# Patient Record
Sex: Female | Born: 1965 | Race: White | Hispanic: No | Marital: Single | Smoking: Current every day smoker
Health system: Southern US, Community
[De-identification: ages and names within clinical notes are randomized; demographics above are authoritative.]

## PROBLEM LIST (undated history)

## (undated) DIAGNOSIS — Z7901 Long term (current) use of anticoagulants: Secondary | ICD-10-CM

## (undated) DIAGNOSIS — J189 Pneumonia, unspecified organism: Secondary | ICD-10-CM

## (undated) DIAGNOSIS — I4892 Unspecified atrial flutter: Secondary | ICD-10-CM

## (undated) DIAGNOSIS — K219 Gastro-esophageal reflux disease without esophagitis: Secondary | ICD-10-CM

## (undated) DIAGNOSIS — J449 Chronic obstructive pulmonary disease, unspecified: Secondary | ICD-10-CM

## (undated) DIAGNOSIS — I495 Sick sinus syndrome: Secondary | ICD-10-CM

## (undated) DIAGNOSIS — I2510 Atherosclerotic heart disease of native coronary artery without angina pectoris: Secondary | ICD-10-CM

## (undated) DIAGNOSIS — Z9114 Patient's other noncompliance with medication regimen: Secondary | ICD-10-CM

## (undated) DIAGNOSIS — Z91148 Patient's other noncompliance with medication regimen for other reason: Secondary | ICD-10-CM

## (undated) DIAGNOSIS — E039 Hypothyroidism, unspecified: Secondary | ICD-10-CM

## (undated) DIAGNOSIS — K7460 Unspecified cirrhosis of liver: Secondary | ICD-10-CM

## (undated) DIAGNOSIS — R51 Headache: Secondary | ICD-10-CM

## (undated) DIAGNOSIS — E119 Type 2 diabetes mellitus without complications: Secondary | ICD-10-CM

## (undated) DIAGNOSIS — G8929 Other chronic pain: Secondary | ICD-10-CM

## (undated) DIAGNOSIS — I4891 Unspecified atrial fibrillation: Secondary | ICD-10-CM

## (undated) DIAGNOSIS — F419 Anxiety disorder, unspecified: Secondary | ICD-10-CM

## (undated) DIAGNOSIS — I2699 Other pulmonary embolism without acute cor pulmonale: Secondary | ICD-10-CM

## (undated) DIAGNOSIS — I429 Cardiomyopathy, unspecified: Secondary | ICD-10-CM

## (undated) DIAGNOSIS — B1920 Unspecified viral hepatitis C without hepatic coma: Secondary | ICD-10-CM

## (undated) HISTORY — PX: CHOLECYSTECTOMY: SHX55

## (undated) HISTORY — PX: CARDIAC ELECTROPHYSIOLOGY STUDY AND ABLATION: SHX1294

## (undated) HISTORY — PX: INSERT / REPLACE / REMOVE PACEMAKER: SUR710

---

## 2002-08-14 ENCOUNTER — Emergency Department (HOSPITAL_COMMUNITY): Admission: EM | Admit: 2002-08-14 | Discharge: 2002-08-14 | Payer: Self-pay | Admitting: Emergency Medicine

## 2008-12-07 HISTORY — PX: ESOPHAGOGASTRODUODENOSCOPY: SHX1529

## 2008-12-24 ENCOUNTER — Inpatient Hospital Stay (HOSPITAL_COMMUNITY): Admission: AD | Admit: 2008-12-24 | Discharge: 2008-12-28 | Payer: Self-pay | Admitting: Cardiology

## 2008-12-24 ENCOUNTER — Encounter: Payer: Self-pay | Admitting: Emergency Medicine

## 2008-12-24 ENCOUNTER — Ambulatory Visit: Payer: Self-pay | Admitting: Cardiology

## 2008-12-25 ENCOUNTER — Encounter: Payer: Self-pay | Admitting: Cardiology

## 2009-01-02 ENCOUNTER — Inpatient Hospital Stay (HOSPITAL_COMMUNITY): Admission: EM | Admit: 2009-01-02 | Discharge: 2009-01-09 | Payer: Self-pay | Admitting: Emergency Medicine

## 2009-01-02 ENCOUNTER — Ambulatory Visit: Payer: Self-pay | Admitting: Cardiology

## 2009-01-05 ENCOUNTER — Ambulatory Visit: Payer: Self-pay | Admitting: Gastroenterology

## 2009-01-06 ENCOUNTER — Encounter: Payer: Self-pay | Admitting: Gastroenterology

## 2009-01-14 ENCOUNTER — Emergency Department (HOSPITAL_COMMUNITY): Admission: EM | Admit: 2009-01-14 | Discharge: 2009-01-14 | Payer: Self-pay | Admitting: Emergency Medicine

## 2009-01-15 ENCOUNTER — Ambulatory Visit: Payer: Self-pay | Admitting: Cardiology

## 2009-01-15 ENCOUNTER — Encounter (INDEPENDENT_AMBULATORY_CARE_PROVIDER_SITE_OTHER): Payer: Self-pay | Admitting: Family Medicine

## 2009-01-15 ENCOUNTER — Inpatient Hospital Stay (HOSPITAL_COMMUNITY): Admission: EM | Admit: 2009-01-15 | Discharge: 2009-01-22 | Payer: Self-pay | Admitting: Emergency Medicine

## 2009-01-26 ENCOUNTER — Inpatient Hospital Stay (HOSPITAL_COMMUNITY): Admission: EM | Admit: 2009-01-26 | Discharge: 2009-01-29 | Payer: Self-pay | Admitting: Emergency Medicine

## 2009-07-22 ENCOUNTER — Encounter: Payer: Self-pay | Admitting: *Deleted

## 2009-09-26 ENCOUNTER — Encounter: Payer: Self-pay | Admitting: Cardiology

## 2011-03-23 LAB — POCT CARDIAC MARKERS
CKMB, poc: 2.4 ng/mL (ref 1.0–8.0)
CKMB, poc: 4 ng/mL (ref 1.0–8.0)
Myoglobin, poc: 54.8 ng/mL (ref 12–200)
Troponin i, poc: 0.11 ng/mL — ABNORMAL HIGH (ref 0.00–0.09)
Troponin i, poc: 0.11 ng/mL — ABNORMAL HIGH (ref 0.00–0.09)
Troponin i, poc: 0.11 ng/mL — ABNORMAL HIGH (ref 0.00–0.09)

## 2011-03-23 LAB — BASIC METABOLIC PANEL
BUN: 12 mg/dL (ref 6–23)
BUN: 3 mg/dL — ABNORMAL LOW (ref 6–23)
BUN: 5 mg/dL — ABNORMAL LOW (ref 6–23)
BUN: 7 mg/dL (ref 6–23)
CO2: 23 mEq/L (ref 19–32)
CO2: 25 mEq/L (ref 19–32)
CO2: 27 mEq/L (ref 19–32)
Calcium: 8.4 mg/dL (ref 8.4–10.5)
Calcium: 8.5 mg/dL (ref 8.4–10.5)
Calcium: 8.6 mg/dL (ref 8.4–10.5)
Chloride: 102 mEq/L (ref 96–112)
Chloride: 103 mEq/L (ref 96–112)
Chloride: 105 mEq/L (ref 96–112)
Chloride: 106 mEq/L (ref 96–112)
Chloride: 98 mEq/L (ref 96–112)
Creatinine, Ser: 0.83 mg/dL (ref 0.4–1.2)
Creatinine, Ser: 0.93 mg/dL (ref 0.4–1.2)
Creatinine, Ser: 1.02 mg/dL (ref 0.4–1.2)
GFR calc Af Amer: >60 mL/min (ref >60)
GFR calc Af Amer: >60 mL/min (ref >60)
GFR calc Af Amer: >60 mL/min (ref >60)
GFR calc Af Amer: >60 mL/min (ref >60)
GFR calc Af Amer: >60 mL/min (ref >60)
GFR calc non Af Amer: 59 mL/min — ABNORMAL LOW (ref >60)
GFR calc non Af Amer: >60 mL/min (ref >60)
GFR calc non Af Amer: >60 mL/min (ref >60)
GFR calc non Af Amer: >60 mL/min (ref >60)
GFR calc non Af Amer: >60 mL/min (ref >60)
Glucose, Bld: 92 mg/dL (ref 70–99)
Glucose, Bld: 93 mg/dL (ref 70–99)
Glucose, Bld: 94 mg/dL (ref 70–99)
Potassium: 3.4 mEq/L — ABNORMAL LOW (ref 3.5–5.1)
Potassium: 3.9 mEq/L (ref 3.5–5.1)
Potassium: 3.9 mEq/L (ref 3.5–5.1)
Potassium: 4 mEq/L (ref 3.5–5.1)
Potassium: 4 mEq/L (ref 3.5–5.1)
Sodium: 137 mEq/L (ref 135–145)
Sodium: 138 mEq/L (ref 135–145)
Sodium: 139 mEq/L (ref 135–145)

## 2011-03-23 LAB — CARDIAC PANEL(CRET KIN+CKTOT+MB+TROPI)
CK, MB: 5 ng/mL — ABNORMAL HIGH (ref 0.3–4.0)
CK, MB: 5.3 ng/mL — ABNORMAL HIGH (ref 0.3–4.0)
Relative Index: INVALID (ref 0.0–2.5)
Relative Index: INVALID (ref 0.0–2.5)
Total CK: 48 U/L (ref 7–177)
Total CK: 62 U/L (ref 7–177)
Total CK: 79 U/L (ref 7–177)
Troponin I: 0.13 ng/mL — ABNORMAL HIGH (ref 0.00–0.06)
Troponin I: 0.14 ng/mL — ABNORMAL HIGH (ref 0.00–0.06)

## 2011-03-23 LAB — LIPASE, BLOOD
Lipase: 22 U/L (ref 11–59)
Lipase: 38 U/L (ref 11–59)

## 2011-03-23 LAB — URINALYSIS, ROUTINE W REFLEX MICROSCOPIC
Hgb urine dipstick: NEGATIVE
Nitrite: POSITIVE — AB
Protein, ur: 30 mg/dL — AB
Specific Gravity, Urine: 1.031 — ABNORMAL HIGH (ref 1.005–1.030)
Urobilinogen, UA: 2 mg/dL — ABNORMAL HIGH (ref 0.0–1.0)

## 2011-03-23 LAB — DIFFERENTIAL
Basophils Absolute: 0.1 10*3/uL (ref 0.0–0.1)
Basophils Relative: 1 % (ref 0–1)
Eosinophils Absolute: 0 10*3/uL (ref 0.0–0.7)
Eosinophils Absolute: 0.1 10*3/uL (ref 0.0–0.7)
Eosinophils Relative: 0 % (ref 0–5)
Lymphocytes Relative: 18 % (ref 12–46)
Lymphocytes Relative: 31 % (ref 12–46)
Lymphs Abs: 2.3 10*3/uL (ref 0.7–4.0)
Lymphs Abs: 3.1 10*3/uL (ref 0.7–4.0)
Monocytes Absolute: 0.7 10*3/uL (ref 0.1–1.0)
Monocytes Relative: 7 % (ref 3–12)
Monocytes Relative: 8 % (ref 3–12)
Neutro Abs: 6.1 10*3/uL (ref 1.7–7.7)
Neutrophils Relative %: 61 % (ref 43–77)
Neutrophils Relative %: 73 % (ref 43–77)

## 2011-03-23 LAB — HEPATIC FUNCTION PANEL
ALT: 14 U/L (ref 0–35)
ALT: 42 U/L — ABNORMAL HIGH (ref 0–35)
AST: 18 U/L (ref 0–37)
Albumin: 3.3 g/dL — ABNORMAL LOW (ref 3.5–5.2)
Alkaline Phosphatase: 30 U/L — ABNORMAL LOW (ref 39–117)
Bilirubin, Direct: 0.4 mg/dL — ABNORMAL HIGH (ref 0.0–0.3)
Indirect Bilirubin: 0.4 mg/dL (ref 0.3–0.9)
Indirect Bilirubin: 0.8 mg/dL (ref 0.3–0.9)
Total Protein: 6.2 g/dL (ref 6.0–8.3)

## 2011-03-23 LAB — CBC
HCT: 35.8 % — ABNORMAL LOW (ref 36.0–46.0)
HCT: 35.8 % — ABNORMAL LOW (ref 36.0–46.0)
HCT: 38.2 % (ref 36.0–46.0)
HCT: 42.4 % (ref 36.0–46.0)
Hemoglobin: 11.8 g/dL — ABNORMAL LOW (ref 12.0–15.0)
Hemoglobin: 11.8 g/dL — ABNORMAL LOW (ref 12.0–15.0)
Hemoglobin: 11.9 g/dL — ABNORMAL LOW (ref 12.0–15.0)
Hemoglobin: 11.9 g/dL — ABNORMAL LOW (ref 12.0–15.0)
Hemoglobin: 12.5 g/dL (ref 12.0–15.0)
Hemoglobin: 13.9 g/dL (ref 12.0–15.0)
MCHC: 32.7 g/dL (ref 30.0–36.0)
MCHC: 32.7 g/dL (ref 30.0–36.0)
MCHC: 32.9 g/dL (ref 30.0–36.0)
MCHC: 32.9 g/dL (ref 30.0–36.0)
MCHC: 33.2 g/dL (ref 30.0–36.0)
MCHC: 33.3 g/dL (ref 30.0–36.0)
MCV: 100.2 fL — ABNORMAL HIGH (ref 78.0–100.0)
MCV: 100.8 fL — ABNORMAL HIGH (ref 78.0–100.0)
MCV: 101.4 fL — ABNORMAL HIGH (ref 78.0–100.0)
MCV: 101.4 fL — ABNORMAL HIGH (ref 78.0–100.0)
MCV: 101.6 fL — ABNORMAL HIGH (ref 78.0–100.0)
MCV: 101.6 fL — ABNORMAL HIGH (ref 78.0–100.0)
MCV: 101.9 fL — ABNORMAL HIGH (ref 78.0–100.0)
MCV: 102.4 fL — ABNORMAL HIGH (ref 78.0–100.0)
Platelets: 185 10*3/uL (ref 150–400)
Platelets: 190 10*3/uL (ref 150–400)
Platelets: 201 10*3/uL (ref 150–400)
Platelets: 230 10*3/uL (ref 150–400)
Platelets: 267 10*3/uL (ref 150–400)
Platelets: 280 10*3/uL (ref 150–400)
RBC: 3.52 MIL/uL — ABNORMAL LOW (ref 3.87–5.11)
RBC: 3.53 MIL/uL — ABNORMAL LOW (ref 3.87–5.11)
RBC: 3.53 MIL/uL — ABNORMAL LOW (ref 3.87–5.11)
RBC: 3.54 MIL/uL — ABNORMAL LOW (ref 3.87–5.11)
RBC: 3.76 MIL/uL — ABNORMAL LOW (ref 3.87–5.11)
RBC: 3.76 MIL/uL — ABNORMAL LOW (ref 3.87–5.11)
RBC: 4.2 MIL/uL (ref 3.87–5.11)
RDW: 15.3 % (ref 11.5–15.5)
RDW: 16 % — ABNORMAL HIGH (ref 11.5–15.5)
RDW: 16 % — ABNORMAL HIGH (ref 11.5–15.5)
RDW: 16.1 % — ABNORMAL HIGH (ref 11.5–15.5)
WBC: 10 10*3/uL (ref 4.0–10.5)
WBC: 12.9 10*3/uL — ABNORMAL HIGH (ref 4.0–10.5)
WBC: 6.4 10*3/uL (ref 4.0–10.5)
WBC: 6.9 10*3/uL (ref 4.0–10.5)
WBC: 8.2 10*3/uL (ref 4.0–10.5)
WBC: 8.4 10*3/uL (ref 4.0–10.5)
WBC: 8.4 10*3/uL (ref 4.0–10.5)
WBC: 8.8 10*3/uL (ref 4.0–10.5)

## 2011-03-23 LAB — BRAIN NATRIURETIC PEPTIDE
Pro B Natriuretic peptide (BNP): 242 pg/mL — ABNORMAL HIGH (ref 0.0–100.0)
Pro B Natriuretic peptide (BNP): 965 pg/mL — ABNORMAL HIGH (ref 0.0–100.0)

## 2011-03-23 LAB — PROTIME-INR
INR: 1.1 (ref 0.00–1.49)
INR: 1.1 (ref 0.00–1.49)
INR: 1.1 (ref 0.00–1.49)
INR: 1.2 (ref 0.00–1.49)
INR: 1.4 (ref 0.00–1.49)
INR: 1.6 — ABNORMAL HIGH (ref 0.00–1.49)
INR: 2.2 — ABNORMAL HIGH (ref 0.00–1.49)
Prothrombin Time: 14 seconds (ref 11.6–15.2)
Prothrombin Time: 15 s (ref 11.6–15.2)
Prothrombin Time: 16 seconds — ABNORMAL HIGH (ref 11.6–15.2)
Prothrombin Time: 17.5 seconds — ABNORMAL HIGH (ref 11.6–15.2)
Prothrombin Time: 25.4 seconds — ABNORMAL HIGH (ref 11.6–15.2)
Prothrombin Time: 25.9 seconds — ABNORMAL HIGH (ref 11.6–15.2)

## 2011-03-23 LAB — PLATELET COUNT: Platelets: 203 10*3/uL (ref 150–400)

## 2011-03-23 LAB — HEPARIN LEVEL (UNFRACTIONATED)
Heparin Unfractionated: 0.23 IU/mL — ABNORMAL LOW (ref 0.30–0.70)
Heparin Unfractionated: 0.29 IU/mL — ABNORMAL LOW (ref 0.30–0.70)
Heparin Unfractionated: 0.37 IU/mL (ref 0.30–0.70)
Heparin Unfractionated: <0.1 IU/mL — ABNORMAL LOW (ref 0.30–0.70)
Heparin Unfractionated: <0.1 IU/mL — ABNORMAL LOW (ref 0.30–0.70)

## 2011-03-23 LAB — URINE MICROSCOPIC-ADD ON

## 2011-03-23 LAB — CK TOTAL AND CKMB (NOT AT ARMC)
CK, MB: 9.5 ng/mL — ABNORMAL HIGH (ref 0.3–4.0)
Relative Index: INVALID (ref 0.0–2.5)
Total CK: 69 U/L (ref 7–177)

## 2011-03-23 LAB — COMPREHENSIVE METABOLIC PANEL
ALT: 19 U/L (ref 0–35)
ALT: 87 U/L — ABNORMAL HIGH (ref 0–35)
AST: 142 U/L — ABNORMAL HIGH (ref 0–37)
AST: 91 U/L — ABNORMAL HIGH (ref 0–37)
Albumin: 3.4 g/dL — ABNORMAL LOW (ref 3.5–5.2)
Albumin: 3.6 g/dL (ref 3.5–5.2)
Alkaline Phosphatase: 36 U/L — ABNORMAL LOW (ref 39–117)
BUN: 3 mg/dL — ABNORMAL LOW (ref 6–23)
CO2: 29 mEq/L (ref 19–32)
Calcium: 8.1 mg/dL — ABNORMAL LOW (ref 8.4–10.5)
Calcium: 8.3 mg/dL — ABNORMAL LOW (ref 8.4–10.5)
Calcium: 8.5 mg/dL (ref 8.4–10.5)
Creatinine, Ser: 0.87 mg/dL (ref 0.4–1.2)
Creatinine, Ser: 1.07 mg/dL (ref 0.4–1.2)
GFR calc Af Amer: >60 mL/min (ref >60)
GFR calc Af Amer: >60 mL/min (ref >60)
GFR calc non Af Amer: >60 mL/min (ref >60)
Glucose, Bld: 79 mg/dL (ref 70–99)
Glucose, Bld: 97 mg/dL (ref 70–99)
Potassium: 4.3 mEq/L (ref 3.5–5.1)
Sodium: 134 mEq/L — ABNORMAL LOW (ref 135–145)
Sodium: 137 mEq/L (ref 135–145)
Sodium: 137 mEq/L (ref 135–145)
Total Protein: 6.4 g/dL (ref 6.0–8.3)
Total Protein: 7.1 g/dL (ref 6.0–8.3)

## 2011-03-23 LAB — HEPATITIS PANEL, ACUTE
HCV Ab: REACTIVE — AB
Hep B C IgM: NEGATIVE
Hepatitis B Surface Ag: NEGATIVE

## 2011-03-23 LAB — LIPID PANEL
Cholesterol: 114 mg/dL (ref 0–200)
HDL: 26 mg/dL — ABNORMAL LOW (ref >39)

## 2011-03-23 LAB — TSH: TSH: 5.373 u[IU]/mL — ABNORMAL HIGH (ref 0.350–4.500)

## 2011-03-23 LAB — APTT
aPTT: 26 seconds (ref 24–37)
aPTT: 29 seconds (ref 24–37)

## 2011-03-23 LAB — DIGOXIN LEVEL
Digoxin Level: <0.2 ng/mL — ABNORMAL LOW (ref 0.8–2.0)
Digoxin Level: <0.2 ng/mL — ABNORMAL LOW (ref 0.8–2.0)

## 2011-03-23 LAB — HCV RNA QUANT
HCV Quantitative Log: 6.55 {Log} — ABNORMAL HIGH (ref <1.63)
HCV Quantitative: 3570000 IU/mL — ABNORMAL HIGH (ref <43)

## 2011-03-23 LAB — D-DIMER, QUANTITATIVE: D-Dimer, Quant: 1.98 ug/mL-FEU — ABNORMAL HIGH (ref 0.00–0.48)

## 2011-03-24 LAB — AMMONIA: Ammonia: 10 umol/L — ABNORMAL LOW (ref 11–35)

## 2011-03-24 LAB — DIFFERENTIAL
Basophils Absolute: 0 10*3/uL (ref 0.0–0.1)
Basophils Absolute: 0 10*3/uL (ref 0.0–0.1)
Basophils Absolute: 0 10*3/uL (ref 0.0–0.1)
Basophils Absolute: 0.1 10*3/uL (ref 0.0–0.1)
Basophils Absolute: 0.1 10*3/uL (ref 0.0–0.1)
Basophils Absolute: 0 10*3/uL (ref 0.0–0.1)
Basophils Absolute: 0.1 10*3/uL (ref 0.0–0.1)
Basophils Relative: 0 % (ref 0–1)
Basophils Relative: 1 % (ref 0–1)
Basophils Relative: 0 % (ref 0–1)
Basophils Relative: 0 % (ref 0–1)
Eosinophils Absolute: 0 10*3/uL (ref 0.0–0.7)
Eosinophils Absolute: 0.2 10*3/uL (ref 0.0–0.7)
Eosinophils Absolute: 0 10*3/uL (ref 0.0–0.7)
Eosinophils Relative: 0 % (ref 0–5)
Eosinophils Relative: 1 % (ref 0–5)
Eosinophils Relative: 1 % (ref 0–5)
Eosinophils Relative: 1 % (ref 0–5)
Eosinophils Relative: 1 % (ref 0–5)
Eosinophils Relative: 2 % (ref 0–5)
Eosinophils Relative: 2 % (ref 0–5)
Eosinophils Relative: 0 % (ref 0–5)
Eosinophils Relative: 1 % (ref 0–5)
Lymphocytes Relative: 21 % (ref 12–46)
Lymphocytes Relative: 23 % (ref 12–46)
Lymphocytes Relative: 30 % (ref 12–46)
Lymphocytes Relative: 30 % (ref 12–46)
Lymphocytes Relative: 31 % (ref 12–46)
Lymphocytes Relative: 32 % (ref 12–46)
Lymphocytes Relative: 18 % (ref 12–46)
Lymphs Abs: 3 10*3/uL (ref 0.7–4.0)
Lymphs Abs: 3.7 10*3/uL (ref 0.7–4.0)
Monocytes Absolute: 1 10*3/uL (ref 0.1–1.0)
Monocytes Absolute: 1.5 10*3/uL — ABNORMAL HIGH (ref 0.1–1.0)
Monocytes Absolute: 0.9 10*3/uL (ref 0.1–1.0)
Monocytes Absolute: 0.9 10*3/uL (ref 0.1–1.0)
Monocytes Relative: 10 % (ref 3–12)
Monocytes Relative: 11 % (ref 3–12)
Monocytes Relative: 9 % (ref 3–12)
Monocytes Relative: 7 % (ref 3–12)
Monocytes Relative: 8 % (ref 3–12)
Neutro Abs: 10.8 10*3/uL — ABNORMAL HIGH (ref 1.7–7.7)
Neutro Abs: 6.2 10*3/uL (ref 1.7–7.7)
Neutro Abs: 6.7 10*3/uL (ref 1.7–7.7)
Neutro Abs: 9.1 10*3/uL — ABNORMAL HIGH (ref 1.7–7.7)
Neutro Abs: 8.3 10*3/uL — ABNORMAL HIGH (ref 1.7–7.7)
Neutrophils Relative %: 60 % (ref 43–77)
Neutrophils Relative %: 69 % (ref 43–77)
Neutrophils Relative %: 75 % (ref 43–77)

## 2011-03-24 LAB — CK TOTAL AND CKMB (NOT AT ARMC)
CK, MB: 6 ng/mL — ABNORMAL HIGH (ref 0.3–4.0)
CK, MB: 6.2 ng/mL — ABNORMAL HIGH (ref 0.3–4.0)
Relative Index: INVALID (ref 0.0–2.5)
Total CK: 55 U/L (ref 7–177)
Total CK: 62 U/L (ref 7–177)

## 2011-03-24 LAB — BASIC METABOLIC PANEL
BUN: 11 mg/dL (ref 6–23)
BUN: 17 mg/dL (ref 6–23)
BUN: 19 mg/dL (ref 6–23)
BUN: 9 mg/dL (ref 6–23)
BUN: 17 mg/dL (ref 6–23)
BUN: 7 mg/dL (ref 6–23)
CO2: 24 mEq/L (ref 19–32)
CO2: 25 mEq/L (ref 19–32)
CO2: 35 mEq/L — ABNORMAL HIGH (ref 19–32)
CO2: 26 mEq/L (ref 19–32)
CO2: 33 mEq/L — ABNORMAL HIGH (ref 19–32)
Calcium: 8.5 mg/dL (ref 8.4–10.5)
Calcium: 8.9 mg/dL (ref 8.4–10.5)
Calcium: 9 mg/dL (ref 8.4–10.5)
Calcium: 9.2 mg/dL (ref 8.4–10.5)
Calcium: 8.6 mg/dL (ref 8.4–10.5)
Chloride: 101 mEq/L (ref 96–112)
Chloride: 102 mEq/L (ref 96–112)
Chloride: 98 mEq/L (ref 96–112)
Chloride: 106 mEq/L (ref 96–112)
Chloride: 93 mEq/L — ABNORMAL LOW (ref 96–112)
Creatinine, Ser: 0.95 mg/dL (ref 0.4–1.2)
Creatinine, Ser: 1.07 mg/dL (ref 0.4–1.2)
Creatinine, Ser: 0.86 mg/dL (ref 0.4–1.2)
Creatinine, Ser: 1.32 mg/dL — ABNORMAL HIGH (ref 0.4–1.2)
GFR calc Af Amer: >60 mL/min (ref >60)
GFR calc Af Amer: >60 mL/min (ref >60)
GFR calc Af Amer: >60 mL/min (ref >60)
GFR calc Af Amer: >60 mL/min (ref >60)
GFR calc non Af Amer: 51 mL/min — ABNORMAL LOW (ref >60)
GFR calc non Af Amer: 55 mL/min — ABNORMAL LOW (ref >60)
GFR calc non Af Amer: 56 mL/min — ABNORMAL LOW (ref >60)
GFR calc non Af Amer: >60 mL/min (ref >60)
GFR calc non Af Amer: >60 mL/min (ref >60)
GFR calc non Af Amer: >60 mL/min (ref >60)
GFR calc non Af Amer: >60 mL/min (ref >60)
Glucose, Bld: 110 mg/dL — ABNORMAL HIGH (ref 70–99)
Glucose, Bld: 122 mg/dL — ABNORMAL HIGH (ref 70–99)
Glucose, Bld: 132 mg/dL — ABNORMAL HIGH (ref 70–99)
Glucose, Bld: 165 mg/dL — ABNORMAL HIGH (ref 70–99)
Glucose, Bld: 202 mg/dL — ABNORMAL HIGH (ref 70–99)
Glucose, Bld: 217 mg/dL — ABNORMAL HIGH (ref 70–99)
Glucose, Bld: 103 mg/dL — ABNORMAL HIGH (ref 70–99)
Glucose, Bld: 159 mg/dL — ABNORMAL HIGH (ref 70–99)
Potassium: 3.6 mEq/L (ref 3.5–5.1)
Potassium: 3.9 mEq/L (ref 3.5–5.1)
Potassium: 4.2 mEq/L (ref 3.5–5.1)
Potassium: 4.7 mEq/L (ref 3.5–5.1)
Potassium: 3.7 mEq/L (ref 3.5–5.1)
Potassium: 3.8 mEq/L (ref 3.5–5.1)
Potassium: 3.8 mEq/L (ref 3.5–5.1)
Sodium: 136 mEq/L (ref 135–145)
Sodium: 136 mEq/L (ref 135–145)
Sodium: 138 mEq/L (ref 135–145)
Sodium: 140 mEq/L (ref 135–145)
Sodium: 140 mEq/L (ref 135–145)

## 2011-03-24 LAB — CBC
HCT: 35.7 % — ABNORMAL LOW (ref 36.0–46.0)
HCT: 35.8 % — ABNORMAL LOW (ref 36.0–46.0)
HCT: 36.9 % (ref 36.0–46.0)
HCT: 37.2 % (ref 36.0–46.0)
HCT: 38.1 % (ref 36.0–46.0)
HCT: 33.6 % — ABNORMAL LOW (ref 36.0–46.0)
Hemoglobin: 12.6 g/dL (ref 12.0–15.0)
Hemoglobin: 13 g/dL (ref 12.0–15.0)
Hemoglobin: 11.3 g/dL — ABNORMAL LOW (ref 12.0–15.0)
Hemoglobin: 13 g/dL (ref 12.0–15.0)
MCHC: 33.4 g/dL (ref 30.0–36.0)
MCHC: 33.7 g/dL (ref 30.0–36.0)
MCHC: 33.8 g/dL (ref 30.0–36.0)
MCHC: 34 g/dL (ref 30.0–36.0)
MCHC: 34 g/dL (ref 30.0–36.0)
MCHC: 34.1 g/dL (ref 30.0–36.0)
MCHC: 33.4 g/dL (ref 30.0–36.0)
MCHC: 33.6 g/dL (ref 30.0–36.0)
MCV: 97.6 fL (ref 78.0–100.0)
MCV: 98.6 fL (ref 78.0–100.0)
MCV: 99.3 fL (ref 78.0–100.0)
MCV: 98.2 fL (ref 78.0–100.0)
Platelets: 215 10*3/uL (ref 150–400)
Platelets: 217 10*3/uL (ref 150–400)
Platelets: 229 10*3/uL (ref 150–400)
Platelets: 273 10*3/uL (ref 150–400)
Platelets: 184 10*3/uL (ref 150–400)
RBC: 3.72 MIL/uL — ABNORMAL LOW (ref 3.87–5.11)
RBC: 3.74 MIL/uL — ABNORMAL LOW (ref 3.87–5.11)
RBC: 3.79 MIL/uL — ABNORMAL LOW (ref 3.87–5.11)
RBC: 3.93 MIL/uL (ref 3.87–5.11)
RBC: 3.38 MIL/uL — ABNORMAL LOW (ref 3.87–5.11)
RBC: 3.98 MIL/uL (ref 3.87–5.11)
RDW: 15 % (ref 11.5–15.5)
RDW: 15.2 % (ref 11.5–15.5)
RDW: 15.4 % (ref 11.5–15.5)
RDW: 15.6 % — ABNORMAL HIGH (ref 11.5–15.5)
RDW: 15.6 % — ABNORMAL HIGH (ref 11.5–15.5)
RDW: 16 % — ABNORMAL HIGH (ref 11.5–15.5)
RDW: 16.2 % — ABNORMAL HIGH (ref 11.5–15.5)
WBC: 10.4 10*3/uL (ref 4.0–10.5)
WBC: 13.3 10*3/uL — ABNORMAL HIGH (ref 4.0–10.5)
WBC: 20.4 10*3/uL — ABNORMAL HIGH (ref 4.0–10.5)

## 2011-03-24 LAB — HEPATIC FUNCTION PANEL
AST: 41 U/L — ABNORMAL HIGH (ref 0–37)
Albumin: 3.4 g/dL — ABNORMAL LOW (ref 3.5–5.2)
Alkaline Phosphatase: 39 U/L (ref 39–117)
Bilirubin, Direct: 0.3 mg/dL (ref 0.0–0.3)
Total Bilirubin: 0.8 mg/dL (ref 0.3–1.2)

## 2011-03-24 LAB — URINALYSIS, ROUTINE W REFLEX MICROSCOPIC
Bilirubin Urine: NEGATIVE
Bilirubin Urine: NEGATIVE
Glucose, UA: NEGATIVE mg/dL
Hgb urine dipstick: NEGATIVE
Hgb urine dipstick: NEGATIVE
Leukocytes, UA: NEGATIVE
Nitrite: NEGATIVE
Protein, ur: 100 mg/dL — AB
Specific Gravity, Urine: 1.008 (ref 1.005–1.030)
Specific Gravity, Urine: 1.029 (ref 1.005–1.030)
Specific Gravity, Urine: 1.015 (ref 1.005–1.030)
pH: 5.5 (ref 5.0–8.0)
pH: 6.5 (ref 5.0–8.0)
pH: 6 (ref 5.0–8.0)

## 2011-03-24 LAB — PROTIME-INR
INR: 1.2 (ref 0.00–1.49)
INR: 1.9 — ABNORMAL HIGH (ref 0.00–1.49)
INR: 3.2 — ABNORMAL HIGH (ref 0.00–1.49)
INR: 1.1 (ref 0.00–1.49)
Prothrombin Time: 15 seconds (ref 11.6–15.2)
Prothrombin Time: 15.1 seconds (ref 11.6–15.2)
Prothrombin Time: 19.1 seconds — ABNORMAL HIGH (ref 11.6–15.2)
Prothrombin Time: 22.2 seconds — ABNORMAL HIGH (ref 11.6–15.2)
Prothrombin Time: 23 seconds — ABNORMAL HIGH (ref 11.6–15.2)
Prothrombin Time: 27.3 seconds — ABNORMAL HIGH (ref 11.6–15.2)
Prothrombin Time: 35 seconds — ABNORMAL HIGH (ref 11.6–15.2)
Prothrombin Time: 14.1 seconds (ref 11.6–15.2)
Prothrombin Time: 14.9 seconds (ref 11.6–15.2)

## 2011-03-24 LAB — COMPREHENSIVE METABOLIC PANEL
ALT: 27 U/L (ref 0–35)
ALT: 21 U/L (ref 0–35)
ALT: 25 U/L (ref 0–35)
AST: 28 U/L (ref 0–37)
AST: 31 U/L (ref 0–37)
AST: 44 U/L — ABNORMAL HIGH (ref 0–37)
Albumin: 3.5 g/dL (ref 3.5–5.2)
Albumin: 3.2 g/dL — ABNORMAL LOW (ref 3.5–5.2)
Alkaline Phosphatase: 37 U/L — ABNORMAL LOW (ref 39–117)
BUN: 14 mg/dL (ref 6–23)
BUN: 11 mg/dL (ref 6–23)
CO2: 34 mEq/L — ABNORMAL HIGH (ref 19–32)
CO2: 23 mEq/L (ref 19–32)
Calcium: 8.7 mg/dL (ref 8.4–10.5)
Calcium: 9 mg/dL (ref 8.4–10.5)
Chloride: 91 mEq/L — ABNORMAL LOW (ref 96–112)
Chloride: 94 mEq/L — ABNORMAL LOW (ref 96–112)
Creatinine, Ser: 0.96 mg/dL (ref 0.4–1.2)
Creatinine, Ser: 1.04 mg/dL (ref 0.4–1.2)
Creatinine, Ser: 0.94 mg/dL (ref 0.4–1.2)
GFR calc Af Amer: >60 mL/min (ref >60)
GFR calc Af Amer: >60 mL/min (ref >60)
GFR calc Af Amer: >60 mL/min (ref >60)
GFR calc non Af Amer: 58 mL/min — ABNORMAL LOW (ref >60)
GFR calc non Af Amer: >60 mL/min (ref >60)
GFR calc non Af Amer: >60 mL/min (ref >60)
Glucose, Bld: 122 mg/dL — ABNORMAL HIGH (ref 70–99)
Glucose, Bld: 137 mg/dL — ABNORMAL HIGH (ref 70–99)
Potassium: 3.4 mEq/L — ABNORMAL LOW (ref 3.5–5.1)
Sodium: 138 mEq/L (ref 135–145)
Sodium: 136 mEq/L (ref 135–145)
Total Bilirubin: 1.2 mg/dL (ref 0.3–1.2)
Total Bilirubin: 2.5 mg/dL — ABNORMAL HIGH (ref 0.3–1.2)
Total Protein: 6.7 g/dL (ref 6.0–8.3)
Total Protein: 6.4 g/dL (ref 6.0–8.3)

## 2011-03-24 LAB — DIGOXIN LEVEL: Digoxin Level: <0.2 ng/mL — ABNORMAL LOW (ref 0.8–2.0)

## 2011-03-24 LAB — HEPARIN LEVEL (UNFRACTIONATED)
Heparin Unfractionated: 0.1 IU/mL — ABNORMAL LOW (ref 0.30–0.70)
Heparin Unfractionated: 0.41 IU/mL (ref 0.30–0.70)

## 2011-03-24 LAB — POCT CARDIAC MARKERS
CKMB, poc: 3.1 ng/mL (ref 1.0–8.0)
Myoglobin, poc: 41.7 ng/mL (ref 12–200)
Myoglobin, poc: 41.8 ng/mL (ref 12–200)
Myoglobin, poc: 78.2 ng/mL (ref 12–200)
Troponin i, poc: 0.08 ng/mL (ref 0.00–0.09)
Troponin i, poc: <0.05 ng/mL (ref 0.00–0.09)

## 2011-03-24 LAB — BLOOD GAS, ARTERIAL
Patient temperature: 37
TCO2: 21.3 mmol/L (ref 0–100)
pH, Arterial: 7.314 — ABNORMAL LOW (ref 7.350–7.400)

## 2011-03-24 LAB — BRAIN NATRIURETIC PEPTIDE
Pro B Natriuretic peptide (BNP): 314 pg/mL — ABNORMAL HIGH (ref 0.0–100.0)
Pro B Natriuretic peptide (BNP): 324 pg/mL — ABNORMAL HIGH (ref 0.0–100.0)
Pro B Natriuretic peptide (BNP): 139 pg/mL — ABNORMAL HIGH (ref 0.0–100.0)
Pro B Natriuretic peptide (BNP): 271 pg/mL — ABNORMAL HIGH (ref 0.0–100.0)

## 2011-03-24 LAB — URINE MICROSCOPIC-ADD ON

## 2011-03-24 LAB — URINE CULTURE

## 2011-03-24 LAB — CULTURE, BLOOD (ROUTINE X 2)
Culture: NO GROWTH
Report Status: 2272010

## 2011-03-24 LAB — CARDIAC PANEL(CRET KIN+CKTOT+MB+TROPI)
CK, MB: 6.1 ng/mL — ABNORMAL HIGH (ref 0.3–4.0)
Relative Index: INVALID (ref 0.0–2.5)
Relative Index: INVALID (ref 0.0–2.5)
Total CK: 39 U/L (ref 7–177)
Troponin I: 0.14 ng/mL — ABNORMAL HIGH (ref 0.00–0.06)

## 2011-03-24 LAB — PHOSPHORUS: Phosphorus: 3 mg/dL (ref 2.3–4.6)

## 2011-03-24 LAB — MAGNESIUM: Magnesium: 1.8 mg/dL (ref 1.5–2.5)

## 2011-03-24 LAB — TSH: TSH: 2.205 u[IU]/mL (ref 0.350–4.500)

## 2011-04-21 NOTE — H&P (Signed)
[name], [name] NO.:  [id number]   MEDICAL RECORD NO.:  [id number]          PATIENT TYPE:  INP   LOCATION:  2607                         FACILITY:  [facility]   PHYSICIAN:  Marca Ancona, MD      DATE OF BIRTH:  December 01, 1966   DATE OF ADMISSION:  01/02/2009  DATE OF DISCHARGE:                              HISTORY & PHYSICAL   PRIMARY CARDIOLOGIST:  Gerrit Friends. Dietrich Pates, MD, [facility]   CHIEF COMPLAINT:  Chest pain.   HISTORY OF PRESENT ILLNESS:  This is a 45 year old female with a history  of atrial fibrillation, nonobstructive CAD, COPD presenting with atrial  fibrillation with RVR.  The patient was discharged on December 28, 2008,  after admission for AFib with RVR.  The patient's cardiac enzymes were  elevated, but cath showed nonobstructive coronary artery disease  (cardiac enzymes likely elevated secondary to demand ischemia).  The  patient is rate controlled on beta-blocker and anticoagulated on  Coumadin, which has recently been started at last hospitalization.  The  patient developed nausea and vomiting recently, not being able to keep  any food down over the last couple of days.  She has also developed  epigastric periumbilical abdominal pain and watery diarrhea.  Significantly decreased p.o. intake also.  Today, she arrived in the ER  after approximately 5 days of symptoms and found to be in AFib with RVR  to 140s-150s.   PAST MEDICAL HISTORY:  1. Atrial fibrillation.  2. Depression.  3. Tobacco abuse.  4. GERD.  5. COPD (on home O2).  6. Obesity.  7. Anxiety.  8. Cirrhosis of the liver.  9. EtOH abuse (past).  10.Hepatitis C.  11.Hypertension.  12.Peripheral vascular disease.  13.Chronic low back pain.  14.Morbid obesity.  15.Past echo January 2010 that showed EF approximately equal to 65%,      moderate LVH, moderate-to-severe left atrial enlargement.  16.Left heart cath showed nonobstructive coronary artery disease in      January 2010.   SOCIAL HISTORY:  The patient lives with some friends.  She is disabled.  She smokes 3-4 cigarettes a day, was a very heavy drinker until October  2009, has not drank since, no illicit drug use, no herbal medications,  no modified diet, no regular exercise.   FAMILY HISTORY:  Mother had a history of coronary artery disease younger  than 41 years of age.  Father had myocardial infarction at age 8 and  the patient's brother had history of coronary artery disease.   REVIEW OF SYSTEMS:  As described in HPI.   ALLERGIES:  VESICARE.   MEDICATIONS:  1. Coumadin 2.5 mg p.o. daily.  2. Paroxetine 20 mg p.o. daily.  3. Lanoxin 0.25 mg daily.  4. Aspirin 325 mg p.o. daily.  5. Nexium 40 mg p.o. daily.  6. Metoprolol 50 mg p.o. b.i.d.  7. Oxygen 2 L as needed for shortness of breath.  8. ProAir HFA 2 puffs q.6 h. p.r.n.  9. Vitamin B12 100 mcg p.o. daily.  10.Alprazolam 0.5 mg p.o. b.i.d. as needed.  11.Vicodin 5/500 mg 1-2 tablets  q.8 h. as needed for back pain.  12.Xopenex 0.45 mg 2 puffs q.6 h. p.r.n.  13.In the emergency department, she receiving 4 mg of IV Zofran and 10      mg IV push of diltiazem as well as 5 mg per hour diltiazem IV drip,      it was titrated up to 10 mg per hour.   PHYSICAL EXAMINATION:  VITAL SIGNS:  Temp 96.9 degrees Fahrenheit, BP  102/73, pulse 142, respiratory rate 30 unlabored, O2 saturation 100% on  2 L by nasal cannula.  GENERAL:  On exam, she showed mild distress.  Her head was  normocephalic, atraumatic.  Pupils were equal, round, and reactive to  light.  Extraocular muscles were intact.  Nares were patent without  significant discharge.  Oropharynx showed no significant erythema or  exudate.  NECK:  Supple without thyromegaly.  JVP approximately 8 cm.  No obvious  lymphadenopathy.  HEART:  Rate was irregular; however, audible S1 and S2, 1/6 systolic  murmur best heard at the left lower sternal border.  LUNGS:  Diffuse wheezing bilaterally.  Skin:   No rashes, lesions, or petechiae.  ABDOMEN:  Soft, tender epigastric to periumbilical region.  No  peritoneal signs.  No significant rebound or guarding.  No  hepatosplenomegaly.  EXTREMITIES:  No clubbing, cyanosis, or edema.  MUSCULOSKELETAL:  No significant joint deformity, effusions, no spinal  or CVA tenderness.  NEURO:  Cranial nerves II through XII are grossly intact.  Strength was  5/5 in all extremities and axial groups with normal sensation throughout  and normal cerebellar function.   IMAGING:  Chest x-ray showed persistent cardiomegaly with vascular  congestion, right base collapse/consolidation with associated small-to-  moderate right pleural effusion.   EKG showed atrial fibrillation with a rate of 68, left axis deviation,  no cigarette ST-T wave changes, no Q waves, no prior EKG to compare to,  QRS 112, QTc 448.   LABORATORY DATA:  Sodium 137, potassium 4.4, chloride 103, CO2 of 23,  BUN 12, creatinine 0.94, glucose 94, PTT 29, PT 17.5, INR 1.4.  First  set of cardiac markers mildly elevated with a troponin of 0.11, and CK-  MB of 4.0.   ASSESSMENT AND PLAN:  This is a 45 year old white female with a history  of atrial fibrillation, chronic obstructive pulmonary disease, and  nonobstructive coronary artery disease presenting with atrial  fibrillation and rapid ventricular response in the setting of abdominal  pain/tenderness, diarrhea, nausea, or vomiting.  1. Gastrointestinal symptoms, probably driving her atrial      fibrillation? pancreatitis (but denied ethyl alcohol and the      patient has undergone a cholecystectomy) or peptic ulcer disease.      Given diarrhea may be gastroenteritis.  Decreased p.o. intake and      decreased blood pressure - will give gentle IV fluids 75 mL per      hour x10 hours.  Will check abdominal and pelvis CT with p.o. and      IV contrast, check LFTs.  Continue Zofran as part of Cardiology      p.r.n. orders to combat nausea and  vomiting.  Will also do stool      cultures in care of infectious source of diarrhea.  2. Atrial fibrillation with rapid ventricular response.  Continue      diltiazem drip to control rate and will attempt to give p.o.      Coumadin per pharmacy.  3. Chronic obstructive pulmonary disease -  diffuse wheezes on lung      exam - Atrovent and Xopenex nebs now and q.6 h. thereafter.      Jarrett Ables, [facility]      Marca Ancona, MD  Electronically Signed    MS/MEDQ  D:  01/02/2009  T:  01/03/2009  Job:  [id number]

## 2011-04-21 NOTE — H&P (Signed)
[name], Kim [name] NO.:  [id number]   MEDICAL RECORD NO.:  [id number]          PATIENT TYPE:  INP   LOCATION:  2918                         FACILITY:  [facility]   PHYSICIAN:  Rollene Rotunda, MD, FACCDATE OF BIRTH:  1966-10-01   DATE OF ADMISSION:  12/24/2008  DATE OF DISCHARGE:                              HISTORY & PHYSICAL   PRIMARY CARDIOLOGIST:  New to [facility], being seen by Dr.  Antoine Poche.   PRIMARY CARE Maybel Dambrosio:  Dr. Gracy Bruins in [place].   PATIENT PROFILE:  A 45 year old Caucasian female with a history of  atrial fibrillation who presents with AFib with RVR and dyspnea.   PROBLEM LIST:  1. Atrial fibrillation with rapid ventricular response.      a.     Per patient diagnosed in October 2009.  Workup up to this       point, unclear.  2. Depression.  3. Ongoing tobacco abuse with a 20-pack a year history.  4. Gastroesophageal reflux disease.  5. Chronic obstructive pulmonary disease.  6. Anxiety.  7. Obesity.  8. Cholelithiasis, status post cholecystectomy, December 2009.  9. Ethyl alcohol abuse, quitting/cutting back in October 2009.  10.Questionable history of hepatitis C per the patient.  11.Low back pain.   HISTORY OF PRESENT ILLNESS:  A 45 year old Caucasian female with history  of AFib dating back at least to October 2009.  She says at that time she  presented to [facility] with fluttering with associated shortness  of breath, chest pain, diaphoresis, nausea, and coughing.  She has been  admitted to [facility] on multiple occasions since October 2009, saying  greater than 10 times.  She is uncertain of therapies or diagnostic  tests, but says that each time she was in the hospital for about a week.  She was last discharged just over a week ago.  She stated that at that  time she was again admitted for AFib and dyspnea and at the time of  discharge, she continued to have tachy palpitations, dyspnea, lower  extremity edema,  and reproducible chest pain.  These symptoms have  persisted over the past week and then last evening she was walking in  the house where she stays and lost her balance and fell without loss of  consciousness.  When she got up, she felt very short of breath.  She  tried to lie down and was very restless with worsening tachy  palpitations and dyspnea.  She then presented to the [facility]  where she was found to be in AFib with RVR with rates in the 120-130.  She was treated with IV diltiazem, subsequently followed by a drip.  She  was also given some IV Lasix.  Chest x-ray there showed right lower lobe  airspace disease, question of pneumonia.  She also had right pleural  effusion.  She was given Lasix and albuterol nebulizer in the ED.  She  did complain of chest pain which is all reproducible.  Her cardiac  markers have been elevated with troponin of 0.11 x2.  She was  transferred to  [facility] for further evaluation.  She currently appears  comfortable, but does continue to complain of chest pain, especially  with palpation over the epigastrium.   ALLERGIES:  VESICARE made her sick.   HOME MEDICATIONS:  1. Vitamin B12 1000 mcg daily.  2. Alprazolam 0.5 mg b.i.d. p.r.n.  3. Paroxetine hydrochloride 20 mg daily.  4. Lanoxin 250 mcg daily.  5. Nexium 40 mg daily.  6. O2 two liters per minute p.r.n.  7. ProAir HFA 2 puffs q.6 h. p.r.n.  8. Aspirin 325 mg daily.  9. Vicodin 5/500 one to two tabs q.6 p.r.n.  10.Lopressor 25 mg b.i.d.   FAMILY HISTORY:  Mother is alive at age 51 with history of coronary  artery disease and diabetes.  Father died at 44 of MI.  She has a  brother who has diabetes and coronary artery disease.  She has a sister  who she does not talk to.   SOCIAL HISTORY:  She lives in [place] with some friends.  She is  disabled.  She has about 20-pack a year history tobacco abuse and now  smoking 3-4 cigarettes a day.  She used to drink a case of beer and  half  a gallon of liquor several times per week and says she decreased it in  October 2009 and cannot remember the last time she had a drink.  She  previously smoked marijuana as a teenager, but says nothing since then.  She is not exercising and not adhere to any type of diet.   REVIEW OF SYSTEMS:  Positive for chest pain, dyspnea, lower extremity  edema, urinary frequency, depression, anxiety, low back pain, GERD,  abdominal/epigastric pain, and nausea.  Otherwise, all systems reviewed  and negative.   PHYSICAL EXAMINATION:  VITAL SIGNS:  Temperature 97.8, heart rate 85,  respirations 22, blood pressure 102/61, and pulse ox 98% on 2 liters.  GENERAL:  A pleasant white female in no acute distress, awake, alert,  and oriented x3.  Affect normal.  NEURO:  Grossly intact and nonfocal.  HEENT:  Normal with the exception of poor dentition.  MUSCULOSKELETAL:  Grossly, no deformity or effusions.  NECK:  No bruits, JVD, or lymphadenopathy.  LUNGS:  Respirations are regular and unlabored with a few crackles at  the bases.  CARDIAC:  Irregularly regular.  S1 and S2.  No S3, S4, or murmurs.  ABDOMEN:  Round and soft with significant epigastric tenderness on  palpation.  Bowel sounds are present x4.  EXTREMITIES:  Warm and dry without clubbing or cyanosis.  There is trace  lower extremity edema.  SKIN:  Notable for multiple small healing abrasions on bilateral lower  extremities, which she says are related to shaving her legs.   Chest x-ray showed right lower lobe airspace disease, questionable  pneumonia, right pleural fusion.  EKG shows AFib at 123, left axis, T-  wave inversion in 1 and AVL.  Hemoglobin 12.4, hematocrit 38.5, WBC  12.9, and platelets 230.  Sodium 140, potassium 3.4, chloride 105, CO2  of 29, BUN 2, creatinine 0.83, glucose 109, total bilirubin 0.8,  alkaline phosphatase 30, AST 18, ALT 14, total protein 6.2, and albumin  3.3.  BNP 242, CK-MB 2.5, and troponin-I 0.11.   Calcium 8.3.  Digoxin  less than 0.2.   ASSESSMENT/PLAN:  1. Atrial fibrillation with rapid ventricular response, duration      unclear.  The patient says she has been admitted greater than 10      times  to [facility] since October with increased heart rate      as well as dyspnea and chest pain.  She has no awareness as to what      diagnostic tests might have been performed, but says that someone      told her she could not be shocked because of something behind my      heart.  We have faxed over a requisition for records from [facility].  Notably, she is not on Coumadin.  We will add heparin      and IV diltiazem.  Continue digoxin.  I am not sure how her blood      pressure will tolerate beta-blocker on the top of diltiazem at this      point.  Check magnesium and supplement potassium.  Check TSH.  If      the echo was not previously done, we will obtain an echo here.  We      will check a D-dimer.  Overall, she is a poor historian and does      report some medication noncompliance.  She may be a difficult      Coumadin candidate.  2. Elevated troponin in the setting of atrial fibrillation with rapid      ventricular response.  She does have chest pain, however, this is      clearly reproducible.  Her troponin trend is flat.  We will      continue to cycle cardiac markers.  We are adding heparin and      aspirin.  3. Tobacco abuse/ethyl alcohol abuse.  Cessation advised.  She says      she reduced intake/consumption in October 2009.  4. Gastroesophageal reflux disease.  Continue PPI.  5. Depression and anxiety.  Continue home meds.      Nicolasa Ducking, ANP      Rollene Rotunda, MD, [facility]  Electronically Signed    CB/MEDQ  D:  12/24/2008  T:  12/25/2008  Job:  [id number]

## 2011-04-21 NOTE — H&P (Signed)
Kim [name], COVINO NO.:  [id number]   MEDICAL RECORD NO.:  [id number]          PATIENT TYPE:  INP   LOCATION:  IC09                          FACILITY:  [facility]   PHYSICIAN:  Skeet Latch, DO    DATE OF BIRTH:  1966-06-18   DATE OF ADMISSION:  01/26/2009  DATE OF DISCHARGE:  [facility]                              HISTORY & PHYSICAL   PRIMARY CARE PHYSICIAN:  None.   HISTORY OF PRESENT ILLNESS:  This is a 45 year old Caucasian female with  recent discharge from [facility] for atrial fibrillation with  rapid ventricular response, COPD exacerbation.  At that time, patient  presented with chest pain for approximately 4 days with radiation to her  back.  In the emergency room, she was felt to be tachypneic, have atrial  fibrillation with RVR.  Patient was admitted and stayed for  approximately 7 days.  Patient was discharged on a beta-blocker, calcium  channel blocker, aspirin, digoxin.  Patient now returns stating that she  again having palpitations and chest discomfort.  Patient states that she  was seen in a facility in [place] for this atrial fibrillation with  palpitations yesterday and was sent home.  Patient presented today with  similar complaints and was found to be in atrial fibrillation with rapid  ventricular response.  Patient was started on IV Cardizem in the  emergency room.   PAST MEDICAL HISTORY:  Positive for:  1. Atrial fibrillation.  2. Hypertension.  3. CHF.  4. Cirrhosis.  5. Hepatitis B.  6. Hernia.  7. Tobacco abuse.  8. Depression.  9. GERD.  10.Peripheral vascular disease.  11.Chronic low back pain.  12.Morbid obesity.   SURGICAL HISTORY:  Cholecystectomy.   SOCIAL HISTORY:  Nondrinker.  She smoked in the last 12 months, 1 pack a  day.  No history of drug abuse.   ALLERGIES:  1. VESICARE.  2. COUMADIN.  SHE STATES IT CAUSES NOSEBLEEDS.   HOME MEDICATIONS:  1. Metoprolol 50 mg twice a day.  2. She is on oxygen 2 L as  needed.  3. Nexium 40 mg daily.  4. Alprazolam 0.5 mg as needed.  5. ProAir 2 puffs as needed.  6. Atrovent inhaler as needed.  7. Proventil unknown dose.  8. Advair unknown dose.  9. Spirolactone 25 mg daily.  10.Diltiazem HCL 120 mg once a day.   REVIEW OF SYSTEMS:  Positive for palpitations, some chest pain.   PHYSICAL EXAM:  VITAL SIGNS:  Temperature is 97.6.  Respiratory rate 22.  Pulse 123.  She is saturating 97%.  GENERAL:  She is well nourished, well hydrated, well developed, no acute  distress.  HEENT:  Head is atraumatic, normocephalic.  EYES:  PERRLA.  EOMI.  NECK:  Soft, supple, nontender, nondistended.  No scleral icterus.  HEART:  Irregular, irregular rhythm.  She is tachycardiac.  LUNGS:  Clear to auscultation bilaterally.  No rales, rhonchi, or  wheezes.  ABDOMEN:  Obese, soft, nontender, nondistended.  Positive bowel sounds.  No rigidity or guarding.  Extremities:  No clubbing or cyanosis is noted.  NEUROLOGIC:  Cranial nerves II-XII are grossly intact.  Patient alert  and oriented x3.   Her EKG showed a supraventricular tachycardia with left axis deviation,  ST-T wave abnormalities, rate was 186.   LABS:  Myoglobin 78.2, troponin less than 0.05, CK-MB is 2.1.  BNP is  328.  PTT is 26, PT 14.1, INR is 1.1.  Sodium 130, potassium 4.1,  chloride 94, CO2 is 23, glucose 137, BUN 60, creatinine 0.94.  White  count is 20,400, hemoglobin 13.0, hematocrit 39.0, platelet count  229,000.  Chest x-ray showed cardiomegaly with vascular congestion,  resolved right effusion and right base atelectasis/airspace disease.   ASSESSMENT:  1. Atrial fibrillation with rapid ventricular response.  2. Palpitations.  3. Chronic obstructive pulmonary disease.  4. History of hypertension.  5. History of cirrhosis.  6. History of hepatitis.   PLAN:  Patient admitted to the service of [facility] to the ICU at this  time.  Patient has been started on a Cardizem drip in the  emergency  room.  We will continue her on a Cardizem drip for rate control.  Patient will be continued on her beta-blocker at this time also.  Patient will be placed on heparin at this time secondary to her  nosebleeds with  Coumadin.  We will get a cardiology consult; however, cardiology does  not come here on weekend.  We will get a cardiology consult on Monday.  Patient will be placed on her other home medications.  She will be  placed on DVT and GI prophylaxis.  Patient will be followed closely.      Skeet Latch, DO  Electronically Signed     SM/MEDQ  D:  01/26/2009  T:  01/26/2009  Job:  [id number]

## 2011-04-21 NOTE — Discharge Summary (Signed)
Kim [name], Kim [name]                 ACCOUNT NO.:  [id number]   MEDICAL RECORD NO.:  [id number]          PATIENT TYPE:  INP   LOCATION:  3708                         FACILITY:  [facility]   PHYSICIAN:  Kim Fells. Excell Seltzer, Kim [name]  DATE OF BIRTH:  1966/03/03   DATE OF ADMISSION:  01/02/2009  DATE OF DISCHARGE:  01/09/2009                               DISCHARGE SUMMARY   PRIMARY CARDIOLOGIST:  Kim Friends. Dietrich Pates, Kim [name], [facility]   PRIMARY CARE PHYSICIAN:  Currently, the patient does not have a primary  care physician.   DISCHARGE DIAGNOSIS:  Atrial fibrillation with rapid ventricular  response.  A.  Per the patient diagnosed in October 2009.  At the time of  discharge, rate controlled and anticoagulated on Coumadin.  INR was  therapeutic.   SECONDARY DIAGNOSES:  1. Abdominal pain ? secondary to possible cirrhosis.  2. Hepatitis C.  3. Chronic obstructive pulmonary disease, on home oxygen.  4. Tobacco abuse disorder.  5. Depression.  6. Anxiety.  7. Obesity.  8. Gastroesophageal reflux disease.  9. Remote ethanol abuse (sober for 2 years).  10.Hypertension.  11.Coronary artery disease (nonobstructive).  12.Chronic low back pain.  13.Peripheral vascular disease.  14.Moderate left ventricular hypertrophy with moderate-to-severe left      atrial enlargement (per echocardiogram, January 2010, ejection      fraction approximately 65%).  15.Status post cholecystectomy.   ALLERGIES:  VESICARE.   PROCEDURES PERFORMED DURING THIS HOSPITALIZATION:  1. EKG from January 02, 2009 showed atrial fibrillation with a rate of      68, left axis deviation, no significant ST-T wave changes, no Q-      waves, QRS 112, QTc 448.  2. The patient had a chest x-ray performed on January 02, 2009 that      showed persistent cardiomegaly with vascular congestion, right base      collapse/consolidation with associated small-to-moderate right      pleural effusion.  3. EKG performed on January 04, 2009 showed atrial  fibrillation with a      rate of 79 with left axis deviation.  No significant changes from      previous EKG performed on January 02, 2009.  4. The patient had a chest x-ray on January 07, 2009 that showed no      change in right effusion.  5. The patient had a CT of the abdomen with contrast on January 02, 2009 that showed:      a.     Moderate large right pleural effusion with compressive right       basilar atelectasis.      b.     Cardiomegaly.      c.     Cirrhotic appearing liver.  Small periportal lymph nodes are       likely reactive.      d.     Small amount of perihepatic ascites is likely related to       cirrhosis.  6. The patient had a CT of the pelvis with contrast on January 02, 2009 that showed pelvic ascites, likely related to cirrhosis.      Pelvis is otherwise negative.  7. The patient had an ultrasound of the abdomen on January 06, 2009      that showed:      a.     Prior cholecystectomy.  No evidence of biliary ductal       dilatation or other acute findings within the abdomen.      b.     Right pleural effusion, incidentally noted.  8. EGD with biopsy on January 06, 2009 that showed:      a.     Erosions, multiple.      b.     Otherwise normal examination, findings do not explain       abdominal pain.   HISTORY OF PRESENT ILLNESS:  This is a 45 year old female with history  of atrial fibrillation, nonobstructive CAD, COPD on home O2, presenting  with atrial fibrillation with RVR.  The patient was discharged on  December 28, 2008 after admission for AFib with RVR.  The patient's  cardiac enzymes were elevated but cardiac catheterization showed  nonobstructive CAD (cardiac enzymes likely elevated secondary to demand  ischemia).  The patient is rate controlled on beta blocker and  anticoagulated on Coumadin, which was recently started at the last  hospitalization.  The patient developed nausea and vomiting recently,  and was unable to keep any food down  over the last couple of days.  She  has also developed epigastric/periumbilical abdominal pain and watery  diarrhea.  She has also had significantly decreased p.o. intake.  Today,  the patient arrived in the ER after approximately 5 days of symptoms and  was found to be in AFib with RVR up to 150s.   HOSPITAL COURSE:  The patient was admitted and underwent the procedures  listed above.  At the time of her evaluation by Cardiology in the  emergency department, she was already beginning to respond to IV  diltiazem drip and was soon rate-controlled.  She was switched to p.o.  long-acting diltiazem and continued on through the course of her  hospital stay.  She will continue taking oral diltiazem, outpatient.  GI  was consulted and evaluated the patient on January 05, 2009 and  subsequently underwent EGD described above.  However, upon discharge,  the patient's abdominal pain could not be completely explained by  imaging and by procedures performed.  The patient will follow up  outpatient in [place] with Gastroenterology.  At the time of  discharge, the patient's atrial fibrillation had been and was currently  rate controlled, her INR was therapeutic.  The patient will follow up  with her primary cardiologist as well as with Coumadin Clinic this  Friday.  The patient has also been given instructions as to how to  obtain a primary care physician in [place] for a long-term management  of her many comorbidities.  The patient's vital signs had remained  stable in the last several days of her hospitalization and most recent  vital signs at the time of discharge were temperature 98.7 degrees  Fahrenheit, BP 116/68, pulse 75, respirations 18, and O2 saturation 92%  on room air.  The patient's abdominal pain improved, however, she  continued to have some mild symptoms at the time of discharge.  The  patient received all of her followup information as well as her  medication list in both oral  and written form and at the time of her  discharge  no questions or concerns had not been addressed.   DISCHARGE LABORATORY DATA:  PT 27.3 and INR 2.4.  Sodium 140, potassium  4.3, chloride 100, CO2 34, BUN 11, creatinine 1.16, glucose 122, and  calcium 8.9.  On January 05, 2009, WBC 8.4, HGB 12.5, HCT 38.2, and PLT  count 255.  On January 02, 2009, BNP was 965.  On that same date, lipase  was 22.  On January 04, 2009, BNP was 661.  On January 07, 2009, BNP was  829.  On January 05, 2009, HCV RNA quant was 3570000 and HCV RNA QNT was  6.55, both are elevated.  Hepatitis B surface antigen and core antibody  were both negative.  Hepatitis A antibody was negative.   FOLLOWUP PLANS AND APPOINTMENTS:  1. The patient has appointment with Tereso Newcomer, the physician      assistant working with Dr. Dietrich [name] in [place] on January 11, 2009 at 1:00 p.m.      a.     Coumadin Clinic at [facility].  Lab draw to precede office       visit.  2. The patient has been instructed to go any day to [address] to have an initial visit with either of his primary care      physicians there.  Phone number and hours of the clinic were given      to the patient in oral and written form.  3. The patient has an appointment with Tana Coast, the physician      assistant working with a gastroenterologist in [place] on      January 24, 2009 at 11:00 a.m.   DISCHARGE MEDICATIONS:  1. Aspirin 81 mg (enteric-coated) p.o. daily.  2. Coumadin 5 mg by mouth on Monday, Wednesday, Friday, and Saturday      and 2.5 mg by mouth on Tuesday, Thursday, and Sunday.  3. Digoxin 0.25 mg p.o. daily.  4. Paroxetine 20 mg p.o. daily.  5. ProAir HFA 2 puffs q.6 h. p.r.n.  6. Atrovent HFA 2 puffs q.6 h.  7. Metoprolol 50 mg p.o. b.i.d.  8. Vicodin 5/325 mg 1-2 tablets q.8 h. p.r.n.  9. Spironolactone 25 mg p.o. daily.  10.Nexium 40 mg p.o. daily.  11.Diltiazem CD 120 mg p.o. daily.  12.Oxygen as  previously instructed.  13.Alprazolam 0.5 mg up to twice a day p.r.n.  14.Vitamin B12 100 mcg p.o. daily.   DURATION OF DISCHARGE ENCOUNTER:  Including physician time was 1 hour  and 15 minutes.      Jarrett Ables, [facility]      Kim Fells. Excell Seltzer, Kim [name]  Electronically Signed    MS/MEDQ  D:  01/09/2009  T:  01/10/2009  Job:  [id number]   cc:   Kim Friends. Dietrich Pates, Kim [name], [facility]  [address], PA-C  Tana Coast, P.A.

## 2011-04-21 NOTE — Discharge Summary (Signed)
Kim [name], Kim [name]                 ACCOUNT NO.:  [id number]   MEDICAL RECORD NO.:  [id number]          PATIENT TYPE:  INP   LOCATION:  A336                          FACILITY:  [facility]   PHYSICIAN:  Lonia Blood, M.D.      DATE OF BIRTH:  03-06-1966   DATE OF ADMISSION:  01/15/2009  DATE OF DISCHARGE:  02/16/2010LH                               DISCHARGE SUMMARY   PRIMARY CARE PHYSICIAN:  The patient currently has no local primary care  physician.  Will refer her to Dr. Margo Aye to follow up.   DISCHARGE DIAGNOSES:  1. Atrial fibrillation with rapid ventricular response.  2. Chronic obstructive pulmonary disease exacerbation.  3. Cirrhosis of the liver.  4. Transient nausea and vomiting.  5. Gastroesophageal reflux disease.  6. Hypertension.  7. Hepatitis C.  8. Liver cirrhosis.  9. Morbid obesity.   DISCHARGE MEDICATIONS:  1. Nexium 40 mg daily.  2. Metoprolol 50 mg daily.  3. Diltiazem CD 250 mg daily.  4. Spironolactone 25 mg daily.  5. Alprazolam 0.5 mg b.i.d. p.r.n.  6. Atrovent HFA 2 puffs q.6 hours.  7. Proventil HFA 2 puffs 4 times a day.  8. Advair Diskus 250/50 one puff twice a day.  9. Vicodin 5/325 one to two tablets q.8 hours.  10.Aspirin 81 mg daily.  11.Digoxin 0.25 mg daily.  12.Ambien 5 mg p.o. nightly.   DISPOSITION:  The patient will be referred to have a new primary care  physician in town.  She will also follow up with [facility] for  further management.   CONSULTATIONS:  Dr. Dietrich Pates of [facility].   PROCEDURES PERFORMED:  1. Chest x-ray performed on January 07, 2009 that shows no change in      right effusion.  2. Chest x-ray performed on January 15, 2009 that shows CHF and right      base atelectasis, small right pleural effusion.  3. Another chest x-ray on January 17, 2009 showed mild CHF with      increased right pleural effusion and right base atelectasis.  4. Abdominal x-ray performed on February 13 showed no acute findings,  but showed some hepatomegaly.  5. Chest x-ray on February 14 showed decreasing right pleural      effusion, small left pleural effusion, and cardiomegaly, right      greater than left airspace disease, likely represents atelectasis      or early infection.  6. Echocardiogram on January 15, 2009 shows no interval change from      prior echo on January 19.  It shows left ventricular function was      hyperdynamic, but with left ventricular wall thickness moderately      increased and systolic left ventricular mid cavity obliteration.      Moderate mitral annular calcification.  There was mild right      ventricular hypertrophy.   BRIEF HISTORY AND PHYSICAL:  Please refer to the detailed history and  physical by Dr. Dorris Singh.  In short, however, the patient is a 45-  year-old female who came to the emergency room with  chest pain and COPD.  She was having symptoms for about 4 days.  She was having pain that was  substernal radiating to her back.  In the ED, the patient was found to  be tachypneic, but also having atrial fibrillation with rapid  ventricular response.  She was known to have atrial fibrillation prior  to coming in.  Apparently, was getting an exacerbation and she was  subsequently admitted for further management.   HOSPITAL COURSE:  1. Atrial fibrillation with rapid ventricular response.  The patient      was started on IV Cardizem drip, placed in the ICU, and on      telemetry monitoring.  She responded to treatment effectively and      was gradually transitioned to oral.  The patient has had apparent      nosebleed on Coumadin and so by cardiology request, she was taken      off of the Coumadin.  2. Chronic obstructive pulmonary disease exacerbation.  The patient      was treated with preadmission nebulizers and antibiotics, dose of      steroids initially.  She has responded now and she is no longer      wheezing, and she is no longer having any chronic  obstructive      pulmonary disease exacerbation symptoms at this point.  3. Anxiety disorder.  Again, the patient was placed on alprazolam,      which we continued in the hospital.  4. Nausea and vomiting, and gastroesophageal reflux disease.  The      patient had an abdominal series because of abdominal pain with no      significant findings except for her hepatomegaly.  At this point,      she was continued on PPI up to the time of this discharge.  5. Hepatitis C and cirrhosis.  The patient will have continued      followup as an outpatient for her hepatitis C and cirrhosis.   Otherwise, she is currently stable and ready for discharge.  She will  follow up with [facility] Cardiology as indicated.      Lonia Blood, M.D.  Electronically Signed     LG/MEDQ  D:  01/22/2009  T:  01/22/2009  Job:  [id number]

## 2011-04-21 NOTE — Consult Note (Signed)
NAMELUKE, RIGSBEE NO.:  [id number]   MEDICAL RECORD NO.:  [id number]          PATIENT TYPE:  INP   LOCATION:  IC09                          FACILITY:  [facility]   PHYSICIAN:  Gerrit Friends. Dietrich Pates, MD, FACCDATE OF BIRTH:  1966/09/07   DATE OF CONSULTATION:  01/28/2009  DATE OF DISCHARGE:                                 CONSULTATION   CARDIOLOGIST:  Dr. Rockledge Bing   PRIMARY CARE PHYSICIAN:  None.   REASON FOR CONSULTATION:  Atrial fibrillation and chest pain.   HISTORY OF PRESENT ILLNESS:  Kim [name] is a 45 year old female patient  who was just evaluated at [facility] by our service on January 16, 2009.  Please see that note for complete details.  Briefly, the  patient has been seen on multiple occasions at different hospitals for  atrial fibrillation with rapid ventricular response.  She was seen at  [facility] in mid-January 2010 and did undergo cardiac catheterization.  She had nonobstructive coronary disease with a 40% mid to distal LAD  lesion, 30% to 40% mid-ramus intermediate lesion, and a 30% to 40%  proximal posterolateral lesion.  She has had good LV function with an EF  of 65%.  Her last echocardiogram on January 15, 2009, demonstrated  hyperdynamic LV function with moderate LVH.  SAM could not be excluded.  She had questionable end-systolic LV mid-cavity obliteration, mild  mitral regurgitation, moderate mitral annular calcification, moderate  left atrial enlargement.  Her EF has been 65% in the past.  She also has  a history of hepatitis C and hepatic cirrhosis with evidence of ascites  by CT scanning in late January 2010 at [facility].  She was  initially placed on Coumadin.  However, when we saw her a couple of  weeks ago, we though she was a poor candidate for Coumadin and this was  discontinued.  She was rate-controlled and eventually discharged to  home.  She presented back to [facility] on January 26, 2009,  which was 4 days post-discharge with recurrent symptoms of tachy-  palpitations.  She tells me that she had missed 2-3 days of her  medication secondary to constipation.  She also apparently went to  [facility] in [place] and was told she was in atrial  fibrillation.  She continues to note chest pain.  She says it radiates  through to her back.  This has been a consistent symptom of her since we  began seeing her back in January 2010.  She notes continued shortness of  breath.  This is usually with activity.  She also notes orthopnea as  well as PND.  She notes a nonproductive cough.  She denies syncope.  Since being admitted to [facility], she has been placed back on  digoxin, Diltiazem, and metoprolol.  Her heart rate is now better  controlled.   PAST MEDICAL HISTORY:  1. As outlined above.  2. In addition, she has history of diastolic congestive heart failure      in the setting of atrial fibrillation.  During her  past admission,      she had evidence of right pleural effusion on chest x-ray.  3. COPD.  4. Hepatitis C.  5. Peptic ulcer disease.  6. Hypertension.  7. B12 deficiency.  8. Peripheral arterial disease.  9. Status post laparoscopic cholecystectomy.  10.Questionable sleep apnea.   DISCHARGE MEDICATIONS ON January 22, 2009:  1. Nexium 40 mg daily.  2. Metoprolol 50 mg b.i.d.  3. Diltiazem CD 360 mg daily.  4. Spironolactone 25 mg daily.  5. Alprazolam 0.5 mg b.i.d. p.r.n.  6. Atrovent p.r.n.  7. Proventil p.r.n.  8. Advair 250/50 b.i.d.  9. Vicodin p.r.n.  10.Aspirin 81 mg daily.  11.Digoxin 0.25 mg daily.  12.Ambien 5 mg p.o. q.h.s.   ALLERGIES:  VESIcare.   SOCIAL HISTORY/FAMILY HISTORY:  Were reviewed on January 16, 2009.  Please see that note for details.   REVIEW OF SYSTEMS:  Please see HPI.  Denies fevers, chills, dysuria,  hematuria, bright red blood per rectum or melena.  The rest of the  review of systems was negative.    PHYSICAL EXAMINATION:  She is a well developed, well nourished female in  no respiratory distress.  Blood pressure 84/39, pulse 80, respirations 31, temperature 97.7.  Oxygen saturation is 96% on 2 liters.  HEENT:  Normal.  NECK:  Without JVD.  LYMPHS:  No lymphadenopathy.  ENDOCRINE:  Without thyromegaly.  CARDIAC:  Normal S1 and S2, irregularly irregular rhythm with a 1 to 2/6  systolic ejection murmur heard best along the left sternal border.  LUNGS:  Bibasilar crackles, right greater than left, improved partially  with cough.  SKIN:  Without rash.  ABDOMEN:  Soft with normoactive bowel sounds, no organomegaly, diffuse  tenderness with palpation noted.  EXTREMITIES:  Trace edema bilaterally.  MUSCULOSKELETAL:  Without joint deformity.  NEUROLOGICAL:  Alert and oriented x3.  Cranial nerves II-XII grossly  intact.  She is somewhat lethargic.   CHEST X-RAY:  Cardiomegaly without congestion, resolved right effusion  and right basilar atelectasis/airspace disease.   EKG:  Atrial fibrillation with a heart rate of 97, left axis deviation,  T-wave inversions in 1 and aVL, no significant change when compared to  January 04, 2009.   LABS:  White count 13,100, hemoglobin 11.0, platelet count 184,000.  Potassium 3.8, creatinine 0.86, glucose 100, albumin 3.2.  Point of care  markers negative x2.  TSH 2.205.  BNP 271.  INR 1.1.   ASSESSMENT:  1. Persistent atrial fibrillation with rapid ventricular response.      Her heart rate is now better controlled back on her medications.      She has been noncompliant with her rate-controlling medications      recently.  She is not a Coumadin candidate as described previously.  2. Chest pain with a history of nonobstructive coronary artery disease      by cardiac catheterization January 2010.  3. 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  4. Hypertension.  5. Chronic obstructive pulmonary disease.  6. Hepatitis C with hepatic cirrhosis.   RECOMMENDATIONS:  The patient was also interviewed and examined by Dr.  Dietrich Pates.  The patient presents with recurrent rapid ventricular rate in  the setting of medical noncompliance.  This presents a difficult  situation and has resulted in multiple hospital admissions.  Recommend  considering placement if feasible.  Otherwise,  attempts should be made  at treating her in the Emergency Room without requiring admission to the  hospital.  IV digoxin, metoprolol, and Diltiazem can be used to slow the  heart rate acutely.  Home Health has been requested.  Upon further  review with Dr. Dietrich Pates, it was felt that she was okay for discharge  from a cardiovascular standpoint.  She already has office followup  arranged.  No further workup is currently required for her chest pain.  Consideration could be given towards referring her to the Pain Clinic.  She seems to be well compensated at this time with her congestive heart  failure.      Tereso Newcomer, PA-C      Gerrit Friends. Dietrich Pates, MD, [facility]  Electronically Signed    SW/MEDQ  D:  01/28/2009  T:  01/28/2009  Job:  [id number]

## 2011-04-21 NOTE — H&P (Signed)
NAMEMARLEE, [name]                 ACCOUNT NO.:  [id number]   MEDICAL RECORD NO.:  [id number]          PATIENT TYPE:  INP   LOCATION:  IC05                          FACILITY:  [facility]   PHYSICIAN:  Dorris Singh, DO    DATE OF BIRTH:  [birth year]   DATE OF ADMISSION:  01/15/2009  DATE OF DISCHARGE:  LH                              HISTORY & PHYSICAL   The patient is a 45 year old Caucasian female who presented to the  emergency room with a chief complaint of chest pain and COPD.  The  patient states that this started about 4 days ago, was located in the  substernal region which radiated to her back, was described as being  moderate.  She had difficulty breathing with exertion and nothing  relieved the condition.  It was associated with cough, dyspnea and  nausea but has not been associated with fever.  Nothing made it better;  nothing made it worse.  She does have a significant history of coronary  artery disease.  While the patient was here, she was determined to be in  atrial fibrillation with rapid ventricular response, and she also had  chest pain.  She was started on a Cardizem drip down in the emergency  room.   PAST MEDICAL HISTORY:  Significant for:  1. CHF.  2. Hypertension.  3. Atrial fibrillation.  4. Cirrhosis of the liver.  5. Hepatitis B.  6. Hernia.   SURGICAL HISTORY:  Cholecystectomy.   SOCIAL HISTORY:  Nondrinker, current smoker in the last 12 months with 1  pack per day, and she denies any drug use.   Her allergies are to __________ .   REVIEW OF SYSTEMS:  Positive for chest pain, cough, dyspnea, nausea.  All other systems are negative.   MEDICATIONS:  1. Metoprolol tartrate 50 mg twice a day.  2. ProAir 2 puffs as needed.  3. Oxygen 2 liters as needed.  4. Nexium 40 mg a day.  5. Alprazolam 0.5 mg as needed.  6. Atrovent inhaler as needed; no dose given.  7. Proventil; no dose given.  8. Advair Discus; no dose given.  9. Spironolactone 25 mg once a  day.  10.Diltiazem 120 mg once a day.  11.Coumadin 2.5 mg once a day.   CURRENT VITALS:  Blood pressure 96/71, pulse rate 136, rhythm is  regular.   PHYSICAL EXAMINATION:  The patient is a 45 year old Caucasian female who  is obese.  She is in no acute distress.  HEAD:  Normocephalic, atraumatic.  EYES:  Are PERRL.  EOMI.  HEENT:  Are  within normal limits.  NECK:  Is supple.  No lymphadenopathy.  HEART:  Regular rate and rhythm.  LUNGS:  Clear to auscultation bilaterally.  ABDOMEN:  Soft, nontender, nondistended.  EXTREMITIES:  Positive pulses.  No edema, ecchymosis or cyanosis.  NEUROLOGICAL:  Cranial II-XII are grossly intact.   Her EKG showed atrial fibrillation, left axis deviation with rapid  ventricular response.  Chest x-ray shows CHF, right base atelectasis,  and small right pleural effusion.  White count 13.3, hemoglobin 29.9,  hematocrit  39.6, platelet count 273.  Her BMP was normal except for a  glucose of 202.  Her cardiac markers, her troponin is elevated 0.13.  INR 3.2.  A pH of 7.31, pCO2 47.5, pO2 94.6, bicarb 22.5.   ASSESSMENT AND PLAN:  1. Atrial fibrillation with rapid ventricular response.  2. Chronic obstructive pulmonary disease.  3. Shortness of breath.  4. Hypertension.  5. Cirrhosis of the liver.  6. Hepatitis C.   PLAN:  Will be to admit the patient to the service of [facility].  Will  start her on a Cardizem drip.  She was moved to the ICU.  Have consulted  cardiology to see her.  Will try to get her rate under control.  Will  put her on all of her home medications.  Will place her on heart healthy  diet.  Will get labs in the morning as well as an EKG.  Will place her  on DVT and GI prophylaxis.  Will continue to monitor and change therapy  as necessary.      Dorris Singh, DO  Electronically Signed     CB/MEDQ  D:  01/15/2009  T:  01/15/2009  Job:  [id number]

## 2011-04-21 NOTE — Discharge Summary (Signed)
NAMESHERIANN, NEWMANN NO.:  [id number]   MEDICAL RECORD NO.:  [id number]          PATIENT TYPE:  INP   LOCATION:  A320                          FACILITY:  [facility]   PHYSICIAN:  Skeet Latch, DO    DATE OF BIRTH:  Sep 11, 1966   DATE OF ADMISSION:  01/26/2009  DATE OF DISCHARGE:  02/23/2010LH                               DISCHARGE SUMMARY   PRIMARY CARE PHYSICIAN:  The patient has been referred to Dr. Margo Aye in  the past.   DISCHARGE DIAGNOSES:  1. Atrial fibrillation with rapid ventricular response.  2. History of xerosis.  3. History of chronic obstructive pulmonary disease.  4. History of gastroesophageal reflux disease.  5. Hypertension.  6. History of hepatitis C.  7. History of morbid obesity.  8. History of noncompliance.   BRIEF HOSPITAL COURSE:  This is a 45 year old Caucasian female known to  our service who was recently discharged from [facility] back on  January 22, 2009 with atrial fibrillation and RVR and COPD  exacerbation.  At that time, the patient had chest pain for 4 days, no  radiation.  The patient returns to emergency room complaining of  tachypnea, was found to be in atrial fibrillation with RVR.  The patient  states that she was seen in [place] for her atrial fibrillation with  palpitation, was sent home and continued to have palpitations, chest  pain and came to [facility] ER.  The patient was started on IV Cardizem  drip for her rapid ventricular response.  The patient was admitted.  Cardiology was consulted.   INITIAL LABORATORY DATA:  Her cardiac enzymes were unremarkable.  BNP  was 328.  Sodium 130, potassium 4.1, BUN 6, creatinine 0.94.  She had a  white count of 20,400.  Hemoglobin 30.0 and hematocrit 39.0.   A chest x-ray show cardiomegaly and vascular congestion and a resolved  right effusion and right base atelectasis, basilar airspace disease .  The patient was admitted to the ICU, on Cardizem drip, this was  transition to p.o. Cardizem.  The patient's beta-blocker was increased  also.  The patient's heart rate slowed down on the p.o. Cardizem.  She  was seen by Cardiology.  They say that her heart rate is better  controlled once she is back on her medication.  The patient did not  compliant with the rate controlling medications.  Recently, the patient  was on Coumadin secondary to patient states that it causes her to have  had nosebleeds.  A home help was requested and it was suggested that the  patient follow up with her cardiologist as an outpatient and they will  need a Pain Clinic for her clinic for her chronic conditions.  The  patient's pain improved, her breathing improved and now, the patient is  stable and could be discharged to home.   MEDICATIONS AT DISCHARGE:  1. Proventil 2 puffs as needed.  2. Atrovent 2 puffs as needed.  3. Nexium 40 mg daily.  4. Spironolactone 25 mg daily.  5. Diltiazem 120 mg daily.  6. Lactulose 1  tablespoon twice a day as needed.  7. Advair 1 inhalation twice a day.  8. Digoxin 0.25 mg daily.  9. Metoprolol 50 mg three times a day.  10.Xanax 1 mg twice a day p.r.n.  11.Oxycodone 5 mg one 4-6 hours p.r.n.  12.Bactroban 3 times a day for 10 days.   VITAL SIGNS:  Temperature is 97.4, pulse 73, respirations 20, blood  pressure 140/87, and she is sating 90% on room air.   CONDITION AT DISCHARGE:  Stable.   DISPOSITION:  The patient will be discharged home.   DISCHARGE INSTRUCTION:  The patient is told to follow up with her  primary care physician within a week.  The patient is also to follow up  with her cardiologist within 7-10 days.  The patient to maintain a low-  fat, heart-healthy diet, increase her activity slowly.  The patient is  told to return to emergency room if she have any chest pain,  palpitation, or similar symptoms and/or call 911.  The patient is told  about her noncompliance with the medications in the list as well.  The  patient  was requesting prescriptions for multiple medicines including  aspirin, antibiotic ointment, and her anxiety and pain medication.      Skeet Latch, DO  Electronically Signed     SM/MEDQ  D:  01/29/2009  T:  01/30/2009  Job:  [id number]

## 2011-04-21 NOTE — Cardiovascular Report (Signed)
NAMETASHI, ANDUJO NO.:  [id number]   MEDICAL RECORD NO.:  [id number]           PATIENT TYPE:   LOCATION:                                 FACILITY:   PHYSICIAN:  Arturo Morton. Riley Kill, MD, FACCDATE OF BIRTH:  [birth year]   DATE OF PROCEDURE:  12/27/2008  DATE OF DISCHARGE:                            CARDIAC CATHETERIZATION   Ms. Kim [name] is a 45 year old, who has been admitted with atrial fib with  rapid ventricular response.  She has dyspnea.  She has an ongoing  history of tobacco use, chronic obstructive pulmonary disease, and  reported history of hepatitis C.  Her rate has been controlled.  Radionuclide study has revealed an ejection fraction of 63% with a fixed  defect at the apex.  There was a question of stress-induced ischemia at  the lateral edge of the defect; therefore, cardiac catheterization was  felt to be indicated.  Risks, benefits, and alternatives were discussed  with the patient, and she consented to proceed.   PROCEDURES:  1. Left heart catheterization.  2. Selective coronary arteriography.  3. Selective left ventriculography.   DESCRIPTION OF THE PROCEDURE:  The patient was brought to the cath lab,  prepped and draped in the usual fashion.  We used a Smart needle to gain  access to the right femoral artery, a 5-French sheath was placed.  Standard Judkins catheter was used for the left coronary and a 3DRC for  the right coronary.  Central aortic and left ventricular pressures were  measured with pigtail.  There were no complications following  ventriculography.  All catheters were subsequently removed, and she was  taken to the holding area for sheath removal.   HEMODYNAMIC DATA:  1. The initial central aortic pressure is 113/78.  2. Left ventricular pressure 105/17.  3. There is no gradient on pullback across the aortic valve.   ANGIOGRAPHIC DATA:  1. On plain fluoroscopy, there is evidence of fairly marked mitral      annular  calcification.  In addition, there is mid LAD calcification      with about 40% plaquing.  2. The left main is free of critical disease.  3. The LAD courses to the apex.  There is segmental plaque in the mid      to distal segment.  This is calcified and probably is about 40% but      does not appear to be flow limiting.  The LAD wraps the apical tip.      There is a fairly small diagonal branch that bifurcates.  4. There is a ramus intermedius that has got 30-40% mild plaquing in      its midportion but is a small-caliber vessel.  5. The circumflex is a large-caliber vessel providing a marginal      branch.  In good views, there does not appear to be significant      obstruction.  6. The right coronary artery is a dominant vessel providing a      posterior descending and a posterolateral branch.  There may be      about  30-40% narrowing in the proximal portion of the      posterolateral vessel.  7. The ventriculogram demonstrates what appears to be an inferobasal      wall motion abnormality.  Whether this represents the prior area of      infarction is uncertain.  There is mitral annular calcification, at      least mild and perhaps even moderate mitral regurgitation.   CONCLUSIONS:  1. Inferior basal wall motion abnormality with mild reduction in      overall left ventricular function, possible inferobasal severe      hypokinetic segment.  2. Mitral annular calcification with mild possibly moderate mitral      regurgitation.  3. A 40% calcified left anterior descending stenosis.  4. No critical obstruction.   DISPOSITION:  The patient has presented with atrial fibrillation with  controlled ventricular response.  Enzymes are positive but probably  represent demand ischemia.  The current study does not demonstrate a  definite high-grade obstruction.  However, there is a wall motion  abnormality, the possibility of a prior ruptured plaque with  recanalization and/or possibility of  a cardiogenic embolus into the  coronaries could not be entirely excluded.  We will recommend continued  medical therapy.      Arturo Morton. Riley Kill, MD, [facility]  Electronically Signed     TDS/MEDQ  D:  12/27/2008  T:  12/27/2008  Job:  [id number]   cc:   Rollene Rotunda, MD, [facility]  CV Laboratory  Gracy Bruins  Dr. Marcy Panning.

## 2011-04-21 NOTE — Consult Note (Signed)
[name], Kim [name]                 ACCOUNT NO.:  [id number]   MEDICAL RECORD NO.:  [id number]          PATIENT TYPE:  INP   LOCATION:  IC05                          FACILITY:  [facility]   PHYSICIAN:  Gerrit Friends. Dietrich Pates, MD, FACCDATE OF BIRTH:  01-19-1966   DATE OF CONSULTATION:  01/16/2009  DATE OF DISCHARGE:                                 CONSULTATION   REASON FOR CONSULTATION:  Atrial fibrillation, chest pain.   PRIMARY CARE PHYSICIAN:  None.   CARDIOLOGIST:  She has been seen by our [facility] Cardiology Team in  [place] recently, and will be established soon with Dr. Dietrich Pates in  the [facility].   HISTORY OF PRESENT ILLNESS:  Kim [name] is a 45 year old female patient  with several recent admissions to the hospital secondary to chest pain  and atrial fibrillation.  She reportedly was evaluated at [facility] in October 2009 with atrial fibrillation.  She presented to  [facility] December 24, 2008 with chest pain and atrial  fibrillation with rapid ventricular rate.  Her enzymes are elevated, and  cardiac catheterization was eventually performed after an abnormal  stress test.  The cardiac catheterization demonstrated nonobstructive  coronary artery disease.  She was treated medically, and rate control  strategy was used for her atrial fibrillation.  She was felt to have  high thromboembolic risk factors, and she was placed on Coumadin  therapy.  She was to be seen in our office as an outpatient.  However,  she presented back to [facility] January 27 with abdominal  pain.  She was noted to be in atrial fibrillation with rapid ventricular  rate yet again.  She did have a GI workup while there with CT scans  demonstrating cirrhotic liver ascites.  She also had an EGD that  demonstrated some gastric erosions.  She had a moderate right pleural  effusion as well by CT scanning.  The plans were for her to follow up  with gastroenterology in [place] next  week.  She now has presented  back to [facility] with complaints of chest pain and atrial  fibrillation with rapid ventricular rate.  She notes dyspnea with  exertion with NYHA class III symptoms.  She notes orthopnea as well as  PND.  Her chest pain is substernal, and occurs at rest and with  exertion.  She does note some radiation to her left arm.  There has  really been no significant change in her chest symptoms.   PAST MEDICAL HISTORY:  Is as outlined above.  In addition:  1. Nonobstructive coronary disease by catheterization January 2010:      LAD 40% mid - distal, ramus intermediate 30-40% mid, posterolateral      30-40% proximal.  She was thought to have had a non-ST-elevation      myocardial infarction possibly secondary to demand ischemia in the      setting of atrial fibrillation with rapid ventricular rate versus      transient plaque rupture.  Medical therapy was recommended.  2. Paroxysmal - persistent atrial fibrillation with Coumadin  initiated      in January 2010.  She apparently developed some epistaxis in the      last several days, and was told by our Coumadin Clinic to      discontinue Coumadin use.  3. Echocardiogram January 2010 demonstrating EF 65% with moderate LVH,      LV obstruction at rest with a gradient of 34 mmHg, no obvious SAM,      moderate to marked left atrial enlargement.  4. History of diastolic congestive heart failure in the setting of      atrial fibrillation and rapid ventricular rate in January 2010.  5. COPD.  6. History of hepatitis C.  7. Peptic ulcer disease.  8. Hypertension.  9. B12 deficiency.  10.Peripheral arterial disease.  11.Status post laparoscopic cholecystectomy.  12.Questionable sleep apnea.   MEDICATIONS AT HOME:  1. Nexium 40 mg daily.  2. Metoprolol 50 mg daily.  3. Diltiazem ER 120 mg daily.  4. Spironolactone 25 mg daily.  5. Alprazolam 0.5 mg b.i.d. p.r.n.  6. Atrovent p.r.n.  7. Proventil p.r.n.  8.  Advair 250/50 p.r.n.  9. Vicodin p.r.n.  10.Aspirin 81 mg daily.  11.Digoxin 0.25 mg daily.  12.Oxygen.   ALLERGIES:  VESICARE.   SOCIAL HISTORY:  The patient lives in [place] with a friend.  She  continues to smoke cigarettes.  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  She has previously used marijuana.   FAMILY HISTORY:  Significant for CAD.  Her father had a myocardial  infarction at age 28.   REVIEW OF SYSTEMS:  Please see HPI.  Denies fevers, has had chills.  Denies rash.  Denies hematuria, but has had dysuria.  Denies bright red  blood per rectum or melena.  She  has had nausea and vomiting.  She also  had dysphagia.  She notes a cough productive of yellow-tinged sputum.  She denies syncope.  Rest of the review of systems are negative.   PHYSICAL EXAMINATION:  She is a well-nourished, well-developed female.  Blood pressure 102/74, pulse 100, respirations 22, temperature 97.5,  oxygen saturation 97% on 2 liters.  HEENT:  Normal.  NECK:  With positive JVD and lymphadenopathy.  ENDOCRINE:  Without thyromegaly.  CARDIAC:  Normal S1, S2, irregular irregular rhythm.  No murmur.  LUNGS:  With bilateral crackles, expiratory wheezing, egophony at the  right base.  SKIN:  Without rash.  ABDOMEN:  Soft with normoactive bowel sounds, positive distention,  positive epigastric and right upper quadrant tenderness to palpation.  EXTREMITIES:  With trace 4+ edema bilaterally.  MUSCULOSKELETAL:  Without joint deformity.  NEUROLOGIC:  She is alert and oriented x3.  Cranial nerves II-XII are  grossly intact.   Chest x-ray demonstrates CHF at the left base with atelectasis, small  right pleural effusions.  EKG:  Atrial fibrillation with a heart rate of  124, left axis deviation, T-wave inversions in I, aVL, V5 and 6 , no  change since January 04, 2009.   LABORATORIES:  White count 22,800, hemoglobin 12.2, platelet count  215,000.  Potassium 4.2, creatinine  1.05, BUN 3.4.  AST 41, ALT 37.  BNP  314.  Troponin I 0.08, 0.17, 0.16.  INR 2.5.  Magnesium 1.8.   Echocardiogram done January 15, 2009 demonstrates hyperdynamic LV  function, moderate LVH.  SAM cannot be excluded.  Question end systolic  LV mid cavity obliteration,  mild mitral regurgitation, moderate mitral  annular calcification, moderate  left atrial enlargement.   ASSESSMENT:  1. Persistent atrial fibrillation with rapid ventricular rate.  2. Acute on chronic diastolic congestive heart failure in the setting      of #1 with right-sided effusion on chest x-ray.  3. History of hepatitis C with hepatic cirrhosis and evidence of      ascites by recent CT scanning.  4. Hypertension with evidence of comparable hypertrophic      cardiomyopathy.  5. Chronic obstructive pulmonary disease.  6. Nonobstructive coronary artery disease by recent cardiac      catheterization.   RECOMMENDATIONS:  The patient was also examined by Dr. Dietrich Pates.  She  presents with multiple chronic complex medical problems.  She has atrial  fibrillation with normal LV function and no significant CAD.  She also  has chronic pain with multiple requests for increased  narcotics.  Given  her overall picture, she is not felt to be an ideal candidate for  Coumadin, and this should be discontinued.  Gastroenterology will see  her for her history of hepatic cirrhosis for evaluation and follow-up.  Her rate-controlling medications will be adjusted.  She will be diuresed  for her heart failure and pleural effusion.  Her prognosis is poor  secondary to psychosocial issues interacting with her multiple disease  processes.   We will follow the patient with Dr. Phineas Real this admission .      Tereso Newcomer, PA-C      Gerrit Friends. Dietrich Pates, MD, [facility]  Electronically Signed    SW/MEDQ  D:  01/17/2009  T:  01/17/2009  Job:  [id number]

## 2011-04-21 NOTE — Discharge Summary (Signed)
Kim [name], Kim [name] NO.:  [id number]   MEDICAL RECORD NO.:  [id number]          PATIENT TYPE:  INP   LOCATION:  2009                         FACILITY:  [facility]   PHYSICIAN:  Kim Rotunda, MD, FACCDATE OF BIRTH:  May 29, 1966   DATE OF ADMISSION:  12/24/2008  DATE OF DISCHARGE:  12/28/2008                               DISCHARGE SUMMARY   PRIMARY CARDIOLOGIST:  The patient will be establishing care in the  [place] office.   PRIMARY CARE PHYSICIAN:  The patient does not have a primary care  physician at this time, the patient encouraged to establish care with a  physician in [place] previously followed by Dr. Hiram Comber in [facility].   DISCHARGE DIAGNOSES:  1. Chest pain status post cardiac catheterization this admission.  On      December 27, 2008, results inferior base wall motion abnormality.      Mitral annular calcification with mild/moderate mitral      regurgitation.  A 40% calcified left anterior descending.  No      critical obstruction noted.  2. Atrial fibrillation with rapid ventricular response, this admission      associated with dyspnea and chest discomfort, and the patient      previously diagnosed with atrial fibrillation in October 2009,      workup to this point, unclear.  The patient with a [place] score of 2-      3, apparently not felt to be a candidate for Coumadin therapy in      the past because of compliance issues and EtOH abuse.  However,      because of her high [place] score, it was felt that the patient should      be attempted on anticoagulation therapy and followed closely      outpatient if the patient proves to be noncompliant and therefore      would recommend discontinuing anticoagulation/Coumadin therapy.   PAST MEDICAL HISTORY:  1. Paroxysmal atrial fib.  2. Hypertension.  3. Previous evaluations for chest pain.  At this time, it does not      appear that the patient has had a cardiac catheterization prior to      this  hospitalization.  Documentation from [facility]      states because of the patient's noncompliance.  She would probably      be a high-risk candidate for stenting if she underwent cardiac      catheterization and noninvasive ischemic workup/stress test      contemplated sometime in the future.  This was documented on      December 15, 2008.  Previous echocardiogram done September 2009, EF      55-60%.  4. Peptic ulcer disease status post esophagogastroduodenoscopy on      December 12, 2008, 2:20 p.m., mild prepyloric antral erosions in the      patient who uses good yeast powders and previous steroid therapy      for her chronic obstructive pulmonary disease.  5. Hepatitis C.  6. Chronic benzodiazepine and opiate dependency for chronic lower back  pain and anxiety.  7. Hypertension.  8. B12 deficiency.  9. Ongoing active tobacco dependency with chronic obstructive      pulmonary disease and history of respiratory failure.  10.Laparoscopic cholecystectomy in December 2009.  11.History of EtOH abuse.  The patient states she just quit drinking      daily in October 2009, previously was drinking the case of beer and      half a gallon of liquor several times a week.  12.History of marijuana use.  13.Morbid obesity.  14.Questionable sleep apnea/obesity, hypoventilation syndrome, which      needs to be worked up outpatient.  15.Chronic back pain, unclear etiology.  16.Functionally a literate.  17.Medical noncompliance.  18.Questionable history of cirrhosis of the liver.  19.Questionable history of congestive heart failure per patient's      report, no documentation to support this at this time.  20.History of pneumonia/right pleural effusions.   PROCEDURES AT THIS ADMISSION:  1. Stress Myoview on December 26, 2008, the patient developed chest      pain with adenosine infusion.  A 2-D echocardiogram December 25, 2008, showed an ejection fraction of 65%.  There was  Doppler      evidence of LV obstruction at rest with a peak velocity of 2.9      milliseconds, and was a peak gradient of 33 mmHg.  Ejection      fraction estimated to be 65%, aortic valve thickness mildly      increased, mild mitral annular calcification.  2. Cardiac catheterization on December 27, 2008, results as stated      above.   CONSULTATIONS AT THIS ADMISSION:  Tobacco cessation education and  pharmacy assistance with anticoagulation therapy.   HOSPITAL COURSE:  [name] is a 45 year old Caucasian female with a  history of paroxysmal atrial fib who presented with atrial fib with  rapid ventricular response and dyspnea.  She states was diagnosed back  in October 2009, at [facility].  Apparently, was also admitted to  [facility] just again recently for same as above and the patient reports  she was discharged over just a week ago.  However, she continued to have  tachy palpitations, reproducible chest pain, and dyspnea, then she  ultimately went to [facility] where she was found to be in  atrial fib with rapid ventricular response with a rate in the 120s-130s.  The patient treated with IV diltiazem drip.  Chest x-ray showed right  lower lobe air space disease, questionable pneumonia with a right  pleural effusion.  The patient was given Lasix and albuterol nebs.  She  did continued to complain of chest pain.  Troponin was elevated at 0.11  x2, was transferred to [facility] for further evaluation.  It was  unclear whether or not the patient had had diagnostic testing done in  for size, but we were able to obtain some old records from [place].  No  evidence of a cardiac catheterization or stress Myoview are done yet and  the patient was not on Coumadin.  The patient was started on heparin and  IV and continued IV Cardizem.  The digoxin was continued that she would  prior been on, then lab work was checked.  Tobacco and EtOH cessation  education was provided.  The  patient with chronic back pain, on Xanax  and Vicodin; this medication was also continued.  It was felt the  patient was most likely in mild heart failure  in the setting of atrial  fib with rapid ventricular response.  She was diuresed with low-dose  Lasix and responded well.  Ongoing dyspnea in the setting of excessive  tobacco use and COPD.  Her dyspnea was felt to be multifactorial.  A 2-D  echocardiogram obtained, results as stated above.  The patient scheduled  for a stress Myoview on December 26, 2008, found to have EF of 63% with a  large fixed defect at the apex, stress-induced ischemia at lateral edge  of the defect.  It was decided to proceed with cardiac catheterization  for further evaluation.  The patient took the cath lab on December 27, 2008, results as stated above.  The patient tolerated the procedure  without complications.  Coumadin therapy was initiated in a female with  high risk for CVA.  Extensive education regarding her Coumadin and the  risks and benefits has been discussed with the patient prior to  discharge.  As to whether or not she will be compliant with the  medication and lab checks, outpatient will be yet to be seen.  I have  spoken with Tereso Newcomer and made him aware of the patient's history.  We have arranged for her to have a PT/INR drawn in the [place] office  on Monday 25, 2010, at 1:30.  In the meantime, I am giving her 7.5 mg of  Coumadin today (Friday), her INR this morning is 1.1 and then I am  sending her out on 2.5 mg daily, which she will start on Saturday, this  will be continued until Monday when she has her level checked.  She is  to follow up with Tereso Newcomer on January 11, 2009, at 1:00 p.m. for a  post cath check.  I have given her the post cardiac catheterization  discharge instructions also.  In regards to her medication, Ms. Cieslewicz has  not established care with her primary care physician in [place] yet.  She is requesting  assistance with her prescriptions for the time being.  I will give her a few Vicodin and Xanax by prescription to titer her up  until she can establish care.  I have reinforced that we will not be  managing her chronic pain issues through [facility].   MEDICATIONS AT TIME OF DISCHARGE:  Warfarin 2.5 mg daily.  She is to  start on December 29, 2008, follow up Monday with the PT/INR, paroxetine  20 daily, Lanoxin 0.25 daily, aspirin 325 mg daily x14 days, then she  can decrease the dose to 81 mg daily, Nexium 40 mg daily, metoprolol 50  mg b.i.d., oxygen 2 L as previously instructed, ProAir inhaler 2 puffs  every 6 hours as needed, vitamin B12 100 mcg daily, alprazolam 0.5 mg  b.i.d. as needed, Vicodin 5/500 mg one to two tablets p.o. every 8 hours  as needed, Xopenex 0.45 mg 2 puffs inhaled every 6 hours as needed.   LABORATORY WORK PRIOR TO DISCHARGE:  WBC 6.4, hemoglobin 11.8,  hematocrit 36, platelet count 185,000.  Sodium 138, potassium 4.0,  glucose 92, creatinine 1.02.  PT/INR 14 and 1.1.  Lipid panel drawn on  December 25, 2008, total cholesterol 114, triglycerides 98, HDL 26, LDL  68.  Weight at time of discharge 120.3 kg, ALT 19, TSH 5.  Blood  pressure 118/78, afebrile, heart rate 86, sat 95% on room air.   Duration of discharge encountered well over 45 minutes.      Dorian Pod, ACNP  Kim Rotunda, MD, [facility]  Electronically Signed    MB/MEDQ  D:  12/28/2008  T:  12/28/2008  Job:  [id number]

## 2012-02-05 HISTORY — PX: PACEMAKER INSERTION: SHX728

## 2012-05-30 ENCOUNTER — Other Ambulatory Visit: Payer: Self-pay

## 2012-05-30 ENCOUNTER — Emergency Department (HOSPITAL_COMMUNITY): Payer: Medicaid Other

## 2012-05-30 ENCOUNTER — Emergency Department (HOSPITAL_COMMUNITY)
Admission: EM | Admit: 2012-05-30 | Discharge: 2012-05-31 | Disposition: A | Discharge status: Home / Self Care | Payer: Medicaid Other | Attending: Emergency Medicine | Admitting: Emergency Medicine

## 2012-05-30 ENCOUNTER — Encounter (HOSPITAL_COMMUNITY): Payer: Self-pay | Admitting: Emergency Medicine

## 2012-05-30 DIAGNOSIS — D649 Anemia, unspecified: Secondary | ICD-10-CM

## 2012-05-30 DIAGNOSIS — R600 Localized edema: Secondary | ICD-10-CM

## 2012-05-30 DIAGNOSIS — M549 Dorsalgia, unspecified: Secondary | ICD-10-CM | POA: Insufficient documentation

## 2012-05-30 DIAGNOSIS — I4891 Unspecified atrial fibrillation: Secondary | ICD-10-CM | POA: Insufficient documentation

## 2012-05-30 DIAGNOSIS — R079 Chest pain, unspecified: Secondary | ICD-10-CM | POA: Insufficient documentation

## 2012-05-30 DIAGNOSIS — R0602 Shortness of breath: Secondary | ICD-10-CM | POA: Insufficient documentation

## 2012-05-30 DIAGNOSIS — T45515A Adverse effect of anticoagulants, initial encounter: Secondary | ICD-10-CM

## 2012-05-30 DIAGNOSIS — I509 Heart failure, unspecified: Secondary | ICD-10-CM

## 2012-05-30 DIAGNOSIS — J449 Chronic obstructive pulmonary disease, unspecified: Secondary | ICD-10-CM | POA: Insufficient documentation

## 2012-05-30 DIAGNOSIS — R609 Edema, unspecified: Secondary | ICD-10-CM | POA: Insufficient documentation

## 2012-05-30 DIAGNOSIS — E079 Disorder of thyroid, unspecified: Secondary | ICD-10-CM | POA: Insufficient documentation

## 2012-05-30 DIAGNOSIS — D6832 Hemorrhagic disorder due to extrinsic circulating anticoagulants: Secondary | ICD-10-CM

## 2012-05-30 DIAGNOSIS — J4489 Other specified chronic obstructive pulmonary disease: Secondary | ICD-10-CM | POA: Insufficient documentation

## 2012-05-30 DIAGNOSIS — G8929 Other chronic pain: Secondary | ICD-10-CM

## 2012-05-30 DIAGNOSIS — Z950 Presence of cardiac pacemaker: Secondary | ICD-10-CM | POA: Insufficient documentation

## 2012-05-30 DIAGNOSIS — B1920 Unspecified viral hepatitis C without hepatic coma: Secondary | ICD-10-CM | POA: Insufficient documentation

## 2012-05-30 DIAGNOSIS — I252 Old myocardial infarction: Secondary | ICD-10-CM | POA: Insufficient documentation

## 2012-05-30 HISTORY — DX: Chronic obstructive pulmonary disease, unspecified: J44.9

## 2012-05-30 LAB — PROTIME-INR
INR: 1.45 (ref 0.00–1.49)
Prothrombin Time: 17.9 seconds — ABNORMAL HIGH (ref 11.6–15.2)

## 2012-05-30 NOTE — ED Provider Notes (Signed)
History   This chart was scribed for [employer]. Colon Branch, MD by Sofie Rower. The patient was seen in room APA16A/APA16A and the patient's care was started at 11:43 PM     CSN: [id number]  Arrival date & time 05/30/12  2210   First MD Initiated Contact with Patient 05/30/12 2310      Chief Complaint  Patient presents with  . Chest Pain  . Shortness of Breath    (Consider location/radiation/quality/duration/timing/severity/associated sxs/prior treatment) HPI  Kim [name] is a 46 y.o. female  With a h/o atrial fibrillation now with a pacemaker on chronic coumadin, CHF, HTN, Cirrhosis, chronic pain, COPD on home O2, who presents to the Emergency Department complaining of continued bilateral lower extremity edema, poor urine output, leg pain, chest pain, shortness of breath.Patient was recently admitted to [facility] for CHF and hyperglycemia.She was told she had "kidney problems". Since discharge on Friday she has continued to have lower extremity edema despite 160 mg Lasix daily and has only had urine output twice today despite lasix. She denies fever, chills, nausea, vomiting or diarrhea. She has shortness of breath with exertion which is not different than her baseline. She is experiencing sharp stabbing chest pain that is worse with deep breathing and movement.  PCP is Dr. Elaina Hoops.  Cardiologist Dr. Mayme Genta.      Past Medical History  Diagnosis Date  . Pacemaker     vent paced  . COPD (chronic obstructive pulmonary disease)   . CHF (congestive heart failure)   . Thyroid disease   . Hepatitis     Hep C  . MI (myocardial infarction)     Past Surgical History  Procedure Date  . Cholecystectomy   . Pacemaker insertion     History reviewed. No pertinent family history.  History  Substance Use Topics  . Smoking status: Current Everyday Smoker -- 0.5 packs/day  . Smokeless tobacco: Not on file  . Alcohol Use: No    OB History    Grav Para Term Preterm Abortions  TAB SAB Ect Mult Living                  Review of Systems  Constitutional: Negative for fever.       10 Systems reviewed and are negative for acute change except as noted in the HPI.  HENT: Negative for congestion.   Eyes: Negative for discharge and redness.  Respiratory: Positive for shortness of breath. Negative for cough.   Cardiovascular: Positive for chest pain and leg swelling.  Gastrointestinal: Negative for vomiting and abdominal pain.  Genitourinary: Positive for decreased urine volume.  Musculoskeletal: Negative for back pain.  Skin: Negative for rash.  Neurological: Negative for syncope, numbness and headaches.  Psychiatric/Behavioral:       No behavior change.  All other systems reviewed and are negative.    10 Systems reviewed and all are negative for acute change except as noted in the HPI.    Allergies  [facility]are  Home Medications   Current Outpatient Rx  Name Route Sig Dispense Refill  . SYMBICORT IN Inhalation Inhale 2 puffs into the lungs 2 (two) times daily.    Marland Kitchen CALCIUM CARBONATE 600 MG PO TABS Oral Take 1,200 mg by mouth every morning.    Marland Kitchen CLOTRIMAZOLE-BETAMETHASONE 1-0.05 % EX CREA Topical Apply 1 application topically daily. For places in scalp    . DIPHENHYDRAMINE-APAP (SLEEP) 25-500 MG PO TABS Oral Take 2 tablets by mouth daily as needed. For pain    .  DOXYCYCLINE HYCLATE 100 MG PO CAPS Oral Take 100 mg by mouth 2 (two) times daily. #20 prescribed/filled on 05/15/2012    . ESOMEPRAZOLE MAGNESIUM 40 MG PO CPDR Oral Take 40 mg by mouth every morning.    Marland Kitchen FERROUS SULFATE 325 (65 FE) MG PO TABS Oral Take 650 mg by mouth every morning.    . FUROSEMIDE 80 MG PO TABS Oral Take 80 mg by mouth every morning.    Marland Kitchen HYDROXYZINE HCL 10 MG PO TABS Oral Take 10 mg by mouth 3 (three) times daily as needed. For itching and/or anxiety    . METFORMIN HCL 500 MG PO TABS Oral Take 500 mg by mouth 2 (two) times daily with a meal.    . METOCLOPRAMIDE HCL 10 MG PO TABS  Oral Take 10 mg by mouth every 8 (eight) hours as needed. For nausea and indigestion    . TIOTROPIUM BROMIDE MONOHYDRATE 18 MCG IN CAPS Inhalation Place 18 mcg into inhaler and inhale daily.      BP 142/84  Temp 98.1 F (36.7 C) (Oral)  Resp 18  Ht 5\' 6"  (1.676 m)  Wt 243 lb (110.224 kg)  BMI 39.22 kg/m2  SpO2 99%  Physical Exam  Nursing note and vitals reviewed. Constitutional: She is oriented to person, place, and time. She appears well-developed and well-nourished.  HENT:  Right Ear: External ear normal.  Left Ear: External ear normal.  Nose: Nose normal.  Cardiovascular: Normal rate, regular rhythm and normal heart sounds.  Exam reveals no gallop and no friction rub.   No murmur heard. Pulmonary/Chest: Effort normal. She has rales (both baes bilaterally,).  Musculoskeletal: She exhibits edema (3 + Right is greater than the left. ).  Neurological: She is alert and oriented to person, place, and time.  Skin:       Multiple excoriated lesions to arms, legs, and scalp from picking.   Psychiatric: She has a normal mood and affect. Her behavior is normal.       anxious    ED Course  Procedures (including critical care time)  DIAGNOSTIC STUDIES: Oxygen Saturation is98% on 2 l [place], normal  by my interpretation.    COORDINATION OF CARE:    11:48PM- EDP at bedside discusses treatment plan concerning   Results for orders placed during the hospital encounter of 05/30/12  CBC      Component Value Range   WBC 12.0 (*) 4.0 - 10.5 K/uL   RBC 3.47 (*) 3.87 - 5.11 MIL/uL   Hemoglobin 9.4 (*) 12.0 - 15.0 g/dL   HCT 16.1 (*) 09.6 - 04.5 %   MCV 88.5  78.0 - 100.0 fL   MCH 27.1  26.0 - 34.0 pg   MCHC 30.6  30.0 - 36.0 g/dL   RDW 40.9 (*) 81.1 - 91.4 %   Platelets 335  150 - 400 K/uL  DIFFERENTIAL      Component Value Range   Neutrophils Relative 61  43 - 77 %   Neutro Abs 7.3  1.7 - 7.7 K/uL   Lymphocytes Relative 32  12 - 46 %   Lymphs Abs 3.9  0.7 - 4.0 K/uL   Monocytes  Relative 5  3 - 12 %   Monocytes Absolute 0.7  0.1 - 1.0 K/uL   Eosinophils Relative 1  0 - 5 %   Eosinophils Absolute 0.2  0.0 - 0.7 K/uL   Basophils Relative 0  0 - 1 %   Basophils Absolute 0.0  0.0 -  0.1 K/uL  TROPONIN I      Component Value Range   Troponin I 1.21 (*) <0.30 ng/mL  BASIC METABOLIC PANEL      Component Value Range   Sodium 139  135 - 145 mEq/L   Potassium 2.5 (*) 3.5 - 5.1 mEq/L   Chloride 97  96 - 112 mEq/L   CO2 29  19 - 32 mEq/L   Glucose, Bld 135 (*) 70 - 99 mg/dL   BUN 11  6 - 23 mg/dL   Creatinine, Ser 6.96  0.50 - 1.10 mg/dL   Calcium 8.5  8.4 - 29.5 mg/dL   GFR calc non Af Amer 81 (*) >90 mL/min   GFR calc Af Amer >90  >90 mL/min  PROTIME-INR      Component Value Range   Prothrombin Time 17.9 (*) 11.6 - 15.2 seconds   INR 1.45  0.00 - 1.49  GLUCOSE, CAPILLARY      Component Value Range   Glucose-Capillary 115 (*) 70 - 99 mg/dL    Date: 28/41/3244  0102  Rate:84  Rhythm: electronic ventricular pacemaker  QRS Axis: left  Intervals: normal  ST/T Wave abnormalities: normal  Conduction Disutrbances:none  Narrative Interpretation:   Old EKG Reviewed: changes noted c/w 01/27/2009 now with pacemaker, formerly atrial fibrillation   Dg Chest 2 View  05/30/2012  *RADIOLOGY REPORT*  Clinical Data: Chest pain and shortness of breath  CHEST - 2 VIEW  Comparison: 01/26/2009  Findings: Interval placement of a cardiac pacemaker with lead tips over the RA and RV regions.  Shallow inspiration.  Cardiac enlargement with normal pulmonary vascularity similar to previous study.  No focal airspace consolidation in the lungs.  No blunting of costophrenic angles.  No pneumothorax.  IMPRESSION: Persistent cardiac enlargement.  No evidence of active pulmonary disease.  Interval placement of cardiac pacemaker.  Original Report Authenticated By: Marlon Pel, M.D.    MDM  Patient with multiple medical problems , recently hospitalized in [facility] for hyperglycemia and  congestive heart failure, here with continued peripheral edema, continued shortness of breath, and sharp chest pain. EKG shows an electronic ventricular pacemaker. Patient states she's had a pacemaker for about 2 months. She has a history of atrial fibrillation and congestive heart failure. Initial labs show a stable anemia, elevated troponin, a low INR, and an elevation in BNP. Patient was given a diuretic with fair response. She was given analgesics x3 for a variety of pain complaints including back pain, sharp chest pain, dysuria. Chest x-ray shows persistent cardiac enlargement but no active disease. Patient feels she is at her baseline after treatment and observation in the emergency room.Pt stable in ED with no significant deterioration in condition.The patient appears reasonably screened and/or stabilized for discharge and I doubt any other medical condition or other [facility] requiring further screening, evaluation, or treatment in the ED at this time prior to discharge.  I personally performed the services described in this documentation, which was scribed in my presence. The recorded information has been reviewed and considered.   MDM Reviewed: nursing note and vitals Interpretation: labs, ECG and x-ray Total time providing critical care: 40 minutes.           Nicoletta Dress. Colon Branch, MD 05/31/12 [phone]

## 2012-05-30 NOTE — ED Notes (Signed)
Pt presents with Chest pain and SOB that started this morning about 1000. Pt states that it is a sharp stabbing pain and states that deep breaths and movement make the pain worse. States her pain is 10/10. Pt takes coumadin and EMS gave no meds in the field. Pt was discharged from [place] on Friday from what she states was "problems with her sugar" PT is AAx4

## 2012-05-31 LAB — URINALYSIS, ROUTINE W REFLEX MICROSCOPIC
Bilirubin Urine: NEGATIVE
Glucose, UA: NEGATIVE mg/dL
Hgb urine dipstick: NEGATIVE
Protein, ur: 30 mg/dL — AB
Urobilinogen, UA: 0.2 mg/dL (ref 0.0–1.0)

## 2012-05-31 LAB — TROPONIN I
Troponin I: 1.21 ng/mL (ref <0.30)
Troponin I: 0.38 ng/mL (ref <0.30)

## 2012-05-31 LAB — DIFFERENTIAL
Basophils Absolute: 0 10*3/uL (ref 0.0–0.1)
Basophils Relative: 0 % (ref 0–1)
Eosinophils Absolute: 0.2 10*3/uL (ref 0.0–0.7)
Eosinophils Relative: 1 % (ref 0–5)
Lymphs Abs: 3.9 10*3/uL (ref 0.7–4.0)
Neutrophils Relative %: 61 % (ref 43–77)

## 2012-05-31 LAB — BASIC METABOLIC PANEL
CO2: 29 mEq/L (ref 19–32)
Calcium: 8.5 mg/dL (ref 8.4–10.5)
Creatinine, Ser: 0.84 mg/dL (ref 0.50–1.10)
GFR calc Af Amer: >90 mL/min (ref >90)
GFR calc non Af Amer: 81 mL/min — ABNORMAL LOW (ref >90)
Sodium: 139 mEq/L (ref 135–145)

## 2012-05-31 LAB — CBC
MCH: 27.1 pg (ref 26.0–34.0)
MCHC: 30.6 g/dL (ref 30.0–36.0)
MCV: 88.5 fL (ref 78.0–100.0)
Platelets: 335 10*3/uL (ref 150–400)
RBC: 3.47 MIL/uL — ABNORMAL LOW (ref 3.87–5.11)
RDW: 22.7 % — ABNORMAL HIGH (ref 11.5–15.5)

## 2012-05-31 LAB — URINE MICROSCOPIC-ADD ON

## 2012-05-31 LAB — PRO B NATRIURETIC PEPTIDE: Pro B Natriuretic peptide (BNP): 3874 pg/mL — ABNORMAL HIGH (ref 0–125)

## 2012-05-31 MED ORDER — POTASSIUM CHLORIDE CRYS ER 20 MEQ PO TBCR
60.0000 meq | EXTENDED_RELEASE_TABLET | Freq: Once | ORAL | Status: AC
  Administered 2012-05-31: 60 meq via ORAL
  Filled 2012-05-31: qty 3

## 2012-05-31 MED ORDER — ONDANSETRON HCL 4 MG/2ML IJ SOLN
4.0000 mg | Freq: Once | INTRAMUSCULAR | Status: AC
  Administered 2012-05-31: 4 mg via INTRAVENOUS
  Filled 2012-05-31: qty 2

## 2012-05-31 MED ORDER — FUROSEMIDE 10 MG/ML IJ SOLN
80.0000 mg | Freq: Once | INTRAMUSCULAR | Status: AC
  Administered 2012-05-31: 80 mg via INTRAVENOUS
  Filled 2012-05-31: qty 8

## 2012-05-31 MED ORDER — MORPHINE SULFATE 4 MG/ML IJ SOLN
2.0000 mg | Freq: Once | INTRAMUSCULAR | Status: AC
  Administered 2012-05-31: 2 mg via INTRAVENOUS
  Filled 2012-05-31: qty 1

## 2012-05-31 MED ORDER — MORPHINE SULFATE 2 MG/ML IJ SOLN
2.0000 mg | Freq: Once | INTRAMUSCULAR | Status: AC
  Administered 2012-05-31: 2 mg via INTRAVENOUS
  Filled 2012-05-31: qty 1

## 2012-05-31 NOTE — Discharge Instructions (Signed)
Your coumadin level (INR) is a bit low here tonight at 1.45. Continue your coumadin and have your doctor check the INR again on Friday. You had a little extra fluid tonight for her to use Lasix. Your kidney function tests were normal. Your x-ray did not show a lot of fluid. Continue your regular medicines. Continuing oxygen at home. Call and make an appointment with your primary care physician for the end of this week or beginning of next week. Be sure to review all of your medicines with him.

## 2012-05-31 NOTE — ED Notes (Signed)
Patient requested in and out catheter, stated was unable to obtain urine sample on her own.

## 2012-05-31 NOTE — ED Notes (Signed)
Patient to wait until 0700 am to be discharged per Dr. Colon Branch. Patient stated she could not get a ride until then.

## 2012-05-31 NOTE — ED Notes (Signed)
Alexius, lab tech, called and notified me of critical lab values : potassium - 2.5 and troponin - 1.21.

## 2012-07-15 ENCOUNTER — Encounter (HOSPITAL_COMMUNITY): Payer: Self-pay | Admitting: *Deleted

## 2012-07-15 ENCOUNTER — Emergency Department (HOSPITAL_COMMUNITY): Payer: Medicaid Other

## 2012-07-15 ENCOUNTER — Emergency Department (HOSPITAL_COMMUNITY)
Admission: EM | Admit: 2012-07-15 | Discharge: 2012-07-16 | Disposition: A | Discharge status: Home / Self Care | Payer: Medicaid Other | Attending: Emergency Medicine | Admitting: Emergency Medicine

## 2012-07-15 DIAGNOSIS — J449 Chronic obstructive pulmonary disease, unspecified: Secondary | ICD-10-CM | POA: Insufficient documentation

## 2012-07-15 DIAGNOSIS — J4489 Other specified chronic obstructive pulmonary disease: Secondary | ICD-10-CM | POA: Insufficient documentation

## 2012-07-15 DIAGNOSIS — Z7901 Long term (current) use of anticoagulants: Secondary | ICD-10-CM | POA: Insufficient documentation

## 2012-07-15 DIAGNOSIS — Z8619 Personal history of other infectious and parasitic diseases: Secondary | ICD-10-CM | POA: Insufficient documentation

## 2012-07-15 DIAGNOSIS — I252 Old myocardial infarction: Secondary | ICD-10-CM | POA: Insufficient documentation

## 2012-07-15 DIAGNOSIS — R112 Nausea with vomiting, unspecified: Secondary | ICD-10-CM | POA: Insufficient documentation

## 2012-07-15 DIAGNOSIS — R0602 Shortness of breath: Secondary | ICD-10-CM | POA: Insufficient documentation

## 2012-07-15 DIAGNOSIS — F17200 Nicotine dependence, unspecified, uncomplicated: Secondary | ICD-10-CM | POA: Insufficient documentation

## 2012-07-15 DIAGNOSIS — J189 Pneumonia, unspecified organism: Secondary | ICD-10-CM | POA: Insufficient documentation

## 2012-07-15 LAB — CBC WITH DIFFERENTIAL/PLATELET
Basophils Absolute: 0 10*3/uL (ref 0.0–0.1)
HCT: 38.2 % (ref 36.0–46.0)
Hemoglobin: 12.3 g/dL (ref 12.0–15.0)
Lymphocytes Relative: 13 % (ref 12–46)
Monocytes Absolute: 1.1 10*3/uL — ABNORMAL HIGH (ref 0.1–1.0)
Monocytes Relative: 10 % (ref 3–12)
Neutro Abs: 8.5 10*3/uL — ABNORMAL HIGH (ref 1.7–7.7)
Neutrophils Relative %: 76 % (ref 43–77)
RDW: 19.1 % — ABNORMAL HIGH (ref 11.5–15.5)
WBC: 11.2 10*3/uL — ABNORMAL HIGH (ref 4.0–10.5)

## 2012-07-15 LAB — COMPREHENSIVE METABOLIC PANEL
ALT: 25 U/L (ref 0–35)
AST: 32 U/L (ref 0–37)
Albumin: 3.7 g/dL (ref 3.5–5.2)
Alkaline Phosphatase: 56 U/L (ref 39–117)
CO2: 25 mEq/L (ref 19–32)
Chloride: 98 mEq/L (ref 96–112)
Creatinine, Ser: 0.68 mg/dL (ref 0.50–1.10)
GFR calc Af Amer: >90 mL/min (ref >90)
GFR calc non Af Amer: >90 mL/min (ref >90)
Potassium: 3.7 mEq/L (ref 3.5–5.1)
Total Bilirubin: 1.5 mg/dL — ABNORMAL HIGH (ref 0.3–1.2)

## 2012-07-15 LAB — POCT I-STAT TROPONIN I

## 2012-07-15 LAB — D-DIMER, QUANTITATIVE: D-Dimer, Quant: 0.29 ug/mL-FEU (ref 0.00–0.48)

## 2012-07-15 MED ORDER — FENTANYL CITRATE 0.05 MG/ML IJ SOLN
50.0000 ug | Freq: Once | INTRAMUSCULAR | Status: AC
  Administered 2012-07-16: 50 ug via INTRAVENOUS
  Filled 2012-07-15: qty 2

## 2012-07-15 MED ORDER — ONDANSETRON HCL 4 MG/2ML IJ SOLN
4.0000 mg | Freq: Once | INTRAMUSCULAR | Status: AC
  Administered 2012-07-15: 4 mg via INTRAVENOUS
  Filled 2012-07-15: qty 2

## 2012-07-15 MED ORDER — IPRATROPIUM BROMIDE 0.02 % IN SOLN
RESPIRATORY_TRACT | Status: AC
  Administered 2012-07-15: 20:00:00
  Filled 2012-07-15: qty 2.5

## 2012-07-15 MED ORDER — LEVOFLOXACIN 500 MG PO TABS: 500.0000 mg | ORAL_TABLET | Freq: Every day | ORAL | Status: DC

## 2012-07-15 MED ORDER — HYDROCOD POLST-CHLORPHEN POLST 10-8 MG/5ML PO LQCR: 5.0000 mL | Freq: Two times a day (BID) | ORAL | Status: DC | PRN

## 2012-07-15 MED ORDER — ALBUTEROL SULFATE (5 MG/ML) 0.5% IN NEBU
INHALATION_SOLUTION | RESPIRATORY_TRACT | Status: AC
  Administered 2012-07-15: 20:00:00
  Filled 2012-07-15: qty 0.5

## 2012-07-15 MED ORDER — HYDROCOD POLST-CHLORPHEN POLST 10-8 MG/5ML PO LQCR
5.0000 mL | Freq: Once | ORAL | Status: AC
  Administered 2012-07-15: 5 mL via ORAL
  Filled 2012-07-15: qty 5

## 2012-07-15 MED ORDER — LEVOFLOXACIN 500 MG PO TABS
750.0000 mg | ORAL_TABLET | Freq: Every day | ORAL | Status: DC
  Administered 2012-07-15: 750 mg via ORAL
  Filled 2012-07-15: qty 1

## 2012-07-15 MED ORDER — FENTANYL CITRATE 0.05 MG/ML IJ SOLN
50.0000 ug | Freq: Once | INTRAMUSCULAR | Status: AC
  Administered 2012-07-15: 50 ug via INTRAVENOUS
  Filled 2012-07-15: qty 2

## 2012-07-15 NOTE — ED Notes (Signed)
RN to obtain labs with start of IV 

## 2012-07-15 NOTE — ED Notes (Signed)
Per EMS:  Pt has had increasing SOB over the past few days.  St's she has a symbicort inhaler at home that hasn't been helping.  Pt is on 2.5L [place] at home normally.  EMS st's pt had expiratory upper lobe wheezing, with a productive cough x1 day.  Pt st's it was think and brown.  Pt not in resp distress, resp even and unlabored.  Pt had 5 of albuterol and 0.5 of Atrovent in route with some relief.  Pt also complaining of some chest discomfort.

## 2012-07-15 NOTE — ED Notes (Signed)
Bed:WHALA<BR> Expected date:07/15/12<BR> Expected time: 7:37 PM<BR> Means of arrival:Ambulance<BR> Comments:<BR> RM A, [facility], SoB, neb tx in progress

## 2012-07-15 NOTE — ED Provider Notes (Addendum)
History     CSN: [id number]  Arrival date & time 07/15/12  1953   First MD Initiated Contact with Patient 07/15/12 2228      No chief complaint on file.   (Consider location/radiation/quality/duration/timing/severity/associated sxs/prior treatment) The history is provided by the patient and medical records.   she has oxygen-dependent COPD, and continues to smoke.  She complains of progressive shortness of breath, or productive cough, and brown sputum for the past few, days.  She has back pain, as well.  She denies chest pain.  She denies nausea, vomiting, fevers, chills, sweating.  She denies leg pain or swelling.  Past Medical History  Diagnosis Date  . Pacemaker     vent paced  . COPD (chronic obstructive pulmonary disease)   . CHF (congestive heart failure)   . Thyroid disease   . Hepatitis     Hep C  . MI (myocardial infarction)     Past Surgical History  Procedure Date  . Cholecystectomy   . Pacemaker insertion     No family history on file.  History  Substance Use Topics  . Smoking status: Current Everyday Smoker -- 0.5 packs/day  . Smokeless tobacco: Not on file  . Alcohol Use: No    OB History    Grav Para Term Preterm Abortions TAB SAB Ect Mult Living                  Review of Systems  Constitutional: Negative for fever, chills and diaphoresis.  HENT: Negative for congestion.   Eyes: Negative for redness.  Respiratory: Positive for cough and shortness of breath. Negative for chest tightness.   Cardiovascular: Negative for chest pain, palpitations and leg swelling.  Gastrointestinal: Negative for nausea and vomiting.  Genitourinary: Negative for dysuria.  Musculoskeletal: Positive for back pain.  Skin: Negative for rash.  Neurological: Negative for headaches.  Hematological: Does not bruise/bleed easily.  Psychiatric/Behavioral: Negative for confusion.  All other systems reviewed and are negative.    Allergies  Vesicare  Home Medications    Current Outpatient Rx  Name Route Sig Dispense Refill  . ALBUTEROL SULFATE HFA 108 (90 BASE) MCG/ACT IN AERS Inhalation Inhale 2 puffs into the lungs every 6 (six) hours as needed.    [place] IN Inhalation Inhale 2 puffs into the lungs 2 (two) times daily.    Marland Kitchen CALCIUM CARBONATE 600 MG PO TABS Oral Take 1,200 mg by mouth every morning.    Marland Kitchen CLOTRIMAZOLE-BETAMETHASONE 1-0.05 % EX CREA Topical Apply 1 application topically daily. For places in scalp    . DIPHENHYDRAMINE-APAP (SLEEP) 25-500 MG PO TABS Oral Take 2 tablets by mouth daily as needed. For pain    . ESOMEPRAZOLE MAGNESIUM 40 MG PO CPDR Oral Take 40 mg by mouth every morning.    Marland Kitchen FERROUS SULFATE 325 (65 FE) MG PO TABS Oral Take 650 mg by mouth every morning.    . FUROSEMIDE 80 MG PO TABS Oral Take 80 mg by mouth every morning.    Marland Kitchen HYDROXYZINE HCL 10 MG PO TABS Oral Take 10 mg by mouth 3 (three) times daily as needed. For itching and/or anxiety    . METFORMIN HCL 500 MG PO TABS Oral Take 500 mg by mouth 2 (two) times daily with a meal.    . METOCLOPRAMIDE HCL 10 MG PO TABS Oral Take 10 mg by mouth every 8 (eight) hours as needed. For nausea and indigestion    . OXYCODONE HCL ER 10 MG PO  TB12 Oral Take 10 mg by mouth daily as needed. For pain    . TIOTROPIUM BROMIDE MONOHYDRATE 18 MCG IN CAPS Inhalation Place 18 mcg into inhaler and inhale daily.    . WARFARIN SODIUM 5 MG PO TABS Oral Take 5 mg by mouth daily.      BP 140/82  Pulse 90  Temp 99.1 F (37.3 C) (Oral)  Resp 20  SpO2 97%  Physical Exam  Nursing note and vitals reviewed. Constitutional: She is oriented to person, place, and time. No distress.       Morbidly obese  HENT:  Head: Normocephalic and atraumatic.  Eyes: Conjunctivae and EOM are normal.  Neck: Normal range of motion. Neck supple.  Cardiovascular: Normal rate.   No murmur heard. Pulmonary/Chest: Effort normal and breath sounds normal. No respiratory distress. She has no wheezes. She has no rales.   Abdominal: Soft. She exhibits no distension. There is no tenderness.  Musculoskeletal: Normal range of motion. She exhibits no edema and no tenderness.  Neurological: She is alert and oriented to person, place, and time.  Skin: Skin is warm and dry.  Psychiatric: She has a normal mood and affect. Thought content normal.    ED Course  Procedures (including critical care time) productive cough with increasing shortness of breath, and, female, with oxygen-dependent COPD.  We will perform chest x-ray, and laboratory testing, for evaluation of possible pneumonia. Labs Reviewed  CBC WITH DIFFERENTIAL - Abnormal; Notable for the following:    WBC 11.2 (*)     RDW 19.1 (*)     Neutro Abs 8.5 (*)     Monocytes Absolute 1.1 (*)     All other components within normal limits  COMPREHENSIVE METABOLIC PANEL - Abnormal; Notable for the following:    Glucose, Bld 112 (*)     Total Bilirubin 1.5 (*)     All other components within normal limits  D-DIMER, QUANTITATIVE  POCT I-STAT TROPONIN I   Dg Chest 2 View  07/15/2012  *RADIOLOGY REPORT*  Clinical Data: Chest pain.  Shortness of breath.  Nausea and vomiting.  History of CHF, COPD, and hepatitis C.  CHEST - 2 VIEW  Comparison: Two-view chest x-ray 05/30/2012, 01/20/2009, and portable chest x-ray 12/24/2008.  Findings: Cardiac silhouette markedly enlarged but stable.  Left subclavian dual lead transvenous pacemaker unchanged and appears intact.  Hilar and mediastinal contours otherwise unremarkable. Interval development of airspace consolidation in the right lower lobe since the 05/30/2012 examination.  Associated small right pleural effusion.  Lungs otherwise clear.  Pulmonary vascularity normal.  No left pleural effusion.  Mild degenerative changes involving the thoracic spine.  IMPRESSION: Right lower lobe pneumonia and associated small right pleural effusion.  Stable cardiomegaly without pulmonary edema.  Original Report Authenticated By: Arnell Sieving, M.D.   ECG Vent paced rhythm HR 88 Left axis Wide complex associated with paced rhythm Nonspecific tw changes associated with paced rhythm  No diagnosis found.  Pneumonia in patient with known COPD.  She is not hypoxic, oxygen, which she has at home.  She is in no respiratory distress.  We will treat her with antibiotics and release her to home. MDM  Pneumonia. COPD No evidence of respiratory distress or toxicity.  No hypoxia, while on oxygen, which she has at home.        Cheri Guppy, MD 07/15/12 4098  Cheri Guppy, MD 07/15/12 [phone]

## 2012-07-16 NOTE — ED Notes (Signed)
PTAR paged. 

## 2012-07-20 ENCOUNTER — Emergency Department (HOSPITAL_COMMUNITY): Payer: Medicaid Other

## 2012-07-20 ENCOUNTER — Encounter (HOSPITAL_COMMUNITY): Payer: Self-pay

## 2012-07-20 ENCOUNTER — Inpatient Hospital Stay (HOSPITAL_COMMUNITY)
Admission: EM | Admit: 2012-07-20 | Discharge: 2012-07-23 | DRG: 287 | Disposition: A | Discharge status: Home / Self Care | Payer: Medicaid Other | Attending: Internal Medicine | Admitting: Internal Medicine

## 2012-07-20 DIAGNOSIS — F17200 Nicotine dependence, unspecified, uncomplicated: Secondary | ICD-10-CM | POA: Diagnosis present

## 2012-07-20 DIAGNOSIS — R079 Chest pain, unspecified: Secondary | ICD-10-CM

## 2012-07-20 DIAGNOSIS — I4892 Unspecified atrial flutter: Secondary | ICD-10-CM | POA: Diagnosis present

## 2012-07-20 DIAGNOSIS — E119 Type 2 diabetes mellitus without complications: Secondary | ICD-10-CM | POA: Diagnosis present

## 2012-07-20 DIAGNOSIS — E876 Hypokalemia: Secondary | ICD-10-CM | POA: Diagnosis present

## 2012-07-20 DIAGNOSIS — Z888 Allergy status to other drugs, medicaments and biological substances status: Secondary | ICD-10-CM

## 2012-07-20 DIAGNOSIS — L408 Other psoriasis: Secondary | ICD-10-CM | POA: Diagnosis present

## 2012-07-20 DIAGNOSIS — I5033 Acute on chronic diastolic (congestive) heart failure: Principal | ICD-10-CM | POA: Diagnosis present

## 2012-07-20 DIAGNOSIS — Z9089 Acquired absence of other organs: Secondary | ICD-10-CM

## 2012-07-20 DIAGNOSIS — M549 Dorsalgia, unspecified: Secondary | ICD-10-CM | POA: Diagnosis present

## 2012-07-20 DIAGNOSIS — E039 Hypothyroidism, unspecified: Secondary | ICD-10-CM | POA: Diagnosis present

## 2012-07-20 DIAGNOSIS — Z8249 Family history of ischemic heart disease and other diseases of the circulatory system: Secondary | ICD-10-CM

## 2012-07-20 DIAGNOSIS — I2510 Atherosclerotic heart disease of native coronary artery without angina pectoris: Secondary | ICD-10-CM | POA: Diagnosis present

## 2012-07-20 DIAGNOSIS — I4891 Unspecified atrial fibrillation: Secondary | ICD-10-CM | POA: Diagnosis present

## 2012-07-20 DIAGNOSIS — B1920 Unspecified viral hepatitis C without hepatic coma: Secondary | ICD-10-CM | POA: Diagnosis present

## 2012-07-20 DIAGNOSIS — K7460 Unspecified cirrhosis of liver: Secondary | ICD-10-CM | POA: Diagnosis present

## 2012-07-20 DIAGNOSIS — I509 Heart failure, unspecified: Secondary | ICD-10-CM | POA: Diagnosis present

## 2012-07-20 DIAGNOSIS — K625 Hemorrhage of anus and rectum: Secondary | ICD-10-CM | POA: Diagnosis not present

## 2012-07-20 DIAGNOSIS — J449 Chronic obstructive pulmonary disease, unspecified: Secondary | ICD-10-CM | POA: Diagnosis present

## 2012-07-20 DIAGNOSIS — I5043 Acute on chronic combined systolic (congestive) and diastolic (congestive) heart failure: Secondary | ICD-10-CM | POA: Diagnosis present

## 2012-07-20 DIAGNOSIS — L989 Disorder of the skin and subcutaneous tissue, unspecified: Secondary | ICD-10-CM | POA: Diagnosis present

## 2012-07-20 DIAGNOSIS — Z8711 Personal history of peptic ulcer disease: Secondary | ICD-10-CM

## 2012-07-20 DIAGNOSIS — T502X5A Adverse effect of carbonic-anhydrase inhibitors, benzothiadiazides and other diuretics, initial encounter: Secondary | ICD-10-CM | POA: Diagnosis not present

## 2012-07-20 DIAGNOSIS — I252 Old myocardial infarction: Secondary | ICD-10-CM

## 2012-07-20 DIAGNOSIS — G8929 Other chronic pain: Secondary | ICD-10-CM | POA: Diagnosis present

## 2012-07-20 DIAGNOSIS — J4489 Other specified chronic obstructive pulmonary disease: Secondary | ICD-10-CM | POA: Diagnosis present

## 2012-07-20 DIAGNOSIS — I10 Essential (primary) hypertension: Secondary | ICD-10-CM | POA: Diagnosis present

## 2012-07-20 DIAGNOSIS — K219 Gastro-esophageal reflux disease without esophagitis: Secondary | ICD-10-CM | POA: Diagnosis present

## 2012-07-20 DIAGNOSIS — Z9981 Dependence on supplemental oxygen: Secondary | ICD-10-CM

## 2012-07-20 DIAGNOSIS — R32 Unspecified urinary incontinence: Secondary | ICD-10-CM | POA: Diagnosis present

## 2012-07-20 DIAGNOSIS — Z950 Presence of cardiac pacemaker: Secondary | ICD-10-CM

## 2012-07-20 DIAGNOSIS — R51 Headache: Secondary | ICD-10-CM | POA: Diagnosis not present

## 2012-07-20 HISTORY — DX: Gastro-esophageal reflux disease without esophagitis: K21.9

## 2012-07-20 HISTORY — DX: Unspecified cirrhosis of liver: K74.60

## 2012-07-20 HISTORY — DX: Anxiety disorder, unspecified: F41.9

## 2012-07-20 HISTORY — DX: Pneumonia, unspecified organism: J18.9

## 2012-07-20 HISTORY — DX: Headache: R51

## 2012-07-20 LAB — RAPID URINE DRUG SCREEN, HOSP PERFORMED
Amphetamines: NOT DETECTED
Benzodiazepines: NOT DETECTED
Cocaine: NOT DETECTED

## 2012-07-20 LAB — COMPREHENSIVE METABOLIC PANEL
ALT: 24 U/L (ref 0–35)
AST: 36 U/L (ref 0–37)
Albumin: 3.7 g/dL (ref 3.5–5.2)
Alkaline Phosphatase: 53 U/L (ref 39–117)
Chloride: 100 mEq/L (ref 96–112)
Potassium: 3.9 mEq/L (ref 3.5–5.1)
Sodium: 139 mEq/L (ref 135–145)
Total Bilirubin: 1.3 mg/dL — ABNORMAL HIGH (ref 0.3–1.2)

## 2012-07-20 LAB — CBC
MCH: 30.9 pg (ref 26.0–34.0)
MCHC: 32.3 g/dL (ref 30.0–36.0)
Platelets: 284 10*3/uL (ref 150–400)
Platelets: 295 10*3/uL (ref 150–400)
RDW: 18.9 % — ABNORMAL HIGH (ref 11.5–15.5)
WBC: 9.8 10*3/uL (ref 4.0–10.5)

## 2012-07-20 LAB — CK TOTAL AND CKMB (NOT AT ARMC)
CK, MB: 6.6 ng/mL (ref 0.3–4.0)
Total CK: 74 U/L (ref 7–177)

## 2012-07-20 LAB — TSH: TSH: 6.949 u[IU]/mL — ABNORMAL HIGH (ref 0.350–4.500)

## 2012-07-20 LAB — LIPID PANEL
HDL: 23 mg/dL — ABNORMAL LOW (ref >39)
LDL Cholesterol: 71 mg/dL (ref 0–99)
Triglycerides: 105 mg/dL (ref <150)

## 2012-07-20 LAB — POCT I-STAT 3, ART BLOOD GAS (G3+)
Acid-Base Excess: 1 mmol/L (ref 0.0–2.0)
Bicarbonate: 26.8 mEq/L — ABNORMAL HIGH (ref 20.0–24.0)

## 2012-07-20 LAB — GLUCOSE, CAPILLARY
Glucose-Capillary: 103 mg/dL — ABNORMAL HIGH (ref 70–99)
Glucose-Capillary: 153 mg/dL — ABNORMAL HIGH (ref 70–99)

## 2012-07-20 LAB — MRSA PCR SCREENING: MRSA by PCR: NEGATIVE

## 2012-07-20 LAB — PRO B NATRIURETIC PEPTIDE: Pro B Natriuretic peptide (BNP): 3429 pg/mL — ABNORMAL HIGH (ref 0–125)

## 2012-07-20 LAB — HEMOGLOBIN A1C: Hgb A1c MFr Bld: 6 % — ABNORMAL HIGH (ref <5.7)

## 2012-07-20 LAB — CREATININE, SERUM: Creatinine, Ser: 0.85 mg/dL (ref 0.50–1.10)

## 2012-07-20 LAB — CARDIAC PANEL(CRET KIN+CKTOT+MB+TROPI)
CK, MB: 6.7 ng/mL (ref 0.3–4.0)
CK, MB: 6.4 ng/mL (ref 0.3–4.0)
Relative Index: 6.4 — ABNORMAL HIGH (ref 0.0–2.5)
Total CK: 100 U/L (ref 7–177)

## 2012-07-20 LAB — PROTIME-INR: Prothrombin Time: 16.4 seconds — ABNORMAL HIGH (ref 11.6–15.2)

## 2012-07-20 MED ORDER — MORPHINE SULFATE 4 MG/ML IJ SOLN
4.0000 mg | Freq: Once | INTRAMUSCULAR | Status: AC
  Administered 2012-07-20: 4 mg via INTRAVENOUS
  Filled 2012-07-20: qty 1

## 2012-07-20 MED ORDER — ATORVASTATIN CALCIUM 20 MG PO TABS
20.0000 mg | ORAL_TABLET | Freq: Every day | ORAL | Status: DC
  Administered 2012-07-21: 20 mg via ORAL
  Filled 2012-07-20 (×4): qty 1

## 2012-07-20 MED ORDER — HEPARIN (PORCINE) IN NACL 100-0.45 UNIT/ML-% IJ SOLN
1800.0000 [IU]/h | INTRAMUSCULAR | Status: DC
  Administered 2012-07-20: 1150 [IU]/h via INTRAVENOUS
  Administered 2012-07-21: 1500 [IU]/h via INTRAVENOUS
  Administered 2012-07-21 – 2012-07-22 (×2): 1800 [IU]/h via INTRAVENOUS
  Filled 2012-07-20 (×5): qty 250

## 2012-07-20 MED ORDER — METOCLOPRAMIDE HCL 5 MG/ML IJ SOLN
10.0000 mg | Freq: Three times a day (TID) | INTRAMUSCULAR | Status: DC | PRN
  Administered 2012-07-20: 10 mg via INTRAVENOUS
  Filled 2012-07-20: qty 2

## 2012-07-20 MED ORDER — ASPIRIN EC 81 MG PO TBEC
81.0000 mg | DELAYED_RELEASE_TABLET | Freq: Every day | ORAL | Status: DC
  Administered 2012-07-20 – 2012-07-23 (×4): 81 mg via ORAL
  Filled 2012-07-20 (×4): qty 1

## 2012-07-20 MED ORDER — WARFARIN SODIUM 5 MG PO TABS
5.0000 mg | ORAL_TABLET | Freq: Every day | ORAL | Status: DC
  Filled 2012-07-20: qty 1

## 2012-07-20 MED ORDER — METOCLOPRAMIDE HCL 10 MG PO TABS
10.0000 mg | ORAL_TABLET | Freq: Three times a day (TID) | ORAL | Status: DC | PRN
  Filled 2012-07-20: qty 1

## 2012-07-20 MED ORDER — CALCIUM CARBONATE 1250 (500 CA) MG PO TABS
1250.0000 mg | ORAL_TABLET | Freq: Every day | ORAL | Status: DC
  Administered 2012-07-20 – 2012-07-23 (×4): 1250 mg via ORAL
  Filled 2012-07-20 (×4): qty 1

## 2012-07-20 MED ORDER — FUROSEMIDE 10 MG/ML IJ SOLN
60.0000 mg | Freq: Once | INTRAMUSCULAR | Status: AC
  Administered 2012-07-20: 60 mg via INTRAVENOUS
  Filled 2012-07-20: qty 6

## 2012-07-20 MED ORDER — ONDANSETRON HCL 4 MG/2ML IJ SOLN
4.0000 mg | Freq: Once | INTRAMUSCULAR | Status: AC
  Administered 2012-07-20: 4 mg via INTRAVENOUS
  Filled 2012-07-20: qty 2

## 2012-07-20 MED ORDER — HYDROXYZINE HCL 10 MG PO TABS
10.0000 mg | ORAL_TABLET | Freq: Three times a day (TID) | ORAL | Status: DC | PRN
  Administered 2012-07-20 (×2): 10 mg via ORAL
  Filled 2012-07-20 (×4): qty 1

## 2012-07-20 MED ORDER — HEPARIN BOLUS VIA INFUSION
4000.0000 [IU] | Freq: Once | INTRAVENOUS | Status: AC
  Administered 2012-07-20: 4000 [IU] via INTRAVENOUS
  Filled 2012-07-20: qty 4000

## 2012-07-20 MED ORDER — REGADENOSON 0.4 MG/5ML IV SOLN
0.4000 mg | Freq: Once | INTRAVENOUS | Status: AC
  Administered 2012-07-21: 0.4 mg via INTRAVENOUS
  Filled 2012-07-20: qty 5

## 2012-07-20 MED ORDER — ASPIRIN 81 MG PO CHEW
81.0000 mg | CHEWABLE_TABLET | Freq: Once | ORAL | Status: AC
  Administered 2012-07-20: 81 mg via ORAL
  Filled 2012-07-20: qty 1

## 2012-07-20 MED ORDER — BUDESONIDE-FORMOTEROL FUMARATE 80-4.5 MCG/ACT IN AERO
2.0000 | INHALATION_SPRAY | Freq: Two times a day (BID) | RESPIRATORY_TRACT | Status: DC
  Administered 2012-07-20 – 2012-07-23 (×5): 2 via RESPIRATORY_TRACT
  Filled 2012-07-20 (×2): qty 6.9

## 2012-07-20 MED ORDER — FERROUS SULFATE 325 (65 FE) MG PO TABS
650.0000 mg | ORAL_TABLET | Freq: Every day | ORAL | Status: DC
  Administered 2012-07-20 – 2012-07-23 (×4): 650 mg via ORAL
  Filled 2012-07-20 (×4): qty 2

## 2012-07-20 MED ORDER — WARFARIN - PHYSICIAN DOSING INPATIENT: Freq: Every day | Status: DC

## 2012-07-20 MED ORDER — ONDANSETRON HCL 4 MG/2ML IJ SOLN
4.0000 mg | Freq: Four times a day (QID) | INTRAMUSCULAR | Status: DC | PRN
  Administered 2012-07-20 – 2012-07-21 (×2): 4 mg via INTRAVENOUS
  Filled 2012-07-20: qty 2

## 2012-07-20 MED ORDER — OXYCODONE HCL 10 MG PO TB12
10.0000 mg | ORAL_TABLET | Freq: Every day | ORAL | Status: DC | PRN
Start: 2012-07-20 — End: 2012-07-23
  Administered 2012-07-20 – 2012-07-23 (×5): 10 mg via ORAL
  Filled 2012-07-20 (×5): qty 1

## 2012-07-20 MED ORDER — CLOBETASOL PROPIONATE 0.05 % EX CREA
TOPICAL_CREAM | Freq: Two times a day (BID) | CUTANEOUS | Status: DC
  Administered 2012-07-20: 21:00:00 via TOPICAL
  Administered 2012-07-20: 1 via TOPICAL
  Administered 2012-07-21 – 2012-07-23 (×5): via TOPICAL
  Filled 2012-07-20 (×2): qty 15

## 2012-07-20 MED ORDER — INSULIN ASPART 100 UNIT/ML ~~LOC~~ SOLN
0.0000 [IU] | SUBCUTANEOUS | Status: DC
  Administered 2012-07-20: 2 [IU] via SUBCUTANEOUS

## 2012-07-20 MED ORDER — NICOTINE 14 MG/24HR TD PT24
14.0000 mg | MEDICATED_PATCH | Freq: Every day | TRANSDERMAL | Status: DC
  Administered 2012-07-20: 14 mg via TRANSDERMAL
  Filled 2012-07-20 (×4): qty 1

## 2012-07-20 MED ORDER — FUROSEMIDE 10 MG/ML IJ SOLN
100.0000 mg | Freq: Once | INTRAMUSCULAR | Status: AC
  Administered 2012-07-20: 100 mg via INTRAVENOUS
  Filled 2012-07-20: qty 10

## 2012-07-20 MED ORDER — PANTOPRAZOLE SODIUM 40 MG PO TBEC
40.0000 mg | DELAYED_RELEASE_TABLET | Freq: Every day | ORAL | Status: DC
  Administered 2012-07-20 – 2012-07-23 (×4): 40 mg via ORAL
  Filled 2012-07-20 (×4): qty 1

## 2012-07-20 MED ORDER — TIOTROPIUM BROMIDE MONOHYDRATE 18 MCG IN CAPS
18.0000 ug | ORAL_CAPSULE | Freq: Every day | RESPIRATORY_TRACT | Status: DC
  Administered 2012-07-20 – 2012-07-23 (×3): 18 ug via RESPIRATORY_TRACT
  Filled 2012-07-20: qty 5

## 2012-07-20 MED ORDER — CALCIUM CARBONATE 600 MG PO TABS
1200.0000 mg | ORAL_TABLET | Freq: Every day | ORAL | Status: DC
Start: 2012-07-20 — End: 2012-07-20
  Filled 2012-07-20: qty 2

## 2012-07-20 MED ORDER — ALBUTEROL SULFATE HFA 108 (90 BASE) MCG/ACT IN AERS
2.0000 | INHALATION_SPRAY | Freq: Four times a day (QID) | RESPIRATORY_TRACT | Status: DC | PRN
  Administered 2012-07-20: 2 via RESPIRATORY_TRACT
  Filled 2012-07-20: qty 6.7

## 2012-07-20 MED ORDER — KETOROLAC TROMETHAMINE 10 MG PO TABS
10.0000 mg | ORAL_TABLET | Freq: Three times a day (TID) | ORAL | Status: DC
  Administered 2012-07-20 – 2012-07-23 (×8): 10 mg via ORAL
  Filled 2012-07-20 (×12): qty 1

## 2012-07-20 MED ORDER — ALBUTEROL SULFATE (5 MG/ML) 0.5% IN NEBU
INHALATION_SOLUTION | RESPIRATORY_TRACT | Status: AC
  Administered 2012-07-20: 5 mg
  Filled 2012-07-20: qty 1

## 2012-07-20 MED ORDER — ONDANSETRON HCL 4 MG PO TABS
4.0000 mg | ORAL_TABLET | Freq: Four times a day (QID) | ORAL | Status: DC | PRN
  Administered 2012-07-20 – 2012-07-22 (×2): 4 mg via ORAL
  Filled 2012-07-20 (×3): qty 1

## 2012-07-20 MED ORDER — LISINOPRIL 5 MG PO TABS
5.0000 mg | ORAL_TABLET | Freq: Every day | ORAL | Status: DC
  Administered 2012-07-20 – 2012-07-23 (×4): 5 mg via ORAL
  Filled 2012-07-20 (×4): qty 1

## 2012-07-20 MED ORDER — NITROGLYCERIN IN D5W 200-5 MCG/ML-% IV SOLN
2.0000 ug/min | INTRAVENOUS | Status: DC
  Administered 2012-07-20: 5 ug/min via INTRAVENOUS
  Administered 2012-07-20: 10 ug/min via INTRAVENOUS
  Filled 2012-07-20 (×2): qty 250
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
## 2012-07-20 NOTE — ED Notes (Signed)
Pt is from home with complaints of difficulty breathing and midsternal chest pain x 2 days, pt given nitro 5 SL and albuterol treatment in route. Pt was placed on CPAP in route by EMS, pt also complaints of non productive coughing, n/v with possible constipation. Pt recently diagnosed with pneumonia on last Friday at [facility].

## 2012-07-20 NOTE — ED Notes (Signed)
Patient stated she had the chills.  Took Patient's temp 97.0, Asked Patient if she would like a blanket and she refused.
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
## 2012-07-20 NOTE — Progress Notes (Signed)
CRITICAL VALUE ALERT  Critical value received:  Troponin 0.36, CKMB 6.4  Date of notification:  07/20/12  Time of notification:  1956  Critical value read back:yes  Nurse who received alert:  D. Madelin Rear, RN  MD notified (1st page):  Dr. Tonny Branch  Time of first page:  1956  MD notified (2nd page):  Time of second page:  Responding MD:  Dr. Tonny Branch  Time MD responded:  2000
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
## 2012-07-20 NOTE — ED Provider Notes (Addendum)
History     CSN: [id number]  Arrival date & time 07/20/12  0405   First MD Initiated Contact with Patient 07/20/12 0408      Chief Complaint  Patient presents with  . Respiratory Distress  . Chest Pain    (Consider location/radiation/quality/duration/timing/severity/associated sxs/prior treatment) Patient is a 46 y.o. female presenting with chest pain. The history is provided by the patient.  Chest Pain The chest pain began 3 - 5 hours ago. Duration of episode(s) is 2 hours. Chest pain occurs frequently. The chest pain is unchanged. At its most intense, the pain is at 3/10. The pain is currently at 3/10. The severity of the pain is moderate. The quality of the pain is described as aching. Primary symptoms include cough, wheezing and nausea. She tried nitroglycerin for the symptoms.  Her past medical history is significant for CAD, cancer, COPD and CHF.     Past Medical History  Diagnosis Date  . Pacemaker     vent paced  . COPD (chronic obstructive pulmonary disease)   . CHF (congestive heart failure)   . Thyroid disease   . Hepatitis     Hep C  . MI (myocardial infarction)   . Pneumonia     Past Surgical History  Procedure Date  . Cholecystectomy   . Pacemaker insertion     No family history on file.  History  Substance Use Topics  . Smoking status: Current Everyday Smoker -- 0.5 packs/day  . Smokeless tobacco: Not on file  . Alcohol Use: No    OB History    Grav Para Term Preterm Abortions TAB SAB Ect Mult Living                  Review of Systems  Respiratory: Positive for cough and wheezing.   Cardiovascular: Positive for chest pain.  Gastrointestinal: Positive for nausea.  All other systems reviewed and are negative.    Allergies  Vesicare  Home Medications   Current Outpatient Rx  Name Route Sig Dispense Refill  . ALBUTEROL SULFATE HFA 108 (90 BASE) MCG/ACT IN AERS Inhalation Inhale 2 puffs into the lungs every 6 (six) hours as needed.      [place] IN Inhalation Inhale 2 puffs into the lungs 2 (two) times daily.    Marland Kitchen CALCIUM CARBONATE 600 MG PO TABS Oral Take 1,200 mg by mouth every morning.    Marland Kitchen HYDROCOD POLST-CPM POLST ER 10-8 MG/5ML PO LQCR Oral Take 5 mLs by mouth every 12 (twelve) hours as needed. 140 mL 0  . CLOTRIMAZOLE-BETAMETHASONE 1-0.05 % EX CREA Topical Apply 1 application topically daily. For places in scalp    . DIPHENHYDRAMINE-APAP (SLEEP) 25-500 MG PO TABS Oral Take 2 tablets by mouth daily as needed. For pain    . ESOMEPRAZOLE MAGNESIUM 40 MG PO CPDR Oral Take 40 mg by mouth every morning.    Marland Kitchen FERROUS SULFATE 325 (65 FE) MG PO TABS Oral Take 650 mg by mouth every morning.    . FUROSEMIDE 80 MG PO TABS Oral Take 80 mg by mouth every morning.    Marland Kitchen HYDROXYZINE HCL 10 MG PO TABS Oral Take 10 mg by mouth 3 (three) times daily as needed. For itching and/or anxiety    . LEVOFLOXACIN 500 MG PO TABS Oral Take 1 tablet (500 mg total) by mouth daily. 10 tablet 0  . METFORMIN HCL 500 MG PO TABS Oral Take 500 mg by mouth 2 (two) times daily with a meal.    .  METOCLOPRAMIDE HCL 10 MG PO TABS Oral Take 10 mg by mouth every 8 (eight) hours as needed. For nausea and indigestion    . OXYCODONE HCL ER 10 MG PO TB12 Oral Take 10 mg by mouth daily as needed. For pain    . TIOTROPIUM BROMIDE MONOHYDRATE 18 MCG IN CAPS Inhalation Place 18 mcg into inhaler and inhale daily.    . WARFARIN SODIUM 5 MG PO TABS Oral Take 5 mg by mouth daily.      BP 154/101  Temp 97.7 F (36.5 C) (Oral)  Resp 27  Ht 5\' 7"  (1.702 m)  Wt 232 lb (105.235 kg)  BMI 36.34 kg/m2  SpO2 97%  Physical Exam  Constitutional: She is oriented to person, place, and time. She appears well-developed and well-nourished.  HENT:  Head: Normocephalic and atraumatic.  Eyes: Conjunctivae and EOM are normal. Pupils are equal, round, and reactive to light.  Neck: Normal range of motion.  Cardiovascular: Normal rate, regular rhythm and normal heart sounds.    Pulmonary/Chest: Effort normal. She has wheezes. She has rales. She exhibits tenderness.  Abdominal: Soft. Bowel sounds are normal.  Musculoskeletal: Normal range of motion.  Neurological: She is alert and oriented to person, place, and time.  Skin: Skin is warm and dry. Rash noted.       Multiple excoriated lesions to body  Psychiatric: She has a normal mood and affect. Her behavior is normal.    ED Course  Procedures (including critical care time)  Labs Reviewed  CBC - Abnormal; Notable for the following:    RDW 18.9 (*)     All other components within normal limits  POCT I-STAT 3, BLOOD GAS (G3+) - Abnormal; Notable for the following:    pO2, Arterial 78.0 (*)     Bicarbonate 26.8 (*)     All other components within normal limits  COMPREHENSIVE METABOLIC PANEL  APTT  PROTIME-INR  TROPONIN I  CK TOTAL AND CKMB  PRO B NATRIURETIC PEPTIDE   No results found.   No diagnosis found.   Date: 07/20/2012  Rate: 100  Rhythm: paced  QRS Axis: left  Intervals: widen qrs  ST/T Wave abnormalities: nonspecific ST changes  Conduction Disutrbances:left bundle branch block  Narrative Interpretation: aflutter,  paced  Old EKG Reviewed: unchanged   MDM  + dyspnea, off meds x 3wks,  Now with chest pain,  Dyspnea.  Will reassess  +chf,  Cp. Will admit.  Discussed with teaching service.  Will eval pt for admission      Rosanne Ashing, MD 07/20/12 0444  Aleyza Salmi Lytle Michaels, MD 07/20/12 [phone]

## 2012-07-20 NOTE — Progress Notes (Signed)
Utilization review completed.  

## 2012-07-21 ENCOUNTER — Inpatient Hospital Stay (HOSPITAL_COMMUNITY): Payer: Medicaid Other

## 2012-07-21 DIAGNOSIS — R079 Chest pain, unspecified: Secondary | ICD-10-CM

## 2012-07-21 DIAGNOSIS — I509 Heart failure, unspecified: Secondary | ICD-10-CM

## 2012-07-21 DIAGNOSIS — E876 Hypokalemia: Secondary | ICD-10-CM | POA: Diagnosis present

## 2012-07-21 LAB — BASIC METABOLIC PANEL
BUN: 10 mg/dL (ref 6–23)
CO2: 30 mEq/L (ref 19–32)
Chloride: 97 mEq/L (ref 96–112)
GFR calc Af Amer: 81 mL/min — ABNORMAL LOW (ref >90)
GFR calc Af Amer: 77 mL/min — ABNORMAL LOW (ref >90)
GFR calc non Af Amer: 70 mL/min — ABNORMAL LOW (ref >90)
Potassium: 3 mEq/L — ABNORMAL LOW (ref 3.5–5.1)
Potassium: 3.4 mEq/L — ABNORMAL LOW (ref 3.5–5.1)
Sodium: 139 mEq/L (ref 135–145)
Sodium: 137 mEq/L (ref 135–145)

## 2012-07-21 LAB — GLUCOSE, CAPILLARY
Glucose-Capillary: 101 mg/dL — ABNORMAL HIGH (ref 70–99)
Glucose-Capillary: 109 mg/dL — ABNORMAL HIGH (ref 70–99)
Glucose-Capillary: 120 mg/dL — ABNORMAL HIGH (ref 70–99)
Glucose-Capillary: 77 mg/dL (ref 70–99)

## 2012-07-21 LAB — CARDIAC PANEL(CRET KIN+CKTOT+MB+TROPI)
CK, MB: 6.2 ng/mL (ref 0.3–4.0)
Relative Index: 4.2 — ABNORMAL HIGH (ref 0.0–2.5)
Relative Index: 2.2 (ref 0.0–2.5)
Total CK: 129 U/L (ref 7–177)
Total CK: 149 U/L (ref 7–177)
Total CK: 206 U/L — ABNORMAL HIGH (ref 7–177)
Total CK: 291 U/L — ABNORMAL HIGH (ref 7–177)
Troponin I: <0.3 ng/mL (ref <0.30)

## 2012-07-21 LAB — CBC
HCT: 33.5 % — ABNORMAL LOW (ref 36.0–46.0)
Hemoglobin: 10.7 g/dL — ABNORMAL LOW (ref 12.0–15.0)
MCV: 96 fL (ref 78.0–100.0)
RBC: 3.49 MIL/uL — ABNORMAL LOW (ref 3.87–5.11)
RDW: 19.2 % — ABNORMAL HIGH (ref 11.5–15.5)
WBC: 8.3 10*3/uL (ref 4.0–10.5)

## 2012-07-21 LAB — MAGNESIUM: Magnesium: 1.1 mg/dL — ABNORMAL LOW (ref 1.5–2.5)

## 2012-07-21 MED ORDER — LEVOTHYROXINE SODIUM 25 MCG PO TABS: 25.0000 ug | ORAL_TABLET | Freq: Every day | ORAL | Status: DC

## 2012-07-21 MED ORDER — FUROSEMIDE 80 MG PO TABS
80.0000 mg | ORAL_TABLET | Freq: Every day | ORAL | Status: DC
  Administered 2012-07-21: 80 mg via ORAL
  Filled 2012-07-21 (×2): qty 1

## 2012-07-21 MED ORDER — HEPARIN BOLUS VIA INFUSION
2000.0000 [IU] | Freq: Once | INTRAVENOUS | Status: AC
  Administered 2012-07-21: 2000 [IU] via INTRAVENOUS
  Filled 2012-07-21: qty 2000

## 2012-07-21 MED ORDER — WARFARIN SODIUM 7.5 MG PO TABS
7.5000 mg | ORAL_TABLET | Freq: Once | ORAL | Status: AC
  Administered 2012-07-21: 7.5 mg via ORAL
  Filled 2012-07-21: qty 1

## 2012-07-21 MED ORDER — MAGNESIUM SULFATE 40 MG/ML IJ SOLN
2.0000 g | Freq: Once | INTRAMUSCULAR | Status: AC
  Administered 2012-07-21: 2 g via INTRAVENOUS
  Filled 2012-07-21: qty 50

## 2012-07-21 MED ORDER — ACETAMINOPHEN 325 MG PO TABS: 650.0000 mg | ORAL_TABLET | Freq: Four times a day (QID) | ORAL | Status: DC | PRN

## 2012-07-21 MED ORDER — POTASSIUM CHLORIDE CRYS ER 20 MEQ PO TBCR
40.0000 meq | EXTENDED_RELEASE_TABLET | Freq: Once | ORAL | Status: AC
  Administered 2012-07-21: 40 meq via ORAL
  Filled 2012-07-21: qty 2

## 2012-07-21 MED ORDER — HEPARIN BOLUS VIA INFUSION
3000.0000 [IU] | Freq: Once | INTRAVENOUS | Status: AC
  Administered 2012-07-21: 3000 [IU] via INTRAVENOUS
  Filled 2012-07-21: qty 3000

## 2012-07-21 MED ORDER — LEVOTHYROXINE SODIUM 25 MCG PO TABS
25.0000 ug | ORAL_TABLET | Freq: Every day | ORAL | Status: DC
  Administered 2012-07-21 – 2012-07-23 (×3): 25 ug via ORAL
  Filled 2012-07-21 (×4): qty 1

## 2012-07-21 MED ORDER — MAGNESIUM SULFATE 50 % IJ SOLN: 2.0000 g | Freq: Once | INTRAMUSCULAR | Status: DC

## 2012-07-21 MED ORDER — WARFARIN - PHARMACIST DOSING INPATIENT
Freq: Every day | Status: DC
  Administered 2012-07-21: 18:00:00

## 2012-07-21 MED ORDER — MORPHINE SULFATE 2 MG/ML IJ SOLN
2.0000 mg | Freq: Once | INTRAMUSCULAR | Status: AC
  Administered 2012-07-21: 2 mg via INTRAVENOUS
  Filled 2012-07-21: qty 1

## 2012-07-21 NOTE — Progress Notes (Signed)
ANTICOAGULATION CONSULT NOTE - Initial Consult  Pharmacy Consult for Heparin / Coumadin Indication: chest pain/ACS/ hx of Afib  Allergies  Allergen Reactions  . Vesicare (Solifenacin)     Makes my sweat turn yellow    Labs:  Basename 07/21/12 2100 07/21/12 1323 07/21/12 1318 07/21/12 0500 07/21/12 0100 07/20/12 2348 07/20/12 1151 07/20/12 0410  HGB -- -- -- 10.7* -- -- 12.0 --  HCT -- -- -- 33.5* -- -- 37.1 37.7  PLT -- -- -- 266 -- -- 295 284  APTT -- -- -- -- -- -- -- 29  LABPROT -- -- -- 15.1 -- -- -- 16.4*  INR -- -- -- 1.17 -- -- -- 1.30  HEPARINUNFRC 0.42 <0.10* -- <0.10* -- -- -- --  CREATININE -- -- -- -- 0.95 -- 0.85 0.81  CKTOTAL -- -- 206* 149 -- 129 -- --  CKMB -- -- 7.1* 6.2* -- 6.3* -- --  TROPONINI -- -- 0.32* <0.30 -- <0.30 -- --    Estimated Creatinine Clearance: 92.2 ml/min (by C-G formula based on Cr of 0.95).  Assessment: 46 YO Female with CP/SOB/CHF on heparin. Heparin level now in goal range at 0.42.  Goal of Therapy:  Heparin level 0.3-0.7 units/ml Monitor platelets by anticoagulation protocol: Yes   Plan:  1) continue heparin at 1800 units/hr and recheck heparin level in am.  07/21/2012,9:35 PM

## 2012-07-21 NOTE — Progress Notes (Signed)
Clinical Child psychotherapist (CSW) informed that pt requesting a CSW consult. CSW visited pt room to assess however pt was not in room as she was having tests done.  CSW will return at a later time and compete a full assessment.  Theresia Bough, MSW, Theresia Majors [phone]

## 2012-07-21 NOTE — Progress Notes (Signed)
Medical Student Daily Progress Note  Subjective: Today Kim [name] is feeling much better. She is less SOB and her chest pain is resolving well. She complains of headache. Her appetite is improving. Apparently, she met a man online and stayed at his house for 2 weeks without medications; this is what brought her from [place] to [place]. Objective: Vital signs in last 24 hours: Filed Vitals:   07/21/12 1041 07/21/12 1300 07/21/12 1331 07/21/12 1523  BP: 123/77 113/69 113/69   Pulse: 83 88    Temp:  98.1 F (36.7 C)  98.5 F (36.9 C)  TempSrc:  Oral  Oral  Resp:  22    Height:      Weight:      SpO2:  96%     Weight change: 4.965 kg (10 lb 15.2 oz)  Intake/Output Summary (Last 24 hours) at 07/21/12 1605 Last data filed at 07/21/12 1524  Gross per 24 hour  Intake 1033.06 ml  Output   2050 ml  Net -1016.94 ml   Physical Exam: Vitals reviewed. General: resting in bed, NAD. More alert than yesterday. HEENT: PERRL, EOMI, no scleral icterus Cardiac: RRR with occasional ectopic beats, no rubs, murmurs or gallops Pulm: clear to auscultation bilaterally, no wheezes, rales, or rhonchi Abd: soft, moderate tenderness, nondistended, BS present Ext: warm and well perfused, 1+ pedal edema R>L Neuro: alert and oriented X3, cranial nerves II-XII grossly intact, strength and sensation to light touch equal in bilateral upper and lower extremities. Tenderness has decreased from yesterdays exam.  Lab Results: Basic Metabolic Panel:  Lab 07/21/12 1610 07/21/12 0100 07/20/12 1151 07/20/12 0410  NA -- 139 -- 139  K -- 3.0* -- 3.9  CL -- 94* -- 100  CO2 -- 29 -- 27  GLUCOSE -- 106* -- 135*  BUN -- 10 -- 9  CREATININE -- 0.95 0.85 --  CALCIUM -- 8.7 -- 9.2  MG 1.1* -- -- --  PHOS -- -- -- --   Liver Function Tests:  Lab 07/20/12 0410 07/15/12 2056  AST 36 32  ALT 24 25  ALKPHOS 53 56  BILITOT 1.3* 1.5*  PROT 7.3 7.1  ALBUMIN 3.7 3.7   CBC:  Lab 07/21/12 0500  07/20/12 1151 07/15/12 2056  WBC 8.3 9.8 --  NEUTROABS -- -- 8.5*  HGB 10.7* 12.0 --  HCT 33.5* 37.1 --  MCV 96.0 95.6 --  PLT 266 295 --   Cardiac Enzymes:  Lab 07/21/12 1318 07/21/12 0500 07/20/12 2348  CKTOTAL 206* 149 129  CKMB 7.1* 6.2* 6.3*  CKMBINDEX -- -- --  TROPONINI 0.32* <0.30 <0.30   CBG:  Lab 07/21/12 1514 07/21/12 1259 07/21/12 0746 07/21/12 0443 07/21/12 0031 07/20/12 2029  GLUCAP 91 101* 77 90 109* 153*   Hemoglobin A1C:  Lab 07/20/12 1151  HGBA1C 6.0*   Fasting Lipid Panel:  Lab 07/20/12 1143  CHOL 115  HDL 23*  LDLCALC 71  TRIG 960  CHOLHDL 5.0  LDLDIRECT --   Thyroid Function Tests:  Lab 07/20/12 1017  TSH 6.949*  T4TOTAL --  FREET4 --  T3FREE --  THYROIDAB --   Coagulation:  Lab 07/21/12 0500 07/20/12 0410  LABPROT 15.1 16.4*  INR 1.17 1.30    Micro Results: Recent Results (from the past 240 hour(s))  MRSA PCR SCREENING     Status: Normal   Collection Time   07/20/12 12:24 PM      Component Value Range Status Comment   MRSA by PCR NEGATIVE  NEGATIVE Final    Studies/Results: Dg Chest Port 1 View  07/20/2012  *RADIOLOGY REPORT*  Clinical Data: Respiratory distress.  Chest pain.  Short of breath.  PORTABLE CHEST - 1 VIEW  Comparison: 07/15/2012.  Findings: Low volume chest.  Pulmonary vascular congestion and basilar pulmonary edema.  Cardiomegaly.  Dual lead left subclavian cardiac pacemaker.  Lower lung volumes than on prior exam. Compared to prior exam, pulmonary aeration has worsened.  IMPRESSION: Low volume chest with mild to moderate CHF.  Original Report Authenticated By: Andreas Newport, M.D.   Medications: I have reviewed the patient's current medications. Scheduled Meds:   . aspirin EC  81 mg Oral Daily  . atorvastatin  20 mg Oral q1800  . budesonide-formoterol  2 puff Inhalation BID  . calcium carbonate  1,250 mg Oral Daily  . clobetasol cream   Topical BID  . ferrous sulfate  650 mg Oral Q1200  . furosemide  80 mg  Oral Daily  . heparin  2,000 Units Intravenous Once  . heparin  3,000 Units Intravenous Once  . heparin  4,000 Units Intravenous Once  . insulin aspart  0-9 Units Subcutaneous Q4H  . ketorolac  10 mg Oral Q8H  . levothyroxine  25 mcg Oral QAC breakfast  . lisinopril  5 mg Oral Daily  . magnesium sulfate 1 - 4 g bolus IVPB  2 g Intravenous Once  . nicotine  14 mg Transdermal Daily  . pantoprazole  40 mg Oral Daily  . potassium chloride  40 mEq Oral Once  . regadenoson  0.4 mg Intravenous Once  . tiotropium  18 mcg Inhalation Daily  . warfarin  7.5 mg Oral ONCE-1800  . Warfarin - Pharmacist Dosing Inpatient   Does not apply q1800  . DISCONTD: levothyroxine  25 mcg Oral QAC breakfast  . DISCONTD: magnesium sulfate  2 g Intravenous Once  . DISCONTD: warfarin  5 mg Oral q1800  . DISCONTD: Warfarin - Physician Dosing Inpatient   Does not apply q1800   Continuous Infusions:   . heparin 1,800 Units/hr (07/21/12 1437)  . DISCONTD: nitroGLYCERIN 12 mcg/min (07/21/12 0900)   PRN Meds:.acetaminophen, albuterol, hydrOXYzine, metoCLOPramide (REGLAN) injection, ondansetron (ZOFRAN) IV, ondansetron, oxyCODONE, DISCONTD: metoCLOPramide Assessment/Plan: Active Problems:  CHF exacerbation: Patient presented with shortness of breath likely 2/2 not having access to medications for the past two weeks. Increased IS markings on CXR were c/w pulmonary edema. SOB has improved with lasix administration. - Continue PO Lasix 80 mg BID - Conitnue monitoring on SDU, may transfer to medicine or d/c in the morning if necessary. - Strict Is/Os, daily weights. Her weight in the ED (232) was not c/w her previous weights or weight today (244.5 lbs)  Chest pain: Improved significantly today, still has tenderness to palpation (indicating an MSK process). Her CK-MB has been positive throughout her stay, and her troponins have jumped to 0.38 at the highest, with a recent spike of 0.32. Despite this, the patient has not had  typical anginal symptoms. - Awaiting results of lexiscan - Continue monitoring on telemetry - continue lisinopril and atorvastatin - d/c nitro gtt per cards. Nitro has not improved her symptoms during this hospitalization. - ASA 81 mg, continue toradol   Atrial fibrillation: Pts EKG showed Afib and flutter. Exam today is much improved from yesterday, RRR with ectopic beats rather than an irregularly irregular rhythm. There is still some question as to why her pacer is set at 100 bpm, explained as an "override" per the cards note.  She did not receive her warfarin yesterday for no specific reason that I can gather. INR today is 1.17, subtherapeutic.  - Continue heparin drip with warfarin bridge therapy. Per pharmacy, dosing is at 7.5 mg QDay for now. - AM INR - AM EKG - f/u with cards regarding pacer interrogation.   COPD (chronic obstructive pulmonary disease): Patient may not need oxygen; during the 20 minutes of her interview today her sats did not drop below 96%. Lung exam was clear to auscultation. She also smokes 1/2 PPD, and expressed a desire to quit during our interview. - Continue home inhaled meds and nebs PRN - PT/OT consult for home O2 needs    Skin lesion: on extremities and scalp x 3 years in duration. 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 Her home meds listed clotrimazole/betamethasone.  -Contnue Temovate 0.05% cream, apply to affected area - hydroxyzine and benadryl PRN itching - Derm c/s   Hypokalemia/Hypomagnesemia: Mg 1.1 and K 3.0 - Patient placed on - Repeat AM BMet  Hypothyroidism: Patient has a history of hypothyroidism treated with levothyroxine. Most recent TSH was 7. - Synthroid 25 mcg PO QDay - Consider checking TSH level tomorrow  Headache/Chronic lower back pain: Patient states that she has oxycodon that she takes intermittently while away from the hospital. She states that she was able  to go 14 days without this medication because of the infrequency with which she takes it. She says she recently spoke with GI docs who told her she can't take Tylenol due to her HCV. - Increase oxycodone 10 mg 12H PRN from QDay to BID - toradol TID  Nausea/Vomiting: Patient was vomiting on hospital day 1. No episodes today. - Continue reglan, protonix scheduled, zofran PRN  History of Diabetes: A1C was a reassuring 6.0%. - Continue glc checks with SSI per protocol  Dispo: Awaiting results from lexiscan and cardiology's follow-up recommendations regarding the best care for Ms. Kim [name]. She also needs assistance getting to [facility] where she can stay with her close friend Kim [name] (where she stayed before coming to [facility]). - appreciate PT/OT recs - Cards Recs - establish care with [facility] cardiology clinic in [place]   LOS: 1 day   This is a Psychologist, occupational Note.  The care of the patient was discussed with Dr. Shirlee Latch in the assessment and plan formulated with their assistance.  Please see their attached note for official documentation of the daily encounter.  Luretha Rued 07/21/2012, 4:05 PM

## 2012-07-21 NOTE — Progress Notes (Signed)
ANTICOAGULATION CONSULT NOTE - Initial Consult  Pharmacy Consult for Heparin / Coumadin Indication: chest pain/ACS/ hx of Afib  Allergies  Allergen Reactions  . Vesicare (Solifenacin)     Makes my sweat turn yellow    Labs:  Basename 07/21/12 1323 07/21/12 0500 07/21/12 0100 07/20/12 2348 07/20/12 1856 07/20/12 1151 07/20/12 0410  HGB -- 10.7* -- -- -- 12.0 --  HCT -- 33.5* -- -- -- 37.1 37.7  PLT -- 266 -- -- -- 295 284  APTT -- -- -- -- -- -- 29  LABPROT -- 15.1 -- -- -- -- 16.4*  INR -- 1.17 -- -- -- -- 1.30  HEPARINUNFRC <0.10* <0.10* -- -- -- -- --  CREATININE -- -- 0.95 -- -- 0.85 0.81  CKTOTAL -- 149 -- 129 100 -- --  CKMB -- 6.2* -- 6.3* 6.4* -- --  TROPONINI -- <0.30 -- <0.30 0.36* -- --    Estimated Creatinine Clearance: 92.2 ml/min (by C-G formula based on Cr of 0.95).  Assessment: 46 YO Female with possible ACS for Heparin  Heparin level still sub-therapeutic  To restart Coumadin today  Goal of Therapy:  Heparin level 0.3-0.7 units/ml Monitor platelets by anticoagulation protocol: Yes   Plan:  1) Heparin 3000 units IV bolus, then increase heparin 1800 units/hr 2) Check heparin level in 6 hours. 3) Coumadin 7.5 mg po x 1 dose today 4) Follow up AM labs  Thank you. Okey Regal, PharmD [phone]   07/21/2012,2:26 PM

## 2012-07-21 NOTE — Progress Notes (Signed)
Resident Co-sign Daily Note: I have seen the patient and reviewed the daily progress note by  MS-4, Kim [name], and discussed the care of the patient with them.  See below for documentation of my findings, assessment, and plans.  (Saw patient about 5:30 PM. Tried to see earlier but she was in [place]) Subjective: Pt denies sob, pt denies chest pain. Pt says she has frontal h/a 10/10 that started d/t stress (i.e living situation, she and son not speaking).  She states if she needs home PT/OT she cant return to the place where she was living previously she will have to live w/ a friend.  She has one son and they are not talking she states d/t his girlfriend currently. Otherwise, her support system is this one friend who she may have to live with now. Pt c/o ab pain noticed when she is stressed as well.    Objective: Vital signs in last 24 hours: Filed Vitals:   07/21/12 1041 07/21/12 1300 07/21/12 1331 07/21/12 1523  BP: 123/77 113/69 113/69 124/82  Pulse: 83 88  88  Temp:  98.1 F (36.7 C)  98.5 F (36.9 C)  TempSrc:  Oral  Oral  Resp:  22    Height:      Weight:      SpO2:  96%     Physical Exam: Vitals reviewed. General: resting in bed, NAD HEENT: min. scleral icterus b/l, shaved head Cardiac: ireg rhythm, reg rate, distant heart sounds Pulm: clear to auscultation bilaterally, no wheezes, rales, or rhonchi Abd: soft, min ttp ruq, nondistended, BS present (normal), obese abdomen Ext: warm and well perfused, no pedal edema Neuro: alert and oriented X3 Skin: scalp with excoriated plaques x 3  Lab Results: Results for Kim, [name] (MRN [id number]) as of 07/21/2012 19:56  Ref. Range 07/21/2012 01:00 07/21/2012 05:00 07/21/2012 13:18 07/21/2012 13:23  Sodium Latest Range: 135-145 mEq/L 139     Potassium Latest Range: 3.5-5.1 mEq/L 3.0 (L)     Chloride Latest Range: 96-112 mEq/L 94 (L)     CO2 Latest Range: 19-32 mEq/L 29     BUN Latest Range: 6-23 mg/dL 10     Creatinine Latest  Range: 0.50-1.10 mg/dL 1.47     Calcium Latest Range: 8.4-10.5 mg/dL 8.7     GFR calc non Af Amer Latest Range: >90 mL/min 70 (L)     GFR calc Af Amer Latest Range: >90 mL/min 81 (L)     Glucose Latest Range: 70-99 mg/dL 829 (H)     Magnesium Latest Range: 1.5-2.5 mg/dL  1.1 (L)    CK, MB Latest Range: 0.3-4.0 ng/mL  6.2 (HH) 7.1 (HH)   CK Total Latest Range: 7-177 U/L  149 206 (H)   Troponin I Latest Range: <0.30 ng/mL  <0.30 0.32 (HH)   WBC Latest Range: 4.0-10.5 K/uL  8.3    RBC Latest Range: 3.87-5.11 MIL/uL  3.49 (L)    Hemoglobin Latest Range: 12.0-15.0 g/dL  56.2 (L)    HCT Latest Range: 36.0-46.0 %  33.5 (L)    MCV Latest Range: 78.0-100.0 fL  96.0    MCH Latest Range: 26.0-34.0 pg  30.7    MCHC Latest Range: 30.0-36.0 g/dL  13.0    RDW Latest Range: 11.5-15.5 %  19.2 (H)    Platelets Latest Range: 150-400 K/uL  266    Heparin Unfractionated Latest Range: 0.30-0.70 IU/mL  <0.10 (L)  <0.10 (L)  Prothrombin Time Latest Range: 11.6-15.2 seconds  15.1  INR Latest Range: 0.00-1.49   1.17     Micro Results:none Studies/Results: 07/20/12 CXR 1 view Findings: Low volume chest. Pulmonary vascular congestion and  basilar pulmonary edema. Cardiomegaly. Dual lead left subclavian  cardiac pacemaker. Lower lung volumes than on prior exam. Compared  to prior exam, pulmonary aeration has worsened.  IMPRESSION:  Low volume chest with mild to moderate CHF.  Medications: medication reviewed Scheduled Meds:    . aspirin EC  81 mg Oral Daily  . atorvastatin  20 mg Oral q1800  . budesonide-formoterol  2 puff Inhalation BID  . calcium carbonate  1,250 mg Oral Daily  . clobetasol cream   Topical BID  . ferrous sulfate  650 mg Oral Q1200  . furosemide  80 mg Oral Daily  . heparin  2,000 Units Intravenous Once  . heparin  3,000 Units Intravenous Once  . heparin  4,000 Units Intravenous Once  . insulin aspart  0-9 Units Subcutaneous Q4H  . ketorolac  10 mg Oral Q8H  . levothyroxine  25  mcg Oral QAC breakfast  . lisinopril  5 mg Oral Daily  . magnesium sulfate 1 - 4 g bolus IVPB  2 g Intravenous Once  .  morphine injection  2 mg Intravenous Once  . nicotine  14 mg Transdermal Daily  . pantoprazole  40 mg Oral Daily  . potassium chloride  40 mEq Oral Once  . regadenoson  0.4 mg Intravenous Once  . tiotropium  18 mcg Inhalation Daily  . warfarin  7.5 mg Oral ONCE-1800  . Warfarin - Pharmacist Dosing Inpatient   Does not apply q1800  . DISCONTD: levothyroxine  25 mcg Oral QAC breakfast  . DISCONTD: magnesium sulfate  2 g Intravenous Once  . DISCONTD: warfarin  5 mg Oral q1800  . DISCONTD: Warfarin - Physician Dosing Inpatient   Does not apply q1800   Continuous Infusions:    . heparin 1,800 Units/hr (07/21/12 1700)  . DISCONTD: nitroGLYCERIN 12 mcg/min (07/21/12 0900)   PRN Meds:.acetaminophen, albuterol, hydrOXYzine, metoCLOPramide (REGLAN) injection, ondansetron (ZOFRAN) IV, ondansetron, oxyCODONE Assessment/Plan:  Assessment & Plan by Problem:  46 y.o woman PMH COPD, chronic CHF, thyroid disease, hep c virus, MI, atrial fibrillation, DM, GERD, chronic h/a, anxiety, chronic pain, s/p pacemaker. Patient presents with sob likely 2/2 chf exacerbation d/t noncompliance and chest pain.   1. SOB:  -Improved - likely acute on chronic diastolic CHF exacerbation 2/2 medication non-compliance w/o meds since beginning of August.  -transitioned iv Lasix to PO Lasix 80mg  daily -continue Strict I&O's, daily weight  2. Chest pain:  -improved -likely musculoskeletal in origin with tenderness on palpation of the chest wall and it is worsen with movement which could be costochondritis vs related to anxiety vs NSTEMI -Given her risk factors and family history of premature CAD, will need to rule out ACS.  -She had a cardiac cath in 12/2008 which showed nonobstructive coronary disease.  -UDS positive for opiates.   -Lipid panel: LDL 71.  HbA1c: 6.0  -d/c nitro gtt, still on hep  gtt -Cards consulted -pt had Lexiscan today; she will have another part of the test tomorrow -cardiac enzymes trended with 0.36-->wnl then another spike 0.32. -will continue to trend enzympes   -Continue Lisinopril 5 mg qd and Lipitor 20 mg qhs -ASA 81mg  qd/Protonix  -Continue scheduled NSAIDs: Toradol 10mg  q8hr -EKG hard to interpret d/t setting of pacemaker rate (appears to be >70) and firing of pacer  3. COPD: -chronic on home oxygen at  2-3L -Will continue home inhalers: albuterol, symbicort and spiriva.  -Patient continues to smoke, counseled smoking cessation.   4. 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 Has not been treated. Last VL was in 12/2008: 3,570,000. LFTs are wnl.   5. DM II: - Glu wnl this admission -metformin 500 BID at home. HbA1c: 6.0, well controlled -Urine microalbumin/Cr:19.7. -Hold Metformin in setting of acute HF  -will continue to monitor fsbs, SSI prn  6. Atrial fibrillation also s/p pacemaker (Biotronik) on 03/02/12.  -CHADS score 3 -Dr. Mayme Genta and Roma Kayser -initial EKG shows afib/flutter and V paced complexes.  -Sledge with Warfarin, INR today is 1.17, subtherapeutic.  -Warfarin per pharmacy, INR  -Monitor rate -Consider adding BB or CCB, will get medical records from Dr. Mayme Genta   7. Hx Hypothyroidism -TSH: 6.49, pending free T4 -Cont Synthyoid 25 mcg qam  8. GERD/PUD:  -endoscopy in 12/2008 by Dr. Arlyce Dice showed multiple erosions in prepyloric antrum.  -Continue Protonix 40mg  qd   9. Chronic back pain:  -continue home Oxycodone 10mg  qdaily prn   10. Skin Lesions:  -on extremities and scalp x 3 years in duration. 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 Her home med listed clotrimazole/betamethasone.  -continue Temovate 0.05% cream, apply to affected area  -Consider starting Calcipotriene as outpatient in addition to topical steroids  -Will need to  follow up with a dermatologist   11. HTN:  -not well controlled. No home BP meds  -Rx lisinopril 5mg  qd, may need to titrate up  -change to PO Lasix  80 mg qd   12. F/E/N -K:3. Hypokalemia likely 2/2 hypomagnesemia (1.1 low) and or diuretics; Rx PO KCL 23mEqx1 and replete Mg IV 2g  -Repeat BMP and Mg today   13. DVT/GI px:  -Warfarin for atrial fib -protonix  14. Dispo -social work and case management consulted for home needs when stable -PT/OT   LOS: 1 day   Annett Gula 07/21/2012, 7:54 PM

## 2012-07-21 NOTE — Progress Notes (Signed)
Subjective:  Patient still with CP  Constant  Increase with deep breath. Objective: Filed Vitals:   07/21/12 0445 07/21/12 0500 07/21/12 0737 07/21/12 0759  BP: 114/73  119/73   Pulse: 88  81   Temp: 98.8 F (37.1 C)  97.5 F (36.4 C)   TempSrc: Oral  Oral   Resp: 23  23   Height:      Weight:  243 lb 9.7 oz (110.5 kg)    SpO2: 98%  93% 95%   Weight change: 10 lb 15.2 oz (4.965 kg)  Intake/Output Summary (Last 24 hours) at 07/21/12 0823 Last data filed at 07/21/12 0800  Gross per 24 hour  Intake 1119.46 ml  Output   2725 ml  Net -1605.54 ml    General: Alert, awake, oriented x3, in no acute distress Neck:  JVP is normal Heart: Regular rate and rhythm, without murmurs, rubs, gallops.  Chest:  Tender. Lungs: Clear to auscultation. Occasional rale. Exemities:  No edema.   Neuro: Grossly intact, nonfocal.  Tele:  SR  Ventricular paced. Lab Results: Results for orders placed during the hospital encounter of 07/20/12 (from the past 24 hour(s))  TSH     Status: Abnormal   Collection Time   07/20/12 10:17 AM      Component Value Range   TSH 6.949 (*) 0.350 - 4.500 uIU/mL  GLUCOSE, CAPILLARY     Status: Abnormal   Collection Time   07/20/12 11:33 AM      Component Value Range   Glucose-Capillary 103 (*) 70 - 99 mg/dL   Comment 1 Notify RN    CARDIAC PANEL(CRET KIN+CKTOT+MB+TROPI)     Status: Abnormal   Collection Time   07/20/12 11:43 AM      Component Value Range   Total CK 70  7 - 177 U/L   CK, MB 6.7 (*) 0.3 - 4.0 ng/mL   Troponin I <0.30  <0.30 ng/mL   Relative Index RELATIVE INDEX IS INVALID  0.0 - 2.5  LIPID PANEL     Status: Abnormal   Collection Time   07/20/12 11:43 AM      Component Value Range   Cholesterol 115  0 - 200 mg/dL   Triglycerides 105  <150 mg/dL   HDL 23 (*) >39 mg/dL   Total CHOL/HDL Ratio 5.0     VLDL 21  0 - 40 mg/dL   LDL Cholesterol 71  0 - 99 mg/dL  CBC     Status: Abnormal   Collection Time   07/20/12 11:51 AM      Component Value  Range   WBC 9.8  4.0 - 10.5 K/uL   RBC 3.88  3.87 - 5.11 MIL/uL   Hemoglobin 12.0  12.0 - 15.0 g/dL   HCT 37.1  36.0 - 46.0 %   MCV 95.6  78.0 - 100.0 fL   MCH 30.9  26.0 - 34.0 pg   MCHC 32.3  30.0 - 36.0 g/dL   RDW 18.7 (*) 11.5 - 15.5 %   Platelets 295  150 - 400 K/uL  CREATININE, SERUM     Status: Abnormal   Collection Time   07/20/12 11:51 AM      Component Value Range   Creatinine, Ser 0.85  0.50 - 1.10 mg/dL   GFR calc non Af Amer 80 (*) >90 mL/min   GFR calc Af Amer >90  >90 mL/min  HEMOGLOBIN A1C     Status: Abnormal   Collection Time   07/20/12 11:51   AM      Component Value Range   Hemoglobin A1C 6.0 (*) <5.7 %   Mean Plasma Glucose 126 (*) <117 mg/dL  MRSA PCR SCREENING     Status: Normal   Collection Time   07/20/12 12:24 PM      Component Value Range   MRSA by PCR NEGATIVE  NEGATIVE  MICROALBUMIN / CREATININE URINE RATIO     Status: Abnormal   Collection Time   07/20/12  2:02 PM      Component Value Range   Microalb, Ur 1.98 (*) 0.00 - 1.89 mg/dL   Creatinine, Urine 100.7     Microalb Creat Ratio 19.7  0.0 - 30.0 mg/g  URINE RAPID DRUG SCREEN (HOSP PERFORMED)     Status: Abnormal   Collection Time   07/20/12  2:02 PM      Component Value Range   Opiates POSITIVE (*) NONE DETECTED   Cocaine NONE DETECTED  NONE DETECTED   Benzodiazepines NONE DETECTED  NONE DETECTED   Amphetamines NONE DETECTED  NONE DETECTED   Tetrahydrocannabinol NONE DETECTED  NONE DETECTED   Barbiturates NONE DETECTED  NONE DETECTED  GLUCOSE, CAPILLARY     Status: Abnormal   Collection Time   07/20/12  5:15 PM      Component Value Range   Glucose-Capillary 103 (*) 70 - 99 mg/dL   Comment 1 Notify RN    CARDIAC PANEL(CRET KIN+CKTOT+MB+TROPI)     Status: Abnormal   Collection Time   07/20/12  6:56 PM      Component Value Range   Total CK 100  7 - 177 U/L   CK, MB 6.4 (*) 0.3 - 4.0 ng/mL   Troponin I 0.36 (*) <0.30 ng/mL   Relative Index 6.4 (*) 0.0 - 2.5  GLUCOSE, CAPILLARY     Status:  Abnormal   Collection Time   07/20/12  8:29 PM      Component Value Range   Glucose-Capillary 153 (*) 70 - 99 mg/dL  CARDIAC PANEL(CRET KIN+CKTOT+MB+TROPI)     Status: Abnormal   Collection Time   07/20/12 11:48 PM      Component Value Range   Total CK 129  7 - 177 U/L   CK, MB 6.3 (*) 0.3 - 4.0 ng/mL   Troponin I <0.30  <0.30 ng/mL   Relative Index 4.9 (*) 0.0 - 2.5  GLUCOSE, CAPILLARY     Status: Abnormal   Collection Time   07/21/12 12:31 AM      Component Value Range   Glucose-Capillary 109 (*) 70 - 99 mg/dL  BASIC METABOLIC PANEL     Status: Abnormal   Collection Time   07/21/12  1:00 AM      Component Value Range   Sodium 139  135 - 145 mEq/L   Potassium 3.0 (*) 3.5 - 5.1 mEq/L   Chloride 94 (*) 96 - 112 mEq/L   CO2 29  19 - 32 mEq/L   Glucose, Bld 106 (*) 70 - 99 mg/dL   BUN 10  6 - 23 mg/dL   Creatinine, Ser 0.95  0.50 - 1.10 mg/dL   Calcium 8.7  8.4 - 10.5 mg/dL   GFR calc non Af Amer 70 (*) >90 mL/min   GFR calc Af Amer 81 (*) >90 mL/min  GLUCOSE, CAPILLARY     Status: Normal   Collection Time   07/21/12  4:43 AM      Component Value Range   Glucose-Capillary 90  70 - 99   mg/dL  PROTIME-INR     Status: Normal   Collection Time   07/21/12  5:00 AM      Component Value Range   Prothrombin Time 15.1  11.6 - 15.2 seconds   INR 1.17  0.00 - 1.49  CBC     Status: Abnormal   Collection Time   07/21/12  5:00 AM      Component Value Range   WBC 8.3  4.0 - 10.5 K/uL   RBC 3.49 (*) 3.87 - 5.11 MIL/uL   Hemoglobin 10.7 (*) 12.0 - 15.0 g/dL   HCT 33.5 (*) 36.0 - 46.0 %   MCV 96.0  78.0 - 100.0 fL   MCH 30.7  26.0 - 34.0 pg   MCHC 31.9  30.0 - 36.0 g/dL   RDW 19.2 (*) 11.5 - 15.5 %   Platelets 266  150 - 400 K/uL  HEPARIN LEVEL (UNFRACTIONATED)     Status: Abnormal   Collection Time   07/21/12  5:00 AM      Component Value Range   Heparin Unfractionated <0.10 (*) 0.30 - 0.70 IU/mL  CARDIAC PANEL(CRET KIN+CKTOT+MB+TROPI)     Status: Abnormal   Collection Time    07/21/12  5:00 AM      Component Value Range   Total CK 149  7 - 177 U/L   CK, MB 6.2 (*) 0.3 - 4.0 ng/mL   Troponin I <0.30  <0.30 ng/mL   Relative Index 4.2 (*) 0.0 - 2.5  MAGNESIUM     Status: Abnormal   Collection Time   07/21/12  5:00 AM      Component Value Range   Magnesium 1.1 (*) 1.5 - 2.5 mg/dL    Studies/Results: @RISRSLT24@  Medications: Reviewed.   Patient Active Hospital Problem List: CHF exacerbation (07/20/2012)   Assessment: Diastolic Acute on Chronic IMproved with IV lasix  WIll resume PO today. Chest pain (07/20/2012)   Assessment: Today atypical features.  Will taper NTG  Not convinced helping. Plan Lexiscan myoview. Atrial fibrillation (07/20/2012)   Assessment: Remains in SR.  Coronary atherosclerosis of native coronary artery (07/20/2012)   Assessment: Lesxiscan myoview today.    LOS: 1 day   Kim [name] 07/21/2012, 8:23 AM   

## 2012-07-21 NOTE — Progress Notes (Signed)
ANTICOAGULATION CONSULT NOTE - Initial Consult  Pharmacy Consult for Heparin Indication: chest pain/ACS/ hx of Afib  Allergies  Allergen Reactions  . Vesicare (Solifenacin)     Makes my sweat turn yellow    Patient Measurements: Height: 5\' 6"  (167.6 cm) Weight: 243 lb 9.7 oz (110.5 kg) IBW/kg (Calculated) : 59.3  Heparin Dosing Weight: 84.9 kg  Vital Signs: Temp: 97.4 F (36.3 C) (08/15 0029) Temp src: Oral (08/15 0029) BP: 114/73 mmHg (08/15 0445) Pulse Rate: 88  (08/15 0445)  Labs:  Basename 07/21/12 0500 07/21/12 0100 07/20/12 2348 07/20/12 1856 07/20/12 1151 07/20/12 1143 07/20/12 0410  HGB 10.7* -- -- -- 12.0 -- --  HCT 33.5* -- -- -- 37.1 -- 37.7  PLT 266 -- -- -- 295 -- 284  APTT -- -- -- -- -- -- 29  LABPROT 15.1 -- -- -- -- -- 16.4*  INR 1.17 -- -- -- -- -- 1.30  HEPARINUNFRC <0.10* -- -- -- -- -- --  CREATININE -- 0.95 -- -- 0.85 -- 0.81  CKTOTAL -- -- 129 100 -- 70 --  CKMB -- -- 6.3* 6.4* -- 6.7* --  TROPONINI -- -- <0.30 0.36* -- <0.30 --    Estimated Creatinine Clearance: 92.2 ml/min (by C-G formula based on Cr of 0.95).  Assessment: 46 YO Female with possible ACS for Heparin  Goal of Therapy:  Heparin level 0.3-0.7 units/ml Monitor platelets by anticoagulation protocol: Yes   Plan:  Heparin 2000 units IV bolus, then increase heparin 1500 units/hr Check heparin level in 6 hours.  Geannie Risen, PharmD, BCPS 07/21/2012,6:35 AM
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
## 2012-07-21 NOTE — Progress Notes (Signed)
07/21/2012 1:13 PM Pt returned from myoview scan. Vss. No complaints of pain. Will continue to monitor. Kim [name]

## 2012-07-22 ENCOUNTER — Inpatient Hospital Stay (HOSPITAL_COMMUNITY): Payer: Medicaid Other

## 2012-07-22 ENCOUNTER — Encounter (HOSPITAL_COMMUNITY)
Admission: EM | Disposition: A | Discharge status: Home / Self Care | Payer: Self-pay | Source: Home / Self Care | Attending: Internal Medicine

## 2012-07-22 DIAGNOSIS — I2510 Atherosclerotic heart disease of native coronary artery without angina pectoris: Secondary | ICD-10-CM

## 2012-07-22 HISTORY — PX: LEFT HEART CATHETERIZATION WITH CORONARY ANGIOGRAM: SHX5451

## 2012-07-22 LAB — CARDIAC PANEL(CRET KIN+CKTOT+MB+TROPI)
Relative Index: 1.8 (ref 0.0–2.5)
Relative Index: 1.9 (ref 0.0–2.5)
Total CK: 333 U/L — ABNORMAL HIGH (ref 7–177)
Troponin I: <0.3 ng/mL (ref <0.30)

## 2012-07-22 LAB — GLUCOSE, CAPILLARY
Glucose-Capillary: 101 mg/dL — ABNORMAL HIGH (ref 70–99)
Glucose-Capillary: 109 mg/dL — ABNORMAL HIGH (ref 70–99)
Glucose-Capillary: 120 mg/dL — ABNORMAL HIGH (ref 70–99)
Glucose-Capillary: 154 mg/dL — ABNORMAL HIGH (ref 70–99)

## 2012-07-22 LAB — CBC
HCT: 33.4 % — ABNORMAL LOW (ref 36.0–46.0)
Hemoglobin: 10.6 g/dL — ABNORMAL LOW (ref 12.0–15.0)
MCH: 30.5 pg (ref 26.0–34.0)
MCHC: 31.7 g/dL (ref 30.0–36.0)
MCV: 96 fL (ref 78.0–100.0)

## 2012-07-22 LAB — BASIC METABOLIC PANEL
BUN: 12 mg/dL (ref 6–23)
Calcium: 8.3 mg/dL — ABNORMAL LOW (ref 8.4–10.5)
Creatinine, Ser: 1.01 mg/dL (ref 0.50–1.10)
GFR calc non Af Amer: 65 mL/min — ABNORMAL LOW (ref >90)
Glucose, Bld: 110 mg/dL — ABNORMAL HIGH (ref 70–99)
Sodium: 136 mEq/L (ref 135–145)

## 2012-07-22 SURGERY — LEFT HEART CATHETERIZATION WITH CORONARY ANGIOGRAM
Anesthesia: LOCAL

## 2012-07-22 MED ORDER — FENTANYL CITRATE 0.05 MG/ML IJ SOLN
50.0000 ug | Freq: Once | INTRAMUSCULAR | Status: AC
  Administered 2012-07-22: 50 ug via INTRAVENOUS

## 2012-07-22 MED ORDER — INSULIN ASPART 100 UNIT/ML ~~LOC~~ SOLN
0.0000 [IU] | Freq: Three times a day (TID) | SUBCUTANEOUS | Status: DC
  Administered 2012-07-23: 1 [IU] via SUBCUTANEOUS

## 2012-07-22 MED ORDER — HEPARIN (PORCINE) IN NACL 100-0.45 UNIT/ML-% IJ SOLN
1850.0000 [IU]/h | INTRAMUSCULAR | Status: DC
  Administered 2012-07-22: 1850 [IU]/h via INTRAVENOUS
  Filled 2012-07-22 (×2): qty 250

## 2012-07-22 MED ORDER — HEPARIN SODIUM (PORCINE) 5000 UNIT/ML IJ SOLN
5000.0000 [IU] | Freq: Three times a day (TID) | INTRAMUSCULAR | Status: DC
  Filled 2012-07-22: qty 1

## 2012-07-22 MED ORDER — FENTANYL CITRATE 0.05 MG/ML IJ SOLN
INTRAMUSCULAR | Status: AC
  Filled 2012-07-22: qty 2

## 2012-07-22 MED ORDER — FUROSEMIDE 80 MG PO TABS
160.0000 mg | ORAL_TABLET | Freq: Every day | ORAL | Status: DC
  Administered 2012-07-22: 160 mg via ORAL
  Filled 2012-07-22 (×2): qty 2

## 2012-07-22 MED ORDER — ONDANSETRON HCL 4 MG/2ML IJ SOLN: 4.0000 mg | Freq: Four times a day (QID) | INTRAMUSCULAR | Status: DC | PRN

## 2012-07-22 MED ORDER — HEPARIN (PORCINE) IN NACL 2-0.9 UNIT/ML-% IJ SOLN
INTRAMUSCULAR | Status: AC
  Filled 2012-07-22: qty 2000

## 2012-07-22 MED ORDER — HEPARIN SODIUM (PORCINE) 1000 UNIT/ML IJ SOLN
INTRAMUSCULAR | Status: AC
  Filled 2012-07-22: qty 1

## 2012-07-22 MED ORDER — NITROGLYCERIN 0.2 MG/ML ON CALL CATH LAB
INTRAVENOUS | Status: AC
  Filled 2012-07-22: qty 1

## 2012-07-22 MED ORDER — VERAPAMIL HCL 2.5 MG/ML IV SOLN
INTRAVENOUS | Status: AC
  Filled 2012-07-22: qty 2

## 2012-07-22 MED ORDER — TECHNETIUM TC 99M TETROFOSMIN IV KIT
30.0000 | PACK | Freq: Once | INTRAVENOUS | Status: AC | PRN
  Administered 2012-07-22: 30 via INTRAVENOUS

## 2012-07-22 MED ORDER — ACETAMINOPHEN 325 MG PO TABS: 650.0000 mg | ORAL_TABLET | ORAL | Status: DC | PRN

## 2012-07-22 MED ORDER — SODIUM CHLORIDE 0.9 % IV SOLN
INTRAVENOUS | Status: AC
  Administered 2012-07-22: 20:00:00 via INTRAVENOUS

## 2012-07-22 MED ORDER — MIDAZOLAM HCL 2 MG/2ML IJ SOLN
INTRAMUSCULAR | Status: AC
  Filled 2012-07-22: qty 2

## 2012-07-22 MED ORDER — LIDOCAINE HCL (PF) 1 % IJ SOLN
INTRAMUSCULAR | Status: AC
  Filled 2012-07-22: qty 30

## 2012-07-22 MED ORDER — WARFARIN SODIUM 7.5 MG PO TABS
7.5000 mg | ORAL_TABLET | Freq: Once | ORAL | Status: DC
  Filled 2012-07-22: qty 1

## 2012-07-22 MED ORDER — MORPHINE SULFATE 2 MG/ML IJ SOLN
2.0000 mg | Freq: Once | INTRAMUSCULAR | Status: AC
  Administered 2012-07-22: 2 mg via INTRAVENOUS
  Filled 2012-07-22: qty 1

## 2012-07-22 NOTE — H&P (View-Only) (Signed)
Subjective:  Patient still with CP  Constant  Increase with deep breath. Objective: Filed Vitals:   07/21/12 0445 07/21/12 0500 07/21/12 0737 07/21/12 0759  BP: 114/73  119/73   Pulse: 88  81   Temp: 98.8 F (37.1 C)  97.5 F (36.4 C)   TempSrc: Oral  Oral   Resp: 23  23   Height:      Weight:  243 lb 9.7 oz (110.5 kg)    SpO2: 98%  93% 95%   Weight change: 10 lb 15.2 oz (4.965 kg)  Intake/Output Summary (Last 24 hours) at 07/21/12 0823 Last data filed at 07/21/12 0800  Gross per 24 hour  Intake 1119.46 ml  Output   2725 ml  Net -1605.54 ml    General: Alert, awake, oriented x3, in no acute distress Neck:  JVP is normal Heart: Regular rate and rhythm, without murmurs, rubs, gallops.  Chest:  Tender. Lungs: Clear to auscultation. Occasional rale. Exemities:  No edema.   Neuro: Grossly intact, nonfocal.  Tele:  SR  Ventricular paced. Lab Results: Results for orders placed during the hospital encounter of 07/20/12 (from the past 24 hour(s))  TSH     Status: Abnormal   Collection Time   07/20/12 10:17 AM      Component Value Range   TSH 6.949 (*) 0.350 - 4.500 uIU/mL  GLUCOSE, CAPILLARY     Status: Abnormal   Collection Time   07/20/12 11:33 AM      Component Value Range   Glucose-Capillary 103 (*) 70 - 99 mg/dL   Comment 1 Notify RN    CARDIAC PANEL(CRET KIN+CKTOT+MB+TROPI)     Status: Abnormal   Collection Time   07/20/12 11:43 AM      Component Value Range   Total CK 70  7 - 177 U/L   CK, MB 6.7 (*) 0.3 - 4.0 ng/mL   Troponin I <0.30  <0.30 ng/mL   Relative Index RELATIVE INDEX IS INVALID  0.0 - 2.5  LIPID PANEL     Status: Abnormal   Collection Time   07/20/12 11:43 AM      Component Value Range   Cholesterol 115  0 - 200 mg/dL   Triglycerides 161  <096 mg/dL   HDL 23 (*) >04 mg/dL   Total CHOL/HDL Ratio 5.0     VLDL 21  0 - 40 mg/dL   LDL Cholesterol 71  0 - 99 mg/dL  CBC     Status: Abnormal   Collection Time   07/20/12 11:51 AM      Component Value  Range   WBC 9.8  4.0 - 10.5 K/uL   RBC 3.88  3.87 - 5.11 MIL/uL   Hemoglobin 12.0  12.0 - 15.0 g/dL   HCT 54.0  98.1 - 19.1 %   MCV 95.6  78.0 - 100.0 fL   MCH 30.9  26.0 - 34.0 pg   MCHC 32.3  30.0 - 36.0 g/dL   RDW 47.8 (*) 29.5 - 62.1 %   Platelets 295  150 - 400 K/uL  CREATININE, SERUM     Status: Abnormal   Collection Time   07/20/12 11:51 AM      Component Value Range   Creatinine, Ser 0.85  0.50 - 1.10 mg/dL   GFR calc non Af Amer 80 (*) >90 mL/min   GFR calc Af Amer >90  >90 mL/min  HEMOGLOBIN A1C     Status: Abnormal   Collection Time   07/20/12 11:51  AM      Component Value Range   Hemoglobin A1C 6.0 (*) <5.7 %   Mean Plasma Glucose 126 (*) <117 mg/dL  MRSA PCR SCREENING     Status: Normal   Collection Time   07/20/12 12:24 PM      Component Value Range   MRSA by PCR NEGATIVE  NEGATIVE  MICROALBUMIN / CREATININE URINE RATIO     Status: Abnormal   Collection Time   07/20/12  2:02 PM      Component Value Range   Microalb, Ur 1.98 (*) 0.00 - 1.89 mg/dL   Creatinine, Urine 161.0     Microalb Creat Ratio 19.7  0.0 - 30.0 mg/g  URINE RAPID DRUG SCREEN (HOSP PERFORMED)     Status: Abnormal   Collection Time   07/20/12  2:02 PM      Component Value Range   Opiates POSITIVE (*) NONE DETECTED   Cocaine NONE DETECTED  NONE DETECTED   Benzodiazepines NONE DETECTED  NONE DETECTED   Amphetamines NONE DETECTED  NONE DETECTED   Tetrahydrocannabinol NONE DETECTED  NONE DETECTED   Barbiturates NONE DETECTED  NONE DETECTED  GLUCOSE, CAPILLARY     Status: Abnormal   Collection Time   07/20/12  5:15 PM      Component Value Range   Glucose-Capillary 103 (*) 70 - 99 mg/dL   Comment 1 Notify RN    CARDIAC PANEL(CRET KIN+CKTOT+MB+TROPI)     Status: Abnormal   Collection Time   07/20/12  6:56 PM      Component Value Range   Total CK 100  7 - 177 U/L   CK, MB 6.4 (*) 0.3 - 4.0 ng/mL   Troponin I 0.36 (*) <0.30 ng/mL   Relative Index 6.4 (*) 0.0 - 2.5  GLUCOSE, CAPILLARY     Status:  Abnormal   Collection Time   07/20/12  8:29 PM      Component Value Range   Glucose-Capillary 153 (*) 70 - 99 mg/dL  CARDIAC PANEL(CRET KIN+CKTOT+MB+TROPI)     Status: Abnormal   Collection Time   07/20/12 11:48 PM      Component Value Range   Total CK 129  7 - 177 U/L   CK, MB 6.3 (*) 0.3 - 4.0 ng/mL   Troponin I <0.30  <0.30 ng/mL   Relative Index 4.9 (*) 0.0 - 2.5  GLUCOSE, CAPILLARY     Status: Abnormal   Collection Time   07/21/12 12:31 AM      Component Value Range   Glucose-Capillary 109 (*) 70 - 99 mg/dL  BASIC METABOLIC PANEL     Status: Abnormal   Collection Time   07/21/12  1:00 AM      Component Value Range   Sodium 139  135 - 145 mEq/L   Potassium 3.0 (*) 3.5 - 5.1 mEq/L   Chloride 94 (*) 96 - 112 mEq/L   CO2 29  19 - 32 mEq/L   Glucose, Bld 106 (*) 70 - 99 mg/dL   BUN 10  6 - 23 mg/dL   Creatinine, Ser 9.60  0.50 - 1.10 mg/dL   Calcium 8.7  8.4 - 45.4 mg/dL   GFR calc non Af Amer 70 (*) >90 mL/min   GFR calc Af Amer 81 (*) >90 mL/min  GLUCOSE, CAPILLARY     Status: Normal   Collection Time   07/21/12  4:43 AM      Component Value Range   Glucose-Capillary 90  70 - 99  mg/dL  PROTIME-INR     Status: Normal   Collection Time   07/21/12  5:00 AM      Component Value Range   Prothrombin Time 15.1  11.6 - 15.2 seconds   INR 1.17  0.00 - 1.49  CBC     Status: Abnormal   Collection Time   07/21/12  5:00 AM      Component Value Range   WBC 8.3  4.0 - 10.5 K/uL   RBC 3.49 (*) 3.87 - 5.11 MIL/uL   Hemoglobin 10.7 (*) 12.0 - 15.0 g/dL   HCT 40.9 (*) 81.1 - 91.4 %   MCV 96.0  78.0 - 100.0 fL   MCH 30.7  26.0 - 34.0 pg   MCHC 31.9  30.0 - 36.0 g/dL   RDW 78.2 (*) 95.6 - 21.3 %   Platelets 266  150 - 400 K/uL  HEPARIN LEVEL (UNFRACTIONATED)     Status: Abnormal   Collection Time   07/21/12  5:00 AM      Component Value Range   Heparin Unfractionated <0.10 (*) 0.30 - 0.70 IU/mL  CARDIAC PANEL(CRET KIN+CKTOT+MB+TROPI)     Status: Abnormal   Collection Time    07/21/12  5:00 AM      Component Value Range   Total CK 149  7 - 177 U/L   CK, MB 6.2 (*) 0.3 - 4.0 ng/mL   Troponin I <0.30  <0.30 ng/mL   Relative Index 4.2 (*) 0.0 - 2.5  MAGNESIUM     Status: Abnormal   Collection Time   07/21/12  5:00 AM      Component Value Range   Magnesium 1.1 (*) 1.5 - 2.5 mg/dL    Studies/Results: @RISRSLT24 @  Medications: Reviewed.   Patient Active Hospital Problem List: CHF exacerbation (07/20/2012)   Assessment: Diastolic Acute on Chronic IMproved with IV lasix  WIll resume PO today. Chest pain (07/20/2012)   Assessment: Today atypical features.  Will taper NTG  Not convinced helping. Plan [employer] Atrial fibrillation (07/20/2012)   Assessment: Remains in SR.  Coronary atherosclerosis of native coronary artery (07/20/2012)   Assessment: Lesxiscan myoview today.    LOS: 1 day   Dietrich Pates 07/21/2012, 8:23 AM

## 2012-07-22 NOTE — Interval H&P Note (Signed)
History and Physical Interval Note:  07/22/2012 5:46 PM  Kim [name]  has presented today for surgery, with the diagnosis of cp and abnormal myoview  The various methods of treatment have been discussed with the patient and family. After consideration of risks, benefits and other options for treatment, the patient has consented to  Procedure(s) (LRB): LEFT HEART CATHETERIZATION WITH CORONARY ANGIOGRAM (N/A) as a surgical intervention .  The patient's history has been reviewed, patient examined, no change in status, stable for surgery.  I have reviewed the patient's chart and labs.  Questions were answered to the patient's satisfaction.     Daniel Bensimhon

## 2012-07-22 NOTE — Progress Notes (Signed)
CRITICAL VALUE ALERT  Critical value received: CKMB   Date of notification: [id number]   Time of notification:  1420  Critical value read back:yes  Nurse who received alert:  Broadus John  MD notified (1st page):  [place]  Time of first page:  1425  MD notified (2nd page):[place]  Time of second page:  Responding MD:  Meredith Pel  Time MD responded:1435
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
## 2012-07-22 NOTE — Progress Notes (Signed)
Clinical Social Work Department BRIEF PSYCHOSOCIAL ASSESSMENT 07/22/2012  Patient:  Kim [name], Kim [name]     Account Number:  [id number]     Admit date:  07/20/2012  Clinical Social Worker:  Lourdes Sledge  Date/Time:  07/22/2012 02:29 PM  Referred by:  Physician  Date Referred:  07/22/2012 Referred for  Psychosocial assessment   Other Referral:   Interview type:  Patient Other interview type:    PSYCHOSOCIAL DATA Living Status:  FRIEND(S) Admitted from facility:   Level of care:   Primary support name:  Illene Regulus [phone] Primary support relationship to patient:  FRIEND Degree of support available:   Pt states she will be moving with another friend. Pt does not appear to have significant support.    CURRENT CONCERNS Current Concerns  Other - See comment   Other Concerns:   Pt in need of transportation to a home in [place], [place]. Pt also requesting a pair of sandals.    SOCIAL WORK ASSESSMENT / PLAN CSW received a referral as Pt in need of transportation to a home in [place], [place]. Pt also requesting a pair of sandals.    CSW visited pt room and completed assessment. CSW explored pt living situation and whether a friend can provide transportation for her at dc. Pt stated she was living in [place] however is going to be moving to [address] [place], [place] [address] when medically stable due to issues with her current living situation. CSW explored whether pt can receive Medicaid transportation however when CSW contacted Medication transportation CSW was informed that pt Medicaid is still under [facility] and pt will need to have her information updated with [employer] she moves to. Pt aware and agreeable. Pt reports being very out of breath when walking and using home O2. CSW informed pt that it would not be safe for pt to transport by a taxi and that CSW unable to get a voucher for transportation to [employer]. Pt will receive transportation via EMS when  medically stable due to concerns with O2. CSW informed RN and CSW placed a PTAR form in pt shadow chart.   Assessment/plan status:  Psychosocial Support/Ongoing Assessment of Needs Other assessment/ plan:   Information/referral to community resources:   CSW provided pt with a shirt, socks and a pair of sandals as pt arrived to the hospital without shoes. CSW informed pt of need to go to DSS in the county resides in the get her information updated and in order to receive transportation services through [place]. Pt appreciative and denies having any other concerns. Pt to dc to a friends home.    PATIENT'S/FAMILY'S RESPONSE TO PLAN OF CARE: Pt sitting up in bed alert and oriented. Pt requested transportation to [employer] to a new residence where she will be residing in. Pt denies having any money to pay for transportaiton. Pt reports feeling out of breath when getting up and walking. Pt does not have any friends that can provide transportation or assistance. Pt will go by EMS to her friends home due to concerns with stability.        Theresia Bough, MSW, Theresia Majors [phone]

## 2012-07-22 NOTE — CV Procedure (Addendum)
Cardiac Cath Procedure Note:  Indication: CP and abnormal Myoview  Procedures performed:  1) Selective coronary angiography 2) Left heart catheterization 3) Left ventriculogram  Description of procedure:   The risks and indication of the procedure were explained. Consent was signed and placed on the chart. An appropriate timeout was taken prior to the procedure. After a normal Allen's test was confirmed, the right wrist was prepped and draped in the routine sterile fashion and anesthetized with 1% local lidocaine.   A 5 FR arterial sheath was then placed in the right radial artery using a modified Seldinger technique. Systemic heparin was administered. 3mg  IV verapamil was given through the sheath. Standard catheters including a JL 3.5, JR4 and straight pigtail were used. All catheter exchanges were made over a wire.  Complications:  None apparent  Findings:  Ao Pressure: 119/79 (96) LV Pressure:  114/13/23 There was no signficant gradient across the aortic valve on pullback.  Left main: Normal  LAD: Large vessel. There is a curly-Q in the mid-vessel which has associated calcification and about 40% stenosis. Mild plaque distally. 2 small diagonals In ostium of small D2 about 60-70% ostial stenosis.  LCX: Made up primarily of large OM-1 with 30-40% mid stenosis  RCA: Very large dominant vessel. 20% mid stenosis. PL1 with 40% proximal stenosis  LV-gram done in the RAO projection: Ejection fraction = Not well opacified, EF ~40% with apical dyskinesis  Assessment: 1. Mild non-obstructive CAD 2. Apparent mild-moderate LV dysfunction with apical dyskinesis   Plan/Discussion:  Based on coronary angiography CP does not appear ischemic in nature. She does appear to have a least a mild degree of LV dysfunction. Would get echo to further evaluate. Can be discharged home in am with outpatient f/u from our standpoint.   Arvilla Meres 6:12 PM   Patient d/w Dr. Tenny Craw. 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 Question has arose if this may have led to tachycardic induced cardiomyopathy though HR does not appear fast enough for this. [employer] rep paged and have requested that he turn pacer mode to VVI to avoid tracking AFL.   Truman Hayward 6:32 PM

## 2012-07-22 NOTE — Progress Notes (Signed)
ANTICOAGULATION CONSULT NOTE - Follow up Consult  Pharmacy Consult for Heparin / Coumadin Indication: chest pain/ACS/ hx of Afib  Allergies  Allergen Reactions  . Vesicare (Solifenacin)     Makes my sweat turn yellow    Labs:  Basename 07/22/12 0645 07/21/12 2100 07/21/12 2056 07/21/12 1323 07/21/12 1318 07/21/12 0500 07/21/12 0100 07/20/12 1151 07/20/12 0410  HGB 10.6* -- -- -- -- 10.7* -- -- --  HCT 33.4* -- -- -- -- 33.5* -- 37.1 --  PLT 265 -- -- -- -- 266 -- 295 --  APTT -- -- -- -- -- -- -- -- 29  LABPROT 15.0 -- -- -- -- 15.1 -- -- 16.4*  INR 1.16 -- -- -- -- 1.17 -- -- 1.30  HEPARINUNFRC 0.32 0.42 -- <0.10* -- -- -- -- --  CREATININE 1.01 0.99 -- -- -- -- 0.95 -- --  CKTOTAL PENDING -- 291* -- 206* -- -- -- --  CKMB 5.9* -- 6.5* -- 7.1* -- -- -- --  TROPONINI <0.30 -- <0.30 -- 0.32* -- -- -- --    Estimated Creatinine Clearance: 86.7 ml/min (by C-G formula based on Cr of 1.01).  Assessment: 46 yo female with CP/SOB/CHF on heparin. Heparin level remains in goal range at 0.32 but trending down towards lower limit of goal range. Heparin wt 85 kg.  No issues with line or bleeding per RN. Will bump up infusion rate a little to ensure heparin level remains therapeutic. INR subtherapeutic on admission, pt states she takes 5 mg of Coumadin daily. Coumadin now resumed. INR this AM of 1.16 is subtherapeutic after one dose (7.5 mg) Coumadin yesterday. CBC stable from yesterday.   Goal of Therapy:  Heparin level 0.3-0.7 units/ml Monitor platelets by anticoagulation protocol: Yes INR goal 2-3   Plan:  1) Increase heparin to 1850 units/hr and recheck heparin level in am. 2) Follow up CBC in AM and s/sx of bleeding 3) Coumadin 7.5 mg PO x1 4) Follow up INR in AM   Maudry Mayhew, PharmD Clinical Pharmacist Pgr [phone] 07/22/2012,8:25 AM

## 2012-07-22 NOTE — Progress Notes (Signed)
Medical Student Daily Progress Note  Subjective: Kim [name] got good rest without her oxygen overnight. She says there were times she felt short of breath but never needed to put the [place] back on. Chest pain has all but resolved. Her appetite is at baseline, her abdominal pain has resided. Her complaints today include a headache that has been bothering her throughout this hospitalization along with the lesions on her head "burning." She has been able to get up and use the bathroom without problems. Objective: Vital signs in last 24 hours: Filed Vitals:   07/22/12 0000 07/22/12 0023 07/22/12 0413 07/22/12 0738  BP:  115/62 105/76 128/80  Pulse:   91 89  Temp: 97.7 F (36.5 C)  97.7 F (36.5 C)   TempSrc: Oral  Oral   Resp:   22 24  Height:      Weight:      SpO2:   96% 95%   Weight change:   Intake/Output Summary (Last 24 hours) at 07/22/12 0753 Last data filed at 07/22/12 0700  Gross per 24 hour  Intake  563.1 ml  Output    900 ml  Net -336.9 ml   Physical Exam: Vitals reviewed. General: resting in bed, NAD HEENT: PERRL, EOMI, no scleral icterus. JVD could not be appreciated. Cardiac: RRR with some ectopic beats, no rubs, murmurs or gallops Pulm: clear to auscultation bilaterally, no wheezes, rales, or rhonchi Abd: soft, nontender, nondistended, BS present Ext: warm and well perfused, ankle remain tender to palpation, no pitting pedal edema could be appreciated on exam today. Her legs appear swollen, but this may be 2/2 her body habitus. Neuro: alert and oriented X3, cranial nerves II-XII grossly intact, strength and sensation to light touch equal in bilateral upper and lower extremities  Lab Results: Basic Metabolic Panel:  Lab 07/21/12 0981 07/21/12 2056 07/21/12 0500 07/21/12 0100  NA 137 -- -- 139  K 3.4* -- -- 3.0*  CL 97 -- -- 94*  CO2 30 -- -- 29  GLUCOSE 115* -- -- 106*  BUN 10 -- -- 10  CREATININE 0.99 -- -- 0.95  CALCIUM 8.1* -- -- 8.7  MG -- 1.8 1.1* --  PHOS  -- -- -- --   CBC:  Lab 07/22/12 0645 07/21/12 0500 07/15/12 2056  WBC 8.0 8.3 --  NEUTROABS -- -- 8.5*  HGB 10.6* 10.7* --  HCT 33.4* 33.5* --  MCV 96.0 96.0 --  PLT 265 266 --   Cardiac Enzymes:  Lab 07/22/12 0645 07/21/12 2056 07/21/12 1318  CKTOTAL PENDING 291* 206*  CKMB 5.9* 6.5* 7.1*  CKMBINDEX -- -- --  TROPONINI <0.30 <0.30 0.32*   CBG:  Lab 07/22/12 0411 07/22/12 0029 07/21/12 2038 07/21/12 1514 07/21/12 1259 07/21/12 0746  GLUCAP 107* 120* 120* 91 101* 77   Hemoglobin A1C:  Lab 07/20/12 1151  HGBA1C 6.0*   Fasting Lipid Panel:  Lab 07/20/12 1143  CHOL 115  HDL 23*  LDLCALC 71  TRIG 191  CHOLHDL 5.0  LDLDIRECT --   Thyroid Function Tests:  Lab 07/21/12 1328 07/20/12 1017  TSH -- 6.949*  T4TOTAL -- --  FREET4 1.38 --  T3FREE -- --  THYROIDAB -- --   Coagulation:  Lab 07/22/12 0645 07/21/12 0500 07/20/12 0410  LABPROT 15.0 15.1 16.4*  INR 1.16 1.17 1.30   Micro Results: Recent Results (from the past 240 hour(s))  MRSA PCR SCREENING     Status: Normal   Collection Time   07/20/12 12:24 PM  Component Value Range Status Comment   MRSA by PCR NEGATIVE  NEGATIVE Final    Medications: I have reviewed the patient's current medications. Scheduled Meds:   . aspirin EC  81 mg Oral Daily  . atorvastatin  20 mg Oral q1800  . budesonide-formoterol  2 puff Inhalation BID  . calcium carbonate  1,250 mg Oral Daily  . clobetasol cream   Topical BID  . ferrous sulfate  650 mg Oral Q1200  . furosemide  80 mg Oral Daily  . heparin  3,000 Units Intravenous Once  . insulin aspart  0-9 Units Subcutaneous Q4H  . ketorolac  10 mg Oral Q8H  . levothyroxine  25 mcg Oral QAC breakfast  . lisinopril  5 mg Oral Daily  . magnesium sulfate 1 - 4 g bolus IVPB  2 g Intravenous Once  .  morphine injection  2 mg Intravenous Once  . nicotine  14 mg Transdermal Daily  . pantoprazole  40 mg Oral Daily  . potassium chloride  40 mEq Oral Once  . regadenoson  0.4  mg Intravenous Once  . tiotropium  18 mcg Inhalation Daily  . warfarin  7.5 mg Oral ONCE-1800  . Warfarin - Pharmacist Dosing Inpatient   Does not apply q1800  . DISCONTD: levothyroxine  25 mcg Oral QAC breakfast  . DISCONTD: magnesium sulfate  2 g Intravenous Once  . DISCONTD: Warfarin - Physician Dosing Inpatient   Does not apply q1800   Continuous Infusions:   . heparin 1,800 Units/hr (07/22/12 0319)  . DISCONTD: nitroGLYCERIN 12 mcg/min (07/21/12 0900)   PRN Meds:.acetaminophen, albuterol, hydrOXYzine, metoCLOPramide (REGLAN) injection, ondansetron (ZOFRAN) IV, ondansetron, oxyCODONE Assessment/Plan: Principal Problem:  *Acute on chronic diastolic heart failure: Ms. Tessier's SOB has improved during this hospitalization. A total of 1.9 liters has been taken off as of 8/16, but only 300 ccs were taken off yesterday. She received 80 mg Lasix PO on 8/15, home medication is 160 mg /day. - Change Lasix dose to home dose at 160 mg PO QDay - Continue strict Is/Os and daily weights - If lexiscan has no abnormalities, transfer to medical bed or d/c to home if possible  Active Problems:  Atrial fibrillation: Pts EKG showed Afib and flutter on hospital day 1. Telemetry overnight showed a rate of approximately 90 (paced). Exam today similar to 8/15, RRR with ectopic beats rather than an irregularly irregular rhythm which was observed on 8/14. There is still some question as to why her pacer is set at 100 bpm. She received warfarin 7.5 mg yesterday. INR today is 1.17, subtherapeutic.  - Continue heparin drip with warfarin bridge therapy dosed per pharmacy's recs - AM INR  - f/u with cards regarding pacer interrogation. This may need to be done on an outpatient basis with her cardiologist.   Chest pain/ Coronary atherosclerosis of native coronary artery: Chest pain resolved, continuing to cycle cardiac enzymes since troponins will bump occasionally. CK continues trending upwards, CK-MB has been  abnormal during this entire hospitalization. - Appreciate results from lexiscan - Continue lisinopril 10 mg - Lipid panel was benign. Recommend continuing atorvastatin, home dose is 80 mg/day. - Continue current CE cycling  Hypokalemia: Received 40 mEq KCl on 8/15, K was 3.4 today. - Administer an additional 40 mEq today, goal K 4.0  Hypomagnesemia: resolved after receiving 2 g Mg sulfate   Skin lesions: Likely psoriasis, see silvery scale lesions on arms and legs as well as the 4 lesions on her scalp. Home meds  are clotrimazole/betamethazone -Contnue Temovate 0.05% cream, apply to affected area  - hydroxyzine and benadryl PRN itching  - Derm c/s  COPD (chronic obstructive pulmonary disease): Continue home inhaled medications for COPD.  History of Diabetes: A1C was a reassuring 6.0%.  - Continue glc checks with SSI per protocol  Dispo: The patient said she would like to stay an additional night because of concerns for her heart. If her [name] is benign, she can likely go home today from a clinical standpoint. The team will reassess the pts willingness to go home after her Lexiscan. - Patient said she needs a ride to [employer] to stay with her close friend [name], whom she has spent most of her time with recently. SW c/s to assess resources available to get her to [name]. - f/u appointment with [facility] (Dr. Mayme Genta) to interrogate her pacemaker. - PCP follow-up: [facility] - establish care at [facility] clinic for her convenience   LOS: 2 days   This is a Psychologist, occupational Note.  The care of the patient was discussed with Dr. Tonny Branch and the assessment and plan formulated with their assistance.  Please see their attached note for official documentation of the daily encounter.  Luretha Rued 07/22/2012, 7:53 AM

## 2012-07-22 NOTE — Progress Notes (Signed)
Internal Medicine Attending  Date: 07/22/2012  Patient name: Kim [name] Medical record number: [id number] Date of birth: 11-23-1966 Age: 46 y.o. Gender: female  I saw and evaluated the patient, and discussed her care with house staff.  We are awaiting the results of Myoview study which was done today and further cardiology recommendations.  Exam is notable for minimal expiratory wheezing; patient is on bronchodilators, and reports that she was smoking up until recently; I emphasized the importance of smoking cessation to her.  Outpatient management of her anticoagulation may be difficult, since she reports that she is moving to the [place] area and plans to establish with a new primary care physician there rather than returning to her prior PCP.  I asked if she could return to our clinic here for followup, but transportation is apparently a problem.  Her anticoagulation management would be simplified if she could be on Xarelto or Pradaxa which would not require INR monitoring rather than warfarin; I'm not sure if Medicaid will pay for these medications or whether they might be available through a patient assistance program.  Would try to obtain records from her prior hospitalizations in [facility] to see if either of these medications has been tried, and it may be helpful to contact her physician there.  Her ferrous sulfate dose should be divided twice a day rather than giving a single daily dose of 650 mg.

## 2012-07-22 NOTE — Progress Notes (Signed)
Resident Co-sign Daily Note: I have seen the patient and reviewed the daily progress note by Kim Ruddy MS IV and discussed the care of the patient with them.  See below for documentation of my findings, assessment, and plans.  Subjective: Slept well overnight.  Still short of breath today and her chest pain is better.  Appetite is good and she no longer has abdominal pain.    Objective: Vital signs in last 24 hours: Filed Vitals:   07/22/12 0023 07/22/12 0413 07/22/12 0738 07/22/12 1107  BP: 115/62 105/76 128/80   Pulse:  91 89   Temp:  97.7 F (36.5 C)  98.4 F (36.9 C)  TempSrc:  Oral  Oral  Resp:  22 24   Height:      Weight:      SpO2:  96% 95%    Physical Exam: Vitals reviewed. General: resting in bed, NAD HEENT: PERRL, EOMI, no scleral icterus Cardiac: occasional extra beats but otherwise RRR, no rubs, murmurs or gallops Pulm: clear to auscultation bilaterally, no wheezes, rales, or rhonchi Abd: soft, nontender, nondistended, BS present Ext: warm and well perfused, 1+ non-pitting edema Neuro: alert and oriented X3, cranial nerves II-XII grossly intact, strength and sensation to light touch equal in bilateral upper and lower extremities  Lab Results: Reviewed and documented in Electronic Record Micro Results: Reviewed and documented in Electronic Record Studies/Results: Reviewed and documented in Electronic Record Medications: I have reviewed the patient's current medications. Scheduled Meds:   . aspirin EC  81 mg Oral Daily  . atorvastatin  20 mg Oral q1800  . budesonide-formoterol  2 puff Inhalation BID  . calcium carbonate  1,250 mg Oral Daily  . clobetasol cream   Topical BID  . ferrous sulfate  650 mg Oral Q1200  . furosemide  160 mg Oral Daily  . heparin  3,000 Units Intravenous Once  . insulin aspart  0-9 Units Subcutaneous Q4H  . ketorolac  10 mg Oral Q8H  . levothyroxine  25 mcg Oral QAC breakfast  . lisinopril  5 mg Oral Daily  . magnesium sulfate  1 - 4 g bolus IVPB  2 g Intravenous Once  .  morphine injection  2 mg Intravenous Once  . nicotine  14 mg Transdermal Daily  . pantoprazole  40 mg Oral Daily  . tiotropium  18 mcg Inhalation Daily  . warfarin  7.5 mg Oral ONCE-1800  . warfarin  7.5 mg Oral ONCE-1800  . Warfarin - Pharmacist Dosing Inpatient   Does not apply q1800  . DISCONTD: furosemide  80 mg Oral Daily   Continuous Infusions:   . heparin 1,850 Units/hr (07/22/12 0857)  . DISCONTD: heparin 1,800 Units/hr (07/22/12 0800)   PRN Meds:.acetaminophen, albuterol, hydrOXYzine, metoCLOPramide (REGLAN) injection, ondansetron (ZOFRAN) IV, ondansetron, oxyCODONE, technetium tetrofosmin, technetium tetrofosmin  Assessment/Plan: 1. Acute on chronic diastolic heart failure: Kim [name]'s SOB has improved during this hospitalization.  She is down about 2.2L since admission.  Home medication is 160 mg /day.  Chest pain has resolved and her breathing is better.    - Change Lasix dose to home dose at 160 mg PO QDay   - Continue strict Is/Os and daily weights   - If lexiscan has no abnormalities, transfer to medical bed or d/c to home if possible   2. Atrial fibrillation: Pts EKG showed Afib and flutter on hospital day 1. Telemetry overnight showed a rate of approximately 90 (paced). Exam today similar to 8/15, RRR with ectopic beats rather  than an irregularly irregular rhythm which was observed on 8/14. There is still some question as to why her pacer is set at 100 bpm. She received warfarin 7.5 mg yesterday. INR today is 1.17, subtherapeutic.   - Continue heparin drip with warfarin bridge therapy dosed per pharmacy's recs   - AM INR   - f/u with cards regarding pacer interrogation. This may need to be done on an outpatient basis with  her cardiologist.   3. Chest pain/ Coronary atherosclerosis of native coronary artery: Chest pain resolved, continuing to cycle cardiac enzymes since troponins will bump occasionally. CK continues trending  upwards, CK-MB has been abnormal during this entire hospitalization.   - Appreciate results from lexiscan   - Continue lisinopril 10 mg   - Lipid panel was benign. Recommend continuing atorvastatin, home dose is 80 mg/day.   - Continue current CE cycling   4. Hypokalemia: Received 40 mEq KCl on 8/15, K was 3.4 today.   - Administer an additional 40 mEq today, goal K 4.0   5. Hypomagnesemia: resolved after receiving 2 g Mg sulfate   6. Skin lesions: Likely psoriasis, see silvery scale lesions on arms and legs as well as the 4 lesions on her scalp. Home meds are clotrimazole/betamethazone   -Contnue Temovate 0.05% cream, apply to affected area   - hydroxyzine and benadryl PRN itching   - Derm c/s   7. COPD (chronic obstructive pulmonary disease): Continue home inhaled medications for COPD.   8. History of Diabetes: A1C was a reassuring 6.0%.   - Continue glc checks with SSI per protocol   9. Dispo: The patient said she would like to stay an additional night because of concerns for her heart. If her Kim [name] is benign, she can likely go home tomorrow from a clinical standpoint. The team will reassess the pts willingness to go home after her Lexiscan.  - Patient said she needs a ride to [place] to stay with her close friend Kim [name], whom she has  spent most of her time with recently. SW c/s to assess resources available to get her to [name].   - f/u appointment with [facility] (Kim [name]) to interrogate her pacemaker.   - PCP follow-up: [facility]   - establish care at [facility] clinic for her convenience   LOS: 2 days   Kim [name] 07/22/2012, 2:23 PM

## 2012-07-22 NOTE — Progress Notes (Signed)
Clinical Child psychotherapist (CSW) assessed pt and provided pt with sandals, a shirt and socks. Pt will go by EMS when pt is stable for dc. A full assessment to follow.  Theresia Bough, MSW, Theresia Majors [phone]

## 2012-07-23 ENCOUNTER — Encounter (HOSPITAL_COMMUNITY): Payer: Self-pay | Admitting: Pediatrics

## 2012-07-23 DIAGNOSIS — R079 Chest pain, unspecified: Secondary | ICD-10-CM

## 2012-07-23 DIAGNOSIS — I5043 Acute on chronic combined systolic (congestive) and diastolic (congestive) heart failure: Secondary | ICD-10-CM | POA: Diagnosis present

## 2012-07-23 DIAGNOSIS — I059 Rheumatic mitral valve disease, unspecified: Secondary | ICD-10-CM

## 2012-07-23 LAB — GLUCOSE, CAPILLARY: Glucose-Capillary: 104 mg/dL — ABNORMAL HIGH (ref 70–99)

## 2012-07-23 LAB — BASIC METABOLIC PANEL
BUN: 11 mg/dL (ref 6–23)
Chloride: 95 mEq/L — ABNORMAL LOW (ref 96–112)
GFR calc Af Amer: 76 mL/min — ABNORMAL LOW (ref >90)
Potassium: 4.2 mEq/L (ref 3.5–5.1)

## 2012-07-23 LAB — CBC
HCT: 33.9 % — ABNORMAL LOW (ref 36.0–46.0)
Hemoglobin: 10.8 g/dL — ABNORMAL LOW (ref 12.0–15.0)
RDW: 19.3 % — ABNORMAL HIGH (ref 11.5–15.5)
WBC: 7 10*3/uL (ref 4.0–10.5)

## 2012-07-23 LAB — PROTIME-INR: INR: 1.26 (ref 0.00–1.49)

## 2012-07-23 MED ORDER — FUROSEMIDE 10 MG/ML IJ SOLN
40.0000 mg | Freq: Two times a day (BID) | INTRAMUSCULAR | Status: DC
  Administered 2012-07-23: 40 mg via INTRAVENOUS
  Filled 2012-07-23: qty 4

## 2012-07-23 MED ORDER — POTASSIUM CHLORIDE CRYS ER 20 MEQ PO TBCR
40.0000 meq | EXTENDED_RELEASE_TABLET | Freq: Once | ORAL | Status: AC
  Administered 2012-07-23: 40 meq via ORAL
  Filled 2012-07-23: qty 2

## 2012-07-23 MED ORDER — BISOPROLOL FUMARATE 5 MG PO TABS
2.5000 mg | ORAL_TABLET | Freq: Every day | ORAL | Status: DC
  Administered 2012-07-23: 2.5 mg via ORAL
  Filled 2012-07-23: qty 0.5

## 2012-07-23 MED ORDER — ATORVASTATIN CALCIUM 20 MG PO TABS: 20.0000 mg | ORAL_TABLET | Freq: Every day | ORAL | Status: DC

## 2012-07-23 MED ORDER — BISOPROLOL FUMARATE 5 MG PO TABS: 2.5000 mg | ORAL_TABLET | Freq: Every day | ORAL | Status: DC

## 2012-07-23 MED ORDER — ASPIRIN 81 MG PO TBEC: 81.0000 mg | DELAYED_RELEASE_TABLET | Freq: Every day | ORAL | Status: DC

## 2012-07-23 MED ORDER — LISINOPRIL 5 MG PO TABS: 5.0000 mg | ORAL_TABLET | Freq: Every day | ORAL | Status: DC

## 2012-07-23 MED ORDER — LEVOTHYROXINE SODIUM 25 MCG PO TABS: 25.0000 ug | ORAL_TABLET | Freq: Every day | ORAL | Status: DC

## 2012-07-23 MED ORDER — FUROSEMIDE 80 MG PO TABS: 160.0000 mg | ORAL_TABLET | Freq: Every day | ORAL | Status: DC

## 2012-07-23 MED ORDER — WARFARIN SODIUM 5 MG PO TABS: 7.5000 mg | ORAL_TABLET | Freq: Every day | ORAL | Status: DC

## 2012-07-23 MED ORDER — WARFARIN SODIUM 7.5 MG PO TABS
7.5000 mg | ORAL_TABLET | Freq: Once | ORAL | Status: AC
  Administered 2012-07-23: 7.5 mg via ORAL
  Filled 2012-07-23: qty 1

## 2012-07-23 MED ORDER — HEPARIN SODIUM (PORCINE) 5000 UNIT/ML IJ SOLN
5000.0000 [IU] | Freq: Three times a day (TID) | INTRAMUSCULAR | Status: DC
  Administered 2012-07-23: 5000 [IU] via SUBCUTANEOUS
  Filled 2012-07-23 (×3): qty 1

## 2012-07-23 NOTE — Progress Notes (Signed)
Pt to d/c to her friend's home in [facility] today via [facility]. RNCM assisting with meds and HH services. CSW provided pt with pants and will facilitate ambulance transport. CSW signing off as no other CSW needs identified.  Dellie Burns, MSW, Theresia Majors [phone] (Weekends 8:00am-4:30pm) After Hours for on-call Clinical Social Work:  [email or web address] Password: [email or web address]
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
## 2012-07-23 NOTE — Progress Notes (Signed)
  Echocardiogram 2D Echocardiogram has been performed.  Georgian Co 07/23/2012, 10:34 AM

## 2012-07-23 NOTE — Progress Notes (Signed)
ANTICOAGULATION CONSULT NOTE - Follow up Consult  Pharmacy Consult for Coumadin Indication: chest pain/ACS/ hx of Afib  Allergies  Allergen Reactions  . Vesicare (Solifenacin)     Makes my sweat turn yellow    Labs:  Basename 07/23/12 0605 07/22/12 1230 07/22/12 0645 07/21/12 2100 07/21/12 2056 07/21/12 1323 07/21/12 0500  HGB 10.8* -- 10.6* -- -- -- --  HCT 33.9* -- 33.4* -- -- -- 33.5*  PLT 272 -- 265 -- -- -- 266  APTT -- -- -- -- -- -- --  LABPROT 16.1* -- 15.0 -- -- -- 15.1  INR 1.26 -- 1.16 -- -- -- 1.17  HEPARINUNFRC -- -- 0.32 0.42 -- <0.10* --  CREATININE 1.01 -- 1.01 0.99 -- -- --  CKTOTAL -- 333* 319* -- 291* -- --  CKMB -- 6.3* 5.9* -- 6.5* -- --  TROPONINI -- <0.30 <0.30 -- <0.30 -- --    Estimated Creatinine Clearance: 86.7 ml/min (by C-G formula based on Cr of 1.01).  Assessment: 46 yo female with CP/SOB/CHF non-obstructive CAD/EF 40% on cath 8/16.  Heparin drip changed to sq for dvt px.  Coumadin restarted INR 1.26 < goal 2-3, cbc stable.  Pt did complain about 1 episode BRBPR early this am. Pt states she takes 5 mg of Coumadin daily. Goal of Therapy:   Monitor platelets by anticoagulation protocol: Yes INR goal 2-3   Plan:   1) Coumadin 7.5 mg PO x1 2) Follow up INR in AM  Leota Sauers Pharm.D. CPP, BCPS Clinical Pharmacist [phone] 07/23/2012 11:30 AM
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
## 2012-07-23 NOTE — Progress Notes (Signed)
Subjective:  Results of cath noted. EF 40%. Non-obs CAD  No CP this am. C/o blood from rectum. Sitting up eating breakfast.   Pacemaker reprogrammed last night so as not to track AFL.   Objective: Filed Vitals:   07/23/12 0000 07/23/12 0316 07/23/12 0318 07/23/12 0742  BP:   125/82   Pulse:      Temp: 98.4 F (36.9 C) 98.1 F (36.7 C)  97.8 F (36.6 C)  TempSrc: Oral Oral  Oral  Resp:    18  Height:      Weight:      SpO2:    99%   Weight change:   Intake/Output Summary (Last 24 hours) at 07/23/12 4540 Last data filed at 07/23/12 0300  Gross per 24 hour  Intake   1020 ml  Output   2000 ml  Net   -980 ml    General: Alert, awake, oriented x3, in no acute distress Neck:  JVP is 9-10 Heart: Regular rate and rhythm, without murmurs, rubs, gallops.  Chest:  Tender. Lungs: Clear to auscultation.  Exemities:  1+ edema.  R radial cath site is fine Neuro: Grossly intact, nonfocal.  Tele:  SR  Ventricular paced.39s  Lab Results: Results for orders placed during the hospital encounter of 07/20/12 (from the past 24 hour(s))  GLUCOSE, CAPILLARY     Status: Abnormal   Collection Time   07/22/12 11:03 AM      Component Value Range   Glucose-Capillary 109 (*) 70 - 99 mg/dL   Comment 1 Notify RN    CARDIAC PANEL(CRET KIN+CKTOT+MB+TROPI)     Status: Abnormal   Collection Time   07/22/12 12:30 PM      Component Value Range   Total CK 333 (*) 7 - 177 U/L   CK, MB 6.3 (*) 0.3 - 4.0 ng/mL   Troponin I <0.30  <0.30 ng/mL   Relative Index 1.9  0.0 - 2.5  GLUCOSE, CAPILLARY     Status: Abnormal   Collection Time   07/22/12  3:17 PM      Component Value Range   Glucose-Capillary 114 (*) 70 - 99 mg/dL   Comment 1 Notify RN    GLUCOSE, CAPILLARY     Status: Abnormal   Collection Time   07/22/12  6:35 PM      Component Value Range   Glucose-Capillary 120 (*) 70 - 99 mg/dL  GLUCOSE, CAPILLARY     Status: Abnormal   Collection Time   07/22/12 10:48 PM      Component Value Range     Glucose-Capillary 154 (*) 70 - 99 mg/dL   Comment 1 Documented in Chart     Comment 2 Notify RN    CBC     Status: Abnormal   Collection Time   07/23/12  6:05 AM      Component Value Range   WBC 7.0  4.0 - 10.5 K/uL   RBC 3.46 (*) 3.87 - 5.11 MIL/uL   Hemoglobin 10.8 (*) 12.0 - 15.0 g/dL   HCT 98.1 (*) 19.1 - 47.8 %   MCV 98.0  78.0 - 100.0 fL   MCH 31.2  26.0 - 34.0 pg   MCHC 31.9  30.0 - 36.0 g/dL   RDW 29.5 (*) 62.1 - 30.8 %   Platelets 272  150 - 400 K/uL  BASIC METABOLIC PANEL     Status: Abnormal   Collection Time   07/23/12  6:05 AM      Component Value  Range   Sodium 136  135 - 145 mEq/L   Potassium 4.2  3.5 - 5.1 mEq/L   Chloride 95 (*) 96 - 112 mEq/L   CO2 32  19 - 32 mEq/L   Glucose, Bld 100 (*) 70 - 99 mg/dL   BUN 11  6 - 23 mg/dL   Creatinine, Ser 0.98  0.50 - 1.10 mg/dL   Calcium 8.6  8.4 - 11.9 mg/dL   GFR calc non Af Amer 65 (*) >90 mL/min   GFR calc Af Amer 76 (*) >90 mL/min  PROTIME-INR     Status: Abnormal   Collection Time   07/23/12  6:05 AM      Component Value Range   Prothrombin Time 16.1 (*) 11.6 - 15.2 seconds   INR 1.26  0.00 - 1.49  GLUCOSE, CAPILLARY     Status: Abnormal   Collection Time   07/23/12  7:44 AM      Component Value Range   Glucose-Capillary 104 (*) 70 - 99 mg/dL   Comment 1 Notify RN     Assessment: 1. Atypical chest pain      --cath with nonobstructive CAD 2. Atrial flutter/fibrillation  3. 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 ? etiology 4. Type 2 DM  5. HTN  6. COPD with ongoing tobacco abuse 7. Hepatitis C with cirrhosis  Plan/Discussion:  Doing well from a cardiac standpoint. Etiology of cardiomyopathy unclear bu may be tachy-related. Pacemaker settings adjusted. She does have mild fluid overload so will give IV lasix this am. Will start low-dose b-blocker and ACE-I. Resume coumadin if rectal bleeding not severe. Stable from cardiology standpoint for d/c later from our standpoint with f/u  with her cardiologist in [place].   LOS: 3 days   Kim [name] 07/23/2012, 8:22 AM

## 2012-08-29 ENCOUNTER — Emergency Department (HOSPITAL_COMMUNITY): Payer: Medicaid Other

## 2012-08-29 ENCOUNTER — Other Ambulatory Visit: Payer: Self-pay

## 2012-08-29 ENCOUNTER — Emergency Department (HOSPITAL_COMMUNITY)
Admission: EM | Admit: 2012-08-29 | Discharge: 2012-08-29 | Disposition: A | Discharge status: Home / Self Care | Payer: Medicaid Other | Attending: Emergency Medicine | Admitting: Emergency Medicine

## 2012-08-29 ENCOUNTER — Encounter (HOSPITAL_COMMUNITY): Payer: Self-pay | Admitting: Emergency Medicine

## 2012-08-29 DIAGNOSIS — I509 Heart failure, unspecified: Secondary | ICD-10-CM | POA: Insufficient documentation

## 2012-08-29 DIAGNOSIS — F17200 Nicotine dependence, unspecified, uncomplicated: Secondary | ICD-10-CM | POA: Insufficient documentation

## 2012-08-29 DIAGNOSIS — Z79899 Other long term (current) drug therapy: Secondary | ICD-10-CM | POA: Insufficient documentation

## 2012-08-29 DIAGNOSIS — J441 Chronic obstructive pulmonary disease with (acute) exacerbation: Secondary | ICD-10-CM | POA: Insufficient documentation

## 2012-08-29 DIAGNOSIS — B1920 Unspecified viral hepatitis C without hepatic coma: Secondary | ICD-10-CM | POA: Insufficient documentation

## 2012-08-29 DIAGNOSIS — E079 Disorder of thyroid, unspecified: Secondary | ICD-10-CM | POA: Insufficient documentation

## 2012-08-29 DIAGNOSIS — Z950 Presence of cardiac pacemaker: Secondary | ICD-10-CM | POA: Insufficient documentation

## 2012-08-29 DIAGNOSIS — I252 Old myocardial infarction: Secondary | ICD-10-CM | POA: Insufficient documentation

## 2012-08-29 DIAGNOSIS — Z7901 Long term (current) use of anticoagulants: Secondary | ICD-10-CM | POA: Insufficient documentation

## 2012-08-29 DIAGNOSIS — K219 Gastro-esophageal reflux disease without esophagitis: Secondary | ICD-10-CM | POA: Insufficient documentation

## 2012-08-29 MED ORDER — PREDNISONE 10 MG PO TABS: 40.0000 mg | ORAL_TABLET | Freq: Every day | ORAL | Status: DC

## 2012-08-29 MED ORDER — OXYCODONE-ACETAMINOPHEN 5-325 MG PO TABS
1.0000 | ORAL_TABLET | Freq: Once | ORAL | Status: AC
  Administered 2012-08-29: 1 via ORAL
  Filled 2012-08-29: qty 1

## 2012-08-29 NOTE — ED Notes (Signed)
Pt again requesting pain medications.  States "tell the doctor I have a really bad migraine." No photophobia or changes in behavior noted. Pt denies nausea and requesting a sandwich.  Physician notified of pain medication request.

## 2012-08-29 NOTE — ED Notes (Addendum)
Pt angry that she will not be transferred by EMS home.  Pt states that she has to wear her oxygen continuously and doesn't have a ride home.  Again confronted pt about what she reports regarding her oxygen.  Pt did admit that she doesn't wear it most of the day, despite the fact that it is ordered that way.  Offered to allow pt to use bed in hall and be hooked to a portable oxygen tank.  Pt demanding to be transferred to [facility].  Explained to pt that since she has been assessed, treated and discharged by ED physician there is no medical justification to be transported to [place]. Pt states that "but that's where my doctor and records are."  Encouraged pt to be seen at facility of choice, if she desires, as she has the right to do so.  Again reinforced that since there is no acute distress or medical need at this time, there will not be transportation provided. Pt signed discharge paperwork, but continues to be angry.

## 2012-08-29 NOTE — ED Provider Notes (Signed)
History   This chart was scribed for Shelda Jakes, MD by Charolett Bumpers . The patient was seen in room APA05/APA05. Patient's care was started at 1825.    CSN: [id number]  Arrival date & time 08/29/12  1805   First MD Initiated Contact with Patient 08/29/12 1825      Chief Complaint  Patient presents with  . Shortness of Breath    (Consider location/radiation/quality/duration/timing/severity/associated sxs/prior treatment) HPI Comments: ALLICIA CULLEY is a 46 y.o. female who presents to the Emergency Department complaining of SOB for the past 5 days that worsened today. Pt states that uses albuterol inhaler and Symbicort at home with no relief. She reports an associated productive cough with yellow and red phlegm. She states she has been taking Mucinex with minimal relief. She is on 2-3L of O2 at home for her h/o COPD. EMS administered 125 mg IV Solumedrol and a duo nebulizer PTA.   Dr. Bradd Canary at [facility].   Patient is a 46 y.o. female presenting with shortness of breath. The history is provided by the patient.  Shortness of Breath  The current episode started 3 to 5 days ago. The onset was gradual. The problem occurs continuously. The problem has been gradually worsening. Associated symptoms include cough and shortness of breath. Pertinent negatives include no chest pain and no fever.    Past Medical History  Diagnosis Date  . Pacemaker     vent paced  . COPD (chronic obstructive pulmonary disease)   . CHF (congestive heart failure)   . Thyroid disease   . Hepatitis     Hep C  . MI (myocardial infarction)   . Pneumonia   . Dysrhythmia     atrial fibrilation  . Cirrhosis   . Shortness of breath   . Diabetes mellitus   . Chest pain   . GERD (gastroesophageal reflux disease)   . Headache   . Anxiety     Past Surgical History  Procedure Date  . Cholecystectomy   . Pacemaker insertion   . Insert / replace / remove pacemaker     02/2012     Family History  Problem Relation Age of Onset  . Coronary artery disease Father 54  . Coronary artery disease Mother 40  . Coronary artery disease Brother     History  Substance Use Topics  . Smoking status: Current Every Day Smoker -- 0.5 packs/day for 30 years    Types: Cigarettes  . Smokeless tobacco: Former Neurosurgeon    Types: Snuff  . Alcohol Use: No    OB History    Grav Para Term Preterm Abortions TAB SAB Ect Mult Living                  Review of Systems  Constitutional: Negative for fever and chills.  Respiratory: Positive for cough and shortness of breath.   Cardiovascular: Negative for chest pain.  Gastrointestinal: Negative for nausea, vomiting and abdominal pain.  Skin: Negative for rash.  All other systems reviewed and are negative.    Allergies  Vesicare  Home Medications   Current Outpatient Rx  Name Route Sig Dispense Refill  . ALBUTEROL SULFATE HFA 108 (90 BASE) MCG/ACT IN AERS Inhalation Inhale 2 puffs into the lungs every 6 (six) hours as needed.    . ASPIRIN 81 MG PO TBEC Oral Take 1 tablet (81 mg total) by mouth daily.    . ATORVASTATIN CALCIUM 20 MG PO TABS Oral Take 1  tablet (20 mg total) by mouth daily at 6 PM. 30 tablet 1  . BISOPROLOL FUMARATE 5 MG PO TABS Oral Take 0.5 tablets (2.5 mg total) by mouth daily. 30 tablet 1  . SYMBICORT IN Inhalation Inhale 2 puffs into the lungs 2 (two) times daily.    Marland Kitchen CALCIUM CARBONATE 600 MG PO TABS Oral Take 1,200 mg by mouth every morning.    Marland Kitchen HYDROCOD POLST-CPM POLST ER 10-8 MG/5ML PO LQCR Oral Take 5 mLs by mouth every 12 (twelve) hours as needed. 140 mL 0  . CLOTRIMAZOLE-BETAMETHASONE 1-0.05 % EX CREA Topical Apply 1 application topically daily. For places in scalp    . ESOMEPRAZOLE MAGNESIUM 40 MG PO CPDR Oral Take 40 mg by mouth every morning.    Marland Kitchen FERROUS SULFATE 325 (65 FE) MG PO TABS Oral Take 650 mg by mouth every morning.    . FUROSEMIDE 80 MG PO TABS Oral Take 2 tablets (160 mg total) by  mouth daily. 60 tablet 1    This is a Month's supply  . HYDROXYZINE HCL 10 MG PO TABS Oral Take 10 mg by mouth 3 (three) times daily as needed. For itching and/or anxiety    . LEVOTHYROXINE SODIUM 25 MCG PO TABS Oral Take 1 tablet (25 mcg total) by mouth daily before breakfast. 30 tablet 1  . LISINOPRIL 5 MG PO TABS Oral Take 1 tablet (5 mg total) by mouth daily. 30 tablet 1  . METFORMIN HCL 500 MG PO TABS Oral Take 500 mg by mouth 2 (two) times daily with a meal.    . METOCLOPRAMIDE HCL 10 MG PO TABS Oral Take 10 mg by mouth every 8 (eight) hours as needed. For nausea and indigestion    . OXYCODONE HCL ER 10 MG PO TB12 Oral Take 10 mg by mouth daily as needed. For pain    . PREDNISONE 10 MG PO TABS Oral Take 4 tablets (40 mg total) by mouth daily. 20 tablet 0  . TIOTROPIUM BROMIDE MONOHYDRATE 18 MCG IN CAPS Inhalation Place 18 mcg into inhaler and inhale daily.    . WARFARIN SODIUM 5 MG PO TABS Oral Take 1.5 tablets (7.5 mg total) by mouth daily. 30 tablet 1    BP 136/82  Pulse 75  Temp 98.1 F (36.7 C)  Resp 22  Wt 243 lb (110.224 kg)  SpO2 95%  Physical Exam  Nursing note and vitals reviewed. Constitutional: She is oriented to person, place, and time. She appears well-developed and well-nourished. No distress.  HENT:  Head: Normocephalic and atraumatic.  Eyes: EOM are normal. Pupils are equal, round, and reactive to light.  Neck: Normal range of motion. Neck supple. No tracheal deviation present.  Cardiovascular: Normal rate, regular rhythm and normal heart sounds.   No murmur heard. Pulmonary/Chest: Effort normal and breath sounds normal. No respiratory distress. She has no wheezes.  Abdominal: Soft. Bowel sounds are normal. She exhibits no distension. There is tenderness.       Generalized abdominal tenderness from coughing.   Musculoskeletal: Normal range of motion. She exhibits no edema.       No swelling of the lower extremities. Moves all extremities.   Neurological: She  is alert and oriented to person, place, and time. No cranial nerve deficit.       Cranial nerves intact.   Skin: Skin is warm and dry.  Psychiatric: She has a normal mood and affect. Her behavior is normal.    ED Course  Procedures (including  critical care time)  DIAGNOSTIC STUDIES: Oxygen Saturation is 99% on 2L [place], normal by my interpretation.    COORDINATION OF CARE:  18:35-Discussed planned course of treatment with the patient including a chest x-ray and breathing treatments, who is agreeable at this time.   20:00-Medication Orders: Oxycodone-acetaminophen (Percocet/Roxicet) 5-325 mg per tablet 1 tablet-once.   20:09-Recheck: Informed pt of negative x-ray results. Will d/c home with steroids.   Labs Reviewed - No data to display Dg Chest 2 View  08/29/2012  *RADIOLOGY REPORT*  Clinical Data: 46 year old female shortness of breath productive cough headache pain pacemaker  CHEST - 2 VIEW  Comparison: 07/20/2012 and earlier.  Findings: Improved lung volumes and bibasilar ventilation. Stable cardiomegaly and mediastinal contours.  Stable left chest cardiac pacemaker.  No pneumothorax or pulmonary edema.  No definite effusion.  No areas of worsening ventilation. No acute osseous abnormality identified.  IMPRESSION: Interval improved lung volumes and bibasilar ventilation.  No acute cardiopulmonary abnormality.   Original Report Authenticated By: Harley Hallmark, M.D.      Date: 08/29/2012  Rate: 73  Rhythm: ventricular paced  QRS Axis: ventricular paced  Intervals: ventricular paced  ST/T Wave abnormalities: ventricular paced  Conduction Disutrbances:ventricular paced  Narrative Interpretation:   Old EKG Reviewed: unchanged From 07/21/12   1. COPD exacerbation       MDM  Patient treated with a tub-year-old nebulizer treatment in route and given Solu-Medrol all wheezing has resolved and has not returned. We'll continue prednisone 60 mg at home. Chest x-rays negative for any  evidence of congestive heart failure pneumonia or pneumothorax. Patient will followup with her primary care Dr. In the next few days. Patient is improved nontoxic no acute distress.  I personally performed the services described in this documentation, which was scribed in my presence. The recorded information has been reviewed and considered.        Shelda Jakes, MD 08/29/12 2014

## 2012-08-29 NOTE — ED Notes (Signed)
Pt ambulated without difficulty to waiting area.

## 2012-08-29 NOTE — ED Notes (Addendum)
Pt returned from x-ray.  No distress noted.  Reporting headache.  Provided with drink as requested.  Pt does report that she has been without her medications for about a week and did call physician today and had them request refills from pharmacy.

## 2012-08-29 NOTE — ED Notes (Signed)
Pt has removed her oxygen and been on room air for 30 minutes.  No signs of respiratory distress noted. Explained to pt that she is welcome to stay in the waiting area and work on finding a ride home.  Pt now states "I am having problems when I cough."  No cough noted from pt at this time.  Attempted to check O2 saturation but pt refused stating, "I'll just have it checked when I go to my doctor."

## 2012-08-29 NOTE — ED Notes (Signed)
Patient transported to X-ray 

## 2012-08-29 NOTE — ED Notes (Signed)
Sob x 1 week. Duo neb and 125mg  iv solumedrol pta.

## 2012-08-29 NOTE — ED Notes (Signed)
[facility] notified of patient needing transportation back home.

## 2012-08-29 NOTE — ED Notes (Signed)
Pt told this nurse that she uses oxygen 24 hours a day and that is why she needs a ride home by ems.  Paramedics arrive that report pt told them that she only uses oxygen at night.  When asked pt about the discrepancy, she admitted that she uses oxygen mostly at night but that she has been unable to get in contact with family that can pick her up.  Pt states [facility] "always gives me a ride home"  Regardless of her oxygen use.   Informed pt that if she got a ride with ems tonight she would be responsible for the transportation bill.  Pt denied that she would do that

## 2012-08-29 NOTE — ED Notes (Signed)
Pt states she continues to work on finding a way home.  Continues to request food and drink.  Pt provided with glass of water.  Pt continues to not show signs of respiratory distress.

## 2012-12-22 ENCOUNTER — Encounter (HOSPITAL_COMMUNITY): Payer: Self-pay

## 2012-12-22 ENCOUNTER — Emergency Department (HOSPITAL_COMMUNITY)
Admission: EM | Admit: 2012-12-22 | Discharge: 2012-12-22 | Disposition: A | Discharge status: Home / Self Care | Payer: Medicaid Other | Attending: Emergency Medicine | Admitting: Emergency Medicine

## 2012-12-22 DIAGNOSIS — K219 Gastro-esophageal reflux disease without esophagitis: Secondary | ICD-10-CM | POA: Insufficient documentation

## 2012-12-22 DIAGNOSIS — Z8619 Personal history of other infectious and parasitic diseases: Secondary | ICD-10-CM | POA: Insufficient documentation

## 2012-12-22 DIAGNOSIS — Z8701 Personal history of pneumonia (recurrent): Secondary | ICD-10-CM | POA: Insufficient documentation

## 2012-12-22 DIAGNOSIS — E119 Type 2 diabetes mellitus without complications: Secondary | ICD-10-CM | POA: Insufficient documentation

## 2012-12-22 DIAGNOSIS — F411 Generalized anxiety disorder: Secondary | ICD-10-CM | POA: Insufficient documentation

## 2012-12-22 DIAGNOSIS — J4489 Other specified chronic obstructive pulmonary disease: Secondary | ICD-10-CM | POA: Insufficient documentation

## 2012-12-22 DIAGNOSIS — Z79899 Other long term (current) drug therapy: Secondary | ICD-10-CM | POA: Insufficient documentation

## 2012-12-22 DIAGNOSIS — Z7982 Long term (current) use of aspirin: Secondary | ICD-10-CM | POA: Insufficient documentation

## 2012-12-22 DIAGNOSIS — F17200 Nicotine dependence, unspecified, uncomplicated: Secondary | ICD-10-CM | POA: Insufficient documentation

## 2012-12-22 DIAGNOSIS — E079 Disorder of thyroid, unspecified: Secondary | ICD-10-CM | POA: Insufficient documentation

## 2012-12-22 DIAGNOSIS — R109 Unspecified abdominal pain: Secondary | ICD-10-CM | POA: Insufficient documentation

## 2012-12-22 DIAGNOSIS — R110 Nausea: Secondary | ICD-10-CM | POA: Insufficient documentation

## 2012-12-22 DIAGNOSIS — Z8719 Personal history of other diseases of the digestive system: Secondary | ICD-10-CM | POA: Insufficient documentation

## 2012-12-22 DIAGNOSIS — Z8679 Personal history of other diseases of the circulatory system: Secondary | ICD-10-CM | POA: Insufficient documentation

## 2012-12-22 DIAGNOSIS — R300 Dysuria: Secondary | ICD-10-CM | POA: Insufficient documentation

## 2012-12-22 DIAGNOSIS — I252 Old myocardial infarction: Secondary | ICD-10-CM | POA: Insufficient documentation

## 2012-12-22 DIAGNOSIS — N760 Acute vaginitis: Secondary | ICD-10-CM

## 2012-12-22 DIAGNOSIS — L738 Other specified follicular disorders: Secondary | ICD-10-CM | POA: Insufficient documentation

## 2012-12-22 DIAGNOSIS — L739 Follicular disorder, unspecified: Secondary | ICD-10-CM

## 2012-12-22 DIAGNOSIS — I509 Heart failure, unspecified: Secondary | ICD-10-CM | POA: Insufficient documentation

## 2012-12-22 DIAGNOSIS — J449 Chronic obstructive pulmonary disease, unspecified: Secondary | ICD-10-CM | POA: Insufficient documentation

## 2012-12-22 DIAGNOSIS — R6883 Chills (without fever): Secondary | ICD-10-CM | POA: Insufficient documentation

## 2012-12-22 LAB — URINALYSIS, ROUTINE W REFLEX MICROSCOPIC
Glucose, UA: NEGATIVE mg/dL
Hgb urine dipstick: NEGATIVE
Specific Gravity, Urine: 1.025 (ref 1.005–1.030)
Urobilinogen, UA: 2 mg/dL — ABNORMAL HIGH (ref 0.0–1.0)

## 2012-12-22 MED ORDER — LIDOCAINE HCL (PF) 1 % IJ SOLN
INTRAMUSCULAR | Status: AC
  Filled 2012-12-22: qty 5

## 2012-12-22 MED ORDER — HYDROCODONE-ACETAMINOPHEN 5-325 MG PO TABS
2.0000 | ORAL_TABLET | Freq: Once | ORAL | Status: AC
  Administered 2012-12-22: 2 via ORAL
  Filled 2012-12-22: qty 2

## 2012-12-22 MED ORDER — LIDOCAINE-EPINEPHRINE (PF) 1 %-1:200000 IJ SOLN
INTRAMUSCULAR | Status: AC
  Filled 2012-12-22: qty 10

## 2012-12-22 MED ORDER — HYDROCODONE-ACETAMINOPHEN 5-325 MG PO TABS
2.0000 | ORAL_TABLET | Freq: Four times a day (QID) | ORAL | Status: DC | PRN
Start: 2012-12-22 — End: 2013-02-07

## 2012-12-22 MED ORDER — SULFAMETHOXAZOLE-TRIMETHOPRIM 800-160 MG PO TABS: 1.0000 | ORAL_TABLET | Freq: Two times a day (BID) | ORAL | Status: DC

## 2012-12-22 NOTE — ED Provider Notes (Signed)
History   Scribed for Geoffery Lyons, MD, the patient was seen in room APA06/APA06 . This chart was scribed by Lewanda Rife.    CSN: [id number]  Arrival date & time 12/22/12  1600   First MD Initiated Contact with Patient 12/22/12 1628      Chief Complaint  Patient presents with  . Abscess    (Consider location/radiation/quality/duration/timing/severity/associated sxs/prior treatment)   Kim [name] is a 47 y.o. female who presents to the Emergency Department complaining of a moderately painful abscess on vagina for the last 4 days. Pt reports pain increases with urination and palpation. Pt reports chills, dysuria, vaginal pruritis, and burning with urination. Pt reports lower abdominal pain. Pt denies fever. Pt denies taking anything to treat pain at home.   Past Medical History  Diagnosis Date  . Pacemaker     vent paced  . COPD (chronic obstructive pulmonary disease)   . CHF (congestive heart failure)   . Thyroid disease   . Hepatitis     Hep C  . MI (myocardial infarction)   . Pneumonia   . Dysrhythmia     atrial fibrilation  . Cirrhosis   . Shortness of breath   . Diabetes mellitus   . Chest pain   . GERD (gastroesophageal reflux disease)   . Headache   . Anxiety     Past Surgical History  Procedure Date  . Cholecystectomy   . Pacemaker insertion   . Insert / replace / remove pacemaker     02/2012    Family History  Problem Relation Age of Onset  . Coronary artery disease Father 15  . Coronary artery disease Mother 57  . Coronary artery disease Brother     History  Substance Use Topics  . Smoking status: Current Every Day Smoker -- 0.5 packs/day for 30 years    Types: Cigarettes  . Smokeless tobacco: Former Neurosurgeon    Types: Snuff  . Alcohol Use: No    OB History    Grav Para Term Preterm Abortions TAB SAB Ect Mult Living                  Review of Systems  Constitutional: Positive for chills.  HENT: Negative.   Respiratory: Negative.     Cardiovascular: Negative.   Gastrointestinal: Positive for nausea and abdominal pain.  Genitourinary: Positive for dysuria. Negative for hematuria.  Musculoskeletal: Negative.   Skin: Negative.   Neurological: Negative.   Hematological: Negative.   Psychiatric/Behavioral: Negative.   All other systems reviewed and are negative.    Allergies  Doxycycline and Vesicare  Home Medications   Current Outpatient Rx  Name  Route  Sig  Dispense  Refill  . ALBUTEROL SULFATE HFA 108 (90 BASE) MCG/ACT IN AERS   Inhalation   Inhale 2 puffs into the lungs every 6 (six) hours as needed.         . ASPIRIN 81 MG PO TBEC   Oral   Take 1 tablet (81 mg total) by mouth daily.         . ATORVASTATIN CALCIUM 20 MG PO TABS   Oral   Take 1 tablet (20 mg total) by mouth daily at 6 PM.   30 tablet   1   . BISOPROLOL FUMARATE 5 MG PO TABS   Oral   Take 0.5 tablets (2.5 mg total) by mouth daily.   30 tablet   1   . SYMBICORT IN   Inhalation   Inhale  2 puffs into the lungs 2 (two) times daily.         Marland Kitchen CALCIUM CARBONATE 600 MG PO TABS   Oral   Take 1,200 mg by mouth every morning.         Marland Kitchen HYDROCOD POLST-CPM POLST ER 10-8 MG/5ML PO LQCR   Oral   Take 5 mLs by mouth every 12 (twelve) hours as needed.   140 mL   0   . CLOTRIMAZOLE-BETAMETHASONE 1-0.05 % EX CREA   Topical   Apply 1 application topically daily. For places in scalp         . ESOMEPRAZOLE MAGNESIUM 40 MG PO CPDR   Oral   Take 40 mg by mouth every morning.         Marland Kitchen FERROUS SULFATE 325 (65 FE) MG PO TABS   Oral   Take 650 mg by mouth every morning.         . FUROSEMIDE 80 MG PO TABS   Oral   Take 2 tablets (160 mg total) by mouth daily.   60 tablet   1     This is a Month's supply   . HYDROXYZINE HCL 10 MG PO TABS   Oral   Take 10 mg by mouth 3 (three) times daily as needed. For itching and/or anxiety         . LEVOTHYROXINE SODIUM 25 MCG PO TABS   Oral   Take 1 tablet (25 mcg total) by  mouth daily before breakfast.   30 tablet   1   . LISINOPRIL 5 MG PO TABS   Oral   Take 1 tablet (5 mg total) by mouth daily.   30 tablet   1   . METFORMIN HCL 500 MG PO TABS   Oral   Take 500 mg by mouth 2 (two) times daily with a meal.         . METOCLOPRAMIDE HCL 10 MG PO TABS   Oral   Take 10 mg by mouth every 8 (eight) hours as needed. For nausea and indigestion         . OXYCODONE HCL ER 10 MG PO TB12   Oral   Take 10 mg by mouth daily as needed. For pain         . PREDNISONE 10 MG PO TABS   Oral   Take 4 tablets (40 mg total) by mouth daily.   20 tablet   0   . TIOTROPIUM BROMIDE MONOHYDRATE 18 MCG IN CAPS   Inhalation   Place 18 mcg into inhaler and inhale daily.         . WARFARIN SODIUM 5 MG PO TABS   Oral   Take 1.5 tablets (7.5 mg total) by mouth daily.   30 tablet   1     BP 150/86  Pulse 74  Temp 98.2 F (36.8 C) (Oral)  Resp 18  Ht 5\' 6"  (1.676 m)  Wt 224 lb (101.606 kg)  BMI 36.15 kg/m2  SpO2 100%  Physical Exam  Nursing note and vitals reviewed. Constitutional: She is oriented to person, place, and time. She appears well-developed and well-nourished.  HENT:  Head: Normocephalic and atraumatic.  Eyes: Conjunctivae normal are normal. Pupils are equal, round, and reactive to light.  Neck: Neck supple. No tracheal deviation present. No thyromegaly present.  Cardiovascular: Normal rate and regular rhythm.   No murmur heard. Pulmonary/Chest: Effort normal and breath sounds normal.  Abdominal: Soft. Bowel sounds are normal.  She exhibits no distension. There is no tenderness.  Genitourinary:    There is erythema and tenderness around the vagina. No vaginal discharge found.  Musculoskeletal: Normal range of motion. She exhibits no edema and no tenderness.  Neurological: She is alert and oriented to person, place, and time. Coordination normal.  Skin: Skin is warm and dry. No rash noted.  Psychiatric: She has a normal mood and affect.     ED Course  Procedures (including critical care time)  Labs Reviewed  GLUCOSE, CAPILLARY - Abnormal; Notable for the following:    Glucose-Capillary 140 (*)     All other components within normal limits   No results found.   No diagnosis found.    MDM  On repeat exam on the gyne bed with good visualization, I am unable to say for sure that I appreciate a Bartholins cyst.  I have discussed this with the patient and have decided that I will treat with antibiotics, sits baths and give this a little more time.  She is to return if this worsens.   I personally performed the services described in this documentation, which was scribed in my presence. The recorded information has been reviewed and is accurate.          Geoffery Lyons, MD 12/22/12 [phone]

## 2012-12-22 NOTE — ED Notes (Signed)
Pt reports has abscess on vagina x 3 days.  Denies fever but has been having fever and c/o pain in r side of abd.  Reports nausea.

## 2013-02-07 ENCOUNTER — Inpatient Hospital Stay (HOSPITAL_COMMUNITY)
Admission: EM | Admit: 2013-02-07 | Discharge: 2013-02-11 | DRG: 193 | Disposition: A | Discharge status: Home Health | Payer: Medicaid Other | Attending: Internal Medicine | Admitting: Internal Medicine

## 2013-02-07 ENCOUNTER — Emergency Department (HOSPITAL_COMMUNITY): Payer: Medicaid Other

## 2013-02-07 ENCOUNTER — Encounter (HOSPITAL_COMMUNITY): Payer: Self-pay | Admitting: *Deleted

## 2013-02-07 DIAGNOSIS — Z950 Presence of cardiac pacemaker: Secondary | ICD-10-CM

## 2013-02-07 DIAGNOSIS — Z6835 Body mass index (BMI) 35.0-35.9, adult: Secondary | ICD-10-CM

## 2013-02-07 DIAGNOSIS — Z91199 Patient's noncompliance with other medical treatment and regimen due to unspecified reason: Secondary | ICD-10-CM

## 2013-02-07 DIAGNOSIS — I252 Old myocardial infarction: Secondary | ICD-10-CM

## 2013-02-07 DIAGNOSIS — K219 Gastro-esophageal reflux disease without esophagitis: Secondary | ICD-10-CM | POA: Diagnosis present

## 2013-02-07 DIAGNOSIS — I5023 Acute on chronic systolic (congestive) heart failure: Secondary | ICD-10-CM | POA: Diagnosis present

## 2013-02-07 DIAGNOSIS — I4891 Unspecified atrial fibrillation: Secondary | ICD-10-CM | POA: Diagnosis present

## 2013-02-07 DIAGNOSIS — Z79899 Other long term (current) drug therapy: Secondary | ICD-10-CM

## 2013-02-07 DIAGNOSIS — B1920 Unspecified viral hepatitis C without hepatic coma: Secondary | ICD-10-CM | POA: Diagnosis present

## 2013-02-07 DIAGNOSIS — Z7901 Long term (current) use of anticoagulants: Secondary | ICD-10-CM

## 2013-02-07 DIAGNOSIS — J449 Chronic obstructive pulmonary disease, unspecified: Secondary | ICD-10-CM | POA: Diagnosis present

## 2013-02-07 DIAGNOSIS — F17200 Nicotine dependence, unspecified, uncomplicated: Secondary | ICD-10-CM | POA: Diagnosis present

## 2013-02-07 DIAGNOSIS — I5043 Acute on chronic combined systolic (congestive) and diastolic (congestive) heart failure: Secondary | ICD-10-CM

## 2013-02-07 DIAGNOSIS — B182 Chronic viral hepatitis C: Secondary | ICD-10-CM | POA: Diagnosis present

## 2013-02-07 DIAGNOSIS — Z8249 Family history of ischemic heart disease and other diseases of the circulatory system: Secondary | ICD-10-CM

## 2013-02-07 DIAGNOSIS — R079 Chest pain, unspecified: Secondary | ICD-10-CM | POA: Diagnosis present

## 2013-02-07 DIAGNOSIS — E119 Type 2 diabetes mellitus without complications: Secondary | ICD-10-CM | POA: Diagnosis present

## 2013-02-07 DIAGNOSIS — Z9119 Patient's noncompliance with other medical treatment and regimen: Secondary | ICD-10-CM

## 2013-02-07 DIAGNOSIS — F411 Generalized anxiety disorder: Secondary | ICD-10-CM | POA: Diagnosis present

## 2013-02-07 DIAGNOSIS — K7460 Unspecified cirrhosis of liver: Secondary | ICD-10-CM | POA: Diagnosis present

## 2013-02-07 DIAGNOSIS — R51 Headache: Secondary | ICD-10-CM | POA: Diagnosis present

## 2013-02-07 DIAGNOSIS — I509 Heart failure, unspecified: Secondary | ICD-10-CM | POA: Diagnosis present

## 2013-02-07 DIAGNOSIS — I2510 Atherosclerotic heart disease of native coronary artery without angina pectoris: Secondary | ICD-10-CM | POA: Diagnosis present

## 2013-02-07 DIAGNOSIS — J189 Pneumonia, unspecified organism: Principal | ICD-10-CM | POA: Diagnosis present

## 2013-02-07 DIAGNOSIS — J4489 Other specified chronic obstructive pulmonary disease: Secondary | ICD-10-CM | POA: Diagnosis present

## 2013-02-07 LAB — COMPREHENSIVE METABOLIC PANEL
ALT: 21 U/L (ref 0–35)
AST: 31 U/L (ref 0–37)
Albumin: 3.6 g/dL (ref 3.5–5.2)
Alkaline Phosphatase: 67 U/L (ref 39–117)
Calcium: 9.1 mg/dL (ref 8.4–10.5)
Potassium: 3.7 mEq/L (ref 3.5–5.1)
Sodium: 139 mEq/L (ref 135–145)
Total Protein: 7.4 g/dL (ref 6.0–8.3)

## 2013-02-07 LAB — CBC WITH DIFFERENTIAL/PLATELET
Basophils Absolute: 0 10*3/uL (ref 0.0–0.1)
Basophils Relative: 0 % (ref 0–1)
Eosinophils Relative: 2 % (ref 0–5)
HCT: 39 % (ref 36.0–46.0)
Hemoglobin: 12.8 g/dL (ref 12.0–15.0)
Lymphocytes Relative: 28 % (ref 12–46)
MCHC: 32.8 g/dL (ref 30.0–36.0)
MCV: 96.5 fL (ref 78.0–100.0)
Monocytes Absolute: 0.8 10*3/uL (ref 0.1–1.0)
Monocytes Relative: 10 % (ref 3–12)
RDW: 16.6 % — ABNORMAL HIGH (ref 11.5–15.5)

## 2013-02-07 LAB — URINALYSIS, ROUTINE W REFLEX MICROSCOPIC
Glucose, UA: NEGATIVE mg/dL
Hgb urine dipstick: NEGATIVE
Leukocytes, UA: NEGATIVE
Protein, ur: NEGATIVE mg/dL
pH: 8 (ref 5.0–8.0)

## 2013-02-07 LAB — PRO B NATRIURETIC PEPTIDE: Pro B Natriuretic peptide (BNP): 1566 pg/mL — ABNORMAL HIGH (ref 0–125)

## 2013-02-07 MED ORDER — SODIUM CHLORIDE 0.9 % IJ SOLN
3.0000 mL | Freq: Two times a day (BID) | INTRAMUSCULAR | Status: DC
  Administered 2013-02-09 – 2013-02-10 (×2): 3 mL via INTRAVENOUS

## 2013-02-07 MED ORDER — ACETAMINOPHEN 650 MG RE SUPP: 650.0000 mg | Freq: Four times a day (QID) | RECTAL | Status: DC | PRN

## 2013-02-07 MED ORDER — PREDNISONE 50 MG PO TABS
60.0000 mg | ORAL_TABLET | Freq: Once | ORAL | Status: AC
  Administered 2013-02-07: 60 mg via ORAL

## 2013-02-07 MED ORDER — LEVOFLOXACIN 750 MG PO TABS
750.0000 mg | ORAL_TABLET | Freq: Every day | ORAL | Status: DC
  Administered 2013-02-08 – 2013-02-11 (×4): 750 mg via ORAL
  Filled 2013-02-07 (×4): qty 1

## 2013-02-07 MED ORDER — FUROSEMIDE 80 MG PO TABS
160.0000 mg | ORAL_TABLET | Freq: Every day | ORAL | Status: DC
  Administered 2013-02-08 – 2013-02-11 (×4): 160 mg via ORAL
  Filled 2013-02-07 (×4): qty 2

## 2013-02-07 MED ORDER — OXYCODONE-ACETAMINOPHEN 5-325 MG PO TABS
1.0000 | ORAL_TABLET | Freq: Once | ORAL | Status: AC
  Administered 2013-02-07: 1 via ORAL
  Filled 2013-02-07: qty 1

## 2013-02-07 MED ORDER — ONDANSETRON HCL 4 MG/2ML IJ SOLN
4.0000 mg | Freq: Four times a day (QID) | INTRAMUSCULAR | Status: DC | PRN
  Administered 2013-02-08 – 2013-02-11 (×9): 4 mg via INTRAVENOUS
  Filled 2013-02-07 (×9): qty 2

## 2013-02-07 MED ORDER — LEVOFLOXACIN IN D5W 750 MG/150ML IV SOLN
750.0000 mg | INTRAVENOUS | Status: DC
  Administered 2013-02-07: 750 mg via INTRAVENOUS
  Filled 2013-02-07: qty 150

## 2013-02-07 MED ORDER — ATORVASTATIN CALCIUM 20 MG PO TABS
20.0000 mg | ORAL_TABLET | Freq: Every day | ORAL | Status: DC
  Administered 2013-02-08 – 2013-02-11 (×4): 20 mg via ORAL
  Filled 2013-02-07 (×4): qty 1

## 2013-02-07 MED ORDER — DEXTROSE 5 % IV SOLN
1.0000 g | Freq: Three times a day (TID) | INTRAVENOUS | Status: DC
  Administered 2013-02-08 – 2013-02-11 (×12): 1 g via INTRAVENOUS
  Filled 2013-02-07 (×18): qty 1

## 2013-02-07 MED ORDER — VANCOMYCIN HCL IN DEXTROSE 1-5 GM/200ML-% IV SOLN
INTRAVENOUS | Status: AC
  Filled 2013-02-07: qty 200

## 2013-02-07 MED ORDER — WARFARIN SODIUM 2 MG PO TABS
4.0000 mg | ORAL_TABLET | Freq: Once | ORAL | Status: AC
  Administered 2013-02-08: 4 mg via ORAL
  Filled 2013-02-07: qty 1
  Filled 2013-02-07: qty 2

## 2013-02-07 MED ORDER — SODIUM CHLORIDE 0.9 % IV SOLN
250.0000 mL | INTRAVENOUS | Status: DC | PRN
  Administered 2013-02-11: 10 mL via INTRAVENOUS

## 2013-02-07 MED ORDER — ONDANSETRON HCL 4 MG PO TABS
4.0000 mg | ORAL_TABLET | Freq: Four times a day (QID) | ORAL | Status: DC | PRN
  Administered 2013-02-09 – 2013-02-11 (×2): 4 mg via ORAL
  Filled 2013-02-07 (×2): qty 1

## 2013-02-07 MED ORDER — BUDESONIDE-FORMOTEROL FUMARATE 80-4.5 MCG/ACT IN AERO
2.0000 | INHALATION_SPRAY | Freq: Two times a day (BID) | RESPIRATORY_TRACT | Status: DC
  Administered 2013-02-08 – 2013-02-11 (×7): 2 via RESPIRATORY_TRACT
  Filled 2013-02-07: qty 6.9

## 2013-02-07 MED ORDER — ATENOLOL 25 MG PO TABS
50.0000 mg | ORAL_TABLET | Freq: Two times a day (BID) | ORAL | Status: DC
  Administered 2013-02-08 – 2013-02-11 (×8): 50 mg via ORAL
  Filled 2013-02-07 (×8): qty 2

## 2013-02-07 MED ORDER — ALBUTEROL SULFATE (5 MG/ML) 0.5% IN NEBU
5.0000 mg | INHALATION_SOLUTION | Freq: Once | RESPIRATORY_TRACT | Status: AC
  Administered 2013-02-07: 5 mg via RESPIRATORY_TRACT
  Filled 2013-02-07: qty 1

## 2013-02-07 MED ORDER — MORPHINE SULFATE 2 MG/ML IJ SOLN
2.0000 mg | INTRAMUSCULAR | Status: DC | PRN
  Administered 2013-02-08 – 2013-02-11 (×22): 2 mg via INTRAVENOUS
  Filled 2013-02-07 (×22): qty 1

## 2013-02-07 MED ORDER — VANCOMYCIN HCL IN DEXTROSE 1-5 GM/200ML-% IV SOLN
1000.0000 mg | Freq: Three times a day (TID) | INTRAVENOUS | Status: DC
  Administered 2013-02-08 (×4): 1000 mg via INTRAVENOUS
  Filled 2013-02-07 (×4): qty 200

## 2013-02-07 MED ORDER — SODIUM CHLORIDE 0.9 % IJ SOLN: 3.0000 mL | INTRAMUSCULAR | Status: DC | PRN

## 2013-02-07 MED ORDER — TIOTROPIUM BROMIDE MONOHYDRATE 18 MCG IN CAPS
18.0000 ug | ORAL_CAPSULE | Freq: Every day | RESPIRATORY_TRACT | Status: DC
  Administered 2013-02-08 – 2013-02-11 (×4): 18 ug via RESPIRATORY_TRACT
  Filled 2013-02-07: qty 5

## 2013-02-07 MED ORDER — INSULIN ASPART 100 UNIT/ML ~~LOC~~ SOLN
0.0000 [IU] | Freq: Three times a day (TID) | SUBCUTANEOUS | Status: DC
  Administered 2013-02-08 – 2013-02-09 (×2): 3 [IU] via SUBCUTANEOUS
  Administered 2013-02-10 (×2): 2 [IU] via SUBCUTANEOUS
  Administered 2013-02-11: 3 [IU] via SUBCUTANEOUS

## 2013-02-07 MED ORDER — PREDNISONE 20 MG PO TABS
ORAL_TABLET | ORAL | Status: AC
  Filled 2013-02-07: qty 3

## 2013-02-07 MED ORDER — GUAIFENESIN ER 600 MG PO TB12
600.0000 mg | ORAL_TABLET | Freq: Two times a day (BID) | ORAL | Status: DC
  Administered 2013-02-08 – 2013-02-11 (×8): 600 mg via ORAL
  Filled 2013-02-07 (×8): qty 1

## 2013-02-07 MED ORDER — ACETAMINOPHEN 325 MG PO TABS: 650.0000 mg | ORAL_TABLET | Freq: Four times a day (QID) | ORAL | Status: DC | PRN

## 2013-02-07 MED ORDER — DEXTROSE 5 % IV SOLN
1.0000 g | Freq: Three times a day (TID) | INTRAVENOUS | Status: DC
  Filled 2013-02-07 (×6): qty 1

## 2013-02-07 MED ORDER — ALBUTEROL SULFATE HFA 108 (90 BASE) MCG/ACT IN AERS
2.0000 | INHALATION_SPRAY | Freq: Four times a day (QID) | RESPIRATORY_TRACT | Status: DC | PRN
  Administered 2013-02-08 – 2013-02-09 (×3): 2 via RESPIRATORY_TRACT
  Filled 2013-02-07: qty 6.7

## 2013-02-07 MED ORDER — DEXTROSE 5 % IV SOLN
INTRAVENOUS | Status: AC
  Filled 2013-02-07 (×2): qty 1

## 2013-02-07 MED ORDER — VANCOMYCIN HCL IN DEXTROSE 1-5 GM/200ML-% IV SOLN: 1000.0000 mg | Freq: Three times a day (TID) | INTRAVENOUS | Status: DC

## 2013-02-07 MED ORDER — SODIUM CHLORIDE 0.9 % IV SOLN
Freq: Once | INTRAVENOUS | Status: AC
  Administered 2013-02-07: 20 mL/h via INTRAVENOUS

## 2013-02-07 MED ORDER — SODIUM CHLORIDE 0.9 % IJ SOLN
3.0000 mL | Freq: Two times a day (BID) | INTRAMUSCULAR | Status: DC
  Administered 2013-02-08 – 2013-02-09 (×2): 3 mL via INTRAVENOUS

## 2013-02-07 MED ORDER — WARFARIN - PHARMACIST DOSING INPATIENT: Freq: Every day | Status: DC

## 2013-02-07 MED ORDER — IPRATROPIUM BROMIDE 0.02 % IN SOLN
0.5000 mg | Freq: Once | RESPIRATORY_TRACT | Status: AC
  Administered 2013-02-07: 0.5 mg via RESPIRATORY_TRACT
  Filled 2013-02-07: qty 2.5

## 2013-02-07 NOTE — ED Notes (Signed)
Pt states with SOB x 3 months, + cough- productive at times, mid CP constantly

## 2013-02-07 NOTE — ED Provider Notes (Signed)
History  This chart was scribed for Joya Gaskins, MD by Shari Heritage, ED Scribe. The patient was seen in room APA19/APA19. Patient's care was started at 1956.  CSN: [id number]  Arrival date & time 02/07/13  1949   First MD Initiated Contact with Patient 02/07/13 1956      Chief Complaint  Patient presents with  . Shortness of Breath    Patient is a 47 y.o. female presenting with shortness of breath. The history is provided by the patient. No language interpreter was used.  Shortness of Breath Severity:  Moderate Duration:  3 months Timing:  Constant Progression:  Worsening Chronicity:  Chronic Relieved by:  Nothing Worsened by:  Exertion Associated symptoms: chest pain and cough   Associated symptoms: no fever and no vomiting   Chest pain:    Chest pain quality: soreness.   Severity:  Moderate   Timing:  Constant   Chronicity:  Recurrent    HPI Comments: Kim [name] is a 47 y.o. female with history of pacemaker, COPD, congestive heart failure, MI, cirrhosis, atrial fibrillation who presents to the Emergency Department complaining of worsening persistent shortness of breath; intermittent cough; and moderate, constant central chest pain for the past 3 months. CP worse with cough. There is dyspnea upon exertion. Patient says that cough is productive at times. She describes chest pain as soreness. Other symptoms include chills, abdominal swelling, leg swelling, burning dysuria and hematuria. She denies fever, nausea or vomiting. She is on 2L of O2 at home. Patient states that she was admitted to [facility] several weeks ago (January or February) for pneumonia and required thoracentesis. Patient currently smokes cigarettes.  PCP - Loyal Gambler, [facility], [place], [place]   Past Medical History  Diagnosis Date  . Pacemaker     vent paced  . COPD (chronic obstructive pulmonary disease)   . CHF (congestive heart failure)   . Thyroid disease   . Hepatitis      Hep C  . MI (myocardial infarction)   . Pneumonia   . Dysrhythmia     atrial fibrilation  . Cirrhosis   . Shortness of breath   . Diabetes mellitus   . Chest pain   . GERD (gastroesophageal reflux disease)   . Headache   . Anxiety   . A-fib     Past Surgical History  Procedure Laterality Date  . Cholecystectomy    . Pacemaker insertion    . Insert / replace / remove pacemaker      02/2012    Family History  Problem Relation Age of Onset  . Coronary artery disease Father 54  . Coronary artery disease Mother 46  . Coronary artery disease Brother     History  Substance Use Topics  . Smoking status: Current Every Day Smoker -- 0.50 packs/day for 30 years    Types: Cigarettes  . Smokeless tobacco: Former Neurosurgeon    Types: Snuff  . Alcohol Use: No    OB History   Grav Para Term Preterm Abortions TAB SAB Ect Mult Living                  Review of Systems  Constitutional: Positive for chills. Negative for fever.  Respiratory: Positive for cough and shortness of breath.   Cardiovascular: Positive for chest pain and leg swelling.  Gastrointestinal: Negative for nausea and vomiting.  Genitourinary: Positive for dysuria and hematuria.  All other systems reviewed and are negative.  Allergies  Doxycycline and Vesicare  Home Medications   Current Outpatient Rx  Name  Route  Sig  Dispense  Refill  . albuterol (PROVENTIL HFA;VENTOLIN HFA) 108 (90 BASE) MCG/ACT inhaler   Inhalation   Inhale 2 puffs into the lungs every 6 (six) hours as needed.         Marland Kitchen aspirin EC 81 MG EC tablet   Oral   Take 1 tablet (81 mg total) by mouth daily.         Marland Kitchen atorvastatin (LIPITOR) 20 MG tablet   Oral   Take 1 tablet (20 mg total) by mouth daily at 6 PM.   30 tablet   1   . bisoprolol (ZEBETA) 5 MG tablet   Oral   Take 0.5 tablets (2.5 mg total) by mouth daily.   30 tablet   1   . Budesonide-Formoterol Fumarate (SYMBICORT IN)   Inhalation   Inhale 2 puffs into the  lungs 2 (two) times daily.         . calcium carbonate (OS-CAL) 600 MG TABS   Oral   Take 1,200 mg by mouth every morning.         . chlorpheniramine-HYDROcodone (TUSSIONEX PENNKINETIC ER) 10-8 MG/5ML LQCR   Oral   Take 5 mLs by mouth every 12 (twelve) hours as needed.   140 mL   0   . clotrimazole-betamethasone (LOTRISONE) cream   Topical   Apply 1 application topically daily. For places in scalp         . esomeprazole (NEXIUM) 40 MG capsule   Oral   Take 40 mg by mouth every morning.         . ferrous sulfate 325 (65 FE) MG tablet   Oral   Take 650 mg by mouth every morning.         . furosemide (LASIX) 80 MG tablet   Oral   Take 2 tablets (160 mg total) by mouth daily.   60 tablet   1     This is a Month's supply   . HYDROcodone-acetaminophen (NORCO/VICODIN) 5-325 MG per tablet   Oral   Take 2 tablets by mouth every 6 (six) hours as needed for pain.   15 tablet   0   . hydrOXYzine (ATARAX/VISTARIL) 10 MG tablet   Oral   Take 10 mg by mouth 3 (three) times daily as needed. For itching and/or anxiety         . levothyroxine (SYNTHROID, LEVOTHROID) 25 MCG tablet   Oral   Take 1 tablet (25 mcg total) by mouth daily before breakfast.   30 tablet   1   . lisinopril (PRINIVIL,ZESTRIL) 5 MG tablet   Oral   Take 1 tablet (5 mg total) by mouth daily.   30 tablet   1   . metFORMIN (GLUCOPHAGE) 500 MG tablet   Oral   Take 500 mg by mouth 2 (two) times daily with a meal.         . metoCLOPramide (REGLAN) 10 MG tablet   Oral   Take 10 mg by mouth every 8 (eight) hours as needed. For nausea and indigestion         . oxyCODONE (OXYCONTIN) 10 MG 12 hr tablet   Oral   Take 10 mg by mouth daily as needed. For pain         . predniSONE (DELTASONE) 10 MG tablet   Oral   Take 4 tablets (40 mg total) by mouth  daily.   20 tablet   0   . sulfamethoxazole-trimethoprim (BACTRIM DS,SEPTRA DS) 800-160 MG per tablet   Oral   Take 1 tablet by mouth 2  (two) times daily.   14 tablet   0   . tiotropium (SPIRIVA) 18 MCG inhalation capsule   Inhalation   Place 18 mcg into inhaler and inhale daily.         Marland Kitchen warfarin (COUMADIN) 5 MG tablet   Oral   Take 1.5 tablets (7.5 mg total) by mouth daily.   30 tablet   1     BP 163/87  Pulse 82  Temp(Src) 98.4 F (36.9 C) (Oral)  Resp 22  Ht 5\' 6"  (1.676 m)  Wt 231 lb (104.781 kg)  BMI 37.3 kg/m2  SpO2 98%  Physical Exam CONSTITUTIONAL: Chronically ill HEAD: Normocephalic/atraumatic EYES: EOMI/PERRL ENMT: Mucous membranes moist NECK: supple no meningeal signs SPINE:entire spine nontender CV: S1/S2 noted, no murmurs/rubs/gallops noted LUNGS: Wheezing bilaterally, no apparent distress ABDOMEN: soft, nontender, no rebound or guarding GU:no cva tenderness NEURO: Pt is awake/alert, moves all extremitiesx4 EXTREMITIES: pulses normal, full ROM, no peripheral edema SKIN: warm, color normal PSYCH: no abnormalities of mood noted  ED Course  Procedures (including critical care time) DIAGNOSTIC STUDIES: Oxygen Saturation is 98% on room air, normal by my interpretation.    COORDINATION OF CARE: 8:12 PM- Patient informed of current plan for treatment and evaluation and agrees with plan at this time.     Labs Reviewed  COMPREHENSIVE METABOLIC PANEL  CBC WITH DIFFERENTIAL  PROTIME-INR  URINALYSIS, ROUTINE W REFLEX MICROSCOPIC  PRO B NATRIURETIC PEPTIDE   9:27 PM Pt found to have likely pneumonia with effusion She reports 3 days hospital stay last month - this is health care associated Also - reports she recently had effusion removed at [facility] but unable to call those records, though I am able to review records from her PCP and it does mention that she had effusion that resolved.  Will need inpatient stay with IV antibiotics and possible thoracentesis D/w dr Osvaldo Shipper to admit    MDM  Nursing notes including past medical history and social history reviewed and  considered in documentation xrays reviewed and considered Labs/vital reviewed and considered Previous records reviewed and considered        Date: 02/07/2013  Rate: 121  Rhythm: atrial flutter  QRS Axis: left  Intervals: normal  ST/T Wave abnormalities: nonspecific ST changes  Conduction Disutrbances:ventricular paced  Narrative Interpretation:   Old EKG Reviewed: unchanged    I personally performed the services described in this documentation, which was scribed in my presence. The recorded information has been reviewed and is accurate.      Joya Gaskins, MD 02/07/13 2129

## 2013-02-07 NOTE — Progress Notes (Addendum)
ANTICOAGULATION CONSULT NOTE - Initial Consult  Pharmacy Consult for Coumadin Indication: AFIB   Allergies  Allergen Reactions  . Doxycycline     itch  . Nitroglycerin Other (See Comments)    CAUSED NUMBNESS ALL OVER  . Vesicare (Solifenacin)     Makes my sweat turn yellow    Patient Measurements: Height: 5\' 6"  (167.6 cm) Weight: 231 lb (104.781 kg) IBW/kg (Calculated) : 59.3  Vital Signs: Temp: 98.4 F (36.9 C) (03/04 1951) Temp src: Oral (03/04 1951) BP: 163/87 mmHg (03/04 1951) Pulse Rate: 82 (03/04 1951)  Labs:  Recent Labs  02/07/13 2011  HGB 12.8  HCT 39.0  PLT 251  LABPROT 15.5*  INR 1.25  CREATININE 0.72    Estimated Creatinine Clearance: 105.2 ml/min (by C-G formula based on Cr of 0.72).   Medical History: Past Medical History  Diagnosis Date  . Pacemaker     vent paced  . COPD (chronic obstructive pulmonary disease)   . CHF (congestive heart failure)   . Thyroid disease   . Hepatitis     Hep C  . MI (myocardial infarction)   . Pneumonia   . Dysrhythmia     atrial fibrilation  . Cirrhosis   . Shortness of breath   . Diabetes mellitus   . Chest pain   . GERD (gastroesophageal reflux disease)   . Headache   . Anxiety   . A-fib     Medications:  Scheduled:  . [COMPLETED] albuterol  5 mg Nebulization Once  . [COMPLETED] ipratropium  0.5 mg Nebulization Once  . [COMPLETED] oxyCODONE-acetaminophen  1 tablet Oral Once  . [COMPLETED] oxyCODONE-acetaminophen  1 tablet Oral Once  . [COMPLETED] predniSONE      . [COMPLETED] predniSONE  60 mg Oral Once  . warfarin  4 mg Oral Once  . [START ON 02/08/2013] Warfarin - Pharmacist Dosing Inpatient   Does not apply q1800    Assessment: Continuation of Coumadin  PTA Coumadin 4 mg daily  Goal of Therapy:  INR 2-3 Monitor platelets by anticoagulation protocol: Yes   Plan:  Coumadin 4 mg po x1 dose tonight INR/PT daily Monitor CBC for platelets Labs per protocol   02/07/2013,10:35 PM

## 2013-02-07 NOTE — Progress Notes (Signed)
ANTIBIOTIC CONSULT NOTE - INITIAL  Pharmacy Consult for Vancomycin & renal adjustment antibiotics Indication: pneumonia  Allergies  Allergen Reactions  . Doxycycline     itch  . Nitroglycerin Other (See Comments)    CAUSED NUMBNESS ALL OVER  . Vesicare (Solifenacin)     Makes my sweat turn yellow    Patient Measurements: Height: 5\' 6"  (167.6 cm) Weight: 231 lb (104.781 kg) IBW/kg (Calculated) : 59.3   Vital Signs: Temp: 98.4 F (36.9 C) (03/04 1951) Temp src: Oral (03/04 1951) BP: 163/87 mmHg (03/04 1951) Pulse Rate: 82 (03/04 1951) Intake/Output from previous day:   Intake/Output from this shift:    Labs:  Recent Labs  02/07/13 2011  WBC 7.7  HGB 12.8  PLT 251  CREATININE 0.72   Estimated Creatinine Clearance: 105.2 ml/min (by C-G formula based on Cr of 0.72). No results found for this basename: VANCOTROUGH, VANCOPEAK, VANCORANDOM, GENTTROUGH, GENTPEAK, GENTRANDOM, TOBRATROUGH, TOBRAPEAK, TOBRARND, AMIKACINPEAK, AMIKACINTROU, AMIKACIN,  in the last 72 hours   Microbiology: No results found for this or any previous visit (from the past 720 hour(s)).  Medical History: Past Medical History  Diagnosis Date  . Pacemaker     vent paced  . COPD (chronic obstructive pulmonary disease)   . CHF (congestive heart failure)   . Thyroid disease   . Hepatitis     Hep C  . MI (myocardial infarction)   . Pneumonia   . Dysrhythmia     atrial fibrilation  . Cirrhosis   . Shortness of breath   . Diabetes mellitus   . Chest pain   . GERD (gastroesophageal reflux disease)   . Headache   . Anxiety   . A-fib     Medications:  Scheduled:  . [COMPLETED] albuterol  5 mg Nebulization Once  . [COMPLETED] ipratropium  0.5 mg Nebulization Once  . [COMPLETED] oxyCODONE-acetaminophen  1 tablet Oral Once  . oxyCODONE-acetaminophen  1 tablet Oral Once  . [COMPLETED] predniSONE      . [COMPLETED] predniSONE  60 mg Oral Once   Assessment Excellent renal  function Cefepime 1 GM IV every 8 hours started in ED Levaquin 750 mg IV every 24 hours started in ED  Goal of Therapy:  Vancomycin trough level 15-20 mcg/ml  Plan:  Vancomycin 1 GM IV every 8 hours Vancomycin trough at steady state Continue Cefepime and Levaquin as started in ED Labs per protocol  Raquel James, Person Bennett 02/07/2013,9:39 PM

## 2013-02-07 NOTE — H&P (Addendum)
[name] History and Physical  Kim [name]:[id number] DOB: 1966-06-24 DOA: 02/07/2013   PCP: Is a [name] in [place], with [facility]: Her cardiologist is also in [place]  Chief Complaint: Shortness of breath, and chest pain  HPI: Kim [name] is a 47 y.o. female with a past medical history that is complicated and includes atrial fibrillation, pacemaker, nonobstructive coronary artery disease, hypertension, liver cirrhosis, secondary to hepatitis C presents with complaints of shortness of breath, and a cough. She also mentions, chest pain which she states has been ongoing for the last 3 months with no changes. Regarding the chest pain; it's described as a sharp pain in the retrosternal area and 10 out of 10 in intensity. However, the patient appears to be very comfortable and doesn't appear to be in any discomfort at this time.  It's a constant pain. Increases with coughing and deep breathing. She tells me that she was admitted last month to [facility] and had, what sounds like, a thoracentesis. She was given antibiotics. She stayed in the hospital for about 3-4 days and was discharged with oral antibiotics. Patient is a very poor historian and is unable to provide exact dates of this hospitalization and exact course. She also underwent CT scans of her chest during that time. Denies any vomiting, but has been nauseated. Denies any fever, but has had chills. The cough is mostly dry, but sometimes she gets a brownish expectoration. She says her mother has been sick with upper respiratory infection. Shortness of breath is mainly with exertion. She has noted leg swelling once in a while. She is on diuretics at home.    Home Medications: Prior to Admission medications   Medication Sig Start Date End Date Taking? Authorizing Provider  albuterol (PROVENTIL HFA;VENTOLIN HFA) 108 (90 BASE) MCG/ACT inhaler Inhale 2 puffs into the lungs every 6 (six)  hours as needed.   Yes Historical Provider, MD  atenolol (TENORMIN) 50 MG tablet Take 50 mg by mouth 2 (two) times daily.   Yes Historical Provider, MD  atorvastatin (LIPITOR) 20 MG tablet Take 1 tablet (20 mg total) by mouth daily at 6 PM. 07/23/12 07/23/13 Yes Leodis Sias, MD  Budesonide-Formoterol Fumarate (SYMBICORT IN) Inhale 2 puffs into the lungs 2 (two) times daily.   Yes Historical Provider, MD  tiotropium (SPIRIVA) 18 MCG inhalation capsule Place 18 mcg into inhaler and inhale daily.   Yes Historical Provider, MD  warfarin (COUMADIN) 4 MG tablet Take 4 mg by mouth every evening.   Yes Historical Provider, MD  furosemide (LASIX) 80 MG tablet Take 2 tablets (160 mg total) by mouth daily. 07/23/12   Leodis Sias, MD    Allergies:  Allergies  Allergen Reactions  . Doxycycline     itch  . Nitroglycerin Other (See Comments)    CAUSED NUMBNESS ALL OVER  . Vesicare (Solifenacin)     Makes my sweat turn yellow    Past Medical History: Past Medical History  Diagnosis Date  . Pacemaker     vent paced  . COPD (chronic obstructive pulmonary disease)   . CHF (congestive heart failure)   . Thyroid disease   . Hepatitis     Hep C  . MI (myocardial infarction)   . Pneumonia   . Dysrhythmia     atrial fibrilation  . Cirrhosis   . Shortness of breath   . Diabetes mellitus   . Chest pain   . GERD (gastroesophageal reflux disease)   .  Headache   . Anxiety   . A-fib     Past Surgical History  Procedure Laterality Date  . Cholecystectomy    . Pacemaker insertion    . Insert / replace / remove pacemaker      02/2012    Social History:  reports that she has been smoking Cigarettes.  She has a 15 pack-year smoking history. She has quit using smokeless tobacco. Her smokeless tobacco use included Snuff. She reports that she does not drink alcohol or use illicit drugs.  Living Situation: Lives in [place] with her family Activity Level: Is able to emulate independently    Family History:  Family History  Problem Relation Age of Onset  . Coronary artery disease Father 50  . Coronary artery disease Mother 72  . Coronary artery disease Brother      Review of Systems - History obtained from the patient General ROS: positive for  - fatigue Psychological ROS: negative Ophthalmic ROS: negative ENT ROS: negative Allergy and Immunology ROS: negative Hematological and Lymphatic ROS: negative Endocrine ROS: negative Respiratory ROS: as in hpi Cardiovascular ROS: as in hpi Gastrointestinal ROS: positive for - nausea Genito-Urinary ROS: no dysuria, trouble voiding, or hematuria Musculoskeletal ROS: negative Neurological ROS: no TIA or stroke symptoms Dermatological ROS: skin lesions in scalp  Physical Examination  Filed Vitals:   02/07/13 1951 02/07/13 2105  BP: 163/87   Pulse: 82   Temp: 98.4 F (36.9 C)   TempSrc: Oral   Resp: 22   Height: 5\' 6"  (1.676 m)   Weight: 104.781 kg (231 lb)   SpO2: 98% 98%    General appearance: alert, cooperative, appears stated age and no distress Head: Normocephalic, without obvious abnormality, atraumatic Eyes: conjunctivae/corneas clear. PERRL, EOM's intact.  Throat: lips, mucosa, and tongue normal; teeth and gums normal Neck: no adenopathy, no carotid bruit, no JVD, supple, symmetrical, trachea midline and thyroid not enlarged, symmetric, no tenderness/mass/nodules Back: symmetric, no curvature. ROM normal. No CVA tenderness. Resp: Decreased air entry at the right base. Few crackles. No rhonchi. No wheezing. Cardio: regular rate and rhythm, S1, S2 normal, no murmur, click, rub or gallop GI: Nonspecific tenderness diffusely, without any rebound, rigidity, or guarding. Bowel sounds are present. No masses, or organomegaly. Abdomen is mildly distended. Extremities: 1+ edema lower extremities Pulses: 2+ and symmetric Skin: Reddish skin lesion noted in the left scalp Lymph nodes: Cervical, supraclavicular, and  axillary nodes normal. Neurologic: She is alert and oriented x3. No focal neurological deficits are present  Laboratory Data: Results for orders placed during the hospital encounter of 02/07/13 (from the past 48 hour(s))  COMPREHENSIVE METABOLIC PANEL     Status: Abnormal   Collection Time    02/07/13  8:11 PM      Result Value Range   Sodium 139  135 - 145 mEq/L   Potassium 3.7  3.5 - 5.1 mEq/L   Chloride 98  96 - 112 mEq/L   CO2 31  19 - 32 mEq/L   Glucose, Bld 115 (*) 70 - 99 mg/dL   BUN 5 (*) 6 - 23 mg/dL   Creatinine, Ser 4.78  0.50 - 1.10 mg/dL   Calcium 9.1  8.4 - 29.5 mg/dL   Total Protein 7.4  6.0 - 8.3 g/dL   Albumin 3.6  3.5 - 5.2 g/dL   AST 31  0 - 37 U/L   ALT 21  0 - 35 U/L   Alkaline Phosphatase 67  39 - 117 U/L  Total Bilirubin 0.9  0.3 - 1.2 mg/dL   GFR calc non Af Amer >90  >90 mL/min   GFR calc Af Amer >90  >90 mL/min   Comment:            The eGFR has been calculated     using the CKD EPI equation.     This calculation has not been     validated in all clinical     situations.     eGFR's persistently     <90 mL/min signify     possible Chronic Kidney Disease.  CBC WITH DIFFERENTIAL     Status: Abnormal   Collection Time    02/07/13  8:11 PM      Result Value Range   WBC 7.7  4.0 - 10.5 K/uL   RBC 4.04  3.87 - 5.11 MIL/uL   Hemoglobin 12.8  12.0 - 15.0 g/dL   HCT 16.1  09.6 - 04.5 %   MCV 96.5  78.0 - 100.0 fL   MCH 31.7  26.0 - 34.0 pg   MCHC 32.8  30.0 - 36.0 g/dL   RDW 40.9 (*) 81.1 - 91.4 %   Platelets 251  150 - 400 K/uL   Neutrophils Relative 60  43 - 77 %   Neutro Abs 4.6  1.7 - 7.7 K/uL   Lymphocytes Relative 28  12 - 46 %   Lymphs Abs 2.1  0.7 - 4.0 K/uL   Monocytes Relative 10  3 - 12 %   Monocytes Absolute 0.8  0.1 - 1.0 K/uL   Eosinophils Relative 2  0 - 5 %   Eosinophils Absolute 0.2  0.0 - 0.7 K/uL   Basophils Relative 0  0 - 1 %   Basophils Absolute 0.0  0.0 - 0.1 K/uL  PROTIME-INR     Status: Abnormal   Collection Time     02/07/13  8:11 PM      Result Value Range   Prothrombin Time 15.5 (*) 11.6 - 15.2 seconds   INR 1.25  0.00 - 1.49  PRO B NATRIURETIC PEPTIDE     Status: Abnormal   Collection Time    02/07/13  8:11 PM      Result Value Range   Pro B Natriuretic peptide (BNP) 1566.0 (*) 0 - 125 pg/mL  URINALYSIS, ROUTINE W REFLEX MICROSCOPIC     Status: None   Collection Time    02/07/13  8:43 PM      Result Value Range   Color, Urine YELLOW  YELLOW   APPearance CLEAR  CLEAR   Specific Gravity, Urine 1.020  1.005 - 1.030   pH 8.0  5.0 - 8.0   Glucose, UA NEGATIVE  NEGATIVE mg/dL   Hgb urine dipstick NEGATIVE  NEGATIVE   Bilirubin Urine NEGATIVE  NEGATIVE   Ketones, ur NEGATIVE  NEGATIVE mg/dL   Protein, ur NEGATIVE  NEGATIVE mg/dL   Urobilinogen, UA 0.2  0.0 - 1.0 mg/dL   Nitrite NEGATIVE  NEGATIVE   Leukocytes, UA NEGATIVE  NEGATIVE   Comment: MICROSCOPIC NOT DONE ON URINES WITH NEGATIVE PROTEIN, BLOOD, LEUKOCYTES, NITRITE, OR GLUCOSE <1000 mg/dL.    Radiology Reports: Dg Chest 2 View  02/07/2013  *RADIOLOGY REPORT*  Clinical Data: Shortness of breath and congestion.  CHEST - 2 VIEW  Comparison: PA and lateral chest 08/29/2012.  Findings: The patient has a moderate right pleural effusion and dense right basilar airspace disease.  A small amount of fluid is seen in  the minor fissure.  Left lung is clear.  No pneumothorax. Cardiomegaly is noted.  IMPRESSION:  1.  Right pleural effusion and basilar airspace disease worrisome for pneumonia.  Recommend follow-up to clearing. 2.  Cardiomegaly without edema.   Original Report Authenticated By: Holley Dexter, M.D.     Electrocardiogram: Atrial flutter at 120 beats per minute. Left axis deviation is noted. Nonspecific T-wave changes are present. EKG is quite similar to previous one.  Problem List  Principal Problem:   HCAP (healthcare-associated pneumonia) Active Problems:   COPD (chronic obstructive pulmonary disease)   Chest pain   Atrial  fibrillation   Coronary atherosclerosis of native coronary artery   Chronic systolic CHF (congestive heart failure)   Pacemaker   Cirrhosis of liver due to hepatitis C   Assessment: This is a 47 year old, Caucasian female, who looks much older than her stated age. She has a complicated medical history despite her young age. She comes in with chest pain, shortness of breath, and cough. Most of the symptoms have been ongoing for some time now. Chest pain, especially has been ongoing for 3 months. Her x-ray suggests right lower lobe pneumonia. It is unclear if this is a new process or if this is ongoing since her hospitalization at [facility]. Unfortunately, I am not able to pull all the records from [facility] at this time.  Plan: #1 possible healthcare associated pneumonia: Even though she has a normal white blood cell count and is afebrile for now we will cover her with broad-spectrum antibiotics. Will get records of her hospitalization at [facility]. This will give [place] more information and we can make further decisions at that time. She does have a pleural effusion. However, it doesn't appear that it is significant enough. Reports of recent thoracentesis will also help in determining further course.  #2 chest pain: The appearss to be musculoskeletal based on examination. She has been evaluated multiple times for this. She had a cardiac catheterization last year, which showed mild obstructive CAD. Apparently, she's had CAT scans, at [facility], which have been unremarkable. I do not suspect any catastrophic process at this time. Check troponin  #3 history of atrial fibrillation with pacemaker: Her INR still subtherapeutic. We will monitor her on telemetry. Heart rate seems to be better now. She was tachycardic earlier. Continue with warfarin.  #4 history of diabetes: She tells me she'll she was taken off her for metformin recently. Blood sugar is reasonably well controlled. Put her on a sliding scale. Check  HbA1c.  #5 history of liver cirrhosis, secondary to hepatitis C: Liver function tests are normal. Continue to monitor.  DVT Prophylaxis: Resumed warfarin Code Status: Full code Family Communication: Discussed with the patient at bedside  Disposition Plan: Unclear. For now   Further management decisions will depend on results of further testing and patient's response to treatment.  [facility]  [name] Pager [phone]  If 7PM-7AM, please contact night-coverage [email or web address] Password [facility]  02/07/2013, 10:03 PM   Troponin was 0.4. Even though patient has chest pain it is unlikely she has acute coronary syndrome. The chest pain has been ongoing for 3 months. Will start Lovenox till INR is therapeutic. Patient had an ECHo in August and had a cardiac cath as well as discussed above. Will continue to monitor.  KRISHNAN,GOKUL 12:36 AM

## 2013-02-07 NOTE — ED Notes (Signed)
Will send to xray after EKG obtained.

## 2013-02-07 NOTE — ED Notes (Signed)
Patient requested 02 started.  Initiated at 2L per cannula.

## 2013-02-08 DIAGNOSIS — I5043 Acute on chronic combined systolic (congestive) and diastolic (congestive) heart failure: Secondary | ICD-10-CM

## 2013-02-08 DIAGNOSIS — J449 Chronic obstructive pulmonary disease, unspecified: Secondary | ICD-10-CM

## 2013-02-08 LAB — GLUCOSE, CAPILLARY: Glucose-Capillary: 178 mg/dL — ABNORMAL HIGH (ref 70–99)

## 2013-02-08 LAB — COMPREHENSIVE METABOLIC PANEL
ALT: 21 U/L (ref 0–35)
Alkaline Phosphatase: 67 U/L (ref 39–117)
CO2: 27 mEq/L (ref 19–32)
Chloride: 101 mEq/L (ref 96–112)
GFR calc Af Amer: >90 mL/min (ref >90)
Glucose, Bld: 132 mg/dL — ABNORMAL HIGH (ref 70–99)
Potassium: 4.7 mEq/L (ref 3.5–5.1)
Sodium: 138 mEq/L (ref 135–145)
Total Bilirubin: 1 mg/dL (ref 0.3–1.2)
Total Protein: 7.8 g/dL (ref 6.0–8.3)

## 2013-02-08 LAB — PROTIME-INR: INR: 1.21 (ref 0.00–1.49)

## 2013-02-08 LAB — CBC
HCT: 39.4 % (ref 36.0–46.0)
MCHC: 32.5 g/dL (ref 30.0–36.0)
RDW: 16.5 % — ABNORMAL HIGH (ref 11.5–15.5)

## 2013-02-08 LAB — HEMOGLOBIN A1C: Mean Plasma Glucose: 123 mg/dL — ABNORMAL HIGH (ref <117)

## 2013-02-08 MED ORDER — ENOXAPARIN SODIUM 120 MG/0.8ML ~~LOC~~ SOLN
SUBCUTANEOUS | Status: AC
  Filled 2013-02-08: qty 0.8

## 2013-02-08 MED ORDER — WARFARIN SODIUM 7.5 MG PO TABS
7.5000 mg | ORAL_TABLET | Freq: Once | ORAL | Status: AC
  Administered 2013-02-08: 7.5 mg via ORAL
  Filled 2013-02-08: qty 1

## 2013-02-08 MED ORDER — DIPHENHYDRAMINE HCL 25 MG PO CAPS
50.0000 mg | ORAL_CAPSULE | Freq: Once | ORAL | Status: AC
  Administered 2013-02-08: 50 mg via ORAL
  Filled 2013-02-08: qty 2

## 2013-02-08 MED ORDER — PANTOPRAZOLE SODIUM 40 MG PO TBEC
40.0000 mg | DELAYED_RELEASE_TABLET | Freq: Every day | ORAL | Status: DC
  Administered 2013-02-08 – 2013-02-11 (×4): 40 mg via ORAL
  Filled 2013-02-08 (×4): qty 1

## 2013-02-08 MED ORDER — ENOXAPARIN SODIUM 120 MG/0.8ML ~~LOC~~ SOLN
1.0000 mg/kg | Freq: Two times a day (BID) | SUBCUTANEOUS | Status: DC
  Administered 2013-02-08 – 2013-02-09 (×5): 105 mg via SUBCUTANEOUS
  Filled 2013-02-08 (×6): qty 0.8

## 2013-02-08 MED ORDER — TRAZODONE HCL 50 MG PO TABS
50.0000 mg | ORAL_TABLET | Freq: Every evening | ORAL | Status: DC | PRN
  Administered 2013-02-08 – 2013-02-09 (×3): 50 mg via ORAL
  Filled 2013-02-08 (×2): qty 1

## 2013-02-08 MED ORDER — DOCUSATE SODIUM 100 MG PO CAPS
100.0000 mg | ORAL_CAPSULE | Freq: Two times a day (BID) | ORAL | Status: DC
  Administered 2013-02-08 – 2013-02-11 (×6): 100 mg via ORAL
  Filled 2013-02-08 (×6): qty 1

## 2013-02-08 MED ORDER — ASPIRIN 325 MG PO TABS
325.0000 mg | ORAL_TABLET | Freq: Once | ORAL | Status: AC
  Administered 2013-02-08: 325 mg via ORAL
  Filled 2013-02-08: qty 1

## 2013-02-08 MED ORDER — FUROSEMIDE 10 MG/ML IJ SOLN
40.0000 mg | INTRAMUSCULAR | Status: AC
  Administered 2013-02-08: 40 mg via INTRAVENOUS
  Filled 2013-02-08: qty 4

## 2013-02-08 NOTE — Progress Notes (Signed)
OT Cancellation Note  Patient Details Name: [name] MRN: [id number] DOB: Nov 24, 1966   Cancelled Treatment:    Reason Eval/Treat Not Completed: Other (comment) (Pt sick to stomach and with nursing at this time. ) Will re-attempt eval at a later time when patient is feeling better.  Limmie Patricia, OTR/L  02/08/2013, 8:26 AM

## 2013-02-08 NOTE — Progress Notes (Addendum)
Received troponin notification.  Paged Dr. Rito Ehrlich and received new orders for medications. Patient currently stable with no new complaints.

## 2013-02-08 NOTE — Progress Notes (Signed)
Patient skin dry and intact.  Patient reports she has places in head that she would like someone to look at it.  Patient reported that she has had those places in her head for the past three years, but they aren't healing.  Patient has one area on right anterior scalp, and left lateral scalp that is bleeding from where patient scratched it.

## 2013-02-08 NOTE — Progress Notes (Signed)
ANTICOAGULATION CONSULT NOTE   Pharmacy Consult for Coumadin Indication: AFIB   Allergies  Allergen Reactions  . Doxycycline     itch  . Nitroglycerin Other (See Comments)    CAUSED NUMBNESS ALL OVER  . Vesicare (Solifenacin)     Makes my sweat turn yellow   Patient Measurements: Height: 5\' 6"  (167.6 cm) Weight: 232 lb 14.4 oz (105.643 kg) IBW/kg (Calculated) : 59.3  Vital Signs: Temp: 97.4 F (36.3 C) (03/05 0504) Temp src: Oral (03/05 0504) BP: 137/89 mmHg (03/05 0504) Pulse Rate: 76 (03/05 0504)  Labs:  Recent Labs  02/07/13 2011 02/07/13 2303 02/08/13 0508 02/08/13 1052  HGB 12.8  --  12.8  --   HCT 39.0  --  39.4  --   PLT 251  --  250  --   LABPROT 15.5*  --   --  15.1  INR 1.25  --   --  1.21  CREATININE 0.72  --  0.65  --   TROPONINI  --  0.43* 0.39* 0.38*    Estimated Creatinine Clearance: 105.6 ml/min (by C-G formula based on Cr of 0.65).   Medical History: Past Medical History  Diagnosis Date  . Pacemaker     vent paced  . COPD (chronic obstructive pulmonary disease)   . CHF (congestive heart failure)   . Thyroid disease   . Hepatitis     Hep C  . MI (myocardial infarction)   . Pneumonia   . Dysrhythmia     atrial fibrilation  . Cirrhosis   . Shortness of breath   . Diabetes mellitus   . Chest pain   . GERD (gastroesophageal reflux disease)   . Headache   . Anxiety   . A-fib     Medications:  Scheduled:  . [COMPLETED] sodium chloride   Intravenous Once  . [COMPLETED] albuterol  5 mg Nebulization Once  . [COMPLETED] aspirin  325 mg Oral Once  . atenolol  50 mg Oral BID  . atorvastatin  20 mg Oral q1800  . budesonide-formoterol  2 puff Inhalation BID  . ceFEPime (MAXIPIME) IV  1 g Intravenous Q8H  . enoxaparin (LOVENOX) injection  1 mg/kg Subcutaneous Q12H  . [COMPLETED] furosemide  40 mg Intravenous NOW  . furosemide  160 mg Oral Daily  . guaiFENesin  600 mg Oral BID  . insulin aspart  0-15 Units Subcutaneous TID WC  .  [COMPLETED] ipratropium  0.5 mg Nebulization Once  . levofloxacin  750 mg Oral Daily  . [COMPLETED] oxyCODONE-acetaminophen  1 tablet Oral Once  . [COMPLETED] oxyCODONE-acetaminophen  1 tablet Oral Once  . [COMPLETED] predniSONE      . [COMPLETED] predniSONE  60 mg Oral Once  . sodium chloride  3 mL Intravenous Q12H  . sodium chloride  3 mL Intravenous Q12H  . tiotropium  18 mcg Inhalation Daily  . vancomycin  1,000 mg Intravenous Q8H  . [COMPLETED] warfarin  4 mg Oral Once  . warfarin  7.5 mg Oral ONCE-1800  . Warfarin - Pharmacist Dosing Inpatient   Does not apply q1800  . [DISCONTINUED] ceFEPime (MAXIPIME) IV  1 g Intravenous Q8H  . [DISCONTINUED] levofloxacin (LEVAQUIN) IV  750 mg Intravenous Q24H  . [DISCONTINUED] vancomycin  1,000 mg Intravenous Q8H   Assessment: 47yo female on chronic Coumadin for afib.  INR is below goal.  Goal of Therapy:  INR 2-3 Monitor platelets by anticoagulation protocol: Yes   Plan:  Coumadin 7.5mg  today x 1 to boost INR  INR/PT daily Monitor CBC for platelets Labs per protocol  02/08/2013,12:57 PM

## 2013-02-08 NOTE — Progress Notes (Signed)
Paged Dr. Rito Ehrlich of patient's troponin being .39.  No new orders received.

## 2013-02-08 NOTE — Evaluation (Signed)
Physical Therapy Evaluation Patient Details Name: Kim [name] MRN: [id number] DOB: January 31, 1966 Today's Date: 02/08/2013 Time: 8295-6213 PT Time Calculation (min): 24 min  PT Assessment / Plan / Recommendation Clinical Impression       PT Assessment  Patent does not need any further PT services    Follow Up Recommendations  No PT follow up    Does the patient have the potential to tolerate intense rehabilitation    no  Barriers to Discharge  none      Equipment Recommendations    none_pt needs to get her current walker fixed.  A walker was already given to her via [facility]   Recommendations for Other Services   none  Frequency   none   Precautions / Restrictions Precautions Precautions: None Restrictions Weight Bearing Restrictions: No   Pertinent Vitals/Pain 10/10 and pt able to carry on normal conversation and ambulate with therapist for 200 feet.     Mobility  Bed Mobility Bed Mobility: Supine to Sit Supine to Sit: 7: Independent Transfers Transfers: Sit to Stand Sit to Stand: 7: Independent Ambulation/Gait Ambulation/Gait Assistance: 6: Modified independent (Device/Increase time) Ambulation Distance (Feet): 200 Feet Assistive device: Rolling walker Gait Pattern: Within Functional Limits Gait velocity: normal Stairs: No    Exercises  none   PT Diagnosis:   none PT Problem List:  none PT Treatment Interventions:   none  PT Goals  N/A  Visit Information  Last PT Received On: 02/08/13    Subjective Data  Subjective: Pt states she was given a walker by [facility] but it has fallen apart.  Urged pt to get walker fixed.   Prior Functioning  Home Living Lives With: Family Type of Home: House Home Access: Stairs to enter Secretary/administrator of Steps: 3 Entrance Stairs-Rails: Right Home Layout: One level Home Adaptive Equipment: Walker - rolling (pt states it has come apart ) Prior Function Level of Independence: Independent with assistive device(s) Able to  Take Stairs?: Yes Vocation: Unemployed Communication Communication: No difficulties Dominant Hand: Right    Cognition  Cognition Overall Cognitive Status: Appears within functional limits for tasks assessed/performed Arousal/Alertness: Awake/alert Behavior During Session: [facility] for tasks performed    Extremity/Trunk Assessment Right Lower Extremity Assessment RLE ROM/Strength/Tone: [facility] for tasks assessed Left Lower Extremity Assessment LLE ROM/Strength/Tone: [facility] for tasks assessed       End of Session PT - End of Session Equipment Utilized During Treatment: Gait belt  GP     RUSSELL,CINDY 02/08/2013, 10:36 AM

## 2013-02-08 NOTE — Progress Notes (Addendum)
[name] PROGRESS NOTE  Kim [name] ZOX:[id number] DOB: [birth year] DOA: 02/07/2013 PCP: Provider Not In System  Assessment/Plan: Principal Problem:   HCAP (healthcare-associated pneumonia): Continue IV antibiotics to cover for broad-spectrum. Still trying to get records from [facility]. This will aid [place] in terms of her pleural effusion. No need to tap at this point. Active Problems:   COPD (chronic obstructive pulmonary disease)   Chest pain: Musculoskeletal. Patient's had previous extensive workup for this.   Atrial fibrillation: Coumadin as per pharmacy. INR is subtherapeutic, suspect she may be noncompliant   Coronary atherosclerosis of native coronary artery   Acute on Chronic systolic CHF (congestive heart failure): Mildly elevated BNP. Granted some of it could be from atrial fibrillation, but her morbid obesity also be made making it lower than it is. Starting on 160 mg of by mouth Lasix daily we'll continue. Given extra dose of Lasix this morning. Checking daily weights and strict input/output.   Pacemaker   Cirrhosis of liver due to hepatitis C: Stable. Normal LFTs. Elevated blood sugars: A1c noted to be 5.9. Patient does not have diabetes. Morbid obesity   Code Status: Full code  Family Communication: Plan discussed with patient at the bedside.  Disposition Plan: Home with home health in a few days   Consultants:  None  Procedures:  None  Antibiotics:  Started 02/07/13: IV vancomycin, cefepime, Levaquin-day 2  HPI/Subjective: Patient doing okay. States her breathing is a little bit better. No chest pain. Still feels overall quite weak.  Objective: Filed Vitals:   02/08/13 0504 02/08/13 0821 02/08/13 1118 02/08/13 1333  BP: 137/89   131/85  Pulse: 76   80  Temp: 97.4 F (36.3 C)   97.3 F (36.3 C)  TempSrc: Oral     Resp: 20   20  Height:      Weight:  105.643 kg (232 lb 14.4 oz)    SpO2: 93%  94% 98%    Intake/Output Summary (Last 24  hours) at 02/08/13 1626 Last data filed at 02/08/13 1428  Gross per 24 hour  Intake    770 ml  Output   1200 ml  Net   -430 ml   Filed Weights   02/07/13 1951 02/07/13 2311 02/08/13 0821  Weight: 104.781 kg (231 lb) 105.053 kg (231 lb 9.6 oz) 105.643 kg (232 lb 14.4 oz)    Exam:   General:  Alert and oriented x3, mild distress secondary from breathing  Cardiovascular: Irregular rhythm, rate controlled  Respiratory: Bilateral mild wheezing with rails, decreased breath sounds at the left base  Abdomen: Soft, morbidly obese, nontender, hypoactive bowel sounds  Musculoskeletal: No clubbing or cyanosis, 1+ pitting edema bilaterally from the knees down  Data Reviewed: Basic Metabolic Panel:  Recent Labs Lab 02/07/13 2011 02/08/13 0508  NA 139 138  K 3.7 4.7  CL 98 101  CO2 31 27  GLUCOSE 115* 132*  BUN 5* 6  CREATININE 0.72 0.65  CALCIUM 9.1 9.2   Liver Function Tests:  Recent Labs Lab 02/07/13 2011 02/08/13 0508  AST 31 28  ALT 21 21  ALKPHOS 67 67  BILITOT 0.9 1.0  PROT 7.4 7.8  ALBUMIN 3.6 3.6   CBC:  Recent Labs Lab 02/07/13 2011 02/08/13 0508  WBC 7.7 6.4  NEUTROABS 4.6  --   HGB 12.8 12.8  HCT 39.0 39.4  MCV 96.5 96.1  PLT 251 250   Cardiac Enzymes:  Recent Labs Lab 02/07/13 2303 02/08/13 0508 02/08/13 1052  TROPONINI 0.43* 0.39* 0.38*   BNP (last 3 results)  Recent Labs  05/31/12 0112 07/20/12 0416 02/07/13 2011  PROBNP 3874.0* 3429.0* 1566.0*   CBG:  Recent Labs Lab 02/08/13 0743 02/08/13 1134  GLUCAP 178* 95      Studies: Dg Chest 2 View  02/07/2013    IMPRESSION:  1.  Right pleural effusion and basilar airspace disease worrisome for pneumonia.  Recommend follow-up to clearing. 2.  Cardiomegaly without edema.   Original Report Authenticated By: Holley Dexter, M.D.     Scheduled Meds: . atenolol  50 mg Oral BID  . atorvastatin  20 mg Oral q1800  . budesonide-formoterol  2 puff Inhalation BID  . ceFEPime  (MAXIPIME) IV  1 g Intravenous Q8H  . enoxaparin (LOVENOX) injection  1 mg/kg Subcutaneous Q12H  . furosemide  160 mg Oral Daily  . guaiFENesin  600 mg Oral BID  . insulin aspart  0-15 Units Subcutaneous TID WC  . levofloxacin  750 mg Oral Daily  . sodium chloride  3 mL Intravenous Q12H  . sodium chloride  3 mL Intravenous Q12H  . tiotropium  18 mcg Inhalation Daily  . vancomycin  1,000 mg Intravenous Q8H  . warfarin  7.5 mg Oral ONCE-1800  . Warfarin - Pharmacist Dosing Inpatient   Does not apply q1800   Continuous Infusions:   Principal Problem:   HCAP (healthcare-associated pneumonia) Active Problems:   COPD (chronic obstructive pulmonary disease)   Chest pain   Atrial fibrillation   Coronary atherosclerosis of native coronary artery   Chronic systolic CHF (congestive heart failure)   Pacemaker   Cirrhosis of liver due to hepatitis C    Time spent: 25 minutes   Hollice Espy  [name] Pager [phone] If 7PM-7AM, please contact night-coverage at [email or web address], password [facility] 02/08/2013, 4:26 PM  LOS: 1 day

## 2013-02-08 NOTE — Care Management Note (Unsigned)
    Page 1 of 1   02/08/2013     9:43:47 AM   CARE MANAGEMENT NOTE 02/08/2013  Patient:  Kim [name], Kim [name]   Account Number:  [id number]  Date Initiated:  02/08/2013  Documentation initiated by:  Rosemary Holms  Subjective/Objective Assessment:   Pt admitted from home where she lives with her son who is in his 50's. States she has [facility] RN who come about 2xa a week. assists with drawing blood. Sees MD in [place].     Action/Plan:   Anticipated DC Date:  02/09/2013   Anticipated DC Plan:  HOME W HOME HEALTH SERVICES      DC Planning Services  CM consult      Choice offered to / List presented to:             Status of service:  In process, will continue to follow Medicare Important Message given?   (If response is "NO", the following Medicare IM given date fields will be blank) Date Medicare IM given:   Date Additional Medicare IM given:    Discharge Disposition:    Per UR Regulation:    If discussed at Long Length of Stay Meetings, dates discussed:    Comments:  02/08/13 Rosemary Holms RN BSN CM

## 2013-02-08 NOTE — Progress Notes (Signed)
UR Chart Review Completed  

## 2013-02-09 LAB — BASIC METABOLIC PANEL
CO2: 34 mEq/L — ABNORMAL HIGH (ref 19–32)
Calcium: 9 mg/dL (ref 8.4–10.5)
Chloride: 93 mEq/L — ABNORMAL LOW (ref 96–112)
GFR calc Af Amer: 84 mL/min — ABNORMAL LOW (ref >90)
Sodium: 139 mEq/L (ref 135–145)

## 2013-02-09 LAB — PROTIME-INR
INR: 1.54 — ABNORMAL HIGH (ref 0.00–1.49)
Prothrombin Time: 18 seconds — ABNORMAL HIGH (ref 11.6–15.2)

## 2013-02-09 LAB — GLUCOSE, CAPILLARY
Glucose-Capillary: 160 mg/dL — ABNORMAL HIGH (ref 70–99)
Glucose-Capillary: 117 mg/dL — ABNORMAL HIGH (ref 70–99)
Glucose-Capillary: 73 mg/dL (ref 70–99)
Glucose-Capillary: 99 mg/dL (ref 70–99)
Glucose-Capillary: 106 mg/dL — ABNORMAL HIGH (ref 70–99)
Glucose-Capillary: 159 mg/dL — ABNORMAL HIGH (ref 70–99)

## 2013-02-09 LAB — LEGIONELLA ANTIGEN, URINE: Legionella Antigen, Urine: NEGATIVE

## 2013-02-09 LAB — PRO B NATRIURETIC PEPTIDE: Pro B Natriuretic peptide (BNP): 1588 pg/mL — ABNORMAL HIGH (ref 0–125)

## 2013-02-09 MED ORDER — VANCOMYCIN HCL IN DEXTROSE 1-5 GM/200ML-% IV SOLN
1000.0000 mg | Freq: Two times a day (BID) | INTRAVENOUS | Status: DC
  Administered 2013-02-09 – 2013-02-11 (×5): 1000 mg via INTRAVENOUS
  Filled 2013-02-09 (×9): qty 200

## 2013-02-09 MED ORDER — POTASSIUM CHLORIDE CRYS ER 20 MEQ PO TBCR
40.0000 meq | EXTENDED_RELEASE_TABLET | Freq: Once | ORAL | Status: AC
  Administered 2013-02-09: 40 meq via ORAL
  Filled 2013-02-09: qty 2

## 2013-02-09 MED ORDER — LEVALBUTEROL HCL 0.63 MG/3ML IN NEBU
0.6300 mg | INHALATION_SOLUTION | Freq: Four times a day (QID) | RESPIRATORY_TRACT | Status: DC | PRN
Start: 2013-02-09 — End: 2013-02-11
  Administered 2013-02-09: 0.63 mg via RESPIRATORY_TRACT
  Filled 2013-02-09 (×2): qty 3

## 2013-02-09 MED ORDER — PREDNISONE 20 MG PO TABS
50.0000 mg | ORAL_TABLET | Freq: Every day | ORAL | Status: DC
  Administered 2013-02-09 – 2013-02-11 (×3): 50 mg via ORAL
  Filled 2013-02-09: qty 2
  Filled 2013-02-09: qty 1
  Filled 2013-02-09: qty 2

## 2013-02-09 NOTE — Progress Notes (Signed)
ANTIBIOTIC CONSULT NOTE   Pharmacy Consult for Vancomycin & renal adjustment antibiotics Indication: pneumonia  Allergies  Allergen Reactions  . Doxycycline     itch  . Nitroglycerin Other (See Comments)    CAUSED NUMBNESS ALL OVER  . Vesicare (Solifenacin)     Makes my sweat turn yellow   Patient Measurements: Height: 5\' 6"  (167.6 cm) Weight: 227 lb 11.2 oz (103.284 kg) IBW/kg (Calculated) : 59.3   Vital Signs: Temp: 98.2 F (36.8 C) (03/06 0543) Temp src: Oral (03/05 2116) BP: 106/72 mmHg (03/06 0543) Pulse Rate: 75 (03/06 0543) Intake/Output from previous day: 03/05 0701 - 03/06 0700 In: 1090 [P.O.:840; IV Piggyback:250] Out: 1700 [Urine:1700] Intake/Output from this shift:    Labs:  Recent Labs  02/07/13 2011 02/08/13 0508 02/09/13 0523  WBC 7.7 6.4  --   HGB 12.8 12.8  --   PLT 251 250  --   CREATININE 0.72 0.65 0.92   Estimated Creatinine Clearance: 90.8 ml/min (by C-G formula based on Cr of 0.92).  Recent Labs  02/09/13 0523  VANCOTROUGH 23.5*    Microbiology: No results found for this or any previous visit (from the past 720 hour(s)).  Medical History: Past Medical History  Diagnosis Date  . Pacemaker     vent paced  . COPD (chronic obstructive pulmonary disease)   . CHF (congestive heart failure)   . Thyroid disease   . Hepatitis     Hep C  . MI (myocardial infarction)   . Pneumonia   . Dysrhythmia     atrial fibrilation  . Cirrhosis   . Shortness of breath   . Diabetes mellitus   . Chest pain   . GERD (gastroesophageal reflux disease)   . Headache   . Anxiety   . A-fib    Medications:  Scheduled:  . atenolol  50 mg Oral BID  . atorvastatin  20 mg Oral q1800  . budesonide-formoterol  2 puff Inhalation BID  . ceFEPime (MAXIPIME) IV  1 g Intravenous Q8H  . [COMPLETED] diphenhydrAMINE  50 mg Oral Once  . docusate sodium  100 mg Oral BID  . enoxaparin (LOVENOX) injection  1 mg/kg Subcutaneous Q12H  . [COMPLETED] furosemide   40 mg Intravenous NOW  . furosemide  160 mg Oral Daily  . guaiFENesin  600 mg Oral BID  . insulin aspart  0-15 Units Subcutaneous TID WC  . levofloxacin  750 mg Oral Daily  . pantoprazole  40 mg Oral Daily  . sodium chloride  3 mL Intravenous Q12H  . sodium chloride  3 mL Intravenous Q12H  . tiotropium  18 mcg Inhalation Daily  . vancomycin  1,000 mg Intravenous Q12H  . [COMPLETED] warfarin  7.5 mg Oral ONCE-1800  . Warfarin - Pharmacist Dosing Inpatient   Does not apply q1800  . [DISCONTINUED] vancomycin  1,000 mg Intravenous Q8H   Assessment Trough level is above goal.  Pt has good renal fxn.  Estimated Creatinine Clearance: 90.8 ml/min (by C-G formula based on Cr of 0.92).   Will adjust frequency to q12hrs.  Pt is afebrile.  Goal of Therapy:  Vancomycin trough level 15-20 mcg/ml  Plan:  Adjust Vancomycin to 1 GM IV every 12 hrs Vancomycin trough weekly while on Vanco Continue Cefepime IV and Levaquin PO Duration of therapy per MD (anticipate 8 days) De-escalate antibiotics as appropriate Labs per protocol  Valrie Hart A 02/09/2013,8:14 AM

## 2013-02-09 NOTE — Progress Notes (Signed)
[name] PROGRESS NOTE  Kim [name]:[id number] DOB: March 29, 1966 DOA: 02/07/2013 PCP: [facility] in [place]  Assessment/Plan: Principal Problem:   HCAP (healthcare-associated pneumonia): Continue IV antibiotics to cover for broad-spectrum. Still trying to get records from [facility]. Spoke with someone in medical records initially they will sent over today. This will aid [place] in terms of her pleural effusion. No need to tap at this point. Active Problems:   COPD (chronic obstructive pulmonary disease): Slightly more wheezy. Change albuterol inhaler to Xopenex nebs plus added quick prednisone by mouth taper.    Chest pain: Musculoskeletal. Patient's had previous extensive workup in the past for this.    Atrial fibrillation: Coumadin as per pharmacy. INR is subtherapeutic, suspect she may be noncompliant. INR starting to trend up.    Coronary atherosclerosis of native coronary artery: Stable. Minimally elevated troponins more attributed to respiratory illness in atrial fibrillation and volume overload.    Acute on Chronic systolic CHF (congestive heart failure): Mildly elevated BNP. Granted some of it could be from atrial fibrillation, but her morbid obesity also could be making it lower than it is. Starting on 160 mg of by mouth Lasix daily we'll continue. Given extra dose of Lasix this morning. Lasix started prior to reporting an input/output. Patient is down 4 pounds. Would stick with home dose now of Lasix and check morning BNP. Noted chloride starting to trend up.    Pacemaker    Cirrhosis of liver due to hepatitis C: Stable. Normal LFTs.  Elevated blood sugars: A1c noted to be 5.9. Patient does not have diabetes.  Morbid obesity  Code Status: Full code  Family Communication: Plan discussed with patient at the bedside.  Disposition Plan: Home with home health in a few days   Consultants:  None  Procedures:  None  Antibiotics:  Started 02/07/13: IV  vancomycin, cefepime, Levaquin-day 3  HPI/Subjective: Patient doing okay. Her breathing is little bit easier, although she notes she is wheezing. No complaints of pain. She does feel very weak. She had a" fall" in the bathroom. She says that she felt weak and slightly lightheaded. Next tach who monitored her noted the patient actually sat down on the floor.  Objective: Filed Vitals:   02/08/13 2116 02/09/13 0543 02/09/13 0720 02/09/13 0907  BP: 121/83 106/72  117/79  Pulse: 75 75  72  Temp: 97.7 F (36.5 C) 98.2 F (36.8 C)  98.2 F (36.8 C)  TempSrc: Oral     Resp: 20 20  20   Height:      Weight:  103.284 kg (227 lb 11.2 oz)    SpO2: 96% 93% 99% 98%    Intake/Output Summary (Last 24 hours) at 02/09/13 1017 Last data filed at 02/09/13 0900  Gross per 24 hour  Intake   1090 ml  Output   1100 ml  Net    -10 ml   Filed Weights   02/07/13 2311 02/08/13 0821 02/09/13 0543  Weight: 105.053 kg (231 lb 9.6 oz) 105.643 kg (232 lb 14.4 oz) 103.284 kg (227 lb 11.2 oz)    Exam:   General:  Alert and oriented x3, mild distress secondary from breathing  Cardiovascular: Irregular rhythm, rate controlled  Respiratory: Bilateral end expiratory wheeze throughout. Decreased breath sounds throughout, More so at left base   Abdomen: Soft, morbidly obese, nontender, hypoactive bowel sounds  Musculoskeletal: No clubbing or cyanosis, 1+ pitting edema bilaterally from the knees down  Data Reviewed: Basic Metabolic Panel:  Recent Labs Lab  02/07/13 2011 02/08/13 0508 02/09/13 0523  NA 139 138 139  K 3.7 4.7 3.1*  CL 98 101 93*  CO2 31 27 34*  GLUCOSE 115* 132* 93  BUN 5* 6 9  CREATININE 0.72 0.65 0.92  CALCIUM 9.1 9.2 9.0   Liver Function Tests:  Recent Labs Lab 02/07/13 2011 02/08/13 0508  AST 31 28  ALT 21 21  ALKPHOS 67 67  BILITOT 0.9 1.0  PROT 7.4 7.8  ALBUMIN 3.6 3.6   CBC:  Recent Labs Lab 02/07/13 2011 02/08/13 0508  WBC 7.7 6.4  NEUTROABS 4.6  --    HGB 12.8 12.8  HCT 39.0 39.4  MCV 96.5 96.1  PLT 251 250   Cardiac Enzymes:  Recent Labs Lab 02/07/13 2303 02/08/13 0508 02/08/13 1052  TROPONINI 0.43* 0.39* 0.38*   BNP (last 3 results)  Recent Labs  07/20/12 0416 02/07/13 2011 02/09/13 0523  PROBNP 3429.0* 1566.0* 1588.0*   CBG:  Recent Labs Lab 02/08/13 1134 02/08/13 1637 02/08/13 2115 02/09/13 0736 02/09/13 0916  GLUCAP 95 102* 106* 160* 117*      Studies: Dg Chest 2 View  02/07/2013    IMPRESSION:  1.  Right pleural effusion and basilar airspace disease worrisome for pneumonia.  Recommend follow-up to clearing. 2.  Cardiomegaly without edema.   Original Report Authenticated By: Holley Dexter, M.D.     Scheduled Meds: . atenolol  50 mg Oral BID  . atorvastatin  20 mg Oral q1800  . budesonide-formoterol  2 puff Inhalation BID  . ceFEPime (MAXIPIME) IV  1 g Intravenous Q8H  . docusate sodium  100 mg Oral BID  . enoxaparin (LOVENOX) injection  1 mg/kg Subcutaneous Q12H  . furosemide  160 mg Oral Daily  . guaiFENesin  600 mg Oral BID  . insulin aspart  0-15 Units Subcutaneous TID WC  . levofloxacin  750 mg Oral Daily  . pantoprazole  40 mg Oral Daily  . potassium chloride  40 mEq Oral Once  . predniSONE  50 mg Oral Q breakfast  . sodium chloride  3 mL Intravenous Q12H  . sodium chloride  3 mL Intravenous Q12H  . tiotropium  18 mcg Inhalation Daily  . vancomycin  1,000 mg Intravenous Q12H  . Warfarin - Pharmacist Dosing Inpatient   Does not apply q1800   Continuous Infusions:   Principal Problem:   HCAP (healthcare-associated pneumonia) Active Problems:   COPD (chronic obstructive pulmonary disease)   Chest pain   Atrial fibrillation   Coronary atherosclerosis of native coronary artery   Chronic systolic CHF (congestive heart failure)   Pacemaker   Cirrhosis of liver due to hepatitis C     time spent: 35  minutes   Hollice Espy  [name] Pager [phone] If 7PM-7AM,  please contact night-coverage at [email or web address], password [facility] 02/09/2013, 10:17 AM  LOS: 2 days

## 2013-02-09 NOTE — Progress Notes (Addendum)
ANTICOAGULATION CONSULT NOTE   Pharmacy Consult for Coumadin Indication: AFIB  Allergies  Allergen Reactions  . Doxycycline     itch  . Nitroglycerin Other (See Comments)    CAUSED NUMBNESS ALL OVER  . Vesicare (Solifenacin)     Makes my sweat turn yellow   Patient Measurements: Height: 5\' 6"  (167.6 cm) Weight: 227 lb 11.2 oz (103.284 kg) IBW/kg (Calculated) : 59.3  Vital Signs: Temp: 98.2 F (36.8 C) (03/06 0543) Temp src: Oral (03/05 2116) BP: 106/72 mmHg (03/06 0543) Pulse Rate: 75 (03/06 0543)  Labs:  Recent Labs  02/07/13 2011 02/07/13 2303 02/08/13 0508 02/08/13 1052 02/09/13 0523  HGB 12.8  --  12.8  --   --   HCT 39.0  --  39.4  --   --   PLT 251  --  250  --   --   LABPROT 15.5*  --   --  15.1 18.0*  INR 1.25  --   --  1.21 1.54*  CREATININE 0.72  --  0.65  --  0.92  TROPONINI  --  0.43* 0.39* 0.38*  --    Estimated Creatinine Clearance: 90.8 ml/min (by C-G formula based on Cr of 0.92).  Medical History: Past Medical History  Diagnosis Date  . Pacemaker     vent paced  . COPD (chronic obstructive pulmonary disease)   . CHF (congestive heart failure)   . Thyroid disease   . Hepatitis     Hep C  . MI (myocardial infarction)   . Pneumonia   . Dysrhythmia     atrial fibrilation  . Cirrhosis   . Shortness of breath   . Diabetes mellitus   . Chest pain   . GERD (gastroesophageal reflux disease)   . Headache   . Anxiety   . A-fib    Medications:  Scheduled:  . atenolol  50 mg Oral BID  . atorvastatin  20 mg Oral q1800  . budesonide-formoterol  2 puff Inhalation BID  . ceFEPime (MAXIPIME) IV  1 g Intravenous Q8H  . [COMPLETED] diphenhydrAMINE  50 mg Oral Once  . docusate sodium  100 mg Oral BID  . enoxaparin (LOVENOX) injection  1 mg/kg Subcutaneous Q12H  . furosemide  160 mg Oral Daily  . guaiFENesin  600 mg Oral BID  . insulin aspart  0-15 Units Subcutaneous TID WC  . levofloxacin  750 mg Oral Daily  . pantoprazole  40 mg Oral Daily   . potassium chloride  40 mEq Oral Once  . sodium chloride  3 mL Intravenous Q12H  . sodium chloride  3 mL Intravenous Q12H  . tiotropium  18 mcg Inhalation Daily  . vancomycin  1,000 mg Intravenous Q12H  . [COMPLETED] warfarin  7.5 mg Oral ONCE-1800  . Warfarin - Pharmacist Dosing Inpatient   Does not apply q1800  . [DISCONTINUED] vancomycin  1,000 mg Intravenous Q8H   Assessment: 47yo female on chronic Coumadin for afib.  INR is below goal. Pt is on full dose Lovenox until INR > 2.   Home dose reportedly 4mg  daily.  Anticipate home regimen may need to be adjusted to maintain INR at therapeutic range.  Goal of Therapy:  INR 2-3 Monitor platelets by anticoagulation protocol: Yes   Plan:  Repeat Coumadin 7.5mg  today x 1 to boost INR INR/PT daily Continue Lovenox 1mg /Kg SQ q12hrs until INR > 2 Monitor CBC for platelets Labs per protocol  02/09/2013,8:38 AM

## 2013-02-09 NOTE — Progress Notes (Signed)
Occupational Therapy Screen  OT orders received. Patient's chart reviewed. Spoke with patient who states that she is independent with assistive devices at home. Patient normally needs assist to get in and out of the tub. Patient uses a walker at home. Patient owns a shower chair. Patient is at baseline and does not require OT services at this time; will sign off.  Kim [name], OTR/L 02/09/13 11:49 AM

## 2013-02-09 NOTE — Progress Notes (Signed)
NT entered room and found patient sitting crossed-legged in floor beside of bed.  Patient stated that she had been to the bathroom and went to sit on the floor and missed and slid onto the floor.  Patient has no bruises or injuries noted.  Patient states of pain in left arm, but states that this is chronic.  Denies hitting her head.  States that she feels fine.  Patient returned to bed.  Bed alarm on.  Instructed patient to call for help before trying to get out of the bed. Red socks placed on patient.  Dr. Rito Ehrlich on unit and notified.  He will be in to see the patient.

## 2013-02-09 NOTE — Progress Notes (Signed)
Dr. Rito Ehrlich called and stated to continue to monitor the patient.  No new orders at this time.

## 2013-02-09 NOTE — Progress Notes (Signed)
Pt had a 5 beat run of VTach.  Dr. Rito Ehrlich notified via text page.

## 2013-02-10 LAB — GLUCOSE, CAPILLARY
Glucose-Capillary: 120 mg/dL — ABNORMAL HIGH (ref 70–99)
Glucose-Capillary: 140 mg/dL — ABNORMAL HIGH (ref 70–99)
Glucose-Capillary: 142 mg/dL — ABNORMAL HIGH (ref 70–99)
Glucose-Capillary: 138 mg/dL — ABNORMAL HIGH (ref 70–99)

## 2013-02-10 LAB — CBC
HCT: 37.6 % (ref 36.0–46.0)
Hemoglobin: 12.6 g/dL (ref 12.0–15.0)
MCHC: 33.5 g/dL (ref 30.0–36.0)
RBC: 3.98 MIL/uL (ref 3.87–5.11)
WBC: 11.8 10*3/uL — ABNORMAL HIGH (ref 4.0–10.5)

## 2013-02-10 LAB — BASIC METABOLIC PANEL
BUN: 14 mg/dL (ref 6–23)
CO2: 33 mEq/L — ABNORMAL HIGH (ref 19–32)
Calcium: 8.2 mg/dL — ABNORMAL LOW (ref 8.4–10.5)
Creatinine, Ser: 0.97 mg/dL (ref 0.50–1.10)
GFR calc non Af Amer: 68 mL/min — ABNORMAL LOW (ref >90)
Glucose, Bld: 128 mg/dL — ABNORMAL HIGH (ref 70–99)

## 2013-02-10 LAB — PRO B NATRIURETIC PEPTIDE: Pro B Natriuretic peptide (BNP): 1325 pg/mL — ABNORMAL HIGH (ref 0–125)

## 2013-02-10 MED ORDER — WARFARIN SODIUM 7.5 MG PO TABS
7.5000 mg | ORAL_TABLET | Freq: Once | ORAL | Status: AC
  Administered 2013-02-10: 7.5 mg via ORAL
  Filled 2013-02-10: qty 1

## 2013-02-10 MED ORDER — SORBITOL 70 % SOLN
30.0000 mL | Freq: Every day | Status: DC | PRN
  Filled 2013-02-10: qty 30

## 2013-02-10 MED ORDER — FLEET ENEMA 7-19 GM/118ML RE ENEM: 1.0000 | ENEMA | Freq: Every day | RECTAL | Status: DC | PRN

## 2013-02-10 MED ORDER — ENOXAPARIN SODIUM 100 MG/ML ~~LOC~~ SOLN
100.0000 mg | Freq: Two times a day (BID) | SUBCUTANEOUS | Status: DC
  Administered 2013-02-10 – 2013-02-11 (×3): 100 mg via SUBCUTANEOUS
  Filled 2013-02-10 (×2): qty 1

## 2013-02-10 NOTE — Progress Notes (Signed)
ANTICOAGULATION CONSULT NOTE   Pharmacy Consult for Coumadin Indication: AFIB  Allergies  Allergen Reactions  . Doxycycline     itch  . Nitroglycerin Other (See Comments)    CAUSED NUMBNESS ALL OVER  . Vesicare (Solifenacin)     Makes my sweat turn yellow   Patient Measurements: Height: 5\' 6"  (167.6 cm) Weight: 220 lb (99.791 kg) IBW/kg (Calculated) : 59.3  Vital Signs: Temp: 97 F (36.1 C) (03/07 0544) Temp src: Oral (03/07 0544) BP: 120/82 mmHg (03/07 0544) Pulse Rate: 87 (03/07 0544)  Labs:  Recent Labs  02/07/13 2011 02/07/13 2303 02/08/13 0508 02/08/13 1052 02/09/13 0523 02/10/13 0510  HGB 12.8  --  12.8  --   --  12.6  HCT 39.0  --  39.4  --   --  37.6  PLT 251  --  250  --   --  238  LABPROT 15.5*  --   --  15.1 18.0* 19.5*  INR 1.25  --   --  1.21 1.54* 1.71*  CREATININE 0.72  --  0.65  --  0.92 0.97  TROPONINI  --  0.43* 0.39* 0.38*  --   --    Estimated Creatinine Clearance: 84.5 ml/min (by C-G formula based on Cr of 0.97).  Medical History: Past Medical History  Diagnosis Date  . Pacemaker     vent paced  . COPD (chronic obstructive pulmonary disease)   . CHF (congestive heart failure)   . Thyroid disease   . Hepatitis     Hep C  . MI (myocardial infarction)   . Pneumonia   . Dysrhythmia     atrial fibrilation  . Cirrhosis   . Shortness of breath   . Diabetes mellitus   . Chest pain   . GERD (gastroesophageal reflux disease)   . Headache   . Anxiety   . A-fib    Medications:  Scheduled:  . atenolol  50 mg Oral BID  . atorvastatin  20 mg Oral q1800  . budesonide-formoterol  2 puff Inhalation BID  . ceFEPime (MAXIPIME) IV  1 g Intravenous Q8H  . docusate sodium  100 mg Oral BID  . enoxaparin (LOVENOX) injection  100 mg Subcutaneous Q12H  . furosemide  160 mg Oral Daily  . guaiFENesin  600 mg Oral BID  . insulin aspart  0-15 Units Subcutaneous TID WC  . levofloxacin  750 mg Oral Daily  . pantoprazole  40 mg Oral Daily  .  [COMPLETED] potassium chloride  40 mEq Oral Once  . predniSONE  50 mg Oral Q breakfast  . sodium chloride  3 mL Intravenous Q12H  . sodium chloride  3 mL Intravenous Q12H  . tiotropium  18 mcg Inhalation Daily  . vancomycin  1,000 mg Intravenous Q12H  . warfarin  7.5 mg Oral ONCE-1800  . Warfarin - Pharmacist Dosing Inpatient   Does not apply q1800  . [DISCONTINUED] enoxaparin (LOVENOX) injection  1 mg/kg Subcutaneous Q12H   Assessment: 47yo female on Coumadin 4mg  daily for afib.  INR was sub-therapeutic on admission likely secondary to non-compliance.  It is trending to goal range in hospital. No bleeding noted.  Patient is on full-dose Lovenox.   Goal of Therapy:  INR 2-3 Monitor platelets by anticoagulation protocol: Yes   Plan:  Repeat Coumadin 7.5mg  today x 1  INR/PT daily Continue Lovenox 1mg /Kg SQ q12hrs until INR > 2 Monitor CBC for platelets  Junita Push, PharmD, BCPS 02/10/2013,8:49 AM

## 2013-02-10 NOTE — Progress Notes (Signed)
[name] PROGRESS NOTE  BRIGHID KOCH ZOX:[id number] DOB: 04/15/1966 DOA: 02/07/2013 PCP: [facility] in [place]  Assessment/Plan: Principal Problem:   HCAP (healthcare-associated pneumonia): Continue IV antibiotics to cover for broad-spectrum. Records from [facility] removed.  Active Problems:   Pleural effusion. Patient recently had pleural effusion tapped at [facility]. She is now admitted with a recurrent effusion. This is likely secondary to noncompliance with Lasix and due to her congestive heart failure. She appears to be breathing comfortably at this point. We'll continue Lasix and have repeat chest x-ray next 2-3 weeks. If her pleural effusion is persistent/worsening then thoracocentesis may be considered at that time.    COPD (chronic obstructive pulmonary disease): Patient's wheezing appears to have improved on prednisone taper.    Chest pain: Musculoskeletal. Patient's had previous extensive workup in the past for this.    Atrial fibrillation: Coumadin as per pharmacy. INR is subtherapeutic, suspect she may be noncompliant. INR starting to trend up.    Coronary atherosclerosis of native coronary artery: Stable. Minimally elevated troponins more attributed to respiratory illness in atrial fibrillation and volume overload.    Acute on Chronic systolic CHF (congestive heart failure): Mildly elevated BNP. Granted some of it could be from atrial fibrillation, but her morbid obesity also could be making it lower than it is. Starting on 160 mg of by mouth Lasix daily we'll continue. Given extra dose of Lasix this morning. Lasix started prior to reporting an input/output. Appears to be improving with current dose.    Pacemaker    Cirrhosis of liver due to hepatitis C: Stable. Normal LFTs.  Elevated blood sugars: A1c noted to be 5.9. Patient does not have diabetes.  Morbid obesity  Code Status: Full code  Family Communication: Plan discussed with  patient at the bedside.  Disposition Plan: Possible discharge home tomorrow.   Consultants:  None  Procedures:  None  Antibiotics:  Started 02/07/13: IV vancomycin, cefepime, Levaquin-day 4  HPI/Subjective: Patient feels that her breathing is improving. Her wheezing has also improved. She does not have any chest pain today.  Objective: Filed Vitals:   02/09/13 2054 02/10/13 0544 02/10/13 0724 02/10/13 1400  BP: 116/73 120/82  112/81  Pulse: 78 87  76  Temp: 97.6 F (36.4 C) 97 F (36.1 C)  97.4 F (36.3 C)  TempSrc: Oral Oral  Oral  Resp: 20 20  18   Height:      Weight:  99.791 kg (220 lb)    SpO2: 98% 100% 97% 98%    Intake/Output Summary (Last 24 hours) at 02/10/13 1624 Last data filed at 02/09/13 1758  Gross per 24 hour  Intake    240 ml  Output      0 ml  Net    240 ml   Filed Weights   02/08/13 0821 02/09/13 0543 02/10/13 0544  Weight: 105.643 kg (232 lb 14.4 oz) 103.284 kg (227 lb 11.2 oz) 99.791 kg (220 lb)    Exam:   General:  Alert and oriented x3, no signs of distress  Cardiovascular: Irregular rhythm, rate controlled  Respiratory: Bilateral wheezing has improved. Diminished breath sounds at the right base.   Abdomen: Soft, morbidly obese, nontender, hypoactive bowel sounds  Musculoskeletal: No clubbing or cyanosis, 1+ pitting edema bilaterally from the knees down  Data Reviewed: Basic Metabolic Panel:  Recent Labs Lab 02/07/13 2011 02/08/13 0508 02/09/13 0523 02/10/13 0510  NA 139 138 139 138  K 3.7 4.7 3.1* 3.5  CL 98 101 93*  93*  CO2 31 27 34* 33*  GLUCOSE 115* 132* 93 128*  BUN 5* 6 9 14   CREATININE 0.72 0.65 0.92 0.97  CALCIUM 9.1 9.2 9.0 8.2*   Liver Function Tests:  Recent Labs Lab 02/07/13 2011 02/08/13 0508  AST 31 28  ALT 21 21  ALKPHOS 67 67  BILITOT 0.9 1.0  PROT 7.4 7.8  ALBUMIN 3.6 3.6   CBC:  Recent Labs Lab 02/07/13 2011 02/08/13 0508 02/10/13 0510  WBC 7.7 6.4 11.8*  NEUTROABS 4.6  --   --    HGB 12.8 12.8 12.6  HCT 39.0 39.4 37.6  MCV 96.5 96.1 94.5  PLT 251 250 238   Cardiac Enzymes:  Recent Labs Lab 02/07/13 2303 02/08/13 0508 02/08/13 1052  TROPONINI 0.43* 0.39* 0.38*   BNP (last 3 results)  Recent Labs  02/07/13 2011 02/09/13 0523 02/10/13 0510  PROBNP 1566.0* 1588.0* 1325.0*   CBG:  Recent Labs Lab 02/09/13 1222 02/09/13 1612 02/09/13 2053 02/10/13 0752 02/10/13 1120  GLUCAP 99 106* 159* 120* 140*      Studies: Dg Chest 2 View  02/07/2013    IMPRESSION:  1.  Right pleural effusion and basilar airspace disease worrisome for pneumonia.  Recommend follow-up to clearing. 2.  Cardiomegaly without edema.   Original Report Authenticated By: Holley Dexter, M.D.     Scheduled Meds: . atenolol  50 mg Oral BID  . atorvastatin  20 mg Oral q1800  . budesonide-formoterol  2 puff Inhalation BID  . ceFEPime (MAXIPIME) IV  1 g Intravenous Q8H  . docusate sodium  100 mg Oral BID  . enoxaparin (LOVENOX) injection  100 mg Subcutaneous Q12H  . furosemide  160 mg Oral Daily  . guaiFENesin  600 mg Oral BID  . insulin aspart  0-15 Units Subcutaneous TID WC  . levofloxacin  750 mg Oral Daily  . pantoprazole  40 mg Oral Daily  . predniSONE  50 mg Oral Q breakfast  . sodium chloride  3 mL Intravenous Q12H  . sodium chloride  3 mL Intravenous Q12H  . tiotropium  18 mcg Inhalation Daily  . vancomycin  1,000 mg Intravenous Q12H  . warfarin  7.5 mg Oral ONCE-1800  . Warfarin - Pharmacist Dosing Inpatient   Does not apply q1800   Continuous Infusions:   Principal Problem:   HCAP (healthcare-associated pneumonia) Active Problems:   COPD (chronic obstructive pulmonary disease)   Chest pain   Atrial fibrillation   Coronary atherosclerosis of native coronary artery   Chronic systolic CHF (congestive heart failure)   Pacemaker   Cirrhosis of liver due to hepatitis C     time spent: 30  minutes   MEMON,JEHANZEB  [name] Pager [phone] If  7PM-7AM, please contact night-coverage at [email or web address], password [facility] 02/10/2013, 4:24 PM  LOS: 3 days

## 2013-02-11 ENCOUNTER — Inpatient Hospital Stay (HOSPITAL_COMMUNITY): Payer: Medicaid Other

## 2013-02-11 LAB — BASIC METABOLIC PANEL
BUN: 17 mg/dL (ref 6–23)
CO2: 34 mEq/L — ABNORMAL HIGH (ref 19–32)
Chloride: 92 mEq/L — ABNORMAL LOW (ref 96–112)
Creatinine, Ser: 0.91 mg/dL (ref 0.50–1.10)
GFR calc Af Amer: 85 mL/min — ABNORMAL LOW (ref >90)
Potassium: 2.9 mEq/L — ABNORMAL LOW (ref 3.5–5.1)

## 2013-02-11 LAB — PROTIME-INR: Prothrombin Time: 20.2 seconds — ABNORMAL HIGH (ref 11.6–15.2)

## 2013-02-11 LAB — GLUCOSE, CAPILLARY: Glucose-Capillary: 88 mg/dL (ref 70–99)

## 2013-02-11 MED ORDER — FUROSEMIDE 80 MG PO TABS: 160.0000 mg | ORAL_TABLET | Freq: Every day | ORAL | Status: DC

## 2013-02-11 MED ORDER — POTASSIUM CHLORIDE ER 10 MEQ PO TBCR: 20.0000 meq | EXTENDED_RELEASE_TABLET | Freq: Two times a day (BID) | ORAL | Status: DC

## 2013-02-11 MED ORDER — MILK AND MOLASSES ENEMA
Freq: Once | RECTAL | Status: AC
  Administered 2013-02-11: 13:00:00 via RECTAL

## 2013-02-11 MED ORDER — POTASSIUM CHLORIDE CRYS ER 20 MEQ PO TBCR
40.0000 meq | EXTENDED_RELEASE_TABLET | ORAL | Status: AC
  Administered 2013-02-11 (×2): 40 meq via ORAL
  Filled 2013-02-11 (×2): qty 2

## 2013-02-11 MED ORDER — WARFARIN SODIUM 7.5 MG PO TABS
7.5000 mg | ORAL_TABLET | Freq: Once | ORAL | Status: AC
  Administered 2013-02-11: 7.5 mg via ORAL
  Filled 2013-02-11: qty 1

## 2013-02-11 MED ORDER — PREDNISONE 10 MG PO TABS: ORAL_TABLET | ORAL | Status: DC

## 2013-02-11 MED ORDER — LEVOFLOXACIN 750 MG PO TABS: 750.0000 mg | ORAL_TABLET | Freq: Every day | ORAL | Status: DC

## 2013-02-11 MED ORDER — HYDROCODONE-ACETAMINOPHEN 7.5-325 MG PO TABS: 1.0000 | ORAL_TABLET | Freq: Four times a day (QID) | ORAL | Status: DC | PRN

## 2013-02-11 MED ORDER — LISINOPRIL 10 MG PO TABS: 5.0000 mg | ORAL_TABLET | Freq: Every day | ORAL | Status: DC

## 2013-02-11 MED ORDER — HYDROCODONE-ACETAMINOPHEN 7.5-500 MG PO TABS: 1.0000 | ORAL_TABLET | Freq: Four times a day (QID) | ORAL | Status: DC | PRN

## 2013-02-11 NOTE — Progress Notes (Signed)
ANTICOAGULATION CONSULT NOTE   Pharmacy Consult for Coumadin Indication: AFIB  Allergies  Allergen Reactions  . Doxycycline     itch  . Nitroglycerin Other (See Comments)    CAUSED NUMBNESS ALL OVER  . Vesicare (Solifenacin)     Makes my sweat turn yellow   Patient Measurements: Height: 5\' 6"  (167.6 cm) Weight: 219 lb 1.6 oz (99.383 kg) IBW/kg (Calculated) : 59.3  Vital Signs: Temp: 98.1 F (36.7 C) (03/08 0429) Temp src: Oral (03/08 0429) BP: 120/80 mmHg (03/08 0429) Pulse Rate: 79 (03/08 0429)  Labs:  Recent Labs  02/08/13 1052 02/09/13 0523 02/10/13 0510 02/11/13 0531  HGB  --   --  12.6  --   HCT  --   --  37.6  --   PLT  --   --  238  --   LABPROT 15.1 18.0* 19.5* 20.2*  INR 1.21 1.54* 1.71* 1.79*  CREATININE  --  0.92 0.97 0.91  TROPONINI 0.38*  --   --   --    Estimated Creatinine Clearance: 89.9 ml/min (by C-G formula based on Cr of 0.91).  Medical History: Past Medical History  Diagnosis Date  . Pacemaker     vent paced  . COPD (chronic obstructive pulmonary disease)   . CHF (congestive heart failure)   . Thyroid disease   . Hepatitis     Hep C  . MI (myocardial infarction)   . Pneumonia   . Dysrhythmia     atrial fibrilation  . Cirrhosis   . Shortness of breath   . Diabetes mellitus   . Chest pain   . GERD (gastroesophageal reflux disease)   . Headache   . Anxiety   . A-fib    Medications:  Scheduled:  . atenolol  50 mg Oral BID  . atorvastatin  20 mg Oral q1800  . budesonide-formoterol  2 puff Inhalation BID  . ceFEPime (MAXIPIME) IV  1 g Intravenous Q8H  . docusate sodium  100 mg Oral BID  . enoxaparin (LOVENOX) injection  100 mg Subcutaneous Q12H  . furosemide  160 mg Oral Daily  . guaiFENesin  600 mg Oral BID  . insulin aspart  0-15 Units Subcutaneous TID WC  . levofloxacin  750 mg Oral Daily  . pantoprazole  40 mg Oral Daily  . predniSONE  50 mg Oral Q breakfast  . sodium chloride  3 mL Intravenous Q12H  . sodium  chloride  3 mL Intravenous Q12H  . tiotropium  18 mcg Inhalation Daily  . vancomycin  1,000 mg Intravenous Q12H  . [COMPLETED] warfarin  7.5 mg Oral ONCE-1800  . Warfarin - Pharmacist Dosing Inpatient   Does not apply q1800  . [DISCONTINUED] enoxaparin (LOVENOX) injection  1 mg/kg Subcutaneous Q12H   Assessment: 46yo female on Coumadin 4mg  daily for afib.  INR was sub-therapeutic on admission likely secondary to non-compliance.  It is trending to goal range in hospital. No bleeding noted.  Patient is on full-dose Lovenox while INR subtherapeutic.   Goal of Therapy:  INR 2-3 Monitor platelets by anticoagulation protocol: Yes   Plan:  Repeat Coumadin 7.5mg  today x 1  INR/PT daily Continue Lovenox 1mg /Kg SQ q12hrs until INR > 2 Monitor CBC for platelets  Junita Push, PharmD, BCPS 02/11/2013,8:43 AM

## 2013-02-11 NOTE — Discharge Summary (Signed)
Physician Discharge Summary  Kim [name] AVW:[id number] DOB: May 13, 1966 DOA: 02/07/2013  PCP: Provider Not In System  Admit date: 02/07/2013 Discharge date: 02/11/2013  Time spent: 45 minutes  Recommendations for Outpatient Follow-up:  1. Patient will be set up with home health services 2. She will follow up with her cardiologist in [place] in 2 weeks 3. She will follow up with primary care doctor in 2 weeks 4. She will need repeat chest xray in 2-3 weeks to ensure resolution of pleural effusion.  Discharge Diagnoses:  Principal Problem:   HCAP (healthcare-associated pneumonia) Active Problems:   COPD (chronic obstructive pulmonary disease)   Chest pain   Atrial fibrillation   Coronary atherosclerosis of native coronary artery   Chronic systolic CHF (congestive heart failure)   Pacemaker   Cirrhosis of liver due to hepatitis C   Discharge Condition: improved  Diet recommendation: low salt  Filed Weights   02/09/13 0543 02/10/13 0544 02/11/13 0429  Weight: 103.284 kg (227 lb 11.2 oz) 99.791 kg (220 lb) 99.383 kg (219 lb 1.6 oz)    History of present illness:  Kim [name] is a 47 y.o. female with a past medical history that is complicated and includes atrial fibrillation, pacemaker, nonobstructive coronary artery disease, hypertension, liver cirrhosis, secondary to hepatitis C presents with complaints of shortness of breath, and a cough. She also mentions, chest pain which she states has been ongoing for the last 3 months with no changes. Regarding the chest pain; it's described as a sharp pain in the retrosternal area and 10 out of 10 in intensity. However, the patient appears to be very comfortable and doesn't appear to be in any discomfort at this time. It's a constant pain. Increases with coughing and deep breathing. She tells me that she was admitted last month to [facility] and had, what sounds like, a thoracentesis. She was given antibiotics. She stayed in  the hospital for about 3-4 days and was discharged with oral antibiotics. Patient is a very poor historian and is unable to provide exact dates of this hospitalization and exact course. She also underwent CT scans of her chest during that time. Denies any vomiting, but has been nauseated. Denies any fever, but has had chills. The cough is mostly dry, but sometimes she gets a brownish expectoration. She says her mother has been sick with upper respiratory infection. Shortness of breath is mainly with exertion. She has noted leg swelling once in a while. She is on diuretics at home.   Hospital Course:  This lady was admitted to the hospital with shortness of breath. She was found to be in volume overload with peripheral edema and also had a right-sided pleural effusion. There was also mention of possible right lower lobe infiltrate concerning for pneumonia. Patient was started on broad-spectrum antibiotics as well as intravenous Lasix. She had good urine output and had improvement in her symptoms. 2-D echocardiogram was done which showed depressed ejection fraction of 35%. Patient is followed by a cardiologist in [place]. She was recently admitted at [facility] for shortness of breath and was found to have a pleural effusion at that time which was tapped. It was felt that her pleural effusion was likely secondary to noncompliance with her diuretics and secondary to her congestive heart failure. With IV Lasix, her shortness of breath has improved and she did not require repeat thoracentesis.. On physical exam, breath sounds are improved in the right lower base. She will need a repeat chest  x-ray in the next 2-3 weeks to ensure resolution of the pleural effusion. Patient did have a mild increase in her troponin at 0.4. This is felt to be troponin leak from her congestive heart failure. She's not having any further chest pain. She was also started on an ACE inhibitor due to her depressed ejection  fraction. She had an episode of wheezing and was started on a course of steroids with her history of COPD. She uses oxygen at home at 2 L per minute. Patient is currently afebrile. It appears that she is returning to her baseline level of functioning.  Regarding her pneumonia. She was started on IV antibiotics. This has been transitioned to oral Levaquin to complete a total of 7 days.  Atrial fibrillation has been controlled on current regimen. She is anticoagulated with Coumadin. This will need to be followed by her primary care doctor.  Procedures: 2D echo: - Left ventricle: Septal apical and infero apical akinesis The cavity size was moderately dilated. Wall thickness was increased in a pattern of moderate LVH. Systolic function was moderately reduced. The estimated ejection fraction was in the range of 35% to 40%. - Mitral valve: Calcified annulus. Mild regurgitation. - Left atrium: The atrium was severely dilated. - Right atrium: The atrium was moderately dilated. - Atrial septum: No defect or patent foramen ovale was identified.     Consultations:  none  Discharge Exam: Filed Vitals:   02/10/13 2021 02/11/13 0429 02/11/13 0741 02/11/13 1359  BP: 126/83 120/80  135/84  Pulse: 75 79  76  Temp: 97.7 F (36.5 C) 98.1 F (36.7 C)  97.9 F (36.6 C)  TempSrc: Oral Oral    Resp: 18 18  18   Height:      Weight:  99.383 kg (219 lb 1.6 oz)    SpO2: 100% 99% 98% 99%    General: NAD Cardiovascular: S1, s2, irregular Respiratory: cta b, improved breath sounds at the right base  Discharge Instructions      Discharge Orders   Future Appointments Provider Department Dept Phone   02/11/2013 11:00 PM [facility] [place] ULTRASOUND [phone]   Patient to arrive 15 minutes prior to appointment time.   Future Orders Complete By Expires     (HEART FAILURE PATIENTS) Call MD:  Anytime you have any of the following symptoms: 1) 3 pound weight gain in 24 hours or 5 pounds in 1 week  2) shortness of breath, with or without a dry hacking cough 3) swelling in the hands, feet or stomach 4) if you have to sleep on extra pillows at night in order to breathe.  As directed     Diet - low sodium heart healthy  As directed     Face-to-face encounter (required for Medicare/Medicaid patients)  As directed     Comments:      I Michelangelo Rindfleisch certify that this patient is under my care and that I, or a nurse practitioner or physician's assistant working with me, had a face-to-face encounter that meets the physician face-to-face encounter requirements with this patient on 02/11/2013. The encounter with the patient was in whole, or in part for the following medical condition(s) which is the primary reason for home health care (List medical condition): Admitted with CHF exacerbation, would benefit from home health RN for disease management and education    Questions:      The encounter with the patient was in whole, or in part, for the following medical condition, which is the primary reason  for home health care:  chf exacerbation    I certify that, based on my findings, the following services are medically necessary home health services:  Nursing    My clinical findings support the need for the above services:  Shortness of breath with activity    Further, I certify that my clinical findings support that this patient is homebound due to:  Shortness of Breath with activity    Reason for Medically Necessary Home Health Services:  Skilled Nursing- Teaching of Disease Process/Symptom Management    Home Health  As directed     Questions:      To provide the following care/treatments:  RN    Increase activity slowly  As directed         Medication List    TAKE these medications       albuterol 108 (90 BASE) MCG/ACT inhaler  Commonly known as:  PROVENTIL HFA;VENTOLIN HFA  Inhale 2 puffs into the lungs every 6 (six) hours as needed.     atenolol 50 MG tablet  Commonly known as:  TENORMIN  Take  50 mg by mouth 2 (two) times daily.     atorvastatin 20 MG tablet  Commonly known as:  LIPITOR  Take 1 tablet (20 mg total) by mouth daily at 6 PM.     furosemide 80 MG tablet  Commonly known as:  LASIX  Take 2 tablets (160 mg total) by mouth daily.     HYDROcodone-acetaminophen 7.5-500 MG per tablet  Commonly known as:  LORTAB 7.5  Take 1 tablet by mouth every 6 (six) hours as needed for pain.     levofloxacin 750 MG tablet  Commonly known as:  LEVAQUIN  Take 1 tablet (750 mg total) by mouth daily.     lisinopril 10 MG tablet  Commonly known as:  PRINIVIL  Take 0.5 tablets (5 mg total) by mouth daily.     potassium chloride 10 MEQ tablet  Commonly known as:  K-DUR  Take 2 tablets (20 mEq total) by mouth 2 (two) times daily.     predniSONE 10 MG tablet  Commonly known as:  DELTASONE  40mg  po daily for 2 days then 30mg  po daily for 2 days then 20mg  po daily for 2 days then 10mg  po daily for 2 days then stop     SYMBICORT IN  Inhale 2 puffs into the lungs 2 (two) times daily.     tiotropium 18 MCG inhalation capsule  Commonly known as:  SPIRIVA  Place 18 mcg into inhaler and inhale daily.     warfarin 4 MG tablet  Commonly known as:  COUMADIN  Take 4 mg by mouth every evening.       Follow-up Information   Follow up with follow up with primary doctor in 2 weeks. (you will need a chest xray in 2 weeks to ensure resolution of fluid in chest)        The results of significant diagnostics from this hospitalization (including imaging, microbiology, ancillary and laboratory) are listed below for reference.    Significant Diagnostic Studies: Dg Chest 2 View  02/07/2013  *RADIOLOGY REPORT*  Clinical Data: Shortness of breath and congestion.  CHEST - 2 VIEW  Comparison: PA and lateral chest 08/29/2012.  Findings: The patient has a moderate right pleural effusion and dense right basilar airspace disease.  A small amount of fluid is seen in the minor fissure.  Left lung is  clear.  No pneumothorax. Cardiomegaly is noted.  IMPRESSION:  1.  Right pleural effusion and basilar airspace disease worrisome for pneumonia.  Recommend follow-up to clearing. 2.  Cardiomegaly without edema.   Original Report Authenticated By: Holley Dexter, M.D.    US Venous Img Lower Bilateral  02/11/2013  *RADIOLOGY REPORT*  Clinical Data: Swelling, evaluate for DVT.  VENOUS DUPLEX ULTRASOUND OF BILATERAL LOWER EXTREMITIES  Technique:  Gray-scale sonography with graded compression, as well as color Doppler and duplex ultrasound, were performed to evaluate the deep venous system of both lower extremities from the level of the common femoral vein through the popliteal and proximal calf veins.  Spectral Doppler was utilized to evaluate flow at rest and with distal augmentation maneuvers.  Comparison:  None.  Findings: The visualized bilateral lower extremity deep venous systems appear patent.  Normal compressibility.  Patent color Doppler flow.  Satisfactory spectral Doppler with respiratory variation and response to augmentation.  The greater saphenous vein, where visualized, is patent and compressible bilaterally.  IMPRESSION: No deep venous thrombosis in the visualized bilateral lower extremities.   Original Report Authenticated By: Charline Bills, M.D.     Microbiology: No results found for this or any previous visit (from the past 240 hour(s)).   Labs: Basic Metabolic Panel:  Recent Labs Lab 02/07/13 2011 02/08/13 0508 02/09/13 0523 02/10/13 0510 02/11/13 0531  NA 139 138 139 138 137  K 3.7 4.7 3.1* 3.5 2.9*  CL 98 101 93* 93* 92*  CO2 31 27 34* 33* 34*  GLUCOSE 115* 132* 93 128* 114*  BUN 5* 6 9 14 17   CREATININE 0.72 0.65 0.92 0.97 0.91  CALCIUM 9.1 9.2 9.0 8.2* 7.7*   Liver Function Tests:  Recent Labs Lab 02/07/13 2011 02/08/13 0508  AST 31 28  ALT 21 21  ALKPHOS 67 67  BILITOT 0.9 1.0  PROT 7.4 7.8  ALBUMIN 3.6 3.6   No results found for this basename: LIPASE,  AMYLASE,  in the last 168 hours No results found for this basename: AMMONIA,  in the last 168 hours CBC:  Recent Labs Lab 02/07/13 2011 02/08/13 0508 02/10/13 0510  WBC 7.7 6.4 11.8*  NEUTROABS 4.6  --   --   HGB 12.8 12.8 12.6  HCT 39.0 39.4 37.6  MCV 96.5 96.1 94.5  PLT 251 250 238   Cardiac Enzymes:  Recent Labs Lab 02/07/13 2303 02/08/13 0508 02/08/13 1052  TROPONINI 0.43* 0.39* 0.38*   BNP: BNP (last 3 results)  Recent Labs  02/07/13 2011 02/09/13 0523 02/10/13 0510  PROBNP 1566.0* 1588.0* 1325.0*   CBG:  Recent Labs Lab 02/10/13 1120 02/10/13 1630 02/10/13 2109 02/11/13 0754 02/11/13 1224  GLUCAP 140* 142* 138* 88 157*       Signed:  Denesha Brouse  [name] 02/11/2013, 4:00 PM

## 2013-02-11 NOTE — Progress Notes (Signed)
Pt ambulated in half of the hallway with no difficulty. O2 sats 94% on 2L/[place].

## 2013-02-11 NOTE — Progress Notes (Signed)
Saline lock removed. Telemetry discontinued. Discharge instructions given. Prescriptions given. Pt verbalized understanding of instructions. Left floor via wheelchair with nursing staff and family member.

## 2013-02-19 ENCOUNTER — Emergency Department (HOSPITAL_COMMUNITY): Payer: Medicaid Other

## 2013-02-19 ENCOUNTER — Inpatient Hospital Stay (HOSPITAL_COMMUNITY)
Admission: EM | Admit: 2013-02-19 | Discharge: 2013-02-20 | DRG: 312 | Disposition: A | Discharge status: Home / Self Care | Payer: Medicaid Other | Attending: Internal Medicine | Admitting: Internal Medicine

## 2013-02-19 ENCOUNTER — Encounter (HOSPITAL_COMMUNITY): Payer: Self-pay | Admitting: *Deleted

## 2013-02-19 DIAGNOSIS — J4489 Other specified chronic obstructive pulmonary disease: Secondary | ICD-10-CM | POA: Diagnosis present

## 2013-02-19 DIAGNOSIS — I5022 Chronic systolic (congestive) heart failure: Secondary | ICD-10-CM

## 2013-02-19 DIAGNOSIS — M545 Low back pain, unspecified: Secondary | ICD-10-CM | POA: Diagnosis present

## 2013-02-19 DIAGNOSIS — I2510 Atherosclerotic heart disease of native coronary artery without angina pectoris: Secondary | ICD-10-CM

## 2013-02-19 DIAGNOSIS — B1920 Unspecified viral hepatitis C without hepatic coma: Secondary | ICD-10-CM | POA: Diagnosis present

## 2013-02-19 DIAGNOSIS — L989 Disorder of the skin and subcutaneous tissue, unspecified: Secondary | ICD-10-CM

## 2013-02-19 DIAGNOSIS — E876 Hypokalemia: Secondary | ICD-10-CM | POA: Diagnosis present

## 2013-02-19 DIAGNOSIS — R079 Chest pain, unspecified: Secondary | ICD-10-CM | POA: Diagnosis present

## 2013-02-19 DIAGNOSIS — B182 Chronic viral hepatitis C: Secondary | ICD-10-CM

## 2013-02-19 DIAGNOSIS — R55 Syncope and collapse: Principal | ICD-10-CM | POA: Diagnosis present

## 2013-02-19 DIAGNOSIS — Z950 Presence of cardiac pacemaker: Secondary | ICD-10-CM

## 2013-02-19 DIAGNOSIS — K7460 Unspecified cirrhosis of liver: Secondary | ICD-10-CM | POA: Diagnosis present

## 2013-02-19 DIAGNOSIS — J189 Pneumonia, unspecified organism: Secondary | ICD-10-CM

## 2013-02-19 DIAGNOSIS — I509 Heart failure, unspecified: Secondary | ICD-10-CM | POA: Diagnosis present

## 2013-02-19 DIAGNOSIS — I5043 Acute on chronic combined systolic (congestive) and diastolic (congestive) heart failure: Secondary | ICD-10-CM

## 2013-02-19 DIAGNOSIS — I4891 Unspecified atrial fibrillation: Secondary | ICD-10-CM | POA: Diagnosis present

## 2013-02-19 DIAGNOSIS — J449 Chronic obstructive pulmonary disease, unspecified: Secondary | ICD-10-CM

## 2013-02-19 LAB — BASIC METABOLIC PANEL
BUN: 7 mg/dL (ref 6–23)
Chloride: 103 mEq/L (ref 96–112)
GFR calc Af Amer: >90 mL/min (ref >90)
GFR calc non Af Amer: >90 mL/min (ref >90)
Glucose, Bld: 102 mg/dL — ABNORMAL HIGH (ref 70–99)
Potassium: 3.5 mEq/L (ref 3.5–5.1)
Sodium: 143 mEq/L (ref 135–145)

## 2013-02-19 LAB — CBC WITH DIFFERENTIAL/PLATELET
Hemoglobin: 12.8 g/dL (ref 12.0–15.0)
Lymphs Abs: 3.9 10*3/uL (ref 0.7–4.0)
Monocytes Relative: 10 % (ref 3–12)
Neutro Abs: 8.2 10*3/uL — ABNORMAL HIGH (ref 1.7–7.7)
Neutrophils Relative %: 60 % (ref 43–77)
Platelets: 226 10*3/uL (ref 150–400)
RBC: 4.02 MIL/uL (ref 3.87–5.11)
WBC: 13.6 10*3/uL — ABNORMAL HIGH (ref 4.0–10.5)

## 2013-02-19 MED ORDER — ALBUTEROL (5 MG/ML) CONTINUOUS INHALATION SOLN
10.0000 mg | INHALATION_SOLUTION | RESPIRATORY_TRACT | Status: AC
  Administered 2013-02-19: 10 mg via RESPIRATORY_TRACT
  Filled 2013-02-19: qty 20

## 2013-02-19 MED ORDER — LEVOFLOXACIN IN D5W 750 MG/150ML IV SOLN
750.0000 mg | Freq: Once | INTRAVENOUS | Status: AC
  Administered 2013-02-19: 750 mg via INTRAVENOUS
  Filled 2013-02-19: qty 150

## 2013-02-19 NOTE — H&P (Addendum)
History and Physical  Kim [name]:[id number] DOB: 21-Sep-1966 DOA: 02/19/2013  Referring physician: Dr. Eber Hong PCP: Provider Not In System   Chief Complaint: Syncope  HPI:  Patient is a 47 year old female with past medical history most significant for chronic respiratory failure on home oxygen from COPD, hepatitis C, congestive heart failure, diabetes and current smoker who was brought in the ER after being found by her daughter on the floor of the bathroom. Patient was finishing up using the bathroom and got up from commode to pull up her pants when she suddenly felt dizzy and had a fall on the floor. She did not hit her head. She does not remember for how long she was unconscious. When her daughter was passing by the bathroom she noticed that her mom was sitting on the floor and was a bit confused. The daughter helped her back to her bed the patient denied any complaints at this time. Specifically she denied any weakness or numbness anywhere in the body, dysarthria, involuntary movements, bleeding from anywhere in the body. Patient does have chronic chest pain which she attributes to her being going in and out of atrial fibrillation. The chest pain is not worse than usual and she denies any increased shortness of breath. The pain is described more as a soreness 2/10 in intensity.  Patient had a similar episode of transient loss of consciousness and fall about 2-3 years ago.  She denies any dizziness right now, denies any shortness of breath, diaphoresis, nausea, vomiting.  Patient was discharged 10 days ago from the hospital and was treated for healthcare associated pneumonia  Review of Systems:  15 point review of system was negative except for as noted above.  Past Medical History  Diagnosis Date  . Pacemaker     vent paced  . COPD (chronic obstructive pulmonary disease)   . CHF (congestive heart failure)   . Thyroid disease   . Hepatitis     Hep C  . MI (myocardial  infarction)   . Pneumonia   . Dysrhythmia     atrial fibrilation  . Cirrhosis   . Shortness of breath   . Diabetes mellitus   . Chest pain   . GERD (gastroesophageal reflux disease)   . Headache   . Anxiety   . A-fib     Past Surgical History  Procedure Laterality Date  . Cholecystectomy    . Pacemaker insertion    . Insert / replace / remove pacemaker      02/2012    Social History:  reports that she has been smoking Cigarettes.  She has a 15 pack-year smoking history. She has quit using smokeless tobacco. Her smokeless tobacco use included Snuff. She reports that she does not drink alcohol or use illicit drugs.  Allergies  Allergen Reactions  . Doxycycline     itch  . Nitroglycerin Other (See Comments)    CAUSED NUMBNESS ALL OVER  . Vesicare (Solifenacin)     Makes my sweat turn yellow    Family History  Problem Relation Age of Onset  . Coronary artery disease Father 21  . Coronary artery disease Mother 3  . Coronary artery disease Brother      Prior to Admission medications   Medication Sig Start Date End Date Taking? Authorizing Provider  albuterol (PROVENTIL HFA;VENTOLIN HFA) 108 (90 BASE) MCG/ACT inhaler Inhale 2 puffs into the lungs every 6 (six) hours as needed.    Historical Provider, MD  atenolol (TENORMIN) 50  MG tablet Take 50 mg by mouth 2 (two) times daily.    Historical Provider, MD  atorvastatin (LIPITOR) 20 MG tablet Take 1 tablet (20 mg total) by mouth daily at 6 PM. 07/23/12 07/23/13  Leodis Sias, MD  Budesonide-Formoterol Fumarate (SYMBICORT IN) Inhale 2 puffs into the lungs 2 (two) times daily.    Historical Provider, MD  furosemide (LASIX) 80 MG tablet Take 2 tablets (160 mg total) by mouth daily. 02/11/13   Erick Blinks, MD  HYDROcodone-acetaminophen (NORCO) 7.5-325 MG per tablet Take 1 tablet by mouth every 6 (six) hours as needed for pain. 02/11/13   Erick Blinks, MD  lisinopril (PRINIVIL) 10 MG tablet Take 0.5 tablets (5 mg total) by  mouth daily. 02/11/13   Erick Blinks, MD  potassium chloride (K-DUR) 10 MEQ tablet Take 2 tablets (20 mEq total) by mouth 2 (two) times daily. 02/11/13   Erick Blinks, MD  tiotropium (SPIRIVA) 18 MCG inhalation capsule Place 18 mcg into inhaler and inhale daily.    Historical Provider, MD  warfarin (COUMADIN) 4 MG tablet Take 4 mg by mouth every evening.    Historical Provider, MD   Physical Exam: Filed Vitals:   02/19/13 1909 02/19/13 2228 02/19/13 2327  BP: 140/94  134/69  Pulse: 80  77  Temp: 97.3 F (36.3 C)    TempSrc: Oral    Resp: 20  36  Height: 6' (1.829 m)    Weight: 225 lb (102.059 kg)    SpO2: 100% 100% 98%  Physical Exam: General: Vital signs reviewed and noted. Well-developed, well-nourished, in no acute distress; alert, appropriate and cooperative throughout examination.  Head: Normocephalic, atraumatic.  Eyes: PERRL, EOMI, No signs of anemia or jaundince.  Nose: Mucous membranes moist, not inflammed, nonerythematous.  Throat: Oropharynx nonerythematous, no exudate appreciated.   Neck: No deformities, masses, or tenderness noted.Supple, No carotid Bruits, no JVD.  Lungs:  Normal respiratory effort. Patient has diffuse bilateral wheezes expiratory more than inspiratory   Heart: RRR. S1 and S2 normal without gallop, murmur, or rubs.  Abdomen:  BS normoactive. Soft, Nondistended, non-tender.  No masses or organomegaly.  Extremities: No pretibial edema.  Neurologic: A&O X3, CN II - XII are grossly intact. Motor strength is 5/5 in the all 4 extremities, Sensations intact to light touch, Cerebellar signs negative.  Skin: No visible rashes, scars.     Wt Readings from Last 3 Encounters:  02/19/13 225 lb (102.059 kg)  02/11/13 219 lb 1.6 oz (99.383 kg)  12/22/12 224 lb (101.606 kg)    Labs on Admission:  Basic Metabolic Panel:  Recent Labs Lab 02/19/13 2156  NA 143  K 3.5  CL 103  CO2 30  GLUCOSE 102*  BUN 7  CREATININE 0.72  CALCIUM 8.7   CBC:  Recent  Labs Lab 02/19/13 2156  WBC 13.6*  NEUTROABS 8.2*  HGB 12.8  HCT 38.9  MCV 96.8  PLT 226    Cardiac Enzymes:  Recent Labs Lab 02/19/13 2156  TROPONINI <0.30   BNP (last 3 results)  Recent Labs  02/07/13 2011 02/09/13 0523 02/10/13 0510  PROBNP 1566.0* 1588.0* 1325.0*    Radiological Exams on Admission: Dg Chest 2 View  02/19/2013  *RADIOLOGY REPORT*  Clinical Data: Shortness of breath.  Syncope.  Fall.  CHEST - 2 VIEW  Comparison: Two-view chest x-ray 02/07/2013, 08/29/2012.  Findings: Cardiac silhouette moderately enlarged but stable.  Left subclavian dual lead transvenous pacemaker unchanged appears intact.  Interval improvement in the right pleural effusion since  the examination 12 days ago.  Improved aeration in the right lower lobe and right middle lobe, though airspace opacities persist.  No new pulmonary parenchymal abnormalities.  Pulmonary vascularity normal without evidence of pulmonary edema.  IMPRESSION: Stable cardiomegaly without pulmonary edema.  Significant improvement in the right pleural effusion since the examination 12 days ago.  Improved aeration in the right middle and lower lobes, though airspace opacities persist which may reflect atelectasis or pneumonia.  No new abnormalities.   Original Report Authenticated By: Hulan Saas, M.D.    Dg Thoracic Spine 2 View  02/19/2013  *RADIOLOGY REPORT*  Clinical Data: Syncope.  Mid back pain after a fall.  THORACIC SPINE - 2 VIEW  Comparison: None.  Findings: 12 rib-bearing thoracic vertebrae with anatomic alignment.  No fractures.  Mild mid and lower thoracic spondylosis. Slight thoracolumbar scoliosis convex right which may be in part positional.  Intact pedicles.  IMPRESSION: No acute osseous abnormality.  Mild mid and lower thoracic spondylosis.   Original Report Authenticated By: Hulan Saas, M.D.     EKG: Independently reviewed. Irregularly irregular rhythm 73 beats per minute, normal axis, nonspecific ST  or T wave changes noted   Principal Problem:   Syncope Active Problems:   COPD (chronic obstructive pulmonary disease)   Chest pain   Atrial fibrillation   Chronic systolic CHF (congestive heart failure)   Cirrhosis of liver due to hepatitis C   Assessment/Plan Patient is a 47 year old female with past medical history very complicated and consistent of COPD which is oxygen dependent, atrial fibrillation, chronic systolic congestive heart failure, liver cirrhosis from hepatitis C and chronic chest pain who comes in with an episode of syncope.   Syncope: Given that patient has significant cardiac history of systolic congestive heart failure, atrial fibrillation and rhythm abnormalities status post pacemaker insertion, cardiac arrhythmia seems to be the most likely cause of patient's syncope along with vasovagal. Based on the history this seems most consistent with vasovagal syncope. Patient was finishing up using the bathroom and fell as she was trying to get up which is sort of classical for vasovagal. Patient also complaining of chest pain but it is chronic and is not more than usual. -Monitor the patient in telemetry overnight -Cycle cardiac enzymes x3 to rule out ACS -Continue home medications -Oxycodone for chest pain, oxygen -12 lead EKG in the morning  COPD: Patient does not have any signs of symptoms consistent with exacerbation at this time. -Continue home inhalers  Atrial fibrillation: -Continue home dose of beta blocker -Coumadin per pharmacy  The rest of the medical management as per orders.   Code Status:  full code Family Communication:  patient and her daughter updated at bedside Disposition Plan/Anticipated LOS:  guarded at this time  Time spent:  70 minutes  Lars Mage, MD  [name]   If 7PM-7AM, please contact night-coverage at [email or web address], password [facility] 02/20/2013, 12:03 AM

## 2013-02-19 NOTE — ED Provider Notes (Signed)
History  This chart was scribed for Vida Roller, MD by Bennett Scrape, ED Scribe. This patient was seen in room APA11/APA11 and the patient's care was started at 9:38 PM.  CSN: [id number]  Arrival date & time 02/19/13  1907   First MD Initiated Contact with Patient 02/19/13 2138      Chief Complaint  Patient presents with  . Near Syncope    The history is provided by the patient. No language interpreter was used.    Kim [name] is a 47 y.o. female who presents to the Emergency Department complaining of one episode of syncope that occurred last night with associated back pain and CP that started approximately 24 hours ago. Pt states that she woke up sometime during the night to go to the bathroom and woke up on the floor. Pt states that she remembers waking up to go to the bathroom and then remembers waking up on the floor. It is unclear what happened in between. Pt states that nocturia is frequent for her and denies any changes. Daughter states that the patient was laying on the floor and asked for help. Daughter got her up, pulled her pants up and got her back in bed. O2 was put back on and pt states that she felt back to baseline. Pt reports one prior episode of syncope 2 or 3 years ago and states that she required intubation for PNA. She states that she was discharged the same day of the intubation but said that "I had to call back but they never followed up with me". Pt then went on a tangent about [place] who she has been seeing for a "malfunctioning pacemaker". She states that she has been having increased episodes of A. FIb and "they won't do nothing about it". She also stated that she has been having frequent episodes of CP and reports "they keep telling me it's from a spot on my right lung". She states that an CXR was performed but denies getting any results. She denies SOB, dizziness, diaphoresis and nausea as associated symptoms. She has a h/o DM, GERD and anxiety. She is a current  everyday smoker but denies alcohol use.   Past Medical History  Diagnosis Date  . Pacemaker     vent paced  . COPD (chronic obstructive pulmonary disease)   . CHF (congestive heart failure)   . Thyroid disease   . Hepatitis     Hep C  . MI (myocardial infarction)   . Pneumonia   . Dysrhythmia     atrial fibrilation  . Cirrhosis   . Shortness of breath   . Diabetes mellitus   . Chest pain   . GERD (gastroesophageal reflux disease)   . Headache   . Anxiety   . A-fib     Past Surgical History  Procedure Laterality Date  . Cholecystectomy    . Pacemaker insertion    . Insert / replace / remove pacemaker      02/2012    Family History  Problem Relation Age of Onset  . Coronary artery disease Father 62  . Coronary artery disease Mother 43  . Coronary artery disease Brother     History  Substance Use Topics  . Smoking status: Current Every Day Smoker -- 0.50 packs/day for 30 years    Types: Cigarettes  . Smokeless tobacco: Former Neurosurgeon    Types: Snuff  . Alcohol Use: No    No OB history provided.  Review of Systems  Constitutional: Negative for fever.  Respiratory: Negative for shortness of breath.   Cardiovascular: Positive for chest pain.  Gastrointestinal: Negative for nausea and vomiting.  Musculoskeletal: Positive for back pain.  Neurological: Positive for syncope. Negative for dizziness.  All other systems reviewed and are negative.    Allergies  Doxycycline; Nitroglycerin; and Vesicare  Home Medications   Current Outpatient Rx  Name  Route  Sig  Dispense  Refill  . albuterol (PROVENTIL HFA;VENTOLIN HFA) 108 (90 BASE) MCG/ACT inhaler   Inhalation   Inhale 2 puffs into the lungs every 6 (six) hours as needed.         Marland Kitchen atenolol (TENORMIN) 50 MG tablet   Oral   Take 50 mg by mouth 2 (two) times daily.         Marland Kitchen atorvastatin (LIPITOR) 20 MG tablet   Oral   Take 1 tablet (20 mg total) by mouth daily at 6 PM.   30 tablet   1   .  Budesonide-Formoterol Fumarate (SYMBICORT IN)   Inhalation   Inhale 2 puffs into the lungs 2 (two) times daily.         . furosemide (LASIX) 80 MG tablet   Oral   Take 2 tablets (160 mg total) by mouth daily.   60 tablet   1     This is a Month's supply   . HYDROcodone-acetaminophen (NORCO) 7.5-325 MG per tablet   Oral   Take 1 tablet by mouth every 6 (six) hours as needed for pain.   20 tablet   0   . lisinopril (PRINIVIL) 10 MG tablet   Oral   Take 0.5 tablets (5 mg total) by mouth daily.   30 tablet   1   . potassium chloride (K-DUR) 10 MEQ tablet   Oral   Take 2 tablets (20 mEq total) by mouth 2 (two) times daily.   60 tablet   1   . tiotropium (SPIRIVA) 18 MCG inhalation capsule   Inhalation   Place 18 mcg into inhaler and inhale daily.         Marland Kitchen warfarin (COUMADIN) 4 MG tablet   Oral   Take 4 mg by mouth every evening.           Triage Vitals: BP 140/94  Pulse 80  Temp(Src) 97.3 F (36.3 C) (Oral)  Resp 20  Ht 6' (1.829 m)  Wt 225 lb (102.059 kg)  BMI 30.51 kg/m2  SpO2 100%  LMP 02/08/2007  Physical Exam  Nursing note and vitals reviewed. Constitutional: She is oriented to person, place, and time. She appears well-developed and well-nourished. No distress.  HENT:  Head: Normocephalic and atraumatic.  Mouth/Throat: Oropharynx is clear and moist.  Eyes: Conjunctivae and EOM are normal. Pupils are equal, round, and reactive to light.  Neck: Neck supple. No tracheal deviation present.  Cardiovascular: Normal rate and regular rhythm.  Exam reveals no gallop and no friction rub.   No murmur heard. Pulmonary/Chest: Effort normal. No respiratory distress. She has wheezes (diffuse).  Decreased lung sounds at the right lung base  Abdominal: Soft. There is no tenderness.  Musculoskeletal: Normal range of motion. She exhibits no edema.  Neurological: She is alert and oriented to person, place, and time.  Skin: Skin is warm and dry.  Psychiatric: She  has a normal mood and affect. Her behavior is normal.    ED Course  Procedures (including critical care time)  DIAGNOSTIC STUDIES: Oxygen Saturation is 100% on room air,  normal by my interpretation.    COORDINATION OF CARE: 10:10 PM-Discussed treatment plan which includes breathing treatment, CXR, CBC panel, and troponin with pt at bedside and pt agreed to plan.   Labs Reviewed  CBC WITH DIFFERENTIAL - Abnormal; Notable for the following:    WBC 13.6 (*)    RDW 16.9 (*)    Neutro Abs 8.2 (*)    Monocytes Absolute 1.4 (*)    All other components within normal limits  BASIC METABOLIC PANEL - Abnormal; Notable for the following:    Glucose, Bld 102 (*)    All other components within normal limits  TROPONIN I   Dg Chest 2 View  02/19/2013  *RADIOLOGY REPORT*  Clinical Data: Shortness of breath.  Syncope.  Fall.  CHEST - 2 VIEW  Comparison: Two-view chest x-ray 02/07/2013, 08/29/2012.  Findings: Cardiac silhouette moderately enlarged but stable.  Left subclavian dual lead transvenous pacemaker unchanged appears intact.  Interval improvement in the right pleural effusion since the examination 12 days ago.  Improved aeration in the right lower lobe and right middle lobe, though airspace opacities persist.  No new pulmonary parenchymal abnormalities.  Pulmonary vascularity normal without evidence of pulmonary edema.  IMPRESSION: Stable cardiomegaly without pulmonary edema.  Significant improvement in the right pleural effusion since the examination 12 days ago.  Improved aeration in the right middle and lower lobes, though airspace opacities persist which may reflect atelectasis or pneumonia.  No new abnormalities.   Original Report Authenticated By: Hulan Saas, M.D.    Dg Thoracic Spine 2 View  02/19/2013  *RADIOLOGY REPORT*  Clinical Data: Syncope.  Mid back pain after a fall.  THORACIC SPINE - 2 VIEW  Comparison: None.  Findings: 12 rib-bearing thoracic vertebrae with anatomic alignment.   No fractures.  Mild mid and lower thoracic spondylosis. Slight thoracolumbar scoliosis convex right which may be in part positional.  Intact pedicles.  IMPRESSION: No acute osseous abnormality.  Mild mid and lower thoracic spondylosis.   Original Report Authenticated By: Hulan Saas, M.D.      1. Syncope   2. HCAP (healthcare-associated pneumonia)       MDM  The patient has had a syncopal episode, the daughter states that this happened when she was getting up from the toilet. The patient does not have any recall of this event. She does note that she has been complaining of chest pain intermittently over the last 24 hours from before this fall to now. She denies back pain ever since the fall. She does have wheezing and abnormal lung sounds, she does not appear to be in acute respiratory distress and has normal oxygenation on her home oxygen. There was some concern that this fall occurred while she was off of her oxygen but the patient has no memory of these events. EKG shows a paced rhythm, underlying atrial flutter, not significant change from a prior EKG. X-rays pending, labs pending, anticipate admission for syncopal event with chest pain. The patient has multiple comorbidities make her high risk for acute coronary syndrome.  ED ECG REPORT  I personally interpreted this EKG   Date: 02/19/2013   Rate: 73  Rhythm: Electronically paced with underlying atrial flutter  QRS Axis: left  Intervals: Paced rhythm, prolonged QRS  ST/T Wave abnormalities: nonspecific ST/T changes  Conduction Disutrbances:Paced rhythm  Narrative Interpretation:   Old EKG Reviewed: unchanged and Compared with 02/07/2013, no significant changes   I personally interpreted the chest x-ray and it shows that there is a  right lower lobe infiltrate versus atelectasis. It is difficult to know this is a new finding is the last x-ray showed that the patient and a large pleural effusion. The effusion has improved  significantly.  Laboratory data shows that she has a leukocytosis, vital signs are relatively stable for the patient is requiring albuterol treatment for her wheezing and shortness of breath. Case discussed with hospitalist who will admit.    I personally performed the services described in this documentation, which was scribed in my presence. The recorded information has been reviewed and is accurate.      Vida Roller, MD 02/19/13 [phone]

## 2013-02-19 NOTE — ED Notes (Addendum)
Pt states she woke up in the middle of the night and fell to the floor. Denies loc. Pt is supposed to wear 3 liters of o2 but she has only been using the concentrator at home some other piece is broken. Pt also c/o lower back pain.

## 2013-02-20 ENCOUNTER — Encounter (HOSPITAL_COMMUNITY): Payer: Self-pay | Admitting: *Deleted

## 2013-02-20 DIAGNOSIS — I5022 Chronic systolic (congestive) heart failure: Secondary | ICD-10-CM

## 2013-02-20 DIAGNOSIS — E876 Hypokalemia: Secondary | ICD-10-CM

## 2013-02-20 DIAGNOSIS — I509 Heart failure, unspecified: Secondary | ICD-10-CM

## 2013-02-20 DIAGNOSIS — I4891 Unspecified atrial fibrillation: Secondary | ICD-10-CM

## 2013-02-20 DIAGNOSIS — M545 Low back pain, unspecified: Secondary | ICD-10-CM

## 2013-02-20 DIAGNOSIS — R55 Syncope and collapse: Principal | ICD-10-CM

## 2013-02-20 LAB — HEMOGLOBIN A1C
Hgb A1c MFr Bld: 6 % — ABNORMAL HIGH (ref <5.7)
Mean Plasma Glucose: 126 mg/dL — ABNORMAL HIGH (ref <117)

## 2013-02-20 LAB — BASIC METABOLIC PANEL
Calcium: 8.1 mg/dL — ABNORMAL LOW (ref 8.4–10.5)
GFR calc Af Amer: >90 mL/min (ref >90)
GFR calc non Af Amer: >90 mL/min (ref >90)
Glucose, Bld: 168 mg/dL — ABNORMAL HIGH (ref 70–99)
Sodium: 142 mEq/L (ref 135–145)

## 2013-02-20 LAB — TSH: TSH: 3.994 u[IU]/mL (ref 0.350–4.500)

## 2013-02-20 LAB — TROPONIN I: Troponin I: <0.3 ng/mL (ref <0.30)

## 2013-02-20 LAB — CBC
MCH: 31 pg (ref 26.0–34.0)
Platelets: 193 10*3/uL (ref 150–400)
RBC: 3.58 MIL/uL — ABNORMAL LOW (ref 3.87–5.11)

## 2013-02-20 LAB — PROTIME-INR: Prothrombin Time: 15.3 seconds — ABNORMAL HIGH (ref 11.6–15.2)

## 2013-02-20 MED ORDER — HYDROCODONE-ACETAMINOPHEN 5-325 MG PO TABS
ORAL_TABLET | ORAL | Status: AC
  Filled 2013-02-20: qty 1

## 2013-02-20 MED ORDER — POTASSIUM CHLORIDE CRYS ER 20 MEQ PO TBCR
40.0000 meq | EXTENDED_RELEASE_TABLET | Freq: Once | ORAL | Status: AC
  Administered 2013-02-20: 40 meq via ORAL
  Filled 2013-02-20: qty 2

## 2013-02-20 MED ORDER — ONDANSETRON HCL 4 MG/2ML IJ SOLN
4.0000 mg | Freq: Four times a day (QID) | INTRAMUSCULAR | Status: DC | PRN
  Administered 2013-02-20: 4 mg via INTRAVENOUS
  Filled 2013-02-20: qty 2

## 2013-02-20 MED ORDER — CHLORHEXIDINE GLUCONATE 0.12 % MT SOLN: 15.0000 mL | Freq: Two times a day (BID) | OROMUCOSAL | Status: DC

## 2013-02-20 MED ORDER — ENOXAPARIN SODIUM 60 MG/0.6ML ~~LOC~~ SOLN: 50.0000 mg | SUBCUTANEOUS | Status: DC

## 2013-02-20 MED ORDER — SODIUM CHLORIDE 0.9 % IJ SOLN
3.0000 mL | Freq: Two times a day (BID) | INTRAMUSCULAR | Status: DC
  Administered 2013-02-20 (×2): 3 mL via INTRAVENOUS

## 2013-02-20 MED ORDER — WARFARIN - PHARMACIST DOSING INPATIENT: Freq: Every day | Status: DC

## 2013-02-20 MED ORDER — BUDESONIDE-FORMOTEROL FUMARATE 80-4.5 MCG/ACT IN AERO
2.0000 | INHALATION_SPRAY | Freq: Two times a day (BID) | RESPIRATORY_TRACT | Status: DC
  Filled 2013-02-20: qty 6.9

## 2013-02-20 MED ORDER — HYDROCODONE-ACETAMINOPHEN 10-325 MG PO TABS
1.0000 | ORAL_TABLET | Freq: Four times a day (QID) | ORAL | Status: DC | PRN
Start: 2013-02-20 — End: 2013-03-01

## 2013-02-20 MED ORDER — ATORVASTATIN CALCIUM 20 MG PO TABS: 20.0000 mg | ORAL_TABLET | Freq: Every day | ORAL | Status: DC

## 2013-02-20 MED ORDER — BIOTENE DRY MOUTH MT LIQD: 15.0000 mL | Freq: Two times a day (BID) | OROMUCOSAL | Status: DC

## 2013-02-20 MED ORDER — POTASSIUM CHLORIDE CRYS ER 10 MEQ PO TBCR
20.0000 meq | EXTENDED_RELEASE_TABLET | Freq: Two times a day (BID) | ORAL | Status: DC
  Administered 2013-02-20 (×2): 20 meq via ORAL
  Filled 2013-02-20 (×4): qty 2

## 2013-02-20 MED ORDER — FUROSEMIDE 80 MG PO TABS
160.0000 mg | ORAL_TABLET | Freq: Every day | ORAL | Status: DC
  Administered 2013-02-20: 160 mg via ORAL
  Filled 2013-02-20: qty 2

## 2013-02-20 MED ORDER — HYDROCODONE-ACETAMINOPHEN 7.5-325 MG PO TABS
1.0000 | ORAL_TABLET | Freq: Four times a day (QID) | ORAL | Status: DC | PRN
  Administered 2013-02-20 (×2): 1 via ORAL
  Filled 2013-02-20: qty 1

## 2013-02-20 MED ORDER — DIPHENHYDRAMINE HCL 25 MG PO CAPS
25.0000 mg | ORAL_CAPSULE | Freq: Four times a day (QID) | ORAL | Status: DC | PRN
  Administered 2013-02-20: 25 mg via ORAL
  Filled 2013-02-20: qty 1

## 2013-02-20 MED ORDER — ALBUTEROL SULFATE (5 MG/ML) 0.5% IN NEBU
2.5000 mg | INHALATION_SOLUTION | Freq: Four times a day (QID) | RESPIRATORY_TRACT | Status: DC
  Administered 2013-02-20: 2.5 mg via RESPIRATORY_TRACT
  Filled 2013-02-20: qty 0.5

## 2013-02-20 MED ORDER — ONDANSETRON HCL 4 MG PO TABS: 4.0000 mg | ORAL_TABLET | Freq: Four times a day (QID) | ORAL | Status: DC | PRN

## 2013-02-20 MED ORDER — OXYCODONE HCL 5 MG PO TABS
5.0000 mg | ORAL_TABLET | ORAL | Status: DC | PRN
  Administered 2013-02-20 (×2): 5 mg via ORAL
  Filled 2013-02-20 (×2): qty 1

## 2013-02-20 MED ORDER — LISINOPRIL 5 MG PO TABS
5.0000 mg | ORAL_TABLET | Freq: Every day | ORAL | Status: DC
  Administered 2013-02-20: 5 mg via ORAL
  Filled 2013-02-20: qty 1

## 2013-02-20 MED ORDER — HYDROCODONE-ACETAMINOPHEN 10-325 MG PO TABS: 1.0000 | ORAL_TABLET | Freq: Four times a day (QID) | ORAL | Status: DC | PRN

## 2013-02-20 MED ORDER — ENOXAPARIN SODIUM 40 MG/0.4ML ~~LOC~~ SOLN
40.0000 mg | SUBCUTANEOUS | Status: DC
  Administered 2013-02-20: 40 mg via SUBCUTANEOUS
  Filled 2013-02-20: qty 0.4

## 2013-02-20 MED ORDER — SODIUM CHLORIDE 0.9 % IV SOLN
INTRAVENOUS | Status: DC
  Administered 2013-02-20: 03:00:00 via INTRAVENOUS

## 2013-02-20 MED ORDER — TIOTROPIUM BROMIDE MONOHYDRATE 18 MCG IN CAPS
18.0000 ug | ORAL_CAPSULE | Freq: Every day | RESPIRATORY_TRACT | Status: DC
  Filled 2013-02-20: qty 5

## 2013-02-20 MED ORDER — ATENOLOL 25 MG PO TABS
50.0000 mg | ORAL_TABLET | Freq: Two times a day (BID) | ORAL | Status: DC
  Administered 2013-02-20 (×2): 50 mg via ORAL
  Filled 2013-02-20 (×2): qty 2

## 2013-02-20 MED ORDER — WARFARIN SODIUM 5 MG PO TABS: 5.0000 mg | ORAL_TABLET | Freq: Once | ORAL | Status: DC

## 2013-02-20 NOTE — Discharge Summary (Signed)
Physician Discharge Summary  Kim [name] ZOX:[id number] DOB: [birth year] DOA: 02/19/2013  PCP: Provider Not In System  Admit date: 02/19/2013 Discharge date: 02/20/2013  Time spent: 40 minutes  Recommendations for Outpatient Follow-up:  1. Follow up with PCP 1 week  Discharge Diagnoses:  Principal Problem:   Syncope Active Problems:   COPD (chronic obstructive pulmonary disease)   Chest pain   Atrial fibrillation   Hypokalemia   Chronic systolic CHF (congestive heart failure)   Cirrhosis of liver due to hepatitis C   Low back pain   Discharge Condition: stable  Diet recommendation: heart healthy  Filed Weights   02/19/13 1909 02/20/13 0100  Weight: 102.059 kg (225 lb) 106.9 kg (235 lb 10.8 oz)    History of present illness:   Patient is a 47 year old female with past medical history most significant for chronic respiratory failure on home oxygen from COPD, hepatitis C, congestive heart failure, diabetes and current smoker who was brought in the ER on 02/19/13 after being found by her daughter on the floor of the bathroom. Patient was finishing up using the bathroom and got up from commode to pull up her pants when she suddenly felt dizzy and had a fall on the floor. She did not hit her head. She did not remember for how long she was unconscious. When her daughter was passing by the bathroom she noticed that her mom was sitting on the floor and was a bit confused. The daughter helped her back to her bed the patient denied any complaints at time of admission. Specifically she denied any weakness or numbness anywhere in the body, dysarthria, involuntary movements, bleeding from anywhere in the body. Patient does have chronic chest pain which she attributed to her going in and out of atrial fibrillation. The chest pain was not worse than usual and she denied any increased shortness of breath. The pain was described more as a soreness 2/10 in intensity. Patient had a similar episode of  transient loss of consciousness and fall about 2-3 years ago. She denied any dizziness at time of discharge, denied any shortness of breath, diaphoresis, nausea, vomiting. Patient was discharged 10 days prior from the hospital and was treated for healthcare associated pneumonia  Hospital Course:  Syncope: Most likely cause of patient's syncope vasovagal. Patient was finishing up using the bathroom and fell as she was trying to get up which is sort of classical for vasovagal. Pt admitted to tele for observation. No events on tele once pads changed. Troponin slightly elevated which chart review indicates chronic and could be related to leak secondary to heart failure. EKG without changes from previous. Chest xray with improved right pleural effusion.  Pt provided with IV fluids at admission. At discharge pt stable at baseline.    COPD: remained stable at baseline during this hospitalization   Atrial fibrillation: rate controlled during this hospitalization. Continue coumadin and BB.   Low back pain: related to fall. Exam benign. Will provide with pain med. Recommend follow up with PCP 1 week.      Procedures:    Consultations:  none  Discharge Exam: Filed Vitals:   02/20/13 0100 02/20/13 0156 02/20/13 0307 02/20/13 0541  BP: 100/55 132/71  116/74  Pulse: 74 72  73  Temp:  97.4 F (36.3 C)  97.4 F (36.3 C)  TempSrc:  Oral  Oral  Resp:      Height:      Weight: 106.9 kg (235 lb 10.8 oz)  SpO2: 97% 100% 97% 100%    General: awake obese NAD. Looking older than stated age Cardiovascular: RRR No MGR trace lower extremity edema PPP Respiratory: normal effort BS with fine crackles on left base otherwise clear.  Neuro: cranial nerve II-XII intact. Speech clear.  Bilateral lower extremity strength 5/5. Sensation intact.   Discharge Instructions  Discharge Orders   Future Orders Complete By Expires     Call MD for:  persistant dizziness or light-headedness  As directed     Call  MD for:  persistant nausea and vomiting  As directed     Diet - low sodium heart healthy  As directed     Increase activity slowly  As directed         Medication List    STOP taking these medications       HYDROcodone-acetaminophen 7.5-325 MG per tablet  Commonly known as:  NORCO      TAKE these medications       albuterol 108 (90 BASE) MCG/ACT inhaler  Commonly known as:  PROVENTIL HFA;VENTOLIN HFA  Inhale 2 puffs into the lungs every 6 (six) hours as needed.     atenolol 50 MG tablet  Commonly known as:  TENORMIN  Take 50 mg by mouth 2 (two) times daily.     atorvastatin 20 MG tablet  Commonly known as:  LIPITOR  Take 1 tablet (20 mg total) by mouth daily at 6 PM.     furosemide 80 MG tablet  Commonly known as:  LASIX  Take 2 tablets (160 mg total) by mouth daily.     HYDROcodone-acetaminophen 10-325 MG per tablet  Commonly known as:  NORCO  Take 1 tablet by mouth every 6 (six) hours as needed.     lisinopril 10 MG tablet  Commonly known as:  PRINIVIL  Take 0.5 tablets (5 mg total) by mouth daily.     potassium chloride 10 MEQ tablet  Commonly known as:  K-DUR  Take 2 tablets (20 mEq total) by mouth 2 (two) times daily.     SYMBICORT IN  Inhale 2 puffs into the lungs 2 (two) times daily.     tiotropium 18 MCG inhalation capsule  Commonly known as:  SPIRIVA  Place 18 mcg into inhaler and inhale daily.     warfarin 4 MG tablet  Commonly known as:  COUMADIN  Take 4 mg by mouth every evening.          The results of significant diagnostics from this hospitalization (including imaging, microbiology, ancillary and laboratory) are listed below for reference.    Significant Diagnostic Studies: Dg Chest 2 View  02/19/2013  *RADIOLOGY REPORT*  Clinical Data: Shortness of breath.  Syncope.  Fall.  CHEST - 2 VIEW  Comparison: Two-view chest x-ray 02/07/2013, 08/29/2012.  Findings: Cardiac silhouette moderately enlarged but stable.  Left subclavian dual lead  transvenous pacemaker unchanged appears intact.  Interval improvement in the right pleural effusion since the examination 12 days ago.  Improved aeration in the right lower lobe and right middle lobe, though airspace opacities persist.  No new pulmonary parenchymal abnormalities.  Pulmonary vascularity normal without evidence of pulmonary edema.  IMPRESSION: Stable cardiomegaly without pulmonary edema.  Significant improvement in the right pleural effusion since the examination 12 days ago.  Improved aeration in the right middle and lower lobes, though airspace opacities persist which may reflect atelectasis or pneumonia.  No new abnormalities.   Original Report Authenticated By: Hulan Saas, M.D.  Dg Chest 2 View  02/07/2013  *RADIOLOGY REPORT*  Clinical Data: Shortness of breath and congestion.  CHEST - 2 VIEW  Comparison: PA and lateral chest 08/29/2012.  Findings: The patient has a moderate right pleural effusion and dense right basilar airspace disease.  A small amount of fluid is seen in the minor fissure.  Left lung is clear.  No pneumothorax. Cardiomegaly is noted.  IMPRESSION:  1.  Right pleural effusion and basilar airspace disease worrisome for pneumonia.  Recommend follow-up to clearing. 2.  Cardiomegaly without edema.   Original Report Authenticated By: Holley Dexter, M.D.    Dg Thoracic Spine 2 View  02/19/2013  *RADIOLOGY REPORT*  Clinical Data: Syncope.  Mid back pain after a fall.  THORACIC SPINE - 2 VIEW  Comparison: None.  Findings: 12 rib-bearing thoracic vertebrae with anatomic alignment.  No fractures.  Mild mid and lower thoracic spondylosis. Slight thoracolumbar scoliosis convex right which may be in part positional.  Intact pedicles.  IMPRESSION: No acute osseous abnormality.  Mild mid and lower thoracic spondylosis.   Original Report Authenticated By: Hulan Saas, M.D.    US Venous Img Lower Bilateral  02/11/2013  *RADIOLOGY REPORT*  Clinical Data: Swelling, evaluate for  DVT.  VENOUS DUPLEX ULTRASOUND OF BILATERAL LOWER EXTREMITIES  Technique:  Gray-scale sonography with graded compression, as well as color Doppler and duplex ultrasound, were performed to evaluate the deep venous system of both lower extremities from the level of the common femoral vein through the popliteal and proximal calf veins.  Spectral Doppler was utilized to evaluate flow at rest and with distal augmentation maneuvers.  Comparison:  None.  Findings: The visualized bilateral lower extremity deep venous systems appear patent.  Normal compressibility.  Patent color Doppler flow.  Satisfactory spectral Doppler with respiratory variation and response to augmentation.  The greater saphenous vein, where visualized, is patent and compressible bilaterally.  IMPRESSION: No deep venous thrombosis in the visualized bilateral lower extremities.   Original Report Authenticated By: Charline Bills, M.D.         Labs: Basic Metabolic Panel:  Recent Labs Lab 02/19/13 2156 02/20/13 0415 02/20/13 1017  NA 143 142  --   K 3.5 3.0*  --   CL 103 103  --   CO2 30 29  --   GLUCOSE 102* 168*  --   BUN 7 7  --   CREATININE 0.72 0.66  --   CALCIUM 8.7 8.1*  --   MG  --   --  1.5   :    CBC:  Recent Labs Lab 02/19/13 2156 02/20/13 0415  WBC 13.6* 11.4*  NEUTROABS 8.2*  --   HGB 12.8 11.1*  HCT 38.9 34.3*  MCV 96.8 95.8  PLT 226 193   Cardiac Enzymes:  Recent Labs Lab 02/19/13 2156 02/20/13 0414 02/20/13 0946  TROPONINI <0.30 0.32* <0.30   BNP: BNP (last 3 results)  Recent Labs  02/07/13 2011 02/09/13 0523 02/10/13 0510  PROBNP 1566.0* 1588.0* 1325.0*         Signed:  Gwenyth Bender  [name] 02/20/2013, 10:49 AM Attending: Patient seen and examined, above note reviewed. The most likely cause of the patient's syncope was vasovagal event. Her other chronic medical problems are unchanged. She is stable for discharge.

## 2013-02-20 NOTE — Plan of Care (Signed)
Problem: Discharge Progression Outcomes Goal: Discharge plan in place and appropriate Outcome: Adequate for Discharge Pt d/c home with family Goal: Tolerating diet Outcome: Adequate for Discharge Pt on heart healthy diet Goal: Other Discharge Outcomes/Goals Outcome: Completed/Met Date Met:  02/20/13 Pt given d/c instructions pt verbalized understanding of instructions.

## 2013-02-20 NOTE — Progress Notes (Signed)
Patient had bad artifact telemetry readings per ED nurse.  ED Nurse stated if she had bad readings on 300 may want to replace pads.  Patient false readings of 32 runs on V-tach with staff in the room and patient asymptomatic at the time.  Once pads were replaced patient running vent-paced.

## 2013-02-20 NOTE — Progress Notes (Signed)
UR Chart Review Completed  

## 2013-02-20 NOTE — Progress Notes (Signed)
ANTICOAGULATION CONSULT NOTE   Pharmacy Consult for Coumadin Indication: AFIB  Allergies  Allergen Reactions  . Doxycycline     itch  . Nitroglycerin Other (See Comments)    CAUSED NUMBNESS ALL OVER  . Vesicare (Solifenacin)     Makes my sweat turn yellow   Patient Measurements: Height: 6' (182.9 cm) Weight: 235 lb 10.8 oz (106.9 kg) IBW/kg (Calculated) : 73.1  Vital Signs: Temp: 97.4 F (36.3 C) (03/17 0541) Temp src: Oral (03/17 0541) BP: 116/74 mmHg (03/17 0541) Pulse Rate: 73 (03/17 0541)  Labs:  Recent Labs  02/19/13 2156 02/20/13 0414 02/20/13 0415 02/20/13 0946 02/20/13 0947  HGB 12.8  --  11.1*  --   --   HCT 38.9  --  34.3*  --   --   PLT 226  --  193  --   --   LABPROT  --   --   --   --  15.3*  INR  --   --   --   --  1.23  CREATININE 0.72  --  0.66  --   --   TROPONINI <0.30 0.32*  --  <0.30  --    Estimated Creatinine Clearance: 117.6 ml/min (by C-G formula based on Cr of 0.66).  Medical History: Past Medical History  Diagnosis Date  . Pacemaker     vent paced  . COPD (chronic obstructive pulmonary disease)   . CHF (congestive heart failure)   . Thyroid disease   . Hepatitis     Hep C  . MI (myocardial infarction)   . Pneumonia   . Dysrhythmia     atrial fibrilation  . Cirrhosis   . Shortness of breath   . Diabetes mellitus   . Chest pain   . GERD (gastroesophageal reflux disease)   . Headache   . Anxiety   . A-fib    Medications:  Scheduled:  . albuterol  2.5 mg Nebulization Q6H  . antiseptic oral rinse  15 mL Mouth Rinse q12n4p  . atenolol  50 mg Oral BID  . atorvastatin  20 mg Oral q1800  . budesonide-formoterol  2 puff Inhalation BID  . chlorhexidine  15 mL Mouth Rinse BID  . enoxaparin (LOVENOX) injection  40 mg Subcutaneous Q24H  . furosemide  160 mg Oral Daily  . HYDROcodone-acetaminophen      . [COMPLETED] levofloxacin (LEVAQUIN) IV  750 mg Intravenous Once  . lisinopril  5 mg Oral Daily  . potassium chloride  20  mEq Oral BID  . potassium chloride  40 mEq Oral Once  . sodium chloride  3 mL Intravenous Q12H  . tiotropium  18 mcg Inhalation Daily  . warfarin  5 mg Oral Once  . Warfarin - Pharmacist Dosing Inpatient   Does not apply q1800   Assessment: 47yo female on Coumadin 4mg  daily for afib.  INR was sub-therapeutic on admission likely secondary to non-compliance.  No bleeding noted.     Goal of Therapy:  INR 2-3 Monitor platelets by anticoagulation protocol: Yes   Plan:  Coumadin 5mg  today x 1  INR/PT daily Lovenox 50mg  SQ daily until INR therapeutic (adjusted for BMI) Monitor CBC for platelets  Valrie Hart, PharmD 02/20/2013,11:01 AM

## 2013-02-20 NOTE — ED Notes (Signed)
Fully alert and oriented in no acute distress, to room via stretcher.

## 2013-02-20 NOTE — Progress Notes (Signed)
CRITICAL VALUE ALERT  Critical value received:  Troponin 0.32  Date of notification:  02/20/13  Time of notification:  0620  Critical value read back: yes  Nurse who received alert:  Foye Deer RN  MD notified (1st page):  Dr. Eben Burow   Time of first page:  0627  MD notified (2nd page): Dr. Eben Burow  Time of second page:  [phone]   No response or new orders at this time  Responding MD:    Time MD responded:

## 2013-03-01 ENCOUNTER — Emergency Department (HOSPITAL_COMMUNITY): Payer: Medicaid Other

## 2013-03-01 ENCOUNTER — Encounter (HOSPITAL_COMMUNITY): Payer: Self-pay | Admitting: *Deleted

## 2013-03-01 ENCOUNTER — Emergency Department (HOSPITAL_COMMUNITY)
Admission: EM | Admit: 2013-03-01 | Discharge: 2013-03-02 | Disposition: A | Discharge status: Home / Self Care | Payer: Medicaid Other | Attending: Emergency Medicine | Admitting: Emergency Medicine

## 2013-03-01 DIAGNOSIS — Z950 Presence of cardiac pacemaker: Secondary | ICD-10-CM | POA: Insufficient documentation

## 2013-03-01 DIAGNOSIS — Z79899 Other long term (current) drug therapy: Secondary | ICD-10-CM | POA: Insufficient documentation

## 2013-03-01 DIAGNOSIS — J209 Acute bronchitis, unspecified: Secondary | ICD-10-CM | POA: Insufficient documentation

## 2013-03-01 DIAGNOSIS — Z7901 Long term (current) use of anticoagulants: Secondary | ICD-10-CM | POA: Insufficient documentation

## 2013-03-01 DIAGNOSIS — Z8639 Personal history of other endocrine, nutritional and metabolic disease: Secondary | ICD-10-CM | POA: Insufficient documentation

## 2013-03-01 DIAGNOSIS — I509 Heart failure, unspecified: Secondary | ICD-10-CM | POA: Insufficient documentation

## 2013-03-01 DIAGNOSIS — R0600 Dyspnea, unspecified: Secondary | ICD-10-CM

## 2013-03-01 DIAGNOSIS — J440 Chronic obstructive pulmonary disease with acute lower respiratory infection: Secondary | ICD-10-CM | POA: Insufficient documentation

## 2013-03-01 DIAGNOSIS — Z8679 Personal history of other diseases of the circulatory system: Secondary | ICD-10-CM | POA: Insufficient documentation

## 2013-03-01 DIAGNOSIS — Z862 Personal history of diseases of the blood and blood-forming organs and certain disorders involving the immune mechanism: Secondary | ICD-10-CM | POA: Insufficient documentation

## 2013-03-01 DIAGNOSIS — E669 Obesity, unspecified: Secondary | ICD-10-CM | POA: Insufficient documentation

## 2013-03-01 DIAGNOSIS — I252 Old myocardial infarction: Secondary | ICD-10-CM | POA: Insufficient documentation

## 2013-03-01 DIAGNOSIS — F411 Generalized anxiety disorder: Secondary | ICD-10-CM | POA: Insufficient documentation

## 2013-03-01 DIAGNOSIS — IMO0002 Reserved for concepts with insufficient information to code with codable children: Secondary | ICD-10-CM | POA: Insufficient documentation

## 2013-03-01 DIAGNOSIS — E119 Type 2 diabetes mellitus without complications: Secondary | ICD-10-CM | POA: Insufficient documentation

## 2013-03-01 DIAGNOSIS — Z8619 Personal history of other infectious and parasitic diseases: Secondary | ICD-10-CM | POA: Insufficient documentation

## 2013-03-01 DIAGNOSIS — Z8701 Personal history of pneumonia (recurrent): Secondary | ICD-10-CM | POA: Insufficient documentation

## 2013-03-01 DIAGNOSIS — F17200 Nicotine dependence, unspecified, uncomplicated: Secondary | ICD-10-CM | POA: Insufficient documentation

## 2013-03-01 DIAGNOSIS — Z8719 Personal history of other diseases of the digestive system: Secondary | ICD-10-CM | POA: Insufficient documentation

## 2013-03-01 LAB — CBC WITH DIFFERENTIAL/PLATELET
Eosinophils Absolute: 0.2 10*3/uL (ref 0.0–0.7)
Lymphs Abs: 1.7 10*3/uL (ref 0.7–4.0)
MCH: 31.4 pg (ref 26.0–34.0)
Neutrophils Relative %: 66 % (ref 43–77)
Platelets: 211 10*3/uL (ref 150–400)
RBC: 4.04 MIL/uL (ref 3.87–5.11)
WBC: 7.2 10*3/uL (ref 4.0–10.5)

## 2013-03-01 LAB — BASIC METABOLIC PANEL
GFR calc Af Amer: >90 mL/min (ref >90)
GFR calc non Af Amer: >90 mL/min (ref >90)
Glucose, Bld: 112 mg/dL — ABNORMAL HIGH (ref 70–99)
Potassium: 3.2 mEq/L — ABNORMAL LOW (ref 3.5–5.1)
Sodium: 139 mEq/L (ref 135–145)

## 2013-03-01 LAB — PRO B NATRIURETIC PEPTIDE: Pro B Natriuretic peptide (BNP): 1358 pg/mL — ABNORMAL HIGH (ref 0–125)

## 2013-03-01 MED ORDER — MORPHINE SULFATE 4 MG/ML IJ SOLN
4.0000 mg | Freq: Once | INTRAMUSCULAR | Status: AC
  Administered 2013-03-01: 4 mg via INTRAVENOUS
  Filled 2013-03-01: qty 1

## 2013-03-01 MED ORDER — IPRATROPIUM BROMIDE 0.02 % IN SOLN
0.5000 mg | Freq: Once | RESPIRATORY_TRACT | Status: AC
  Administered 2013-03-01: 0.5 mg via RESPIRATORY_TRACT
  Filled 2013-03-01: qty 2.5

## 2013-03-01 MED ORDER — ONDANSETRON HCL 4 MG/2ML IJ SOLN
4.0000 mg | Freq: Once | INTRAMUSCULAR | Status: AC
  Administered 2013-03-01: 4 mg via INTRAVENOUS
  Filled 2013-03-01: qty 2

## 2013-03-01 MED ORDER — ALBUTEROL SULFATE (5 MG/ML) 0.5% IN NEBU
2.5000 mg | INHALATION_SOLUTION | Freq: Once | RESPIRATORY_TRACT | Status: AC
  Administered 2013-03-01: 2.5 mg via RESPIRATORY_TRACT
  Filled 2013-03-01: qty 0.5

## 2013-03-01 NOTE — ED Notes (Signed)
Pt reporting SOB for several days.  Reporting history of COPD, states that she gets SOB with activity.  Expiratory wheezing noted upon auscultation.  Respiratory therapy notified for nebulizer treatment.

## 2013-03-01 NOTE — ED Notes (Signed)
Short of breath for the past 2 weeks

## 2013-03-01 NOTE — ED Notes (Signed)
Pt also states she is now having stomach pain

## 2013-03-01 NOTE — ED Provider Notes (Signed)
History     This chart was scribed for Donnetta Hutching, MD, MD by Smitty Pluck, ED Scribe. The patient was seen in room APA18/APA18 and the patient's care was started at 10:08 PM.   CSN: [id number]  Arrival date & time 03/01/13  2108      Chief Complaint  Patient presents with  . Shortness of Breath     The history is provided by the patient. No language interpreter was used.   Kim [name] is a 46 y.o. female with hx of COPD, CHF, MI, DM, HTN, atrial fib, cirrhosis, Hep C, heart ablations  and pacemaker who presents to the Emergency Department complaining of constant, moderate SOB onset 5 days ago. She reports having weakness in bilateral legs. She reports that she can walk but she hops due to weakness in her legs. She states that she can walk to about 10 ft and she has SOB. She has cough that has been ongoing. Pt denies fever, chills, nausea, vomiting, diarrhea, weakness and any other pain.  PCP is Dr. Janne Napoleon in [place], [place]  Cardiologist is at [facility]   Past Medical History  Diagnosis Date  . Pacemaker     vent paced  . COPD (chronic obstructive pulmonary disease)   . CHF (congestive heart failure)   . Thyroid disease   . Hepatitis     Hep C  . MI (myocardial infarction)   . Pneumonia   . Dysrhythmia     atrial fibrilation  . Cirrhosis   . Shortness of breath   . Diabetes mellitus   . Chest pain   . GERD (gastroesophageal reflux disease)   . Headache   . Anxiety   . A-fib     Past Surgical History  Procedure Laterality Date  . Cholecystectomy    . Pacemaker insertion    . Insert / replace / remove pacemaker      02/2012    Family History  Problem Relation Age of Onset  . Coronary artery disease Father 44  . Coronary artery disease Mother 62  . Coronary artery disease Brother     History  Substance Use Topics  . Smoking status: Current Every Day Smoker -- 0.50 packs/day for 30 years    Types: Cigarettes  . Smokeless tobacco: Current User    Types: Snuff  .  Alcohol Use: No    OB History   Grav Para Term Preterm Abortions TAB SAB Ect Mult Living                  Review of Systems 10 Systems reviewed and all are negative for acute change except as noted in the HPI.   Allergies  Doxycycline; Nitroglycerin; and Vesicare  Home Medications   Current Outpatient Rx  Name  Route  Sig  Dispense  Refill  . albuterol (PROVENTIL HFA;VENTOLIN HFA) 108 (90 BASE) MCG/ACT inhaler   Inhalation   Inhale 2 puffs into the lungs every 6 (six) hours as needed.         Marland Kitchen atenolol (TENORMIN) 50 MG tablet   Oral   Take 50 mg by mouth 2 (two) times daily.         Marland Kitchen atorvastatin (LIPITOR) 20 MG tablet   Oral   Take 1 tablet (20 mg total) by mouth daily at 6 PM.   30 tablet   1   . Budesonide-Formoterol Fumarate (SYMBICORT IN)   Inhalation   Inhale 2 puffs into the lungs 2 (two) times daily.         Marland Kitchen  furosemide (LASIX) 80 MG tablet   Oral   Take 2 tablets (160 mg total) by mouth daily.   60 tablet   1     This is a Month's supply   . HYDROcodone-acetaminophen (NORCO) 10-325 MG per tablet   Oral   Take 1 tablet by mouth every 6 (six) hours as needed.   15 tablet   0   . lisinopril (PRINIVIL) 10 MG tablet   Oral   Take 0.5 tablets (5 mg total) by mouth daily.   30 tablet   1   . potassium chloride (K-DUR) 10 MEQ tablet   Oral   Take 2 tablets (20 mEq total) by mouth 2 (two) times daily.   60 tablet   1   . tiotropium (SPIRIVA) 18 MCG inhalation capsule   Inhalation   Place 18 mcg into inhaler and inhale daily.         Marland Kitchen warfarin (COUMADIN) 4 MG tablet   Oral   Take 4 mg by mouth every evening.           SpO2 98%  LMP 02/08/2007  Physical Exam  Nursing note and vitals reviewed. Constitutional: She is oriented to person, place, and time. She appears well-developed and well-nourished.  Obese   HENT:  Head: Normocephalic and atraumatic.  Eyes: Conjunctivae and EOM are normal. Pupils are equal, round, and  reactive to light.  Neck: Normal range of motion. Neck supple.  Cardiovascular: Normal rate, regular rhythm and normal heart sounds.   Pulmonary/Chest: Effort normal and breath sounds normal.  Abdominal: Soft. Bowel sounds are normal.  Musculoskeletal: Normal range of motion.  Neurological: She is alert and oriented to person, place, and time.  Skin: Skin is warm and dry.  Psychiatric: She has a normal mood and affect.    ED Course  Procedures (including critical care time) DIAGNOSTIC STUDIES: Oxygen Saturation is 98% on Wallace, normal by my interpretation.    COORDINATION OF CARE: 10:11 PM Discussed ED treatment with pt and pt agrees to chest xray and labs.     Labs Reviewed - No data to display No results found.   No diagnosis found.   Date: 03/01/2013  Rate: 85  Rhythm: ventricular paced rhythm  QRS Axis: normal  Intervals: normal  ST/T Wave abnormalities: normal  Conduction Disutrbances:none  Narrative Interpretation:   Old EKG Reviewed: none available Dg Chest 2 View  03/01/2013  *RADIOLOGY REPORT*  Clinical Data: Short of breath.  CHEST - 2 VIEW  Comparison: 02/19/2013.  Findings: Linear subsegmental atelectasis or scarring remains present over the right hemidiaphragm extending to the right costophrenic angle.  There is a small right pleural effusion which layers posteriorly.  Associated atelectasis.  Cardiomegaly remains present.  Dual lead left subclavian cardiac pacemaker.  Mediastinal contours appear unchanged.  IMPRESSION:  1.  Cardiomegaly without convincing evidence of failure. 2.  Subsegmental atelectasis at the right lung base with small right pleural effusion.  Follow-up to ensure resolution is recommended.   Original Report Authenticated By: Andreas Newport, M.D.     MDM  Mild dyspnea.  NAD.  cxr shows right pleural effusion.  Labs pending.  Discussed c Dr Judd Lien      I personally performed the services described in this documentation, which was scribed in my  presence. The recorded information has been reviewed and is accurate.     Donnetta Hutching, MD 03/04/13 [phone]

## 2013-03-01 NOTE — ED Notes (Signed)
Pt reporting some improvement in SOB since receiving breathing treatment.

## 2013-03-01 NOTE — ED Notes (Signed)
CRITICAL VALUE ALERT  Critical value received:  Troponin 0.47  Date of notification:  03/01/13  Time of notification:  2348  Critical value read back:yes  Nurse who received alert:  BKN  MD notified (1st page):  Dr Adriana Simas  Time of first page:  2348  MD notified (2nd page):  Time of second page:  Responding MD:    Time MD responded:

## 2013-03-02 LAB — TROPONIN I: Troponin I: 0.41 ng/mL (ref <0.30)

## 2013-03-02 MED ORDER — OXYCODONE HCL 10 MG PO TABS: 10.0000 mg | ORAL_TABLET | Freq: Four times a day (QID) | ORAL | Status: DC | PRN

## 2013-03-02 NOTE — ED Notes (Signed)
CRITICAL VALUE ALERT  Critical value received: Troponin 0.41  Date of notification:  03/02/13  Time of notification:  0118  Critical value read back:yes  Nurse who received alert:  BKN  MD notified (1st page):  Dr Judd Lien  Time of first page:  0119  MD notified (2nd page):  Time of second page:  Responding MD:    Time MD responded:

## 2013-03-02 NOTE — ED Provider Notes (Signed)
Care assumed from Dr. Adriana Simas at shift change.  The patient has an extensive past medical history including pacemaker, cardiomyopathy with reduced EF.  She presented here with shortness of breath.  The workup is essentially unremarkable with the exception of a mildly elevated troponin and bnp.  It does not appear as though she has been compliant with her diuretics in the past and it appears as though this is the case again.    I reviewed the chart.  There have been two prior admissions in the past month in which the troponin was elevated, but not felt to be significant.  Tonight's troponin has been repeated and is has decreased.  She appears clinically well and I believe she is appropriate for discharge.  She was advised to take her medications as prescribed and to see her pcp this week for follow up.  Geoffery Lyons, MD 03/02/13 [phone]

## 2013-03-05 ENCOUNTER — Emergency Department (HOSPITAL_COMMUNITY)
Admission: EM | Admit: 2013-03-05 | Discharge: 2013-03-05 | Disposition: A | Discharge status: Home / Self Care | Payer: Medicaid Other | Attending: Emergency Medicine | Admitting: Emergency Medicine

## 2013-03-05 ENCOUNTER — Encounter (HOSPITAL_COMMUNITY): Payer: Self-pay | Admitting: Emergency Medicine

## 2013-03-05 ENCOUNTER — Emergency Department (HOSPITAL_COMMUNITY): Payer: Medicaid Other

## 2013-03-05 DIAGNOSIS — Z8619 Personal history of other infectious and parasitic diseases: Secondary | ICD-10-CM | POA: Insufficient documentation

## 2013-03-05 DIAGNOSIS — F17200 Nicotine dependence, unspecified, uncomplicated: Secondary | ICD-10-CM | POA: Insufficient documentation

## 2013-03-05 DIAGNOSIS — R0609 Other forms of dyspnea: Secondary | ICD-10-CM | POA: Insufficient documentation

## 2013-03-05 DIAGNOSIS — Z862 Personal history of diseases of the blood and blood-forming organs and certain disorders involving the immune mechanism: Secondary | ICD-10-CM | POA: Insufficient documentation

## 2013-03-05 DIAGNOSIS — Z8701 Personal history of pneumonia (recurrent): Secondary | ICD-10-CM | POA: Insufficient documentation

## 2013-03-05 DIAGNOSIS — R0989 Other specified symptoms and signs involving the circulatory and respiratory systems: Secondary | ICD-10-CM | POA: Insufficient documentation

## 2013-03-05 DIAGNOSIS — Z8719 Personal history of other diseases of the digestive system: Secondary | ICD-10-CM | POA: Insufficient documentation

## 2013-03-05 DIAGNOSIS — I509 Heart failure, unspecified: Secondary | ICD-10-CM | POA: Insufficient documentation

## 2013-03-05 DIAGNOSIS — Z8679 Personal history of other diseases of the circulatory system: Secondary | ICD-10-CM | POA: Insufficient documentation

## 2013-03-05 DIAGNOSIS — Z8639 Personal history of other endocrine, nutritional and metabolic disease: Secondary | ICD-10-CM | POA: Insufficient documentation

## 2013-03-05 DIAGNOSIS — R0600 Dyspnea, unspecified: Secondary | ICD-10-CM

## 2013-03-05 DIAGNOSIS — J449 Chronic obstructive pulmonary disease, unspecified: Secondary | ICD-10-CM | POA: Insufficient documentation

## 2013-03-05 DIAGNOSIS — Z79899 Other long term (current) drug therapy: Secondary | ICD-10-CM | POA: Insufficient documentation

## 2013-03-05 DIAGNOSIS — Z7901 Long term (current) use of anticoagulants: Secondary | ICD-10-CM | POA: Insufficient documentation

## 2013-03-05 DIAGNOSIS — Z950 Presence of cardiac pacemaker: Secondary | ICD-10-CM | POA: Insufficient documentation

## 2013-03-05 DIAGNOSIS — J4489 Other specified chronic obstructive pulmonary disease: Secondary | ICD-10-CM | POA: Insufficient documentation

## 2013-03-05 DIAGNOSIS — I252 Old myocardial infarction: Secondary | ICD-10-CM | POA: Insufficient documentation

## 2013-03-05 DIAGNOSIS — E119 Type 2 diabetes mellitus without complications: Secondary | ICD-10-CM | POA: Insufficient documentation

## 2013-03-05 DIAGNOSIS — Z8659 Personal history of other mental and behavioral disorders: Secondary | ICD-10-CM | POA: Insufficient documentation

## 2013-03-05 LAB — BASIC METABOLIC PANEL
BUN: 5 mg/dL — ABNORMAL LOW (ref 6–23)
CO2: 28 mEq/L (ref 19–32)
Calcium: 9.2 mg/dL (ref 8.4–10.5)
Chloride: 100 mEq/L (ref 96–112)
Creatinine, Ser: 0.73 mg/dL (ref 0.50–1.10)
GFR calc Af Amer: >90 mL/min (ref >90)
GFR calc non Af Amer: >90 mL/min (ref >90)
Glucose, Bld: 151 mg/dL — ABNORMAL HIGH (ref 70–99)
Potassium: 4 mEq/L (ref 3.5–5.1)
Sodium: 139 mEq/L (ref 135–145)

## 2013-03-05 LAB — CBC
HCT: 38.9 % (ref 36.0–46.0)
Hemoglobin: 12.8 g/dL (ref 12.0–15.0)
MCH: 31.6 pg (ref 26.0–34.0)
MCHC: 32.9 g/dL (ref 30.0–36.0)
MCV: 96 fL (ref 78.0–100.0)
Platelets: 246 K/uL (ref 150–400)
RBC: 4.05 MIL/uL (ref 3.87–5.11)
RDW: 16.4 % — ABNORMAL HIGH (ref 11.5–15.5)
WBC: 6.4 K/uL (ref 4.0–10.5)

## 2013-03-05 LAB — PRO B NATRIURETIC PEPTIDE: Pro B Natriuretic peptide (BNP): 1327 pg/mL — ABNORMAL HIGH (ref 0–125)

## 2013-03-05 LAB — TROPONIN I: Troponin I: 0.37 ng/mL (ref <0.30)

## 2013-03-05 LAB — GLUCOSE, CAPILLARY: Glucose-Capillary: 140 mg/dL — ABNORMAL HIGH (ref 70–99)

## 2013-03-05 MED ORDER — ALBUTEROL SULFATE (5 MG/ML) 0.5% IN NEBU
5.0000 mg | INHALATION_SOLUTION | Freq: Once | RESPIRATORY_TRACT | Status: AC
  Administered 2013-03-05: 5 mg via RESPIRATORY_TRACT
  Filled 2013-03-05: qty 1

## 2013-03-05 MED ORDER — ONDANSETRON HCL 4 MG/2ML IJ SOLN: 4.0000 mg | Freq: Once | INTRAMUSCULAR | Status: DC

## 2013-03-05 MED ORDER — IPRATROPIUM BROMIDE 0.02 % IN SOLN
0.5000 mg | Freq: Once | RESPIRATORY_TRACT | Status: AC
  Administered 2013-03-05: 0.5 mg via RESPIRATORY_TRACT
  Filled 2013-03-05: qty 2.5

## 2013-03-05 MED ORDER — ONDANSETRON 4 MG PO TBDP
4.0000 mg | ORAL_TABLET | Freq: Once | ORAL | Status: AC
  Administered 2013-03-05: 4 mg via ORAL
  Filled 2013-03-05: qty 1

## 2013-03-05 NOTE — ED Notes (Signed)
Pt with SOB, productive cough of brown and blood per pt., c/o abd pain

## 2013-03-05 NOTE — ED Provider Notes (Signed)
History     This chart was scribed for Raeford Razor, MD, MD by Smitty Pluck, ED Scribe. The patient was seen in room APA05/APA05 and the patient's care was started at 5:13 PM.   CSN: [id number]  Arrival date & time 03/05/13  1647      Chief Complaint  Patient presents with  . Shortness of Breath    The history is provided by the patient. No language interpreter was used.  Pt is a poor historian. Kim [name] is a 47 y.o. female with hx of COPD, CHF, MI, pacemaker, DM, cirrhosis and atrial fib who presents to the Emergency Department complaining of constant, moderate SOB onset 3 days ago. She reports SOB is aggravated by walking and exertion. She reports having associated productive cough with brown sputum, chills, chronic lower abdominal pain and chronic lower back pain. She reports using 2L of O2 at home all the time and she uses CPAP at night. She was seen in ED 4 days ago for same symptoms and sent home with steroids but states she has not had relief after using steroids. She reports having hematuria.   Dr. Ermalinda Barrios is Cardiologist Dr. Janne Napoleon is PCP   Past Medical History  Diagnosis Date  . Pacemaker     vent paced  . COPD (chronic obstructive pulmonary disease)   . CHF (congestive heart failure)   . Thyroid disease   . Hepatitis     Hep C  . MI (myocardial infarction)   . Pneumonia   . Dysrhythmia     atrial fibrilation  . Cirrhosis   . Shortness of breath   . Diabetes mellitus   . Chest pain   . GERD (gastroesophageal reflux disease)   . Headache   . Anxiety   . A-fib     Past Surgical History  Procedure Laterality Date  . Cholecystectomy    . Pacemaker insertion    . Insert / replace / remove pacemaker      02/2012    Family History  Problem Relation Age of Onset  . Coronary artery disease Father 4  . Coronary artery disease Mother 53  . Coronary artery disease Brother     History  Substance Use Topics  . Smoking status: Current Every Day Smoker --  0.50 packs/day for 30 years    Types: Cigarettes  . Smokeless tobacco: Current User    Types: Snuff  . Alcohol Use: No    OB History   Grav Para Term Preterm Abortions TAB SAB Ect Mult Living                  Review of Systems  All other systems reviewed and are negative.    Allergies  Doxycycline; Nitroglycerin; and Vesicare  Home Medications   Current Outpatient Rx  Name  Route  Sig  Dispense  Refill  . albuterol (PROVENTIL HFA;VENTOLIN HFA) 108 (90 BASE) MCG/ACT inhaler   Inhalation   Inhale 2 puffs into the lungs every 6 (six) hours as needed for wheezing or shortness of breath.          Marland Kitchen atenolol (TENORMIN) 50 MG tablet   Oral   Take 50 mg by mouth 2 (two) times daily.         . Budesonide-Formoterol Fumarate (SYMBICORT IN)   Inhalation   Inhale 2 puffs into the lungs 2 (two) times daily.         . Oxycodone HCl 10 MG TABS   Oral  Take 1 tablet (10 mg total) by mouth every 6 (six) hours as needed.   12 tablet   0   . tiotropium (SPIRIVA) 18 MCG inhalation capsule   Inhalation   Place 18 mcg into inhaler and inhale daily.         Marland Kitchen warfarin (COUMADIN) 4 MG tablet   Oral   Take 4 mg by mouth every evening.         Marland Kitchen atorvastatin (LIPITOR) 20 MG tablet   Oral   Take 1 tablet (20 mg total) by mouth daily at 6 PM.   30 tablet   1   . furosemide (LASIX) 80 MG tablet   Oral   Take 2 tablets (160 mg total) by mouth daily.   60 tablet   1     This is a Month's supply   . potassium chloride (K-DUR) 10 MEQ tablet   Oral   Take 2 tablets (20 mEq total) by mouth 2 (two) times daily.   60 tablet   1     BP 157/90  Pulse 74  Temp(Src) 97.3 F (36.3 C) (Oral)  Resp 20  Ht 5\' 6"  (1.676 m)  Wt 230 lb (104.327 kg)  BMI 37.14 kg/m2  SpO2 100%  LMP 02/08/2007  Physical Exam  Nursing note and vitals reviewed. Constitutional: She appears well-developed and well-nourished. No distress.  Obese   HENT:  Head: Normocephalic and  atraumatic.  Eyes: Conjunctivae are normal. Right eye exhibits no discharge. Left eye exhibits no discharge.  Neck: Neck supple.  Cardiovascular: Normal rate, regular rhythm and normal heart sounds.  Exam reveals no gallop and no friction rub.   No murmur heard. Pulmonary/Chest: Effort normal. No respiratory distress. She has wheezes.  Speaking in complete sentences Faint bilateral expiratory wheezes    Abdominal: Soft. She exhibits no distension. There is tenderness (diffuse). There is no rebound and no guarding.  Musculoskeletal: She exhibits no edema and no tenderness.  Symmetric lower extremities   Neurological: She is alert.  Skin: Skin is warm and dry.  Psychiatric: She has a normal mood and affect. Her behavior is normal. Thought content normal.    ED Course  Procedures (including critical care time) DIAGNOSTIC STUDIES: Oxygen Saturation is 100% on room air, normal by my interpretation.    COORDINATION OF CARE: 5:17 PM Discussed ED treatment with pt and pt agrees. , 5:17 PM Ordered:  Medications  ondansetron (ZOFRAN) injection 4 mg (not administered)  albuterol (PROVENTIL) (5 MG/ML) 0.5% nebulizer solution 5 mg (5 mg Nebulization Given 03/05/13 1757)  ipratropium (ATROVENT) nebulizer solution 0.5 mg (0.5 mg Nebulization Given 03/05/13 1757)    6:45 PM Recheck: Pt states she still has SOB with exertion and nausea. Will order more Zofran. Discussed labs and xray results. Discussed recommended treatment course post  ED visit.     Labs Reviewed  GLUCOSE, CAPILLARY - Abnormal; Notable for the following:    Glucose-Capillary 140 (*)    All other components within normal limits  TROPONIN I  PRO B NATRIURETIC PEPTIDE  CBC  BASIC METABOLIC PANEL   Dg Chest 2 View  03/05/2013  *RADIOLOGY REPORT*  Clinical Data: Short of breath  CHEST - 2 VIEW  Comparison: 03/01/2013  Findings: Moderate cardiomegaly.  Vascular congestion without edema.  Stable dual lead left subclavian  pacemaker device.  Mild patchy density at the left base is improved.  Right lung is clear. No pneumothorax.  IMPRESSION: Cardiomegaly and vascular congestion.  Mild patchy atelectasis  verses airspace disease at the left base.   Original Report Authenticated By: Jolaine Click, M.D.    EKG:  Rhythm: v-paced Rate: ~70  Intervals: paced Axis: Left ST segments: NS ST changes    1. Dyspnea       MDM  46yF with dyspnea. Clinically actually pretty well appearing. No increased WOB. o2 sats good. Elevated trop but pt with hx of persistent mild elevation and decreased from 3d ago. Doubt infectious or PE.     I personally preformed the services scribed in my presence. The recorded information has been reviewed is accurate. Raeford Razor, MD.    Raeford Razor, MD 03/08/13 2213

## 2013-03-05 NOTE — ED Notes (Signed)
Pt states that she wears home O2 2L/M via [place], RA sats 94-95%

## 2013-03-05 NOTE — ED Notes (Signed)
Cough and sob x 3 days. Brown prod cough . No resp distress noted.

## 2013-03-14 ENCOUNTER — Emergency Department (HOSPITAL_COMMUNITY)
Admission: EM | Admit: 2013-03-14 | Discharge: 2013-03-14 | Disposition: A | Discharge status: Home / Self Care | Payer: Medicaid Other | Attending: Emergency Medicine | Admitting: Emergency Medicine

## 2013-03-14 ENCOUNTER — Encounter (HOSPITAL_COMMUNITY): Payer: Self-pay | Admitting: *Deleted

## 2013-03-14 ENCOUNTER — Emergency Department (HOSPITAL_COMMUNITY): Payer: Medicaid Other

## 2013-03-14 DIAGNOSIS — S39012A Strain of muscle, fascia and tendon of lower back, initial encounter: Secondary | ICD-10-CM

## 2013-03-14 DIAGNOSIS — Z950 Presence of cardiac pacemaker: Secondary | ICD-10-CM | POA: Insufficient documentation

## 2013-03-14 DIAGNOSIS — Y9301 Activity, walking, marching and hiking: Secondary | ICD-10-CM | POA: Insufficient documentation

## 2013-03-14 DIAGNOSIS — Z7901 Long term (current) use of anticoagulants: Secondary | ICD-10-CM | POA: Insufficient documentation

## 2013-03-14 DIAGNOSIS — I509 Heart failure, unspecified: Secondary | ICD-10-CM | POA: Insufficient documentation

## 2013-03-14 DIAGNOSIS — S43499A Other sprain of unspecified shoulder joint, initial encounter: Secondary | ICD-10-CM | POA: Insufficient documentation

## 2013-03-14 DIAGNOSIS — I252 Old myocardial infarction: Secondary | ICD-10-CM | POA: Insufficient documentation

## 2013-03-14 DIAGNOSIS — J449 Chronic obstructive pulmonary disease, unspecified: Secondary | ICD-10-CM | POA: Insufficient documentation

## 2013-03-14 DIAGNOSIS — S335XXA Sprain of ligaments of lumbar spine, initial encounter: Secondary | ICD-10-CM | POA: Insufficient documentation

## 2013-03-14 DIAGNOSIS — Z8679 Personal history of other diseases of the circulatory system: Secondary | ICD-10-CM | POA: Insufficient documentation

## 2013-03-14 DIAGNOSIS — Z8709 Personal history of other diseases of the respiratory system: Secondary | ICD-10-CM | POA: Insufficient documentation

## 2013-03-14 DIAGNOSIS — B1920 Unspecified viral hepatitis C without hepatic coma: Secondary | ICD-10-CM | POA: Insufficient documentation

## 2013-03-14 DIAGNOSIS — S46912A Strain of unspecified muscle, fascia and tendon at shoulder and upper arm level, left arm, initial encounter: Secondary | ICD-10-CM

## 2013-03-14 DIAGNOSIS — Z8701 Personal history of pneumonia (recurrent): Secondary | ICD-10-CM | POA: Insufficient documentation

## 2013-03-14 DIAGNOSIS — I4891 Unspecified atrial fibrillation: Secondary | ICD-10-CM | POA: Insufficient documentation

## 2013-03-14 DIAGNOSIS — W010XXA Fall on same level from slipping, tripping and stumbling without subsequent striking against object, initial encounter: Secondary | ICD-10-CM | POA: Insufficient documentation

## 2013-03-14 DIAGNOSIS — E119 Type 2 diabetes mellitus without complications: Secondary | ICD-10-CM | POA: Insufficient documentation

## 2013-03-14 DIAGNOSIS — J4489 Other specified chronic obstructive pulmonary disease: Secondary | ICD-10-CM | POA: Insufficient documentation

## 2013-03-14 DIAGNOSIS — Y92009 Unspecified place in unspecified non-institutional (private) residence as the place of occurrence of the external cause: Secondary | ICD-10-CM | POA: Insufficient documentation

## 2013-03-14 DIAGNOSIS — E079 Disorder of thyroid, unspecified: Secondary | ICD-10-CM | POA: Insufficient documentation

## 2013-03-14 MED ORDER — HYDROCODONE-ACETAMINOPHEN 7.5-325 MG/15ML PO SOLN
10.0000 mL | Freq: Once | ORAL | Status: AC
  Administered 2013-03-14: 10 mL via ORAL
  Filled 2013-03-14: qty 15

## 2013-03-14 MED ORDER — OXYCODONE HCL 10 MG PO TABS: 10.0000 mg | ORAL_TABLET | Freq: Four times a day (QID) | ORAL | Status: DC | PRN

## 2013-03-14 NOTE — ED Notes (Signed)
Fell on wet floor pain rt shoulder and back, No LOC.  Alert, talking,

## 2013-03-14 NOTE — ED Notes (Signed)
nad noted prior to dc. Dc instructions reviewed and 1 script given prior to dc. Ambulated out without difficulty.

## 2013-03-14 NOTE — ED Provider Notes (Signed)
History     CSN: [id number]  Arrival date & time 03/14/13  1634   First MD Initiated Contact with Patient 03/14/13 1712      Chief Complaint  Patient presents with  . Fall    (Consider location/radiation/quality/duration/timing/severity/associated sxs/prior treatment) HPI Comments: Kim [name] is a 47 y.o. Female presenting with pain in her left shoulder and her lumbar area since falling last night on wet floor in her home.  She denies hitting her head and denies loc.  She has taken tylenol without relief of her pain.  Her pain is constant and aching, but sharp in the left shoulder with movement,  Sore when she rubs the area and pain in the lower back is also worse with movement and walking.   There is no radiation into the  lower extremities.  There has been no weakness or numbness in the lower extremities and no urinary or bowel retention or incontinence. She reports being diagnosed with "weakened bones" and when asked if she has osteoporosis,  She is unsure.  She is concerned for possible fractures at her injury sites.  Of note, she is on chronic coumadin due to history of afib.  Her pt was checked today by her home health nurse and was  "2. Something" which patient states is better than it normally runs.  No medication adjustments were made.    The history is provided by the patient and a relative.    Past Medical History  Diagnosis Date  . Pacemaker     vent paced  . COPD (chronic obstructive pulmonary disease)   . CHF (congestive heart failure)   . Thyroid disease   . Hepatitis     Hep C  . MI (myocardial infarction)   . Pneumonia   . Dysrhythmia     atrial fibrilation  . Cirrhosis   . Shortness of breath   . Diabetes mellitus   . Chest pain   . GERD (gastroesophageal reflux disease)   . Headache   . Anxiety   . A-fib     Past Surgical History  Procedure Laterality Date  . Cholecystectomy    . Pacemaker insertion    . Insert / replace / remove pacemaker     02/2012    Family History  Problem Relation Age of Onset  . Coronary artery disease Father 4  . Coronary artery disease Mother 25  . Coronary artery disease Brother     History  Substance Use Topics  . Smoking status: Current Every Day Smoker -- 0.50 packs/day for 30 years    Types: Cigarettes  . Smokeless tobacco: Current User    Types: Snuff  . Alcohol Use: No    OB History   Grav Para Term Preterm Abortions TAB SAB Ect Mult Living                  Review of Systems  Constitutional: Negative for fever.  Respiratory: Negative for shortness of breath.   Cardiovascular: Negative for chest pain and leg swelling.  Gastrointestinal: Negative for abdominal pain, constipation and abdominal distention.  Genitourinary: Negative for dysuria, urgency, frequency, flank pain and difficulty urinating.  Musculoskeletal: Positive for back pain and arthralgias. Negative for joint swelling and gait problem.  Skin: Negative for rash and wound.  Neurological: Negative for weakness and numbness.    Allergies  Doxycycline; Nitroglycerin; and Vesicare  Home Medications   Current Outpatient Rx  Name  Route  Sig  Dispense  Refill  .  albuterol (PROVENTIL HFA;VENTOLIN HFA) 108 (90 BASE) MCG/ACT inhaler   Inhalation   Inhale 2 puffs into the lungs every 6 (six) hours as needed for wheezing or shortness of breath.          Marland Kitchen atenolol (TENORMIN) 50 MG tablet   Oral   Take 50 mg by mouth 2 (two) times daily.         . budesonide-formoterol (SYMBICORT) 80-4.5 MCG/ACT inhaler   Inhalation   Inhale 2 puffs into the lungs 2 (two) times daily.         . cyclobenzaprine (FLEXERIL) 10 MG tablet      10 mg. Take 1 tablet (10 mg total) by mouth every 8 (eight) hours as needed for Muscle spasms.         Marland Kitchen tiotropium (SPIRIVA) 18 MCG inhalation capsule   Inhalation   Place 18 mcg into inhaler and inhale daily.         Marland Kitchen warfarin (COUMADIN) 4 MG tablet   Oral   Take 4 mg by mouth  every evening.         . Oxycodone HCl 10 MG TABS   Oral   Take 1 tablet (10 mg total) by mouth every 6 (six) hours as needed (pain).   20 tablet   0     BP 133/87  Pulse 73  Temp(Src) 97 F (36.1 C) (Oral)  Resp 20  SpO2 98%  LMP 02/08/2007  Physical Exam  Nursing note and vitals reviewed. Constitutional: She appears well-developed and well-nourished.  HENT:  Head: Normocephalic.  Eyes: Conjunctivae are normal.  Neck: Normal range of motion. Neck supple.  Cardiovascular: Normal rate and intact distal pulses.   Pulses:      Radial pulses are 2+ on the right side, and 2+ on the left side.       Dorsalis pedis pulses are 2+ on the right side, and 2+ on the left side.  Pedal pulses normal.  Pulmonary/Chest: Effort normal.  Abdominal: Soft. Bowel sounds are normal. She exhibits no distension and no mass.  Musculoskeletal: Normal range of motion. She exhibits no edema.       Left shoulder: She exhibits tenderness. She exhibits normal range of motion, no swelling, no deformity, no laceration, no spasm, normal pulse and normal strength.       Lumbar back: She exhibits tenderness. She exhibits no bony tenderness, no swelling, no edema and no spasm.  Paralumbar ttp.  No midline tenderness.  Tender along left upper scapula and posterior left upper humerus.  FROM with discomfort but no crepitus,  No edema, induration or ecchymosis appreciated.  Neurological: She is alert. She has normal strength. She displays no atrophy and no tremor. No sensory deficit. Gait normal.  Reflex Scores:      Patellar reflexes are 2+ on the right side and 2+ on the left side. No strength deficit noted in hip and knee flexor and extensor muscle groups.  Ankle flexion and extension intact.  Skin: Skin is warm and dry.  Psychiatric: She has a normal mood and affect.    ED Course  Procedures (including critical care time)  Labs Reviewed - No data to display Dg Lumbar Spine Complete  03/14/2013   *RADIOLOGY REPORT*  Clinical Data: Fall. Low back pain.  LUMBAR SPINE - COMPLETE 4+ VIEW  Comparison: CT abdomen and pelvis 01/02/2009.  Findings: Five non-rib bearing lumbar type vertebral bodies are present. The vertebral body heights and alignment are maintained. Chronic loss of  disc height at L4-5 L5-S1 is again noted.  Minimal atherosclerotic calcifications are noted in the aorta without evidence for aneurysm.  IMPRESSION:  1.  No acute abnormality. 2.  Mild chronic degenerative disc disease at L4-5 and L5-S1.   Original Report Authenticated By: Marin Roberts, M.D.    Dg Shoulder Left  03/14/2013  *RADIOLOGY REPORT*  Clinical Data: History of injury from fall.  LEFT SHOULDER - 2+ VIEW  Comparison: None.  Findings: Transvenous pacemaker is in place with generator on the left. Alignment is normal.  Joint spaces are preserved.  No fracture or dislocation is evident.  No soft tissue lesions are seen.  No cervical rib is evident.  No calcific bursitis or calcific tendonitis is evident.  IMPRESSION: No fracture or dislocation evident.  Transvenous pacemaker in place.   Original Report Authenticated By: Onalee Hua Call      1. Fall at home, initial encounter   2. Lumbar strain, initial encounter   3. Shoulder strain, left, initial encounter       MDM  Patients labs and/or radiological studies were viewed and considered during the medical decision making and disposition process.  Pt was given lortab elixer (preferred liquid).  Prescribed oxycodone, avoiding tylenol due to hepatitis.  Encouraged f/u with her pcp for a recheck if not improved over the next week.  Heat applied to injury sites.  Prior to dc, pt also asked me to check a rash on her scalp which has been present for at least a year, stating she had a biopsy of it at [place] last year and was told it was from anxiety,  From picking it, which she claims she does rub frquently,  But it is itchy as well.  Desires referral to dermatology - gave  Dr. Marcelo Baldy number,  But advised pt referrals will probably need to come from her pcp given she is medicaid.  Scalp area involves and approximate 0.5 cm left parietal scab with trace of peripheral scaling.  Not wet or draining.  No induration,  No fluctuance,  Macular.  Probable local irritation from chronic rubbing the site, encouraged to avoid touching the area and to f/u with pcp prn.        Burgess Amor, PA-C 03/14/13 1836

## 2013-03-17 NOTE — ED Provider Notes (Signed)
Medical screening examination/treatment/procedure(s) were performed by non-physician practitioner and as supervising physician I was immediately available for consultation/collaboration.   Joya Gaskins, MD 03/17/13 [phone]

## 2013-03-28 ENCOUNTER — Encounter (HOSPITAL_COMMUNITY): Payer: Self-pay | Admitting: *Deleted

## 2013-03-28 ENCOUNTER — Emergency Department (HOSPITAL_COMMUNITY)
Admission: EM | Admit: 2013-03-28 | Discharge: 2013-03-28 | Disposition: A | Discharge status: Home / Self Care | Payer: Medicaid Other | Attending: Emergency Medicine | Admitting: Emergency Medicine

## 2013-03-28 DIAGNOSIS — Z8719 Personal history of other diseases of the digestive system: Secondary | ICD-10-CM | POA: Insufficient documentation

## 2013-03-28 DIAGNOSIS — K0889 Other specified disorders of teeth and supporting structures: Secondary | ICD-10-CM

## 2013-03-28 DIAGNOSIS — G8929 Other chronic pain: Secondary | ICD-10-CM | POA: Insufficient documentation

## 2013-03-28 DIAGNOSIS — R002 Palpitations: Secondary | ICD-10-CM | POA: Insufficient documentation

## 2013-03-28 DIAGNOSIS — Z79899 Other long term (current) drug therapy: Secondary | ICD-10-CM | POA: Insufficient documentation

## 2013-03-28 DIAGNOSIS — Z8619 Personal history of other infectious and parasitic diseases: Secondary | ICD-10-CM | POA: Insufficient documentation

## 2013-03-28 DIAGNOSIS — K089 Disorder of teeth and supporting structures, unspecified: Secondary | ICD-10-CM | POA: Insufficient documentation

## 2013-03-28 DIAGNOSIS — F411 Generalized anxiety disorder: Secondary | ICD-10-CM | POA: Insufficient documentation

## 2013-03-28 DIAGNOSIS — I252 Old myocardial infarction: Secondary | ICD-10-CM | POA: Insufficient documentation

## 2013-03-28 DIAGNOSIS — M79609 Pain in unspecified limb: Secondary | ICD-10-CM | POA: Insufficient documentation

## 2013-03-28 DIAGNOSIS — Z950 Presence of cardiac pacemaker: Secondary | ICD-10-CM | POA: Insufficient documentation

## 2013-03-28 DIAGNOSIS — Z8701 Personal history of pneumonia (recurrent): Secondary | ICD-10-CM | POA: Insufficient documentation

## 2013-03-28 DIAGNOSIS — Z8639 Personal history of other endocrine, nutritional and metabolic disease: Secondary | ICD-10-CM | POA: Insufficient documentation

## 2013-03-28 DIAGNOSIS — R51 Headache: Secondary | ICD-10-CM | POA: Insufficient documentation

## 2013-03-28 DIAGNOSIS — E119 Type 2 diabetes mellitus without complications: Secondary | ICD-10-CM | POA: Insufficient documentation

## 2013-03-28 DIAGNOSIS — F17200 Nicotine dependence, unspecified, uncomplicated: Secondary | ICD-10-CM | POA: Insufficient documentation

## 2013-03-28 DIAGNOSIS — I509 Heart failure, unspecified: Secondary | ICD-10-CM | POA: Insufficient documentation

## 2013-03-28 DIAGNOSIS — Z862 Personal history of diseases of the blood and blood-forming organs and certain disorders involving the immune mechanism: Secondary | ICD-10-CM | POA: Insufficient documentation

## 2013-03-28 DIAGNOSIS — Z7901 Long term (current) use of anticoagulants: Secondary | ICD-10-CM | POA: Insufficient documentation

## 2013-03-28 DIAGNOSIS — K029 Dental caries, unspecified: Secondary | ICD-10-CM | POA: Insufficient documentation

## 2013-03-28 DIAGNOSIS — I4891 Unspecified atrial fibrillation: Secondary | ICD-10-CM | POA: Insufficient documentation

## 2013-03-28 DIAGNOSIS — J441 Chronic obstructive pulmonary disease with (acute) exacerbation: Secondary | ICD-10-CM | POA: Insufficient documentation

## 2013-03-28 HISTORY — DX: Other chronic pain: G89.29

## 2013-03-28 MED ORDER — AMOXICILLIN 250 MG PO CAPS
500.0000 mg | ORAL_CAPSULE | Freq: Once | ORAL | Status: AC
  Administered 2013-03-28: 500 mg via ORAL
  Filled 2013-03-28: qty 2

## 2013-03-28 MED ORDER — AMOXICILLIN 500 MG PO CAPS: 500.0000 mg | ORAL_CAPSULE | Freq: Three times a day (TID) | ORAL | Status: DC

## 2013-03-28 MED ORDER — TRAMADOL HCL 50 MG PO TABS
100.0000 mg | ORAL_TABLET | Freq: Once | ORAL | Status: AC
  Administered 2013-03-28: 100 mg via ORAL
  Filled 2013-03-28: qty 2

## 2013-03-28 MED ORDER — PROMETHAZINE HCL 25 MG PO TABS: 25.0000 mg | ORAL_TABLET | Freq: Four times a day (QID) | ORAL | Status: DC | PRN

## 2013-03-28 MED ORDER — ONDANSETRON HCL 4 MG PO TABS
4.0000 mg | ORAL_TABLET | Freq: Once | ORAL | Status: AC
  Administered 2013-03-28: 4 mg via ORAL
  Filled 2013-03-28: qty 1

## 2013-03-28 MED ORDER — TRAMADOL HCL 50 MG PO TABS: 50.0000 mg | ORAL_TABLET | Freq: Four times a day (QID) | ORAL | Status: DC | PRN

## 2013-03-28 NOTE — ED Provider Notes (Signed)
History     CSN: [id number]  Arrival date & time 03/28/13  1550   First MD Initiated Contact with Patient 03/28/13 1640      Chief Complaint  Patient presents with  . Dental Pain  . Leg Pain    (Consider location/radiation/quality/duration/timing/severity/associated sxs/prior treatment) Patient is a 47 y.o. female presenting with tooth pain and leg pain. The history is provided by the patient.  Dental PainThe primary symptoms include mouth pain, headaches and shortness of breath. Primary symptoms do not include cough. The symptoms began more than 1 week ago. The symptoms are worsening. The symptoms occur constantly.  The headache is not associated with photophobia.  Additional symptoms include: gum swelling and jaw pain. Additional symptoms do not include: trouble swallowing and nosebleeds. Medical issues include: smoking.   Leg Pain Associated symptoms: no back pain and no neck pain     Past Medical History  Diagnosis Date  . Pacemaker     vent paced  . COPD (chronic obstructive pulmonary disease)   . CHF (congestive heart failure)   . Thyroid disease   . Hepatitis     Hep C  . MI (myocardial infarction)   . Pneumonia   . Dysrhythmia     atrial fibrilation  . Cirrhosis   . Shortness of breath   . Diabetes mellitus   . Chest pain   . GERD (gastroesophageal reflux disease)   . Headache   . Anxiety   . A-fib   . Chronic pain     Past Surgical History  Procedure Laterality Date  . Cholecystectomy    . Pacemaker insertion    . Insert / replace / remove pacemaker      02/2012    Family History  Problem Relation Age of Onset  . Coronary artery disease Father 36  . Coronary artery disease Mother 70  . Coronary artery disease Brother     History  Substance Use Topics  . Smoking status: Current Every Day Smoker -- 0.50 packs/day for 30 years    Types: Cigarettes  . Smokeless tobacco: Current User    Types: Snuff  . Alcohol Use: No    OB History   Grav  Para Term Preterm Abortions TAB SAB Ect Mult Living                  Review of Systems  Constitutional: Negative for activity change.       All ROS Neg except as noted in HPI  HENT: Positive for dental problem. Negative for nosebleeds, trouble swallowing and neck pain.   Eyes: Negative for photophobia and discharge.  Respiratory: Positive for shortness of breath and wheezing. Negative for cough.   Cardiovascular: Positive for palpitations. Negative for chest pain.  Gastrointestinal: Negative for abdominal pain and blood in stool.  Genitourinary: Negative for dysuria, frequency and hematuria.  Musculoskeletal: Negative for back pain and arthralgias.  Skin: Negative.   Neurological: Positive for headaches. Negative for dizziness, seizures and speech difficulty.  Psychiatric/Behavioral: Negative for hallucinations and confusion. The patient is nervous/anxious.     Allergies  Doxycycline; Nitroglycerin; and Vesicare  Home Medications   Current Outpatient Rx  Name  Route  Sig  Dispense  Refill  . atenolol (TENORMIN) 50 MG tablet   Oral   Take 50 mg by mouth 2 (two) times daily.         . budesonide-formoterol (SYMBICORT) 80-4.5 MCG/ACT inhaler   Inhalation   Inhale 2 puffs into the lungs 2 (  two) times daily.         . cyclobenzaprine (FLEXERIL) 10 MG tablet      10 mg. Take 1 tablet (10 mg total) by mouth every 8 (eight) hours as needed for Muscle spasms.         . Oxycodone HCl 10 MG TABS   Oral   Take 1 tablet (10 mg total) by mouth every 6 (six) hours as needed (pain).   20 tablet   0   . tiotropium (SPIRIVA) 18 MCG inhalation capsule   Inhalation   Place 18 mcg into inhaler and inhale daily.         Marland Kitchen warfarin (COUMADIN) 4 MG tablet   Oral   Take 4 mg by mouth every evening.         Marland Kitchen albuterol (PROVENTIL HFA;VENTOLIN HFA) 108 (90 BASE) MCG/ACT inhaler   Inhalation   Inhale 2 puffs into the lungs every 6 (six) hours as needed for wheezing or shortness  of breath.          Marland Kitchen amoxicillin (AMOXIL) 500 MG capsule   Oral   Take 1 capsule (500 mg total) by mouth 3 (three) times daily.   21 capsule   0   . promethazine (PHENERGAN) 25 MG tablet   Oral   Take 1 tablet (25 mg total) by mouth every 6 (six) hours as needed for nausea.   12 tablet   0   . traMADol (ULTRAM) 50 MG tablet   Oral   Take 1 tablet (50 mg total) by mouth every 6 (six) hours as needed for pain.   15 tablet   0     BP 137/96  Pulse 90  Temp(Src) 97.3 F (36.3 C) (Oral)  Resp 20  Ht 5\' 9"  (1.753 m)  Wt 235 lb (106.595 kg)  BMI 34.69 kg/m2  SpO2 96%  LMP 02/08/2007  Physical Exam  Nursing note and vitals reviewed. Constitutional: She is oriented to person, place, and time. She appears well-developed and well-nourished.  Non-toxic appearance.  HENT:  Head: Normocephalic. No trismus in the jaw.  Right Ear: Tympanic membrane and external ear normal.  Left Ear: Tympanic membrane and external ear normal.  Mouth/Throat: Dental caries present. No dental abscesses or edematous.  Eyes: EOM and lids are normal. Pupils are equal, round, and reactive to light.  Neck: Normal range of motion. Neck supple. Carotid bruit is not present.  Cardiovascular: Normal rate, regular rhythm, normal heart sounds, intact distal pulses and normal pulses.   Pulmonary/Chest: Breath sounds normal. No respiratory distress.  Abdominal: Soft. Bowel sounds are normal. There is no tenderness. There is no guarding.  Musculoskeletal: Normal range of motion.  Lymphadenopathy:       Head (right side): No submandibular adenopathy present.       Head (left side): No submandibular adenopathy present.    She has no cervical adenopathy.  Neurological: She is alert and oriented to person, place, and time. She has normal strength. No cranial nerve deficit or sensory deficit.  Skin: Skin is warm and dry.  Psychiatric: She has a normal mood and affect. Her speech is normal.    ED Course   Procedures (including critical care time)  Labs Reviewed - No data to display No results found.   1. Toothache       MDM  I have reviewed nursing notes, vital signs, and all appropriate lab and imaging results for this patient. Pt has multiple dental caries present. No abscess  noted. No signs of Ludwig's angina. No fever on review of vital signs. Plan  Rx for amoxil, ultram and promethazine given to the patient. Dental resources also given to the patient.       Kathie Dike, PA-C 04/05/13 [phone]

## 2013-03-28 NOTE — ED Notes (Signed)
Pt requesting Oxycodone 10mg  for pain. Pt has pain prescription of that and has run out. Pt informed to f/u with pcp who prescribes pain medication for refill. Ivery Quale PA aware.

## 2013-03-28 NOTE — ED Notes (Signed)
Dental pain x 1 week, states that she called dentist and unable to make an appt not til 6 months, no appt was made; also c/o bilateral leg pain which is chronic per pt.

## 2013-03-28 NOTE — ED Notes (Signed)
nad noted prior to dc. Dc instructions reviewed and 3 scripts given to pt. Ambulated out without difficulty.

## 2013-04-06 NOTE — ED Provider Notes (Signed)
Medical screening examination/treatment/procedure(s) were performed by non-physician practitioner and as supervising physician I was immediately available for consultation/collaboration.   Makaio Mach, MD 04/06/13 0013 

## 2013-04-15 ENCOUNTER — Encounter (HOSPITAL_COMMUNITY): Payer: Self-pay | Admitting: *Deleted

## 2013-04-15 ENCOUNTER — Emergency Department (HOSPITAL_COMMUNITY): Payer: Medicaid Other

## 2013-04-15 ENCOUNTER — Emergency Department (HOSPITAL_COMMUNITY)
Admission: EM | Admit: 2013-04-15 | Discharge: 2013-04-15 | Disposition: A | Discharge status: Home / Self Care | Payer: Medicaid Other | Attending: Emergency Medicine | Admitting: Emergency Medicine

## 2013-04-15 DIAGNOSIS — E119 Type 2 diabetes mellitus without complications: Secondary | ICD-10-CM | POA: Insufficient documentation

## 2013-04-15 DIAGNOSIS — Z950 Presence of cardiac pacemaker: Secondary | ICD-10-CM | POA: Insufficient documentation

## 2013-04-15 DIAGNOSIS — G8929 Other chronic pain: Secondary | ICD-10-CM | POA: Insufficient documentation

## 2013-04-15 DIAGNOSIS — R1084 Generalized abdominal pain: Secondary | ICD-10-CM | POA: Insufficient documentation

## 2013-04-15 DIAGNOSIS — Z862 Personal history of diseases of the blood and blood-forming organs and certain disorders involving the immune mechanism: Secondary | ICD-10-CM | POA: Insufficient documentation

## 2013-04-15 DIAGNOSIS — Z7901 Long term (current) use of anticoagulants: Secondary | ICD-10-CM | POA: Insufficient documentation

## 2013-04-15 DIAGNOSIS — I4891 Unspecified atrial fibrillation: Secondary | ICD-10-CM | POA: Insufficient documentation

## 2013-04-15 DIAGNOSIS — Z8619 Personal history of other infectious and parasitic diseases: Secondary | ICD-10-CM | POA: Insufficient documentation

## 2013-04-15 DIAGNOSIS — I509 Heart failure, unspecified: Secondary | ICD-10-CM | POA: Insufficient documentation

## 2013-04-15 DIAGNOSIS — J441 Chronic obstructive pulmonary disease with (acute) exacerbation: Secondary | ICD-10-CM | POA: Insufficient documentation

## 2013-04-15 DIAGNOSIS — Z79899 Other long term (current) drug therapy: Secondary | ICD-10-CM | POA: Insufficient documentation

## 2013-04-15 DIAGNOSIS — Z8639 Personal history of other endocrine, nutritional and metabolic disease: Secondary | ICD-10-CM | POA: Insufficient documentation

## 2013-04-15 DIAGNOSIS — Z8719 Personal history of other diseases of the digestive system: Secondary | ICD-10-CM | POA: Insufficient documentation

## 2013-04-15 DIAGNOSIS — Z8659 Personal history of other mental and behavioral disorders: Secondary | ICD-10-CM | POA: Insufficient documentation

## 2013-04-15 DIAGNOSIS — Z9089 Acquired absence of other organs: Secondary | ICD-10-CM | POA: Insufficient documentation

## 2013-04-15 DIAGNOSIS — I252 Old myocardial infarction: Secondary | ICD-10-CM | POA: Insufficient documentation

## 2013-04-15 DIAGNOSIS — R7989 Other specified abnormal findings of blood chemistry: Secondary | ICD-10-CM | POA: Insufficient documentation

## 2013-04-15 DIAGNOSIS — R197 Diarrhea, unspecified: Secondary | ICD-10-CM | POA: Insufficient documentation

## 2013-04-15 DIAGNOSIS — F17200 Nicotine dependence, unspecified, uncomplicated: Secondary | ICD-10-CM | POA: Insufficient documentation

## 2013-04-15 DIAGNOSIS — Z8701 Personal history of pneumonia (recurrent): Secondary | ICD-10-CM | POA: Insufficient documentation

## 2013-04-15 LAB — PROTIME-INR
INR: 1.22 (ref 0.00–1.49)
Prothrombin Time: 15.2 seconds (ref 11.6–15.2)

## 2013-04-15 LAB — CBC
MCH: 32.1 pg (ref 26.0–34.0)
MCV: 97.1 fL (ref 78.0–100.0)
Platelets: 223 10*3/uL (ref 150–400)
RBC: 4.21 MIL/uL (ref 3.87–5.11)
RDW: 16.4 % — ABNORMAL HIGH (ref 11.5–15.5)
WBC: 7.6 10*3/uL (ref 4.0–10.5)

## 2013-04-15 LAB — BASIC METABOLIC PANEL
CO2: 31 mEq/L (ref 19–32)
Calcium: 9 mg/dL (ref 8.4–10.5)
Chloride: 100 mEq/L (ref 96–112)
Creatinine, Ser: 0.7 mg/dL (ref 0.50–1.10)
GFR calc Af Amer: >90 mL/min (ref >90)
Sodium: 139 mEq/L (ref 135–145)

## 2013-04-15 MED ORDER — OXYCODONE-ACETAMINOPHEN 5-325 MG PO TABS: 1.0000 | ORAL_TABLET | Freq: Once | ORAL | Status: DC

## 2013-04-15 MED ORDER — PROMETHAZINE HCL 25 MG PO TABS: 25.0000 mg | ORAL_TABLET | Freq: Four times a day (QID) | ORAL | Status: DC | PRN

## 2013-04-15 MED ORDER — ONDANSETRON 4 MG PO TBDP
4.0000 mg | ORAL_TABLET | Freq: Once | ORAL | Status: AC
  Administered 2013-04-15: 4 mg via ORAL
  Filled 2013-04-15: qty 1

## 2013-04-15 MED ORDER — MORPHINE SULFATE 2 MG/ML IJ SOLN
2.0000 mg | Freq: Once | INTRAMUSCULAR | Status: AC
  Administered 2013-04-15: 2 mg via INTRAVENOUS
  Filled 2013-04-15: qty 1

## 2013-04-15 MED ORDER — SODIUM CHLORIDE 0.9 % IV BOLUS (SEPSIS)
1000.0000 mL | Freq: Once | INTRAVENOUS | Status: AC
  Administered 2013-04-15: 1000 mL via INTRAVENOUS

## 2013-04-15 MED ORDER — HYDROCODONE-ACETAMINOPHEN 5-325 MG PO TABS: 1.0000 | ORAL_TABLET | Freq: Four times a day (QID) | ORAL | Status: DC | PRN

## 2013-04-15 NOTE — ED Notes (Signed)
Pt states sob x 3 days. Dizziness. Nausea. States pain to pacemaker site at times. Pt is on 3L O2 at all times. NAD at this time.

## 2013-04-15 NOTE — ED Provider Notes (Signed)
History     CSN: [id number]  Arrival date & time 04/15/13  1603   First MD Initiated Contact with Patient 04/15/13 1720      Chief Complaint  Patient presents with  . Shortness of Breath    (Consider location/radiation/quality/duration/timing/severity/associated sxs/prior treatment) Patient is a 47 y.o. female presenting with diarrhea. The history is provided by the patient (the pt complains of diarrhea and some abd cramps). No language interpreter was used.  Diarrhea Quality:  Watery Severity:  Moderate Onset quality:  Sudden Timing:  Intermittent Progression:  Unchanged Relieved by:  Nothing Ineffective treatments:  None tried Associated symptoms: no abdominal pain and no headaches     Past Medical History  Diagnosis Date  . Pacemaker     vent paced  . COPD (chronic obstructive pulmonary disease)   . CHF (congestive heart failure)   . Thyroid disease   . Hepatitis     Hep C  . MI (myocardial infarction)   . Pneumonia   . Dysrhythmia     atrial fibrilation  . Cirrhosis   . Shortness of breath   . Diabetes mellitus   . Chest pain   . GERD (gastroesophageal reflux disease)   . Headache   . Anxiety   . A-fib   . Chronic pain     Past Surgical History  Procedure Laterality Date  . Cholecystectomy    . Pacemaker insertion    . Insert / replace / remove pacemaker      02/2012    Family History  Problem Relation Age of Onset  . Coronary artery disease Father 62  . Coronary artery disease Mother 37  . Coronary artery disease Brother     History  Substance Use Topics  . Smoking status: Current Every Day Smoker -- 0.50 packs/day for 30 years    Types: Cigarettes  . Smokeless tobacco: Current User    Types: Snuff  . Alcohol Use: No    OB History   Grav Para Term Preterm Abortions TAB SAB Ect Mult Living                  Review of Systems  Constitutional: Negative for appetite change and fatigue.  HENT: Negative for congestion, sinus pressure and  ear discharge.   Eyes: Negative for discharge.  Respiratory: Negative for cough.   Cardiovascular: Negative for chest pain.  Gastrointestinal: Positive for diarrhea. Negative for abdominal pain.  Genitourinary: Negative for frequency and hematuria.  Musculoskeletal: Negative for back pain.  Skin: Negative for rash.  Neurological: Negative for seizures and headaches.  Psychiatric/Behavioral: Negative for hallucinations.    Allergies  Doxycycline; Flexeril; Nitroglycerin; Tramadol; and Vesicare  Home Medications   Current Outpatient Rx  Name  Route  Sig  Dispense  Refill  . albuterol (PROVENTIL HFA;VENTOLIN HFA) 108 (90 BASE) MCG/ACT inhaler   Inhalation   Inhale 2 puffs into the lungs every 6 (six) hours as needed for wheezing or shortness of breath.          Marland Kitchen atenolol (TENORMIN) 50 MG tablet   Oral   Take 50 mg by mouth 2 (two) times daily.         . budesonide-formoterol (SYMBICORT) 80-4.5 MCG/ACT inhaler   Inhalation   Inhale 2 puffs into the lungs 2 (two) times daily.         Marland Kitchen tiotropium (SPIRIVA) 18 MCG inhalation capsule   Inhalation   Place 18 mcg into inhaler and inhale daily.         Marland Kitchen  warfarin (COUMADIN) 4 MG tablet   Oral   Take 4 mg by mouth every evening.         Marland Kitchen HYDROcodone-acetaminophen (NORCO/VICODIN) 5-325 MG per tablet   Oral   Take 1 tablet by mouth every 6 (six) hours as needed for pain.   20 tablet   0   . promethazine (PHENERGAN) 25 MG tablet   Oral   Take 1 tablet (25 mg total) by mouth every 6 (six) hours as needed for nausea.   15 tablet   0     BP 144/69  Pulse 100  Temp(Src) 97.6 F (36.4 C) (Oral)  Resp 20  Ht 6' (1.829 m)  Wt 223 lb (101.152 kg)  BMI 30.24 kg/m2  SpO2 100%  LMP 02/08/2007  Physical Exam  Constitutional: She is oriented to person, place, and time. She appears well-developed.  HENT:  Head: Normocephalic.  Eyes: Conjunctivae and EOM are normal. No scleral icterus.  Neck: Neck supple. No  thyromegaly present.  Cardiovascular: Normal rate and regular rhythm.  Exam reveals no gallop and no friction rub.   No murmur heard. Pulmonary/Chest: No stridor. She has no wheezes. She has no rales. She exhibits no tenderness.  Abdominal: She exhibits no distension. There is tenderness. There is no rebound.  Mild diffuse abd tender  Musculoskeletal: Normal range of motion. She exhibits no edema.  Lymphadenopathy:    She has no cervical adenopathy.  Neurological: She is oriented to person, place, and time. Coordination normal.  Skin: No rash noted. No erythema.  Psychiatric: She has a normal mood and affect. Her behavior is normal.    ED Course  Procedures (including critical care time)  Labs Reviewed  CBC - Abnormal; Notable for the following:    RDW 16.4 (*)    All other components within normal limits  BASIC METABOLIC PANEL - Abnormal; Notable for the following:    BUN 5 (*)    All other components within normal limits  TROPONIN I - Abnormal; Notable for the following:    Troponin I 0.48 (*)    All other components within normal limits  PROTIME-INR   Dg Chest 2 View  04/15/2013  *RADIOLOGY REPORT*  Clinical Data: Shortness of breath, history atrial fibrillation, CHF, pacemaker, hypertension, COPD, coronary disease post MI  CHEST - 2 VIEW  Comparison: 03/05/2013  Findings: Left subclavian sequential transvenous pacemaker leads project at right atrium and right ventricle. Enlargement of cardiac silhouette with pulmonary vascular congestion. Mild chronic accentuation of interstitial markings. Question asymmetric edema versus atelectasis or infiltrate at right base. Remaining lungs clear. Small right pleural effusion. No pneumothorax or acute osseous findings.  IMPRESSION: Enlargement of cardiac silhouette with pulmonary vascular congestion. Small right pleural effusion with asymmetric density right base which could represent asymmetric edema, atelectasis or infiltrate.   Original  Report Authenticated By: Ulyses Southward, M.D.      1. Diarrhea      Date: 04/15/2013  Rate: 80  Rhythm: atrial flutter  With paced ventricle  QRS Axis: left  Intervals: normal  ST/T Wave abnormalities: nonspecific T wave changes  Conduction Disutrbances:left bundle branch block  Narrative Interpretation:   Old EKG Reviewed: unchanged    MDM  Pt with chronic elevation of troponin and aflutter with pace rate.  Pt stable from cardiac problems.  diarhea moderate will treat abd cramps and have pt follow up with her md        Benny Lennert, MD 04/15/13 2054

## 2013-04-15 NOTE — ED Notes (Signed)
Pt c/o of chronic back pain, states that she has ran out of pain meds, also c/o pain to pacemaker site and diarrhea

## 2013-04-15 NOTE — ED Notes (Signed)
CRITICAL VALUE ALERT  Critical value received:  Troponin 0.49  Date of notification:  04/15/13  Time of notification:  1746  Critical value read back:yes  Nurse who received alert:  t Majesty Oehlert rn  MD notified (1st page):  [place]  Time of first page:  1746  MD notified (2nd page):  Time of second page:  Responding MD:    Time MD responded:

## 2013-04-20 ENCOUNTER — Encounter (HOSPITAL_COMMUNITY): Payer: Self-pay | Admitting: Emergency Medicine

## 2013-04-20 ENCOUNTER — Emergency Department (HOSPITAL_COMMUNITY)
Admission: EM | Admit: 2013-04-20 | Discharge: 2013-04-20 | Disposition: A | Discharge status: Home / Self Care | Payer: Medicaid Other | Attending: Emergency Medicine | Admitting: Emergency Medicine

## 2013-04-20 ENCOUNTER — Emergency Department (HOSPITAL_COMMUNITY): Payer: Medicaid Other

## 2013-04-20 DIAGNOSIS — J4489 Other specified chronic obstructive pulmonary disease: Secondary | ICD-10-CM | POA: Insufficient documentation

## 2013-04-20 DIAGNOSIS — Z8701 Personal history of pneumonia (recurrent): Secondary | ICD-10-CM | POA: Insufficient documentation

## 2013-04-20 DIAGNOSIS — R1011 Right upper quadrant pain: Secondary | ICD-10-CM | POA: Insufficient documentation

## 2013-04-20 DIAGNOSIS — Z8639 Personal history of other endocrine, nutritional and metabolic disease: Secondary | ICD-10-CM | POA: Insufficient documentation

## 2013-04-20 DIAGNOSIS — G8929 Other chronic pain: Secondary | ICD-10-CM | POA: Insufficient documentation

## 2013-04-20 DIAGNOSIS — R112 Nausea with vomiting, unspecified: Secondary | ICD-10-CM | POA: Insufficient documentation

## 2013-04-20 DIAGNOSIS — Z9089 Acquired absence of other organs: Secondary | ICD-10-CM | POA: Insufficient documentation

## 2013-04-20 DIAGNOSIS — K859 Acute pancreatitis without necrosis or infection, unspecified: Secondary | ICD-10-CM | POA: Insufficient documentation

## 2013-04-20 DIAGNOSIS — Z8669 Personal history of other diseases of the nervous system and sense organs: Secondary | ICD-10-CM | POA: Insufficient documentation

## 2013-04-20 DIAGNOSIS — R109 Unspecified abdominal pain: Secondary | ICD-10-CM

## 2013-04-20 DIAGNOSIS — J449 Chronic obstructive pulmonary disease, unspecified: Secondary | ICD-10-CM | POA: Insufficient documentation

## 2013-04-20 DIAGNOSIS — Z5189 Encounter for other specified aftercare: Secondary | ICD-10-CM | POA: Insufficient documentation

## 2013-04-20 DIAGNOSIS — Z8679 Personal history of other diseases of the circulatory system: Secondary | ICD-10-CM | POA: Insufficient documentation

## 2013-04-20 DIAGNOSIS — F17200 Nicotine dependence, unspecified, uncomplicated: Secondary | ICD-10-CM | POA: Insufficient documentation

## 2013-04-20 DIAGNOSIS — I4891 Unspecified atrial fibrillation: Secondary | ICD-10-CM | POA: Insufficient documentation

## 2013-04-20 DIAGNOSIS — E119 Type 2 diabetes mellitus without complications: Secondary | ICD-10-CM | POA: Insufficient documentation

## 2013-04-20 DIAGNOSIS — I509 Heart failure, unspecified: Secondary | ICD-10-CM | POA: Insufficient documentation

## 2013-04-20 DIAGNOSIS — Z862 Personal history of diseases of the blood and blood-forming organs and certain disorders involving the immune mechanism: Secondary | ICD-10-CM | POA: Insufficient documentation

## 2013-04-20 DIAGNOSIS — Z950 Presence of cardiac pacemaker: Secondary | ICD-10-CM | POA: Insufficient documentation

## 2013-04-20 DIAGNOSIS — Z8659 Personal history of other mental and behavioral disorders: Secondary | ICD-10-CM | POA: Insufficient documentation

## 2013-04-20 DIAGNOSIS — K529 Noninfective gastroenteritis and colitis, unspecified: Secondary | ICD-10-CM

## 2013-04-20 DIAGNOSIS — IMO0002 Reserved for concepts with insufficient information to code with codable children: Secondary | ICD-10-CM | POA: Insufficient documentation

## 2013-04-20 DIAGNOSIS — Z8719 Personal history of other diseases of the digestive system: Secondary | ICD-10-CM | POA: Insufficient documentation

## 2013-04-20 DIAGNOSIS — Z8709 Personal history of other diseases of the respiratory system: Secondary | ICD-10-CM | POA: Insufficient documentation

## 2013-04-20 DIAGNOSIS — Z7901 Long term (current) use of anticoagulants: Secondary | ICD-10-CM | POA: Insufficient documentation

## 2013-04-20 DIAGNOSIS — I252 Old myocardial infarction: Secondary | ICD-10-CM | POA: Insufficient documentation

## 2013-04-20 DIAGNOSIS — Z8619 Personal history of other infectious and parasitic diseases: Secondary | ICD-10-CM | POA: Insufficient documentation

## 2013-04-20 DIAGNOSIS — K5289 Other specified noninfective gastroenteritis and colitis: Secondary | ICD-10-CM | POA: Insufficient documentation

## 2013-04-20 LAB — CBC WITH DIFFERENTIAL/PLATELET
Basophils Relative: 0 % (ref 0–1)
Eosinophils Absolute: 0.1 10*3/uL (ref 0.0–0.7)
Hemoglobin: 13.3 g/dL (ref 12.0–15.0)
Lymphs Abs: 2.3 10*3/uL (ref 0.7–4.0)
MCHC: 33.3 g/dL (ref 30.0–36.0)
Monocytes Relative: 8 % (ref 3–12)
Neutro Abs: 5.2 10*3/uL (ref 1.7–7.7)
Neutrophils Relative %: 63 % (ref 43–77)
Platelets: 228 10*3/uL (ref 150–400)
RBC: 4.07 MIL/uL (ref 3.87–5.11)

## 2013-04-20 LAB — URINE MICROSCOPIC-ADD ON

## 2013-04-20 LAB — URINALYSIS, ROUTINE W REFLEX MICROSCOPIC
Bilirubin Urine: NEGATIVE
Glucose, UA: NEGATIVE mg/dL
Hgb urine dipstick: NEGATIVE
Ketones, ur: NEGATIVE mg/dL
Protein, ur: NEGATIVE mg/dL
pH: 6 (ref 5.0–8.0)

## 2013-04-20 LAB — COMPREHENSIVE METABOLIC PANEL
ALT: 11 U/L (ref 0–35)
Albumin: 3.7 g/dL (ref 3.5–5.2)
Alkaline Phosphatase: 71 U/L (ref 39–117)
BUN: 4 mg/dL — ABNORMAL LOW (ref 6–23)
Chloride: 99 mEq/L (ref 96–112)
Potassium: 3.4 mEq/L — ABNORMAL LOW (ref 3.5–5.1)
Sodium: 139 mEq/L (ref 135–145)
Total Bilirubin: 1 mg/dL (ref 0.3–1.2)
Total Protein: 7.4 g/dL (ref 6.0–8.3)

## 2013-04-20 LAB — PROTIME-INR: Prothrombin Time: 14.4 seconds (ref 11.6–15.2)

## 2013-04-20 MED ORDER — HYDROMORPHONE HCL PF 1 MG/ML IJ SOLN
1.0000 mg | Freq: Once | INTRAMUSCULAR | Status: AC
  Administered 2013-04-20: 1 mg via INTRAVENOUS
  Filled 2013-04-20: qty 1

## 2013-04-20 MED ORDER — HYDROCODONE-ACETAMINOPHEN 5-325 MG PO TABS: 1.0000 | ORAL_TABLET | Freq: Four times a day (QID) | ORAL | Status: DC | PRN

## 2013-04-20 MED ORDER — IOHEXOL 300 MG/ML  SOLN
100.0000 mL | Freq: Once | INTRAMUSCULAR | Status: AC | PRN
  Administered 2013-04-20: 100 mL via INTRAVENOUS

## 2013-04-20 MED ORDER — LOPERAMIDE HCL 2 MG PO TABS: 2.0000 mg | ORAL_TABLET | Freq: Four times a day (QID) | ORAL | Status: DC | PRN

## 2013-04-20 MED ORDER — SODIUM CHLORIDE 0.9 % IV SOLN
INTRAVENOUS | Status: DC
  Administered 2013-04-20: 21:00:00 via INTRAVENOUS

## 2013-04-20 MED ORDER — SODIUM CHLORIDE 0.9 % IV BOLUS (SEPSIS)
250.0000 mL | Freq: Once | INTRAVENOUS | Status: AC
  Administered 2013-04-20: 250 mL via INTRAVENOUS

## 2013-04-20 MED ORDER — ONDANSETRON HCL 4 MG/2ML IJ SOLN
4.0000 mg | Freq: Once | INTRAMUSCULAR | Status: AC
  Administered 2013-04-20: 4 mg via INTRAVENOUS
  Filled 2013-04-20: qty 2

## 2013-04-20 MED ORDER — PROMETHAZINE HCL 25 MG PO TABS: 25.0000 mg | ORAL_TABLET | Freq: Four times a day (QID) | ORAL | Status: DC | PRN

## 2013-04-20 MED ORDER — IOHEXOL 300 MG/ML  SOLN
50.0000 mL | Freq: Once | INTRAMUSCULAR | Status: AC | PRN
  Administered 2013-04-20: 50 mL via ORAL

## 2013-04-20 NOTE — ED Notes (Signed)
Pt reporting improvement in pain and nausea.  Tolerating CT prep with no difficulty.

## 2013-04-20 NOTE — ED Provider Notes (Addendum)
Assumed care at signout to f/u on Ct imaging No acute findings She was made aware of right pleural effusion Abdomen soft and no guarding/rebound Doubt ischemic bowel or other occult abd process Stable for d/c  Joya Gaskins, MD 04/20/13 2228  Joya Gaskins, MD 04/20/13 2229

## 2013-04-20 NOTE — ED Notes (Signed)
Pt reporting generalized abdominal pain.  As well as nausea and vomiting.  Pt has been seen and treated for same here last week, also reports being seen and treated at [facility] 2 days ago.  Reports history of Hep C.  Pt also reports continuing SOB.  No distress noted at this time.  Pt carries on conversation with no difficulty.

## 2013-04-20 NOTE — ED Notes (Signed)
02 2l [place] applied 

## 2013-04-20 NOTE — ED Notes (Addendum)
Pt here last week for dizziness, sob and rlq pain.pt here for same today. Pt on 2L [place] 02 all the time but pt wears intermittant. Pt states " my pacemaker hurts and makes my L shoulder hurt" pt had this complaint last week also. Pt is alert/oriented. Nad. No s/s of resp distress or pain at present. Pt stable with ambulation. V/d everyday x 2 weeks. Mm wet.

## 2013-04-20 NOTE — ED Notes (Signed)
Pt continues to c/o nausea and abdominal pain.  Will administer medications after pt returns from CT.

## 2013-04-20 NOTE — ED Provider Notes (Addendum)
History     CSN: [id number]  Arrival date & time 04/20/13  1754   First MD Initiated Contact with Patient 04/20/13 1821      Chief Complaint  Patient presents with  . Dizziness  . Abdominal Pain    (Consider location/radiation/quality/duration/timing/severity/associated sxs/prior treatment) Patient is a 48 y.o. female presenting with abdominal pain. The history is provided by the patient.  Abdominal Pain Associated symptoms: diarrhea, nausea and vomiting   Associated symptoms: no chest pain, no dysuria, no fever and no shortness of breath   Abdominal Pain Associated symptoms include abdominal pain. Pertinent negatives include no chest pain, no headaches and no shortness of breath.   patient states three-week history of abdominal pain mostly on the right upper quadrant area. Associated with diarrhea with and vomiting vomited once today 4-5 times yesterday. Patient states 3 weeks but in reality it sounds like most of the symptoms have been for the past week. Patient was seen in the emergency department May 10 for shortness of breath discharge diagnosis was diarrhea however patient not receive CT scan of the abdomen.  Past Medical History  Diagnosis Date  . Pacemaker     vent paced  . COPD (chronic obstructive pulmonary disease)   . CHF (congestive heart failure)   . Thyroid disease   . Hepatitis     Hep C  . MI (myocardial infarction)   . Pneumonia   . Dysrhythmia     atrial fibrilation  . Cirrhosis   . Shortness of breath   . Diabetes mellitus   . Chest pain   . GERD (gastroesophageal reflux disease)   . Headache   . Anxiety   . A-fib   . Chronic pain     Past Surgical History  Procedure Laterality Date  . Cholecystectomy    . Pacemaker insertion    . Insert / replace / remove pacemaker      02/2012    Family History  Problem Relation Age of Onset  . Coronary artery disease Father 41  . Coronary artery disease Mother 50  . Coronary artery disease Brother      History  Substance Use Topics  . Smoking status: Current Every Day Smoker -- 0.50 packs/day for 30 years    Types: Cigarettes  . Smokeless tobacco: Current User    Types: Snuff  . Alcohol Use: No    OB History   Grav Para Term Preterm Abortions TAB SAB Ect Mult Living                  Review of Systems  Constitutional: Negative for fever.  HENT: Negative for neck pain.   Eyes: Negative for visual disturbance.  Respiratory: Negative for shortness of breath.   Cardiovascular: Negative for chest pain.  Gastrointestinal: Positive for nausea, vomiting, abdominal pain and diarrhea.  Genitourinary: Negative for dysuria.  Musculoskeletal: Negative for back pain.  Skin: Negative for rash.  Neurological: Negative for headaches.  Hematological: Bruises/bleeds easily.  Psychiatric/Behavioral: Negative for confusion.    Allergies  Doxycycline; Flexeril; Nitroglycerin; Tramadol; and Vesicare  Home Medications   Current Outpatient Rx  Name  Route  Sig  Dispense  Refill  . albuterol (PROVENTIL HFA;VENTOLIN HFA) 108 (90 BASE) MCG/ACT inhaler   Inhalation   Inhale 2 puffs into the lungs every 6 (six) hours as needed for wheezing or shortness of breath.          Marland Kitchen atenolol (TENORMIN) 50 MG tablet   Oral   Take  50 mg by mouth 2 (two) times daily.         . budesonide-formoterol (SYMBICORT) 80-4.5 MCG/ACT inhaler   Inhalation   Inhale 2 puffs into the lungs 2 (two) times daily.         Marland Kitchen HYDROcodone-acetaminophen (NORCO/VICODIN) 5-325 MG per tablet   Oral   Take 1 tablet by mouth every 6 (six) hours as needed for pain.   20 tablet   0   . promethazine (PHENERGAN) 25 MG tablet   Oral   Take 1 tablet (25 mg total) by mouth every 6 (six) hours as needed for nausea.   15 tablet   0   . tiotropium (SPIRIVA) 18 MCG inhalation capsule   Inhalation   Place 18 mcg into inhaler and inhale daily.         Marland Kitchen warfarin (COUMADIN) 4 MG tablet   Oral   Take 4 mg by mouth  every evening.           BP 147/90  Pulse 72  Temp(Src) 97.5 F (36.4 C) (Oral)  Resp 20  SpO2 100%  LMP 02/08/2007  Physical Exam  Nursing note and vitals reviewed. Constitutional: She is oriented to person, place, and time. She appears well-developed and well-nourished. No distress.  HENT:  Head: Normocephalic and atraumatic.  Mouth/Throat: Oropharynx is clear and moist.  Eyes: Conjunctivae and EOM are normal. Pupils are equal, round, and reactive to light.  Neck: Normal range of motion. Neck supple.  Cardiovascular: Normal rate, regular rhythm and normal heart sounds.   No murmur heard. Abdominal: Soft. Bowel sounds are normal. There is no tenderness.  Musculoskeletal: Normal range of motion.  Neurological: She is alert and oriented to person, place, and time. No cranial nerve deficit. She exhibits normal muscle tone. Coordination normal.  Skin: No rash noted.    ED Course  Procedures (including critical care time)  Labs Reviewed  CBC WITH DIFFERENTIAL - Abnormal; Notable for the following:    RDW 16.5 (*)    All other components within normal limits  COMPREHENSIVE METABOLIC PANEL - Abnormal; Notable for the following:    Potassium 3.4 (*)    BUN 4 (*)    All other components within normal limits  URINALYSIS, ROUTINE W REFLEX MICROSCOPIC - Abnormal; Notable for the following:    Urobilinogen, UA 4.0 (*)    Leukocytes, UA SMALL (*)    All other components within normal limits  LIPASE, BLOOD - Abnormal; Notable for the following:    Lipase 90 (*)    All other components within normal limits  URINE MICROSCOPIC-ADD ON - Abnormal; Notable for the following:    Squamous Epithelial / LPF MANY (*)    Bacteria, UA MANY (*)    All other components within normal limits  URINE CULTURE  PROTIME-INR   No results found. Results for orders placed during the hospital encounter of 04/20/13  CBC WITH DIFFERENTIAL      Result Value Range   WBC 8.3  4.0 - 10.5 K/uL   RBC  4.07  3.87 - 5.11 MIL/uL   Hemoglobin 13.3  12.0 - 15.0 g/dL   HCT 16.1  09.6 - 04.5 %   MCV 98.3  78.0 - 100.0 fL   MCH 32.7  26.0 - 34.0 pg   MCHC 33.3  30.0 - 36.0 g/dL   RDW 40.9 (*) 81.1 - 91.4 %   Platelets 228  150 - 400 K/uL   Neutrophils Relative % 63  43 -  77 %   Neutro Abs 5.2  1.7 - 7.7 K/uL   Lymphocytes Relative 28  12 - 46 %   Lymphs Abs 2.3  0.7 - 4.0 K/uL   Monocytes Relative 8  3 - 12 %   Monocytes Absolute 0.7  0.1 - 1.0 K/uL   Eosinophils Relative 2  0 - 5 %   Eosinophils Absolute 0.1  0.0 - 0.7 K/uL   Basophils Relative 0  0 - 1 %   Basophils Absolute 0.0  0.0 - 0.1 K/uL  COMPREHENSIVE METABOLIC PANEL      Result Value Range   Sodium 139  135 - 145 mEq/L   Potassium 3.4 (*) 3.5 - 5.1 mEq/L   Chloride 99  96 - 112 mEq/L   CO2 29  19 - 32 mEq/L   Glucose, Bld 97  70 - 99 mg/dL   BUN 4 (*) 6 - 23 mg/dL   Creatinine, Ser 1.61  0.50 - 1.10 mg/dL   Calcium 8.9  8.4 - 09.6 mg/dL   Total Protein 7.4  6.0 - 8.3 g/dL   Albumin 3.7  3.5 - 5.2 g/dL   AST 23  0 - 37 U/L   ALT 11  0 - 35 U/L   Alkaline Phosphatase 71  39 - 117 U/L   Total Bilirubin 1.0  0.3 - 1.2 mg/dL   GFR calc non Af Amer >90  >90 mL/min   GFR calc Af Amer >90  >90 mL/min  URINALYSIS, ROUTINE W REFLEX MICROSCOPIC      Result Value Range   Color, Urine YELLOW  YELLOW   APPearance CLEAR  CLEAR   Specific Gravity, Urine 1.010  1.005 - 1.030   pH 6.0  5.0 - 8.0   Glucose, UA NEGATIVE  NEGATIVE mg/dL   Hgb urine dipstick NEGATIVE  NEGATIVE   Bilirubin Urine NEGATIVE  NEGATIVE   Ketones, ur NEGATIVE  NEGATIVE mg/dL   Protein, ur NEGATIVE  NEGATIVE mg/dL   Urobilinogen, UA 4.0 (*) 0.0 - 1.0 mg/dL   Nitrite NEGATIVE  NEGATIVE   Leukocytes, UA SMALL (*) NEGATIVE  LIPASE, BLOOD      Result Value Range   Lipase 90 (*) 11 - 59 U/L  URINE MICROSCOPIC-ADD ON      Result Value Range   Squamous Epithelial / LPF MANY (*) RARE   WBC, UA 11-20  <3 WBC/hpf   Bacteria, UA MANY (*) RARE   Labs without  significant findings except for the lipase being at 90. That may explain the patient's abdominal pain. CT of her abdomen is still pending. Patient was seen last week more for shortness of breath and chest workup. Not having a bowel workup. Patient does not have a leukocytosis. Her function test are normal no elevation in the bilirubin.  1. Abdominal pain   2. Pancreatitis       MDM  Labs are suggestive of mild pancreatitis. CT scan is pending. Patient's other symptoms with the vomiting and diarrhea could be related to a gastroenteritis but it has been going on for several days. Patient was seen May 10 with workup predominantly for shortness of breath and the chest discomfort however discharge diagnosis was diarrhea. Patient did not have a scan of the abdomen at that time. Patient has history of hepatitis C however liver function test are normal other than lipase being elevated. If CT scan does not show any significant abnormalities will treat patient as if this is mild pancreatitis along with  a gastroenteritis. Patient improved in the emergency department with pain medication and the antinausea medicine. Patient does not have a primary care provider.  Patient's urinalysis is not consistent with urinary tract infection and and is most consistent with contamination.       Shelda Jakes, MD 04/20/13 2111  Shelda Jakes, MD 04/27/13 [phone]

## 2013-04-20 NOTE — ED Notes (Addendum)
Pt requesting crackers and ginger ale.  Reporting improvement in pain and nausea

## 2013-04-21 LAB — URINE CULTURE: Colony Count: NO GROWTH

## 2013-04-22 ENCOUNTER — Emergency Department (HOSPITAL_COMMUNITY)
Admission: EM | Admit: 2013-04-22 | Discharge: 2013-04-23 | Disposition: A | Discharge status: Home / Self Care | Payer: Medicaid Other | Attending: Emergency Medicine | Admitting: Emergency Medicine

## 2013-04-22 ENCOUNTER — Encounter (HOSPITAL_COMMUNITY): Payer: Self-pay

## 2013-04-22 DIAGNOSIS — R1013 Epigastric pain: Secondary | ICD-10-CM | POA: Insufficient documentation

## 2013-04-22 DIAGNOSIS — Z79899 Other long term (current) drug therapy: Secondary | ICD-10-CM | POA: Insufficient documentation

## 2013-04-22 DIAGNOSIS — R112 Nausea with vomiting, unspecified: Secondary | ICD-10-CM | POA: Insufficient documentation

## 2013-04-22 DIAGNOSIS — Z9089 Acquired absence of other organs: Secondary | ICD-10-CM | POA: Insufficient documentation

## 2013-04-22 DIAGNOSIS — J449 Chronic obstructive pulmonary disease, unspecified: Secondary | ICD-10-CM | POA: Insufficient documentation

## 2013-04-22 DIAGNOSIS — Z8639 Personal history of other endocrine, nutritional and metabolic disease: Secondary | ICD-10-CM | POA: Insufficient documentation

## 2013-04-22 DIAGNOSIS — J4489 Other specified chronic obstructive pulmonary disease: Secondary | ICD-10-CM | POA: Insufficient documentation

## 2013-04-22 DIAGNOSIS — F17200 Nicotine dependence, unspecified, uncomplicated: Secondary | ICD-10-CM | POA: Insufficient documentation

## 2013-04-22 DIAGNOSIS — Z862 Personal history of diseases of the blood and blood-forming organs and certain disorders involving the immune mechanism: Secondary | ICD-10-CM | POA: Insufficient documentation

## 2013-04-22 DIAGNOSIS — I252 Old myocardial infarction: Secondary | ICD-10-CM | POA: Insufficient documentation

## 2013-04-22 DIAGNOSIS — Z8719 Personal history of other diseases of the digestive system: Secondary | ICD-10-CM | POA: Insufficient documentation

## 2013-04-22 DIAGNOSIS — Z8701 Personal history of pneumonia (recurrent): Secondary | ICD-10-CM | POA: Insufficient documentation

## 2013-04-22 DIAGNOSIS — R109 Unspecified abdominal pain: Secondary | ICD-10-CM

## 2013-04-22 DIAGNOSIS — Z950 Presence of cardiac pacemaker: Secondary | ICD-10-CM | POA: Insufficient documentation

## 2013-04-22 DIAGNOSIS — R079 Chest pain, unspecified: Secondary | ICD-10-CM | POA: Insufficient documentation

## 2013-04-22 DIAGNOSIS — F411 Generalized anxiety disorder: Secondary | ICD-10-CM | POA: Insufficient documentation

## 2013-04-22 DIAGNOSIS — M542 Cervicalgia: Secondary | ICD-10-CM | POA: Insufficient documentation

## 2013-04-22 DIAGNOSIS — Z8679 Personal history of other diseases of the circulatory system: Secondary | ICD-10-CM | POA: Insufficient documentation

## 2013-04-22 DIAGNOSIS — I509 Heart failure, unspecified: Secondary | ICD-10-CM | POA: Insufficient documentation

## 2013-04-22 DIAGNOSIS — Z7901 Long term (current) use of anticoagulants: Secondary | ICD-10-CM | POA: Insufficient documentation

## 2013-04-22 LAB — COMPREHENSIVE METABOLIC PANEL
Alkaline Phosphatase: 70 U/L (ref 39–117)
BUN: 6 mg/dL (ref 6–23)
Chloride: 100 mEq/L (ref 96–112)
Creatinine, Ser: 0.71 mg/dL (ref 0.50–1.10)
GFR calc Af Amer: >90 mL/min (ref >90)
Glucose, Bld: 85 mg/dL (ref 70–99)
Potassium: 3.1 mEq/L — ABNORMAL LOW (ref 3.5–5.1)
Total Bilirubin: 1 mg/dL (ref 0.3–1.2)
Total Protein: 7.3 g/dL (ref 6.0–8.3)

## 2013-04-22 LAB — CBC WITH DIFFERENTIAL/PLATELET
Basophils Relative: 0 % (ref 0–1)
Eosinophils Absolute: 0.2 10*3/uL (ref 0.0–0.7)
HCT: 38.3 % (ref 36.0–46.0)
Hemoglobin: 12.5 g/dL (ref 12.0–15.0)
Lymphs Abs: 2.3 10*3/uL (ref 0.7–4.0)
MCH: 32.5 pg (ref 26.0–34.0)
MCHC: 32.6 g/dL (ref 30.0–36.0)
Monocytes Absolute: 0.7 10*3/uL (ref 0.1–1.0)
Monocytes Relative: 8 % (ref 3–12)
Neutrophils Relative %: 62 % (ref 43–77)
RBC: 3.85 MIL/uL — ABNORMAL LOW (ref 3.87–5.11)

## 2013-04-22 LAB — LIPASE, BLOOD: Lipase: 70 U/L — ABNORMAL HIGH (ref 11–59)

## 2013-04-22 MED ORDER — SODIUM CHLORIDE 0.9 % IV BOLUS (SEPSIS)
1000.0000 mL | Freq: Once | INTRAVENOUS | Status: AC
  Administered 2013-04-22: 1000 mL via INTRAVENOUS

## 2013-04-22 MED ORDER — OXYCODONE HCL 10 MG PO TABS: 10.0000 mg | ORAL_TABLET | Freq: Four times a day (QID) | ORAL | Status: DC | PRN

## 2013-04-22 MED ORDER — ONDANSETRON HCL 4 MG/2ML IJ SOLN
4.0000 mg | Freq: Once | INTRAMUSCULAR | Status: AC
  Administered 2013-04-22: 4 mg via INTRAVENOUS
  Filled 2013-04-22: qty 2

## 2013-04-22 MED ORDER — MORPHINE SULFATE 4 MG/ML IJ SOLN
4.0000 mg | Freq: Once | INTRAMUSCULAR | Status: AC
  Administered 2013-04-22: 4 mg via INTRAVENOUS
  Filled 2013-04-22: qty 1

## 2013-04-22 NOTE — ED Notes (Signed)
Patient states that she was here a few days ago with the vomiting, states that she has been vomiting non-stop for 2 weeks, unable to keep anything down. Hurting is epigastric region and across chest from the vomiting. States that she hurts when she moves.

## 2013-04-22 NOTE — ED Provider Notes (Signed)
History  This chart was scribed for Geoffery Lyons, MD by Bennett Scrape, ED Scribe. This patient was seen in room [facility] and the patient's care was started at 10:02 PM.  CSN: [id number]  Arrival date & time 04/22/13  7829   First MD Initiated Contact with Patient 04/22/13 2202      Chief Complaint  Patient presents with  . Emesis  . Abdominal Pain    The history is provided by the patient. No language interpreter was used.    HPI Comments: Kim [name] is a 47 y.o. female with a h/o pancreatitis, chronic pain and Hep C who presents to the Emergency Department complaining of 3 weeks of gradual onset, gradually worsening, constant epigastric abdominal pain with associated emesis and CP from emesis. Pt states the it feels as if her pacemaker is causing shooting pain up through her neck when she moves or vomits. She has been unable to eat or drink since the onset.  She was seen 2 days ago for the same and diagnosed with pancreatitis. She states that she has tried to f/u with her PCP but has been unable to get an appointment. She states that she has asked for GI referrals from her PCP before but was denied. She denies any other symptoms currently. She has a h/o DM, MI and A. Fib.  Past Medical History  Diagnosis Date  . Pacemaker     vent paced  . COPD (chronic obstructive pulmonary disease)   . CHF (congestive heart failure)   . Thyroid disease   . Hepatitis     Hep C  . MI (myocardial infarction)   . Pneumonia   . Dysrhythmia     atrial fibrilation  . Cirrhosis   . Shortness of breath   . Diabetes mellitus   . Chest pain   . GERD (gastroesophageal reflux disease)   . Headache   . Anxiety   . A-fib   . Chronic pain     Past Surgical History  Procedure Laterality Date  . Cholecystectomy    . Pacemaker insertion    . Insert / replace / remove pacemaker      02/2012    Family History  Problem Relation Age of Onset  . Coronary artery disease Father 75  . Coronary  artery disease Mother 23  . Coronary artery disease Brother     History  Substance Use Topics  . Smoking status: Current Every Day Smoker -- 0.50 packs/day for 30 years    Types: Cigarettes  . Smokeless tobacco: Current User    Types: Snuff  . Alcohol Use: No    No OB history provided.  Review of Systems  HENT: Positive for neck pain. Negative for congestion.   Cardiovascular: Positive for chest pain. Negative for leg swelling.  Gastrointestinal: Positive for nausea, vomiting and abdominal pain. Negative for diarrhea.  All other systems reviewed and are negative.    Allergies  Doxycycline; Flexeril; Nitroglycerin; Tramadol; and Vesicare  Home Medications   Current Outpatient Rx  Name  Route  Sig  Dispense  Refill  . albuterol (PROVENTIL HFA;VENTOLIN HFA) 108 (90 BASE) MCG/ACT inhaler   Inhalation   Inhale 2 puffs into the lungs every 6 (six) hours as needed for wheezing or shortness of breath.          Marland Kitchen atenolol (TENORMIN) 50 MG tablet   Oral   Take 100 mg by mouth daily.          . budesonide-formoterol (  SYMBICORT) 80-4.5 MCG/ACT inhaler   Inhalation   Inhale 2 puffs into the lungs 2 (two) times daily.         Marland Kitchen HYDROcodone-acetaminophen (NORCO/VICODIN) 5-325 MG per tablet   Oral   Take 1-2 tablets by mouth every 6 (six) hours as needed for pain.   15 tablet   0   . loperamide (IMODIUM A-D) 2 MG tablet   Oral   Take 1 tablet (2 mg total) by mouth 4 (four) times daily as needed for diarrhea or loose stools.   30 tablet   0   . promethazine (PHENERGAN) 25 MG tablet   Oral   Take 1 tablet (25 mg total) by mouth every 6 (six) hours as needed for nausea.   12 tablet   0   . tiotropium (SPIRIVA) 18 MCG inhalation capsule   Inhalation   Place 18 mcg into inhaler and inhale daily.         Marland Kitchen warfarin (COUMADIN) 4 MG tablet   Oral   Take 4 mg by mouth every evening.           Triage Vitals: BP 149/80  Pulse 75  Temp(Src) 97.2 F (36.2 C)  (Oral)  Resp 16  SpO2 100%  LMP 02/08/2007  Physical Exam  Nursing note and vitals reviewed. Constitutional: She is oriented to person, place, and time. She appears well-developed and well-nourished. No distress.  HENT:  Head: Normocephalic and atraumatic.  Eyes: EOM are normal.  Neck: Neck supple. No tracheal deviation present.  Cardiovascular: Normal rate.   Pulmonary/Chest: Effort normal. No respiratory distress.  Abdominal: There is tenderness.  Tenderness to palpation in the perium egion. No rebound or guarding. Bowel sounds are present.   Musculoskeletal: Normal range of motion.  Neurological: She is alert and oriented to person, place, and time.  Skin: Skin is warm and dry.  Psychiatric: She has a normal mood and affect. Her behavior is normal.    ED Course  Procedures (including critical care time)  DIAGNOSTIC STUDIES: Oxygen Saturation is 100% on room air, normal by my interpretation.    COORDINATION OF CARE: 10:09 PM Discussed with pt that she needs to follow up with her PCP about seeing a specialist.  Labs Reviewed  CBC WITH DIFFERENTIAL  COMPREHENSIVE METABOLIC PANEL  LIPASE, BLOOD  URINALYSIS, ROUTINE W REFLEX MICROSCOPIC   No results found.      No diagnosis found.    MDM  Patient with chronic abd pain, frequents the ED for same.  No cause found again tonight with the exception of a mildly elevated lipase.  She reports her primary doesn't listen to her and won't make any appointment with specialists.  Will give the contact information for the GI on call.      I personally performed the services described in this documentation, which was scribed in my presence. The recorded information has been reviewed and is accurate.         Geoffery Lyons, MD 04/22/13 [phone]

## 2013-06-25 ENCOUNTER — Encounter (HOSPITAL_COMMUNITY): Payer: Self-pay | Admitting: Emergency Medicine

## 2013-06-25 ENCOUNTER — Emergency Department (HOSPITAL_COMMUNITY)
Admission: EM | Admit: 2013-06-25 | Discharge: 2013-06-25 | Disposition: A | Discharge status: Home / Self Care | Payer: Medicaid Other | Attending: Emergency Medicine | Admitting: Emergency Medicine

## 2013-06-25 DIAGNOSIS — F17200 Nicotine dependence, unspecified, uncomplicated: Secondary | ICD-10-CM | POA: Insufficient documentation

## 2013-06-25 DIAGNOSIS — Y9389 Activity, other specified: Secondary | ICD-10-CM | POA: Insufficient documentation

## 2013-06-25 DIAGNOSIS — Z8639 Personal history of other endocrine, nutritional and metabolic disease: Secondary | ICD-10-CM | POA: Insufficient documentation

## 2013-06-25 DIAGNOSIS — J449 Chronic obstructive pulmonary disease, unspecified: Secondary | ICD-10-CM | POA: Insufficient documentation

## 2013-06-25 DIAGNOSIS — I509 Heart failure, unspecified: Secondary | ICD-10-CM | POA: Insufficient documentation

## 2013-06-25 DIAGNOSIS — Z8619 Personal history of other infectious and parasitic diseases: Secondary | ICD-10-CM | POA: Insufficient documentation

## 2013-06-25 DIAGNOSIS — Z950 Presence of cardiac pacemaker: Secondary | ICD-10-CM | POA: Insufficient documentation

## 2013-06-25 DIAGNOSIS — Z8659 Personal history of other mental and behavioral disorders: Secondary | ICD-10-CM | POA: Insufficient documentation

## 2013-06-25 DIAGNOSIS — Z79899 Other long term (current) drug therapy: Secondary | ICD-10-CM | POA: Insufficient documentation

## 2013-06-25 DIAGNOSIS — I4891 Unspecified atrial fibrillation: Secondary | ICD-10-CM | POA: Insufficient documentation

## 2013-06-25 DIAGNOSIS — Z862 Personal history of diseases of the blood and blood-forming organs and certain disorders involving the immune mechanism: Secondary | ICD-10-CM | POA: Insufficient documentation

## 2013-06-25 DIAGNOSIS — S335XXA Sprain of ligaments of lumbar spine, initial encounter: Secondary | ICD-10-CM | POA: Insufficient documentation

## 2013-06-25 DIAGNOSIS — I252 Old myocardial infarction: Secondary | ICD-10-CM | POA: Insufficient documentation

## 2013-06-25 DIAGNOSIS — Z8701 Personal history of pneumonia (recurrent): Secondary | ICD-10-CM | POA: Insufficient documentation

## 2013-06-25 DIAGNOSIS — J4489 Other specified chronic obstructive pulmonary disease: Secondary | ICD-10-CM | POA: Insufficient documentation

## 2013-06-25 DIAGNOSIS — Y929 Unspecified place or not applicable: Secondary | ICD-10-CM | POA: Insufficient documentation

## 2013-06-25 DIAGNOSIS — X500XXA Overexertion from strenuous movement or load, initial encounter: Secondary | ICD-10-CM | POA: Insufficient documentation

## 2013-06-25 DIAGNOSIS — E119 Type 2 diabetes mellitus without complications: Secondary | ICD-10-CM | POA: Insufficient documentation

## 2013-06-25 DIAGNOSIS — S39012A Strain of muscle, fascia and tendon of lower back, initial encounter: Secondary | ICD-10-CM

## 2013-06-25 DIAGNOSIS — Z8719 Personal history of other diseases of the digestive system: Secondary | ICD-10-CM | POA: Insufficient documentation

## 2013-06-25 MED ORDER — PREDNISONE 10 MG PO TABS: 20.0000 mg | ORAL_TABLET | Freq: Two times a day (BID) | ORAL | Status: DC

## 2013-06-25 MED ORDER — OXYCODONE-ACETAMINOPHEN 5-325 MG PO TABS
1.0000 | ORAL_TABLET | Freq: Once | ORAL | Status: AC
  Administered 2013-06-25: 1 via ORAL
  Filled 2013-06-25: qty 1

## 2013-06-25 MED ORDER — METHOCARBAMOL 500 MG PO TABS: 500.0000 mg | ORAL_TABLET | Freq: Two times a day (BID) | ORAL | Status: DC

## 2013-06-25 MED ORDER — OXYCODONE-ACETAMINOPHEN 5-325 MG PO TABS: 1.0000 | ORAL_TABLET | ORAL | Status: DC | PRN

## 2013-06-25 NOTE — ED Provider Notes (Signed)
History    CSN: [id number] Arrival date & time 06/25/13  1649  First MD Initiated Contact with Patient 06/25/13 1730     Chief Complaint  Patient presents with  . Back Pain   (Consider location/radiation/quality/duration/timing/severity/associated sxs/prior Treatment) Patient is a 47 y.o. female presenting with back pain. The history is provided by the patient.  Back Pain Location:  Lumbar spine Quality:  Shooting and stabbing Pain severity:  Severe Pain is:  Same all the time Onset quality:  Sudden Duration:  1 day Timing:  Constant Progression:  Worsening Chronicity:  New Context: physical stress   Relieved by:  Nothing Worsened by:  Movement Ineffective treatments:  NSAIDs Associated symptoms: no abdominal pain, no bladder incontinence, no bowel incontinence, no chest pain, no dysuria, no fever and no headaches    Kim [name] is a 47 y.o. female who presents to the ED with low back pain. She states that she bent forward to pick up her grandson who weight 31 pounds and when she picked him up she felt a sharp pain and pop in the lower back. She has continued to have pain since that time. The pain is worse today. For her chronic back pain her doctor in [place], [place] gives her Percocet 10/325. She has been out of her medication x 2 weeks and her next appointment is next week.   Past Medical History  Diagnosis Date  . Pacemaker     vent paced  . COPD (chronic obstructive pulmonary disease)   . CHF (congestive heart failure)   . Thyroid disease   . Hepatitis     Hep C  . MI (myocardial infarction)   . Pneumonia   . Dysrhythmia     atrial fibrilation  . Cirrhosis   . Shortness of breath   . Diabetes mellitus   . Chest pain   . GERD (gastroesophageal reflux disease)   . Headache(784.0)   . Anxiety   . A-fib   . Chronic pain    Past Surgical History  Procedure Laterality Date  . Cholecystectomy    . Pacemaker insertion    . Insert / replace / remove pacemaker     02/2012   Family History  Problem Relation Age of Onset  . Coronary artery disease Father 75  . Coronary artery disease Mother 86  . Coronary artery disease Brother    History  Substance Use Topics  . Smoking status: Current Every Day Smoker -- 0.50 packs/day for 30 years    Types: Cigarettes  . Smokeless tobacco: Current User    Types: Snuff  . Alcohol Use: No   OB History   Grav Para Term Preterm Abortions TAB SAB Ect Mult Living                 Review of Systems  Constitutional: Negative for fever.  Respiratory:       Hx of CHF and COPD  Cardiovascular: Negative for chest pain.  Gastrointestinal: Negative for nausea, vomiting, abdominal pain and bowel incontinence.  Genitourinary: Negative for bladder incontinence, dysuria, urgency and frequency.  Musculoskeletal: Positive for back pain.  Skin: Negative for wound.  Neurological: Negative for headaches.  Psychiatric/Behavioral: Sleep disturbance: due to pain last night. The patient is not nervous/anxious.     Allergies  Doxycycline; Flexeril; Nitroglycerin; Tramadol; and Vesicare  Home Medications   Current Outpatient Rx  Name  Route  Sig  Dispense  Refill  . albuterol (PROVENTIL HFA;VENTOLIN HFA) 108 (90 BASE) MCG/ACT  inhaler   Inhalation   Inhale 2 puffs into the lungs every 6 (six) hours as needed for wheezing or shortness of breath.          Marland Kitchen atenolol (TENORMIN) 50 MG tablet   Oral   Take 100 mg by mouth daily.          . budesonide-formoterol (SYMBICORT) 80-4.5 MCG/ACT inhaler   Inhalation   Inhale 2 puffs into the lungs 2 (two) times daily.         Marland Kitchen HYDROcodone-acetaminophen (NORCO/VICODIN) 5-325 MG per tablet   Oral   Take 1-2 tablets by mouth every 6 (six) hours as needed for pain.   15 tablet   0   . loperamide (IMODIUM A-D) 2 MG tablet   Oral   Take 1 tablet (2 mg total) by mouth 4 (four) times daily as needed for diarrhea or loose stools.   30 tablet   0   . Oxycodone HCl 10 MG  TABS   Oral   Take 1 tablet (10 mg total) by mouth every 6 (six) hours as needed.   12 tablet   0   . promethazine (PHENERGAN) 25 MG tablet   Oral   Take 1 tablet (25 mg total) by mouth every 6 (six) hours as needed for nausea.   12 tablet   0   . tiotropium (SPIRIVA) 18 MCG inhalation capsule   Inhalation   Place 18 mcg into inhaler and inhale daily.         Marland Kitchen warfarin (COUMADIN) 4 MG tablet   Oral   Take 4 mg by mouth every evening.          BP 138/76  Pulse 72  Temp(Src) 98.2 F (36.8 C) (Oral)  Resp 13  SpO2 100%  LMP 02/08/2007 Physical Exam  Nursing note and vitals reviewed. Constitutional: She is oriented to person, place, and time. She appears well-developed and well-nourished. No distress.  HENT:  Head: Normocephalic and atraumatic.  Eyes: EOM are normal.  Neck: Normal range of motion. Neck supple.  Cardiovascular: Normal rate and regular rhythm.   Pulmonary/Chest: Effort normal and breath sounds normal.  Abdominal: Soft. Bowel sounds are normal. There is no tenderness.  Musculoskeletal:       Lumbar back: She exhibits decreased range of motion, tenderness and spasm.       Back:  Pain lower lumbar area with range of motion. Ambulatory without difficulty or foot drag. Pedal pulses equal, adequate circulation, good touch sensation. Tender with palpation lumbar area.   Neurological: She is alert and oriented to person, place, and time. No cranial nerve deficit.  Skin: Skin is warm and dry.  Psychiatric: She has a normal mood and affect. Her behavior is normal.    ED Course  Procedures (including critical care time) Labs Reviewed  GLUCOSE, CAPILLARY - Abnormal; Notable for the following:    Glucose-Capillary 153 (*)    All other components within normal limits    MDM  47 y.o. female with hx of chronic low back pain. Increased pain after lifting grand child last night.  Will treat pain and muscle spasm and she is to call her doctor tomorrow for follow  up.  Discussed with the patient plan of care and all questioned fully answered.   Medication List    STOP taking these medications       HYDROcodone-acetaminophen 5-325 MG per tablet  Commonly known as:  NORCO/VICODIN     Oxycodone HCl 10 MG Tabs  TAKE these medications       methocarbamol 500 MG tablet  Commonly known as:  ROBAXIN  Take 1 tablet (500 mg total) by mouth 2 (two) times daily.     oxyCODONE-acetaminophen 5-325 MG per tablet  Commonly known as:  PERCOCET/ROXICET  Take 1 tablet by mouth every 4 (four) hours as needed for pain.     predniSONE 10 MG tablet  Commonly known as:  DELTASONE  Take 2 tablets (20 mg total) by mouth 2 (two) times daily.      ASK your doctor about these medications       albuterol 108 (90 BASE) MCG/ACT inhaler  Commonly known as:  PROVENTIL HFA;VENTOLIN HFA  Inhale 2 puffs into the lungs every 6 (six) hours as needed for wheezing or shortness of breath.     atenolol 50 MG tablet  Commonly known as:  TENORMIN  Take 100 mg by mouth daily.     budesonide-formoterol 80-4.5 MCG/ACT inhaler  Commonly known as:  SYMBICORT  Inhale 2 puffs into the lungs 2 (two) times daily.     loperamide 2 MG tablet  Commonly known as:  IMODIUM A-D  Take 1 tablet (2 mg total) by mouth 4 (four) times daily as needed for diarrhea or loose stools.     tiotropium 18 MCG inhalation capsule  Commonly known as:  SPIRIVA  Place 18 mcg into inhaler and inhale daily.     warfarin 4 MG tablet  Commonly known as:  COUMADIN  Take 4 mg by mouth every evening.         Janne [place], [place] 06/25/13 [phone]

## 2013-06-25 NOTE — ED Notes (Signed)
Lower back pain since last night after picking grandbaby up. Took ibuprofen with no relief per pt.

## 2013-06-25 NOTE — ED Provider Notes (Signed)
Medical screening examination/treatment/procedure(s) were performed by non-physician practitioner and as supervising physician I was immediately available for consultation/collaboration.  Flint Melter, MD 06/25/13 [phone]

## 2013-07-02 ENCOUNTER — Encounter (HOSPITAL_COMMUNITY): Payer: Self-pay | Admitting: Emergency Medicine

## 2013-07-02 ENCOUNTER — Emergency Department (HOSPITAL_COMMUNITY)
Admission: EM | Admit: 2013-07-02 | Discharge: 2013-07-02 | Disposition: A | Discharge status: Home / Self Care | Payer: Medicaid Other | Attending: Emergency Medicine | Admitting: Emergency Medicine

## 2013-07-02 DIAGNOSIS — Z8639 Personal history of other endocrine, nutritional and metabolic disease: Secondary | ICD-10-CM | POA: Insufficient documentation

## 2013-07-02 DIAGNOSIS — S39012D Strain of muscle, fascia and tendon of lower back, subsequent encounter: Secondary | ICD-10-CM

## 2013-07-02 DIAGNOSIS — I509 Heart failure, unspecified: Secondary | ICD-10-CM | POA: Insufficient documentation

## 2013-07-02 DIAGNOSIS — E119 Type 2 diabetes mellitus without complications: Secondary | ICD-10-CM | POA: Insufficient documentation

## 2013-07-02 DIAGNOSIS — J441 Chronic obstructive pulmonary disease with (acute) exacerbation: Secondary | ICD-10-CM | POA: Insufficient documentation

## 2013-07-02 DIAGNOSIS — F17200 Nicotine dependence, unspecified, uncomplicated: Secondary | ICD-10-CM | POA: Insufficient documentation

## 2013-07-02 DIAGNOSIS — G8929 Other chronic pain: Secondary | ICD-10-CM | POA: Insufficient documentation

## 2013-07-02 DIAGNOSIS — Z8619 Personal history of other infectious and parasitic diseases: Secondary | ICD-10-CM | POA: Insufficient documentation

## 2013-07-02 DIAGNOSIS — R05 Cough: Secondary | ICD-10-CM | POA: Insufficient documentation

## 2013-07-02 DIAGNOSIS — R6883 Chills (without fever): Secondary | ICD-10-CM | POA: Insufficient documentation

## 2013-07-02 DIAGNOSIS — R609 Edema, unspecified: Secondary | ICD-10-CM | POA: Insufficient documentation

## 2013-07-02 DIAGNOSIS — I252 Old myocardial infarction: Secondary | ICD-10-CM | POA: Insufficient documentation

## 2013-07-02 DIAGNOSIS — Z79899 Other long term (current) drug therapy: Secondary | ICD-10-CM | POA: Insufficient documentation

## 2013-07-02 DIAGNOSIS — Z7901 Long term (current) use of anticoagulants: Secondary | ICD-10-CM | POA: Insufficient documentation

## 2013-07-02 DIAGNOSIS — R319 Hematuria, unspecified: Secondary | ICD-10-CM | POA: Insufficient documentation

## 2013-07-02 DIAGNOSIS — Z8659 Personal history of other mental and behavioral disorders: Secondary | ICD-10-CM | POA: Insufficient documentation

## 2013-07-02 DIAGNOSIS — I4891 Unspecified atrial fibrillation: Secondary | ICD-10-CM | POA: Insufficient documentation

## 2013-07-02 DIAGNOSIS — Z8679 Personal history of other diseases of the circulatory system: Secondary | ICD-10-CM | POA: Insufficient documentation

## 2013-07-02 DIAGNOSIS — Z87448 Personal history of other diseases of urinary system: Secondary | ICD-10-CM | POA: Insufficient documentation

## 2013-07-02 DIAGNOSIS — M545 Low back pain, unspecified: Secondary | ICD-10-CM | POA: Insufficient documentation

## 2013-07-02 DIAGNOSIS — Z8719 Personal history of other diseases of the digestive system: Secondary | ICD-10-CM | POA: Insufficient documentation

## 2013-07-02 DIAGNOSIS — R109 Unspecified abdominal pain: Secondary | ICD-10-CM | POA: Insufficient documentation

## 2013-07-02 DIAGNOSIS — Z862 Personal history of diseases of the blood and blood-forming organs and certain disorders involving the immune mechanism: Secondary | ICD-10-CM | POA: Insufficient documentation

## 2013-07-02 DIAGNOSIS — Z950 Presence of cardiac pacemaker: Secondary | ICD-10-CM | POA: Insufficient documentation

## 2013-07-02 DIAGNOSIS — Z8701 Personal history of pneumonia (recurrent): Secondary | ICD-10-CM | POA: Insufficient documentation

## 2013-07-02 DIAGNOSIS — R059 Cough, unspecified: Secondary | ICD-10-CM | POA: Insufficient documentation

## 2013-07-02 LAB — URINALYSIS, ROUTINE W REFLEX MICROSCOPIC
Glucose, UA: NEGATIVE mg/dL
Hgb urine dipstick: NEGATIVE
Ketones, ur: NEGATIVE mg/dL
Protein, ur: NEGATIVE mg/dL
Urobilinogen, UA: 2 mg/dL — ABNORMAL HIGH (ref 0.0–1.0)

## 2013-07-02 MED ORDER — HYDROCODONE-ACETAMINOPHEN 5-325 MG PO TABS
1.0000 | ORAL_TABLET | Freq: Once | ORAL | Status: AC
  Administered 2013-07-02: 1 via ORAL
  Filled 2013-07-02: qty 1

## 2013-07-02 MED ORDER — ORPHENADRINE CITRATE ER 100 MG PO TB12: 100.0000 mg | ORAL_TABLET | Freq: Once | ORAL | Status: DC

## 2013-07-02 MED ORDER — METHOCARBAMOL 500 MG PO TABS
1000.0000 mg | ORAL_TABLET | Freq: Once | ORAL | Status: AC
  Administered 2013-07-02: 1000 mg via ORAL
  Filled 2013-07-02: qty 2

## 2013-07-02 MED ORDER — ORPHENADRINE CITRATE ER 100 MG PO TB12: 100.0000 mg | ORAL_TABLET | Freq: Two times a day (BID) | ORAL | Status: DC

## 2013-07-02 NOTE — ED Notes (Signed)
States that she is having lower back pain that started about 2 weeks ago.  States that she is having blood in her urine that started this morning.

## 2013-07-02 NOTE — ED Notes (Signed)
nad noted prior to dc. Dc instructions reviewed and explained. 1 script given. Ambulated out without difficulty.  

## 2013-07-02 NOTE — ED Provider Notes (Signed)
CSN: [id number]     Arrival date & time 07/02/13  1412 History     This chart was scribed for Kim Skeens, MD by Jiles Prows, ED Scribe. The patient was seen in room APA10/APA10 and the patient's care was started at 2:24 PM.   Chief Complaint  Patient presents with  . Hematuria  . Back Pain   Patient is a 47 y.o. female presenting with back pain. The history is provided by the patient, a relative and medical records. No language interpreter was used.  Back Pain Associated symptoms: no fever    HPI Comments: Kim [name] is a 46 y.o. female with a h/o COPD, CHF, thyroid disease, Hepatitis C, GERD, DM, SOB, and A-fib who presents to the Emergency Department complaining of back pain onset 2 weeks ago.  Pt reports that yesterday, her urine was clear.  Today, she reports that her urine has blood in it and was painful to pass.  Pt reports h/o urine infection, but denies previous kidney stone.   Pt claims chills, cough, SOB (this morning), and bilateral leg swelling (left worse).  Pt denies headache, diaphoresis, fever, nausea, vomiting, diarrhea, weakness, and any other pain. Pt reports h/o blood clot in lungs.  Daughter reports pt lifted grandson about 2 weeks ago.  Past Medical History  Diagnosis Date  . Pacemaker     vent paced  . COPD (chronic obstructive pulmonary disease)   . CHF (congestive heart failure)   . Thyroid disease   . Hepatitis     Hep C  . MI (myocardial infarction)   . Pneumonia   . Dysrhythmia     atrial fibrilation  . Cirrhosis   . Shortness of breath   . Diabetes mellitus   . Chest pain   . GERD (gastroesophageal reflux disease)   . Headache(784.0)   . Anxiety   . A-fib   . Chronic pain    Past Surgical History  Procedure Laterality Date  . Cholecystectomy    . Pacemaker insertion    . Insert / replace / remove pacemaker      02/2012   Family History  Problem Relation Age of Onset  . Coronary artery disease Father 62  . Coronary artery disease  Mother 35  . Coronary artery disease Brother    History  Substance Use Topics  . Smoking status: Current Every Day Smoker -- 0.50 packs/day for 30 years    Types: Cigarettes  . Smokeless tobacco: Current User    Types: Snuff  . Alcohol Use: No   OB History   Grav Para Term Preterm Abortions TAB SAB Ect Mult Living                 Review of Systems  Constitutional: Positive for chills. Negative for fever.  Genitourinary: Positive for hematuria and flank pain.  Musculoskeletal: Positive for back pain.  All other systems reviewed and are negative.    Allergies  Doxycycline; Flexeril; Nitroglycerin; Tramadol; and Vesicare  Home Medications   Current Outpatient Rx  Name  Route  Sig  Dispense  Refill  . atenolol (TENORMIN) 50 MG tablet   Oral   Take 100 mg by mouth daily.          . budesonide-formoterol (SYMBICORT) 80-4.5 MCG/ACT inhaler   Inhalation   Inhale 2 puffs into the lungs 2 (two) times daily.         . methocarbamol (ROBAXIN) 500 MG tablet   Oral   Take  1 tablet (500 mg total) by mouth 2 (two) times daily.   20 tablet   0   . oxyCODONE-acetaminophen (PERCOCET/ROXICET) 5-325 MG per tablet   Oral   Take 1 tablet by mouth every 4 (four) hours as needed for pain.   15 tablet   0   . predniSONE (DELTASONE) 10 MG tablet   Oral   Take 2 tablets (20 mg total) by mouth 2 (two) times daily.   20 tablet   0   . tiotropium (SPIRIVA) 18 MCG inhalation capsule   Inhalation   Place 18 mcg into inhaler and inhale daily.         Marland Kitchen warfarin (COUMADIN) 4 MG tablet   Oral   Take 4 mg by mouth every evening.         Marland Kitchen albuterol (PROVENTIL HFA;VENTOLIN HFA) 108 (90 BASE) MCG/ACT inhaler   Inhalation   Inhale 2 puffs into the lungs every 6 (six) hours as needed for wheezing or shortness of breath.           BP 131/84  Pulse 78  Temp(Src) 98.3 F (36.8 C) (Oral)  Resp 14  SpO2 99%  LMP 02/08/2007 Physical Exam  Nursing note and vitals  reviewed. Constitutional: She is oriented to person, place, and time. She appears well-developed and well-nourished. No distress.  HENT:  Head: Normocephalic and atraumatic.  Eyes: EOM are normal.  Neck: Neck supple. No tracheal deviation present.  Cardiovascular: Normal rate, regular rhythm and normal heart sounds.  Exam reveals no gallop and no friction rub.   No murmur heard. Pulmonary/Chest: Effort normal. No respiratory distress. She has no wheezes (Mild expiratory wheeze bilaterally.). She has no rales.  Abdominal: Soft. She exhibits no distension and no mass. There is no tenderness (No focal tenderness.). There is no rebound and no guarding.  Musculoskeletal: Normal range of motion. She exhibits edema and tenderness.  Mild left flank tenderness.  Mild swelling to both legs.   No focal pain or swelling to legs.  Left paraspinal tenderness.  Paraspinal lumbar tenderness.  Neurological: She is alert and oriented to person, place, and time. She has normal strength. No sensory deficit.  Skin: Skin is warm and dry.  Psychiatric: She has a normal mood and affect. Her behavior is normal.    ED Course   Procedures (including critical care time) EMERGENCY DEPARTMENT ULTRASOUND  Study: Limited Retroperitoneal/ Abdominal Ultrasound of the Abdominal Aorta.  INDICATIONS:Back pain Indication: Multiple views of the abdominal aorta are obtained from the diaphragmatic hiatus to the aortic bifurcation in transverse planes with a multi- Frequency probe.  PERFORMED BY: Myself  IMAGES ARCHIVED?: Yes  FINDINGS: Maximum aortic dimensions are 2.2cm  LIMITATIONS:  Body habitus  INTERPRETATION:  No abdominal aortic aneurysm   Emergency Focused Ultrasound Exam: Limited abdomen of kidneys and bladder Indication: flank pain Focused abdominal ultrasound with kidneys imaged in transverse and longitudinal planes with bladder visualized in transverse plane. Interpretation:No hydronephrosis visualized  in either kidney.  No stones visualized  Images stored on the machine.  DIAGNOSTIC STUDIES: Filed Vitals:   07/02/13 1415  BP: 131/84  Pulse: 78  Temp: 98.3 F (36.8 C)  TempSrc: Oral  Resp: 14  SpO2: 99%   COORDINATION OF CARE: 2:29 PM - Discussed ED treatment with pt at bedside including [place], urinalysis, muscle relaxant and pain management and pt agrees.  Advised pt that she may receive pain medicationin ED, but will not write prescription.  2:29 PM - Ultrasound of bilateral  kidneys at bedside.  No significant hydronephrosis of kidneys.  Aorta visualized, normal diameter.   Labs Reviewed  URINALYSIS, ROUTINE W REFLEX MICROSCOPIC - Abnormal; Notable for the following:    Bilirubin Urine SMALL (*)    Urobilinogen, UA 2.0 (*)    All other components within normal limits   No results found. No diagnosis found.  MDM  Recurrent chronic pain, msk.   Bedside US no AAA, no hydronephrosis.  Muscle relaxant given. Pt requesting narcotics for home, multiple visits to ed and narcotic scripts, discussed chronic pain and no indication for repeat narcotic script with risks of narcotics, pt understands.   DC No red flags.    Kim Skeens, MD 07/02/13 [phone]

## 2013-07-08 ENCOUNTER — Encounter (HOSPITAL_COMMUNITY): Payer: Self-pay | Admitting: *Deleted

## 2013-07-08 ENCOUNTER — Emergency Department (HOSPITAL_COMMUNITY)
Admission: EM | Admit: 2013-07-08 | Discharge: 2013-07-08 | Disposition: A | Discharge status: Home / Self Care | Payer: Medicaid Other | Attending: Emergency Medicine | Admitting: Emergency Medicine

## 2013-07-08 DIAGNOSIS — I4891 Unspecified atrial fibrillation: Secondary | ICD-10-CM | POA: Insufficient documentation

## 2013-07-08 DIAGNOSIS — I252 Old myocardial infarction: Secondary | ICD-10-CM | POA: Insufficient documentation

## 2013-07-08 DIAGNOSIS — Z8639 Personal history of other endocrine, nutritional and metabolic disease: Secondary | ICD-10-CM | POA: Insufficient documentation

## 2013-07-08 DIAGNOSIS — J4489 Other specified chronic obstructive pulmonary disease: Secondary | ICD-10-CM | POA: Insufficient documentation

## 2013-07-08 DIAGNOSIS — G8929 Other chronic pain: Secondary | ICD-10-CM | POA: Insufficient documentation

## 2013-07-08 DIAGNOSIS — I509 Heart failure, unspecified: Secondary | ICD-10-CM | POA: Insufficient documentation

## 2013-07-08 DIAGNOSIS — F17200 Nicotine dependence, unspecified, uncomplicated: Secondary | ICD-10-CM | POA: Insufficient documentation

## 2013-07-08 DIAGNOSIS — Z8679 Personal history of other diseases of the circulatory system: Secondary | ICD-10-CM | POA: Insufficient documentation

## 2013-07-08 DIAGNOSIS — F411 Generalized anxiety disorder: Secondary | ICD-10-CM | POA: Insufficient documentation

## 2013-07-08 DIAGNOSIS — Z950 Presence of cardiac pacemaker: Secondary | ICD-10-CM | POA: Insufficient documentation

## 2013-07-08 DIAGNOSIS — Z862 Personal history of diseases of the blood and blood-forming organs and certain disorders involving the immune mechanism: Secondary | ICD-10-CM | POA: Insufficient documentation

## 2013-07-08 DIAGNOSIS — Z79899 Other long term (current) drug therapy: Secondary | ICD-10-CM | POA: Insufficient documentation

## 2013-07-08 DIAGNOSIS — E119 Type 2 diabetes mellitus without complications: Secondary | ICD-10-CM | POA: Insufficient documentation

## 2013-07-08 DIAGNOSIS — Z8619 Personal history of other infectious and parasitic diseases: Secondary | ICD-10-CM | POA: Insufficient documentation

## 2013-07-08 DIAGNOSIS — K089 Disorder of teeth and supporting structures, unspecified: Secondary | ICD-10-CM | POA: Insufficient documentation

## 2013-07-08 DIAGNOSIS — K029 Dental caries, unspecified: Secondary | ICD-10-CM

## 2013-07-08 DIAGNOSIS — Z8701 Personal history of pneumonia (recurrent): Secondary | ICD-10-CM | POA: Insufficient documentation

## 2013-07-08 DIAGNOSIS — J449 Chronic obstructive pulmonary disease, unspecified: Secondary | ICD-10-CM | POA: Insufficient documentation

## 2013-07-08 DIAGNOSIS — Z7901 Long term (current) use of anticoagulants: Secondary | ICD-10-CM | POA: Insufficient documentation

## 2013-07-08 DIAGNOSIS — Z8719 Personal history of other diseases of the digestive system: Secondary | ICD-10-CM | POA: Insufficient documentation

## 2013-07-08 MED ORDER — HYDROCODONE-ACETAMINOPHEN 5-325 MG PO TABS: 1.0000 | ORAL_TABLET | ORAL | Status: DC | PRN

## 2013-07-08 MED ORDER — AMOXICILLIN 500 MG PO CAPS: 500.0000 mg | ORAL_CAPSULE | Freq: Three times a day (TID) | ORAL | Status: DC

## 2013-07-08 NOTE — ED Notes (Signed)
Pt reporting pain due to abscess upper right side.  States she is to see dentist but tooth can't be pulled till she is off coumadin for a period of time.

## 2013-07-08 NOTE — ED Provider Notes (Signed)
CSN: [id number]     Arrival date & time 07/08/13  1909 History     First MD Initiated Contact with Patient 07/08/13 1921     Chief Complaint  Patient presents with  . Dental Pain   (Consider location/radiation/quality/duration/timing/severity/associated sxs/prior Treatment) Patient is a 47 y.o. female presenting with tooth pain. The history is provided by the patient.  Dental Pain Location:  Upper and lower Upper teeth location:  2/RU 2nd molar Lower teeth location:  32/RL 3rd molar Quality:  Throbbing Severity:  Moderate Onset quality:  Gradual Chronicity:  New Context: dental caries   Relieved by:  Nothing Worsened by:  Hot food/drink and cold food/drink Associated symptoms: no fever and no headaches    Kim [name] is a 47 y.o. female who presents to the ED with dental pain. The pain started 2 days ago after she was eating and broke a tooth in the lower back right side. She also has pain in the upper right.   Past Medical History  Diagnosis Date  . Pacemaker     vent paced  . COPD (chronic obstructive pulmonary disease)   . CHF (congestive heart failure)   . Thyroid disease   . Hepatitis     Hep C  . MI (myocardial infarction)   . Pneumonia   . Dysrhythmia     atrial fibrilation  . Cirrhosis   . Shortness of breath   . Diabetes mellitus   . Chest pain   . GERD (gastroesophageal reflux disease)   . Headache(784.0)   . Anxiety   . A-fib   . Chronic pain    Past Surgical History  Procedure Laterality Date  . Cholecystectomy    . Pacemaker insertion    . Insert / replace / remove pacemaker      02/2012   Family History  Problem Relation Age of Onset  . Coronary artery disease Father 45  . Coronary artery disease Mother 80  . Coronary artery disease Brother    History  Substance Use Topics  . Smoking status: Current Every Day Smoker -- 0.50 packs/day for 30 years    Types: Cigarettes  . Smokeless tobacco: Current User    Types: Snuff  . Alcohol Use:  No   OB History   Grav Para Term Preterm Abortions TAB SAB Ect Mult Living                 Review of Systems  Constitutional: Negative for fever and chills.  HENT: Positive for dental problem. Negative for sore throat.   Gastrointestinal: Negative for nausea and vomiting.  Musculoskeletal: Negative for back pain.  Skin: Negative for rash.  Neurological: Negative for dizziness and headaches.  Psychiatric/Behavioral: The patient is not nervous/anxious.     Allergies  Doxycycline; Flexeril; Nitroglycerin; Tramadol; and Vesicare  Home Medications   Current Outpatient Rx  Name  Route  Sig  Dispense  Refill  . albuterol (PROVENTIL HFA;VENTOLIN HFA) 108 (90 BASE) MCG/ACT inhaler   Inhalation   Inhale 2 puffs into the lungs every 6 (six) hours as needed for wheezing or shortness of breath.          Marland Kitchen atenolol (TENORMIN) 50 MG tablet   Oral   Take 100 mg by mouth daily.          . budesonide-formoterol (SYMBICORT) 80-4.5 MCG/ACT inhaler   Inhalation   Inhale 2 puffs into the lungs 2 (two) times daily.         Marland Kitchen  methocarbamol (ROBAXIN) 500 MG tablet   Oral   Take 1 tablet (500 mg total) by mouth 2 (two) times daily.   20 tablet   0   . orphenadrine (NORFLEX) 100 MG tablet   Oral   Take 1 tablet (100 mg total) by mouth 2 (two) times daily.   10 tablet   0   . oxyCODONE-acetaminophen (PERCOCET/ROXICET) 5-325 MG per tablet   Oral   Take 1 tablet by mouth every 4 (four) hours as needed for pain.   15 tablet   0   . predniSONE (DELTASONE) 10 MG tablet   Oral   Take 2 tablets (20 mg total) by mouth 2 (two) times daily.   20 tablet   0   . tiotropium (SPIRIVA) 18 MCG inhalation capsule   Inhalation   Place 18 mcg into inhaler and inhale daily.         Marland Kitchen warfarin (COUMADIN) 4 MG tablet   Oral   Take 4 mg by mouth every evening.          BP 154/86  Pulse 70  Temp(Src) 98.1 F (36.7 C) (Oral)  Resp 20  Ht 5\' 6"  (1.676 m)  Wt 230 lb (104.327 kg)  BMI  37.14 kg/m2  SpO2 100%  LMP 02/08/2007 Physical Exam  Nursing note and vitals reviewed. Constitutional: She is oriented to person, place, and time. She appears well-developed and well-nourished. No distress.  HENT:  Head: Normocephalic.  Right Ear: Tympanic membrane normal.  Left Ear: Tympanic membrane normal.  Mouth/Throat: Uvula is midline, oropharynx is clear and moist and mucous membranes are normal. Dental caries present.    Eyes: EOM are normal.  Neck: Neck supple.  Pulmonary/Chest: Effort normal.  Abdominal: Soft. There is no tenderness.  Musculoskeletal: Normal range of motion.  Neurological: She is alert and oriented to person, place, and time. No cranial nerve deficit.  Skin: Skin is warm and dry.    ED Course   Procedures  MDM  47 y.o. female with dental pain due to caries. Will treat with antibiotics and pain medication and she will follow up with the dental clinic as soon as possible.  Discussed with the patient plan of care and all questioned fully answered. She will return if any problems arise.    Medication List    TAKE these medications       amoxicillin 500 MG capsule  Commonly known as:  AMOXIL  Take 1 capsule (500 mg total) by mouth 3 (three) times daily.     HYDROcodone-acetaminophen 5-325 MG per tablet  Commonly known as:  NORCO/VICODIN  Take 1 tablet by mouth every 4 (four) hours as needed.      ASK your doctor about these medications       albuterol 108 (90 BASE) MCG/ACT inhaler  Commonly known as:  PROVENTIL HFA;VENTOLIN HFA  Inhale 2 puffs into the lungs every 6 (six) hours as needed for wheezing or shortness of breath.     atenolol 50 MG tablet  Commonly known as:  TENORMIN  Take 100 mg by mouth daily.     budesonide-formoterol 80-4.5 MCG/ACT inhaler  Commonly known as:  SYMBICORT  Inhale 2 puffs into the lungs 2 (two) times daily.     methocarbamol 500 MG tablet  Commonly known as:  ROBAXIN  Take 1 tablet (500 mg total) by mouth  2 (two) times daily.     orphenadrine 100 MG tablet  Commonly known as:  NORFLEX  Take 1  tablet (100 mg total) by mouth 2 (two) times daily.     tiotropium 18 MCG inhalation capsule  Commonly known as:  SPIRIVA  Place 18 mcg into inhaler and inhale daily.     warfarin 4 MG tablet  Commonly known as:  COUMADIN  Take 4 mg by mouth every evening.         [address] [employer], [place] 07/09/13 [phone]

## 2013-07-11 NOTE — ED Provider Notes (Signed)
Medical screening examination/treatment/procedure(s) were performed by non-physician practitioner and as supervising physician I was immediately available for consultation/collaboration.   Laray Anger, DO 07/11/13 1224

## 2013-07-15 ENCOUNTER — Observation Stay (HOSPITAL_COMMUNITY)
Admission: EM | Admit: 2013-07-15 | Discharge: 2013-07-16 | Disposition: A | Discharge status: Home / Self Care | Payer: Medicaid Other | Attending: Internal Medicine | Admitting: Internal Medicine

## 2013-07-15 ENCOUNTER — Encounter (HOSPITAL_COMMUNITY): Payer: Self-pay

## 2013-07-15 DIAGNOSIS — M545 Low back pain, unspecified: Secondary | ICD-10-CM

## 2013-07-15 DIAGNOSIS — R079 Chest pain, unspecified: Secondary | ICD-10-CM

## 2013-07-15 DIAGNOSIS — F418 Other specified anxiety disorders: Secondary | ICD-10-CM

## 2013-07-15 DIAGNOSIS — R55 Syncope and collapse: Secondary | ICD-10-CM

## 2013-07-15 DIAGNOSIS — R197 Diarrhea, unspecified: Secondary | ICD-10-CM

## 2013-07-15 DIAGNOSIS — J4489 Other specified chronic obstructive pulmonary disease: Secondary | ICD-10-CM | POA: Insufficient documentation

## 2013-07-15 DIAGNOSIS — B182 Chronic viral hepatitis C: Secondary | ICD-10-CM

## 2013-07-15 DIAGNOSIS — I5022 Chronic systolic (congestive) heart failure: Secondary | ICD-10-CM

## 2013-07-15 DIAGNOSIS — J449 Chronic obstructive pulmonary disease, unspecified: Secondary | ICD-10-CM

## 2013-07-15 DIAGNOSIS — J90 Pleural effusion, not elsewhere classified: Secondary | ICD-10-CM

## 2013-07-15 DIAGNOSIS — E876 Hypokalemia: Secondary | ICD-10-CM

## 2013-07-15 DIAGNOSIS — I509 Heart failure, unspecified: Secondary | ICD-10-CM | POA: Insufficient documentation

## 2013-07-15 DIAGNOSIS — R0602 Shortness of breath: Secondary | ICD-10-CM

## 2013-07-15 DIAGNOSIS — I4891 Unspecified atrial fibrillation: Secondary | ICD-10-CM

## 2013-07-15 DIAGNOSIS — L989 Disorder of the skin and subcutaneous tissue, unspecified: Secondary | ICD-10-CM

## 2013-07-15 DIAGNOSIS — J189 Pneumonia, unspecified organism: Secondary | ICD-10-CM

## 2013-07-15 DIAGNOSIS — F341 Dysthymic disorder: Secondary | ICD-10-CM | POA: Insufficient documentation

## 2013-07-15 DIAGNOSIS — I5043 Acute on chronic combined systolic (congestive) and diastolic (congestive) heart failure: Principal | ICD-10-CM

## 2013-07-15 DIAGNOSIS — Z950 Presence of cardiac pacemaker: Secondary | ICD-10-CM

## 2013-07-15 DIAGNOSIS — F17200 Nicotine dependence, unspecified, uncomplicated: Secondary | ICD-10-CM | POA: Insufficient documentation

## 2013-07-15 DIAGNOSIS — Z720 Tobacco use: Secondary | ICD-10-CM

## 2013-07-15 DIAGNOSIS — K7460 Unspecified cirrhosis of liver: Secondary | ICD-10-CM

## 2013-07-15 DIAGNOSIS — I2510 Atherosclerotic heart disease of native coronary artery without angina pectoris: Secondary | ICD-10-CM

## 2013-07-15 NOTE — ED Notes (Signed)
Patient states that she is on O2 at 2L via [place] but that she is supposed to be on 3L per [facility]. States that she does not wear 3L because the [place] bothers her. States that she has a pacemaker.

## 2013-07-15 NOTE — ED Notes (Signed)
I have been gasping for breath when I get up to go to the bathroom, can't control my water at all, my pull ups are soaked per pt. There is a scratch on her right upper leg that is red, she has not been able to lay on it per daughter-in-law.

## 2013-07-16 ENCOUNTER — Encounter (HOSPITAL_COMMUNITY): Payer: Self-pay | Admitting: Internal Medicine

## 2013-07-16 ENCOUNTER — Emergency Department (HOSPITAL_COMMUNITY): Payer: Medicaid Other

## 2013-07-16 DIAGNOSIS — Z720 Tobacco use: Secondary | ICD-10-CM | POA: Diagnosis present

## 2013-07-16 DIAGNOSIS — J90 Pleural effusion, not elsewhere classified: Secondary | ICD-10-CM | POA: Diagnosis present

## 2013-07-16 DIAGNOSIS — I509 Heart failure, unspecified: Secondary | ICD-10-CM

## 2013-07-16 DIAGNOSIS — R0602 Shortness of breath: Secondary | ICD-10-CM | POA: Diagnosis present

## 2013-07-16 DIAGNOSIS — R197 Diarrhea, unspecified: Secondary | ICD-10-CM | POA: Diagnosis present

## 2013-07-16 DIAGNOSIS — F418 Other specified anxiety disorders: Secondary | ICD-10-CM | POA: Diagnosis present

## 2013-07-16 DIAGNOSIS — I5022 Chronic systolic (congestive) heart failure: Secondary | ICD-10-CM

## 2013-07-16 DIAGNOSIS — F341 Dysthymic disorder: Secondary | ICD-10-CM

## 2013-07-16 DIAGNOSIS — I5043 Acute on chronic combined systolic (congestive) and diastolic (congestive) heart failure: Secondary | ICD-10-CM

## 2013-07-16 LAB — COMPREHENSIVE METABOLIC PANEL
ALT: 27 U/L (ref 0–35)
AST: 40 U/L — ABNORMAL HIGH (ref 0–37)
Alkaline Phosphatase: 72 U/L (ref 39–117)
CO2: 34 mEq/L — ABNORMAL HIGH (ref 19–32)
GFR calc Af Amer: >90 mL/min (ref >90)
GFR calc non Af Amer: >90 mL/min (ref >90)
Glucose, Bld: 107 mg/dL — ABNORMAL HIGH (ref 70–99)
Potassium: 4 mEq/L (ref 3.5–5.1)
Sodium: 140 mEq/L (ref 135–145)
Total Protein: 6.9 g/dL (ref 6.0–8.3)

## 2013-07-16 LAB — CBC WITH DIFFERENTIAL/PLATELET
Basophils Absolute: 0 10*3/uL (ref 0.0–0.1)
HCT: 37.1 % (ref 36.0–46.0)
Hemoglobin: 11.9 g/dL — ABNORMAL LOW (ref 12.0–15.0)
Lymphocytes Relative: 28 % (ref 12–46)
Lymphs Abs: 1.9 10*3/uL (ref 0.7–4.0)
MCV: 99.2 fL (ref 78.0–100.0)
Neutrophils Relative %: 58 % (ref 43–77)
Platelets: 180 10*3/uL (ref 150–400)
RDW: 16.9 % — ABNORMAL HIGH (ref 11.5–15.5)

## 2013-07-16 LAB — PROTIME-INR: INR: 1.27 (ref 0.00–1.49)

## 2013-07-16 MED ORDER — FUROSEMIDE 10 MG/ML IJ SOLN
40.0000 mg | Freq: Two times a day (BID) | INTRAMUSCULAR | Status: DC
  Administered 2013-07-16: 40 mg via INTRAVENOUS
  Filled 2013-07-16: qty 4

## 2013-07-16 MED ORDER — ONDANSETRON HCL 4 MG/2ML IJ SOLN: 4.0000 mg | Freq: Three times a day (TID) | INTRAMUSCULAR | Status: DC | PRN

## 2013-07-16 MED ORDER — ONDANSETRON HCL 4 MG/2ML IJ SOLN: 4.0000 mg | INTRAMUSCULAR | Status: DC | PRN

## 2013-07-16 MED ORDER — ACETAMINOPHEN 325 MG PO TABS: 650.0000 mg | ORAL_TABLET | ORAL | Status: DC | PRN

## 2013-07-16 MED ORDER — SODIUM CHLORIDE 0.9 % IV SOLN: 250.0000 mL | INTRAVENOUS | Status: DC | PRN

## 2013-07-16 MED ORDER — SODIUM CHLORIDE 0.9 % IJ SOLN: 3.0000 mL | INTRAMUSCULAR | Status: DC | PRN

## 2013-07-16 MED ORDER — ALBUTEROL SULFATE HFA 108 (90 BASE) MCG/ACT IN AERS: 2.0000 | INHALATION_SPRAY | Freq: Four times a day (QID) | RESPIRATORY_TRACT | Status: DC | PRN

## 2013-07-16 MED ORDER — IPRATROPIUM BROMIDE 0.02 % IN SOLN
0.5000 mg | Freq: Once | RESPIRATORY_TRACT | Status: AC
  Administered 2013-07-16: 0.5 mg via RESPIRATORY_TRACT
  Filled 2013-07-16: qty 2.5

## 2013-07-16 MED ORDER — ALBUTEROL SULFATE (5 MG/ML) 0.5% IN NEBU
5.0000 mg | INHALATION_SOLUTION | Freq: Once | RESPIRATORY_TRACT | Status: AC
  Administered 2013-07-16: 5 mg via RESPIRATORY_TRACT
  Filled 2013-07-16: qty 1

## 2013-07-16 MED ORDER — FUROSEMIDE 10 MG/ML IJ SOLN
80.0000 mg | Freq: Once | INTRAMUSCULAR | Status: AC
  Administered 2013-07-16: 80 mg via INTRAVENOUS
  Filled 2013-07-16: qty 8

## 2013-07-16 MED ORDER — SODIUM CHLORIDE 0.9 % IJ SOLN
3.0000 mL | Freq: Two times a day (BID) | INTRAMUSCULAR | Status: DC
  Administered 2013-07-16: 3 mL via INTRAVENOUS

## 2013-07-16 MED ORDER — ATENOLOL 25 MG PO TABS
100.0000 mg | ORAL_TABLET | Freq: Every day | ORAL | Status: DC
  Administered 2013-07-16: 100 mg via ORAL
  Filled 2013-07-16: qty 4

## 2013-07-16 MED ORDER — HYOSCYAMINE SULFATE 0.125 MG SL SUBL
0.1250 mg | SUBLINGUAL_TABLET | Freq: Once | SUBLINGUAL | Status: AC
  Administered 2013-07-16: 0.125 mg via ORAL
  Filled 2013-07-16: qty 1

## 2013-07-16 MED ORDER — WARFARIN - PHARMACIST DOSING INPATIENT: Status: DC

## 2013-07-16 MED ORDER — ALBUTEROL SULFATE (5 MG/ML) 0.5% IN NEBU: 2.5000 mg | INHALATION_SOLUTION | RESPIRATORY_TRACT | Status: DC | PRN

## 2013-07-16 MED ORDER — WARFARIN SODIUM 6 MG PO TABS: 6.0000 mg | ORAL_TABLET | Freq: Once | ORAL | Status: DC

## 2013-07-16 MED ORDER — TIOTROPIUM BROMIDE MONOHYDRATE 18 MCG IN CAPS
18.0000 ug | ORAL_CAPSULE | Freq: Every day | RESPIRATORY_TRACT | Status: DC
  Administered 2013-07-16: 18 ug via RESPIRATORY_TRACT
  Filled 2013-07-16: qty 5

## 2013-07-16 MED ORDER — HYDROCODONE-ACETAMINOPHEN 5-325 MG PO TABS
1.0000 | ORAL_TABLET | ORAL | Status: DC | PRN
  Administered 2013-07-16: 1 via ORAL
  Filled 2013-07-16: qty 1

## 2013-07-16 MED ORDER — POTASSIUM CHLORIDE CRYS ER 20 MEQ PO TBCR
40.0000 meq | EXTENDED_RELEASE_TABLET | Freq: Two times a day (BID) | ORAL | Status: DC
  Administered 2013-07-16: 40 meq via ORAL
  Filled 2013-07-16: qty 2

## 2013-07-16 MED ORDER — BUDESONIDE-FORMOTEROL FUMARATE 80-4.5 MCG/ACT IN AERO
2.0000 | INHALATION_SPRAY | Freq: Two times a day (BID) | RESPIRATORY_TRACT | Status: DC
  Administered 2013-07-16: 2 via RESPIRATORY_TRACT
  Filled 2013-07-16: qty 6.9

## 2013-07-16 MED ORDER — MORPHINE SULFATE 4 MG/ML IJ SOLN
4.0000 mg | Freq: Once | INTRAMUSCULAR | Status: AC
  Administered 2013-07-16: 4 mg via INTRAVENOUS
  Filled 2013-07-16: qty 1

## 2013-07-16 MED ORDER — OXYCODONE-ACETAMINOPHEN 5-325 MG PO TABS
2.0000 | ORAL_TABLET | Freq: Once | ORAL | Status: AC
  Administered 2013-07-16: 2 via ORAL
  Filled 2013-07-16: qty 2

## 2013-07-16 NOTE — ED Notes (Signed)
CRITICAL VALUE ALERT  Critical value received:  Troponin 0.47  Date of notification:  07/16/2013  Time of notification:  0150  Critical value read back:yes  Nurse who received alert:  Bennetta Laos  MD notified (1st page):  bonk  Time of first page:   MD notified (2nd page):  Time of second page:  Responding MD:  bonk  Time MD responded:  [phone]

## 2013-07-16 NOTE — Plan of Care (Signed)
Problem: Phase III Progression Outcomes Goal: Fluid volume status improved Educated on fluid restrictions in hospital and need to maintain at home too.

## 2013-07-16 NOTE — ED Notes (Signed)
Daughter in law states that the patient is complaining of pain in the left side of her neck.

## 2013-07-16 NOTE — ED Notes (Signed)
Patient states that she is having abdominal pain, cramping, Dr. Rulon Abide notified.

## 2013-07-16 NOTE — ED Notes (Signed)
94% on RA while ambulating

## 2013-07-16 NOTE — ED Provider Notes (Signed)
CSN: [id number]     Arrival date & time 07/15/13  2321 History     First MD Initiated Contact with Patient 07/15/13 2359     Chief Complaint  Patient presents with  . Urinary Incontinence  . Shortness of Breath   HPI Kim [name] is a 47 y.o. female presents with multiple complaints, patient is also out of her home Percocet medication.  Patient chief complaint is shortness of breath, patient is supposed to be on 3 L of oxygen via nasal cannula for COPD, she is still a current smoker, she also has history of CHF, MI and cirrhosis.  Patient currently still smoking. No productive cough, fever, chills.  She's also complaining about chest pain, this is sharp, intermittent, feels like her chronic chest pain, in the center of her chest, not associated with diaphoresis, radiation, nausea, vomiting, abdominal pain. Patient also complaining about urinary incontinence. She says "I can't control my water" she says she's only urinated twice today however, she is taking Lasix, denies any polyuria or polydipsia.   Past Medical History  Diagnosis Date  . Pacemaker     vent paced  . COPD (chronic obstructive pulmonary disease)   . CHF (congestive heart failure)   . Thyroid disease   . Hepatitis     Hep C  . MI (myocardial infarction)   . Pneumonia   . Dysrhythmia     atrial fibrilation  . Cirrhosis   . Shortness of breath   . Diabetes mellitus   . Chest pain   . GERD (gastroesophageal reflux disease)   . Headache(784.0)   . Anxiety   . A-fib   . Chronic pain    Past Surgical History  Procedure Laterality Date  . Cholecystectomy    . Pacemaker insertion    . Insert / replace / remove pacemaker      02/2012   Family History  Problem Relation Age of Onset  . Coronary artery disease Father 21  . Coronary artery disease Mother 64  . Coronary artery disease Brother    History  Substance Use Topics  . Smoking status: Current Every Day Smoker -- 0.50 packs/day for 30 years    Types:  Cigarettes  . Smokeless tobacco: Current User    Types: Snuff  . Alcohol Use: No   OB History   Grav Para Term Preterm Abortions TAB SAB Ect Mult Living                 Review of Systems At least 10pt or greater review of systems completed and are negative except where specified in the HPI.  Allergies  Amoxil; Doxycycline; Flexeril; Nitroglycerin; Tramadol; and Vesicare  Home Medications   Current Outpatient Rx  Name  Route  Sig  Dispense  Refill  . albuterol (PROVENTIL HFA;VENTOLIN HFA) 108 (90 BASE) MCG/ACT inhaler   Inhalation   Inhale 2 puffs into the lungs every 6 (six) hours as needed for wheezing or shortness of breath.          Marland Kitchen atenolol (TENORMIN) 50 MG tablet   Oral   Take 100 mg by mouth daily.          . budesonide-formoterol (SYMBICORT) 80-4.5 MCG/ACT inhaler   Inhalation   Inhale 2 puffs into the lungs 2 (two) times daily.         . furosemide (LASIX) 80 MG tablet   Oral   Take 80 mg by mouth 2 (two) times daily.         Marland Kitchen  HYDROcodone-acetaminophen (NORCO/VICODIN) 5-325 MG per tablet   Oral   Take 1 tablet by mouth every 4 (four) hours as needed.   15 tablet   0   . tiotropium (SPIRIVA) 18 MCG inhalation capsule   Inhalation   Place 18 mcg into inhaler and inhale daily.         Marland Kitchen warfarin (COUMADIN) 4 MG tablet   Oral   Take 4 mg by mouth every evening.         Marland Kitchen amoxicillin (AMOXIL) 500 MG capsule   Oral   Take 1 capsule (500 mg total) by mouth 3 (three) times daily.   21 capsule   0   . methocarbamol (ROBAXIN) 500 MG tablet   Oral   Take 1 tablet (500 mg total) by mouth 2 (two) times daily.   20 tablet   0   . orphenadrine (NORFLEX) 100 MG tablet   Oral   Take 1 tablet (100 mg total) by mouth 2 (two) times daily.   10 tablet   0    BP 158/83  Pulse 76  Temp(Src) 97.6 F (36.4 C) (Oral)  Resp 20  Ht 5\' 6"  (1.676 m)  Wt 232 lb (105.235 kg)  BMI 37.46 kg/m2  SpO2 97%  LMP 02/08/2007 Physical Exam  Nursing notes  reviewed.  Electronic medical record reviewed. VITAL SIGNS:   Filed Vitals:   07/15/13 2335  BP: 158/83  Pulse: 76  Temp: 97.6 F (36.4 C)  TempSrc: Oral  Resp: 20  Height: 5\' 6"  (1.676 m)  Weight: 232 lb (105.235 kg)  SpO2: 97%   CONSTITUTIONAL: Awake, oriented, appears older than stated age, nontoxic, smells of cigarettes HENT: Atraumatic, normocephalic, oral mucosa pink and moist, airway patent. Nares patent without drainage. External ears normal. EYES: Conjunctiva clear, EOMI, PERRLA NECK: Trachea midline, non-tender, supple CARDIOVASCULAR: Normal heart rate, Normal rhythm, No murmurs, rubs, gallops PULMONARY/CHEST: Fair air movement bilaterally, wheezing bilaterally. Non-tender. ABDOMINAL: Non-distended, soft, non-tender - no rebound or guarding.  BS normal. NEUROLOGIC: Non-focal, moving all four extremities, no gross sensory or motor deficits. EXTREMITIES: No clubbing, cyanosis, or edema SKIN: Warm, Dry, No erythema, No rash  ED Course   Procedures (including critical care time)  Date: 07/16/2013  Rate: 72  Rhythm: Ventricular paced, atrial flutter  QRS Axis: Indeterminant  Intervals: normal  ST/T Wave abnormalities: normal  Conduction Disutrbances: none  Narrative Interpretation: No significant change from 04/15/2013, no significant ST or T wave abnormality is new or suggestive of acute ischemia or infarction  Labs Reviewed  TROPONIN I  CBC WITH DIFFERENTIAL  COMPREHENSIVE METABOLIC PANEL  PRO B NATRIURETIC PEPTIDE   Dg Chest 2 View  07/16/2013   *RADIOLOGY REPORT*  Clinical Data: Shortness of breath.  CHEST - 2 VIEW  Comparison: Chest radiograph performed 04/15/2013, and CT of the abdomen and pelvis from 04/20/2013  Findings: An increasing small right pleural effusion is noted, with associated airspace opacification.  Underlying mild vascular congestion is noted.  This may reflect asymmetric interstitial edema or right-sided pneumonia.  No pneumothorax is seen.  The  heart is mildly enlarged.  A pacemaker is noted at the left chest wall, with leads ending at the right atrium and right ventricle.  No acute osseous abnormalities are seen.  IMPRESSION: Increasing small right pleural effusion, with associated airspace opacification.  Underlying mild vascular congestion and mild cardiomegaly noted.  This may reflect asymmetric interstitial edema or right-sided pneumonia.  Would perform follow-up chest radiograph after completion of treatment for  acute symptoms, to ensure resolution of pleural effusion.   Original Report Authenticated By: Tonia Ghent, M.D.   1. Chest pain   2. Shortness of breath   3. Pleural effusion   4. Coronary atherosclerosis of native coronary artery   5. Cirrhosis of liver due to hepatitis C   6. Pacemaker   7. Chronic systolic CHF (congestive heart failure)   8. Acute on chronic combined systolic and diastolic heart failure   9. Depression with anxiety   10. Diarrhea   11. Tobacco abuse   12. COPD (chronic obstructive pulmonary disease)   13. Skin lesion   14. Atrial fibrillation   15. Hypokalemia   16. Hypomagnesemia   17. HCAP (healthcare-associated pneumonia)   18. Syncope   19. Low back pain     MDM  KEI LANGHORST is a 47 y.o. female with extensive medical history including COPD, cirrhosis, chest pain shortness of breath presents with chest pain or shortness of breath-likely COPD exacerbation, could be anginal "one, no evidence for this time however patient is chronically elevated troponins. Admit patient for cardiac rule out suspect a primary pulmonary cause such as COPD exacerbation.  Jones Skene, MD 07/17/13 [phone]

## 2013-07-16 NOTE — Plan of Care (Signed)
Pt was unable to have BM in hospital so CDiff screen was not completed.

## 2013-07-16 NOTE — Discharge Summary (Signed)
Physician Discharge Summary  Kim [name] WUJ:[id number] DOB: 06-23-1966 DOA: 07/15/2013  PCP: Provider Not In System  Admit date: 07/15/2013 Discharge date: 07/16/2013  Time spent: Greater than 30 minutes  Recommendations for Outpatient Follow-up:  1. Followup with primary care physician/cardiology  Discharge Diagnoses:  1. Acute on chronic combined systolic and diastolic heart failure, resolved/compensated. 2. Depression and anxiety. 3. Ongoing tobacco abuse. 4. Diarrhea, improving.   Discharge Condition: Stable.  Diet recommendation: This 47 year old lady presented to the hospital with symptoms which were multifactorial and included urinary retention, diarrhea, dyspnea. Please see initial history as outlined below: HPI:  Kim [name] is a 47 y.o. female. Obese lady with multiple medical problems, including chronic atrial flutter with AICD placement, congestive heart failure, coronary artery disease and liver cirrhosis. Apparently came to the emergency room complaining of chest pain and shortness of breath which the emergency room physician did not find very convincing but felt she should be observed overnight for liability issues. Her beta-type natruretic peptide was a little bit elevated above her baseline and she received a dose of intravenous Lasix.  When seen in on the medical ward, the patient reports she came to the emergency room because she has passed no urine at all for 2 days, despite taking Lasix. She felt it may be due to her having diarrhea. She reports more than 20 stools per day for the past 2 days; usually watery and yellow sometimes pasty and brown. Never black or bloody. She denies nausea or vomiting. She is complaining of an intermittent cramping abdominal pain and is requesting morphine for this.  During the interview she got up to go to the bathroom said she had to pass stool; when she came back she said she had not passed any stool, but had passed urine for the first  time in 2 days.  Although she seems very comfortable when pressed she complains of shortness of breath and lower sharp central chest pain, which she describes as 9/10 with no aggravating or relieving factors. She reports she's been having this chest pain ever since she had a pacemaker placed, and at the same chest pain comes on every few months.She denies dizziness or sweating but says in any case her head states dizzy a lot. She also complains of a she gets up to walk her heart rate speeds up.  She reports she does not weight herself she does not have a scale and says she has never been told by her doctors that she needs to weigh herself daily.  She is also very concerned about a sore in the left side of her scalp which has been there for 4 years, and says a dermatologist as told her, " it is a side effect of her antidepressant" which is puzzling to her because she does not take an antidepressant. 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  She admits to being depressed and sometimes feeling suicidal.  Cp stabing 9/10 no aggravating nor relieveing factors; no sweating; stay dizzy headed a lot;  When I get up and walk my heart speeds up;  [facility] Weights   07/15/13 2335 07/16/13 0342  Weight: 105.235 kg (232 lb) 109.68 kg (241 lb 12.8 oz)    History of present illness:  The patient has been admitted overnight and her breathing is improved. She still continues to complain of some diarrhea but his stream is somewhat variable. She did eventually micturate. She has had  no fever. There has not been any vomiting. Since clinically are failure has compensated once more, I think she is stable for discharge home.    Procedures:  None.  Consultations:  None.  Discharge Exam: Filed Vitals:   07/16/13 1040  BP: 162/105  Pulse:   Temp:   Resp:     General: She looks systemically well. She is obese. Cardiovascular: Heart sounds are present without gallop  rhythm. Jugular venous pressure not raised. Respiratory: Lung fields are completely clear. She is alert and orientated. Abdomen is soft and nontender. Bowel sounds are heard.  Discharge Instructions  Discharge Orders   Future Orders Complete By Expires     Diet - low sodium heart healthy  As directed     Increase activity slowly  As directed         Medication List    STOP taking these medications       amoxicillin 500 MG capsule  Commonly known as:  AMOXIL      TAKE these medications       albuterol 108 (90 BASE) MCG/ACT inhaler  Commonly known as:  PROVENTIL HFA;VENTOLIN HFA  Inhale 2 puffs into the lungs every 6 (six) hours as needed for wheezing or shortness of breath.     atenolol 50 MG tablet  Commonly known as:  TENORMIN  Take 100 mg by mouth daily.     budesonide-formoterol 80-4.5 MCG/ACT inhaler  Commonly known as:  SYMBICORT  Inhale 2 puffs into the lungs 2 (two) times daily.     furosemide 80 MG tablet  Commonly known as:  LASIX  Take 80 mg by mouth 2 (two) times daily.     HYDROcodone-acetaminophen 5-325 MG per tablet  Commonly known as:  NORCO/VICODIN  Take 1 tablet by mouth every 4 (four) hours as needed.     tiotropium 18 MCG inhalation capsule  Commonly known as:  SPIRIVA  Place 18 mcg into inhaler and inhale daily.     warfarin 4 MG tablet  Commonly known as:  COUMADIN  Take 4 mg by mouth every evening.       Allergies  Allergen Reactions  . Amoxil (Amoxicillin)     Nausea   . Doxycycline Itching  . Flexeril (Cyclobenzaprine) Other (See Comments)    Sweating, Lightheaded   . Nitroglycerin Other (See Comments)    CAUSED NUMBNESS ALL OVER  . Tramadol Itching  . Vesicare (Solifenacin) Other (See Comments)    Makes my sweat turn yellow       Follow-up Information   Follow up with Provider Not In System.       The results of significant diagnostics from this hospitalization (including imaging, microbiology, ancillary and  laboratory) are listed below for reference.    Significant Diagnostic Studies: Dg Chest 2 View  07/16/2013   *RADIOLOGY REPORT*  Clinical Data: Shortness of breath.  CHEST - 2 VIEW  Comparison: Chest radiograph performed 04/15/2013, and CT of the abdomen and pelvis from 04/20/2013  Findings: An increasing small right pleural effusion is noted, with associated airspace opacification.  Underlying mild vascular congestion is noted.  This may reflect asymmetric interstitial edema or right-sided pneumonia.  No pneumothorax is seen.  The heart is mildly enlarged.  A pacemaker is noted at the left chest wall, with leads ending at the right atrium and right ventricle.  No acute osseous abnormalities are seen.  IMPRESSION: Increasing small right pleural effusion, with associated airspace opacification.  Underlying mild vascular congestion and mild  cardiomegaly noted.  This may reflect asymmetric interstitial edema or right-sided pneumonia.  Would perform follow-up chest radiograph after completion of treatment for acute symptoms, to ensure resolution of pleural effusion.   Original Report Authenticated By: Tonia Ghent, M.D.    Microbiology: No results found for this or any previous visit (from the past 240 hour(s)).   Labs: Basic Metabolic Panel:  Recent Labs Lab 07/16/13 0100  NA 140  K 4.0  CL 101  CO2 34*  GLUCOSE 107*  BUN 6  CREATININE 0.77  CALCIUM 8.7   Liver Function Tests:  Recent Labs Lab 07/16/13 0100  AST 40*  ALT 27  ALKPHOS 72  BILITOT 0.8  PROT 6.9  ALBUMIN 3.1*     CBC:  Recent Labs Lab 07/16/13 0100  WBC 6.9  NEUTROABS 4.0  HGB 11.9*  HCT 37.1  MCV 99.2  PLT 180   Cardiac Enzymes:  Recent Labs Lab 07/16/13 0100 07/16/13 0650  TROPONINI 0.47* <0.30   BNP: BNP (last 3 results)  Recent Labs  03/01/13 2305 03/05/13 1725 07/16/13 0100  PROBNP 1358.0* 1327.0* 1656.0*         Signed:  Ellamarie Naeve C  [name] 07/16/2013,  11:34 AM

## 2013-07-16 NOTE — H&P (Signed)
[name] History and Physical  Kim [name]  ZOX:[id number]  DOB: [birth year]   DOA: 07/16/2013   PCP:   Provider Not In System  Primary care physician and cardiologist in [facility]  Chief Complaint:  Hasn't passed any urine for 2 days  HPI: Kim [name] is a 47 y.o. female. Obese lady with multiple medical problems, including chronic atrial flutter with AICD placement, congestive heart failure, coronary artery disease and liver cirrhosis. Apparently came to the emergency room complaining of chest pain and shortness of breath which the emergency room physician did not find very convincing but felt she should be observed overnight for liability issues. Her beta-type natruretic peptide was a little bit elevated above her baseline and she received a dose of intravenous Lasix.  When seen in on the medical ward, the patient reports she came to the emergency room because she has passed no urine at all for 2 days, despite taking Lasix. She felt it may be due to her having diarrhea. She reports more than 20 stools per day for the past 2 days; usually watery and yellow sometimes pasty and brown. Never black or bloody. She denies nausea or vomiting. She is complaining of an intermittent cramping abdominal pain and is requesting morphine for this.   During the interview she got up to go to the bathroom said she had to pass stool; when she came back she said she had not passed any stool, but had passed urine for the first time in 2 days.  Although she seems very comfortable when pressed she complains of shortness of breath and lower sharp central chest pain, which she describes as 9/10 with no aggravating or relieving factors. She reports she's been having this chest pain ever since she had a pacemaker placed, and at the same chest pain comes on every few months.She denies dizziness or sweating but says in any case her head states dizzy a lot. She also complains of a she gets up to walk her  heart rate speeds up.  She reports she does not weight herself she does not have a scale and says she has never been told by her doctors that she needs to weigh herself daily.  She is also very concerned about a sore in the left side of her scalp which has been there for 4 years, and says a dermatologist as told her, " it is a side effect of her antidepressant" which is puzzling to her because she does not take an antidepressant. 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  She admits to being depressed and sometimes feeling suicidal.   Cp stabing 9/10 no aggravating nor relieveing factors; no sweating; stay dizzy headed a lot;  When I get up and walk my heart speeds up;  Rewiew of Systems:   All systems negative except as marked bold or noted in the HPI;  Constitutional:    malaise, fever and chills. ;  Eyes:   eye pain, redness and discharge. ;  ENMT:   ear pain, hoarseness, nasal congestion, post nasal drip; sinus pressure and sore throat. ;  Cardiovascular:    chest pain, palpitations, diaphoresis, dyspnea and peripheral edema.  Respiratory:   cough, hemoptysis, wheezing and stridor. ;  Gastrointestinal:  nausea, vomiting, diarrhea, constipation, abdominal pain, melena, blood in stool, hematemesis, jaundice and rectal bleeding. unusual weight loss..   Genitourinary:    frequency, dysuria, incontinence,flank pain and hematuria; Musculoskeletal:  back pain and neck pain.  swelling and trauma.;  Skin: .  Sore in head x 4 yers; pruritus, rash, abrasions, bruising and skin lesion.; ulcerations Neuro:    headache, lightheadedness and neck stiffness.  weakness, altered level of consciousness, altered mental status, extremity weakness, burning feet, involuntary movement, seizure and syncope.  Psych:    anxiety, depression, insomnia, tearfulness, panic attacks, hallucinations, paranoia, suicidal or homicidal ideation;  episodically thinks about killing  herself, but has never made any differential definite plans   Past Medical History  Diagnosis Date  . Pacemaker     vent paced  . COPD (chronic obstructive pulmonary disease)   . CHF (congestive heart failure)   . Thyroid disease   . Hepatitis     Hep C  . MI (myocardial infarction)   . Pneumonia   . Dysrhythmia     atrial fibrilation  . Cirrhosis   . Shortness of breath   . Diabetes mellitus   . Chest pain   . GERD (gastroesophageal reflux disease)   . Headache(784.0)   . Anxiety   . A-fib   . Chronic pain     Past Surgical History  Procedure Laterality Date  . Cholecystectomy    . Pacemaker insertion    . Insert / replace / remove pacemaker      02/2012    Medications:  HOME MEDS: Prior to Admission medications   Medication Sig Start Date End Date Taking? Authorizing Provider  albuterol (PROVENTIL HFA;VENTOLIN HFA) 108 (90 BASE) MCG/ACT inhaler Inhale 2 puffs into the lungs every 6 (six) hours as needed for wheezing or shortness of breath.    Yes Historical Provider, MD  atenolol (TENORMIN) 50 MG tablet Take 100 mg by mouth daily.    Yes Historical Provider, MD  budesonide-formoterol (SYMBICORT) 80-4.5 MCG/ACT inhaler Inhale 2 puffs into the lungs 2 (two) times daily.   Yes Historical Provider, MD  furosemide (LASIX) 80 MG tablet Take 80 mg by mouth 2 (two) times daily.   Yes Historical Provider, MD  HYDROcodone-acetaminophen (NORCO/VICODIN) 5-325 MG per tablet Take 1 tablet by mouth every 4 (four) hours as needed. 07/08/13  Yes Hope Orlene Och, NP  tiotropium (SPIRIVA) 18 MCG inhalation capsule Place 18 mcg into inhaler and inhale daily.   Yes Historical Provider, MD  warfarin (COUMADIN) 4 MG tablet Take 4 mg by mouth every evening.   Yes Historical Provider, MD  amoxicillin (AMOXIL) 500 MG capsule Take 1 capsule (500 mg total) by mouth 3 (three) times daily. 07/08/13   Hope Orlene Och, NP  methocarbamol (ROBAXIN) 500 MG tablet Take 1 tablet (500 mg total) by mouth 2 (two)  times daily. 06/25/13   Hope Orlene Och, NP  orphenadrine (NORFLEX) 100 MG tablet Take 1 tablet (100 mg total) by mouth 2 (two) times daily. 07/02/13   Enid Skeens, MD     Allergies:  Allergies  Allergen Reactions  . Amoxil (Amoxicillin)     Nausea   . Doxycycline Itching  . Flexeril (Cyclobenzaprine) Other (See Comments)    Sweating, Lightheaded   . Nitroglycerin Other (See Comments)    CAUSED NUMBNESS ALL OVER  . Tramadol Itching  . Vesicare (Solifenacin) Other (See Comments)    Makes my sweat turn yellow    Social History:   reports that she has been smoking Cigarettes.  She has a 15 pack-year smoking history. Her smokeless tobacco use includes Snuff. She reports that she does not drink alcohol or use illicit drugs.  Family History: Family History  Problem Relation Age of Onset  . Coronary artery disease Father 76  . Coronary artery disease Mother 2  . Coronary artery disease Brother      Physical Exam: Filed Vitals:   07/15/13 2335 07/16/13 0100  BP: 158/83 145/86  Pulse: 76 73  Temp: 97.6 F (36.4 C)   TempSrc: Oral   Resp: 20 26  Height: 5\' 6"  (1.676 m)   Weight: 105.235 kg (232 lb)   SpO2: 97% 100%   Blood pressure 145/86, pulse 73, temperature 97.6 F (36.4 C), temperature source Oral, resp. rate 26, height 5\' 6"  (1.676 m), weight 105.235 kg (232 lb), last menstrual period 02/08/2007, SpO2 100.00%. Body mass index is 37.46 kg/(m^2).   GEN:  Pleasant obese middle-aged lady lying bed in no acute distress; very comfortable and nontoxic appearing; cooperative with exam PSYCH:  alert and oriented x4;  seems a little anxious; affect is flat. HEENT: Mucous membranes pink and anicteric; PERRLA; EOM intact; no cervical lymphadenopathy nor thyromegaly or carotid bruit; ; Breasts:: Not examined CHEST WALL: No tenderness CHEST: Normal respiration, clear to auscultation bilaterally HEART: Irregular rhythm; no murmurs rubs or gallops BACK: No kyphosis no scoliosis;  no CVA tenderness ABDOMEN: Obese, soft non-tender; no masses, no organomegaly, normal abdominal bowel sounds; moderate pannus; no intertriginous candida. Rectal Exam: Not done EXTREMITIES: No bone or joint deformity; age-appropriate arthropathy of the hands and knees; no edema; no ulcerations. Genitalia: not examined PULSES: 2+ and symmetric SKIN: Healing ulcers on scalp look as if they're produced by a persistent itching CNS: Cranial nerves 2-12 grossly intact no focal lateralizing neurologic deficit   Labs on Admission:  Basic Metabolic Panel:  Recent Labs Lab 07/16/13 0100  NA 140  K 4.0  CL 101  CO2 34*  GLUCOSE 107*  BUN 6  CREATININE 0.77  CALCIUM 8.7   Liver Function Tests:  Recent Labs Lab 07/16/13 0100  AST 40*  ALT 27  ALKPHOS 72  BILITOT 0.8  PROT 6.9  ALBUMIN 3.1*   No results found for this basename: LIPASE, AMYLASE,  in the last 168 hours No results found for this basename: AMMONIA,  in the last 168 hours CBC:  Recent Labs Lab 07/16/13 0100  WBC 6.9  NEUTROABS 4.0  HGB 11.9*  HCT 37.1  MCV 99.2  PLT 180   Cardiac Enzymes:  Recent Labs Lab 07/16/13 0100  TROPONINI 0.47*   BNP: No components found with this basename: POCBNP,  D-dimer: No components found with this basename: D-DIMER,  CBG: No results found for this basename: GLUCAP,  in the last 168 hours  Radiological Exams on Admission: Dg Chest 2 View  07/16/2013   *RADIOLOGY REPORT*  Clinical Data: Shortness of breath.  CHEST - 2 VIEW  Comparison: Chest radiograph performed 04/15/2013, and CT of the abdomen and pelvis from 04/20/2013  Findings: An increasing small right pleural effusion is noted, with associated airspace opacification.  Underlying mild vascular congestion is noted.  This may reflect asymmetric interstitial edema or right-sided pneumonia.  No pneumothorax is seen.  The heart is mildly enlarged.  A pacemaker is noted at the left chest wall, with leads ending at the right  atrium and right ventricle.  No acute osseous abnormalities are seen.  IMPRESSION: Increasing small right pleural effusion, with associated airspace opacification.  Underlying mild vascular congestion and mild cardiomegaly noted.  This may reflect asymmetric interstitial edema or right-sided pneumonia.  Would perform follow-up chest radiograph after completion of  treatment for acute symptoms, to ensure resolution of pleural effusion.   Original Report Authenticated By: Tonia Ghent, M.D.    EKG: Independently reviewed. Ventricular paced rhythm; atrial flutter present   Assessment/Plan  Active Problems:   Acute on chronic combined systolic and diastolic heart failure   Shortness of breath   Pleural effusion   Diarrhea   Depression with anxiety   Tobacco abuse  PLAN:  Although this lady is predominant symptoms seem to be either produced or exaggerated by her depression, will treat her primarily as acute exacerbation of her CHF, with intravenous Lasix. She does not appear to be on an ACE inhibitor, and since she seen a cardiologist at presumed there is a good reason and will not jump to start one on tilt reviewing her chart further.  Consult on nicotine cessation Cycle cardiac enzymes because of a history of chest pain Will order stool for C. Difficile, but will not start Flagyl since her history is not very convincing  Other plans as per orders.  Code Status: Full code Family Communication: Plans discuss with patient and family at bedside Disposition Plan:  Depending on response to initial therapy    Saben Donigan Nocturnist [name] Pager [phone]   07/16/2013, 2:41 AM

## 2013-07-16 NOTE — Discharge Summary (Signed)
Pt stated she was ready to go home and pain was under control except for some cramps.  Pt's IV and tele were removed before DC and she was also given 1 time dose of Levsin SL for cramps.  Pt was educated about DC papers and also CHF.  She stated she already has a scale at home and will begin weighing daily to keep track of fluids.  Pt also asked to maintain cardiac/low sodium diet and fluid restrictions.  Pt will be wheeled to car by PCT and family.

## 2013-07-16 NOTE — Plan of Care (Signed)
Dr. Karilyn Cota notified about critical Troponin value for pt of 0.32.  Dr. Charline Bills that he was still ok with discharging pt.

## 2013-07-16 NOTE — Progress Notes (Signed)
ANTICOAGULATION CONSULT NOTE - Initial Consult  Pharmacy Consult for Warfarin Indication: atrial fibrillation  Allergies  Allergen Reactions  . Amoxil (Amoxicillin)     Nausea   . Doxycycline Itching  . Flexeril (Cyclobenzaprine) Other (See Comments)    Sweating, Lightheaded   . Nitroglycerin Other (See Comments)    CAUSED NUMBNESS ALL OVER  . Tramadol Itching  . Vesicare (Solifenacin) Other (See Comments)    Makes my sweat turn yellow    Patient Measurements: Height: 5\' 6"  (167.6 cm) Weight: 241 lb 12.8 oz (109.68 kg) IBW/kg (Calculated) : 59.3   Vital Signs: Temp: 97.9 F (36.6 C) (08/10 0342) Temp src: Oral (08/10 0342) BP: 159/100 mmHg (08/10 0342) Pulse Rate: 78 (08/10 0342)  Labs:  Recent Labs  07/16/13 0100 07/16/13 0650  HGB 11.9*  --   HCT 37.1  --   PLT 180  --   LABPROT 15.6*  --   INR 1.27  --   CREATININE 0.77  --   TROPONINI 0.47* <0.30    Estimated Creatinine Clearance: 107.9 ml/min (by C-G formula based on Cr of 0.77).   Medical History: Past Medical History  Diagnosis Date  . Pacemaker     vent paced  . COPD (chronic obstructive pulmonary disease)   . CHF (congestive heart failure)   . Thyroid disease   . Hepatitis     Hep C  . MI (myocardial infarction)   . Pneumonia   . Dysrhythmia     atrial fibrilation  . Cirrhosis   . Shortness of breath   . Diabetes mellitus   . Chest pain   . GERD (gastroesophageal reflux disease)   . Headache(784.0)   . Anxiety   . A-fib   . Chronic pain     Medications:  Scheduled:  . atenolol  100 mg Oral Daily  . budesonide-formoterol  2 puff Inhalation BID  . furosemide  40 mg Intravenous BID  . potassium chloride  40 mEq Oral BID  . sodium chloride  3 mL Intravenous Q12H  . tiotropium  18 mcg Inhalation Daily    Assessment: Continuation of warfarin from home for atrial fibrillation PTA warfarin 4 mg each evening INR subtherapeutic on admission  Goal of Therapy:  INR 2-3 Monitor  platelets by anticoagulation protocol: Yes   Plan:  Warfarin 6 mg po x 1 dose today, due to subtherapeutic INR INR/PT daily CBC, monitor platelets  Raquel James, Taci Sterling Bennett 07/16/2013,8:18 AM

## 2013-07-21 ENCOUNTER — Emergency Department (HOSPITAL_COMMUNITY)
Admission: EM | Admit: 2013-07-21 | Discharge: 2013-07-21 | Disposition: A | Discharge status: Home / Self Care | Payer: Medicaid Other | Attending: Emergency Medicine | Admitting: Emergency Medicine

## 2013-07-21 ENCOUNTER — Emergency Department (HOSPITAL_COMMUNITY): Payer: Medicaid Other

## 2013-07-21 ENCOUNTER — Encounter (HOSPITAL_COMMUNITY): Payer: Self-pay

## 2013-07-21 DIAGNOSIS — Z7901 Long term (current) use of anticoagulants: Secondary | ICD-10-CM | POA: Insufficient documentation

## 2013-07-21 DIAGNOSIS — J4489 Other specified chronic obstructive pulmonary disease: Secondary | ICD-10-CM | POA: Insufficient documentation

## 2013-07-21 DIAGNOSIS — I2510 Atherosclerotic heart disease of native coronary artery without angina pectoris: Secondary | ICD-10-CM | POA: Insufficient documentation

## 2013-07-21 DIAGNOSIS — Z8701 Personal history of pneumonia (recurrent): Secondary | ICD-10-CM | POA: Insufficient documentation

## 2013-07-21 DIAGNOSIS — Z8659 Personal history of other mental and behavioral disorders: Secondary | ICD-10-CM | POA: Insufficient documentation

## 2013-07-21 DIAGNOSIS — I4891 Unspecified atrial fibrillation: Secondary | ICD-10-CM | POA: Insufficient documentation

## 2013-07-21 DIAGNOSIS — S40022A Contusion of left upper arm, initial encounter: Secondary | ICD-10-CM

## 2013-07-21 DIAGNOSIS — Z950 Presence of cardiac pacemaker: Secondary | ICD-10-CM | POA: Insufficient documentation

## 2013-07-21 DIAGNOSIS — W108XXA Fall (on) (from) other stairs and steps, initial encounter: Secondary | ICD-10-CM | POA: Insufficient documentation

## 2013-07-21 DIAGNOSIS — Z79899 Other long term (current) drug therapy: Secondary | ICD-10-CM | POA: Insufficient documentation

## 2013-07-21 DIAGNOSIS — Z8719 Personal history of other diseases of the digestive system: Secondary | ICD-10-CM | POA: Insufficient documentation

## 2013-07-21 DIAGNOSIS — G8929 Other chronic pain: Secondary | ICD-10-CM | POA: Insufficient documentation

## 2013-07-21 DIAGNOSIS — W010XXA Fall on same level from slipping, tripping and stumbling without subsequent striking against object, initial encounter: Secondary | ICD-10-CM | POA: Insufficient documentation

## 2013-07-21 DIAGNOSIS — S0990XA Unspecified injury of head, initial encounter: Secondary | ICD-10-CM | POA: Insufficient documentation

## 2013-07-21 DIAGNOSIS — Y9389 Activity, other specified: Secondary | ICD-10-CM | POA: Insufficient documentation

## 2013-07-21 DIAGNOSIS — I252 Old myocardial infarction: Secondary | ICD-10-CM | POA: Insufficient documentation

## 2013-07-21 DIAGNOSIS — IMO0002 Reserved for concepts with insufficient information to code with codable children: Secondary | ICD-10-CM | POA: Insufficient documentation

## 2013-07-21 DIAGNOSIS — I509 Heart failure, unspecified: Secondary | ICD-10-CM | POA: Insufficient documentation

## 2013-07-21 DIAGNOSIS — Z8639 Personal history of other endocrine, nutritional and metabolic disease: Secondary | ICD-10-CM | POA: Insufficient documentation

## 2013-07-21 DIAGNOSIS — I499 Cardiac arrhythmia, unspecified: Secondary | ICD-10-CM | POA: Insufficient documentation

## 2013-07-21 DIAGNOSIS — J449 Chronic obstructive pulmonary disease, unspecified: Secondary | ICD-10-CM | POA: Insufficient documentation

## 2013-07-21 DIAGNOSIS — Z86711 Personal history of pulmonary embolism: Secondary | ICD-10-CM | POA: Insufficient documentation

## 2013-07-21 DIAGNOSIS — Y92009 Unspecified place in unspecified non-institutional (private) residence as the place of occurrence of the external cause: Secondary | ICD-10-CM | POA: Insufficient documentation

## 2013-07-21 DIAGNOSIS — Z862 Personal history of diseases of the blood and blood-forming organs and certain disorders involving the immune mechanism: Secondary | ICD-10-CM | POA: Insufficient documentation

## 2013-07-21 DIAGNOSIS — F17200 Nicotine dependence, unspecified, uncomplicated: Secondary | ICD-10-CM | POA: Insufficient documentation

## 2013-07-21 DIAGNOSIS — E119 Type 2 diabetes mellitus without complications: Secondary | ICD-10-CM | POA: Insufficient documentation

## 2013-07-21 DIAGNOSIS — S40029A Contusion of unspecified upper arm, initial encounter: Secondary | ICD-10-CM | POA: Insufficient documentation

## 2013-07-21 DIAGNOSIS — Z8619 Personal history of other infectious and parasitic diseases: Secondary | ICD-10-CM | POA: Insufficient documentation

## 2013-07-21 HISTORY — DX: Other pulmonary embolism without acute cor pulmonale: I26.99

## 2013-07-21 MED ORDER — OXYCODONE HCL 5 MG PO TABS
5.0000 mg | ORAL_TABLET | Freq: Once | ORAL | Status: AC
  Administered 2013-07-21: 5 mg via ORAL
  Filled 2013-07-21: qty 1

## 2013-07-21 MED ORDER — OXYCODONE HCL 5 MG PO TABS: 5.0000 mg | ORAL_TABLET | ORAL | Status: DC | PRN

## 2013-07-21 NOTE — ED Provider Notes (Signed)
CSN: [id number]     Arrival date & time 07/21/13  1818 History     First MD Initiated Contact with Patient 07/21/13 1834     Chief Complaint  Patient presents with  . Fall   (Consider location/radiation/quality/duration/timing/severity/associated sxs/prior Treatment) HPI Comments: Kim [name] is a 47 y.o. Female presenting with left upper arm and elbow pain after slipping from the second step of a 3 step flight walking out of her home this morning.  She landed on her left side and has persistent pain in her left arm and shoulder.  She also is concerned about possible injury or movement of her pacemaker since it is on this side.  She at first stated that she did not hit her head,  Then during the end of the interview,  Decided that "maybe I did".  She denies any loc and denies headache, confusion , dizziness or localized weakness since the fall.  She also denies neck pain.  She has taken no medicines prior to arrival here for pain. She is on coumadin due to history of afib.  Also has a history of copd, cad and diet controlled DM.  She denies any increased shortness of breath from her baseline,  Denies chest pain, nausea, vomiting and abdominal pain.     The history is provided by the patient.    Past Medical History  Diagnosis Date  . Pacemaker     vent paced  . COPD (chronic obstructive pulmonary disease)   . CHF (congestive heart failure)   . Thyroid disease   . Hepatitis     Hep C  . MI (myocardial infarction)   . Pneumonia   . Dysrhythmia     atrial fibrilation  . Cirrhosis   . Shortness of breath   . Diabetes mellitus   . Chest pain   . GERD (gastroesophageal reflux disease)   . Headache(784.0)   . Anxiety   . A-fib   . Chronic pain   . PE (pulmonary embolism)    Past Surgical History  Procedure Laterality Date  . Cholecystectomy    . Pacemaker insertion    . Insert / replace / remove pacemaker      02/2012   Family History  Problem Relation Age of Onset  .  Coronary artery disease Father 52  . Coronary artery disease Mother 51  . Coronary artery disease Brother    History  Substance Use Topics  . Smoking status: Current Every Day Smoker -- 0.50 packs/day for 30 years    Types: Cigarettes  . Smokeless tobacco: Current User    Types: Snuff  . Alcohol Use: No   OB History   Grav Para Term Preterm Abortions TAB SAB Ect Mult Living                 Review of Systems  Constitutional: Negative for fever and chills.  HENT: Negative for congestion, sore throat and neck pain.   Eyes: Negative.   Respiratory: Negative for cough, chest tightness and shortness of breath.   Cardiovascular: Negative for chest pain.  Gastrointestinal: Negative for nausea and abdominal pain.  Genitourinary: Negative.   Musculoskeletal: Positive for arthralgias. Negative for joint swelling.  Skin: Negative.  Negative for rash and wound.  Neurological: Negative for dizziness, weakness, light-headedness, numbness and headaches.  Psychiatric/Behavioral: Negative.     Allergies  Amoxil; Doxycycline; Flexeril; Nitroglycerin; Tramadol; and Vesicare  Home Medications   Current Outpatient Rx  Name  Route  Sig  Dispense  Refill  . atenolol (TENORMIN) 50 MG tablet   Oral   Take 100 mg by mouth daily.          . budesonide-formoterol (SYMBICORT) 80-4.5 MCG/ACT inhaler   Inhalation   Inhale 2 puffs into the lungs 2 (two) times daily.         . furosemide (LASIX) 80 MG tablet   Oral   Take 80 mg by mouth 2 (two) times daily.         Marland Kitchen tiotropium (SPIRIVA) 18 MCG inhalation capsule   Inhalation   Place 18 mcg into inhaler and inhale daily.         Marland Kitchen warfarin (COUMADIN) 4 MG tablet   Oral   Take 4 mg by mouth every evening.         Marland Kitchen albuterol (PROVENTIL HFA;VENTOLIN HFA) 108 (90 BASE) MCG/ACT inhaler   Inhalation   Inhale 2 puffs into the lungs every 6 (six) hours as needed for wheezing or shortness of breath.          . oxyCODONE (OXY  IR/ROXICODONE) 5 MG immediate release tablet   Oral   Take 1 tablet (5 mg total) by mouth every 4 (four) hours as needed for pain.   15 tablet   0    BP 157/94  Pulse 73  Temp(Src) 98.3 F (36.8 C) (Oral)  SpO2 97%  LMP 02/08/2007 Physical Exam  Constitutional: She appears well-developed and well-nourished.  HENT:  Head: Atraumatic.  Neck: Normal range of motion.  Cardiovascular:  Pulses equal bilaterally  Pulmonary/Chest: No respiratory distress. She has no decreased breath sounds. She has no wheezes. She has rales. She exhibits no tenderness.    Faint rales right base.  Abdominal: There is no tenderness.  Musculoskeletal: She exhibits tenderness.       Left upper arm: She exhibits tenderness.       Left forearm: She exhibits bony tenderness. She exhibits no swelling, no edema and no deformity.  ttp mid left humerus, no edema, erythema,  Palpable deformity. Skin intact.  Neurological: She is alert. She has normal strength. She displays normal reflexes. No sensory deficit.  Equal strength  Skin: Skin is warm and dry.  Psychiatric: She has a normal mood and affect.    ED Course   Procedures (including critical care time)  Labs Reviewed - No data to display Dg Chest 2 View  07/21/2013   *RADIOLOGY REPORT*  Clinical Data: Fall, pain  CHEST - 2 VIEW  Comparison: 07/16/2013  Findings: Cardiomegaly again noted.  Dual lead cardiac pacemaker is unchanged in position.  There is small right pleural effusion with left basilar atelectasis or infiltrate.  No diagnostic pneumothorax.  No gross fractures are identified.  IMPRESSION: Cardiomegaly again noted.  Small right pleural effusion with right basilar atelectasis or infiltrate.  No diagnostic pneumothorax.   Original Report Authenticated By: Natasha Mead, M.D.   Dg Forearm Left  07/21/2013   *RADIOLOGY REPORT*  Clinical Data: Fall, arm pain  LEFT FOREARM - 2 VIEW  Comparison: None.  Findings: Two views of the left forearm submitted.   No acute fracture or subluxation.  No radiopaque foreign body.  IMPRESSION: No acute fracture or subluxation.   Original Report Authenticated By: Natasha Mead, M.D.   Ct Head Wo Contrast  07/21/2013   *RADIOLOGY REPORT*  Clinical Data: Fall, forehead injury  CT HEAD WITHOUT CONTRAST  Technique:  Contiguous axial images were obtained from the base of the skull through the  vertex without contrast.  Comparison: None.  Findings: No skull fracture is noted.  Paranasal sinuses and mastoid air cells are unremarkable.  No intracranial hemorrhage, mass effect or midline shift.  No acute infarction.  No hydrocephalus.  The gray and white matter differentiation is preserved.  No mass lesion is noted on this unenhanced scan.  IMPRESSION: No acute intracranial abnormality.   Original Report Authenticated By: Natasha Mead, M.D.   Dg Humerus Left  07/21/2013   *RADIOLOGY REPORT*  Clinical Data: Fall, arm pain  LEFT HUMERUS - 2+ VIEW  Comparison: 03/14/2013  Findings: Two views of the left humerus submitted.  No acute fracture or subluxation.  Partially visualized cardiac pacemaker.  IMPRESSION: No acute fracture or subluxation.   Original Report Authenticated By: Natasha Mead, M.D.   1. Contusion of arm, left, initial encounter     MDM  Chart and previous recent hospitalization reviewed (for chest pain).  Her INR 5 days ago was 1.27.  She states her coumadin was increased and she had her blood drawn ytd by her home health to recheck.  She denies any worsened sob, cough,  No fevers, is at baseline for breathing issues.  Patients labs and/or radiological studies were viewed and considered during the medical decision making and disposition process. No clinical indication for abx today,  No symptoms suggesting pneumonia.  Pt was prescribed oxy IR - states unable to take tylenol due to hep c and cirrhosis.  She was encouraged ice,  Elevation,  Sling given,  Cautioned regarding maintaining mobility in joints,  Pt understands.  Plan  f/u with pcp for recheck in one week if pain sx not better.  Burgess Amor, PA-C 07/21/13 2027

## 2013-07-21 NOTE — ED Notes (Signed)
Pt reports falling this a.m on her left arm.  Pt reports severe pain to her left arm and decreased movement d/t pain.  Pt denies hitting her head or loc.

## 2013-07-21 NOTE — ED Notes (Addendum)
Pt says she tripped and fell this am, Has had pain lt arm since then.  Says she cannot extend her elbow.  Good lt radial pulse  Pt says she did not hit her head

## 2013-07-22 NOTE — ED Provider Notes (Signed)
Medical screening examination/treatment/procedure(s) were performed by non-physician practitioner and as supervising physician I was immediately available for consultation/collaboration.  Donnetta Hutching, MD 07/22/13 [phone]

## 2013-08-23 ENCOUNTER — Encounter (HOSPITAL_COMMUNITY): Payer: Self-pay | Admitting: *Deleted

## 2013-08-23 ENCOUNTER — Emergency Department (HOSPITAL_COMMUNITY)
Admission: EM | Admit: 2013-08-23 | Discharge: 2013-08-23 | Disposition: A | Discharge status: Home / Self Care | Payer: Medicaid Other | Attending: Emergency Medicine | Admitting: Emergency Medicine

## 2013-08-23 DIAGNOSIS — I252 Old myocardial infarction: Secondary | ICD-10-CM | POA: Insufficient documentation

## 2013-08-23 DIAGNOSIS — Z8659 Personal history of other mental and behavioral disorders: Secondary | ICD-10-CM | POA: Insufficient documentation

## 2013-08-23 DIAGNOSIS — Z950 Presence of cardiac pacemaker: Secondary | ICD-10-CM | POA: Insufficient documentation

## 2013-08-23 DIAGNOSIS — J449 Chronic obstructive pulmonary disease, unspecified: Secondary | ICD-10-CM | POA: Insufficient documentation

## 2013-08-23 DIAGNOSIS — I4891 Unspecified atrial fibrillation: Secondary | ICD-10-CM | POA: Insufficient documentation

## 2013-08-23 DIAGNOSIS — Z86711 Personal history of pulmonary embolism: Secondary | ICD-10-CM | POA: Insufficient documentation

## 2013-08-23 DIAGNOSIS — Z8701 Personal history of pneumonia (recurrent): Secondary | ICD-10-CM | POA: Insufficient documentation

## 2013-08-23 DIAGNOSIS — Z8719 Personal history of other diseases of the digestive system: Secondary | ICD-10-CM | POA: Insufficient documentation

## 2013-08-23 DIAGNOSIS — G8929 Other chronic pain: Secondary | ICD-10-CM | POA: Insufficient documentation

## 2013-08-23 DIAGNOSIS — Z79899 Other long term (current) drug therapy: Secondary | ICD-10-CM | POA: Insufficient documentation

## 2013-08-23 DIAGNOSIS — M545 Low back pain, unspecified: Secondary | ICD-10-CM | POA: Insufficient documentation

## 2013-08-23 DIAGNOSIS — Z8619 Personal history of other infectious and parasitic diseases: Secondary | ICD-10-CM | POA: Insufficient documentation

## 2013-08-23 DIAGNOSIS — E119 Type 2 diabetes mellitus without complications: Secondary | ICD-10-CM | POA: Insufficient documentation

## 2013-08-23 DIAGNOSIS — R52 Pain, unspecified: Secondary | ICD-10-CM | POA: Insufficient documentation

## 2013-08-23 DIAGNOSIS — I509 Heart failure, unspecified: Secondary | ICD-10-CM | POA: Insufficient documentation

## 2013-08-23 DIAGNOSIS — F17200 Nicotine dependence, unspecified, uncomplicated: Secondary | ICD-10-CM | POA: Insufficient documentation

## 2013-08-23 DIAGNOSIS — Z7901 Long term (current) use of anticoagulants: Secondary | ICD-10-CM | POA: Insufficient documentation

## 2013-08-23 DIAGNOSIS — J4489 Other specified chronic obstructive pulmonary disease: Secondary | ICD-10-CM | POA: Insufficient documentation

## 2013-08-23 MED ORDER — OXYCODONE HCL 5 MG PO TABS
10.0000 mg | ORAL_TABLET | Freq: Once | ORAL | Status: AC
  Administered 2013-08-23: 10 mg via ORAL
  Filled 2013-08-23: qty 2

## 2013-08-23 NOTE — ED Notes (Signed)
Reporting pain in lower back that radiates into hip.  States pain makes it difficult to lay down.  Reports history of chronic back pain.

## 2013-08-23 NOTE — ED Provider Notes (Signed)
CSN: [id number]     Arrival date & time 08/23/13  1852 History   First MD Initiated Contact with Patient 08/23/13 1915     Chief Complaint  Patient presents with  . Back Pain   (Consider location/radiation/quality/duration/timing/severity/associated sxs/prior Treatment) HPI Comments: Kim [name] is a 47 y.o. Female presenting with acute on chronic low back pain which has which has been present for the past several weeks.   Patient denies any new injury specifically.  There is  radiation intoher left hip.  There has been no weakness or numbness in the lower extremities and no urinary or bowel retention or incontinence.  Patient does not have a history of cancer or IVDU.  She has taken oxy IR which is prescribed by her pcp in [place],  [place],  But has run out of this medicine.  She cannot take tylenol due to hepatitis c and cirrhosis.      The history is provided by the patient.    Past Medical History  Diagnosis Date  . Pacemaker     vent paced  . COPD (chronic obstructive pulmonary disease)   . CHF (congestive heart failure)   . Thyroid disease   . Hepatitis     Hep C  . MI (myocardial infarction)   . Pneumonia   . Dysrhythmia     atrial fibrilation  . Cirrhosis   . Shortness of breath   . Diabetes mellitus   . Chest pain   . GERD (gastroesophageal reflux disease)   . Headache(784.0)   . Anxiety   . A-fib   . Chronic pain   . PE (pulmonary embolism)    Past Surgical History  Procedure Laterality Date  . Cholecystectomy    . Pacemaker insertion    . Insert / replace / remove pacemaker      02/2012   Family History  Problem Relation Age of Onset  . Coronary artery disease Father 37  . Coronary artery disease Mother 44  . Coronary artery disease Brother    History  Substance Use Topics  . Smoking status: Current Every Day Smoker -- 0.50 packs/day for 30 years    Types: Cigarettes  . Smokeless tobacco: Current User    Types: Snuff  . Alcohol Use: No   OB History    Grav Para Term Preterm Abortions TAB SAB Ect Mult Living                 Review of Systems  Constitutional: Negative for fever.  Musculoskeletal: Positive for back pain and arthralgias. Negative for myalgias.  Neurological: Negative for weakness and numbness.    Allergies  Amoxil; Doxycycline; Flexeril; Nitroglycerin; Tramadol; Tylenol; and Vesicare  Home Medications   Current Outpatient Rx  Name  Route  Sig  Dispense  Refill  . albuterol (PROVENTIL HFA;VENTOLIN HFA) 108 (90 BASE) MCG/ACT inhaler   Inhalation   Inhale 2 puffs into the lungs every 6 (six) hours as needed for wheezing or shortness of breath.          Marland Kitchen atenolol (TENORMIN) 50 MG tablet   Oral   Take 100 mg by mouth daily.          . budesonide-formoterol (SYMBICORT) 80-4.5 MCG/ACT inhaler   Inhalation   Inhale 2 puffs into the lungs 2 (two) times daily.         . furosemide (LASIX) 80 MG tablet   Oral   Take 80 mg by mouth 2 (two) times daily.         Marland Kitchen  oxyCODONE (OXY IR/ROXICODONE) 5 MG immediate release tablet   Oral   Take 1 tablet (5 mg total) by mouth every 4 (four) hours as needed for pain.   15 tablet   0   . tiotropium (SPIRIVA) 18 MCG inhalation capsule   Inhalation   Place 18 mcg into inhaler and inhale daily.         Marland Kitchen warfarin (COUMADIN) 4 MG tablet   Oral   Take 4 mg by mouth every evening.          BP 156/92  Pulse 80  Temp(Src) 97.7 F (36.5 C) (Oral)  Resp 20  Wt 210 lb (95.255 kg)  BMI 33.91 kg/m2  SpO2 96%  LMP 02/08/2007 Physical Exam  Nursing note and vitals reviewed. Constitutional: She appears well-developed and well-nourished.  HENT:  Head: Normocephalic.  Eyes: Conjunctivae are normal.  Neck: Normal range of motion. Neck supple.  Cardiovascular: Normal rate and intact distal pulses.   Pedal pulses normal.  Pulmonary/Chest: Effort normal.  Abdominal: Soft. Bowel sounds are normal. She exhibits no distension and no mass.  Musculoskeletal: Normal  range of motion. She exhibits no edema.       Lumbar back: She exhibits tenderness. She exhibits no swelling, no edema and no spasm.  Neurological: She is alert. She has normal strength. She displays no atrophy and no tremor. No sensory deficit. Gait normal.  Reflex Scores:      Patellar reflexes are 2+ on the right side and 2+ on the left side.      Achilles reflexes are 2+ on the right side and 2+ on the left side. No strength deficit noted in hip and knee flexor and extensor muscle groups.  Ankle flexion and extension intact.  Skin: Skin is warm and dry.  Psychiatric: She has a normal mood and affect.    ED Course  Procedures (including critical care time) Labs Review Labs Reviewed - No data to display Imaging Review No results found.  MDM   1. Chronic back pain    Pt was given oxy IR 10 mg PO.  She was advised to f/u with pcp tomorrow for further management of her chronic pain.    No neuro deficit on exam or by history to suggest emergent or surgical presentation.  Also discussed worsened sx that should prompt immediate re-evaluation including distal weakness, bowel/bladder retention/incontinence.          Burgess Amor, PA-C 08/23/13 2028

## 2013-08-25 NOTE — ED Provider Notes (Signed)
Medical screening examination/treatment/procedure(s) were performed by non-physician practitioner and as supervising physician I was immediately available for consultation/collaboration.   Audree Camel, MD 08/25/13 [phone]

## 2013-10-06 ENCOUNTER — Emergency Department (HOSPITAL_COMMUNITY): Payer: Medicaid Other

## 2013-10-06 ENCOUNTER — Inpatient Hospital Stay (HOSPITAL_COMMUNITY)
Admission: EM | Admit: 2013-10-06 | Discharge: 2013-10-11 | DRG: 313 | Disposition: A | Discharge status: Home / Self Care | Payer: Medicaid Other | Attending: Family Medicine | Admitting: Family Medicine

## 2013-10-06 ENCOUNTER — Encounter (HOSPITAL_COMMUNITY): Payer: Self-pay | Admitting: Emergency Medicine

## 2013-10-06 DIAGNOSIS — E669 Obesity, unspecified: Secondary | ICD-10-CM | POA: Diagnosis present

## 2013-10-06 DIAGNOSIS — B1920 Unspecified viral hepatitis C without hepatic coma: Secondary | ICD-10-CM | POA: Diagnosis present

## 2013-10-06 DIAGNOSIS — F419 Anxiety disorder, unspecified: Secondary | ICD-10-CM

## 2013-10-06 DIAGNOSIS — I10 Essential (primary) hypertension: Secondary | ICD-10-CM | POA: Diagnosis present

## 2013-10-06 DIAGNOSIS — F319 Bipolar disorder, unspecified: Secondary | ICD-10-CM

## 2013-10-06 DIAGNOSIS — B182 Chronic viral hepatitis C: Secondary | ICD-10-CM

## 2013-10-06 DIAGNOSIS — R002 Palpitations: Secondary | ICD-10-CM

## 2013-10-06 DIAGNOSIS — F411 Generalized anxiety disorder: Secondary | ICD-10-CM

## 2013-10-06 DIAGNOSIS — R079 Chest pain, unspecified: Secondary | ICD-10-CM | POA: Diagnosis present

## 2013-10-06 DIAGNOSIS — E119 Type 2 diabetes mellitus without complications: Secondary | ICD-10-CM | POA: Diagnosis present

## 2013-10-06 DIAGNOSIS — G8929 Other chronic pain: Secondary | ICD-10-CM | POA: Diagnosis present

## 2013-10-06 DIAGNOSIS — Z5900 Homelessness unspecified: Secondary | ICD-10-CM

## 2013-10-06 DIAGNOSIS — L821 Other seborrheic keratosis: Secondary | ICD-10-CM | POA: Diagnosis present

## 2013-10-06 DIAGNOSIS — F17200 Nicotine dependence, unspecified, uncomplicated: Secondary | ICD-10-CM | POA: Diagnosis present

## 2013-10-06 DIAGNOSIS — K219 Gastro-esophageal reflux disease without esophagitis: Secondary | ICD-10-CM | POA: Diagnosis present

## 2013-10-06 DIAGNOSIS — I4891 Unspecified atrial fibrillation: Secondary | ICD-10-CM | POA: Diagnosis present

## 2013-10-06 DIAGNOSIS — J449 Chronic obstructive pulmonary disease, unspecified: Secondary | ICD-10-CM

## 2013-10-06 DIAGNOSIS — F341 Dysthymic disorder: Secondary | ICD-10-CM

## 2013-10-06 DIAGNOSIS — K5900 Constipation, unspecified: Secondary | ICD-10-CM | POA: Diagnosis present

## 2013-10-06 DIAGNOSIS — F329 Major depressive disorder, single episode, unspecified: Secondary | ICD-10-CM

## 2013-10-06 DIAGNOSIS — I5043 Acute on chronic combined systolic (congestive) and diastolic (congestive) heart failure: Secondary | ICD-10-CM

## 2013-10-06 DIAGNOSIS — F1010 Alcohol abuse, uncomplicated: Secondary | ICD-10-CM | POA: Diagnosis present

## 2013-10-06 DIAGNOSIS — R0789 Other chest pain: Principal | ICD-10-CM | POA: Diagnosis present

## 2013-10-06 DIAGNOSIS — F418 Other specified anxiety disorders: Secondary | ICD-10-CM | POA: Diagnosis present

## 2013-10-06 DIAGNOSIS — Z950 Presence of cardiac pacemaker: Secondary | ICD-10-CM | POA: Diagnosis present

## 2013-10-06 DIAGNOSIS — L989 Disorder of the skin and subcutaneous tissue, unspecified: Secondary | ICD-10-CM | POA: Diagnosis present

## 2013-10-06 DIAGNOSIS — Z7901 Long term (current) use of anticoagulants: Secondary | ICD-10-CM

## 2013-10-06 DIAGNOSIS — Z91199 Patient's noncompliance with other medical treatment and regimen due to unspecified reason: Secondary | ICD-10-CM

## 2013-10-06 DIAGNOSIS — J4489 Other specified chronic obstructive pulmonary disease: Secondary | ICD-10-CM | POA: Diagnosis present

## 2013-10-06 DIAGNOSIS — F1210 Cannabis abuse, uncomplicated: Secondary | ICD-10-CM | POA: Diagnosis present

## 2013-10-06 DIAGNOSIS — I059 Rheumatic mitral valve disease, unspecified: Secondary | ICD-10-CM | POA: Diagnosis present

## 2013-10-06 DIAGNOSIS — I509 Heart failure, unspecified: Secondary | ICD-10-CM | POA: Diagnosis present

## 2013-10-06 DIAGNOSIS — R45851 Suicidal ideations: Secondary | ICD-10-CM

## 2013-10-06 DIAGNOSIS — K7460 Unspecified cirrhosis of liver: Secondary | ICD-10-CM | POA: Diagnosis present

## 2013-10-06 DIAGNOSIS — I2510 Atherosclerotic heart disease of native coronary artery without angina pectoris: Secondary | ICD-10-CM | POA: Diagnosis present

## 2013-10-06 DIAGNOSIS — Z79899 Other long term (current) drug therapy: Secondary | ICD-10-CM

## 2013-10-06 DIAGNOSIS — Z86711 Personal history of pulmonary embolism: Secondary | ICD-10-CM

## 2013-10-06 DIAGNOSIS — Z720 Tobacco use: Secondary | ICD-10-CM | POA: Diagnosis present

## 2013-10-06 DIAGNOSIS — I4892 Unspecified atrial flutter: Secondary | ICD-10-CM | POA: Diagnosis present

## 2013-10-06 DIAGNOSIS — E876 Hypokalemia: Secondary | ICD-10-CM | POA: Diagnosis not present

## 2013-10-06 DIAGNOSIS — I5022 Chronic systolic (congestive) heart failure: Secondary | ICD-10-CM

## 2013-10-06 DIAGNOSIS — I252 Old myocardial infarction: Secondary | ICD-10-CM

## 2013-10-06 DIAGNOSIS — Z9119 Patient's noncompliance with other medical treatment and regimen: Secondary | ICD-10-CM

## 2013-10-06 DIAGNOSIS — Z23 Encounter for immunization: Secondary | ICD-10-CM

## 2013-10-06 LAB — CBC WITH DIFFERENTIAL/PLATELET
Eosinophils Absolute: 0.1 10*3/uL (ref 0.0–0.7)
Hemoglobin: 14.8 g/dL (ref 12.0–15.0)
Lymphocytes Relative: 35 % (ref 12–46)
Lymphs Abs: 2.9 10*3/uL (ref 0.7–4.0)
MCH: 34.4 pg — ABNORMAL HIGH (ref 26.0–34.0)
Monocytes Relative: 9 % (ref 3–12)
Neutro Abs: 4.5 10*3/uL (ref 1.7–7.7)
Neutrophils Relative %: 55 % (ref 43–77)
Platelets: 200 10*3/uL (ref 150–400)
RBC: 4.3 MIL/uL (ref 3.87–5.11)
WBC: 8.3 10*3/uL (ref 4.0–10.5)

## 2013-10-06 LAB — BASIC METABOLIC PANEL
BUN: 8 mg/dL (ref 6–23)
CO2: 27 mEq/L (ref 19–32)
Chloride: 99 mEq/L (ref 96–112)
GFR calc non Af Amer: >90 mL/min (ref >90)
Glucose, Bld: 112 mg/dL — ABNORMAL HIGH (ref 70–99)
Potassium: 3.4 mEq/L — ABNORMAL LOW (ref 3.5–5.1)
Sodium: 137 mEq/L (ref 135–145)

## 2013-10-06 MED ORDER — ONDANSETRON 4 MG PO TBDP
4.0000 mg | ORAL_TABLET | Freq: Once | ORAL | Status: AC
  Administered 2013-10-06: 4 mg via ORAL
  Filled 2013-10-06: qty 1

## 2013-10-06 MED ORDER — OXYCODONE HCL 5 MG PO TABS
10.0000 mg | ORAL_TABLET | Freq: Once | ORAL | Status: AC
  Administered 2013-10-06: 10 mg via ORAL
  Filled 2013-10-06: qty 2

## 2013-10-06 MED ORDER — DIPHENHYDRAMINE HCL 25 MG PO CAPS
25.0000 mg | ORAL_CAPSULE | Freq: Once | ORAL | Status: AC
  Administered 2013-10-06: 25 mg via ORAL
  Filled 2013-10-06: qty 1

## 2013-10-06 MED ORDER — ASPIRIN EC 325 MG PO TBEC
325.0000 mg | DELAYED_RELEASE_TABLET | Freq: Once | ORAL | Status: AC
  Administered 2013-10-06: 325 mg via ORAL
  Filled 2013-10-06: qty 1
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
## 2013-10-06 NOTE — ED Notes (Signed)
Dr.Schmitt states to give ASA, if itches, will give med for itches.

## 2013-10-06 NOTE — ED Provider Notes (Signed)
CSN: [id number]     Arrival date & time 10/06/13  1957 History   First MD Initiated Contact with Patient 10/06/13 1959     Chief Complaint  Patient presents with  . Palpitations   (Consider location/radiation/quality/duration/timing/severity/associated sxs/prior Treatment) The history is provided by the patient. No language interpreter was used.   Patient is a 47 year old Caucasian female with past medical history of COPD, CHF, and nonobstructive coronary artery disease who comes emergency department today with palpitations and left-sided chest pain for the past day. The chest pain is nonexertional she describes it as a sharp sensation. It is not pleuritic. She does have a history of PE and has not taken any of her medications for the past month because she's been unable to afford them.  She denies lower extremity edema. No shortness of breath. Rates the pain a 5/10.  She has a pacemaker for her congestive heart failure. She denies dyspnea, nausea, vomiting, abdominal pain. Today she was kicked out of her apartment because she was unable to pay for her bills. She was doing with the stress of finding a place to live started noticing the palpitations.  She does not follow up with her primary care physician. She contacted EMS who brought her to the emergency department.  When EMS arrived she had a pulse of 76 and was satting 97% on room air.  Past Medical History  Diagnosis Date  . Pacemaker     vent paced  . COPD (chronic obstructive pulmonary disease)   . CHF (congestive heart failure)   . Thyroid disease   . Hepatitis     Hep C  . MI (myocardial infarction)   . Pneumonia   . Dysrhythmia     atrial fibrilation  . Cirrhosis   . Shortness of breath   . Diabetes mellitus   . Chest pain   . GERD (gastroesophageal reflux disease)   . Headache(784.0)   . Anxiety   . A-fib   . Chronic pain   . PE (pulmonary embolism)    Past Surgical History  Procedure Laterality Date  .  Cholecystectomy    . Pacemaker insertion    . Insert / replace / remove pacemaker      02/2012   Family History  Problem Relation Age of Onset  . Coronary artery disease Father 70  . Coronary artery disease Mother 51  . Coronary artery disease Brother    History  Substance Use Topics  . Smoking status: Current Every Day Smoker -- 0.50 packs/day for 30 years    Types: Cigarettes  . Smokeless tobacco: Current User    Types: Snuff  . Alcohol Use: No   OB History   Grav Para Term Preterm Abortions TAB SAB Ect Mult Living                 Review of Systems  Constitutional: Negative for fever and chills.  Respiratory: Negative for cough and shortness of breath.   Cardiovascular: Positive for chest pain and palpitations.  Gastrointestinal: Negative for nausea, vomiting, diarrhea and constipation.  Genitourinary: Negative for dysuria, urgency and frequency.  All other systems reviewed and are negative.    Allergies  Nitroglycerin; Doxycycline; Flexeril; Ibuprofen; Tramadol; Tylenol; Vesicare; and Amoxil  Home Medications   Current Outpatient Rx  Name  Route  Sig  Dispense  Refill  . albuterol (PROVENTIL HFA;VENTOLIN HFA) 108 (90 BASE) MCG/ACT inhaler   Inhalation   Inhale 2 puffs into the lungs every 6 (six) hours  as needed for wheezing or shortness of breath.          Marland Kitchen atorvastatin (LIPITOR) 80 MG tablet   Oral   Take 80 mg by mouth at bedtime.         . budesonide-formoterol (SYMBICORT) 80-4.5 MCG/ACT inhaler   Inhalation   Inhale 2 puffs into the lungs 2 (two) times daily.         . carvedilol (COREG) 6.25 MG tablet   Oral   Take 6.25 mg by mouth 2 (two) times daily with a meal.         . furosemide (LASIX) 80 MG tablet   Oral   Take 80 mg by mouth 2 (two) times daily.         . potassium chloride (K-DUR,KLOR-CON) 10 MEQ tablet   Oral   Take 40 mEq by mouth daily.         Marland Kitchen esomeprazole (NEXIUM) 40 MG capsule   Oral   Take 40 mg by mouth daily  before breakfast.         . mirtazapine (REMERON) 15 MG tablet   Oral   Take 7.5 mg by mouth at bedtime. 1/2 tab nightly         . sertraline (ZOLOFT) 50 MG tablet   Oral   Take 50 mg by mouth daily.         Marland Kitchen tiotropium (SPIRIVA) 18 MCG inhalation capsule   Inhalation   Place 18 mcg into inhaler and inhale daily.         . traZODone (DESYREL) 50 MG tablet   Oral   Take 50 mg by mouth at bedtime.         Marland Kitchen warfarin (COUMADIN) 4 MG tablet   Oral   Take 4 mg by mouth every evening.          BP 118/69  Pulse 77  Temp(Src) 98.1 F (36.7 C) (Oral)  Resp 14  SpO2 97%  LMP 02/08/2007 Physical Exam  Nursing note and vitals reviewed. Constitutional: She is oriented to person, place, and time. She appears well-developed and well-nourished. No distress.  HENT:  Head: Normocephalic and atraumatic.  Eyes: Pupils are equal, round, and reactive to light.  Neck: Normal range of motion.  Cardiovascular: Normal rate, regular rhythm, normal heart sounds and intact distal pulses.   Pulmonary/Chest: Effort normal. No respiratory distress. She has no decreased breath sounds. She has no wheezes. She has no rhonchi. She has no rales. She exhibits no tenderness.  Abdominal: Soft. Bowel sounds are normal. She exhibits no distension. There is no tenderness. There is no rebound and no guarding.  Neurological: She is alert and oriented to person, place, and time. She has normal strength. No cranial nerve deficit or sensory deficit. She exhibits normal muscle tone. Coordination and gait normal.  Skin: Skin is warm and dry.    ED Course  Procedures (including critical care time) Labs Review Labs Reviewed  CBC WITH DIFFERENTIAL - Abnormal; Notable for the following:    MCH 34.4 (*)    MCHC 36.2 (*)    All other components within normal limits  BASIC METABOLIC PANEL - Abnormal; Notable for the following:    Potassium 3.4 (*)    Glucose, Bld 112 (*)    All other components within  normal limits  TROPONIN I - Abnormal; Notable for the following:    Troponin I 0.38 (*)    All other components within normal limits  URINE RAPID DRUG  SCREEN (HOSP PERFORMED)  ETHANOL  PROTIME-INR   Imaging Review Dg Chest 2 View  10/06/2013   CLINICAL DATA:  Palpitations  EXAM: CHEST  2 VIEW  COMPARISON:  07/21/2013  FINDINGS: Dual lead left-sided pacer in place. Mild cardiomegaly noted. No evidence for edema or focal pulmonary consolidation. No pleural effusion. No acute osseous abnormality.  IMPRESSION: Mild cardiomegaly persists without focal acute finding.   Electronically Signed   By: Christiana Pellant M.D.   On: 10/06/2013 21:13    EKG Interpretation   None       MDM  Patient is a 47 year old Caucasian female with past medical history of COPD and CHF who comes emergency department today with palpitations and chest pain. Physical exam as above. Initial workup included a CBC, BMP, troponin, EKG, and chest x-ray. EKG was unchanged from previous EKGs. As a result doubt STEMI. BMP was unremarkable. CBC was unremarkable. Troponin was elevated to 0.3. Review of records demonstrated that the patient had a cardiac catheterization a year ago which demonstrated nonobstructive coronary artery disease. At that time should wall motion abnormalities felt to be secondary to noncompliance with her medications. Be started on medications for this including bisoprostol which improved her cardiac function. With no exertional component to her chest pain and no shortness of breath this was felt to be unlikely to be an NSTEMI.  She was administered aspirin. With history of noncompliance, poor followup, off her medications for a month, and elevated troponin she was felt to require admission to the hospital for ACS rule out. With no pleuritic component to the chest pain, no lower extremity edema, and no tachycardia doubt a PE. Patient was admitted to family medicine service in stable condition labs and imaging  reviewed by myself and considered and medical decision-making. Imaging was interpreted by radiology. Care was discussed with my attending Dr. Romeo Apple.    1. Palpitations   2. Chest pain   3. Depression   4. Anxiety       Bethann Berkshire, MD 10/06/13 [phone]

## 2013-10-06 NOTE — ED Notes (Signed)
Per EMS, Patient has been having palpitations intermittently throughout the day. Patient has also been experiencing sharp pains in the left side of her chest today that are non-radiating. EMS reports that on their arrival patient was 76 and irregular on the monitor and 97% on room air. Patient normally wears home o2, but has not had it in a while. Patient states she has been out of her coumadin, anti-depressants, and HTN medications x1 month. Patient has hx of a-fib, v-tach, pacemaker insertion, DM, HTN, Hep C and depression.

## 2013-10-07 ENCOUNTER — Encounter (HOSPITAL_COMMUNITY): Payer: Self-pay | Admitting: Family Medicine

## 2013-10-07 DIAGNOSIS — E876 Hypokalemia: Secondary | ICD-10-CM

## 2013-10-07 DIAGNOSIS — I509 Heart failure, unspecified: Secondary | ICD-10-CM

## 2013-10-07 DIAGNOSIS — F329 Major depressive disorder, single episode, unspecified: Secondary | ICD-10-CM

## 2013-10-07 DIAGNOSIS — I5022 Chronic systolic (congestive) heart failure: Secondary | ICD-10-CM

## 2013-10-07 DIAGNOSIS — B182 Chronic viral hepatitis C: Secondary | ICD-10-CM

## 2013-10-07 DIAGNOSIS — R002 Palpitations: Secondary | ICD-10-CM

## 2013-10-07 DIAGNOSIS — K7460 Unspecified cirrhosis of liver: Secondary | ICD-10-CM

## 2013-10-07 DIAGNOSIS — I2510 Atherosclerotic heart disease of native coronary artery without angina pectoris: Secondary | ICD-10-CM

## 2013-10-07 DIAGNOSIS — I5043 Acute on chronic combined systolic (congestive) and diastolic (congestive) heart failure: Secondary | ICD-10-CM

## 2013-10-07 LAB — PROTIME-INR
INR: 1.13 (ref 0.00–1.49)
INR: 1.14 (ref 0.00–1.49)
Prothrombin Time: 14.4 seconds (ref 11.6–15.2)

## 2013-10-07 LAB — CBC
MCHC: 35.2 g/dL (ref 30.0–36.0)
Platelets: 193 10*3/uL (ref 150–400)
RBC: 4.45 MIL/uL (ref 3.87–5.11)
RDW: 13.1 % (ref 11.5–15.5)
WBC: 8.6 10*3/uL (ref 4.0–10.5)

## 2013-10-07 LAB — TROPONIN I
Troponin I: <0.3 ng/mL (ref <0.30)
Troponin I: <0.3 ng/mL (ref <0.30)

## 2013-10-07 LAB — BASIC METABOLIC PANEL
CO2: 29 mEq/L (ref 19–32)
Calcium: 8.9 mg/dL (ref 8.4–10.5)
Chloride: 96 mEq/L (ref 96–112)
Creatinine, Ser: 0.77 mg/dL (ref 0.50–1.10)
GFR calc Af Amer: >90 mL/min (ref >90)
GFR calc non Af Amer: >90 mL/min (ref >90)
Sodium: 136 mEq/L (ref 135–145)

## 2013-10-07 LAB — RAPID URINE DRUG SCREEN, HOSP PERFORMED
Barbiturates: NOT DETECTED
Benzodiazepines: NOT DETECTED
Cocaine: NOT DETECTED
Opiates: NOT DETECTED
Tetrahydrocannabinol: POSITIVE — AB

## 2013-10-07 LAB — HEPARIN LEVEL (UNFRACTIONATED)
Heparin Unfractionated: 0.25 IU/mL — ABNORMAL LOW (ref 0.30–0.70)
Heparin Unfractionated: 0.41 IU/mL (ref 0.30–0.70)
Heparin Unfractionated: 0.4 IU/mL (ref 0.30–0.70)

## 2013-10-07 MED ORDER — POTASSIUM CHLORIDE CRYS ER 20 MEQ PO TBCR
40.0000 meq | EXTENDED_RELEASE_TABLET | Freq: Every day | ORAL | Status: DC
  Administered 2013-10-07 – 2013-10-11 (×5): 40 meq via ORAL
  Filled 2013-10-07 (×6): qty 2

## 2013-10-07 MED ORDER — WARFARIN SODIUM 5 MG PO TABS
5.0000 mg | ORAL_TABLET | Freq: Once | ORAL | Status: AC
  Administered 2013-10-07: 5 mg via ORAL
  Filled 2013-10-07: qty 1

## 2013-10-07 MED ORDER — TIOTROPIUM BROMIDE MONOHYDRATE 18 MCG IN CAPS
18.0000 ug | ORAL_CAPSULE | Freq: Every day | RESPIRATORY_TRACT | Status: DC
  Administered 2013-10-08 – 2013-10-11 (×4): 18 ug via RESPIRATORY_TRACT
  Filled 2013-10-07 (×2): qty 5

## 2013-10-07 MED ORDER — OXYCODONE HCL 5 MG PO TABS
5.0000 mg | ORAL_TABLET | ORAL | Status: DC | PRN
  Administered 2013-10-07 – 2013-10-11 (×13): 5 mg via ORAL
  Filled 2013-10-07 (×14): qty 1

## 2013-10-07 MED ORDER — FOLIC ACID 1 MG PO TABS
1.0000 mg | ORAL_TABLET | Freq: Every day | ORAL | Status: DC
  Administered 2013-10-07 – 2013-10-11 (×5): 1 mg via ORAL
  Filled 2013-10-07 (×5): qty 1

## 2013-10-07 MED ORDER — ADULT MULTIVITAMIN W/MINERALS CH
1.0000 | ORAL_TABLET | Freq: Every day | ORAL | Status: DC
  Administered 2013-10-07 – 2013-10-11 (×5): 1 via ORAL
  Filled 2013-10-07 (×5): qty 1

## 2013-10-07 MED ORDER — WARFARIN - PHARMACIST DOSING INPATIENT: Freq: Every day | Status: DC

## 2013-10-07 MED ORDER — WARFARIN SODIUM 5 MG PO TABS: 5.0000 mg | ORAL_TABLET | Freq: Once | ORAL | Status: DC

## 2013-10-07 MED ORDER — THIAMINE HCL 100 MG/ML IJ SOLN
100.0000 mg | Freq: Every day | INTRAMUSCULAR | Status: DC
  Filled 2013-10-07 (×5): qty 1

## 2013-10-07 MED ORDER — WARFARIN SODIUM 5 MG PO TABS
5.0000 mg | ORAL_TABLET | ORAL | Status: AC
  Administered 2013-10-07: 03:00:00 5 mg via ORAL
  Filled 2013-10-07 (×2): qty 1

## 2013-10-07 MED ORDER — LORAZEPAM 1 MG PO TABS
1.0000 mg | ORAL_TABLET | Freq: Four times a day (QID) | ORAL | Status: AC | PRN
  Administered 2013-10-08: 1 mg via ORAL
  Filled 2013-10-07: qty 1

## 2013-10-07 MED ORDER — FUROSEMIDE 20 MG PO TABS
20.0000 mg | ORAL_TABLET | Freq: Two times a day (BID) | ORAL | Status: DC
  Administered 2013-10-07 – 2013-10-11 (×9): 20 mg via ORAL
  Filled 2013-10-07 (×11): qty 1

## 2013-10-07 MED ORDER — SODIUM CHLORIDE 0.9 % IJ SOLN
3.0000 mL | Freq: Two times a day (BID) | INTRAMUSCULAR | Status: DC
  Administered 2013-10-07 – 2013-10-11 (×8): 3 mL via INTRAVENOUS

## 2013-10-07 MED ORDER — HEPARIN (PORCINE) IN NACL 100-0.45 UNIT/ML-% IJ SOLN
1550.0000 [IU]/h | INTRAMUSCULAR | Status: DC
  Administered 2013-10-07: 02:00:00 1150 [IU]/h via INTRAVENOUS
  Administered 2013-10-07: 17:00:00 1350 [IU]/h via INTRAVENOUS
  Administered 2013-10-08: 12:00:00 1550 [IU]/h via INTRAVENOUS
  Filled 2013-10-07 (×5): qty 250

## 2013-10-07 MED ORDER — CARVEDILOL 3.125 MG PO TABS
3.1250 mg | ORAL_TABLET | Freq: Two times a day (BID) | ORAL | Status: DC
  Administered 2013-10-07 – 2013-10-11 (×9): 3.125 mg via ORAL
  Filled 2013-10-07 (×12): qty 1

## 2013-10-07 MED ORDER — BUDESONIDE-FORMOTEROL FUMARATE 80-4.5 MCG/ACT IN AERO
2.0000 | INHALATION_SPRAY | Freq: Two times a day (BID) | RESPIRATORY_TRACT | Status: DC
  Administered 2013-10-07 – 2013-10-11 (×7): 2 via RESPIRATORY_TRACT
  Filled 2013-10-07 (×2): qty 6.9

## 2013-10-07 MED ORDER — INFLUENZA VAC SPLIT QUAD 0.5 ML IM SUSP
0.5000 mL | INTRAMUSCULAR | Status: AC
  Administered 2013-10-08: 0.5 mL via INTRAMUSCULAR
  Filled 2013-10-07: qty 0.5

## 2013-10-07 MED ORDER — SERTRALINE HCL 50 MG PO TABS
50.0000 mg | ORAL_TABLET | Freq: Every day | ORAL | Status: DC
  Administered 2013-10-07 – 2013-10-11 (×5): 50 mg via ORAL
  Filled 2013-10-07 (×5): qty 1

## 2013-10-07 MED ORDER — SODIUM CHLORIDE 0.9 % IJ SOLN
3.0000 mL | Freq: Two times a day (BID) | INTRAMUSCULAR | Status: DC
  Administered 2013-10-07 – 2013-10-11 (×10): 3 mL via INTRAVENOUS

## 2013-10-07 MED ORDER — ONDANSETRON HCL 4 MG PO TABS: 4.0000 mg | ORAL_TABLET | Freq: Four times a day (QID) | ORAL | Status: DC | PRN

## 2013-10-07 MED ORDER — PANTOPRAZOLE SODIUM 40 MG PO TBEC
80.0000 mg | DELAYED_RELEASE_TABLET | Freq: Every day | ORAL | Status: DC
  Administered 2013-10-07 – 2013-10-11 (×5): 80 mg via ORAL
  Filled 2013-10-07 (×3): qty 2

## 2013-10-07 MED ORDER — LORAZEPAM 2 MG/ML IJ SOLN
1.0000 mg | Freq: Four times a day (QID) | INTRAMUSCULAR | Status: AC | PRN
  Administered 2013-10-07: 04:00:00 via INTRAVENOUS
  Filled 2013-10-07: qty 1

## 2013-10-07 MED ORDER — HEPARIN BOLUS VIA INFUSION
4000.0000 [IU] | Freq: Once | INTRAVENOUS | Status: AC
  Administered 2013-10-07: 4000 [IU] via INTRAVENOUS
  Filled 2013-10-07: qty 4000

## 2013-10-07 MED ORDER — MIRTAZAPINE 7.5 MG PO TABS
7.5000 mg | ORAL_TABLET | Freq: Every day | ORAL | Status: DC
  Administered 2013-10-07 – 2013-10-09 (×4): 7.5 mg via ORAL
  Filled 2013-10-07 (×5): qty 1

## 2013-10-07 MED ORDER — ALBUTEROL SULFATE HFA 108 (90 BASE) MCG/ACT IN AERS: 2.0000 | INHALATION_SPRAY | Freq: Four times a day (QID) | RESPIRATORY_TRACT | Status: DC | PRN

## 2013-10-07 MED ORDER — SODIUM CHLORIDE 0.9 % IJ SOLN
3.0000 mL | INTRAMUSCULAR | Status: DC | PRN
  Administered 2013-10-09: 3 mL via INTRAVENOUS

## 2013-10-07 MED ORDER — PNEUMOCOCCAL VAC POLYVALENT 25 MCG/0.5ML IJ INJ
0.5000 mL | INJECTION | INTRAMUSCULAR | Status: AC
  Administered 2013-10-08: 0.5 mL via INTRAMUSCULAR
  Filled 2013-10-07: qty 0.5

## 2013-10-07 MED ORDER — SODIUM CHLORIDE 0.9 % IV SOLN
250.0000 mL | INTRAVENOUS | Status: DC | PRN
  Administered 2013-10-07 (×2): 250 mL via INTRAVENOUS

## 2013-10-07 MED ORDER — POLYETHYLENE GLYCOL 3350 17 G PO PACK
17.0000 g | PACK | Freq: Every day | ORAL | Status: DC
  Administered 2013-10-07 – 2013-10-11 (×5): 17 g via ORAL
  Filled 2013-10-07 (×5): qty 1

## 2013-10-07 MED ORDER — TRAZODONE HCL 50 MG PO TABS
50.0000 mg | ORAL_TABLET | Freq: Every day | ORAL | Status: DC
  Administered 2013-10-07 – 2013-10-09 (×4): 50 mg via ORAL
  Filled 2013-10-07 (×5): qty 1

## 2013-10-07 MED ORDER — ONDANSETRON HCL 4 MG/2ML IJ SOLN: 4.0000 mg | Freq: Four times a day (QID) | INTRAMUSCULAR | Status: DC | PRN

## 2013-10-07 MED ORDER — SILVER SULFADIAZINE 1 % EX CREA
TOPICAL_CREAM | Freq: Every day | CUTANEOUS | Status: DC
  Administered 2013-10-07 – 2013-10-11 (×5): via TOPICAL
  Filled 2013-10-07: qty 85

## 2013-10-07 MED ORDER — ATORVASTATIN CALCIUM 80 MG PO TABS
80.0000 mg | ORAL_TABLET | Freq: Every day | ORAL | Status: DC
  Administered 2013-10-07 – 2013-10-10 (×5): 80 mg via ORAL
  Filled 2013-10-07 (×6): qty 1

## 2013-10-07 MED ORDER — VITAMIN B-1 100 MG PO TABS
100.0000 mg | ORAL_TABLET | Freq: Every day | ORAL | Status: DC
  Administered 2013-10-07 – 2013-10-11 (×5): 100 mg via ORAL
  Filled 2013-10-07 (×5): qty 1

## 2013-10-07 NOTE — ED Notes (Addendum)
Pt will be transported to the floor after urine drug screen resulted per admitting.

## 2013-10-07 NOTE — Progress Notes (Signed)
FMTS Attending Daily Note: Sara Neal MD [phone] pager office [phone] I  have seen and examined this patient, reviewed their chart. I have discussed this patient with the resident. I agree with the resident's findings, assessment and care plan. 

## 2013-10-07 NOTE — Consult Note (Signed)
Patient ID: Kim [name] MRN: [id number], DOB/AGE: 49/24/1967   Admit date: 10/06/2013 Date of Consult: @TODAY @  Primary Physician: Provider Not In System Primary Cardiologist: Marcy Panning    Problem List: Past Medical History  Diagnosis Date  . Pacemaker     vent paced  . COPD (chronic obstructive pulmonary disease)   . CHF (congestive heart failure)   . Thyroid disease   . Hepatitis     Hep C  . MI (myocardial infarction)   . Pneumonia   . Dysrhythmia     atrial fibrilation  . Cirrhosis   . Shortness of breath   . Diabetes mellitus   . Chest pain   . GERD (gastroesophageal reflux disease)   . Headache(784.0)   . Anxiety   . A-fib   . Chronic pain   . PE (pulmonary embolism)     Past Surgical History  Procedure Laterality Date  . Cholecystectomy    . Pacemaker insertion    . Insert / replace / remove pacemaker      02/2012     Allergies:  Allergies  Allergen Reactions  . Nitroglycerin Other (See Comments)    CAUSED NUMBNESS ALL OVER  . Doxycycline Itching  . Flexeril [Cyclobenzaprine] Other (See Comments)    Sweating, Lightheaded   . Ibuprofen Other (See Comments)    Increase BP, dizziness, lightheaded  . Tramadol Itching  . Tylenol [Acetaminophen] Other (See Comments)    Pt has Hep C  . Vesicare [Solifenacin] Other (See Comments)    Makes my sweat turn yellow  . 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  Has had a chronically elevated trop in past.   Presents with CP  Pain is pleuritic.  Comes goes.  Does go up with activity but occurs at rest  Has been occurring for awhile      Inpatient Medications:  . atorvastatin  80 mg Oral QHS  . budesonide-formoterol  2 puff Inhalation BID  . carvedilol  3.125 mg Oral BID WC  . folic acid  1 mg Oral Daily  . furosemide  20 mg Oral BID  . [START ON 10/08/2013] influenza vac split quadrivalent PF  0.5 mL Intramuscular Tomorrow-1000  . mirtazapine  7.5 mg Oral QHS  . multivitamin with minerals  1 tablet Oral Daily  . pantoprazole  80 mg Oral Q1200  . [START ON 10/08/2013] pneumococcal 23 valent vaccine  0.5 mL Intramuscular Tomorrow-1000  . polyethylene glycol  17 g Oral Daily  . potassium chloride  40 mEq Oral Daily  . sertraline  50 mg Oral Daily  . silver sulfADIAZINE   Topical Daily  . sodium chloride  3 mL Intravenous Q12H  . sodium chloride  3 mL Intravenous Q12H  . thiamine  100 mg Oral Daily   Or  . thiamine  100 mg Intravenous Daily  . tiotropium  18 mcg Inhalation Daily  . traZODone  50 mg Oral QHS  . warfarin  5 mg Oral ONCE-1800  . Warfarin - Pharmacist Dosing Inpatient   Does  not apply q1800    Family History  Problem Relation Age of Onset  . Coronary artery disease Father 61  . Coronary artery disease Mother 37  . Coronary artery disease Brother      History   Social History  . Marital Status: Single    Spouse Name: N/A    Number of Children: N/A  . Years of Education: N/A   Occupational History  . Not on file.   Social History Main Topics  . Smoking status: Current Every Day Smoker -- 0.50 packs/day for 30 years    Types: Cigarettes  . Smokeless tobacco: Current User    Types: Snuff  . Alcohol Use: No  . Drug Use: No  . Sexual Activity: No   Other Topics Concern  . Not on file   Social History Narrative   Contact information: [phone] (cell), [phone] (landline)     Review of Systems:  All other systems reviewed and are otherwise negative except as noted above.  Physical Exam: Filed Vitals:   10/07/13 0900  BP: 124/88  Pulse: 76  Temp: 97.5 F (36.4 C)   Resp: 20    Intake/Output Summary (Last 24 hours) at 10/07/13 1313 Last data filed at 10/07/13 0900  Gross per 24 hour  Intake      0 ml  Output      0 ml  Net      0 ml    General: Well developed, well nourished, in no acute distress. Head: Normocephalic, atraumatic, sclera non-icteric Neck: Negative for carotid bruits. JVP not elevated. Lungs: Clear bilaterally to auscultation without wheezes, rales, or rhonchi. Breathing is unlabored. Heart: RRR with S1 S2. No murmurs, rubs, or gallops appreciated. Chest  Tender to palpitation  Brings on symptoms of pain.   Abdomen: Soft, non-tender, non-distended with normoactive bowel sounds. No hepatomegaly. No rebound/guarding. No obvious abdominal masses.  Extremities: No clubbing, cyanosis or edema.  Distal pedal pulses are 2+ and equal bilaterally. Neuro: Alert and oriented X 3. Moves all extremities spontaneously. Psych:  Responds to questions appropriately with a normal affect.  Labs: Results for orders placed during the hospital encounter of 10/06/13 (from the past 24 hour(s))  CBC WITH DIFFERENTIAL     Status: Abnormal   Collection Time    10/06/13  8:30 PM      Result Value Range   WBC 8.3  4.0 - 10.5 K/uL   RBC 4.30  3.87 - 5.11 MIL/uL   Hemoglobin 14.8  12.0 - 15.0 g/dL   HCT 21.3  08.6 - 57.8 %   MCV 95.1  78.0 - 100.0 fL   MCH 34.4 (*) 26.0 - 34.0 pg   MCHC 36.2 (*) 30.0 - 36.0 g/dL   RDW 46.9  62.9 - 52.8 %   Platelets 200  150 - 400 K/uL   Neutrophils Relative % 55  43 - 77 %   Neutro Abs 4.5  1.7 - 7.7 K/uL   Lymphocytes Relative 35  12 - 46 %   Lymphs Abs 2.9  0.7 - 4.0 K/uL   Monocytes Relative 9  3 - 12 %   Monocytes Absolute 0.8  0.1 - 1.0 K/uL   Eosinophils Relative 1  0 - 5 %   Eosinophils Absolute 0.1  0.0 - 0.7 K/uL   Basophils Relative 0  0 - 1 %   Basophils Absolute 0.0  0.0 - 0.1 K/uL  BASIC METABOLIC PANEL     Status: Abnormal   Collection  Time    10/06/13  8:30 PM      Result Value Range    Sodium 137  135 - 145 mEq/L   Potassium 3.4 (*) 3.5 - 5.1 mEq/L   Chloride 99  96 - 112 mEq/L   CO2 27  19 - 32 mEq/L   Glucose, Bld 112 (*) 70 - 99 mg/dL   BUN 8  6 - 23 mg/dL   Creatinine, Ser 4.09  0.50 - 1.10 mg/dL   Calcium 8.9  8.4 - 81.1 mg/dL   GFR calc non Af Amer >90  >90 mL/min   GFR calc Af Amer >90  >90 mL/min  TROPONIN I     Status: Abnormal   Collection Time    10/06/13  8:31 PM      Result Value Range   Troponin I 0.38 (*) <0.30 ng/mL  ETHANOL     Status: None   Collection Time    10/06/13 11:25 PM      Result Value Range   Alcohol, Ethyl (B) <11  0 - 11 mg/dL  PROTIME-INR     Status: None   Collection Time    10/06/13 11:32 PM      Result Value Range   Prothrombin Time 14.3  11.6 - 15.2 seconds   INR 1.13  0.00 - 1.49  URINE RAPID DRUG SCREEN (HOSP PERFORMED)     Status: Abnormal   Collection Time    10/07/13 12:15 AM      Result Value Range   Opiates NONE DETECTED  NONE DETECTED   Cocaine NONE DETECTED  NONE DETECTED   Benzodiazepines NONE DETECTED  NONE DETECTED   Amphetamines NONE DETECTED  NONE DETECTED   Tetrahydrocannabinol POSITIVE (*) NONE DETECTED   Barbiturates NONE DETECTED  NONE DETECTED  TROPONIN I     Status: None   Collection Time    10/07/13  3:00 AM      Result Value Range   Troponin I <0.30  <0.30 ng/mL  BASIC METABOLIC PANEL     Status: Abnormal   Collection Time    10/07/13  3:00 AM      Result Value Range   Sodium 136  135 - 145 mEq/L   Potassium 3.2 (*) 3.5 - 5.1 mEq/L   Chloride 96  96 - 112 mEq/L   CO2 29  19 - 32 mEq/L   Glucose, Bld 93  70 - 99 mg/dL   BUN 9  6 - 23 mg/dL   Creatinine, Ser 9.14  0.50 - 1.10 mg/dL   Calcium 8.9  8.4 - 78.2 mg/dL   GFR calc non Af Amer >90  >90 mL/min   GFR calc Af Amer >90  >90 mL/min  CBC     Status: Abnormal   Collection Time    10/07/13  3:00 AM      Result Value Range   WBC 8.6  4.0 - 10.5 K/uL   RBC 4.45  3.87 - 5.11 MIL/uL   Hemoglobin 15.2 (*) 12.0 - 15.0 g/dL   HCT 95.6   21.3 - 08.6 %   MCV 97.1  78.0 - 100.0 fL   MCH 34.2 (*) 26.0 - 34.0 pg   MCHC 35.2  30.0 - 36.0 g/dL   RDW 57.8  46.9 - 62.9 %   Platelets 193  150 - 400 K/uL  PROTIME-INR     Status: None   Collection Time    10/07/13  3:00 AM  Result Value Range   Prothrombin Time 14.4  11.6 - 15.2 seconds   INR 1.14  0.00 - 1.49  TROPONIN I     Status: None   Collection Time    10/07/13  8:02 AM      Result Value Range   Troponin I <0.30  <0.30 ng/mL  HEPARIN LEVEL (UNFRACTIONATED)     Status: Abnormal   Collection Time    10/07/13  8:02 AM      Result Value Range   Heparin Unfractionated 0.25 (*) 0.30 - 0.70 IU/mL    Radiology/Studies: Dg Chest 2 View  10/06/2013   CLINICAL DATA:  Palpitations  EXAM: CHEST  2 VIEW  COMPARISON:  07/21/2013  FINDINGS: Dual lead left-sided pacer in place. Mild cardiomegaly noted. No evidence for edema or focal pulmonary consolidation. No pleural effusion. No acute osseous abnormality.  IMPRESSION: Mild cardiomegaly persists without focal acute finding.   Electronically Signed   By: Christiana Pellant M.D.   On: 10/06/2013 21:13    EKG:  None    ASSESSMENT AND PLAN:   1.  CP  Atypical  Chronic  Worse with pressing chest.  Initial trop is minimally increased  Others are neg. I am not convinced of active cardiac ischemia  I would keep on medical Rx  2.  CAD  Moderate by cath  Continue medical Rx  F/U in [facility].  3.  Chronic systolic CHF  Volume status looks OK Continue meds  4.  Afib  F/u with Roseanna Rainbow  Continue coumadin    Signed, Dietrich Pates 10/07/2013, 1:13 PM
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
## 2013-10-07 NOTE — Procedures (Signed)
Skin Punch Biopsy Procedure Note:  Verbal and written onsent obtained and time out performed. One cc of 2% lidocaine with epi injected in the skin of the left lateral scalp. Then a 4 ml punch biopsy used to remove a portion of the lesion with a border of normal appearing skin. The circle of skin was then undermined with scissors and removed with forceps. Pressure applied using gauze, followed by topical silver nitrate within the area of punch biopsy. Patient was instructed to continue applying pressure for 5-10 minutes after hemostasis obtained. Minimal bleeding, and the patient did well.   Signed consent form placed in patient's chart. The entire procedure was directly supervised by attending Dr. Jennette Kettle.  Levert Feinstein, MD [facility] Medicine PGY-2

## 2013-10-07 NOTE — Progress Notes (Signed)
Family Medicine Teaching Service Daily Progress Note Intern Pager: [phone]  Patient name: Kim [name] Medical record number: [id number] Date of birth: 07-Mar-1966 Age: 47 y.o. Gender: female  Primary Care Provider: Provider Not In System Consultants: none Code Status: full, need to clarify with patient though  Pt Overview and Major Events to Date:  10/31: admitted with multiple complaints including chest pain, suicidal initially then denied suicidality  Assessment and Plan:  Kim [name] is a 47 y.o. female presenting with multiple complaints including chest pain, palpitations, rash on head, and depression. PMH is significant for atrial fibrillation with pacemaker, CHF, COPD, h/o MI, h/o PE, depression/anxiety, DM, HTN, and hepatitis.   # Chest pain - Significant CV hx with previous MI in chart. Admitted with substernal-left chest pain radiating into left shoulder, but no clear inciting or relieving events. Due to risk factors (obesity, previous CV hx, DM, HTN), will rule out event. Symptoms and exam are not totally consistent. Vitals are stable. S/p asa 325mg  in ED. BMET/ CBC WNL, CXR stable. Cath 1 year ago - nonobstructive CAD and started bisoprolol which seemed to improve wall motion and around that time pt had neg troponin.  - await EKG this AM - Troponin 0.38 (appears to have baseline elevated troponin 0.3-0.4), continuing to cycle - daily aspirin, lipitor, prn oxygen - Tobacco cessation counseling  - Decreased coreg to 3.125mg  BID with meals (from 6.25BID), lasix from 80 to 20mg  BID.  - consult cardiology today given hx of CAD & positive troponin (although now has negative x 2) - on IV heparin while bridging coumadin  # Palpitations - Possibly related to her atrial fibrillation. Currently, no RVR. Some SOB at baseline with COPD. Off coumadin x 1 mo. Stable EKG. Vitals stable.  - INR subtherapeutic. Continue coumadin with heparin bridge - consult cardiology today, ?interrogate  pacer, consider repeat echo   # CHF - Does not currently appear fluid overloaded. Last echo 07/2012 - EF 35-40%, LV dilated cavity, mod LVH, mitral regurg, severe left atrial dilation, moderate right atrial dilation, septal apical and inferoapical akinesis.  - Restart lasix at lower dose given 1 month noncompliance and no overload symptoms (from 80mg  PO BID to 20mg  PO BID)  - Consider repeat echo in patient with poor follow up likely, pending discussion with cardiology   # Depression - Pt has not been taking her medications. Initially endorsed SI in ER, which resolved and pt denied SI upon hearing she would be admitted. Suspect depression is a contributor to her frequent visits to various hospitals with vague multiple complaints.  - Restart sertraline, trazodone, and mirtazapine  - CIWA protocol for hx of alcohol abuse - Consult SW and CM given multiple social stressors and a poor current housing situation.   # Scalp lesion - 2 on left side of scalp, per patient present 3 years, ulcerated. No signs of acute infection. Concerning for cancerous lesion given time course and irregularity on exam.  - Silver sulfadiazine topical  - Consider biopsy here vs outpatient   # Hypokalemia: K 3.2 this AM -replete PO  # HTN - Off meds x 1 mo  - Restarting but at lower doses (see above), BP's acceptable overnight  # COPD - Stable, satting well on room air. Reportedly on O2 PRN at home.  - Restarted home albuterol PRN, spiriva, and symbicort - O2 PRN   # THC use: UDS positive, encourage cessation  # GERD - Possible contribution to chest pain given hx and that  patient has come off medication.  - Restart PPI   # Hepatitis C - outpatient management  # Constipation - complains of difficulty with stools  - miralax daily, hold for loose stools   # Chronic back pain - Multiple allergies (tylenol, ibuprofen, tramadol) listed. Reports being on oxycodone 10mg  previously  - Oxycodone 5mg  Q4 prn  - Pt needs  PCP or pain clinic to manage outpatient.   FEN/GI: will go ahead and give diet now that has neg trop x2 Prophylaxis: On full dose anticoagulation  - Flu shot per pt request ordered  Disposition: pending cards consult, social work, ? inpatient biopsy of scalp lesion  Subjective: Sitter at bedside, patient endorses continuing to have some slight chest discomfort. Also endorses some shortness of breath, with certain positions. When asked if she is feeling suicidal, she says "not really." She is requesting to eat. States she feels weak from not having eaten yesterday or today.  Objective: Temp:  [97.3 F (36.3 C)-98.1 F (36.7 C)] 97.3 F (36.3 C) (11/01 0611) Pulse Rate:  [74-81] 81 (11/01 0611) Resp:  [12-22] 19 (11/01 0611) BP: (118-155)/(69-101) 139/93 mmHg (11/01 0611) SpO2:  [95 %-100 %] 100 % (11/01 0611) Weight:  [227 lb 6.4 oz (103.148 kg)] 227 lb 6.4 oz (103.148 kg) (11/01 0110) Physical Exam: General: No acute distress, sits up comfortably in bed Cardiovascular: Regular rate and rhythm, no murmurs Respiratory: Clear to auscultation bilaterally, normal respiratory effort Abdomen: Mildly tender to palpation, no masses appreciable Extremities: Calves nontender to palpation Psych: Displeased affect, denies active plans for SI at this time  Laboratory:  Recent Labs Lab 10/06/13 2030 10/07/13 0300  WBC 8.3 8.6  HGB 14.8 15.2*  HCT 40.9 43.2  PLT 200 193    Recent Labs Lab 10/06/13 2030 10/07/13 0300  NA 137 136  K 3.4* 3.2*  CL 99 96  CO2 27 29  BUN 8 9  CREATININE 0.66 0.77  CALCIUM 8.9 8.9  GLUCOSE 112* 93    Imaging/Diagnostic Tests: DG chest 2view: IMPRESSION:  Mild cardiomegaly persists without focal acute finding.  Trop initially 0.38, then neg x 2  Latrelle Dodrill, MD 10/07/2013, 8:57 AM PGY-2, [place] [facility] FPTS Intern pager: [phone], text pages welcome

## 2013-10-07 NOTE — Progress Notes (Addendum)
10/07/13  Pharmacy-  Heparin 1730  Heparin level = 0.41  A/P:  47yo female with AFib and hx of PE.  Heparin level therapeutic on current rate of 1350 units/hr.  No bleeding problems noted.  1.  Continue current rate 2.  Repeat HL in 6hr to verify therapeutic  Marisue Humble, PharmD Clinical Pharmacist [place] [facility]  10/07/13  2230  Heparin level 0.4 on 1350 units/hr, therapeutic x 2.  No problems noted.  Will continue current rate and f/u in AM.  Marisue Humble, PharmD Clinical Pharmacist [place] [facility]

## 2013-10-07 NOTE — H&P (Signed)
FMTS Attending Admission Note: Kanden Carey MD [phone] pager office [phone] I  have seen and examined this patient, reviewed their chart. I have discussed this patient with the resident. I agree with the resident's findings, assessment and care plan. 

## 2013-10-07 NOTE — Procedures (Signed)
    Kim [name]  

## 2013-10-07 NOTE — Progress Notes (Signed)
ANTICOAGULATION CONSULT NOTE - Initial Consult  Pharmacy Consult for Heparin/Warfarin Indication: atrial fibrillation  Allergies  Allergen Reactions  . Nitroglycerin Other (See Comments)    CAUSED NUMBNESS ALL OVER  . Doxycycline Itching  . Flexeril [Cyclobenzaprine] Other (See Comments)    Sweating, Lightheaded   . Ibuprofen Other (See Comments)    Increase BP, dizziness, lightheaded  . Tramadol Itching  . Tylenol [Acetaminophen] Other (See Comments)    Pt has Hep C  . Vesicare [Solifenacin] Other (See Comments)    Makes my sweat turn yellow  . Amoxil [Amoxicillin] Nausea Only    Patient Measurements:   Heparin Dosing Weight: ~80kg  Vital Signs: Temp: 98.1 F (36.7 C) (10/31 2006) Temp src: Oral (10/31 2006) BP: 135/94 mmHg (11/01 0000) Pulse Rate: 79 (11/01 0000)  Labs:  Recent Labs  10/06/13 2030 10/06/13 2031 10/06/13 2332  HGB 14.8  --   --   HCT 40.9  --   --   PLT 200  --   --   LABPROT  --   --  14.3  INR  --   --  1.13  CREATININE 0.66  --   --   TROPONINI  --  0.38*  --     The CrCl is unknown because both a height and weight (above a minimum accepted value) are required for this calculation.   Medical History: Past Medical History  Diagnosis Date  . Pacemaker     vent paced  . COPD (chronic obstructive pulmonary disease)   . CHF (congestive heart failure)   . Thyroid disease   . Hepatitis     Hep C  . MI (myocardial infarction)   . Pneumonia   . Dysrhythmia     atrial fibrilation  . Cirrhosis   . Shortness of breath   . Diabetes mellitus   . Chest pain   . GERD (gastroesophageal reflux disease)   . Headache(784.0)   . Anxiety   . A-fib   . Chronic pain   . PE (pulmonary embolism)     Assessment: 47 y/o F with hx of afib, noncompliant with warfarin therapy for at least one month, INR 1.13, to start heparin/warfarin per pharmacy. CBC good, renal function good, no overt bleeding noted.   Goal of Therapy:  INR 2-3 Heparin level  0.3-0.7 units/ml Monitor platelets by anticoagulation protocol: Yes   Plan:  -Heparin 4000 units BOLUS x 1 -Start heparin drip at 1150 units/hr -6 hour HL at 0730 -Daily CBC/HL -Warfarin 5mg  PO x 1 NOW -Daily PT/INR -Monitor for bleeding  Thank you for allowing me to take part in this patient's care,  Abran Duke, PharmD Clinical Pharmacist Phone: [phone] Pager: [phone] 10/07/2013 1:03 AM

## 2013-10-07 NOTE — ED Provider Notes (Signed)
Medical screening examination/treatment/procedure(s) were conducted as a shared visit with resident physician and myself.  I personally evaluated the patient during the encounter.   EKG Interpretation     Ventricular Rate:    PR Interval:    QRS Duration:   QT Interval:    QTC Calculation:   R Axis:     Text Interpretation:              I interviewed and examined the patient. Lungs are CTAB. Cardiac exam wnl. Abdomen soft.  Mildly elev trop, off her meds for 1 mos. Will admit to [facility].   Junius Argyle, MD 10/07/13 1110

## 2013-10-07 NOTE — Progress Notes (Signed)
ANTICOAGULATION CONSULT NOTE - Initial Consult  Pharmacy Consult for Heparin/Warfarin Indication: atrial fibrillation  Allergies  Allergen Reactions  . Nitroglycerin Other (See Comments)    CAUSED NUMBNESS ALL OVER  . Doxycycline Itching  . Flexeril [Cyclobenzaprine] Other (See Comments)    Sweating, Lightheaded   . Ibuprofen Other (See Comments)    Increase BP, dizziness, lightheaded  . Tramadol Itching  . Tylenol [Acetaminophen] Other (See Comments)    Pt has Hep C  . Vesicare [Solifenacin] Other (See Comments)    Makes my sweat turn yellow  . Amoxil [Amoxicillin] Nausea Only    Patient Measurements: Height: 5\' 9"  (175.3 cm) Weight: 227 lb 6.4 oz (103.148 kg) IBW/kg (Calculated) : 66.2 Heparin Dosing Weight: ~ 88 kg    Vital Signs: Temp: 97.3 F (36.3 C) (11/01 0611) Temp src: Oral (11/01 0611) BP: 139/93 mmHg (11/01 0611) Pulse Rate: 81 (11/01 0611)  Labs:  Recent Labs  10/06/13 2030 10/06/13 2031 10/06/13 2332 10/07/13 0300 10/07/13 0802  HGB 14.8  --   --  15.2*  --   HCT 40.9  --   --  43.2  --   PLT 200  --   --  193  --   LABPROT  --   --  14.3 14.4  --   INR  --   --  1.13 1.14  --   HEPARINUNFRC  --   --   --   --  0.25*  CREATININE 0.66  --   --  0.77  --   TROPONINI  --  0.38*  --  <0.30 <0.30    Estimated Creatinine Clearance: 110 ml/min (by C-G formula based on Cr of 0.77).   Medical History: Past Medical History  Diagnosis Date  . Pacemaker     vent paced  . COPD (chronic obstructive pulmonary disease)   . CHF (congestive heart failure)   . Thyroid disease   . Hepatitis     Hep C  . MI (myocardial infarction)   . Pneumonia   . Dysrhythmia     atrial fibrilation  . Cirrhosis   . Shortness of breath   . Diabetes mellitus   . Chest pain   . GERD (gastroesophageal reflux disease)   . Headache(784.0)   . Anxiety   . A-fib   . Chronic pain   . PE (pulmonary embolism)     Assessment: 47 y/o F with hx of afib, noncompliant  with warfarin therapy for at least one month, INR 1.13, to start heparin/warfarin per pharmacy. CBC good, renal function good, no overt bleeding noted.    Heparin 4,000 unit bolus x 1 given 11/01 at 0156 Coumadin 5mg  given  11/01 0236 Drip rate started at 1150 units/hr  11/01 0802 HL 0.25  Goal of Therapy:  INR 2-3 Heparin level 0.3-0.7 units/ml Monitor platelets by anticoagulation protocol: Yes   Plan:  -Increase heparin drip to 1350 units/hr -6 hour HL at 1400 -Daily CBC/HL - Has not taken coumadin in past month PTA, Coumadin 5mg  x 1 tonight -Daily PT/INR -Monitor for bleeding  Thank you for allowing me to take part in this patient's care,  Sparrow [place]acusa B. Artelia Laroche, PharmD Clinical Pharmacist - Resident Pager: [phone] Phone: [phone] 10/07/2013 9:32 AM

## 2013-10-08 DIAGNOSIS — J449 Chronic obstructive pulmonary disease, unspecified: Secondary | ICD-10-CM

## 2013-10-08 LAB — BASIC METABOLIC PANEL
BUN: 11 mg/dL (ref 6–23)
CO2: 28 mEq/L (ref 19–32)
GFR calc non Af Amer: 86 mL/min — ABNORMAL LOW (ref >90)
Glucose, Bld: 98 mg/dL (ref 70–99)
Potassium: 4 mEq/L (ref 3.5–5.1)
Sodium: 133 mEq/L — ABNORMAL LOW (ref 135–145)

## 2013-10-08 LAB — CBC
HCT: 38.6 % (ref 36.0–46.0)
Hemoglobin: 14 g/dL (ref 12.0–15.0)
MCH: 34.7 pg — ABNORMAL HIGH (ref 26.0–34.0)
MCV: 95.8 fL (ref 78.0–100.0)
Platelets: 189 10*3/uL (ref 150–400)
RBC: 4.03 MIL/uL (ref 3.87–5.11)
WBC: 7 10*3/uL (ref 4.0–10.5)

## 2013-10-08 MED ORDER — RIVAROXABAN 20 MG PO TABS
20.0000 mg | ORAL_TABLET | Freq: Every day | ORAL | Status: DC
  Administered 2013-10-08 – 2013-10-11 (×4): 20 mg via ORAL
  Filled 2013-10-08 (×4): qty 1

## 2013-10-08 MED ORDER — WARFARIN SODIUM 7.5 MG PO TABS
7.5000 mg | ORAL_TABLET | Freq: Once | ORAL | Status: DC
  Filled 2013-10-08: qty 1

## 2013-10-08 MED ORDER — DOCUSATE SODIUM 100 MG PO CAPS
100.0000 mg | ORAL_CAPSULE | Freq: Two times a day (BID) | ORAL | Status: DC
  Administered 2013-10-08 – 2013-10-11 (×6): 100 mg via ORAL
  Filled 2013-10-08 (×7): qty 1

## 2013-10-08 NOTE — Progress Notes (Signed)
FMTS Attending Daily Note: Denny Levy MD [phone] pager office [phone] I  have seen and examined this patient, reviewed their chart. I have discussed this patient with the resident. I agree with the resident's findings, assessment and care plan. Case management and social work involved. Patient would like to be evaluated for possible placement in a group home.

## 2013-10-08 NOTE — Progress Notes (Signed)
ANTICOAGULATION CONSULT NOTE - Initial Consult  Pharmacy Consult for Heparin/Warfarin Indication: atrial fibrillation  Allergies  Allergen Reactions  . Nitroglycerin Other (See Comments)    CAUSED NUMBNESS ALL OVER  . Doxycycline Itching  . Flexeril [Cyclobenzaprine] Other (See Comments)    Sweating, Lightheaded   . Ibuprofen Other (See Comments)    Increase BP, dizziness, lightheaded  . Tramadol Itching  . Tylenol [Acetaminophen] Other (See Comments)    Pt has Hep C  . Vesicare [Solifenacin] Other (See Comments)    Makes my sweat turn yellow  . Amoxil [Amoxicillin] Nausea Only    Patient Measurements: Height: 5\' 9"  (175.3 cm) Weight: 225 lb 5 oz (102.2 kg) (scale A) IBW/kg (Calculated) : 66.2 Heparin Dosing Weight: ~ 88 kg    Vital Signs: Temp: 98 F (36.7 C) (11/02 0505) Temp src: Oral (11/02 0505) BP: 137/81 mmHg (11/02 0505) Pulse Rate: 72 (11/02 0505)  Labs:  Recent Labs  10/06/13 2030 10/06/13 2031 10/06/13 2332 10/07/13 0300  10/07/13 0802 10/07/13 1535 10/07/13 2115 10/08/13 0544  HGB 14.8  --   --  15.2*  --   --   --   --   --   HCT 40.9  --   --  43.2  --   --   --   --   --   PLT 200  --   --  193  --   --   --   --   --   LABPROT  --   --  14.3 14.4  --   --   --   --  15.1  INR  --   --  1.13 1.14  --   --   --   --  1.22  HEPARINUNFRC  --   --   --   --   < > 0.25* 0.41 0.40 0.28*  CREATININE 0.66  --   --  0.77  --   --   --   --  0.80  TROPONINI  --  0.38*  --  <0.30  --  <0.30  --   --   --   < > = values in this interval not displayed.  Estimated Creatinine Clearance: 109.4 ml/min (by C-G formula based on Cr of 0.8).   Medical History: Past Medical History  Diagnosis Date  . Pacemaker     vent paced  . COPD (chronic obstructive pulmonary disease)   . CHF (congestive heart failure)   . Thyroid disease   . Hepatitis     Hep C  . MI (myocardial infarction)   . Pneumonia   . Dysrhythmia     atrial fibrilation  . Cirrhosis   .  Shortness of breath   . Diabetes mellitus   . Chest pain   . GERD (gastroesophageal reflux disease)   . Headache(784.0)   . Anxiety   . A-fib   . Chronic pain   . PE (pulmonary embolism)     Assessment: 47 y/o F with hx of afib, noncompliant with warfarin therapy for at least one month, INR 1.13>1.22 this AM. Heparin/warfarin per pharmacy. CBC good, renal function good, no overt bleeding noted.    Heparin 4,000 unit bolus x 1 given 11/01 at 0156 Coumadin 5mg  given  11/01 0236 Drip rate started at 1150 units/hr  1150 units/hr 11/01 0802 HL 0.25 1350 units/hr 11/01 > 0.41, 0.40 11/02 0544 HL 0.28 No bleeding, no leaking or issues with line per RN.  Goal of Therapy:  INR 2-3 Heparin level 0.3-0.7 units/ml Monitor platelets by anticoagulation protocol: Yes   Plan:  - HL has room to increase. Will increase heparin drip to 1550 units/hr -6 hour HL at 1430 - Daily CBC/HL - Has not taken coumadin in past month PTA; after 2 doses, INR now 1.22. Will give 7.5mg  x 1 tonight.  -Daily PT/INR -Monitor for bleeding  Thank you for allowing me to take part in this patient's care,  Berlie Hatchel B. Artelia Laroche, PharmD Clinical Pharmacist - Resident Pager: [phone] Phone: [phone] 10/08/2013 8:26 AM
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
## 2013-10-09 LAB — CBC
HCT: 34.4 % — ABNORMAL LOW (ref 36.0–46.0)
Hemoglobin: 12.1 g/dL (ref 12.0–15.0)
MCH: 34 pg (ref 26.0–34.0)
MCHC: 35.2 g/dL (ref 30.0–36.0)
MCV: 96.6 fL (ref 78.0–100.0)
RBC: 3.56 MIL/uL — ABNORMAL LOW (ref 3.87–5.11)

## 2013-10-09 NOTE — Progress Notes (Signed)
FMTS Attending Note Patient seen and examined by me, case discussed with resident team and I agree with A/P in this note, with following additions.  This afternoon Kim [name] remarks that she has been having worsening abdominal pain, and that she passed a bloody bowel movement after lunch.  She says she flushed the toilet before nursing could observe it.  She relates that she has worsening abdominal pain along the RLQ of her abdomen.  Her surgical history is significant for cholecystectomy.  Would check FOB and consider plain film KUB, repeat CBC to look for drop.  Consider further imaging if worsening abd pain.  If true Hgb drop, for GI consult.   Patient for SCDs for VTE prophylaxis (to hold heparin subcutaneous in setting of suspected GIB).  Kim Compton, MD
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
## 2013-10-09 NOTE — Progress Notes (Signed)
Patient Name: Kim [name] Date of Encounter: 10/09/2013     Principal Problem:   Chest pain Active Problems:   Skin lesion   Atrial fibrillation   Pacemaker   Depression with anxiety   Tobacco abuse   Palpitations    SUBJECTIVE  The patient still has pain on palpation of her left chest wall.  CURRENT MEDS . atorvastatin  80 mg Oral QHS  . budesonide-formoterol  2 puff Inhalation BID  . carvedilol  3.125 mg Oral BID WC  . docusate sodium  100 mg Oral BID  . folic acid  1 mg Oral Daily  . furosemide  20 mg Oral BID  . mirtazapine  7.5 mg Oral QHS  . multivitamin with minerals  1 tablet Oral Daily  . pantoprazole  80 mg Oral Q1200  . polyethylene glycol  17 g Oral Daily  . potassium chloride  40 mEq Oral Daily  . rivaroxaban  20 mg Oral Q supper  . sertraline  50 mg Oral Daily  . silver sulfADIAZINE   Topical Daily  . sodium chloride  3 mL Intravenous Q12H  . sodium chloride  3 mL Intravenous Q12H  . thiamine  100 mg Oral Daily   Or  . thiamine  100 mg Intravenous Daily  . tiotropium  18 mcg Inhalation Daily  . traZODone  50 mg Oral QHS    OBJECTIVE  Filed Vitals:   10/08/13 1330 10/08/13 2233 10/09/13 0628 10/09/13 0813  BP: 131/87 113/69 104/65   Pulse: 87 72 76   Temp: 98 F (36.7 C) 97.7 F (36.5 C) 98.1 F (36.7 C)   TempSrc: Oral Oral Oral   Resp: 20 18 18    Height:      Weight:   228 lb 2.8 oz (103.5 kg)   SpO2: 100% 100% 96% 98%    Intake/Output Summary (Last 24 hours) at 10/09/13 0825 Last data filed at 10/09/13 0810  Gross per 24 hour  Intake   1320 ml  Output   1400 ml  Net    -80 ml   Filed Weights   10/07/13 0110 10/08/13 0505 10/09/13 0628  Weight: 227 lb 6.4 oz (103.148 kg) 225 lb 5 oz (102.2 kg) 228 lb 2.8 oz (103.5 kg)    PHYSICAL EXAM  General: Pleasant, NAD. Neuro: Alert and oriented X 3. Moves all extremities spontaneously. Psych: Normal affect. HEENT:  Normal  Neck: Supple without bruits or JVD. Lungs:  Resp  regular and unlabored, CTA. Heart: RRR no s3, s4, or murmurs. Abdomen: Soft, non-tender, non-distended, BS + x 4.  Extremities: No clubbing, cyanosis or edema. DP/PT/Radials 2+ and equal bilaterally.  Accessory Clinical Findings  CBC  Recent Labs  10/06/13 2030  10/08/13 1400 10/09/13 0600  WBC 8.3  < > 7.0 PENDING  NEUTROABS 4.5  --   --   --   HGB 14.8  < > 14.0 12.1  HCT 40.9  < > 38.6 34.4*  MCV 95.1  < > 95.8 96.6  PLT 200  < > 189 202  < > = values in this interval not displayed. Basic Metabolic Panel  Recent Labs  10/07/13 0300 10/08/13 0544  NA 136 133*  K 3.2* 4.0  CL 96 96  CO2 29 28  GLUCOSE 93 98  BUN 9 11  CREATININE 0.77 0.80  CALCIUM 8.9 8.4   Cardiac Enzymes  Recent Labs  10/06/13 2031 10/07/13 0300 10/07/13 0802  TROPONINI 0.38* <0.30 <0.30   TELE  Ventricular pacing, HR 65-70 BPM  Radiology/Studies  Dg Chest 2 View  10/06/2013   CLINICAL DATA:  Palpitations  EXAM: CHEST  2 VIEW  COMPARISON:  07/21/2013  FINDINGS: Dual lead left-sided pacer in place. Mild cardiomegaly noted. No evidence for edema or focal pulmonary consolidation. No pleural effusion. No acute osseous abnormality.  IMPRESSION: Mild cardiomegaly persists without focal acute finding.   Electronically Signed   By: Christiana Pellant M.D.   On: 10/06/2013 21:13    ASSESSMENT AND PLAN:   1. CP Atypical Chronic - reproducible with pressing chest, most probably costochondritis. Initial trop is minimally increased Others are neg.  I am not convinced of active cardiac ischemia I would keep on medical Rx, NSAIDS for pain control 2. CAD Moderate by cath Continue medical Rx F/U in [facility].  3. Chronic systolic CHF Volume status looks OK Continue meds  4. Afib F/u with Roseanna Rainbow Continue coumadin   The patient could be discherged but needs a placement as she is currently homeless, awaiting for social work help. We will sign off, call us with any questions.   Signed, Lars Masson MD

## 2013-10-09 NOTE — Progress Notes (Signed)
Physical Therapy Treatment Patient Details Name: Kim [name] MRN: [id number] DOB: [birth year] Today's Date: 10/09/2013 Time: 4098-1191 PT Time Calculation (min): 16 min  PT Assessment / Plan / Recommendation  History of Present Illness Kim [name] is a 47 y.o. female presenting with multiple complaints including chest pain, palpitations, rash on head, and depression. PMH is significant for atrial fibrillation with pacemaker, CHF, COPD, h/o MI, h/o PE, depression/anxiety, DM, HTN, and hepatitis   PT Comments   Pt with improved mobility today able to ambulate in hall without assist. Pt stating she wants help to prevent falls and make her balance better but refuses to attempt Berg assessment, stairs or other challenges stating that just turning standing hurts her back. However pt will climb into and out of bed without difficulty and sit circle sit with back flexed without issue. Pt reports she is at baseline mobility and given lack of participation in further assessment feel pt at her prior level without further therapy needs. Pt perseverating on social situation and focused only on where she will live. Recommend daily hall ambulation with nursing. Signing off.  Follow Up Recommendations  No PT follow up     Does the patient have the potential to tolerate intense rehabilitation     Barriers to Discharge        Equipment Recommendations       Recommendations for Other Services    Frequency     Progress towards PT Goals Progress towards PT goals: Not progressing toward goals - comment (pt refusing to participate in any balance challenges or stairs)  Plan Discharge plan needs to be updated    Precautions / Restrictions Precautions Precautions: Fall   Pertinent Vitals/Pain Back pain grossly 4/10 VSS HR 75    Mobility  Bed Mobility Bed Mobility: Supine to Sit;Sit to Supine Supine to Sit: 7: Independent Sit to Supine: 7: Independent Transfers Sit to Stand: 6: Modified independent  (Device/Increase time);From bed Stand to Sit: 6: Modified independent (Device/Increase time);To bed Ambulation/Gait Ambulation/Gait Assistance: 6: Modified independent (Device/Increase time) Ambulation Distance (Feet): 500 Feet Assistive device: None Ambulation/Gait Assistance Details: pt with smooth steady gait with decreased speed, pt would not participate with speed or balance challenges Gait Pattern: Step-through pattern;Decreased stride length Gait velocity: decreased Stairs: No    Exercises     PT Diagnosis:    PT Problem List:   PT Treatment Interventions:     PT Goals (current goals can now be found in the care plan section)    Visit Information  Last PT Received On: 10/09/13 Assistance Needed: +1 History of Present Illness: Kim [name] is a 47 y.o. female presenting with multiple complaints including chest pain, palpitations, rash on head, and depression. PMH is significant for atrial fibrillation with pacemaker, CHF, COPD, h/o MI, h/o PE, depression/anxiety, DM, HTN, and hepatitis    Subjective Data      Cognition  Cognition Arousal/Alertness: Awake/alert Behavior During Therapy: WFL for tasks assessed/performed Overall Cognitive Status: Within Functional Limits for tasks assessed    Balance     End of Session PT - End of Session Equipment Utilized During Treatment: Gait belt Activity Tolerance: Patient tolerated treatment well Patient left: in bed;with call bell/phone within reach   GP     Delorse Lek 10/09/2013, 1:19 PM [name], PT [phone]
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
## 2013-10-09 NOTE — Progress Notes (Signed)
Family Medicine Teaching Service Daily Progress Note Intern Pager: [phone]  Patient name: Kim [name] Medical record number: [id number] Date of birth: [birth year] Age: 47 y.o. Gender: female  Primary Care Provider: Provider Not In System Consultants: cardiology Code Status: full  Pt Overview and Major Events to Date:  10/31: admitted with multiple complaints including chest pain, suicidal initially then denied suicidality 11/1: cards consult: pain is not cardiac; Punch Bx scalp lesion 11/3: Await SW/CM recommendations   Assessment and Plan: Kim [name] is a 47 y.o. female presenting with multiple complaints including chest pain, palpitations, rash on head, and depression. PMH is significant for atrial fibrillation with pacemaker, CHF, COPD, h/o MI, h/o PE, depression/anxiety, DM, HTN, hepatitis, and medication non-compliance.   The patient could be discherged but needs a placement as she is currently homeless, awaiting for social work help.  # Chest pain, chronic, atypical - ACS rule out.  - Initial troponin 0.38 on admit, followed by two negatives. ECG stable.  - Aspirin, lipitor, prn oxygen - Tobacco cessation counseling  - Decreased coreg to 3.125mg  BID with meals (from 6.25BID), lasix from 80 to 20mg  BID.  - Cardiology signed off  # Atrial fibrillation - Currently no RVR. Some SOB at baseline with COPD. Off coumadin x 1 mo. Stable ECG. Vitals stable.  - Xarelto started 11/2  # Systolic CHF - Last echo 07/2012 - EF 35-40%, LV dilated cavity, mod LVH, mitral regurg, severe left atrial dilation, moderate right atrial dilation, septal apical and inferoapical akinesis.  - Restart lasix at lower dose given 1 month noncompliance and no overload symptoms (from 80mg  PO BID to 20mg  PO BID)  - Outpatient f/u per cardiology in [facility]  # Depression - Suspect depression is a contributor to medical non-compliance and frequent visits to various hospitals with vague multiple  complaints.  - Restart sertraline, trazodone, and mirtazapine  - No SI - Consult SW and CM given multiple social stressors and a poor current housing situation - await any recs re: places patient could go after discharge  # Scalp lesion - 2 on left side of scalp, per patient present 3 years, ulcerated. No signs of acute infection.  - Silver sulfadiazine topical  - biopsy done 11/1, await path - wound care per RN  # Hypokalemia: resolved with repletion  # HTN - Off meds x 1 mo  - Restarting at lower doses (see above) - Normotensive overnight  # COPD - Stable, satting well on room air. Reportedly on O2 PRN at home.  - Restarted home albuterol PRN, spiriva, and symbicort - O2 PRN   # Marijuana use: UDS: + THC, encourage cessation  # GERD - Possible contribution to chest pain given hx and that patient has come off medication.  - Restarted PPI   # Hepatitis C - outpatient management  # Constipation - complains of difficulty with stools  - miralax daily, adding senna  # Chronic back pain - Multiple allergies (tylenol, ibuprofen, tramadol) listed. Reports being on oxycodone 10mg  previously  - Oxycodone 5mg  q4h prn  - Pt needs PCP or pain clinic to manage outpatient.  - PT/OT signed off due to patient's unwillingness to continue with evaluation.   FEN/GI: Carb modified, KVO Prophylaxis: Xarelto  Disposition: D/C pending social work help: rx drug assistance, living situation, substance abuse, list of PCPs  Subjective: Pt reports abdominal pain which is causing back pain, worse with movement. Trying to have BM but unable. Expresses a lot of anxiety over living  situation. Denies SI.   Objective: Temp:  [97.7 F (36.5 C)-98.1 F (36.7 C)] 98.1 F (36.7 C) (11/03 0628) Pulse Rate:  [72-87] 76 (11/03 0628) Resp:  [18-20] 18 (11/03 0628) BP: (104-131)/(65-87) 104/65 mmHg (11/03 0628) SpO2:  [93 %-100 %] 96 % (11/03 0628) Weight:  [228 lb 2.8 oz (103.5 kg)] 228 lb 2.8 oz (103.5 kg)  (11/03 1610) Physical Exam: General: Chronically ill-appearing 46yo female in no acute distress, sits up comfortably in bed Cardiovascular: irregularly irregular, no murmurs Respiratory: Clear to auscultation bilaterally, normal respiratory effort Abdomen: soft, NTTP Neuro: grossly nonfocal, speech normal Skin: area of L scalp healing s/p biopsy. No drainage  Laboratory:  Recent Labs Lab 10/07/13 0300 10/08/13 1400 10/09/13 0600  WBC 8.6 7.0 PENDING  HGB 15.2* 14.0 12.1  HCT 43.2 38.6 34.4*  PLT 193 189 202    Recent Labs Lab 10/06/13 2030 10/07/13 0300 10/08/13 0544  NA 137 136 133*  K 3.4* 3.2* 4.0  CL 99 96 96  CO2 27 29 28   BUN 8 9 11   CREATININE 0.66 0.77 0.80  CALCIUM 8.9 8.9 8.4  GLUCOSE 112* 93 98    Imaging/Diagnostic Tests: DG chest 2view: IMPRESSION:  Mild cardiomegaly persists without focal acute finding.  Trop initially 0.38, then neg x 2  Kim Junker, MD 10/09/2013, 7:20 AM PGY-1, [facility] Family Medicine FPTS Intern pager: [phone], text pages welcome

## 2013-10-09 NOTE — Progress Notes (Signed)
When chaplain entered the room, pt was awake and sitting up in bed.  Pt spoke of her stress.  Pt mentioned that social worker will be in touch with pt.  Chaplain offered emotional support through empathic listening.   Chaplain will follow up with pt.   10/09/13 1500  Clinical Encounter Type  Visited With Patient  Visit Type Psychological support;Spiritual support  Referral From Nurse    Rulon Abide
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
## 2013-10-10 LAB — CBC
MCH: 33.6 pg (ref 26.0–34.0)
MCHC: 34.3 g/dL (ref 30.0–36.0)
MCV: 97.9 fL (ref 78.0–100.0)
Platelets: 165 10*3/uL (ref 150–400)
RBC: 3.78 MIL/uL — ABNORMAL LOW (ref 3.87–5.11)
RDW: 13.5 % (ref 11.5–15.5)
WBC: 6 10*3/uL (ref 4.0–10.5)

## 2013-10-10 MED ORDER — TRAZODONE HCL 100 MG PO TABS
100.0000 mg | ORAL_TABLET | Freq: Every day | ORAL | Status: DC
  Administered 2013-10-10: 100 mg via ORAL
  Filled 2013-10-10 (×3): qty 1

## 2013-10-10 NOTE — Clinical Documentation Improvement (Signed)
THIS DOCUMENT IS NOT A PERMANENT PART OF THE MEDICAL RECORD  Please update your documentation with the medical record to reflect your response to this query. If you need help knowing how to do this please call [phone].           10/10/13  Dr. Jarvis Newcomer or Associates,  In an effort to better capture your patient's severity of illness, reflect appropriate length of stay and utilization of resources, a review of the patient medical record has revealed the following information:    - Patient admitted for Chest Pain   - "GERD" has been suggested as possible cause of the patient's chest pain per internal medicine teaching               notes   - "Costochondritis" has been documented as a probable cause of the patient's chest pain by cardiologist, Dr.           Delton See per progress note 10/09/13   Based on your clinical judgment, please document the Possible, Probable, Suspected or Known cause of the patient's Chest Pain:   - Chest Pain possibly due to GERD   - Chest Pain possibly due to Costochondritis   - Chest Pain possibly due to GERD vs Costochondritis   - Other Cause of Chest Pain   - Unable to Clinically Determine   Documentation of Possible, Probable, or Suspected diagnoses may be used with inpatient admissions  However, Possible, Probable, or Suspected diagnoses MUST be documented as such in the discharge summary, unless confirmed or ruled out during the admission.    In responding to this query please exercise your independent judgment.    The fact that a query is asked, does not imply that any particular answer is desired or expected.    Reviewed: "chronic atypical chest pain not d/t coronary ischemia, but likely anxiety and gerd" documented in pn and dc summary.  Mathis Dad RN  Thank You,  Jerral Ralph  RN BSN CCDS Certified Clinical Documentation Specialist: [phone] Health Information Management [place]

## 2013-10-10 NOTE — Progress Notes (Signed)
Family Medicine Teaching Service Daily Progress Note Intern Pager: [phone]  Patient name: Kim [name] Medical record number: [id number] Date of birth: 1966-09-16 Age: 47 y.o. Gender: female  Primary Care Provider: Provider Not In System Consultants: cardiology Code Status: full  Pt Overview and Major Events to Date:  10/31: admitted with multiple complaints including chest pain, suicidal initially then denied suicidality 11/1: cards consult: pain is not cardiac; Punch Bx scalp lesion 11/3: SW gave list of resources 11/4: Psychiatry consult for depression/suicidal ideation  Assessment and Plan: Kim [name] is a 47 y.o. female presenting with multiple complaints including chest pain, palpitations, rash on head, and depression. PMH is significant for atrial fibrillation with pacemaker, CHF, COPD, h/o MI, h/o PE, depression/anxiety, DM, HTN, hepatitis, and medication non-compliance.   # Depression - Suspect depression is a contributor to medical non-compliance and frequent visits to various hospitals with vague multiple complaints.  - Restart sertraline, trazodone, and mirtazapine  - Pt endorsing suicidal ideation intermittently with depressed affect. Feels "useless" and like no body cares about her. Denies plan. - Psychiatry consultation. - Social work signed off after providing resources for homelessness.   # Chest pain, chronic, atypical - Not due to coronary ischemia. Likely anxiety and GERD.  - Initial troponin 0.38 on admit, followed by two negatives. ECG stable.  - Aspirin, lipitor, prn oxygen - Tobacco cessation counseling  - Decreased coreg to 3.125mg  BID with meals (from 6.25BID), lasix from 80 to 20mg  BID.  - Cardiology signed off  # Atrial fibrillation - Currently no RVR. Some SOB at baseline with COPD. Off coumadin x 1 mo. Stable ECG. Vitals stable.  - Xarelto started 11/2  # Systolic CHF - Last echo 07/2012 - EF 35-40%, LV dilated cavity, mod LVH, mitral regurg,  severe left atrial dilation, moderate right atrial dilation, septal apical and inferoapical akinesis.  - Restart lasix at lower dose given 1 month noncompliance and no overload symptoms (from 80mg  PO BID to 20mg  PO BID)  - Outpatient f/u per cardiology in [facility]  # Scalp lesion - 2 on left side of scalp, per patient present 3 years, ulcerated. No signs of acute infection.  - Silver sulfadiazine topical  - biopsy done 11/1, await path - wound care per RN  # Hypokalemia: resolved with repletion  # HTN - Off meds x 1 mo  - Restarting at lower doses (see above) - Normotensive overnight  # COPD - Stable, satting well on room air. Reportedly on O2 PRN at home.  - Restarted home albuterol PRN, spiriva, and symbicort - O2 PRN   # Marijuana use: UDS: + THC, encourage cessation  # GERD - Contributor to chest pain  - Restarted PPI   # Hepatitis C - outpatient management  # Constipation - complains of difficulty with stools  - miralax daily, adding senna  # Chronic back pain - Multiple allergies (tylenol, ibuprofen, tramadol) listed. Reports being on oxycodone 10mg  previously  - Oxycodone 5mg  q4h prn  - Pt needs PCP or pain clinic to manage outpatient.  - PT/OT signed off due to patient's unwillingness to continue with evaluation.   FEN/GI: Carb modified, KVO Prophylaxis: Xarelto  Disposition: D/C pending social work help: rx drug assistance, living situation, substance abuse, list of PCPs  Subjective: Pt reports depressed mood and says she "might just kill herself" due to homelessness.   Objective: Temp:  [98.5 F (36.9 C)-98.6 F (37 C)] 98.5 F (36.9 C) (11/04 0515) Pulse Rate:  [76-88] 88 (  11/04 0700) Resp:  [16-18] 16 (11/04 0700) BP: (103-131)/(56-88) 128/75 mmHg (11/04 0700) SpO2:  [94 %-98 %] 94 % (11/04 0854) Weight:  [225 lb 14.4 oz (102.468 kg)] 225 lb 14.4 oz (102.468 kg) (11/04 0515) Physical Exam: General: Chronically ill-appearing 46yo female in no acute  distress, sits up comfortably in bed Cardiovascular: irregularly irregular, no murmurs Respiratory: Clear to auscultation bilaterally, normal respiratory effort Abdomen: soft, NTTP Neuro: grossly nonfocal, speech normal Skin: area of L scalp healing s/p biopsy. No drainage  Laboratory:  Recent Labs Lab 10/08/13 1400 10/09/13 0600 10/10/13 0602  WBC 7.0 7.3 6.0  HGB 14.0 12.1 12.7  HCT 38.6 34.4* 37.0  PLT 189 202 165    Recent Labs Lab 10/06/13 2030 10/07/13 0300 10/08/13 0544  NA 137 136 133*  K 3.4* 3.2* 4.0  CL 99 96 96  CO2 27 29 28   BUN 8 9 11   CREATININE 0.66 0.77 0.80  CALCIUM 8.9 8.9 8.4  GLUCOSE 112* 93 98    Imaging/Diagnostic Tests: DG chest 2view: IMPRESSION:  Mild cardiomegaly persists without focal acute finding.  Trop initially 0.38, then neg x 2  Kim Junker, MD 10/10/2013, 12:18 PM PGY-1, [facility] Family Medicine FPTS Intern pager: [phone], text pages welcome
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
## 2013-10-10 NOTE — Progress Notes (Signed)
FMTS Attending Note Patient seen and examined by me, discussed with resident team and I agree with assess/plan of Dr Karle Plumber.  Patient appears despondent with uncertainty of her living situation, is asking for help with this. Endorses abd pain but abd exam is soft and nontender.  No more reports of bloody diarrhea.  Plan for evaluation of reported suicidal ideation before discharge.  Appears medically stable for d/c once this is addressed. Paula Compton, MD

## 2013-10-10 NOTE — Progress Notes (Signed)
[address] . Carries conversation.with lapses of stopping of conversation  Abruptly . Denies  Hearting self  Or others . But feeling empty and useless . Emotional support provided

## 2013-10-11 LAB — CBC
HCT: 37.1 % (ref 36.0–46.0)
MCH: 33.6 pg (ref 26.0–34.0)
MCHC: 34 g/dL (ref 30.0–36.0)
Platelets: 187 10*3/uL (ref 150–400)
RDW: 13.7 % (ref 11.5–15.5)

## 2013-10-11 MED ORDER — RIVAROXABAN 20 MG PO TABS: 20.0000 mg | ORAL_TABLET | Freq: Once | ORAL | Status: DC

## 2013-10-11 MED ORDER — TUBERCULIN PPD 5 UNIT/0.1ML ID SOLN
5.0000 [IU] | Freq: Once | INTRADERMAL | Status: DC
  Administered 2013-10-11: 13:00:00 5 [IU] via INTRADERMAL
  Filled 2013-10-11: qty 0.1

## 2013-10-11 MED ORDER — SILVER SULFADIAZINE 1 % EX CREA: TOPICAL_CREAM | Freq: Every day | CUTANEOUS | Status: DC

## 2013-10-11 MED ORDER — TRAZODONE HCL 100 MG PO TABS: 100.0000 mg | ORAL_TABLET | Freq: Every day | ORAL | Status: DC

## 2013-10-11 MED ORDER — SILVER SULFADIAZINE 1 % EX CREA
TOPICAL_CREAM | Freq: Every day | CUTANEOUS | Status: DC
  Filled 2013-10-11: qty 85

## 2013-10-11 MED ORDER — RIVAROXABAN 20 MG PO TABS: 20.0000 mg | ORAL_TABLET | Freq: Every day | ORAL | Status: DC

## 2013-10-11 NOTE — Clinical Social Work Note (Addendum)
CSW has put FL2 for pt on her chart. CSW called RNCM to notify her that CSW has been communicating with group home [facility] (admissions rep Isaias Sakai [phone]). Kendal Hymen requested H&P and progress note for pt, CSW has faxed these to [facility] (fax # [phone]). Kendal Hymen also requested TB test for pt, RNCM made aware and has requested this.   Addendum: CSW received a call back from [facility], who says they are unable to meet pt's needs. CSW informed RNCM, who will cancel TB test. RNCM notified that pt's FL2 is on her chart and that she has resources to call additional group homes.  Maryclare Labrador, MSW, [facility] Clinical Social Worker [phone]

## 2013-10-11 NOTE — Progress Notes (Signed)
Family Medicine Teaching Service Daily Progress Note Intern Pager: [phone]  Patient name: Kim [name] Medical record number: [id number] Date of birth: 1966-07-04 Age: 47 y.o. Gender: female  Primary Care Provider: Provider Not In System Consultants: cardiology Code Status: full  Pt Overview and Major Events to Date:  10/31: admitted with multiple complaints including chest pain, suicidal initially then denied suicidality 11/1: cards consult: pain is not cardiac; Punch Bx scalp lesion 11/3: SW gave list of resources 11/4: Psychiatry consult for depression/suicidal ideation 11/5: Medically cleared for discharge  Assessment and Plan: Kim [name] is a 47 y.o. female presenting with multiple complaints including chest pain, palpitations, rash on head, and depression. PMH is significant for atrial fibrillation with pacemaker, CHF, COPD, h/o MI, h/o PE, depression/anxiety, DM, HTN, hepatitis, and medication non-compliance.   # Depression - Suspect depression is a contributor to medical non-compliance and frequent visits to various hospitals with vague multiple complaints.  - Restart sertraline, trazodone, and mirtazapine  - Pt sad today about living situation. She continues to be told she can not get into a shelter. - Psychiatry consultation appreciated, medications changed as directed.  - Social work involved.  # Chest pain, chronic, atypical - Not due to coronary ischemia. Likely anxiety and GERD.  - Initial troponin 0.38 on admit, followed by two negatives. ECG stable.  - Aspirin, lipitor, prn oxygen - Tobacco cessation counseling  - Decreased coreg to 3.125mg  BID with meals (from 6.25BID), lasix from 80 to 20mg  BID.  - Cardiology signed off  # Atrial fibrillation - Currently no RVR. Some SOB at baseline with COPD. Off coumadin x 1 mo. Stable ECG. Vitals stable.  - Xarelto started 11/2  # Systolic CHF - Last echo 07/2012 - EF 35-40%, LV dilated cavity, mod LVH, mitral regurg,  severe left atrial dilation, moderate right atrial dilation, septal apical and inferoapical akinesis.  - Restart lasix at lower dose given 1 month noncompliance and no overload symptoms (from 80mg  PO BID to 20mg  PO BID)  - Outpatient f/u per cardiology in [facility]  # Scalp lesion - 2 on left side of scalp, per patient present 3 years, ulcerated. No signs of acute infection.  - Silver sulfadiazine topical  - biopsy done 11/1, await path - wound care per RN  # Hypokalemia: resolved with repletion  # HTN - Off meds x 1 mo  - Restarting at lower doses (see above) - Normotensive overnight  # COPD - Stable, satting well on room air. Reportedly on O2 PRN at home.  - Restarted home albuterol PRN, spiriva, and symbicort - O2 PRN   # Marijuana use: UDS: + THC, encourage cessation  # GERD - Contributor to chest pain  - Restarted PPI   # Hepatitis C - outpatient management  # Constipation - complains of difficulty with stools  - miralax daily, adding senna  # Chronic back pain - Multiple allergies (tylenol, ibuprofen, tramadol) listed. Reports being on oxycodone 10mg  previously  - Oxycodone 5mg  q4h prn  - Pt needs PCP or pain clinic to manage outpatient.  - PT/OT signed off due to patient's unwillingness to continue with evaluation.   FEN/GI: Carb modified, KVO Prophylaxis: Xarelto  Disposition: D/C pending social work help: rx drug assistance, living situation, substance abuse, list of PCPs  Subjective: Pt reports depressed mood.   Objective: Temp:  [97.2 F (36.2 C)-98.6 F (37 C)] 97.2 F (36.2 C) (11/05 0532) Pulse Rate:  [74-80] 74 (11/05 0813) Resp:  [16-18] 18 (11/05  0532) BP: (107-138)/(61-82) 119/82 mmHg (11/05 0813) SpO2:  [98 %-100 %] 99 % (11/05 0813) Weight:  [224 lb 6.9 oz (101.8 kg)] 224 lb 6.9 oz (101.8 kg) (11/05 0532) Physical Exam: General: Chronically ill-appearing 46yo female in no acute distress, sits up comfortably in bed Cardiovascular:  irregularly irregular, no murmurs Respiratory: Clear to auscultation bilaterally, normal respiratory effort Abdomen: soft, NTTP Neuro: grossly nonfocal, speech normal Skin: area of L scalp healing s/p biopsy. No drainage  Laboratory:  Recent Labs Lab 10/09/13 0600 10/10/13 0602 10/11/13 0447  WBC 7.3 6.0 7.4  HGB 12.1 12.7 12.6  HCT 34.4* 37.0 37.1  PLT 202 165 187    Recent Labs Lab 10/06/13 2030 10/07/13 0300 10/08/13 0544  NA 137 136 133*  K 3.4* 3.2* 4.0  CL 99 96 96  CO2 27 29 28   BUN 8 9 11   CREATININE 0.66 0.77 0.80  CALCIUM 8.9 8.9 8.4  GLUCOSE 112* 93 98    Imaging/Diagnostic Tests: DG chest 2view: IMPRESSION:  Mild cardiomegaly persists without focal acute finding.  Trop initially 0.38, then neg x 2  Hazeline Junker, MD 10/11/2013, 1:58 PM PGY-1, [facility] Family Medicine FPTS Intern pager: [phone], text pages welcome

## 2013-10-11 NOTE — Progress Notes (Signed)
FMTS Attending Note Patient's care discussed with resident team, I agree with Dr Jarvis Newcomer' assessment and plan for discharge. Scalp biopsy to be followed up by her primary physician at hospital followup.  Paula Compton, MD
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
## 2013-10-11 NOTE — Clinical Social Work Note (Signed)
Pt has been provided with resources for shelters, organizations that provide affordable housing and meals, and lists of group homes. CSW filled out an FL2 for pt. CSW signing off, as there are no other services CSW can provide for pt.   Maryclare Labrador, MSW, [facility] Clinical Social Worker [phone]

## 2013-10-11 NOTE — Progress Notes (Signed)
Chaplain visited pt as a follow up to previous visits.  Chaplain delivered bible to pt.  Pt told chaplain that she hopes to go to a group home after discharge from the hospital.     10/11/13 1200  Clinical Encounter Type  Visited With Patient  Visit Type Follow-up;Spiritual support  Spiritual Encounters  Spiritual Needs Emotional  Stress Factors  Patient Stress Factors Other (Comment);Family relationships (Pt has no residence after discharge from [facility])    Rulon Abide

## 2013-10-11 NOTE — Progress Notes (Signed)
DC IV, DC Tele, DC patient from hospitalization. Discharge instructions and medications discussed with patient. Outpatient resources and who to notify for certain circumstances discussed with patient in detail. Social worker, Sports coach, Psych RN, Consulting civil engineer all present during the review of discharge information in case clarification was needed.  Chaplain called prior to discharge per patient request. Patient denied any questions or concerns at this time. Patient leaving unit via wheelchair and appears in no acute distress.

## 2013-11-08 ENCOUNTER — Encounter: Payer: Self-pay | Admitting: Family Medicine

## 2013-12-14 ENCOUNTER — Encounter (HOSPITAL_COMMUNITY): Payer: Self-pay | Admitting: Emergency Medicine

## 2013-12-14 ENCOUNTER — Emergency Department (HOSPITAL_COMMUNITY): Payer: Medicaid Other

## 2013-12-14 ENCOUNTER — Inpatient Hospital Stay (HOSPITAL_COMMUNITY)
Admission: EM | Admit: 2013-12-14 | Discharge: 2013-12-17 | DRG: 291 | Disposition: A | Discharge status: Home / Self Care | Payer: Medicaid Other | Attending: Internal Medicine | Admitting: Internal Medicine

## 2013-12-14 DIAGNOSIS — R778 Other specified abnormalities of plasma proteins: Secondary | ICD-10-CM | POA: Diagnosis present

## 2013-12-14 DIAGNOSIS — F411 Generalized anxiety disorder: Secondary | ICD-10-CM | POA: Diagnosis present

## 2013-12-14 DIAGNOSIS — I5022 Chronic systolic (congestive) heart failure: Secondary | ICD-10-CM

## 2013-12-14 DIAGNOSIS — Z888 Allergy status to other drugs, medicaments and biological substances status: Secondary | ICD-10-CM

## 2013-12-14 DIAGNOSIS — J441 Chronic obstructive pulmonary disease with (acute) exacerbation: Secondary | ICD-10-CM | POA: Diagnosis present

## 2013-12-14 DIAGNOSIS — E876 Hypokalemia: Secondary | ICD-10-CM

## 2013-12-14 DIAGNOSIS — Z91199 Patient's noncompliance with other medical treatment and regimen due to unspecified reason: Secondary | ICD-10-CM

## 2013-12-14 DIAGNOSIS — K7460 Unspecified cirrhosis of liver: Secondary | ICD-10-CM | POA: Diagnosis present

## 2013-12-14 DIAGNOSIS — Z720 Tobacco use: Secondary | ICD-10-CM

## 2013-12-14 DIAGNOSIS — D7589 Other specified diseases of blood and blood-forming organs: Secondary | ICD-10-CM | POA: Diagnosis present

## 2013-12-14 DIAGNOSIS — F17200 Nicotine dependence, unspecified, uncomplicated: Secondary | ICD-10-CM | POA: Diagnosis present

## 2013-12-14 DIAGNOSIS — I2489 Other forms of acute ischemic heart disease: Secondary | ICD-10-CM | POA: Diagnosis present

## 2013-12-14 DIAGNOSIS — I10 Essential (primary) hypertension: Secondary | ICD-10-CM | POA: Diagnosis present

## 2013-12-14 DIAGNOSIS — R197 Diarrhea, unspecified: Secondary | ICD-10-CM

## 2013-12-14 DIAGNOSIS — Z22322 Carrier or suspected carrier of Methicillin resistant Staphylococcus aureus: Secondary | ICD-10-CM

## 2013-12-14 DIAGNOSIS — Z86711 Personal history of pulmonary embolism: Secondary | ICD-10-CM

## 2013-12-14 DIAGNOSIS — I509 Heart failure, unspecified: Secondary | ICD-10-CM | POA: Diagnosis present

## 2013-12-14 DIAGNOSIS — Z8249 Family history of ischemic heart disease and other diseases of the circulatory system: Secondary | ICD-10-CM

## 2013-12-14 DIAGNOSIS — R002 Palpitations: Secondary | ICD-10-CM

## 2013-12-14 DIAGNOSIS — F341 Dysthymic disorder: Secondary | ICD-10-CM | POA: Diagnosis present

## 2013-12-14 DIAGNOSIS — B1920 Unspecified viral hepatitis C without hepatic coma: Secondary | ICD-10-CM | POA: Diagnosis present

## 2013-12-14 DIAGNOSIS — F418 Other specified anxiety disorders: Secondary | ICD-10-CM

## 2013-12-14 DIAGNOSIS — M545 Low back pain, unspecified: Secondary | ICD-10-CM

## 2013-12-14 DIAGNOSIS — R7309 Other abnormal glucose: Secondary | ICD-10-CM | POA: Diagnosis not present

## 2013-12-14 DIAGNOSIS — Z9119 Patient's noncompliance with other medical treatment and regimen: Secondary | ICD-10-CM

## 2013-12-14 DIAGNOSIS — I2510 Atherosclerotic heart disease of native coronary artery without angina pectoris: Secondary | ICD-10-CM

## 2013-12-14 DIAGNOSIS — I4892 Unspecified atrial flutter: Secondary | ICD-10-CM | POA: Diagnosis present

## 2013-12-14 DIAGNOSIS — B182 Chronic viral hepatitis C: Secondary | ICD-10-CM

## 2013-12-14 DIAGNOSIS — R059 Cough, unspecified: Secondary | ICD-10-CM

## 2013-12-14 DIAGNOSIS — Z79899 Other long term (current) drug therapy: Secondary | ICD-10-CM

## 2013-12-14 DIAGNOSIS — I214 Non-ST elevation (NSTEMI) myocardial infarction: Secondary | ICD-10-CM

## 2013-12-14 DIAGNOSIS — I5043 Acute on chronic combined systolic (congestive) and diastolic (congestive) heart failure: Principal | ICD-10-CM

## 2013-12-14 DIAGNOSIS — R079 Chest pain, unspecified: Secondary | ICD-10-CM

## 2013-12-14 DIAGNOSIS — Z7901 Long term (current) use of anticoagulants: Secondary | ICD-10-CM

## 2013-12-14 DIAGNOSIS — J449 Chronic obstructive pulmonary disease, unspecified: Secondary | ICD-10-CM

## 2013-12-14 DIAGNOSIS — R7989 Other specified abnormal findings of blood chemistry: Secondary | ICD-10-CM | POA: Diagnosis present

## 2013-12-14 DIAGNOSIS — J9600 Acute respiratory failure, unspecified whether with hypoxia or hypercapnia: Secondary | ICD-10-CM | POA: Diagnosis present

## 2013-12-14 DIAGNOSIS — I252 Old myocardial infarction: Secondary | ICD-10-CM

## 2013-12-14 DIAGNOSIS — L989 Disorder of the skin and subcutaneous tissue, unspecified: Secondary | ICD-10-CM

## 2013-12-14 DIAGNOSIS — J90 Pleural effusion, not elsewhere classified: Secondary | ICD-10-CM

## 2013-12-14 DIAGNOSIS — R55 Syncope and collapse: Secondary | ICD-10-CM

## 2013-12-14 DIAGNOSIS — R0602 Shortness of breath: Secondary | ICD-10-CM

## 2013-12-14 DIAGNOSIS — T380X5A Adverse effect of glucocorticoids and synthetic analogues, initial encounter: Secondary | ICD-10-CM | POA: Diagnosis not present

## 2013-12-14 DIAGNOSIS — G8929 Other chronic pain: Secondary | ICD-10-CM | POA: Diagnosis present

## 2013-12-14 DIAGNOSIS — I4891 Unspecified atrial fibrillation: Secondary | ICD-10-CM

## 2013-12-14 DIAGNOSIS — I248 Other forms of acute ischemic heart disease: Secondary | ICD-10-CM | POA: Diagnosis present

## 2013-12-14 DIAGNOSIS — K219 Gastro-esophageal reflux disease without esophagitis: Secondary | ICD-10-CM | POA: Diagnosis present

## 2013-12-14 DIAGNOSIS — J189 Pneumonia, unspecified organism: Secondary | ICD-10-CM

## 2013-12-14 DIAGNOSIS — R05 Cough: Secondary | ICD-10-CM

## 2013-12-14 DIAGNOSIS — Z950 Presence of cardiac pacemaker: Secondary | ICD-10-CM

## 2013-12-14 HISTORY — DX: Unspecified viral hepatitis C without hepatic coma: B19.20

## 2013-12-14 LAB — URINALYSIS, ROUTINE W REFLEX MICROSCOPIC
Glucose, UA: NEGATIVE mg/dL
Hgb urine dipstick: NEGATIVE
KETONES UR: NEGATIVE mg/dL
Leukocytes, UA: NEGATIVE
NITRITE: NEGATIVE
PH: 5.5 (ref 5.0–8.0)
PROTEIN: NEGATIVE mg/dL
Specific Gravity, Urine: >1.03 — ABNORMAL HIGH (ref 1.005–1.030)
Urobilinogen, UA: 1 mg/dL (ref 0.0–1.0)

## 2013-12-14 LAB — PRO B NATRIURETIC PEPTIDE: Pro B Natriuretic peptide (BNP): 1144 pg/mL — ABNORMAL HIGH (ref 0–125)

## 2013-12-14 LAB — CBC WITH DIFFERENTIAL/PLATELET
BASOS PCT: 0 % (ref 0–1)
Basophils Absolute: 0 10*3/uL (ref 0.0–0.1)
EOS ABS: 0.1 10*3/uL (ref 0.0–0.7)
Eosinophils Relative: 1 % (ref 0–5)
HCT: 44.2 % (ref 36.0–46.0)
HEMOGLOBIN: 14.9 g/dL (ref 12.0–15.0)
Lymphocytes Relative: 23 % (ref 12–46)
Lymphs Abs: 2.4 10*3/uL (ref 0.7–4.0)
MCH: 34.6 pg — AB (ref 26.0–34.0)
MCHC: 33.7 g/dL (ref 30.0–36.0)
MCV: 102.6 fL — ABNORMAL HIGH (ref 78.0–100.0)
MONOS PCT: 12 % (ref 3–12)
Monocytes Absolute: 1.2 10*3/uL — ABNORMAL HIGH (ref 0.1–1.0)
NEUTROS ABS: 6.5 10*3/uL (ref 1.7–7.7)
Neutrophils Relative %: 64 % (ref 43–77)
Platelets: 196 10*3/uL (ref 150–400)
RBC: 4.31 MIL/uL (ref 3.87–5.11)
RDW: 15.2 % (ref 11.5–15.5)
WBC: 10.2 10*3/uL (ref 4.0–10.5)

## 2013-12-14 LAB — COMPREHENSIVE METABOLIC PANEL
ALBUMIN: 3.6 g/dL (ref 3.5–5.2)
ALT: 29 U/L (ref 0–35)
AST: 34 U/L (ref 0–37)
Alkaline Phosphatase: 62 U/L (ref 39–117)
BILIRUBIN TOTAL: 1.1 mg/dL (ref 0.3–1.2)
BUN: 11 mg/dL (ref 6–23)
CHLORIDE: 98 meq/L (ref 96–112)
CO2: 27 mEq/L (ref 19–32)
CREATININE: 0.88 mg/dL (ref 0.50–1.10)
Calcium: 9 mg/dL (ref 8.4–10.5)
GFR calc Af Amer: 88 mL/min — ABNORMAL LOW (ref >90)
GFR calc non Af Amer: 76 mL/min — ABNORMAL LOW (ref >90)
Glucose, Bld: 99 mg/dL (ref 70–99)
Potassium: 4.7 mEq/L (ref 3.7–5.3)
Sodium: 137 mEq/L (ref 137–147)
Total Protein: 7.7 g/dL (ref 6.0–8.3)

## 2013-12-14 LAB — POCT I-STAT TROPONIN I: Troponin i, poc: 0.1 ng/mL (ref 0.00–0.08)

## 2013-12-14 LAB — RAPID URINE DRUG SCREEN, HOSP PERFORMED
Amphetamines: NOT DETECTED
BENZODIAZEPINES: NOT DETECTED
Barbiturates: NOT DETECTED
COCAINE: NOT DETECTED
Opiates: NOT DETECTED
TETRAHYDROCANNABINOL: POSITIVE — AB

## 2013-12-14 LAB — PROTIME-INR
INR: 1.1 (ref 0.00–1.49)
Prothrombin Time: 14 seconds (ref 11.6–15.2)

## 2013-12-14 LAB — TROPONIN I: Troponin I: 0.47 ng/mL (ref <0.30)

## 2013-12-14 MED ORDER — HEPARIN (PORCINE) IN NACL 100-0.45 UNIT/ML-% IJ SOLN
1200.0000 [IU]/h | INTRAMUSCULAR | Status: DC
  Administered 2013-12-14: 1000 [IU]/h via INTRAVENOUS
  Administered 2013-12-15: 1200 [IU]/h via INTRAVENOUS
  Filled 2013-12-14 (×2): qty 250

## 2013-12-14 MED ORDER — ASPIRIN 81 MG PO CHEW
CHEWABLE_TABLET | ORAL | Status: AC
  Administered 2013-12-14: 324 mg
  Filled 2013-12-14: qty 4

## 2013-12-14 MED ORDER — ASPIRIN 325 MG PO TABS
325.0000 mg | ORAL_TABLET | Freq: Every day | ORAL | Status: DC
  Filled 2013-12-14: qty 1

## 2013-12-14 MED ORDER — HEPARIN BOLUS VIA INFUSION
4000.0000 [IU] | Freq: Once | INTRAVENOUS | Status: AC
  Administered 2013-12-14: 4000 [IU] via INTRAVENOUS

## 2013-12-14 MED ORDER — ONDANSETRON HCL 4 MG/2ML IJ SOLN
INTRAMUSCULAR | Status: AC
  Administered 2013-12-14: 4 mg
  Filled 2013-12-14: qty 2

## 2013-12-14 MED ORDER — HYDROMORPHONE HCL PF 1 MG/ML IJ SOLN
1.0000 mg | Freq: Once | INTRAMUSCULAR | Status: AC
  Administered 2013-12-14: 1 mg via INTRAVENOUS
  Filled 2013-12-14: qty 1

## 2013-12-14 MED ORDER — PREDNISONE 10 MG PO TABS
60.0000 mg | ORAL_TABLET | Freq: Every day | ORAL | Status: DC
  Filled 2013-12-14 (×3): qty 1

## 2013-12-14 MED ORDER — MORPHINE SULFATE 4 MG/ML IJ SOLN
4.0000 mg | Freq: Once | INTRAMUSCULAR | Status: AC
  Administered 2013-12-14: 4 mg via INTRAVENOUS
  Filled 2013-12-14: qty 1

## 2013-12-14 MED ORDER — ONDANSETRON HCL 4 MG/2ML IJ SOLN
4.0000 mg | Freq: Once | INTRAMUSCULAR | Status: AC
  Administered 2013-12-14: 4 mg via INTRAVENOUS
  Filled 2013-12-14: qty 2

## 2013-12-14 NOTE — ED Notes (Signed)
Called [facility] to get Cardiology

## 2013-12-14 NOTE — ED Notes (Addendum)
Cough, diarrhea, released from [facility] 1/2,with dx of pneumonia,  No vomiting.    Eyes appear jaundiced.

## 2013-12-14 NOTE — ED Notes (Signed)
CRITICAL VALUE ALERT  Critical value received:  Troponin 0.47  Date of notification:  12/14/13  Time of notification:  2209  Critical value read back:yes   Nurse who received alert:  Bennetta Laos  MD notified (1st page):  lockwood  Time of first page:  2209  MD notified (2nd page):  Time of second page:  Responding MD:  lockwood  Time MD responded:  2209

## 2013-12-14 NOTE — H&P (Signed)
Patient's PCP: Provider Not In System Patient's Cardiologist: Dr. Mayme Genta, [facility]  Chief Complaint: Chest pain and shortness of breath  History of Present Illness: Kim [name] is a 48 y.o. Caucasian female with history of congestive heart failure both systolic and diastolic with EF of 35-40% based on a 2-D echocardiogram in August of 2013, cirrhosis, diabetes, GERD, A. Fib on anti-coagulation with noncompliance with Coumadin at times, pulmonary embolism, hepatitis C, anxiety, hypothyroidism, and COPD who presents with the above complaints.  Patient was recently hospitalized at [facility] on 12/03/2013 till 12/08/2013 for COPD exacerbation.  Subsequently after discharge, patient reports that she has been persistently short of breath with persistent substernal chest pain.  She presented to [facility] for further evaluation.  In the emergency department patient was found to have positive troponin of 0.47.  Given patient's multiple comorbidities, patient to be transferred to [facility] for further care and management.  Patient reports constant substernal chest pain with some diaphoresis at times.  Denies any jaw pain or pain running down her left arm.  Does complain of nonproductive cough.  Denies any shortness of breath.  Denies any abdominal pain or diarrhea.  Denies any headaches or vision changes.  Review of Systems: All systems reviewed with the patient and positive as per history of present illness, otherwise all other systems are negative.  Past Medical History  Diagnosis Date  . Pacemaker     vent paced  . COPD (chronic obstructive pulmonary disease)   . CHF (congestive heart failure)   . Thyroid disease   . Hepatitis     Hep C  . MI (myocardial infarction)   . Pneumonia   . Dysrhythmia     atrial fibrilation  . Cirrhosis   . Shortness of breath   . Diabetes mellitus   . Chest pain   . GERD (gastroesophageal reflux disease)   . Headache(784.0)   . Anxiety    . A-fib   . Chronic pain   . PE (pulmonary embolism)   . Hepatitis C    Past Surgical History  Procedure Laterality Date  . Cholecystectomy    . Pacemaker insertion    . Insert / replace / remove pacemaker      02/2012   Family History  Problem Relation Age of Onset  . Coronary artery disease Father 98  . Coronary artery disease Mother 3  . Coronary artery disease Brother    History   Social History  . Marital Status: Single    Spouse Name: N/A    Number of Children: N/A  . Years of Education: N/A   Occupational History  . Not on file.   Social History Main Topics  . Smoking status: Current Every Day Smoker -- 0.50 packs/day for 30 years    Types: Cigarettes  . Smokeless tobacco: Current User    Types: Snuff  . Alcohol Use: No  . Drug Use: No  . Sexual Activity: No   Other Topics Concern  . Not on file   Social History Narrative   Contact information: [phone] (cell), [phone] (landline)   Allergies: Nitroglycerin; Doxycycline; Flexeril; Ibuprofen; Tramadol; Tylenol; Vesicare; and Amoxil  Home Meds: Prior to Admission medications   Medication Sig Start Date End Date Taking? Authorizing Provider  albuterol (PROVENTIL HFA;VENTOLIN HFA) 108 (90 BASE) MCG/ACT inhaler Inhale 2 puffs into the lungs every 6 (six) hours as needed for wheezing or shortness of breath.    Yes Historical Provider, MD  atorvastatin (  LIPITOR) 80 MG tablet Take 80 mg by mouth at bedtime.   Yes Historical Provider, MD  benzonatate (TESSALON) 100 MG capsule Take 100 mg by mouth 3 (three) times daily as needed. cough 12/08/13 12/15/13 Yes Historical Provider, MD  budesonide-formoterol (SYMBICORT) 80-4.5 MCG/ACT inhaler Inhale 2 puffs into the lungs 2 (two) times daily.   Yes Historical Provider, MD  carvedilol (COREG) 6.25 MG tablet Take 6.25 mg by mouth 2 (two) times daily with a meal.   Yes Historical Provider, MD  esomeprazole (NEXIUM) 40 MG capsule Take 40 mg by mouth daily before  breakfast.   Yes Historical Provider, MD  furosemide (LASIX) 80 MG tablet Take 80 mg by mouth 2 (two) times daily.   Yes Historical Provider, MD  HYDROcodone-acetaminophen (NORCO/VICODIN) 5-325 MG per tablet Take 1 tablet by mouth every 4 (four) hours as needed. pain 12/08/13 12/18/13 Yes Historical Provider, MD  potassium chloride (K-DUR,KLOR-CON) 10 MEQ tablet Take 40 mEq by mouth daily.   Yes Historical Provider, MD  QUEtiapine (SEROQUEL) 25 MG tablet Take 50 mg by mouth 2 (two) times daily. 12/08/13  Yes Historical Provider, MD  sertraline (ZOLOFT) 50 MG tablet Take 50 mg by mouth daily.   Yes Historical Provider, MD  tiotropium (SPIRIVA) 18 MCG inhalation capsule Place 18 mcg into inhaler and inhale daily.   Yes Historical Provider, MD  traZODone (DESYREL) 100 MG tablet Take 1 tablet (100 mg total) by mouth at bedtime. 10/11/13  Yes Hazeline Junker, MD  warfarin (COUMADIN) 4 MG tablet Take 4 mg by mouth daily. 12/08/13 12/08/14 Yes Historical Provider, MD    Physical Exam: Blood pressure 147/130, pulse 77, temperature 97.4 F (36.3 C), temperature source Oral, resp. rate 16, height 5\' 9"  (1.753 m), weight 110.224 kg (243 lb), last menstrual period 02/08/2007, SpO2 99.00%. General: Awake, Oriented x3, No acute distress. HEENT: EOMI, Moist mucous membranes Neck: Supple CV: S1 and S2 Lungs: Scattered wheezing bilaterally, moderate air movement. Abdomen: Soft, Nontender, Nondistended, +bowel sounds. Ext: Good pulses. Trace edema. No clubbing or cyanosis noted. Neuro: Cranial Nerves II-XII grossly intact. Has 5/5 motor strength in upper and lower extremities.  Lab results:  Recent Labs  12/14/13 2055  NA 137  K 4.7  CL 98  CO2 27  GLUCOSE 99  BUN 11  CREATININE 0.88  CALCIUM 9.0    Recent Labs  12/14/13 2055  AST 34  ALT 29  ALKPHOS 62  BILITOT 1.1  PROT 7.7  ALBUMIN 3.6   No results found for this basename: LIPASE, AMYLASE,  in the last 72 hours  Recent Labs  12/14/13 2055  WBC  10.2  NEUTROABS 6.5  HGB 14.9  HCT 44.2  MCV 102.6*  PLT 196    Recent Labs  12/14/13 2055  TROPONINI 0.47*   No components found with this basename: POCBNP,  No results found for this basename: DDIMER,  in the last 72 hours No results found for this basename: HGBA1C,  in the last 72 hours No results found for this basename: CHOL, HDL, LDLCALC, TRIG, CHOLHDL, LDLDIRECT,  in the last 72 hours No results found for this basename: TSH, T4TOTAL, FREET3, T3FREE, THYROIDAB,  in the last 72 hours No results found for this basename: VITAMINB12, FOLATE, FERRITIN, TIBC, IRON, RETICCTPCT,  in the last 72 hours Imaging results:  Dg Chest 2 View  12/14/2013   CLINICAL DATA:  Fever, cough, congestion.  EXAM: CHEST  2 VIEW  COMPARISON:  10/06/2013.  FINDINGS: Chronic cardiomegaly. Dual-chamber pacer from  the left shows no interval displacement. Mitral annular calcification. No infiltrate or edema. No effusion or pneumothorax.  IMPRESSION: No active cardiopulmonary disease.   Electronically Signed   By: Tiburcio PeaJonathan  Watts M.D.   On: 12/14/2013 20:56   Other results: EKG: Ventricular paced rhythm with heart rate of 84.  Assessment & Plan by Problem: Chest pain/non-ST relation MI Patient started on heparin drip.  Continue aspirin which was initiated in the emergency department.  Request 2-D echocardiogram in the morning.  Patient needs a cardiology consult [facility], on-call cardiology aware of the patient.  Patient has an allergy to nitroglycerin.  Continue oxygen.  ACS protocol initiated.  Monitor on telemetry, admit the patient to step down.  Check hemoglobin A1c and lipid panel in the morning to restart by the patient.  Acute respiratory failure due to COPD exacerbation Start patient on Solu-Medrol 40 mg IV Q8 hours as she still has ongoing wheezing on exam.  Start the patient on azithromycin for 5 days.  Continue nebulizer treatment.  Atrial fibrillation status post pacemaker Rate  controlled.  Currently on a paced rhythm.  Patient reports that she has not taken her Coumadin for the last 2 days, suspect a degree of noncompliance.  Currently anticoagulated on heparin.  Continue home Coumadin.  Reports intolerance to Lesia HausenXaralto in the past.  May benefit from discussion with her cardiologist about her options of anticoagulation after discharge.  Chronic systolic congestive heart failure Last available EF 35-40% based on a 2-D echocardiogram in August of 2013.  Currently compensated.  Continue home diuretics.  Requested another echocardiogram as indicated above.  Tobacco abuse Counseled on cessation.  Patient declined nicotine patch.  Cirrhosis due to hepatitis C Patient has been instructed to followup with hepatology after discharge.  Diabetes? Last available hemoglobin A1c was 6.0 on 02/20/2013.  Sliding scale insulin.  May be diet controlled.  Depression with anxiety Stable.  Prophylaxis Heparin drip.  Continue on Coumadin.  CODE STATUS Full code.  Disposition [facility] to step [facility].  Time spent on admission, talking to the patient, and coordinating care was: 60 mins.  Azlan Hanway A, MD 12/14/2013, 11:56 PM

## 2013-12-14 NOTE — Progress Notes (Signed)
ANTICOAGULATION CONSULT NOTE - Initial Consult  Pharmacy Consult for Heparin Indication: chest pain/ACS  Allergies  Allergen Reactions  . Nitroglycerin Other (See Comments)    CAUSED NUMBNESS ALL OVER  . Doxycycline Itching  . Flexeril [Cyclobenzaprine] Other (See Comments)    Sweating, Lightheaded   . Ibuprofen Other (See Comments)    Increase BP, dizziness, lightheaded  . Tramadol Itching  . Tylenol [Acetaminophen] Other (See Comments)    Pt has Hep C  . Vesicare [Solifenacin] Other (See Comments)    Makes my sweat turn yellow  . Amoxil [Amoxicillin] Nausea Only    Patient Measurements: Height: 5\' 9"  (175.3 cm) Weight: 243 lb (110.224 kg) IBW/kg (Calculated) : 66.2 Heparin Dosing Weight: 83Kg  Vital Signs: Temp: 97.4 F (36.3 C) (01/08 1926) Temp src: Oral (01/08 1926) BP: 159/91 mmHg (01/08 1926) Pulse Rate: 80 (01/08 1926)  Labs:  Recent Labs  12/14/13 2055  HGB 14.9  HCT 44.2  PLT 196  LABPROT 14.0  INR 1.10  CREATININE 0.88  TROPONINI 0.47*    Estimated Creatinine Clearance: 102.3 ml/min (by C-G formula based on Cr of 0.88).  Medical History: Past Medical History  Diagnosis Date  . Pacemaker     vent paced  . COPD (chronic obstructive pulmonary disease)   . CHF (congestive heart failure)   . Thyroid disease   . Hepatitis     Hep C  . MI (myocardial infarction)   . Pneumonia   . Dysrhythmia     atrial fibrilation  . Cirrhosis   . Shortness of breath   . Diabetes mellitus   . Chest pain   . GERD (gastroesophageal reflux disease)   . Headache(784.0)   . Anxiety   . A-fib   . Chronic pain   . PE (pulmonary embolism)   . Hepatitis C    Assessment: 47yo obese female admitted c/o chest pain.  Asked to initiate IV Heparin for ACS.    Goal of Therapy:  Heparin level 0.3-0.7 units/ml Monitor platelets by anticoagulation protocol: Yes   Plan:  Heparin 4000 units IV bolus now x 1 Heparin infusion at 12 units/Kg/hr using adjusted  BW Heparin level daily while on heparin CBC daily while on heparin  Margo Aye, Mahlia Fernando A 12/14/2013,10:25 PM

## 2013-12-14 NOTE — ED Provider Notes (Addendum)
CSN: [id number]     Arrival date & time 12/14/13  4098 History  This chart was scribed for Gerhard Munch, MD by Dorothey Baseman, ED Scribe. This patient was seen in room APA09/APA09 and the patient's care was started at 7:35 PM.    Chief Complaint  Patient presents with  . Cough   The history is provided by the patient. No language interpreter was used.   HPI Comments: Kim [name] is a 48 y.o. Female with a history of COPD and pneumonia who presents to the Emergency Department complaining of an intermittent, productive cough with associated nausea, diarrhea, and congestion. She reports some associated chest discomfort secondary to the cough. Patient was admitted at [facility] for similar complaints and was discharged on 12/08/2013, but states that her symptoms have persisted, especially the cough. She denies fever, emesis. Patient also has a history of CHF, MI, DM, atrial fibrillation, pulmonary embolism, cirrhosis, and hepatitis C. Patient is currently on Coumadin and has a pacemaker in-place. Patient is a current every day smoker, 0.5 PPD, but does not drink.   Past Medical History  Diagnosis Date  . Pacemaker     vent paced  . COPD (chronic obstructive pulmonary disease)   . CHF (congestive heart failure)   . Thyroid disease   . Hepatitis     Hep C  . MI (myocardial infarction)   . Pneumonia   . Dysrhythmia     atrial fibrilation  . Cirrhosis   . Shortness of breath   . Diabetes mellitus   . Chest pain   . GERD (gastroesophageal reflux disease)   . Headache(784.0)   . Anxiety   . A-fib   . Chronic pain   . PE (pulmonary embolism)   . Hepatitis C    Past Surgical History  Procedure Laterality Date  . Cholecystectomy    . Pacemaker insertion    . Insert / replace / remove pacemaker      02/2012   Family History  Problem Relation Age of Onset  . Coronary artery disease Father 27  . Coronary artery disease Mother 29  . Coronary artery disease Brother    History   Substance Use Topics  . Smoking status: Current Every Day Smoker -- 0.50 packs/day for 30 years    Types: Cigarettes  . Smokeless tobacco: Current User    Types: Snuff  . Alcohol Use: No   OB History   Grav Para Term Preterm Abortions TAB SAB Ect Mult Living                 Review of Systems  Constitutional: Negative for fever.       Per HPI, otherwise negative  HENT: Positive for congestion.        Per HPI, otherwise negative  Respiratory: Positive for cough.        Per HPI, otherwise negative  Cardiovascular: Positive for chest pain.       Per HPI, otherwise negative  Gastrointestinal: Positive for nausea and diarrhea. Negative for vomiting.  Endocrine:       Negative aside from HPI  Genitourinary:       Neg aside from HPI   Musculoskeletal:       Per HPI, otherwise negative  Skin: Negative.   Neurological: Negative for syncope.    Allergies  Nitroglycerin; Doxycycline; Flexeril; Ibuprofen; Tramadol; Tylenol; Vesicare; and Amoxil  Home Medications   Current Outpatient Rx  Name  Route  Sig  Dispense  Refill  .  albuterol (PROVENTIL HFA;VENTOLIN HFA) 108 (90 BASE) MCG/ACT inhaler   Inhalation   Inhale 2 puffs into the lungs every 6 (six) hours as needed for wheezing or shortness of breath.          Marland Kitchen atorvastatin (LIPITOR) 80 MG tablet   Oral   Take 80 mg by mouth at bedtime.         . budesonide-formoterol (SYMBICORT) 80-4.5 MCG/ACT inhaler   Inhalation   Inhale 2 puffs into the lungs 2 (two) times daily.         . carvedilol (COREG) 6.25 MG tablet   Oral   Take 6.25 mg by mouth 2 (two) times daily with a meal.         . esomeprazole (NEXIUM) 40 MG capsule   Oral   Take 40 mg by mouth daily before breakfast.         . furosemide (LASIX) 80 MG tablet   Oral   Take 80 mg by mouth 2 (two) times daily.         . potassium chloride (K-DUR,KLOR-CON) 10 MEQ tablet   Oral   Take 40 mEq by mouth daily.         . Rivaroxaban (XARELTO) 20 MG  TABS tablet   Oral   Take 1 tablet (20 mg total) by mouth daily with supper.   30 tablet   1   . sertraline (ZOLOFT) 50 MG tablet   Oral   Take 50 mg by mouth daily.         . silver sulfADIAZINE (SILVADENE) 1 % cream   Topical   Apply topically daily.   50 g   0   . tiotropium (SPIRIVA) 18 MCG inhalation capsule   Inhalation   Place 18 mcg into inhaler and inhale daily.         . traZODone (DESYREL) 100 MG tablet   Oral   Take 1 tablet (100 mg total) by mouth at bedtime.   30 tablet   0    Triage Vitals: BP 159/91  Pulse 80  Temp(Src) 97.4 F (36.3 C) (Oral)  Resp 18  Ht 5\' 9"  (1.753 m)  Wt 230 lb (104.327 kg)  BMI 33.95 kg/m2  SpO2 93%  LMP 02/08/2007  Physical Exam  Nursing note and vitals reviewed. Constitutional: She is oriented to person, place, and time. She appears well-developed and well-nourished. No distress.  HENT:  Head: Normocephalic and atraumatic.  Eyes: Conjunctivae and EOM are normal.  Cardiovascular: Normal rate, regular rhythm and normal heart sounds.   Pulmonary/Chest: No stridor. She has no wheezes.  Tachypneic with shallow respirations, but no wheezes.  Abdominal: She exhibits no distension.  Musculoskeletal: She exhibits no edema.  Neurological: She is alert and oriented to person, place, and time. No cranial nerve deficit.  Skin: Skin is warm and dry.  Psychiatric: She has a normal mood and affect.    ED Course  Procedures (including critical care time)  DIAGNOSTIC STUDIES: Oxygen Saturation is 93% on room air, adequate by my interpretation.    COORDINATION OF CARE: 7:42 PM- Will order blood labs and a chest x-ray. Will order Dilaudid and prednisone to manage symptoms. Discussed treatment plan with patient at bedside and patient verbalized agreement.   Update: point of care troponin is elevated.  Patient continued to deny chest pain.  After the initial evaluation reviewing patient's chart, including records from outside  hospital.  Patient was admitted 2 weeks ago for pneumonia, respiratory distress.  Temperature echocardiogram with ejection fraction 55%, CT angiography to rule out PE, this was negative.    Update: Formal troponin was positive.  Heparin ordered.  Patient requests something for pain.  I discussed the patient with our cardiology team - they will follow as consultants (Dr. Adolm JosephWhitlock)  Update: The patient's labs are back, notable for elevated BNP, though this is decreased from her most recent admission here. Patient also has a subtherapeutic INR.  She now states that she does not take her medication consistently.  Labs Review Labs Reviewed  CBC WITH DIFFERENTIAL - Abnormal; Notable for the following:    MCV 102.6 (*)    MCH 34.6 (*)    Monocytes Absolute 1.2 (*)    All other components within normal limits  COMPREHENSIVE METABOLIC PANEL - Abnormal; Notable for the following:    GFR calc non Af Amer 76 (*)    GFR calc Af Amer 88 (*)    All other components within normal limits  PRO B NATRIURETIC PEPTIDE - Abnormal; Notable for the following:    Pro B Natriuretic peptide (BNP) 1144.0 (*)    All other components within normal limits  URINALYSIS, ROUTINE W REFLEX MICROSCOPIC - Abnormal; Notable for the following:    Specific Gravity, Urine >1.030 (*)    Bilirubin Urine SMALL (*)    All other components within normal limits  TROPONIN I - Abnormal; Notable for the following:    Troponin I 0.47 (*)    All other components within normal limits  POCT I-STAT TROPONIN I - Abnormal; Notable for the following:    Troponin i, poc 0.10 (*)    All other components within normal limits  PROTIME-INR  HEPARIN LEVEL (UNFRACTIONATED)  CBC   Imaging Review Dg Chest 2 View  12/14/2013   CLINICAL DATA:  Fever, cough, congestion.  EXAM: CHEST  2 VIEW  COMPARISON:  10/06/2013.  FINDINGS: Chronic cardiomegaly. Dual-chamber pacer from the left shows no interval displacement. Mitral annular calcification. No  infiltrate or edema. No effusion or pneumothorax.  IMPRESSION: No active cardiopulmonary disease.   Electronically Signed   By: Tiburcio PeaJonathan  Watts M.D.   On: 12/14/2013 20:56    EKG Interpretation    Date/Time:  Thursday December 14 2013 19:14:7820:08:22 EST Ventricular Rate:  84 PR Interval:    QRS Duration: 182 QT Interval:  478 QTC Calculation: 564 R Axis:   -87 Text Interpretation:  Ventricular-paced rhythm Abnormal ECG When compared with ECG of 16-Jul-2013 00:15, Electronic ventricular pacemaker has replaced Electronic atrial pacemaker Vent. rate has decreased BY  73 BPM VENTRICULAR PACING No significant change since last tracing Abnormal ekg Confirmed by Gerhard MunchLOCKWOOD, Makenzie Vittorio  MD [phone]([id number]) on 12/14/2013 10:49:43 PM           I discussed the necessity of smoking cessation with the patient.   MDM  No diagnosis found.  I personally performed the services described in this documentation, which was scribed in my presence. The recorded information has been reviewed and is accurate.   Patient presents with multiple complaints.  On exam patient is awake, alert, hemodynamically stable.  With her history, description of cough, generalized complaints, Differentials considered.  Most notably, the patient's evaluation demonstrated elevated troponin concerning for NSTEMI.  After these results became available, the patient started on a heparin drip. Patient had pain resolution. Patient required admission/transfer after initiation of heparin drip, provision of analgesia.   CRITICAL CARE Performed by: Gerhard MunchLOCKWOOD, Jaymon Dudek Total critical care time: 40 Critical care time was exclusive of  separately billable procedures and treating other patients. Critical care was necessary to treat or prevent imminent or life-threatening deterioration. Critical care was time spent personally by me on the following activities: development of treatment plan with patient and/or surrogate as well as nursing, discussions with  consultants, evaluation of patient's response to treatment, examination of patient, obtaining history from patient or surrogate, ordering and performing treatments and interventions, ordering and review of laboratory studies, ordering and review of radiographic studies, pulse oximetry and re-evaluation of patient's condition.;    Gerhard Munch, MD 12/14/13 2322  Gerhard Munch, MD 12/14/13 2329

## 2013-12-14 NOTE — ED Notes (Signed)
Patient denies chest pain but states it hurts to void.

## 2013-12-15 ENCOUNTER — Encounter (HOSPITAL_COMMUNITY): Payer: Self-pay | Admitting: Physician Assistant

## 2013-12-15 ENCOUNTER — Telehealth: Payer: Self-pay | Admitting: Cardiology

## 2013-12-15 DIAGNOSIS — I517 Cardiomegaly: Secondary | ICD-10-CM

## 2013-12-15 DIAGNOSIS — I4891 Unspecified atrial fibrillation: Secondary | ICD-10-CM

## 2013-12-15 DIAGNOSIS — I10 Essential (primary) hypertension: Secondary | ICD-10-CM

## 2013-12-15 DIAGNOSIS — I509 Heart failure, unspecified: Secondary | ICD-10-CM

## 2013-12-15 DIAGNOSIS — R7989 Other specified abnormal findings of blood chemistry: Secondary | ICD-10-CM

## 2013-12-15 LAB — PROTIME-INR
INR: 1.1 (ref 0.00–1.49)
Prothrombin Time: 14 seconds (ref 11.6–15.2)

## 2013-12-15 LAB — COMPREHENSIVE METABOLIC PANEL
ALBUMIN: 3.5 g/dL (ref 3.5–5.2)
ALT: 35 U/L (ref 0–35)
AST: 52 U/L — AB (ref 0–37)
Alkaline Phosphatase: 66 U/L (ref 39–117)
BILIRUBIN TOTAL: 1.6 mg/dL — AB (ref 0.3–1.2)
BUN: 11 mg/dL (ref 6–23)
CALCIUM: 8.4 mg/dL (ref 8.4–10.5)
CO2: 24 mEq/L (ref 19–32)
Chloride: 106 mEq/L (ref 96–112)
Creatinine, Ser: 0.8 mg/dL (ref 0.50–1.10)
GFR calc Af Amer: >90 mL/min (ref >90)
GFR, EST NON AFRICAN AMERICAN: 85 mL/min — AB (ref >90)
Glucose, Bld: 116 mg/dL — ABNORMAL HIGH (ref 70–99)
Potassium: 4.2 mEq/L (ref 3.7–5.3)
Sodium: 142 mEq/L (ref 137–147)
Total Protein: 7.1 g/dL (ref 6.0–8.3)

## 2013-12-15 LAB — LIPID PANEL
CHOL/HDL RATIO: 3.3 ratio
Cholesterol: 192 mg/dL (ref 0–200)
HDL: 58 mg/dL (ref >39)
LDL CALC: 114 mg/dL — AB (ref 0–99)
Triglycerides: 99 mg/dL (ref <150)
VLDL: 20 mg/dL (ref 0–40)

## 2013-12-15 LAB — TROPONIN I
TROPONIN I: 0.43 ng/mL — AB (ref <0.30)
Troponin I: <0.3 ng/mL (ref <0.30)

## 2013-12-15 LAB — GLUCOSE, CAPILLARY
GLUCOSE-CAPILLARY: 214 mg/dL — AB (ref 70–99)
Glucose-Capillary: 121 mg/dL — ABNORMAL HIGH (ref 70–99)
Glucose-Capillary: 159 mg/dL — ABNORMAL HIGH (ref 70–99)
Glucose-Capillary: 209 mg/dL — ABNORMAL HIGH (ref 70–99)
Glucose-Capillary: 258 mg/dL — ABNORMAL HIGH (ref 70–99)

## 2013-12-15 LAB — MRSA PCR SCREENING: MRSA by PCR: POSITIVE — AB

## 2013-12-15 LAB — HEMOGLOBIN A1C
HEMOGLOBIN A1C: 6.4 % — AB (ref <5.7)
MEAN PLASMA GLUCOSE: 137 mg/dL — AB (ref <117)

## 2013-12-15 LAB — HEPARIN LEVEL (UNFRACTIONATED): Heparin Unfractionated: <0.1 IU/mL — ABNORMAL LOW (ref 0.30–0.70)

## 2013-12-15 MED ORDER — MORPHINE SULFATE 2 MG/ML IJ SOLN
2.0000 mg | INTRAMUSCULAR | Status: AC | PRN
  Administered 2013-12-15: 2 mg via INTRAVENOUS
  Filled 2013-12-15: qty 1

## 2013-12-15 MED ORDER — FUROSEMIDE 10 MG/ML IJ SOLN
80.0000 mg | Freq: Once | INTRAMUSCULAR | Status: AC
  Administered 2013-12-15: 80 mg via INTRAVENOUS
  Filled 2013-12-15: qty 8

## 2013-12-15 MED ORDER — LEVALBUTEROL HCL 0.63 MG/3ML IN NEBU
0.6300 mg | INHALATION_SOLUTION | RESPIRATORY_TRACT | Status: DC | PRN
  Filled 2013-12-15: qty 3

## 2013-12-15 MED ORDER — INSULIN ASPART 100 UNIT/ML ~~LOC~~ SOLN
0.0000 [IU] | Freq: Every day | SUBCUTANEOUS | Status: DC
Start: 2013-12-15 — End: 2013-12-17
  Administered 2013-12-15: 3 [IU] via SUBCUTANEOUS
  Administered 2013-12-16: 4 [IU] via SUBCUTANEOUS

## 2013-12-15 MED ORDER — MORPHINE SULFATE 2 MG/ML IJ SOLN
2.0000 mg | INTRAMUSCULAR | Status: DC | PRN
  Administered 2013-12-15: 2 mg via INTRAVENOUS
  Filled 2013-12-15: qty 1

## 2013-12-15 MED ORDER — BUDESONIDE 0.25 MG/2ML IN SUSP
0.2500 mg | Freq: Two times a day (BID) | RESPIRATORY_TRACT | Status: DC
  Administered 2013-12-15 – 2013-12-17 (×4): 0.25 mg via RESPIRATORY_TRACT
  Filled 2013-12-15 (×6): qty 2

## 2013-12-15 MED ORDER — ASPIRIN 81 MG PO CHEW
81.0000 mg | CHEWABLE_TABLET | Freq: Every day | ORAL | Status: DC
  Administered 2013-12-15 – 2013-12-17 (×3): 81 mg via ORAL
  Filled 2013-12-15 (×3): qty 1

## 2013-12-15 MED ORDER — ALBUTEROL SULFATE HFA 108 (90 BASE) MCG/ACT IN AERS: 2.0000 | INHALATION_SPRAY | Freq: Four times a day (QID) | RESPIRATORY_TRACT | Status: DC | PRN

## 2013-12-15 MED ORDER — QUETIAPINE FUMARATE 50 MG PO TABS
50.0000 mg | ORAL_TABLET | Freq: Two times a day (BID) | ORAL | Status: DC
  Administered 2013-12-15 – 2013-12-17 (×6): 50 mg via ORAL
  Filled 2013-12-15 (×7): qty 1

## 2013-12-15 MED ORDER — AZITHROMYCIN 250 MG PO TABS
250.0000 mg | ORAL_TABLET | Freq: Every day | ORAL | Status: DC
  Administered 2013-12-15 – 2013-12-17 (×3): 250 mg via ORAL
  Filled 2013-12-15 (×3): qty 1

## 2013-12-15 MED ORDER — INSULIN ASPART 100 UNIT/ML ~~LOC~~ SOLN
0.0000 [IU] | Freq: Three times a day (TID) | SUBCUTANEOUS | Status: DC
Start: 2013-12-15 — End: 2013-12-17
  Administered 2013-12-15 (×2): 3 [IU] via SUBCUTANEOUS
  Administered 2013-12-15 – 2013-12-16 (×2): 2 [IU] via SUBCUTANEOUS
  Administered 2013-12-16: 3 [IU] via SUBCUTANEOUS
  Administered 2013-12-16: 2 [IU] via SUBCUTANEOUS
  Administered 2013-12-17: 5 [IU] via SUBCUTANEOUS

## 2013-12-15 MED ORDER — IPRATROPIUM BROMIDE 0.02 % IN SOLN
0.5000 mg | Freq: Four times a day (QID) | RESPIRATORY_TRACT | Status: DC
  Administered 2013-12-15 (×3): 0.5 mg via RESPIRATORY_TRACT
  Filled 2013-12-15 (×4): qty 2.5

## 2013-12-15 MED ORDER — FUROSEMIDE 80 MG PO TABS
80.0000 mg | ORAL_TABLET | Freq: Every day | ORAL | Status: DC
  Administered 2013-12-15: 80 mg via ORAL

## 2013-12-15 MED ORDER — GUAIFENESIN-DM 100-10 MG/5ML PO SYRP
5.0000 mL | ORAL_SOLUTION | ORAL | Status: DC | PRN
Start: 2013-12-15 — End: 2013-12-17
  Filled 2013-12-15: qty 5

## 2013-12-15 MED ORDER — LEVALBUTEROL HCL 0.63 MG/3ML IN NEBU
0.6300 mg | INHALATION_SOLUTION | Freq: Four times a day (QID) | RESPIRATORY_TRACT | Status: DC
  Administered 2013-12-15 (×3): 0.63 mg via RESPIRATORY_TRACT
  Filled 2013-12-15 (×7): qty 3

## 2013-12-15 MED ORDER — BUDESONIDE-FORMOTEROL FUMARATE 80-4.5 MCG/ACT IN AERO
2.0000 | INHALATION_SPRAY | Freq: Two times a day (BID) | RESPIRATORY_TRACT | Status: DC
  Administered 2013-12-15: 2 via RESPIRATORY_TRACT
  Filled 2013-12-15: qty 6.9

## 2013-12-15 MED ORDER — ENOXAPARIN SODIUM 120 MG/0.8ML ~~LOC~~ SOLN
105.0000 mg | Freq: Two times a day (BID) | SUBCUTANEOUS | Status: DC
  Administered 2013-12-15 – 2013-12-16 (×2): 105 mg via SUBCUTANEOUS
  Filled 2013-12-15 (×4): qty 0.8

## 2013-12-15 MED ORDER — METHYLPREDNISOLONE SODIUM SUCC 40 MG IJ SOLR
40.0000 mg | Freq: Three times a day (TID) | INTRAMUSCULAR | Status: DC
  Administered 2013-12-15: 40 mg via INTRAVENOUS
  Filled 2013-12-15 (×4): qty 1

## 2013-12-15 MED ORDER — PERFLUTREN LIPID MICROSPHERE
INTRAVENOUS | Status: AC
  Filled 2013-12-15: qty 10

## 2013-12-15 MED ORDER — SERTRALINE HCL 50 MG PO TABS
50.0000 mg | ORAL_TABLET | Freq: Every day | ORAL | Status: DC
  Administered 2013-12-15 – 2013-12-17 (×3): 50 mg via ORAL
  Filled 2013-12-15 (×3): qty 1

## 2013-12-15 MED ORDER — WARFARIN - PHARMACIST DOSING INPATIENT
Freq: Every day | Status: DC
  Administered 2013-12-15: 18:00:00

## 2013-12-15 MED ORDER — ONDANSETRON HCL 4 MG PO TABS: 4.0000 mg | ORAL_TABLET | Freq: Four times a day (QID) | ORAL | Status: DC | PRN

## 2013-12-15 MED ORDER — FUROSEMIDE 80 MG PO TABS
80.0000 mg | ORAL_TABLET | Freq: Two times a day (BID) | ORAL | Status: DC
  Filled 2013-12-15 (×3): qty 1

## 2013-12-15 MED ORDER — CARVEDILOL 6.25 MG PO TABS
6.2500 mg | ORAL_TABLET | Freq: Two times a day (BID) | ORAL | Status: DC
  Filled 2013-12-15 (×3): qty 1

## 2013-12-15 MED ORDER — SODIUM CHLORIDE 0.9 % IJ SOLN
3.0000 mL | Freq: Two times a day (BID) | INTRAMUSCULAR | Status: DC
  Administered 2013-12-15 – 2013-12-17 (×5): 3 mL via INTRAVENOUS

## 2013-12-15 MED ORDER — POTASSIUM CHLORIDE CRYS ER 20 MEQ PO TBCR
40.0000 meq | EXTENDED_RELEASE_TABLET | Freq: Every day | ORAL | Status: DC
  Administered 2013-12-15 – 2013-12-17 (×3): 40 meq via ORAL
  Filled 2013-12-15 (×3): qty 2

## 2013-12-15 MED ORDER — BENZONATATE 100 MG PO CAPS
100.0000 mg | ORAL_CAPSULE | Freq: Three times a day (TID) | ORAL | Status: DC | PRN
  Filled 2013-12-15: qty 1

## 2013-12-15 MED ORDER — NYSTATIN 100000 UNIT/GM EX OINT
TOPICAL_OINTMENT | Freq: Three times a day (TID) | CUTANEOUS | Status: DC
  Administered 2013-12-15 – 2013-12-17 (×5): via TOPICAL
  Filled 2013-12-15 (×4): qty 15

## 2013-12-15 MED ORDER — FUROSEMIDE 80 MG PO TABS
80.0000 mg | ORAL_TABLET | Freq: Two times a day (BID) | ORAL | Status: DC
  Administered 2013-12-15 – 2013-12-17 (×4): 80 mg via ORAL
  Filled 2013-12-15 (×6): qty 1

## 2013-12-15 MED ORDER — LEVALBUTEROL HCL 0.63 MG/3ML IN NEBU
0.6300 mg | INHALATION_SOLUTION | Freq: Two times a day (BID) | RESPIRATORY_TRACT | Status: DC
  Administered 2013-12-16 – 2013-12-17 (×3): 0.63 mg via RESPIRATORY_TRACT
  Filled 2013-12-15 (×5): qty 3

## 2013-12-15 MED ORDER — ALPRAZOLAM 0.5 MG PO TABS
0.5000 mg | ORAL_TABLET | Freq: Once | ORAL | Status: AC | PRN
  Administered 2013-12-15: 0.5 mg via ORAL
  Filled 2013-12-15: qty 1

## 2013-12-15 MED ORDER — PERFLUTREN LIPID MICROSPHERE
1.0000 mL | INTRAVENOUS | Status: AC | PRN
  Administered 2013-12-15: 2 mL via INTRAVENOUS
  Filled 2013-12-15: qty 10

## 2013-12-15 MED ORDER — HEPARIN BOLUS VIA INFUSION
2000.0000 [IU] | Freq: Once | INTRAVENOUS | Status: AC
  Administered 2013-12-15: 2000 [IU] via INTRAVENOUS
  Filled 2013-12-15: qty 2000

## 2013-12-15 MED ORDER — PANTOPRAZOLE SODIUM 40 MG PO TBEC
80.0000 mg | DELAYED_RELEASE_TABLET | Freq: Every day | ORAL | Status: DC
  Administered 2013-12-15 – 2013-12-17 (×3): 80 mg via ORAL
  Filled 2013-12-15 (×3): qty 2

## 2013-12-15 MED ORDER — TRAZODONE HCL 100 MG PO TABS
100.0000 mg | ORAL_TABLET | Freq: Every day | ORAL | Status: DC
  Administered 2013-12-15 – 2013-12-16 (×3): 100 mg via ORAL
  Filled 2013-12-15 (×4): qty 1

## 2013-12-15 MED ORDER — TIOTROPIUM BROMIDE MONOHYDRATE 18 MCG IN CAPS
18.0000 ug | ORAL_CAPSULE | Freq: Every day | RESPIRATORY_TRACT | Status: DC
  Filled 2013-12-15: qty 5

## 2013-12-15 MED ORDER — ALBUTEROL SULFATE (2.5 MG/3ML) 0.083% IN NEBU: 2.5000 mg | INHALATION_SOLUTION | RESPIRATORY_TRACT | Status: DC | PRN

## 2013-12-15 MED ORDER — LEVALBUTEROL HCL 0.63 MG/3ML IN NEBU: 0.6300 mg | INHALATION_SOLUTION | Freq: Four times a day (QID) | RESPIRATORY_TRACT | Status: DC | PRN

## 2013-12-15 MED ORDER — WARFARIN SODIUM 5 MG PO TABS
5.0000 mg | ORAL_TABLET | Freq: Once | ORAL | Status: AC
  Administered 2013-12-15: 5 mg via ORAL
  Filled 2013-12-15 (×2): qty 1

## 2013-12-15 MED ORDER — IPRATROPIUM BROMIDE 0.02 % IN SOLN
0.5000 mg | Freq: Two times a day (BID) | RESPIRATORY_TRACT | Status: DC
Start: 2013-12-16 — End: 2013-12-17
  Administered 2013-12-16 – 2013-12-17 (×3): 0.5 mg via RESPIRATORY_TRACT
  Filled 2013-12-15 (×3): qty 2.5

## 2013-12-15 MED ORDER — HYDROCODONE-ACETAMINOPHEN 5-325 MG PO TABS
1.0000 | ORAL_TABLET | ORAL | Status: DC | PRN
  Administered 2013-12-15 – 2013-12-17 (×5): 1 via ORAL
  Filled 2013-12-15 (×5): qty 1

## 2013-12-15 MED ORDER — ONDANSETRON HCL 4 MG/2ML IJ SOLN: 4.0000 mg | Freq: Four times a day (QID) | INTRAMUSCULAR | Status: DC | PRN

## 2013-12-15 MED ORDER — ATORVASTATIN CALCIUM 80 MG PO TABS
80.0000 mg | ORAL_TABLET | Freq: Every day | ORAL | Status: DC
  Administered 2013-12-15 – 2013-12-16 (×2): 80 mg via ORAL
  Filled 2013-12-15 (×3): qty 1

## 2013-12-15 MED ORDER — WARFARIN SODIUM 4 MG PO TABS: 4.0000 mg | ORAL_TABLET | Freq: Every day | ORAL | Status: DC

## 2013-12-15 MED ORDER — METHYLPREDNISOLONE SODIUM SUCC 40 MG IJ SOLR
40.0000 mg | Freq: Two times a day (BID) | INTRAMUSCULAR | Status: DC
  Administered 2013-12-16: 40 mg via INTRAVENOUS
  Filled 2013-12-15 (×3): qty 1

## 2013-12-15 MED ORDER — METOPROLOL TARTRATE 25 MG PO TABS
25.0000 mg | ORAL_TABLET | Freq: Three times a day (TID) | ORAL | Status: DC
  Administered 2013-12-15 – 2013-12-16 (×4): 25 mg via ORAL
  Filled 2013-12-15 (×7): qty 1

## 2013-12-15 NOTE — ED Notes (Signed)
Patient stateds she does not want to go to [place] but stay a [place]. Dr. reddy informed.stated he is coming to talk to her

## 2013-12-15 NOTE — Progress Notes (Signed)
Patient Name: Kim [name] Date of Encounter: 12/15/2013  Principal Problem:   Chest pain Active Problems:   COPD (chronic obstructive pulmonary disease)   Atrial fibrillation   Coronary atherosclerosis of native coronary artery   Chronic systolic CHF (congestive heart failure)   Pacemaker   Cirrhosis of liver due to hepatitis C   Shortness of breath   Depression with anxiety   Tobacco abuse   NSTEMI (non-ST elevated myocardial infarction)    SUBJECTIVE: C/o 6-8/10 [pain multiple areas. Chest pain is 8/10, chest wall is tender lower sternal area, pain worse with deep inspiration. Can't remember last time pain level was 0/10. Always in atrial fib.  OBJECTIVE Filed Vitals:   12/15/13 0620 12/15/13 0750 12/15/13 0811 12/15/13 0812  BP: 133/87 126/78    Pulse: 76 74  73  Temp:  97.5 F (36.4 C)    TempSrc:  Oral    Resp:  17  18  Height:      Weight:      SpO2:  98% 97% 97%    Intake/Output Summary (Last 24 hours) at 12/15/13 0839 Last data filed at 12/15/13 0820  Gross per 24 hour  Intake  68.67 ml  Output    925 ml  Net -856.33 ml   Filed Weights   12/14/13 1926 12/14/13 2214 12/15/13 0500  Weight: 230 lb (104.327 kg) 243 lb (110.224 kg) 232 lb 12.9 oz (105.6 kg)    PHYSICAL EXAM General: Well developed, well nourished, female in no acute distress. Head: Normocephalic, atraumatic.  Neck: Supple without bruits, JVD approx 8/10. Lungs:  Resp regular and unlabored, decreased BS bases, poor inspiratory effort, few rales. Heart: slightly irregular, S1, S2, no S3, S4, or murmur; no rub. Abdomen: Soft, non-tender, non-distended, BS + x 4.  Extremities: No clubbing, cyanosis, no edema.  Neuro: a little sleepy, rouses easily to verbal, oriented X 3. Moves all extremities spontaneously.  LABS: CBC: Recent Labs  12/14/13 2055  WBC 10.2  NEUTROABS 6.5  HGB 14.9  HCT 44.2  MCV 102.6*  PLT 196   INR: Recent Labs  12/15/13 0240  INR 1.10   Basic  Metabolic Panel: Recent Labs  12/14/13 2055 12/15/13 0240  NA 137 142  K 4.7 4.2  CL 98 106  CO2 27 24  GLUCOSE 99 116*  BUN 11 11  CREATININE 0.88 0.80  CALCIUM 9.0 8.4   Liver Function Tests: Recent Labs  12/14/13 2055 12/15/13 0240  AST 34 52*  ALT 29 35  ALKPHOS 62 66  BILITOT 1.1 1.6*  PROT 7.7 7.1  ALBUMIN 3.6 3.5   Cardiac Enzymes: Recent Labs  12/14/13 2055 12/15/13 0240  TROPONINI 0.47* 0.43*    Recent Labs  12/14/13 2012  TROPIPOC 0.10*   BNP: Pro B Natriuretic peptide (BNP)  Date/Time Value Range Status  12/14/2013  8:55 PM 1144.0* 0 - 125 pg/mL Final  07/16/2013  1:00 AM 1656.0* 0 - 125 pg/mL Final   Fasting Lipid Panel: Recent Labs  12/15/13 0240  CHOL 192  HDL 58  LDLCALC 114*  TRIG 99  CHOLHDL 3.3    TELE:  Atrial fib, rate controlled, occasional PVCs     Radiology/Studies: Dg Chest 2 View 12/14/2013   CLINICAL DATA:  Fever, cough, congestion.  EXAM: CHEST  2 VIEW  COMPARISON:  10/06/2013.  FINDINGS: Chronic cardiomegaly. Dual-chamber pacer from the left shows no interval displacement. Mitral annular calcification. No infiltrate or edema. No effusion or pneumothorax.  IMPRESSION: No active cardiopulmonary disease.   Electronically Signed   By: Tiburcio PeaJonathan  Watts M.D.   On: 12/14/2013 20:56     Current Medications:  . aspirin  81 mg Oral Daily  . atorvastatin  80 mg Oral QHS  . azithromycin  250 mg Oral Daily  . budesonide-formoterol  2 puff Inhalation BID  . furosemide  80 mg Oral Daily  . insulin aspart  0-5 Units Subcutaneous QHS  . insulin aspart  0-9 Units Subcutaneous TID WC  . ipratropium  0.5 mg Nebulization Q6H  . levalbuterol  0.63 mg Nebulization Q6H  . methylPREDNISolone (SOLU-MEDROL) injection  40 mg Intravenous Q8H  . metoprolol tartrate  25 mg Oral Q8H  . nystatin ointment   Topical TID  . pantoprazole  80 mg Oral Q1200  . potassium chloride  40 mEq Oral Daily  . QUEtiapine  50 mg Oral BID  . sertraline  50 mg Oral  Daily  . sodium chloride  3 mL Intravenous Q12H  . traZODone  100 mg Oral QHS  . warfarin  5 mg Oral ONCE-1800  . Warfarin - Pharmacist Dosing Inpatient   Does not apply q1800   . heparin 1,200 Units/hr (12/15/13 0819)    ASSESSMENT AND PLAN: 48 y.o. female w/ PMHx significant for COPD, CHF, NICM with EF 35-40% in 2013, non-obstructive CAD by cath 07/2012, permanent atrial fib/flutter, s/p Biotronik pacer, DM2, who [facility] with complaints of cough, feeling poorly. [facility] due to elevated troponin. Shortness of breath likely multifactorial including resolving PNA, active COPD, mild fluid overload, continued tobacco abuse.    Atrial fibrillation - rate is controlled, will get device interrogated    NSTEMI (non-ST elevated myocardial infarction) - do not believe primary vessel closure. Possibly supply-demand mismatch. Cardiac enzymes in 2013 (cathed then w/ non-obs dz) were elevated w/ peak Troponin 0.36, and peak CKMB 206/7.1. Echo ordered, f/u on this. MD advise on further eval.    Coronary atherosclerosis of native coronary artery - Cath 07/2012 w/ LAD 40%, small D2 60-70%, dominant RCA 20%, PL1 40%. Continue ASA/BB/statin    NICM - EF 35-40% in 2013, on BB. Can add ACE, but with hx Hep C and cirrhosis, LFTs mildly abnormal, will leave to MD.  Principal Problem:   Chest pain - pleuritic pain, Rx per primary MD  Otherwise, per primary MD. Active Problems:   COPD (chronic obstructive pulmonary disease)   Chronic systolic CHF (congestive heart failure) - got 1 dose Lasix 80 mg IV in ER, on oral Rx now. RF OK.   Pacemaker - Biotronik device, contacted them for interrogation.   Cirrhosis of liver due to hepatitis C   Shortness of breath   Depression with anxiety   Tobacco abuse    Signed, Theodore DemarkRhonda Barrett , PA-C 8:39 AM 12/15/2013 Patient states dyspnea is better. Still has some "constant" chest discomfort. Exam reveals mild  expiratory wheezing. Heart no gallop. Telemetry shows atrial fibrillation with paced ventricular rhythm. Echo is pending. If EF is still low would add low dose ACEi. I think the elevated troponins are secondary to supply/demand mismatch secondary to resolving pneumonia, atrial fib, CHF etc rather than primary vessel closure.

## 2013-12-15 NOTE — Progress Notes (Signed)
Echocardiogram 2D Echocardiogram with Definity has been performed.  Kim [name] 12/15/2013, 12:30 PM

## 2013-12-15 NOTE — Progress Notes (Signed)
ANTICOAGULATION CONSULT NOTE - Follow-up  Pharmacy Consult for lovenox Indication: atrial fibrillation, h/o PE  Allergies  Allergen Reactions  . Nitroglycerin Other (See Comments)    CAUSED NUMBNESS ALL OVER  . Doxycycline Itching  . Flexeril [Cyclobenzaprine] Other (See Comments)    Sweating, Lightheaded   . Ibuprofen Other (See Comments)    Increase BP, dizziness, lightheaded  . Tramadol Itching  . Tylenol [Acetaminophen] Other (See Comments)    Pt has Hep C  . Vesicare [Solifenacin] Other (See Comments)    Makes my sweat turn yellow  . Amoxil [Amoxicillin] Nausea Only   Patient Measurements: Height: 5\' 9"  (175.3 cm) Weight: 232 lb 12.9 oz (105.6 kg) IBW/kg (Calculated) : 66.2  Vital Signs: Temp: 97.3 F (36.3 C) (01/09 1148) Temp src: Axillary (01/09 1148) BP: 119/72 mmHg (01/09 1148) Pulse Rate: 79 (01/09 1148)  Labs:  Recent Labs  12/14/13 2055 12/15/13 0240 12/15/13 0820  HGB 14.9  --   --   HCT 44.2  --   --   PLT 196  --   --   LABPROT 14.0 14.0  --   INR 1.10 1.10  --   HEPARINUNFRC  --  <0.10*  --   CREATININE 0.88 0.80  --   TROPONINI 0.47* 0.43* <0.30    Estimated Creatinine Clearance: 110.1 ml/min (by C-G formula based on Cr of 0.8).  Assessment: 48 y/o initially started on IV heparin for anticoagulation, now transitioning to full dose lovenox. Baseline CBC is WNL and pt has adequate renal function for lovenox dosing.   Goal of Therapy:  Anti-Xa level 0.6-1 units/ml 4hrs after LMWH dose given Monitor platelets by anticoagulation protocol: Yes   Plan:  1. Lovenox 105mg  SQ Q12H 2. CBC Q72H while on lovenox  Lysle Pearl, PharmD, BCPS Pager # [phone] 12/15/2013 2:48 PM

## 2013-12-15 NOTE — Progress Notes (Signed)
Utilization Review Completed.  

## 2013-12-15 NOTE — Consult Note (Addendum)
CARDIOLOGY CONSULT NOTE  Patient ID: Kim [name], MRN: [id number], DOB/AGE: 10-Oct-1966 48 y.o. Admit date: 12/14/2013 Date of Consult: 12/15/2013  Primary Physician: Loyal Gambler Primary Cardiologist: Dr. Mayme Genta at [facility] Complaint: multiple complaints including cough Reason for Consultation: elevated troponin  HPI: 48 y.o. female w/ PMHx significant for COPD, CHF with reduced EF, atrial fib/flutter, s/p pacer, DM2 who presented initially to [facility] with complaints of cough and feeling poorly and was transferred to [facility] on 12/15/2013 due to elevated troponin.  Her recent history is significant for being admitted to [facility] with PNA and respiratory distress. She was treated with antibiotics and steroid taper discharged on 12/08/2013. Since then, she reports having a continued hacking cough which has led to chest discomfort. She also reports pain everywhere and desires pain medication (this is after receiving 8 morphine, 2 dilaudid, benzo at [facility]). Also with nausea. The pain in her chest is related to her coughing.  She also is reporting dysuria and diarrhea. Continues to smoke.  Her medication discharge from [facility] include metoprolol, atorvastatin, lasix 80, prednisone taper among others however, she only has a pill bottle for metoprolol. She states she was not given lasix at discharge. When off diuretics, she gets LE swelling and shortness of breath which she thinks is contributing to her symptoms. Noted that at her prior hospitalization at [facility], troponins were mildly abnormal. Also at last hospitalization here in 09/2013, troponins mildly abnormal.  At outside hospital, she was started on heparin gtt.  Last seen by Dr. Mayme Genta 1 yr ago she thinks. Pacer checked in [place], [place] in the last several months.  Past Medical History  Diagnosis Date  . Pacemaker     vent paced  . COPD (chronic obstructive pulmonary disease)   . CHF (congestive heart  failure)   . Thyroid disease   . Hepatitis     Hep C  . MI (myocardial infarction)   . Pneumonia   . Dysrhythmia     atrial fibrilation  . Cirrhosis   . Shortness of breath   . Diabetes mellitus   . Chest pain   . GERD (gastroesophageal reflux disease)   . Headache(784.0)   . Anxiety   . A-fib   . Chronic pain   . PE (pulmonary embolism)   . Hepatitis C       Surgical History:  Past Surgical History  Procedure Laterality Date  . Cholecystectomy    . Pacemaker insertion    . Insert / replace / remove pacemaker      02/2012     Home Meds: From [facility] discharge magnesium oxide (MAGNESIUM OXIDE) 400 TABS Take 400 mg by mouth 2 (two) times daily. metoprolol tartrate (LOPRESSOR) 50 mg tablet Take 25 mg by mouth 3 (three) times a day. OXYGEN Inhale 2-3 L into the lungs. tiotropium (SPIRIVA) 18 MCG inhalation capsule Place 1 capsule (18 mcg total) into inhaler and inhale daily.  30 capsule 5 09/08/2012 traZODone (DESYREL) 100 MG tablet Take 100 mg by mouth at bedtime. acetaminophen (TYLENOL) 650 MG TABS Take 1 tablet (650 mg total) by mouth every 4 (four) hours as needed.  30 tablet 0 12/08/2013 12/18/2013 atorvastatin (LIPITOR) 80 mg tablet Take 1 tablet (80 mg total) by mouth daily.  30 tablet 11 12/08/2013 12/08/2014 benzonatate (TESSALON) 100 mg capsule Take 1 capsule (100 mg total) by mouth 3 (three) times a day as needed for Cough.  20 capsule 0 12/08/2013 12/15/2013 budesonide-formoterol (SYMBICORT) 80-4.5  mcg/actuation inhaler Inhale 2 puffs into the lungs 2 (two) times daily. Per hospital d/c instructions  1 Inhaler 5 12/08/2013 furosemide (LASIX) 80 mg tablet Take 1 tablet (80 mg total) by mouth daily.  30 tablet 1 12/08/2013 06/06/2014 HYDROcodone-acetaminophen (NORCO) 5-325 mg per tablet Take 1 tablet by mouth every 4 (four) hours as needed.  30 tablet 0 12/08/2013 12/18/2013 predniSONE (DELTASONE) 10 mg tablet Take 6 pills day 1, then 5 pills day 2, then 4 pills day 3, then  3 pills day 4, then 2 pills day 5, then 1 pill day 6 then stop.  21 tablet 0 12/08/2013 QUEtiapine (SEROQUEL) 25 MG tablet Take 2 tablets (50 mg total) by mouth 2 (two) times daily.  60 tablet 0 12/08/2013 warfarin (COUMADIN) 4 MG tablet Take 1 tablet (4 mg total) by mouth daily.  30 tablet 0 12/08/2013 12/08/2014 cefdinir (OMNICEF) 300 MG capsule Take 1 capsule (300 mg total) by mouth every 12 (twelve) hours.   Inpatient Medications:  . aspirin  325 mg Oral Daily  . atorvastatin  80 mg Oral QHS  . azithromycin  250 mg Oral Daily  . budesonide-formoterol  2 puff Inhalation BID  . carvedilol  6.25 mg Oral BID WC  . furosemide  80 mg Oral BID  . insulin aspart  0-5 Units Subcutaneous QHS  . insulin aspart  0-9 Units Subcutaneous TID WC  . ipratropium  0.5 mg Nebulization Q6H  . levalbuterol  0.63 mg Nebulization Q6H  . methylPREDNISolone (SOLU-MEDROL) injection  40 mg Intravenous Q8H  . nystatin ointment   Topical TID  . pantoprazole  80 mg Oral Q1200  . potassium chloride  40 mEq Oral Daily  . QUEtiapine  50 mg Oral BID  . sertraline  50 mg Oral Daily  . sodium chloride  3 mL Intravenous Q12H  . traZODone  100 mg Oral QHS   . heparin 1,000 Units/hr (12/14/13 2232)    Allergies:  Allergies  Allergen Reactions  . Nitroglycerin Other (See Comments)    CAUSED NUMBNESS ALL OVER  . Doxycycline Itching  . Flexeril [Cyclobenzaprine] Other (See Comments)    Sweating, Lightheaded   . Ibuprofen Other (See Comments)    Increase BP, dizziness, lightheaded  . Tramadol Itching  . Tylenol [Acetaminophen] Other (See Comments)    Pt has Hep C  . Vesicare [Solifenacin] Other (See Comments)    Makes my sweat turn yellow  . Amoxil [Amoxicillin] Nausea Only    History   Social History  . Marital Status: Single    Spouse Name: N/A    Number of Children: N/A  . Years of Education: N/A   Occupational History  . Not on file.   Social History Main Topics  . Smoking status: Current  Every Day Smoker -- 0.50 packs/day for 30 years    Types: Cigarettes  . Smokeless tobacco: Current User    Types: Snuff  . Alcohol Use: No  . Drug Use: No  . Sexual Activity: No   Other Topics Concern  . Not on file   Social History Narrative   Contact information: [phone] (cell), [phone] (landline)     Family History  Problem Relation Age of Onset  . Coronary artery disease Father 4  . Coronary artery disease Mother 43  . Coronary artery disease Brother    Review of Systems: General: negative for chills, fever, night sweats or weight changes.  Cardiovascular: see HPI Dermatological: negative for rash Respiratory: see HPI Urologic: negative for hematuria, +  Dysuria Abdominal: negative for vomiting, diarrhea, bright red blood per rectum, melena, or hematemesis Neurologic: negative for visual changes, syncope, or dizziness All other systems reviewed and are otherwise negative except as noted above.  Labs:  Recent Labs  12/14/13 2055  TROPONINI 0.47*   Lab Results  Component Value Date   WBC 10.2 12/14/2013   HGB 14.9 12/14/2013   HCT 44.2 12/14/2013   MCV 102.6* 12/14/2013   PLT 196 12/14/2013    Recent Labs Lab 12/14/13 2055  NA 137  K 4.7  CL 98  CO2 27  BUN 11  CREATININE 0.88  CALCIUM 9.0  PROT 7.7  BILITOT 1.1  ALKPHOS 62  ALT 29  AST 34  GLUCOSE 99   Lab Results  Component Value Date   CHOL 115 07/20/2012   HDL 23* 07/20/2012   LDLCALC 71 07/20/2012   TRIG 105 07/20/2012   Lab Results  Component Value Date   DDIMER 0.29 07/15/2012    Radiology/Studies:  Dg Chest 2 View  12/14/2013   CLINICAL DATA:  Fever, cough, congestion.  EXAM: CHEST  2 VIEW  COMPARISON:  10/06/2013.  FINDINGS: Chronic cardiomegaly. Dual-chamber pacer from the left shows no interval displacement. Mitral annular calcification. No infiltrate or edema. No effusion or pneumothorax.  IMPRESSION: No active cardiopulmonary disease.   Electronically Signed   By: Tiburcio PeaJonathan  Watts  M.D.   On: 12/14/2013 20:56    EKG: likely underlying fib/flutter with v- paced rhythm  Physical Exam: Blood pressure 156/98, pulse 75, temperature 97.6 F (36.4 C), temperature source Oral, resp. rate 25, height 5\' 9"  (1.753 m), weight 110.224 kg (243 lb), last menstrual period 02/08/2007, SpO2 99.00%. General: Appears much older than stated age, NAD Head: Normocephalic, atraumatic, sclera non-icteric nares are without discharg Neck: Supple. . JVD not elevated. Lungs: diffuse wheezing Heart: RRR with S1 S2. No murmurs, rubs, or gallops appreciated. Chest discomfort reproducible with palpation Abdomen: Soft, non-tender, non-distended with normoactive bowel sounds. No hepatomegaly. No rebound/guarding. No obvious abdominal masses. Msk:  Strength and tone appear normal for age. Extremities: warm and dry, +1 edema Neuro: Alert and oriented X 3. Moves all extremities spontaneously. Psych:  Responds to questions appropriately with a normal affect.   Problem List 1. Shortness of breath, likely multifactorial including COPD, recent PNA, mild fluid overload 2. Congestive heart failure with mildly reduced EF by MPI 07/2012, acute on chronic symptoms 3. Medication noncompliance/confusion 4. Atrial fibrillation 5. Elevated troponin, likely demand/supply mismatch 6. Nonobstructive CAD by cath 2013 7. H/o PE 8. Hep C 9. S/p pacer 10. Chronic pain/drug seek behavior 11. COPD, active excerbation 12. Hypertension, not currently controlled  Assessment and Plan:  48 y.o. female w/ PMHx significant for COPD, CHF with reduced EF, atrial fib/flutter, s/p pacer, DM2 who [facility] with complaints of cough and feeling poorly and was [facility]2013 due to elevated troponin. Shortness of breath likely multifactorial including resolving PNA, active COPD, mild fluid overload, continue tobacco abuse.  Regarding the elevated troponin, her presentation is  not consistent with acute coronary syndrome as her chest pain is reproducible. EKG not helpful as it is V-paced. Also noted that she has had chronic low level troponin elevation. More likely that her troponin is related to supply demand mismatch given slight fluid overload, recent illness, pulmonary disease etc. However, given her risk factors and known nonobstructive CAD, would continue to reduce her risk with statin, beta blocker, smoking cessation, and aspirin  81 mg. Would continue heparin to bridge to coumadin (afib, PE).  She does appear to be slightly fluid overloaded by history and by BNP. This is likely due to steroids for PNA as well as noncompliance with lasix. Will jump start diuresis with lasix and continue oral regimen.  Appears to be in afib. V paced. Pt to follow up with Dr. Mayme Genta with [facility].  Primary team to investigate dysuria and diarrhea.   Summary of recs: -continue ASA, statin, beta blocker (took the liberty of fixing her carvedilol to metoprolol) -will write for lasix 80 IV x 1 and then continue lasix 80 qday -continue heparin to bridge to coumadin  Thank you for this consult. Will follow with you. Please call with questions.  Signed, Adolm Joseph, Eleanor Dimichele C. MD 12/15/2013, 2:42 AM

## 2013-12-15 NOTE — Progress Notes (Signed)
[name] Progress Note [place] TEAM 1 - Stepdown/ICU TEAM   Kim [name] RFV:[id number] DOB: 06/24/1966 DOA: 12/14/2013 PCP: Provider Not In System  Admit HPI / Brief Narrative: 48 y.o. F with history of systolic and diastolic congestive heart failure with EF of 35-40% based on a 2-D echocardiogram in August of 2013, cirrhosis, diabetes, GERD, A. Fib on anti-coagulation with noncompliance with Coumadin at times, hx of pulmonary embolism, hepatitis C, anxiety, hypothyroidism, and COPD who presented with chest pain and shortness of breath. Patient was hospitalized at [facility] 12/03/2013 - 12/08/2013 for COPD exacerbation. After discharge, patient reported that she had been persistently short of breath with substernal chest pain. She presented to [facility] for further evaluation.   In the emergency department patient was found to have positive troponin of 0.47. Given patient's multiple comorbidities, patient was transferred to [facility] for further management.   HPI/Subjective: Pt is sitting up in bed enjoying her lunch. She is jaundiced.  She c/o sob, but appears comfortable.  She denies cp at this time.   Assessment/Plan:  CP w/ mild troponin elevation Troponin has rapidly normalized - no further eval indicated per Cardiology   COPD Modest exacerbation which appears to be slowly improving - cont maximal med tx and follow serial exam   Systolic CHF w/ mild acute exacerbation Negative ~1358ml since admit - cont to diurese - follow wgts/ Is & Os - f/u TTE pending   Afib on warfarin  INR normal, indicating noncompliance - Pharmacy dosing - rate is controlled  Macrocytosis Check B12 and folic acid levels  DM A1c 6.4 - follow CBGs  GERD On PPI   Hx of PE Supposed to be on chronic warfarin tx - full dose lovenox until INR at goal  Hepatitis C Tbil mildly elevated at 1.6, but LFTs essentially normal and coags normal - oupt f/u   THC + UDS Counseled  on absolute need to stop smoking all substances   MRSA screen + Usual contact precautions  Code Status: FULL Family Communication: no family present at time of exam Disposition Plan: transfer to med/surg bed   Consultants: Cardiology   Procedures: TTE - 1/9 - pending  Antibiotics: Azithromycin 1/08 >>  DVT prophylaxis: lovenox >> warfarin   Objective: Blood pressure 119/72, pulse 79, temperature 97.3 F (36.3 C), temperature source Axillary, resp. rate 23, height 5\' 9"  (1.753 m), weight 105.6 kg (232 lb 12.9 oz), last menstrual period 02/08/2007, SpO2 94.00%.  Intake/Output Summary (Last 24 hours) at 12/15/13 1325 Last data filed at 12/15/13 1300  Gross per 24 hour  Intake 136.67 ml  Output   1425 ml  Net -1288.33 ml   Exam: General: mild acute respiratory distress Lungs: poor air movement th/o all fields - mild exp wheezing diffusely  Cardiovascular: distant HS - no gallup or rub  Abdomen: obese, nontender, nondistended, soft, bowel sounds positive, no rebound, no ascites, no appreciable mass Extremities: No significant cyanosis, clubbing;  1+ edema bilateral lower extremities  Data Reviewed: Basic Metabolic Panel:  Recent Labs Lab 12/14/13 2055 12/15/13 0240  NA 137 142  K 4.7 4.2  CL 98 106  CO2 27 24  GLUCOSE 99 116*  BUN 11 11  CREATININE 0.88 0.80  CALCIUM 9.0 8.4   Liver Function Tests:  Recent Labs Lab 12/14/13 2055 12/15/13 0240  AST 34 52*  ALT 29 35  ALKPHOS 62 66  BILITOT 1.1 1.6*  PROT 7.7 7.1  ALBUMIN 3.6 3.5  CBC:  Recent Labs Lab 12/14/13 2055  WBC 10.2  NEUTROABS 6.5  HGB 14.9  HCT 44.2  MCV 102.6*  PLT 196   Cardiac Enzymes:  Recent Labs Lab 12/14/13 2055 12/15/13 0240 12/15/13 0820  TROPONINI 0.47* 0.43* <0.30   BNP (last 3 results)  Recent Labs  03/05/13 1725 07/16/13 0100 12/14/13 2055  PROBNP 1327.0* 1656.0* 1144.0*   CBG:  Recent Labs Lab 12/15/13 0104 12/15/13 0753 12/15/13 1153  GLUCAP  121* 159* 209*    Recent Results (from the past 240 hour(s))  MRSA PCR SCREENING     Status: Abnormal   Collection Time    12/15/13  1:49 AM      Result Value Range Status   MRSA by PCR POSITIVE (*) NEGATIVE Final   Comment:            The GeneXpert MRSA Assay (FDA     approved for NASAL specimens     only), is one component of a     comprehensive MRSA colonization     surveillance program. It is not     intended to diagnose MRSA     infection nor to guide or     monitor treatment for     MRSA infections.     RESULT CALLED TO, READ BACK BY AND VERIFIED WITH:     CALLED TO RN TINA SAVAGE [phone] @0655  THANEY     Studies:  Recent x-ray studies have been reviewed in detail by the Attending Physician  Scheduled Meds:  Scheduled Meds: . aspirin  81 mg Oral Daily  . atorvastatin  80 mg Oral QHS  . azithromycin  250 mg Oral Daily  . budesonide-formoterol  2 puff Inhalation BID  . furosemide  80 mg Oral Daily  . insulin aspart  0-5 Units Subcutaneous QHS  . insulin aspart  0-9 Units Subcutaneous TID WC  . ipratropium  0.5 mg Nebulization Q6H  . levalbuterol  0.63 mg Nebulization Q6H  . methylPREDNISolone (SOLU-MEDROL) injection  40 mg Intravenous Q8H  . metoprolol tartrate  25 mg Oral Q8H  . nystatin ointment   Topical TID  . pantoprazole  80 mg Oral Q1200  . potassium chloride  40 mEq Oral Daily  . QUEtiapine  50 mg Oral BID  . sertraline  50 mg Oral Daily  . sodium chloride  3 mL Intravenous Q12H  . traZODone  100 mg Oral QHS  . warfarin  5 mg Oral ONCE-1800  . Warfarin - Pharmacist Dosing Inpatient   Does not apply q1800    Time spent on care of this patient: 35 mins   [facility],Bebe Moncure T  [name] Office  [phone] Pager - Text Page per Loretha StaplerAmion as per below:  On-Call/Text Page:      [email or web address]      password [email or web address]  If 7PM-7AM, please contact night-coverage [email or web address] Password [email or web address] 12/15/2013, 1:25 PM   LOS: 1 day

## 2013-12-15 NOTE — Progress Notes (Signed)
[facility] progress note. Chief complaint. Transfer note. History of present illness.  This 48 year old female presented [facility] hospital with chest pain and shortness of breath. Patient felt to have chest pain/non-ST related MI. On-call cardiology aware of patient and will see once transferred to [facility]. The patient has now arrived. Patient also felt to have an exacerbation of COPD initiated on azithromycin and Solu-Medrol. She has a history of atrial fib with prior pacemaker and congestive heart failure with EF 35-40%. The patient is now arrived in transfer to [facility]. I'm seeing the patient to ensure she remains clinically stable and that all orders transferred appropriately. Patient currently has complaints of mild chest pain without radiation, diaphoresis, or nausea. Vital signs. Temperature 97.9, pulse 75, respiration 25, blood pressure 156/98. O2 sats 99%. General appearance. Obese middle-aged female who is alert, cooperative, and in no distress. Cardiac. Rate and rhythm regular. Lungs. Reduced in the bases. Some scattered rhonchi bilaterally. Upper airway wheezing evident on expiration. Abdomen. Soft and obese with positive bowel sounds. No pain with palpation. Impression/plan. Problem #1 chest pain with probable non-ST elevated MI. Patient on heparin drip. Also on aspirin. Obese for a 2-D echocardiogram in the morning. I will notify cardiology of patient's arrival. Problem #2. COPD exacerbation. On azithromycin antibiotic therapy and Solu-Medrol. Problem #3 atrial fib. Rate control. We'll continue her home Coumadin along with current heparin drip. Patient appears clinically stable post transfer. All orders appear to of transferred appropriately.

## 2013-12-15 NOTE — Telephone Encounter (Signed)
Opened in error

## 2013-12-15 NOTE — Progress Notes (Signed)
ANTICOAGULATION CONSULT NOTE - Initial Consult  Pharmacy Consult for Warfarin/Heparin  Indication: atrial fibrillation, h/o PE  Allergies  Allergen Reactions  . Nitroglycerin Other (See Comments)    CAUSED NUMBNESS ALL OVER  . Doxycycline Itching  . Flexeril [Cyclobenzaprine] Other (See Comments)    Sweating, Lightheaded   . Ibuprofen Other (See Comments)    Increase BP, dizziness, lightheaded  . Tramadol Itching  . Tylenol [Acetaminophen] Other (See Comments)    Pt has Hep C  . Vesicare [Solifenacin] Other (See Comments)    Makes my sweat turn yellow  . Amoxil [Amoxicillin] Nausea Only   Patient Measurements: Height: 5\' 9"  (175.3 cm) Weight: 232 lb 12.9 oz (105.6 kg) IBW/kg (Calculated) : 66.2  Vital Signs: Temp: 97.6 F (36.4 C) (01/09 0155) Temp src: Oral (01/09 0155) BP: 133/87 mmHg (01/09 0620) Pulse Rate: 76 (01/09 0620)  Labs:  Recent Labs  12/14/13 2055 12/15/13 0240  HGB 14.9  --   HCT 44.2  --   PLT 196  --   LABPROT 14.0 14.0  INR 1.10 1.10  CREATININE 0.88 0.80  TROPONINI 0.47* 0.43*    Estimated Creatinine Clearance: 110.1 ml/min (by C-G formula based on Cr of 0.8).   Medical History: Past Medical History  Diagnosis Date  . Pacemaker     vent paced  . COPD (chronic obstructive pulmonary disease)   . CHF (congestive heart failure)   . Thyroid disease   . Hepatitis     Hep C  . MI (myocardial infarction)   . Pneumonia   . Dysrhythmia     atrial fibrilation  . Cirrhosis   . Shortness of breath   . Diabetes mellitus   . Chest pain   . GERD (gastroesophageal reflux disease)   . Headache(784.0)   . Anxiety   . A-fib   . Chronic pain   . PE (pulmonary embolism)   . Hepatitis C     Assessment: 48 y/o tx from [facility] with elevated troponin. No plans for cath. On warfarin PTA for h/o PE, now in afib. To bridge with heparin until INR is >2 for PE/afib per MD notes. INR on admit was 1.10. Warfarin PTA dose was 4mg /day (?compliance). Heparin  was running at 1000 units/hr from [facility] and HL this AM in undetectable (lab wasn't released until ~0800).   Goal of Therapy:  INR 2-3 Heparin level 0.3-0.7 units/ml Monitor platelets by anticoagulation protocol: Yes   Plan:  -Warfarin 5mg  PO x 1 tonight at 1800 -Daily PT/INR -Monitor for bleeding -Heparin 2000 units BOLUS x 1 -Increase heparin drip to 1200 units/hr -6 hour HL at 1430  Thank you for allowing me to take part in this patient's care,  Abran Duke, PharmD Clinical Pharmacist Phone: [phone] Pager: [phone] 12/15/2013 7:59 AM

## 2013-12-16 ENCOUNTER — Inpatient Hospital Stay (HOSPITAL_COMMUNITY): Payer: Medicaid Other

## 2013-12-16 LAB — CBC
HCT: 38.8 % (ref 36.0–46.0)
Hemoglobin: 13.5 g/dL (ref 12.0–15.0)
MCH: 34.7 pg — AB (ref 26.0–34.0)
MCHC: 34.8 g/dL (ref 30.0–36.0)
MCV: 99.7 fL (ref 78.0–100.0)
PLATELETS: 180 10*3/uL (ref 150–400)
RBC: 3.89 MIL/uL (ref 3.87–5.11)
RDW: 14.7 % (ref 11.5–15.5)
WBC: 16.9 10*3/uL — ABNORMAL HIGH (ref 4.0–10.5)

## 2013-12-16 LAB — BASIC METABOLIC PANEL
BUN: 18 mg/dL (ref 6–23)
CALCIUM: 8.3 mg/dL — AB (ref 8.4–10.5)
CO2: 24 mEq/L (ref 19–32)
CREATININE: 0.93 mg/dL (ref 0.50–1.10)
Chloride: 100 mEq/L (ref 96–112)
GFR calc non Af Amer: 71 mL/min — ABNORMAL LOW (ref >90)
GFR, EST AFRICAN AMERICAN: 82 mL/min — AB (ref >90)
GLUCOSE: 201 mg/dL — AB (ref 70–99)
POTASSIUM: 4.1 meq/L (ref 3.7–5.3)
Sodium: 141 mEq/L (ref 137–147)

## 2013-12-16 LAB — GLUCOSE, CAPILLARY
GLUCOSE-CAPILLARY: 235 mg/dL — AB (ref 70–99)
GLUCOSE-CAPILLARY: 243 mg/dL — AB (ref 70–99)
Glucose-Capillary: 187 mg/dL — ABNORMAL HIGH (ref 70–99)
Glucose-Capillary: 328 mg/dL — ABNORMAL HIGH (ref 70–99)

## 2013-12-16 LAB — PROTIME-INR
INR: 0.97 (ref 0.00–1.49)
PROTHROMBIN TIME: 12.7 s (ref 11.6–15.2)

## 2013-12-16 LAB — FOLATE: Folate: 18.2 ng/mL

## 2013-12-16 LAB — VITAMIN B12: Vitamin B-12: 386 pg/mL (ref 211–911)

## 2013-12-16 MED ORDER — ENOXAPARIN SODIUM 120 MG/0.8ML ~~LOC~~ SOLN
105.0000 mg | Freq: Two times a day (BID) | SUBCUTANEOUS | Status: DC
  Administered 2013-12-16 – 2013-12-17 (×3): 105 mg via SUBCUTANEOUS
  Filled 2013-12-16 (×4): qty 0.8

## 2013-12-16 MED ORDER — LISINOPRIL 2.5 MG PO TABS
2.5000 mg | ORAL_TABLET | Freq: Every day | ORAL | Status: DC
  Administered 2013-12-16 – 2013-12-17 (×2): 2.5 mg via ORAL
  Filled 2013-12-16 (×2): qty 1

## 2013-12-16 MED ORDER — MUPIROCIN 2 % EX OINT
1.0000 "application " | TOPICAL_OINTMENT | Freq: Two times a day (BID) | CUTANEOUS | Status: DC
  Administered 2013-12-16 – 2013-12-17 (×3): 1 via NASAL
  Filled 2013-12-16: qty 22

## 2013-12-16 MED ORDER — CARVEDILOL 3.125 MG PO TABS
3.1250 mg | ORAL_TABLET | Freq: Two times a day (BID) | ORAL | Status: DC
  Administered 2013-12-16 – 2013-12-17 (×2): 3.125 mg via ORAL
  Filled 2013-12-16 (×4): qty 1

## 2013-12-16 MED ORDER — PREDNISONE 20 MG PO TABS
40.0000 mg | ORAL_TABLET | Freq: Every day | ORAL | Status: DC
  Administered 2013-12-17: 40 mg via ORAL
  Filled 2013-12-16 (×2): qty 2

## 2013-12-16 MED ORDER — WARFARIN SODIUM 4 MG PO TABS: 4.0000 mg | ORAL_TABLET | Freq: Every day | ORAL | Status: DC

## 2013-12-16 MED ORDER — CHLORHEXIDINE GLUCONATE CLOTH 2 % EX PADS
6.0000 | MEDICATED_PAD | Freq: Every day | CUTANEOUS | Status: DC
  Administered 2013-12-16 – 2013-12-17 (×2): 6 via TOPICAL

## 2013-12-16 MED ORDER — PREDNISONE 50 MG PO TABS
50.0000 mg | ORAL_TABLET | Freq: Every day | ORAL | Status: DC
  Filled 2013-12-16: qty 1

## 2013-12-16 MED ORDER — GI COCKTAIL ~~LOC~~
30.0000 mL | Freq: Once | ORAL | Status: AC
  Administered 2013-12-16: 30 mL via ORAL
  Filled 2013-12-16: qty 30

## 2013-12-16 MED ORDER — WARFARIN SODIUM 5 MG PO TABS
5.0000 mg | ORAL_TABLET | Freq: Once | ORAL | Status: AC
  Administered 2013-12-16: 5 mg via ORAL
  Filled 2013-12-16: qty 1

## 2013-12-16 NOTE — Progress Notes (Signed)
Pt states that she can't take Xarelto due to a reaction of nausea/vomiting when previously on the medication. Pt stated she will refuse this medication. Notified Dr. Thedore Mins of pt's report. Dr. Thedore Mins stated pt will go home on Coumadin and Lovenox.Dr. Thedore Mins gave orders. Case Management was paged about the change.

## 2013-12-16 NOTE — Progress Notes (Signed)
ANTICOAGULATION CONSULT NOTE - Follow Up Consult  Pharmacy Consult for Lovenox and Coumadin Indication: atrial fibrillation  Allergies  Allergen Reactions  . Nitroglycerin Other (See Comments)    CAUSED NUMBNESS ALL OVER  . Doxycycline Itching  . Flexeril [Cyclobenzaprine] Other (See Comments)    Sweating, Lightheaded   . Ibuprofen Other (See Comments)    Increase BP, dizziness, lightheaded  . Tramadol Itching  . Tylenol [Acetaminophen] Other (See Comments)    Pt has Hep C  . Vesicare [Solifenacin] Other (See Comments)    Makes my sweat turn yellow  . Amoxil [Amoxicillin] Nausea Only    Patient Measurements: Height: 5\' 9"  (175.3 cm) Weight: 231 lb 14.8 oz (105.2 kg) IBW/kg (Calculated) : 66.2 Heparin Dosing Weight:   Vital Signs: Temp: 97.5 F (36.4 C) (01/10 0553) Temp src: Oral (01/10 0553) BP: 127/77 mmHg (01/10 0553) Pulse Rate: 85 (01/10 0553)  Labs:  Recent Labs  12/14/13 2055 12/15/13 0240 12/15/13 0820 12/16/13 0420  HGB 14.9  --   --  13.5  HCT 44.2  --   --  38.8  PLT 196  --   --  180  LABPROT 14.0 14.0  --  12.7  INR 1.10 1.10  --  0.97  HEPARINUNFRC  --  <0.10*  --   --   CREATININE 0.88 0.80  --  0.93  TROPONINI 0.47* 0.43* <0.30  --     Estimated Creatinine Clearance: 94.5 ml/min (by C-G formula based on Cr of 0.93).   Medications:  Scheduled:  . aspirin  81 mg Oral Daily  . atorvastatin  80 mg Oral QHS  . azithromycin  250 mg Oral Daily  . budesonide (PULMICORT) nebulizer solution  0.25 mg Nebulization BID  . carvedilol  3.125 mg Oral BID WC  . Chlorhexidine Gluconate Cloth  6 each Topical Q0600  . enoxaparin (LOVENOX) injection  105 mg Subcutaneous Q12H  . furosemide  80 mg Oral BID  . insulin aspart  0-5 Units Subcutaneous QHS  . insulin aspart  0-9 Units Subcutaneous TID WC  . ipratropium  0.5 mg Nebulization BID  . levalbuterol  0.63 mg Nebulization BID  . lisinopril  2.5 mg Oral Daily  . mupirocin ointment  1 application  Nasal BID  . nystatin ointment   Topical TID  . pantoprazole  80 mg Oral Q1200  . potassium chloride  40 mEq Oral Daily  . [START ON 12/17/2013] predniSONE  40 mg Oral Q breakfast  . QUEtiapine  50 mg Oral BID  . sertraline  50 mg Oral Daily  . sodium chloride  3 mL Intravenous Q12H  . traZODone  100 mg Oral QHS  . warfarin  4 mg Oral q1800  . Warfarin - Pharmacist Dosing Inpatient   Does not apply q1800    Assessment: 48yo female with AFib, to remain on Lovenox and Coumadin as she has previously not tolerated Xarelto and does not wish to try it again.  No Xarelto was ordered.  INR 0.97, Hg 13.5 and Pltc 180.  No bleeding problems noted.  Goal of Therapy:  INR 2-3 Anti-Xa level 0.6-1 units/ml 4hrs after LMWH dose given Monitor platelets by anticoagulation protocol: Yes   Plan:  1-  Continue Lovenox 105mg  SQ q12, will adjust to more pt friendly schedule 2-  Coumadin 5mg  today 3-  Daily INR  Marisue Humble, PharmD Clinical Pharmacist [facility]

## 2013-12-16 NOTE — Progress Notes (Signed)
[facility]                                                                                Patient Demographics  Kim [name], is a 48 y.o. female, DOB - [birth year], ZOX:[id number]  Admit date - 12/14/2013   Admitting Physician Cristal FordSrikar A Reddy, MD  Outpatient Primary MD for the patient is Provider Not In System  LOS - 2   Chief Complaint  Patient presents with  . Cough       Brief Narrative:   48 y.o. F with history of systolic and diastolic congestive heart failure with EF of 35-40% based on a 2-D echocardiogram in August of 2013, cirrhosis, diabetes, GERD, A. Fib on anti-coagulation with noncompliance with Coumadin at times, hx of pulmonary embolism, hepatitis C, anxiety, hypothyroidism, and COPD who presented with chest pain and shortness of breath. Patient was [facility] 12/03/2013 - 12/08/2013 for COPD exacerbation. After discharge, patient reported that she had been persistently short of breath with substernal chest pain. She presented to [facility] for further evaluation.  In the emergency department patient was found to have positive troponin of 0.47. Given patient's multiple comorbidities, patient was transferred to [facility] for further management.       Assessment & Plan     COPD exacerbation   She is much improved after IV steroids, wheezing is minimal, will change to oral steroids, continue azithromycin, repeat chest x-ray, oxygen and nebulizer treatments as needed. Increase activity likely discharge in the morning.    Atypical chest pain CP w/ mild troponin elevation, troponin kinetics not consistent with ACS  Troponin has rapidly normalized - no further eval indicated per Cardiology, per cardiology this is not NSTEMI, continue aspirin along with beta blocker and statin for secondary prevention. Per cardiology outpatient follow with cardiology for further testing if needed.      Systolic CHF w/ mild acute exacerbation  A  close to compensated, continue salt and fluid restriction, have switched her to Coreg and on low-dose ACE inhibitor, on it her closely    Afib was on Coumadin with poor INR control.   Will switch to xaralto. Case management to assist with medication needs if any.    Macrocytosis  Check B12 and folic acid levels     Prediabetic A1c 6.4 -steroid-induced hyperglycemia number continue on sliding scale, tapering steroids for now  CBG (last 3)   Recent Labs  12/15/13 1841 12/15/13 2136 12/16/13 0757  GLUCAP 214* 258* 187*      GERD  On PPI    Hx of PE  Was on Coumadin with poor INR control switch to xaralto and monitor    Hepatitis C  Tbil mildly elevated at 1.6, but LFTs essentially normal and coags normal -  Outpatient GI followup post discharge    THC + UDS  Counseled on absolute need to stop smoking all substances     MRSA screen +  Usual contact precautions     Code Status:   Family Communication:   Disposition Plan: Home   Procedures echo gram  - Normal LV size with mild LV hypertrophy. EF 40-45% with septal-lateral dyssynchrony (RV pacing)  and global hypokinesis. Normal RV size with mild to moderately decreased systolic function      Consults  cardiology   Medications  Scheduled Meds: . aspirin  81 mg Oral Daily  . atorvastatin  80 mg Oral QHS  . azithromycin  250 mg Oral Daily  . budesonide (PULMICORT) nebulizer solution  0.25 mg Nebulization BID  . carvedilol  3.125 mg Oral BID WC  . Chlorhexidine Gluconate Cloth  6 each Topical Q0600  . enoxaparin (LOVENOX) injection  105 mg Subcutaneous Q12H  . furosemide  80 mg Oral BID  . insulin aspart  0-5 Units Subcutaneous QHS  . insulin aspart  0-9 Units Subcutaneous TID WC  . ipratropium  0.5 mg Nebulization BID  . levalbuterol  0.63 mg Nebulization BID  . lisinopril  2.5 mg Oral Daily  . mupirocin ointment  1 application Nasal BID  . nystatin ointment   Topical TID  .  pantoprazole  80 mg Oral Q1200  . potassium chloride  40 mEq Oral Daily  . [START ON 12/17/2013] predniSONE  50 mg Oral Q breakfast  . QUEtiapine  50 mg Oral BID  . sertraline  50 mg Oral Daily  . sodium chloride  3 mL Intravenous Q12H  . traZODone  100 mg Oral QHS  . Warfarin - Pharmacist Dosing Inpatient   Does not apply q1800   Continuous Infusions:  PRN Meds:.benzonatate, guaiFENesin-dextromethorphan, HYDROcodone-acetaminophen, levalbuterol, ondansetron (ZOFRAN) IV, ondansetron  DVT Prophylaxis will place her on xaralto  Lab Results  Component Value Date   PLT 180 12/16/2013    Antibiotics     Anti-infectives   Start     Dose/Rate Route Frequency Ordered Stop   12/15/13 0300  azithromycin (ZITHROMAX) tablet 250 mg     250 mg Oral Daily 12/15/13 0207 12/20/13 0959          Subjective:   Ica Daye today has, No headache, No chest pain,mild heart burn,  No abdominal pain - No Nausea, No new weakness tingling or numbness, No Cough - SOB.    Objective:   Filed Vitals:   12/15/13 2135 12/16/13 0300 12/16/13 0553 12/16/13 0815  BP: 140/90  127/77   Pulse: 80  85   Temp: 97.6 F (36.4 C)  97.5 F (36.4 C)   TempSrc: Oral  Oral   Resp: 20  18   Height:      Weight:  105.2 kg (231 lb 14.8 oz)    SpO2: 99%  95% 98%    Wt Readings from Last 3 Encounters:  12/16/13 105.2 kg (231 lb 14.8 oz)  10/11/13 101.8 kg (224 lb 6.9 oz)  08/23/13 95.255 kg (210 lb)     Intake/Output Summary (Last 24 hours) at 12/16/13 1106 Last data filed at 12/15/13 1623  Gross per 24 hour  Intake     12 ml  Output   1000 ml  Net   -988 ml    Exam Awake Alert, Oriented X 3, No new F.N deficits, Normal affect [place].AT,PERRAL Supple Neck,No JVD, No cervical lymphadenopathy appriciated.  Symmetrical Chest wall movement, Good air movement bilaterally, minimal wheezing RRR,No Gallops,Rubs or new Murmurs, No Parasternal Heave +ve B.Sounds, Abd Soft, Non tender, No organomegaly appriciated,  No rebound - guarding or rigidity. No Cyanosis, Clubbing or edema, No new Rash or bruise     Data Review   Micro Results Recent Results (from the past 240 hour(s))  MRSA PCR SCREENING     Status: Abnormal   Collection  Time    12/15/13  1:49 AM      Result Value Range Status   MRSA by PCR POSITIVE (*) NEGATIVE Final   Comment:            The GeneXpert MRSA Assay (FDA     approved for NASAL specimens     only), is one component of a     comprehensive MRSA colonization     surveillance program. It is not     intended to diagnose MRSA     infection nor to guide or     monitor treatment for     MRSA infections.     RESULT CALLED TO, READ BACK BY AND VERIFIED WITH:     CALLED TO RN TINA SAVAGE [id number] @[phone]  [facility]    Radiology Reports Dg Chest 2 View  12/14/2013   CLINICAL DATA:  Fever, cough, congestion.  EXAM: CHEST  2 VIEW  COMPARISON:  10/06/2013.  FINDINGS: Chronic cardiomegaly. Dual-chamber pacer from the left shows no interval displacement. Mitral annular calcification. No infiltrate or edema. No effusion or pneumothorax.  IMPRESSION: No active cardiopulmonary disease.   Electronically Signed   By: Tiburcio Pea M.D.   On: 12/14/2013 20:56    CBC  Recent Labs Lab 12/14/13 2055 12/16/13 0420  WBC 10.2 16.9*  HGB 14.9 13.5  HCT 44.2 38.8  PLT 196 180  MCV 102.6* 99.7  MCH 34.6* 34.7*  MCHC 33.7 34.8  RDW 15.2 14.7  LYMPHSABS 2.4  --   MONOABS 1.2*  --   EOSABS 0.1  --   BASOSABS 0.0  --     Chemistries   Recent Labs Lab 12/14/13 2055 12/15/13 0240 12/16/13 0420  NA 137 142 141  K 4.7 4.2 4.1  CL 98 106 100  CO2 27 24 24   GLUCOSE 99 116* 201*  BUN 11 11 18   CREATININE 0.88 0.80 0.93  CALCIUM 9.0 8.4 8.3*  AST 34 52*  --   ALT 29 35  --   ALKPHOS 62 66  --   BILITOT 1.1 1.6*  --    ------------------------------------------------------------------------------------------------------------------ estimated creatinine clearance is 94.5 ml/min (by C-G  formula based on Cr of 0.93). ------------------------------------------------------------------------------------------------------------------  Recent Labs  12/15/13 0240  HGBA1C 6.4*   ------------------------------------------------------------------------------------------------------------------  Recent Labs  12/15/13 0240  CHOL 192  HDL 58  LDLCALC 114*  TRIG 99  CHOLHDL 3.3   ------------------------------------------------------------------------------------------------------------------ No results found for this basename: TSH, T4TOTAL, FREET3, T3FREE, THYROIDAB,  in the last 72 hours ------------------------------------------------------------------------------------------------------------------ No results found for this basename: VITAMINB12, FOLATE, FERRITIN, TIBC, IRON, RETICCTPCT,  in the last 72 hours  Coagulation profile  Recent Labs Lab 12/14/13 2055 12/15/13 0240 12/16/13 0420  INR 1.10 1.10 0.97    No results found for this basename: DDIMER,  in the last 72 hours  Cardiac Enzymes  Recent Labs Lab 12/14/13 2055 12/15/13 0240 12/15/13 0820  TROPONINI 0.47* 0.43* <0.30   ------------------------------------------------------------------------------------------------------------------ No components found with this basename: POCBNP,      Time Spent in minutes   45   Shadasia Oldfield K M.D on 12/16/2013 at 11:06 AM  Between 7am to 7pm - Pager - [phone]  After 7pm go to [email or web address] - password [email or web address]  And look for the night coverage person covering for me after hours  [facility] Office  [phone]

## 2013-12-16 NOTE — Progress Notes (Signed)
Patient ID: Kim DavenportSandra F [name], female   DOB: Jul 21, 1966, 48 y.o.   MRN: [id number]    Subjective:   Patient still with some SOB this morning. Mild chest pain that is reproducible.   Objective:   Temp:  [97.3 F (36.3 C)-97.6 F (36.4 C)] 97.5 F (36.4 C) (01/10 0553) Pulse Rate:  [78-88] 85 (01/10 0553) Resp:  [18-25] 18 (01/10 0553) BP: (118-140)/(72-90) 127/77 mmHg (01/10 0553) SpO2:  [94 %-99 %] 98 % (01/10 0815) Weight:  [231 lb 14.8 oz (105.2 kg)] 231 lb 14.8 oz (105.2 kg) (01/10 0300) Last BM Date: 12/15/13  Filed Weights   12/14/13 2214 12/15/13 0500 12/16/13 0300  Weight: 243 lb (110.224 kg) 232 lb 12.9 oz (105.6 kg) 231 lb 14.8 oz (105.2 kg)    Intake/Output Summary (Last 24 hours) at 12/16/13 0951 Last data filed at 12/15/13 1623  Gross per 24 hour  Intake     36 ml  Output   1000 ml  Net   -964 ml    Exam:  General:NAD  Resp:CTAB  Cardiac:irreg, no m/r/g, no JVD  WU:JWJXBJYGI:abdomen soft, NT, ND  NWG:[id number]:extremities are warm, no edema  Neuro: no focal deficits   Lab Results:  Basic Metabolic Panel:  Recent Labs Lab 12/14/13 2055 12/15/13 0240 12/16/13 0420  NA 137 142 141  K 4.7 4.2 4.1  CL 98 106 100  CO2 27 24 24   GLUCOSE 99 116* 201*  BUN 11 11 18   CREATININE 0.88 0.80 0.93  CALCIUM 9.0 8.4 8.3*    Liver Function Tests:  Recent Labs Lab 12/14/13 2055 12/15/13 0240  AST 34 52*  ALT 29 35  ALKPHOS 62 66  BILITOT 1.1 1.6*  PROT 7.7 7.1  ALBUMIN 3.6 3.5    CBC:  Recent Labs Lab 12/14/13 2055 12/16/13 0420  WBC 10.2 16.9*  HGB 14.9 13.5  HCT 44.2 38.8  MCV 102.6* 99.7  PLT 196 180    Cardiac Enzymes:  Recent Labs Lab 12/14/13 2055 12/15/13 0240 12/15/13 0820  TROPONINI 0.47* 0.43* <0.30    BNP:  Recent Labs  03/05/13 1725 07/16/13 0100 12/14/13 2055  PROBNP 1327.0* 1656.0* 1144.0*    Coagulation:  Recent Labs Lab 12/14/13 2055 12/15/13 0240 12/16/13 0420  INR 1.10 1.10 0.97    ECG:   Medications:     Scheduled Medications: . aspirin  81 mg Oral Daily  . atorvastatin  80 mg Oral QHS  . azithromycin  250 mg Oral Daily  . budesonide (PULMICORT) nebulizer solution  0.25 mg Nebulization BID  . Chlorhexidine Gluconate Cloth  6 each Topical Q0600  . enoxaparin (LOVENOX) injection  105 mg Subcutaneous Q12H  . furosemide  80 mg Oral BID  . insulin aspart  0-5 Units Subcutaneous QHS  . insulin aspart  0-9 Units Subcutaneous TID WC  . ipratropium  0.5 mg Nebulization BID  . levalbuterol  0.63 mg Nebulization BID  . metoprolol tartrate  25 mg Oral Q8H  . mupirocin ointment  1 application Nasal BID  . nystatin ointment   Topical TID  . pantoprazole  80 mg Oral Q1200  . potassium chloride  40 mEq Oral Daily  . [START ON 12/17/2013] predniSONE  50 mg Oral Q breakfast  . QUEtiapine  50 mg Oral BID  . sertraline  50 mg Oral Daily  . sodium chloride  3 mL Intravenous Q12H  . traZODone  100 mg Oral QHS  . Warfarin - Pharmacist Dosing Inpatient   Does not apply  q1800     Infusions:     PRN Medications:  benzonatate, guaiFENesin-dextromethorphan, HYDROcodone-acetaminophen, levalbuterol, ondansetron (ZOFRAN) IV, ondansetron     Assessment/Plan    48 yo female hx of COPD, chronic systolic heart failure LVEF 35-40%, afib/flutter, DM2 admitted with SOB and found to have elevated troponin.  1. Acute on chroncic systolic heart failure - echo 03/09/67 shows LVEF 40-45%, no focal WMAs along with moderate RV systolic dysfunction. Diastolic function was indeterminate. - she is on lasix 80mg  po bid, metoprolol 25mg  tid. According to chart her weight has come down considerably,  - her medication regimen is unclear to me whether she was on coreg or metoprolol at home. Her original cardiology consult note states she was on metoprolol when discharged from [place]. Given her systolic dysfunction she should be on either coreg or Toprol XL, short acting metoprolol is not the best medication for her. She  reprorts current issues with her insurance and medications, I would suggest putting her on coreg which can be obtained from [employer] on the 4 dollar list. Toprol XL will likely be expensive for her.  - in setting of systolic dysfunction also recommend starting low dose ACE-I  2. Elevated troponin - mild troponin elevation, peak 0.47 that trended down and is not undetectable.  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   3. Afib  - continue rate control and anticoagulation.      [name], M.D., F.A.C.C.

## 2013-12-17 LAB — CBC
HCT: 36.5 % (ref 36.0–46.0)
Hemoglobin: 12.7 g/dL (ref 12.0–15.0)
MCH: 34.5 pg — ABNORMAL HIGH (ref 26.0–34.0)
MCHC: 34.8 g/dL (ref 30.0–36.0)
MCV: 99.2 fL (ref 78.0–100.0)
Platelets: 182 10*3/uL (ref 150–400)
RBC: 3.68 MIL/uL — ABNORMAL LOW (ref 3.87–5.11)
RDW: 14.8 % (ref 11.5–15.5)
WBC: 17.6 10*3/uL — ABNORMAL HIGH (ref 4.0–10.5)

## 2013-12-17 LAB — BASIC METABOLIC PANEL
BUN: 18 mg/dL (ref 6–23)
CALCIUM: 7.8 mg/dL — AB (ref 8.4–10.5)
CO2: 29 mEq/L (ref 19–32)
CREATININE: 0.86 mg/dL (ref 0.50–1.10)
Chloride: 93 mEq/L — ABNORMAL LOW (ref 96–112)
GFR calc non Af Amer: 78 mL/min — ABNORMAL LOW (ref >90)
Glucose, Bld: 86 mg/dL (ref 70–99)
Potassium: 3.5 mEq/L — ABNORMAL LOW (ref 3.7–5.3)
Sodium: 139 mEq/L (ref 137–147)

## 2013-12-17 LAB — GLUCOSE, CAPILLARY
GLUCOSE-CAPILLARY: 252 mg/dL — AB (ref 70–99)
Glucose-Capillary: 111 mg/dL — ABNORMAL HIGH (ref 70–99)

## 2013-12-17 LAB — PROTIME-INR
INR: 1.26 (ref 0.00–1.49)
PROTHROMBIN TIME: 15.5 s — AB (ref 11.6–15.2)

## 2013-12-17 MED ORDER — ASPIRIN 81 MG PO CHEW: 81.0000 mg | CHEWABLE_TABLET | Freq: Every day | ORAL | Status: DC

## 2013-12-17 MED ORDER — AZITHROMYCIN 250 MG PO TABS: 250.0000 mg | ORAL_TABLET | Freq: Every day | ORAL | Status: DC

## 2013-12-17 MED ORDER — CARVEDILOL 6.25 MG PO TABS: 6.2500 mg | ORAL_TABLET | Freq: Two times a day (BID) | ORAL | Status: DC

## 2013-12-17 MED ORDER — ENOXAPARIN SODIUM 120 MG/0.8ML ~~LOC~~ SOLN: 105.0000 mg | Freq: Two times a day (BID) | SUBCUTANEOUS | Status: DC

## 2013-12-17 MED ORDER — PREDNISONE 5 MG PO TABS: ORAL_TABLET | ORAL | Status: DC

## 2013-12-17 MED ORDER — ENOXAPARIN SODIUM 150 MG/ML ~~LOC~~ SOLN: SUBCUTANEOUS | Status: DC

## 2013-12-17 NOTE — Discharge Instructions (Signed)
Follow with Primary MD in 2 days   Get CBC, CMP, INR checked 2 days by Primary MD and again as instructed by your Primary MD. Get a 2 view Chest X ray done next visit . Get coumadin-Lovenox dose adjusted   Activity: As tolerated with Full fall precautions use walker/cane & assistance as needed   Disposition Home     Diet:  Heart Healthy,  Fluid restriction 1.8 lit/day, Aspiration precautions.  For Heart failure patients - Check your Weight same time everyday, if you gain over 2 pounds, or you develop in leg swelling, experience more shortness of breath or chest pain, call your Primary MD immediately. Follow Cardiac Low Salt Diet and 1.8 lit/day fluid restriction.   On your next visit with her primary care physician please Get Medicines reviewed and adjusted.  Please request your Prim.MD to go over all Hospital Tests and Procedure/Radiological results at the follow up, please get all Hospital records sent to your Prim MD by signing hospital release before you go home.   If you experience worsening of your admission symptoms, develop shortness of breath, life threatening emergency, suicidal or homicidal thoughts you must seek medical attention immediately by calling 911 or calling your MD immediately  if symptoms less severe.  You Must read complete instructions/literature along with all the possible adverse reactions/side effects for all the Medicines you take and that have been prescribed to you. Take any new Medicines after you have completely understood and accpet all the possible adverse reactions/side effects.   Do not drive and provide baby sitting services if your were admitted for syncope or siezures until you have seen by Primary MD or a Neurologist and advised to do so again.  Do not drive when taking Pain medications.    Do not take more than prescribed Pain, Sleep and Anxiety Medications  Special Instructions: If you have smoked or chewed Tobacco  in the last 2 yrs please  stop smoking, stop any regular Alcohol  and or any Recreational drug use.  Wear Seat belts while driving.   Please note  You were cared for by a hospitalist during your hospital stay. If you have any questions about your discharge medications or the care you received while you were in the hospital after you are discharged, you can call the unit and asked to speak with the hospitalist on call if the hospitalist that took care of you is not available. Once you are discharged, your primary care physician will handle any further medical issues. Please note that NO REFILLS for any discharge medications will be authorized once you are discharged, as it is imperative that you return to your primary care physician (or establish a relationship with a primary care physician if you do not have one) for your aftercare needs so that they can reassess your need for medications and monitor your lab values.   Information on my medicine - Coumadin   (Warfarin)  This medication education was reviewed with me or my healthcare representative as part of my discharge preparation.  The pharmacist that spoke with me during my hospital stay was:  Renaee Munda, [facility]  Why was Coumadin prescribed for you? Coumadin was prescribed for you because you have a blood clot or a medical condition that can cause an increased risk of forming blood clots. Blood clots can cause serious health problems by blocking the flow of blood to the heart, lung, or brain. Coumadin can prevent harmful blood clots from forming. As a  reminder your indication for Coumadin is:   Stroke Prevention Because Of Atrial Fibrillation  What test will check on my response to Coumadin? While on Coumadin (warfarin) you will need to have an INR test regularly to ensure that your dose is keeping you in the desired range. The INR (international normalized ratio) number is calculated from the result of the laboratory test called prothrombin time (PT).  If an INR  APPOINTMENT HAS NOT ALREADY BEEN MADE FOR YOU please schedule an appointment to have this lab work done by your health care provider within 7 days. Your INR goal is usually a number between:  2 to 3 or your provider may give you a more narrow range like 2-2.5.  Ask your health care provider during an office visit what your goal INR is.  What  do you need to  know  About  COUMADIN? Take Coumadin (warfarin) exactly as prescribed by your healthcare provider about the same time each day.  DO NOT stop taking without talking to the doctor who prescribed the medication.  Stopping without other blood clot prevention medication to take the place of Coumadin may increase your risk of developing a new clot or stroke.  Get refills before you run out.  What do you do if you miss a dose? If you miss a dose, take it as soon as you remember on the same day then continue your regularly scheduled regimen the next day.  Do not take two doses of Coumadin at the same time.  Important Safety Information A possible side effect of Coumadin (Warfarin) is an increased risk of bleeding. You should call your healthcare provider right away if you experience any of the following:   Bleeding from an injury or your nose that does not stop.   Unusual colored urine (red or dark brown) or unusual colored stools (red or black).   Unusual bruising for unknown reasons.   A serious fall or if you hit your head (even if there is no bleeding).  Some foods or medicines interact with Coumadin (warfarin) and might alter your response to warfarin. To help avoid this:   Eat a balanced diet, maintaining a consistent amount of Vitamin K.   Notify your provider about major diet changes you plan to make.   Avoid alcohol or limit your intake to 1 drink for women and 2 drinks for men per day. (1 drink is 5 oz. wine, 12 oz. beer, or 1.5 oz. liquor.)  Make sure that ANY health care provider who prescribes medication for you knows that you are  taking Coumadin (warfarin).  Also make sure the healthcare provider who is monitoring your Coumadin knows when you have started a new medication including herbals and non-prescription products.  Coumadin (Warfarin)  Major Drug Interactions  Increased Warfarin Effect Decreased Warfarin Effect  Alcohol (large quantities) Antibiotics (esp. Septra/Bactrim, Flagyl, Cipro) Amiodarone (Cordarone) Aspirin (ASA) Cimetidine (Tagamet) Megestrol (Megace) NSAIDs (ibuprofen, naproxen, etc.) Piroxicam (Feldene) Propafenone (Rythmol SR) Propranolol (Inderal) Isoniazid (INH) Posaconazole (Noxafil) Barbiturates (Phenobarbital) Carbamazepine (Tegretol) Chlordiazepoxide (Librium) Cholestyramine (Questran) Griseofulvin Oral Contraceptives Rifampin Sucralfate (Carafate) Vitamin K   Coumadin (Warfarin) Major Herbal Interactions  Increased Warfarin Effect Decreased Warfarin Effect  Garlic Ginseng Ginkgo biloba Coenzyme Q10 Green tea St. Johns wort    Coumadin (Warfarin) FOOD Interactions  Eat a consistent number of servings per week of foods HIGH in Vitamin K (1 serving =  cup)  Collards (cooked, or boiled & drained) Kale (cooked, or boiled & drained) Mustard greens (  cooked, or boiled & drained) Parsley *serving size only =  cup Spinach (cooked, or boiled & drained) Swiss chard (cooked, or boiled & drained) Turnip greens (cooked, or boiled & drained)  Eat a consistent number of servings per week of foods MEDIUM-HIGH in Vitamin K (1 serving = 1 cup)  Asparagus (cooked, or boiled & drained) Broccoli (cooked, boiled & drained, or raw & chopped) Brussel sprouts (cooked, or boiled & drained) *serving size only =  cup Lettuce, raw (green leaf, endive, romaine) Spinach, raw Turnip greens, raw & chopped   These websites have more information on Coumadin (warfarin):  http://www.king-russell.com/; https://www.hines.net/;

## 2013-12-17 NOTE — Care Management Note (Signed)
    Page 1 of 1   12/17/2013     5:15:14 PM   CARE MANAGEMENT NOTE 12/17/2013  Patient:  Kim [name], Kim [name]   Account Number:  [id number]  Date Initiated:  12/15/2013  Documentation initiated by:  MAYO,HENRIETTA  Subjective/Objective Assessment:   adm with dx of     Action/Plan:   discharge planning   Anticipated DC Date:  12/17/2013   Anticipated DC Plan:  HOME/SELF CARE      DC Planning Services  CM consult  Medication Assistance      Choice offered to / List presented to:             Status of service:  Completed, signed off Medicare Important Message given?   (If response is "NO", the following Medicare IM given date fields will be blank) Date Medicare IM given:   Date Additional Medicare IM given:    Discharge Disposition:  HOME/SELF CARE  Per UR Regulation:    If discussed at Long Length of Stay Meetings, dates discussed:    Comments:  12/17/13 CM received call from RN bc lovenox requires preauthorization prior to dispensing.  CM called down to in-house pharmacy who will provide 5 doses of lovenox which will cover pt until she goes to her PCP for followup. Richie(pt's son) will explain to followup MD that the followup office needs to get pre-authorization to get the rest of the lovenox injections at her pharmacy.  RN to explain to dicharging physician for prescription for our pharmacy and then preauthorization needs to be done at followup.  CM spoke to Richie who verbalized what he needs to do at his mother's followup appt.  No other needs were communicated.  Freddy Jaksch, BSN, [place] [phone]. 12/17/13 09:48 Pt refuses Xarelto.  No other CM needs communicated.  Freddy Jaksch, BSN, CM [phone].

## 2013-12-17 NOTE — Discharge Summary (Signed)
[facility]                                                                                   Kim [name], is a 48 y.o. female  DOB 1966-08-13  MRN [id number].  Admission date:  12/14/2013  Admitting Physician  Cristal Ford, MD  Discharge Date:  12/17/2013   Primary MD  Provider Not In System  Recommendations for primary care physician for things to follow:   Monitor INR closely, adjust Lovenox Coumadin dose Repeat CBC, BMP and 2 view chest x-ray in a week   Admission Diagnosis  Atrial fibrillation [427.31] Cough [786.2] Acute on chronic combined systolic and diastolic heart failure [428.43] NSTEMI (non-ST elevated myocardial infarction) [410.70] Chest pain [786.50]  Discharge Diagnosis   COPD exacerbation  Principal Problem:   Chest pain Active Problems:   COPD (chronic obstructive pulmonary disease)   Atrial fibrillation   Coronary atherosclerosis of native coronary artery   Chronic systolic CHF (congestive heart failure)   Pacemaker   Cirrhosis of liver due to hepatitis C   Shortness of breath   Depression with anxiety   Tobacco abuse   NSTEMI (non-ST elevated myocardial infarction)      Past Medical History  Diagnosis Date  . Pacemaker     vent paced  . COPD (chronic obstructive pulmonary disease)   . CHF (congestive heart failure)   . Thyroid disease   . Hepatitis     Hep C  . MI (myocardial infarction)   . Pneumonia   . Dysrhythmia     atrial fibrilation  . Cirrhosis   . Shortness of breath   . Diabetes mellitus   . Chest pain   . GERD (gastroesophageal reflux disease)   . Headache(784.0)   . Anxiety   . Permanent atrial fibrillation   . Chronic pain   . PE (pulmonary embolism)   . Hepatitis C     Past Surgical History  Procedure Laterality Date  . Cholecystectomy    . Pacemaker insertion  02/2012    Biotronik PPM      Discharge Condition: stable       Follow-up Information   Follow up with PCP. Schedule an appointment as  soon as possible for a visit in 1 week.      Follow up with [name], F, MD. Schedule an appointment as soon as possible for a visit in 2 weeks.   Specialty:  Cardiology   Contact information:   [address] [place] [address] [phone]       Follow up with Stan Head, MD. Schedule an appointment as soon as possible for a visit in 2 weeks.   Specialty:  Gastroenterology   Contact information:   [address] [place] [address] [phone]         Consults obtained -     Discharge Medications      Medication List    STOP taking these medications       benzonatate 100 MG capsule  Commonly known as:  TESSALON      TAKE these medications       albuterol  108 (90 BASE) MCG/ACT inhaler  Commonly known as:  PROVENTIL HFA;VENTOLIN HFA  Inhale 2 puffs into the lungs every 6 (six) hours as needed for wheezing or shortness of breath.     aspirin 81 MG chewable tablet  Chew 1 tablet (81 mg total) by mouth daily.     atorvastatin 80 MG tablet  Commonly known as:  LIPITOR  Take 80 mg by mouth at bedtime.     azithromycin 250 MG tablet  Commonly known as:  ZITHROMAX  Take 1 tablet (250 mg total) by mouth daily.     budesonide-formoterol 80-4.5 MCG/ACT inhaler  Commonly known as:  SYMBICORT  Inhale 2 puffs into the lungs 2 (two) times daily.     carvedilol 6.25 MG tablet  Commonly known as:  COREG  Take 6.25 mg by mouth 2 (two) times daily with a meal.     enoxaparin 120 MG/0.8ML injection  Commonly known as:  LOVENOX  Inject 0.7 mLs (105 mg total) into the skin every 12 (twelve) hours.     esomeprazole 40 MG capsule  Commonly known as:  NEXIUM  Take 40 mg by mouth daily before breakfast.     furosemide 80 MG tablet  Commonly known as:  LASIX  Take 80 mg by mouth 2 (two) times daily.     HYDROcodone-acetaminophen 5-325 MG per tablet  Commonly known as:  NORCO/VICODIN  Take 1 tablet by mouth every 4 (four) hours as needed.  pain     potassium chloride 10 MEQ tablet  Commonly known as:  K-DUR,KLOR-CON  Take 40 mEq by mouth daily.     predniSONE 5 MG tablet  Commonly known as:  DELTASONE  Label  & dispense according to the schedule below. 10 Pills PO for 3 days then, 8 Pills PO for 3 days, 6 Pills PO for 3 days, 4 Pills PO for 3 days, 2 Pills PO for 3 days, 1 Pills PO for 3 days, 1/2 Pill  PO for 3 days then STOP. Total 95 pills.     QUEtiapine 25 MG tablet  Commonly known as:  SEROQUEL  Take 50 mg by mouth 2 (two) times daily.     sertraline 50 MG tablet  Commonly known as:  ZOLOFT  Take 50 mg by mouth daily.     tiotropium 18 MCG inhalation capsule  Commonly known as:  SPIRIVA  Place 18 mcg into inhaler and inhale daily.     traZODone 100 MG tablet  Commonly known as:  DESYREL  Take 1 tablet (100 mg total) by mouth at bedtime.     warfarin 4 MG tablet  Commonly known as:  COUMADIN  Take 4 mg by mouth daily.         Diet and Activity recommendation: See Discharge Instructions below   Discharge Instructions     Follow with Primary MD in 2 days   Get CBC, CMP, INR checked 2 days by Primary MD and again as instructed by your Primary MD. Get a 2 view Chest X ray done next visit . Get coumadin-Lovenox dose adjusted   Activity: As tolerated with Full fall precautions use walker/cane & assistance as needed   Disposition Home     Diet:  Heart Healthy,  Fluid restriction 1.8 lit/day, Aspiration precautions.  For Heart failure patients - Check your Weight same time everyday, if you gain over 2 pounds, or you develop in leg swelling, experience more shortness of breath or chest pain, call your Primary MD immediately.  Follow Cardiac Low Salt Diet and 1.8 lit/day fluid restriction.   On your next visit with her primary care physician please Get Medicines reviewed and adjusted.  Please request your Prim.MD to go over all Hospital Tests and Procedure/Radiological results at the follow up, please  get all Hospital records sent to your Prim MD by signing hospital release before you go home.   If you experience worsening of your admission symptoms, develop shortness of breath, life threatening emergency, suicidal or homicidal thoughts you must seek medical attention immediately by calling 911 or calling your MD immediately  if symptoms less severe.  You Must read complete instructions/literature along with all the possible adverse reactions/side effects for all the Medicines you take and that have been prescribed to you. Take any new Medicines after you have completely understood and accpet all the possible adverse reactions/side effects.   Do not drive and provide baby sitting services if your were admitted for syncope or siezures until you have seen by Primary MD or a Neurologist and advised to do so again.  Do not drive when taking Pain medications.    Do not take more than prescribed Pain, Sleep and Anxiety Medications  Special Instructions: If you have smoked or chewed Tobacco  in the last 2 yrs please stop smoking, stop any regular Alcohol  and or any Recreational drug use.  Wear Seat belts while driving.   Please note  You were cared for by a hospitalist during your hospital stay. If you have any questions about your discharge medications or the care you received while you were in the hospital after you are discharged, you can call the unit and asked to speak with the hospitalist on call if the hospitalist that took care of you is not available. Once you are discharged, your primary care physician will handle any further medical issues. Please note that NO REFILLS for any discharge medications will be authorized once you are discharged, as it is imperative that you return to your primary care physician (or establish a relationship with a primary care physician if you do not have one) for your aftercare needs so that they can reassess your need for medications and monitor your lab  values.    Major procedures and Radiology Reports - PLEASE review detailed and final reports for all details, in brief -     Echo gram   - Normal LV size with mild LV hypertrophy. EF 40-45% with septal-lateral dyssynchrony (RV pacing) and global hypokinesis. Normal RV size with mild to moderately decreased systolic function   Dg Chest 2 View  12/16/2013   CLINICAL DATA:  COPD, cough  EXAM: CHEST  2 VIEW  COMPARISON:  12/14/2013  FINDINGS: Cardiac shadow is again mildly enlarged. A pacing device is again seen. The lungs are clear bilaterally. No acute bony abnormality is seen.  IMPRESSION: No acute abnormality noted.   Electronically Signed   By: Alcide CleverMark  Lukens M.D.   On: 12/16/2013 16:26   Dg Chest 2 View  12/14/2013   CLINICAL DATA:  Fever, cough, congestion.  EXAM: CHEST  2 VIEW  COMPARISON:  10/06/2013.  FINDINGS: Chronic cardiomegaly. Dual-chamber pacer from the left shows no interval displacement. Mitral annular calcification. No infiltrate or edema. No effusion or pneumothorax.  IMPRESSION: No active cardiopulmonary disease.   Electronically Signed   By: Tiburcio PeaJonathan  Watts M.D.   On: 12/14/2013 20:56    Micro Results      Recent Results (from the past 240 hour(s))  MRSA PCR SCREENING     Status: Abnormal   Collection Time    12/15/13  1:49 AM      Result Value Range Status   MRSA by PCR POSITIVE (*) NEGATIVE Final   Comment:            The GeneXpert MRSA Assay (FDA     approved for NASAL specimens     only), is one component of a     comprehensive MRSA colonization     surveillance program. It is not     intended to diagnose MRSA     infection nor to guide or     monitor treatment for     MRSA infections.     RESULT CALLED TO, READ BACK BY AND VERIFIED WITH:     CALLED TO RN TINA SAVAGE [phone] @[phone]  THANEY     History of present illness and  Hospital Course:     Kindly see H&P for history of present illness and admission details, please review complete Labs, Consult  reports and Test reports for all details in brief Kim [name], is a 48 y.o. female, patient with history of  congestive heart failure both systolic and diastolic with EF of 35-40% based on a 2-D echocardiogram in August of 2013, cirrhosis, diabetes, GERD, A. Fib on anti-coagulation with noncompliance with Coumadin at times, pulmonary embolism, hepatitis C, anxiety, hypothyroidism, and COPD who presented to the hospital with chief complaints of cough shortness of breath and nonspecific chest discomfort most likely related to reflux. She initially presented to any Hospital and from there was transferred to [facility] for further care.   Her cough and shortness of breath thought to be secondary to COPD exacerbation she initially received IV steroids along with azithromycin, oxygen and nebulizer treatments. She is now off of oxygen wheezing is minimal emanating in the hallway without any distress. Will be placed on oral steroid taper along with her 40s with azithromycin. Chest x-ray was clear. She has mild leukocytosis secondary to steroid use we'll request PCP to repeat CBC, BMP and a 2 view chest x-ray in one week's time.     Atypical chest pain CP w/ mild troponin elevation, troponin kinetics not consistent with ACS  Troponin has rapidly normalized - no further eval indicated per Cardiology, per cardiology this is not NSTEMI, continue aspirin along with beta blocker and statin for secondary prevention. Per cardiology outpatient follow with cardiology for further testing if needed. His chest pain and symptom-free will follow with cardiology in the outpatient setting.   Systolic CHF w/ mild acute exacerbation  A close to compensated, continue salt and fluid restriction, have switched her to Coreg and on low-dose ACE inhibitor, and any outpatient followup with PCP and cardiologist.   Afib was on Coumadin with poor INR control.  She refused xaralto, will be placed on Lovenox and Coumadin, follow  outpatient with PCP for INR monitoring. Current INR 1.26.    Macrocytosis  Outpatient followup with PCP, B12 and folate are normal   Prediabetic  A1c 6.4 - Outpatient followup by PCP   GERD  Continue On PPI    Hx of PE  Was on Coumadin, INR was subtherapeutic placed on Lovenox INR over.    Hepatitis C  Tbil mildly elevated at 1.6, but LFTs essentially normal and coags normal - Outpatient GI followup post discharge     THC + UDS  Counseled on absolute need to stop smoking all substances  Today   Subjective:   Mychael Soots today has no headache,no chest abdominal pain,no new weakness tingling or numbness, feels much better wants to go home today.    Objective:   Blood pressure 118/83, pulse 76, temperature 97.7 F (36.5 C), temperature source Oral, resp. rate 18, height 5\' 9"  (1.753 m), weight 105.4 kg (232 lb 5.8 oz), last menstrual period 02/08/2007, SpO2 97.00%.  No intake or output data in the 24 hours ending 12/17/13 0909  Exam Awake Alert, Oriented *3, No new F.N deficits, Normal affect [place].AT,PERRAL Supple Neck,No JVD, No cervical lymphadenopathy appriciated.  Symmetrical Chest wall movement, Good air movement bilaterally, minimal wheezing RRR,No Gallops,Rubs or new Murmurs, No Parasternal Heave +ve B.Sounds, Abd Soft, Non tender, No organomegaly appriciated, No rebound -guarding or rigidity. No Cyanosis, Clubbing or edema, No new Rash or bruise  Data Review   CBC w Diff:  Lab Results  Component Value Date   WBC 17.6* 12/17/2013   HGB 12.7 12/17/2013   HCT 36.5 12/17/2013   PLT 182 12/17/2013   LYMPHOPCT 23 12/14/2013   MONOPCT 12 12/14/2013   EOSPCT 1 12/14/2013   BASOPCT 0 12/14/2013    CMP:  Lab Results  Component Value Date   NA 139 12/17/2013   K 3.5* 12/17/2013   CL 93* 12/17/2013   CO2 29 12/17/2013   BUN 18 12/17/2013   CREATININE 0.86 12/17/2013   PROT 7.1 12/15/2013   ALBUMIN 3.5 12/15/2013   BILITOT 1.6* 12/15/2013   ALKPHOS 66  12/15/2013   AST 52* 12/15/2013   ALT 35 12/15/2013  .   Total Time in preparing paper work, data evaluation and todays exam - 35 minutes  Leroy Sea M.D on 12/17/2013 at 9:09 AM  [facility] Office  [phone]

## 2013-12-17 NOTE — Progress Notes (Addendum)
   CARE MANAGEMENT NOTE 12/17/2013  Patient:  Kim [name], Kim [name]   Account Number:  [id number]  Date Initiated:  12/15/2013  Documentation initiated by:  MAYO,HENRIETTA  Subjective/Objective Assessment:   adm with dx of     Action/Plan:   discharge planning   Anticipated DC Date:  12/17/2013   Anticipated DC Plan:  HOME/SELF CARE      DC Planning Services  CM consult  Medication Assistance      Choice offered to / List presented to:             Status of service:  Completed, signed off Medicare Important Message given?   (If response is "NO", the following Medicare IM given date fields will be blank) Date Medicare IM given:   Date Additional Medicare IM given:    Discharge Disposition:  HOME/SELF CARE  Per UR Regulation:    If discussed at Long Length of Stay Meetings, dates discussed:    Comments:  12/17/13 09:48 Pt refuses Xarelto.  CN gave resource list to pt and encouraged her to secure a PCP.  Pt verbalized understanding of PCP importance and she sees Dr Amalia Hailey on a regular basis but forgot that upon admission.   No other CM needs communicated.  Freddy Jaksch, BSN, CM [phone].

## 2013-12-17 NOTE — Progress Notes (Signed)
Discharge note. Pt had been waiting on her son for a ride home. Pt spoke with case management this morning who was willing to help pt if she could not get a ride home. Pt requested the D/C instruction be reviewed when her son was here since he would be helping her out and giving her Lovenox injections. Pt and son were getting discharge instructions along with Rx's when they stated they could not afford the Lovenox. MD and case management consulted again. Rx was changed so the [facility] pharmacy could fill 5 doses for pt to take home. Pt reports understanding of teaching and importance of follow up appointments. Pt discharged.

## 2013-12-17 NOTE — Progress Notes (Signed)
Patient ID: Kim DavenportSandra F [name], female   DOB: [birth year], 48 y.o.   MRN: [id number]     Primary cardiologist:  Subjective:   Episode of palpitations this morning. Otherwise no complaints   Objective:   Temp:  [97.5 F (36.4 C)-97.7 F (36.5 C)] 97.7 F (36.5 C) (01/11 0507) Pulse Rate:  [70-79] 76 (01/11 0507) Resp:  [18-20] 18 (01/11 0507) BP: (118-140)/(80-83) 118/83 mmHg (01/11 0507) SpO2:  [95 %-97 %] 97 % (01/11 0507) Weight:  [232 lb 5.8 oz (105.4 kg)] 232 lb 5.8 oz (105.4 kg) (01/11 0507) Last BM Date: 12/15/13  Filed Weights   12/15/13 0500 12/16/13 0300 12/17/13 0507  Weight: 232 lb 12.9 oz (105.6 kg) 231 lb 14.8 oz (105.2 kg) 232 lb 5.8 oz (105.4 kg)   No intake or output data in the 24 hours ending 12/17/13 0840  Exam:  General:NAD  Resp: CTAB  Cardiac: irreg, no m/r/g, no JVD  GI: abdomen soft, NT, ND  JYN:[id number]:extremities are warm, no edema  Neuro: no focal deficits    Lab Results:  Basic Metabolic Panel:  Recent Labs Lab 12/15/13 0240 12/16/13 0420 12/17/13 0535  NA 142 141 139  K 4.2 4.1 3.5*  CL 106 100 93*  CO2 24 24 29   GLUCOSE 116* 201* 86  BUN 11 18 18   CREATININE 0.80 0.93 0.86  CALCIUM 8.4 8.3* 7.8*    Liver Function Tests:  Recent Labs Lab 12/14/13 2055 12/15/13 0240  AST 34 52*  ALT 29 35  ALKPHOS 62 66  BILITOT 1.1 1.6*  PROT 7.7 7.1  ALBUMIN 3.6 3.5    CBC:  Recent Labs Lab 12/14/13 2055 12/16/13 0420 12/17/13 0535  WBC 10.2 16.9* 17.6*  HGB 14.9 13.5 12.7  HCT 44.2 38.8 36.5  MCV 102.6* 99.7 99.2  PLT 196 180 182    Cardiac Enzymes:  Recent Labs Lab 12/14/13 2055 12/15/13 0240 12/15/13 0820  TROPONINI 0.47* 0.43* <0.30    BNP:  Recent Labs  03/05/13 1725 07/16/13 0100 12/14/13 2055  PROBNP 1327.0* 1656.0* 1144.0*    Coagulation:  Recent Labs Lab 12/15/13 0240 12/16/13 0420 12/17/13 0535  INR 1.10 0.97 1.26    ECG:   Medications:   Scheduled Medications: . aspirin  81 mg Oral  Daily  . atorvastatin  80 mg Oral QHS  . azithromycin  250 mg Oral Daily  . budesonide (PULMICORT) nebulizer solution  0.25 mg Nebulization BID  . carvedilol  3.125 mg Oral BID WC  . Chlorhexidine Gluconate Cloth  6 each Topical Q0600  . enoxaparin (LOVENOX) injection  105 mg Subcutaneous Q12H  . furosemide  80 mg Oral BID  . insulin aspart  0-5 Units Subcutaneous QHS  . insulin aspart  0-9 Units Subcutaneous TID WC  . ipratropium  0.5 mg Nebulization BID  . levalbuterol  0.63 mg Nebulization BID  . lisinopril  2.5 mg Oral Daily  . mupirocin ointment  1 application Nasal BID  . nystatin ointment   Topical TID  . pantoprazole  80 mg Oral Q1200  . potassium chloride  40 mEq Oral Daily  . predniSONE  40 mg Oral Q breakfast  . QUEtiapine  50 mg Oral BID  . sertraline  50 mg Oral Daily  . sodium chloride  3 mL Intravenous Q12H  . traZODone  100 mg Oral QHS  . Warfarin - Pharmacist Dosing Inpatient   Does not apply q1800     Infusions:     PRN Medications:  benzonatate,  guaiFENesin-dextromethorphan, HYDROcodone-acetaminophen, levalbuterol, ondansetron (ZOFRAN) IV, ondansetron     Assessment/Plan   1. Acute on chroncic systolic heart failure  - echo 12/15/13 shows LVEF 40-45%, no focal WMAs along with moderate RV systolic dysfunction. Diastolic function was indeterminate.  - she is on lasix 80mg  po bid. According to chart her weight has come down considerably, renal function remains stable.  - agree with coreg and ACE-I in the setting of systolic dysfunction, this can be further titrated as an outpatient.   2. Elevated troponin  - mild troponin elevation, peak 0.47 that trended down and is not undetectable.  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     3. Afib  - continue rate control and anticoagulation.  - reports approx 10 min episode of palpitations this morning, she is not on tele. Heart rates normal now, will increase coreg to 6.25mg  bid for better rate control.         [name], M.D., F.A.C.C.

## 2013-12-22 ENCOUNTER — Encounter (HOSPITAL_COMMUNITY): Payer: Self-pay | Admitting: Emergency Medicine

## 2013-12-22 ENCOUNTER — Emergency Department (HOSPITAL_COMMUNITY): Payer: Medicaid Other

## 2013-12-22 ENCOUNTER — Inpatient Hospital Stay (HOSPITAL_COMMUNITY)
Admission: EM | Admit: 2013-12-22 | Discharge: 2013-12-25 | DRG: 291 | Disposition: A | Discharge status: Home Health | Payer: Medicaid Other | Attending: Internal Medicine | Admitting: Internal Medicine

## 2013-12-22 DIAGNOSIS — Z86711 Personal history of pulmonary embolism: Secondary | ICD-10-CM

## 2013-12-22 DIAGNOSIS — I5043 Acute on chronic combined systolic (congestive) and diastolic (congestive) heart failure: Principal | ICD-10-CM | POA: Diagnosis present

## 2013-12-22 DIAGNOSIS — Z7982 Long term (current) use of aspirin: Secondary | ICD-10-CM

## 2013-12-22 DIAGNOSIS — F17200 Nicotine dependence, unspecified, uncomplicated: Secondary | ICD-10-CM | POA: Diagnosis present

## 2013-12-22 DIAGNOSIS — R079 Chest pain, unspecified: Secondary | ICD-10-CM

## 2013-12-22 DIAGNOSIS — F411 Generalized anxiety disorder: Secondary | ICD-10-CM | POA: Diagnosis present

## 2013-12-22 DIAGNOSIS — I252 Old myocardial infarction: Secondary | ICD-10-CM

## 2013-12-22 DIAGNOSIS — Z8249 Family history of ischemic heart disease and other diseases of the circulatory system: Secondary | ICD-10-CM

## 2013-12-22 DIAGNOSIS — R197 Diarrhea, unspecified: Secondary | ICD-10-CM

## 2013-12-22 DIAGNOSIS — K219 Gastro-esophageal reflux disease without esophagitis: Secondary | ICD-10-CM | POA: Diagnosis present

## 2013-12-22 DIAGNOSIS — J9620 Acute and chronic respiratory failure, unspecified whether with hypoxia or hypercapnia: Secondary | ICD-10-CM | POA: Diagnosis present

## 2013-12-22 DIAGNOSIS — I4891 Unspecified atrial fibrillation: Secondary | ICD-10-CM | POA: Diagnosis present

## 2013-12-22 DIAGNOSIS — B1920 Unspecified viral hepatitis C without hepatic coma: Secondary | ICD-10-CM | POA: Diagnosis present

## 2013-12-22 DIAGNOSIS — T380X5A Adverse effect of glucocorticoids and synthetic analogues, initial encounter: Secondary | ICD-10-CM | POA: Diagnosis present

## 2013-12-22 DIAGNOSIS — R778 Other specified abnormalities of plasma proteins: Secondary | ICD-10-CM

## 2013-12-22 DIAGNOSIS — J441 Chronic obstructive pulmonary disease with (acute) exacerbation: Secondary | ICD-10-CM | POA: Diagnosis present

## 2013-12-22 DIAGNOSIS — J449 Chronic obstructive pulmonary disease, unspecified: Secondary | ICD-10-CM | POA: Diagnosis present

## 2013-12-22 DIAGNOSIS — K7460 Unspecified cirrhosis of liver: Secondary | ICD-10-CM | POA: Diagnosis present

## 2013-12-22 DIAGNOSIS — R7989 Other specified abnormal findings of blood chemistry: Secondary | ICD-10-CM

## 2013-12-22 DIAGNOSIS — Z79899 Other long term (current) drug therapy: Secondary | ICD-10-CM

## 2013-12-22 DIAGNOSIS — Z7901 Long term (current) use of anticoagulants: Secondary | ICD-10-CM

## 2013-12-22 DIAGNOSIS — Z91199 Patient's noncompliance with other medical treatment and regimen due to unspecified reason: Secondary | ICD-10-CM

## 2013-12-22 DIAGNOSIS — Z950 Presence of cardiac pacemaker: Secondary | ICD-10-CM | POA: Diagnosis present

## 2013-12-22 DIAGNOSIS — Z9119 Patient's noncompliance with other medical treatment and regimen: Secondary | ICD-10-CM

## 2013-12-22 DIAGNOSIS — D72829 Elevated white blood cell count, unspecified: Secondary | ICD-10-CM | POA: Diagnosis present

## 2013-12-22 DIAGNOSIS — G8929 Other chronic pain: Secondary | ICD-10-CM | POA: Diagnosis present

## 2013-12-22 DIAGNOSIS — E669 Obesity, unspecified: Secondary | ICD-10-CM | POA: Diagnosis present

## 2013-12-22 DIAGNOSIS — I509 Heart failure, unspecified: Secondary | ICD-10-CM | POA: Diagnosis present

## 2013-12-22 DIAGNOSIS — E119 Type 2 diabetes mellitus without complications: Secondary | ICD-10-CM | POA: Diagnosis present

## 2013-12-22 LAB — CBC
HEMATOCRIT: 39.1 % (ref 36.0–46.0)
HEMOGLOBIN: 13.5 g/dL (ref 12.0–15.0)
MCH: 35.2 pg — ABNORMAL HIGH (ref 26.0–34.0)
MCHC: 34.5 g/dL (ref 30.0–36.0)
MCV: 101.8 fL — AB (ref 78.0–100.0)
Platelets: 166 10*3/uL (ref 150–400)
RBC: 3.84 MIL/uL — ABNORMAL LOW (ref 3.87–5.11)
RDW: 15.3 % (ref 11.5–15.5)
WBC: 7.9 10*3/uL (ref 4.0–10.5)

## 2013-12-22 LAB — COMPREHENSIVE METABOLIC PANEL
ALT: 29 U/L (ref 0–35)
AST: 29 U/L (ref 0–37)
Albumin: 3.2 g/dL — ABNORMAL LOW (ref 3.5–5.2)
Alkaline Phosphatase: 67 U/L (ref 39–117)
BILIRUBIN TOTAL: 0.6 mg/dL (ref 0.3–1.2)
BUN: 10 mg/dL (ref 6–23)
CHLORIDE: 100 meq/L (ref 96–112)
CO2: 33 meq/L — AB (ref 19–32)
Calcium: 8.8 mg/dL (ref 8.4–10.5)
Creatinine, Ser: 0.75 mg/dL (ref 0.50–1.10)
GFR calc Af Amer: >90 mL/min (ref >90)
Glucose, Bld: 123 mg/dL — ABNORMAL HIGH (ref 70–99)
POTASSIUM: 3.7 meq/L (ref 3.7–5.3)
Sodium: 143 mEq/L (ref 137–147)
TOTAL PROTEIN: 7.1 g/dL (ref 6.0–8.3)

## 2013-12-22 LAB — TROPONIN I: Troponin I: 0.45 ng/mL (ref <0.30)

## 2013-12-22 LAB — PROTIME-INR
INR: 1.23 (ref 0.00–1.49)
Prothrombin Time: 15.2 seconds (ref 11.6–15.2)

## 2013-12-22 LAB — PRO B NATRIURETIC PEPTIDE: PRO B NATRI PEPTIDE: 1579 pg/mL — AB (ref 0–125)

## 2013-12-22 MED ORDER — ALBUTEROL SULFATE (2.5 MG/3ML) 0.083% IN NEBU
5.0000 mg | INHALATION_SOLUTION | Freq: Once | RESPIRATORY_TRACT | Status: AC
  Administered 2013-12-22: 5 mg via RESPIRATORY_TRACT

## 2013-12-22 MED ORDER — METHYLPREDNISOLONE SODIUM SUCC 125 MG IJ SOLR
125.0000 mg | Freq: Once | INTRAMUSCULAR | Status: AC
Start: 2013-12-22 — End: 2013-12-22
  Administered 2013-12-22: 125 mg via INTRAVENOUS
  Filled 2013-12-22: qty 2

## 2013-12-22 NOTE — ED Notes (Signed)
Pt c/o chest discomfort, cough, sob, diarrhea, vomiting, states same symptoms as last visit here several days ago.

## 2013-12-22 NOTE — ED Provider Notes (Signed)
CSN: [id number]     Arrival date & time 12/22/13  2208 History  This chart was scribed for Kim Nielsen, MD by Karle Plumber, ED Scribe. This patient was seen in room APA02/APA02 and the patient's care was started at 11:00 PM.  Chief Complaint  Patient presents with  . Chest Pain  . Shortness of Breath   The history is provided by the patient. No language interpreter was used.   HPI Comments:  Kim [name] is a 48 y.o. obese female with h/o CHF and COPD who presents to the Emergency Department complaining of constant chest pain for 3 days. She describes the pain as stabbing and states it is worse with coughing. She denies taking anything for pain. She denies fever. She states she is a smoker. Pain moderate to severe without known alleviating factors. History of same with recent admission. Patient has not been checking her weight. Baseline shortness of breath. Has had some increased swelling and "feels congested".  Past Medical History  Diagnosis Date  . Pacemaker     vent paced  . COPD (chronic obstructive pulmonary disease)   . CHF (congestive heart failure)   . Thyroid disease   . Hepatitis     Hep C  . MI (myocardial infarction)   . Pneumonia   . Dysrhythmia     atrial fibrilation  . Cirrhosis   . Shortness of breath   . Diabetes mellitus   . Chest pain   . GERD (gastroesophageal reflux disease)   . Headache(784.0)   . Anxiety   . Permanent atrial fibrillation   . Chronic pain   . PE (pulmonary embolism)   . Hepatitis C    Past Surgical History  Procedure Laterality Date  . Cholecystectomy    . Pacemaker insertion  02/2012    Biotronik PPM    Family History  Problem Relation Age of Onset  . Coronary artery disease Father 54  . Coronary artery disease Mother 49  . Coronary artery disease Brother    History  Substance Use Topics  . Smoking status: Current Every Day Smoker -- 0.50 packs/day for 30 years    Types: Cigarettes  . Smokeless tobacco: Current User     Types: Snuff  . Alcohol Use: No   OB History   Grav Para Term Preterm Abortions TAB SAB Ect Mult Living                 Review of Systems  Constitutional: Negative for fever and chills.  Eyes: Negative for visual disturbance.  Respiratory: Positive for cough. Negative for shortness of breath.   Cardiovascular: Positive for chest pain.  Gastrointestinal: Positive for diarrhea. Negative for abdominal pain.  Genitourinary: Negative for dysuria.  Musculoskeletal: Negative for back pain, neck pain and neck stiffness.  Skin: Negative for rash.  Neurological: Negative for headaches.  All other systems reviewed and are negative.    Allergies  Nitroglycerin; Doxycycline; Flexeril; Ibuprofen; Tramadol; Tylenol; Vesicare; and Amoxil  Home Medications   Current Outpatient Rx  Name  Route  Sig  Dispense  Refill  . albuterol (PROVENTIL HFA;VENTOLIN HFA) 108 (90 BASE) MCG/ACT inhaler   Inhalation   Inhale 2 puffs into the lungs every 6 (six) hours as needed for wheezing or shortness of breath.          Marland Kitchen aspirin 81 MG chewable tablet   Oral   Chew 1 tablet (81 mg total) by mouth daily.   30 tablet   0   .  atorvastatin (LIPITOR) 80 MG tablet   Oral   Take 80 mg by mouth at bedtime.         . budesonide-formoterol (SYMBICORT) 80-4.5 MCG/ACT inhaler   Inhalation   Inhale 2 puffs into the lungs 2 (two) times daily.         . carvedilol (COREG) 6.25 MG tablet   Oral   Take 6.25 mg by mouth 2 (two) times daily with a meal.         . esomeprazole (NEXIUM) 40 MG capsule   Oral   Take 40 mg by mouth daily before breakfast.         . furosemide (LASIX) 80 MG tablet   Oral   Take 80 mg by mouth 2 (two) times daily.         . potassium chloride (K-DUR,KLOR-CON) 10 MEQ tablet   Oral   Take 40 mEq by mouth daily.         . QUEtiapine (SEROQUEL) 25 MG tablet   Oral   Take 50 mg by mouth 2 (two) times daily.         . sertraline (ZOLOFT) 50 MG tablet   Oral    Take 50 mg by mouth daily.         Marland Kitchen. tiotropium (SPIRIVA) 18 MCG inhalation capsule   Inhalation   Place 18 mcg into inhaler and inhale daily.         . traZODone (DESYREL) 100 MG tablet   Oral   Take 1 tablet (100 mg total) by mouth at bedtime.   30 tablet   0   . warfarin (COUMADIN) 4 MG tablet   Oral   Take 4 mg by mouth daily.         Marland Kitchen. azithromycin (ZITHROMAX) 250 MG tablet   Oral   Take 1 tablet (250 mg total) by mouth daily.   4 each   0   . enoxaparin (LOVENOX) 120 MG/0.8ML injection   Subcutaneous   Inject 0.7 mLs (105 mg total) into the skin every 12 (twelve) hours.   10 Syringe   0     Stop when INR is at goal - per your PCP   . enoxaparin (LOVENOX) 150 MG/ML injection      120mg  SQ BID   5 Syringe   0   . predniSONE (DELTASONE) 5 MG tablet      Label  & dispense according to the schedule below. 10 Pills PO for 3 days then, 8 Pills PO for 3 days, 6 Pills PO for 3 days, 4 Pills PO for 3 days, 2 Pills PO for 3 days, 1 Pills PO for 3 days, 1/2 Pill  PO for 3 days then STOP. Total 95 pills.   95 tablet   0     Dispense as written.    Triage Vitals: BP 144/80  Pulse 97  Temp(Src) 97.9 F (36.6 C) (Oral)  Resp 20  Ht 5\' 9"  (1.753 m)  Wt 240 lb (108.863 kg)  BMI 35.43 kg/m2  SpO2 98%  LMP 02/08/2007 Physical Exam  Nursing note and vitals reviewed. Constitutional: She is oriented to person, place, and time. She appears well-developed and well-nourished.  HENT:  Head: Normocephalic and atraumatic.  Eyes: EOM are normal. Pupils are equal, round, and reactive to light.  Neck: Normal range of motion. Neck supple.  Cardiovascular: Normal rate, regular rhythm and intact distal pulses.   1+ symmetric pitting edema.  Pulmonary/Chest: Effort normal. No respiratory distress.  She has wheezes (bilateral).  Reproducible substernal chest tenderness.  Abdominal: Soft. She exhibits no distension. There is no tenderness.  Musculoskeletal: Normal range of  motion. She exhibits edema. She exhibits no tenderness.  Neurological: She is alert and oriented to person, place, and time.  Skin: Skin is warm and dry.  Psychiatric: She has a normal mood and affect. Her behavior is normal.    ED Course  Procedures (including critical care time) DIAGNOSTIC STUDIES: Oxygen Saturation is 98% on RA, normal by my interpretation.   COORDINATION OF CARE: 11:04 PM- Will order a nebulizer treatment, start IV fluids and give Solu-Medrol. Pt verbalizes understanding and agrees to plan.  Medications  albuterol (PROVENTIL) (2.5 MG/3ML) 0.083% nebulizer solution 5 mg (5 mg Nebulization Given 12/22/13 2343)  methylPREDNISolone sodium succinate (SOLU-MEDROL) 125 mg/2 mL injection 125 mg (125 mg Intravenous Given 12/22/13 2332)    Labs Review Labs Reviewed  CBC - Abnormal; Notable for the following:    RBC 3.84 (*)    MCV 101.8 (*)    MCH 35.2 (*)    All other components within normal limits  TROPONIN I - Abnormal; Notable for the following:    Troponin I 0.45 (*)    All other components within normal limits  PRO B NATRIURETIC PEPTIDE - Abnormal; Notable for the following:    Pro B Natriuretic peptide (BNP) 1579.0 (*)    All other components within normal limits  COMPREHENSIVE METABOLIC PANEL - Abnormal; Notable for the following:    CO2 33 (*)    Glucose, Bld 123 (*)    Albumin 3.2 (*)    All other components within normal limits  PROTIME-INR   Imaging Review Dg Chest Portable 1 View  12/22/2013   CLINICAL DATA:  Chest pain, shortness of breath and wheezing.  EXAM: PORTABLE CHEST - 1 VIEW  COMPARISON:  Chest radiograph performed 12/16/2013  FINDINGS: The lungs are well-aerated and clear. There is no evidence of focal opacification, pleural effusion or pneumothorax.  The cardiomediastinal silhouette is borderline normal in size. Mild vascular congestion is noted. A pacemaker is noted overlying the left chest wall, with leads ending overlying the right atrium  and right ventricle. No acute osseous abnormalities are seen.  IMPRESSION: Mild vascular congestion noted; lungs remain grossly clear.   Electronically Signed   By: Roanna Raider M.D.   On: 12/22/2013 23:53    EKG Interpretation    Date/Time:  Friday December 22 2013 22:58:58 EST Ventricular Rate:  79 PR Interval:    QRS Duration: 184 QT Interval:  474 QTC Calculation: 543 R Axis:   -82 Text Interpretation:  Ventricular-paced rhythm with premature ventricular or aberrantly conducted complexes Abnormal ECG Confirmed by Sharla Tankard  MD, Kristain Filo ([id number]) on 12/22/2013 11:52:52 PM           Aspirin. IV morphine. Albuterol steroids for wheezing Labs reviewed initiated heparin Discussed with Dr. Sharl Ma. He evaluated patient bedside and plan local admission to step down unit. Patient does not want to be transferred to [facility], would prefer to go to [facility] if she needed to be transferred.  MDM  Dx: CP, elevated troponin, CHF, medical noncompliance  Recent hospitalization records reviewed with similar presentation to the Er at that time, elevated troponin and cardiology assessment as below: 1. Acute on chroncic systolic heart failure  - echo 12/15/13 shows LVEF 40-45%, no focal WMAs along with moderate RV systolic dysfunction. Diastolic function was indeterminate.  - she is on lasix 80mg  po bid. According to  chart her weight has come down considerably, renal function remains stable.  - agree with coreg and ACE-I in the setting of systolic dysfunction, this can be further titrated as an outpatient.  2. Elevated troponin  - mild troponin elevation, peak 0.47 that trended down and is not undetectable.  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  3. Afib  - continue rate control and  anticoagulation.  - reports approx 10 min episode of palpitations this morning, she is not on tele. Heart rates normal now, will increase coreg to 6.25mg  bid for better rate control.    EKG is paced. Chest x-ray reviewed as above. Labs reviewed as above. Medications provided. Admit step down unit     I personally performed the services described in this documentation, which was scribed in my presence. The recorded information has been reviewed and is accurate.    Kim Nielsen, MD 12/23/13 [phone]

## 2013-12-23 DIAGNOSIS — R197 Diarrhea, unspecified: Secondary | ICD-10-CM

## 2013-12-23 DIAGNOSIS — J441 Chronic obstructive pulmonary disease with (acute) exacerbation: Secondary | ICD-10-CM

## 2013-12-23 DIAGNOSIS — I4891 Unspecified atrial fibrillation: Secondary | ICD-10-CM

## 2013-12-23 DIAGNOSIS — I5043 Acute on chronic combined systolic (congestive) and diastolic (congestive) heart failure: Principal | ICD-10-CM

## 2013-12-23 DIAGNOSIS — I509 Heart failure, unspecified: Secondary | ICD-10-CM

## 2013-12-23 DIAGNOSIS — R079 Chest pain, unspecified: Secondary | ICD-10-CM

## 2013-12-23 DIAGNOSIS — J449 Chronic obstructive pulmonary disease, unspecified: Secondary | ICD-10-CM

## 2013-12-23 DIAGNOSIS — Z950 Presence of cardiac pacemaker: Secondary | ICD-10-CM

## 2013-12-23 DIAGNOSIS — R799 Abnormal finding of blood chemistry, unspecified: Secondary | ICD-10-CM

## 2013-12-23 LAB — COMPREHENSIVE METABOLIC PANEL
ALT: 31 U/L (ref 0–35)
AST: 35 U/L (ref 0–37)
Albumin: 3.3 g/dL — ABNORMAL LOW (ref 3.5–5.2)
Alkaline Phosphatase: 66 U/L (ref 39–117)
BUN: 11 mg/dL (ref 6–23)
CO2: 28 meq/L (ref 19–32)
Calcium: 8.5 mg/dL (ref 8.4–10.5)
Chloride: 99 mEq/L (ref 96–112)
Creatinine, Ser: 0.75 mg/dL (ref 0.50–1.10)
Glucose, Bld: 274 mg/dL — ABNORMAL HIGH (ref 70–99)
Potassium: 3.8 mEq/L (ref 3.7–5.3)
SODIUM: 140 meq/L (ref 137–147)
Total Bilirubin: 0.7 mg/dL (ref 0.3–1.2)
Total Protein: 7.1 g/dL (ref 6.0–8.3)

## 2013-12-23 LAB — CBC
HCT: 39 % (ref 36.0–46.0)
Hemoglobin: 13.5 g/dL (ref 12.0–15.0)
MCH: 35.2 pg — ABNORMAL HIGH (ref 26.0–34.0)
MCHC: 34.6 g/dL (ref 30.0–36.0)
MCV: 101.8 fL — AB (ref 78.0–100.0)
Platelets: 167 10*3/uL (ref 150–400)
RBC: 3.83 MIL/uL — AB (ref 3.87–5.11)
RDW: 15.5 % (ref 11.5–15.5)
WBC: 8 10*3/uL (ref 4.0–10.5)

## 2013-12-23 LAB — HEPARIN LEVEL (UNFRACTIONATED)
HEPARIN UNFRACTIONATED: 0.18 [IU]/mL — AB (ref 0.30–0.70)
Heparin Unfractionated: 0.23 IU/mL — ABNORMAL LOW (ref 0.30–0.70)

## 2013-12-23 LAB — GLUCOSE, CAPILLARY
GLUCOSE-CAPILLARY: 339 mg/dL — AB (ref 70–99)
Glucose-Capillary: 322 mg/dL — ABNORMAL HIGH (ref 70–99)
Glucose-Capillary: 263 mg/dL — ABNORMAL HIGH (ref 70–99)

## 2013-12-23 LAB — TROPONIN I
TROPONIN I: 0.35 ng/mL — AB (ref <0.30)
Troponin I: 0.4 ng/mL (ref <0.30)
Troponin I: 0.32 ng/mL (ref <0.30)

## 2013-12-23 MED ORDER — ASPIRIN 81 MG PO CHEW
324.0000 mg | CHEWABLE_TABLET | Freq: Once | ORAL | Status: AC
  Administered 2013-12-23: 324 mg via ORAL
  Filled 2013-12-23: qty 4

## 2013-12-23 MED ORDER — SODIUM CHLORIDE 0.9 % IJ SOLN: 3.0000 mL | INTRAMUSCULAR | Status: DC | PRN

## 2013-12-23 MED ORDER — CARVEDILOL 3.125 MG PO TABS
6.2500 mg | ORAL_TABLET | Freq: Two times a day (BID) | ORAL | Status: DC
  Administered 2013-12-23 – 2013-12-25 (×6): 6.25 mg via ORAL
  Filled 2013-12-23 (×6): qty 2

## 2013-12-23 MED ORDER — POTASSIUM CHLORIDE CRYS ER 20 MEQ PO TBCR
40.0000 meq | EXTENDED_RELEASE_TABLET | Freq: Every day | ORAL | Status: DC
Start: 2013-12-23 — End: 2013-12-25
  Administered 2013-12-23 – 2013-12-25 (×3): 40 meq via ORAL
  Filled 2013-12-23 (×3): qty 2

## 2013-12-23 MED ORDER — IPRATROPIUM-ALBUTEROL 0.5-2.5 (3) MG/3ML IN SOLN
3.0000 mL | Freq: Three times a day (TID) | RESPIRATORY_TRACT | Status: DC
  Administered 2013-12-23 – 2013-12-25 (×7): 3 mL via RESPIRATORY_TRACT
  Filled 2013-12-23 (×7): qty 3

## 2013-12-23 MED ORDER — FUROSEMIDE 10 MG/ML IJ SOLN
40.0000 mg | Freq: Two times a day (BID) | INTRAMUSCULAR | Status: DC
  Administered 2013-12-23 – 2013-12-25 (×4): 40 mg via INTRAVENOUS
  Filled 2013-12-23 (×4): qty 4

## 2013-12-23 MED ORDER — SERTRALINE HCL 50 MG PO TABS
50.0000 mg | ORAL_TABLET | Freq: Every day | ORAL | Status: DC
  Administered 2013-12-23 – 2013-12-25 (×3): 50 mg via ORAL
  Filled 2013-12-23 (×3): qty 1

## 2013-12-23 MED ORDER — FUROSEMIDE 10 MG/ML IJ SOLN: 20.0000 mg | Freq: Two times a day (BID) | INTRAMUSCULAR | Status: DC

## 2013-12-23 MED ORDER — HEPARIN BOLUS VIA INFUSION
4000.0000 [IU] | Freq: Once | INTRAVENOUS | Status: AC
  Administered 2013-12-23: 4000 [IU] via INTRAVENOUS

## 2013-12-23 MED ORDER — FUROSEMIDE 10 MG/ML IJ SOLN
40.0000 mg | Freq: Once | INTRAMUSCULAR | Status: AC
  Administered 2013-12-23: 40 mg via INTRAVENOUS
  Filled 2013-12-23: qty 4

## 2013-12-23 MED ORDER — INSULIN ASPART 100 UNIT/ML ~~LOC~~ SOLN
0.0000 [IU] | Freq: Every day | SUBCUTANEOUS | Status: DC
  Administered 2013-12-23: 4 [IU] via SUBCUTANEOUS
  Administered 2013-12-24: 5 [IU] via SUBCUTANEOUS

## 2013-12-23 MED ORDER — HEPARIN BOLUS VIA INFUSION
2000.0000 [IU] | Freq: Once | INTRAVENOUS | Status: AC
  Administered 2013-12-23: 2000 [IU] via INTRAVENOUS
  Filled 2013-12-23: qty 2000

## 2013-12-23 MED ORDER — ALBUTEROL SULFATE HFA 108 (90 BASE) MCG/ACT IN AERS: 2.0000 | INHALATION_SPRAY | Freq: Four times a day (QID) | RESPIRATORY_TRACT | Status: DC | PRN

## 2013-12-23 MED ORDER — QUETIAPINE FUMARATE 25 MG PO TABS
50.0000 mg | ORAL_TABLET | Freq: Two times a day (BID) | ORAL | Status: DC
  Administered 2013-12-23 – 2013-12-25 (×6): 50 mg via ORAL
  Filled 2013-12-23: qty 1
  Filled 2013-12-23: qty 2
  Filled 2013-12-23 (×4): qty 1
  Filled 2013-12-23 (×3): qty 2
  Filled 2013-12-23: qty 1
  Filled 2013-12-23: qty 2

## 2013-12-23 MED ORDER — OXYCODONE HCL 5 MG PO TABS
5.0000 mg | ORAL_TABLET | Freq: Four times a day (QID) | ORAL | Status: DC | PRN
  Administered 2013-12-24 – 2013-12-25 (×4): 5 mg via ORAL
  Filled 2013-12-23 (×6): qty 1

## 2013-12-23 MED ORDER — ASPIRIN 81 MG PO CHEW
81.0000 mg | CHEWABLE_TABLET | Freq: Every day | ORAL | Status: DC
  Administered 2013-12-23 – 2013-12-25 (×3): 81 mg via ORAL
  Filled 2013-12-23 (×3): qty 1

## 2013-12-23 MED ORDER — METHYLPREDNISOLONE SODIUM SUCC 125 MG IJ SOLR
60.0000 mg | Freq: Four times a day (QID) | INTRAMUSCULAR | Status: DC
  Administered 2013-12-23: 60 mg via INTRAVENOUS

## 2013-12-23 MED ORDER — WARFARIN - PHARMACIST DOSING INPATIENT: Status: DC

## 2013-12-23 MED ORDER — METHYLPREDNISOLONE SODIUM SUCC 125 MG IJ SOLR
60.0000 mg | Freq: Two times a day (BID) | INTRAMUSCULAR | Status: DC
  Administered 2013-12-23 – 2013-12-24 (×2): 60 mg via INTRAVENOUS
  Filled 2013-12-23 (×3): qty 2

## 2013-12-23 MED ORDER — SODIUM CHLORIDE 0.9 % IJ SOLN
3.0000 mL | Freq: Two times a day (BID) | INTRAMUSCULAR | Status: DC
  Administered 2013-12-23 (×2): 3 mL via INTRAVENOUS

## 2013-12-23 MED ORDER — MORPHINE SULFATE 2 MG/ML IJ SOLN
2.0000 mg | INTRAMUSCULAR | Status: DC | PRN
  Administered 2013-12-23 (×2): 2 mg via INTRAVENOUS
  Filled 2013-12-23 (×2): qty 1

## 2013-12-23 MED ORDER — INSULIN ASPART 100 UNIT/ML ~~LOC~~ SOLN: 0.0000 [IU] | Freq: Three times a day (TID) | SUBCUTANEOUS | Status: DC

## 2013-12-23 MED ORDER — IPRATROPIUM-ALBUTEROL 0.5-2.5 (3) MG/3ML IN SOLN
3.0000 mL | RESPIRATORY_TRACT | Status: DC
  Administered 2013-12-23 (×2): 3 mL via RESPIRATORY_TRACT
  Filled 2013-12-23 (×2): qty 3

## 2013-12-23 MED ORDER — TRAZODONE HCL 50 MG PO TABS
100.0000 mg | ORAL_TABLET | Freq: Every day | ORAL | Status: DC
  Administered 2013-12-23 – 2013-12-24 (×2): 100 mg via ORAL
  Filled 2013-12-23 (×2): qty 2

## 2013-12-23 MED ORDER — HEPARIN (PORCINE) IN NACL 100-0.45 UNIT/ML-% IJ SOLN
1500.0000 [IU]/h | INTRAMUSCULAR | Status: DC
  Administered 2013-12-23: 1300 [IU]/h via INTRAVENOUS
  Administered 2013-12-23: 1000 [IU]/h via INTRAVENOUS
  Administered 2013-12-24: 1500 [IU]/h via INTRAVENOUS
  Filled 2013-12-23 (×3): qty 250

## 2013-12-23 MED ORDER — MORPHINE SULFATE 4 MG/ML IJ SOLN
4.0000 mg | Freq: Once | INTRAMUSCULAR | Status: AC
  Administered 2013-12-23: 4 mg via INTRAVENOUS
  Filled 2013-12-23: qty 1

## 2013-12-23 MED ORDER — SODIUM CHLORIDE 0.9 % IV SOLN
250.0000 mL | INTRAVENOUS | Status: DC | PRN
  Administered 2013-12-23: 250 mL via INTRAVENOUS

## 2013-12-23 MED ORDER — INSULIN ASPART 100 UNIT/ML ~~LOC~~ SOLN
0.0000 [IU] | Freq: Three times a day (TID) | SUBCUTANEOUS | Status: DC
  Administered 2013-12-23: 8 [IU] via SUBCUTANEOUS
  Administered 2013-12-23: 11 [IU] via SUBCUTANEOUS
  Administered 2013-12-24: 3 [IU] via SUBCUTANEOUS
  Administered 2013-12-24: 8 [IU] via SUBCUTANEOUS
  Administered 2013-12-24: 11 [IU] via SUBCUTANEOUS
  Administered 2013-12-25: 2 [IU] via SUBCUTANEOUS
  Administered 2013-12-25: 3 [IU] via SUBCUTANEOUS

## 2013-12-23 MED ORDER — ONDANSETRON HCL 4 MG PO TABS: 4.0000 mg | ORAL_TABLET | Freq: Four times a day (QID) | ORAL | Status: DC | PRN

## 2013-12-23 MED ORDER — ONDANSETRON HCL 4 MG/2ML IJ SOLN
4.0000 mg | Freq: Four times a day (QID) | INTRAMUSCULAR | Status: DC | PRN
Start: 2013-12-23 — End: 2013-12-25

## 2013-12-23 MED ORDER — WARFARIN SODIUM 5 MG PO TABS
5.0000 mg | ORAL_TABLET | Freq: Once | ORAL | Status: AC
  Administered 2013-12-23: 5 mg via ORAL
  Filled 2013-12-23: qty 1

## 2013-12-23 MED ORDER — ONDANSETRON HCL 4 MG/2ML IJ SOLN
4.0000 mg | Freq: Once | INTRAMUSCULAR | Status: AC
  Administered 2013-12-23: 4 mg via INTRAVENOUS
  Filled 2013-12-23: qty 2

## 2013-12-23 MED ORDER — ATORVASTATIN CALCIUM 40 MG PO TABS
80.0000 mg | ORAL_TABLET | Freq: Every day | ORAL | Status: DC
  Administered 2013-12-23 – 2013-12-24 (×2): 80 mg via ORAL
  Filled 2013-12-23 (×2): qty 2

## 2013-12-23 MED ORDER — BUDESONIDE-FORMOTEROL FUMARATE 80-4.5 MCG/ACT IN AERO
2.0000 | INHALATION_SPRAY | Freq: Two times a day (BID) | RESPIRATORY_TRACT | Status: DC
Start: 2013-12-23 — End: 2013-12-25
  Administered 2013-12-23 – 2013-12-25 (×4): 2 via RESPIRATORY_TRACT
  Filled 2013-12-23: qty 6.9

## 2013-12-23 NOTE — Progress Notes (Signed)
Late entry: I noticed pts troponin level was still elevated at 0.35, but was trending down. Notified Dr. Kerry Hough, no new orders received. Sheryn Bison

## 2013-12-23 NOTE — ED Notes (Signed)
Sitting up, talking freely. In good spirits

## 2013-12-23 NOTE — Progress Notes (Signed)
Pt ambulated around nurses station with O2 on (which is chronic for her). Pt tolerated ambulation very well. Sheryn Bison

## 2013-12-23 NOTE — Progress Notes (Addendum)
ANTICOAGULATION CONSULT NOTE - Initial Consult  Pharmacy Consult for Heparin --> Coumadin Indication: atrial fibrillation, hx PE  Allergies  Allergen Reactions  . Nitroglycerin Other (See Comments)    CAUSED NUMBNESS ALL OVER  . Doxycycline Itching  . Flexeril [Cyclobenzaprine] Other (See Comments)    Sweating, Lightheaded   . Ibuprofen Other (See Comments)    Increase BP, dizziness, lightheaded  . Tramadol Itching  . Tylenol [Acetaminophen] Other (See Comments)    Pt has Hep C  . Vesicare [Solifenacin] Other (See Comments)    Makes my sweat turn yellow  . Amoxil [Amoxicillin] Nausea Only    Patient Measurements: Height: 5\' 9"  (175.3 cm) Weight: 240 lb 9.6 oz (109.135 kg) IBW/kg (Calculated) : 66.2 Heparin Dosing Weight: 90 kg  Vital Signs: Temp: 97.4 F (36.3 C) (01/17 0730) Temp src: Axillary (01/17 0730) BP: 115/71 mmHg (01/17 0600) Pulse Rate: 75 (01/17 1108)  Labs:  Recent Labs  12/22/13 2320 12/23/13 0504 12/23/13 1006  HGB 13.5 13.5  --   HCT 39.1 39.0  --   PLT 166 167  --   LABPROT 15.2  --   --   INR 1.23  --   --   HEPARINUNFRC  --   --  0.18*  CREATININE 0.75 0.75  --   TROPONINI 0.45* 0.40* 0.35*    Estimated Creatinine Clearance: 112 ml/min (by C-G formula based on Cr of 0.75).   Medical History: Past Medical History  Diagnosis Date  . Pacemaker     vent paced  . COPD (chronic obstructive pulmonary disease)   . CHF (congestive heart failure)   . Thyroid disease   . Hepatitis     Hep C  . MI (myocardial infarction)   . Pneumonia   . Dysrhythmia     atrial fibrilation  . Cirrhosis   . Shortness of breath   . Diabetes mellitus   . Chest pain   . GERD (gastroesophageal reflux disease)   . Headache(784.0)   . Anxiety   . Permanent atrial fibrillation   . Chronic pain   . PE (pulmonary embolism)   . Hepatitis C     Medications:  Scheduled:  . aspirin  81 mg Oral Daily  . atorvastatin  80 mg Oral QHS  . budesonide-formoterol   2 puff Inhalation BID  . carvedilol  6.25 mg Oral BID WC  . furosemide  20 mg Intravenous Q12H  . ipratropium-albuterol  3 mL Nebulization TID  . methylPREDNISolone (SOLU-MEDROL) injection  60 mg Intravenous Q6H  . potassium chloride  40 mEq Oral Daily  . QUEtiapine  50 mg Oral BID  . sertraline  50 mg Oral Daily  . sodium chloride  3 mL Intravenous Q12H  . traZODone  100 mg Oral QHS    Assessment: 48 yo F on chronic warfarin for Afib & hx of PE.  INR was sub-therapeutic on admission.  Patient is drowsy at this time & unable to recall her warfarin dose (she was previously taking 4mg  daily).  She also states she was suppose to be giving herself Lovenox injections, but never got this filled.   Patient was started on heparin infusion by EDP on admission.  Heparin level is sub-therapeutic. No bleeding noted.   Goal of Therapy:  INR 2-3 Heparin level 0.3-0.7 units/ml Monitor platelets by anticoagulation protocol: Yes   Plan:  Increase heparin infusion to 1300 units/hr Recheck 6 hr heparin level Daily heparin level & CBC while on heparin infusion Coumadin 5mg   po x1 dose today Daily INR F/U with patient tomorrow re: Coumadin dose in hopes that she is more alert  Kim [name], Kim [name] 12/23/2013,12:29 PM  Repeat heparin level still below goal.   Rebolus heparin 2000 units & increase infusion rate 1500 units/hr F/U heparin level & CBc in am, F/U FOBT results.

## 2013-12-23 NOTE — H&P (Signed)
PCP:   Provider Not In System   Chief Complaint:  Chest pain  HPI:  48 year old female who  has a past medical history of Pacemaker; COPD (chronic obstructive pulmonary disease); CHF (congestive heart failure); Thyroid disease; Hepatitis; MI (myocardial infarction); Pneumonia; Dysrhythmia; Cirrhosis; Shortness of breath; Diabetes mellitus; Chest pain; GERD (gastroesophageal reflux disease); Headache(784.0); Anxiety; Permanent atrial fibrillation; Chronic pain; PE (pulmonary embolism); and Hepatitis C.  Today presented to the ED with chest pain. Patient was recently discharged from home hospital after she presented with similar presentation 10 days ago. At that time she had to transfer to [facility] as she had mild elevation of troponin. Patient was seen by cardiology and they did not recommend any further intervention as patient has known history of nonobstructive CAD, has chronic mild elevation of troponin, reproducible chest pain and mild fluid overload which could have contributed to elevated troponin.  As per patient she started having symptoms of shortness of breath and chest pain 2 days after  discharge. She rates pain as 9/10 in intensity, persistent with intermittent radiation to right shoulder. She also has been having nausea but no vomiting. She also complains of nonbloody diarrhea with black colored stools over the past few days. Patient has COPD and is a smoker. She denies fever no dysuria urgency or frequency of urination. Patient has a history of PE and is currently on anticoagulation, today INR is subtherapeutic. Patient has been started on heparin infusion as per ED physician.   Allergies:   Allergies  Allergen Reactions  . Nitroglycerin Other (See Comments)    CAUSED NUMBNESS ALL OVER  . Doxycycline Itching  . Flexeril [Cyclobenzaprine] Other (See Comments)    Sweating, Lightheaded   . Ibuprofen Other (See Comments)    Increase BP, dizziness, lightheaded  . Tramadol Itching   . Tylenol [Acetaminophen] Other (See Comments)    Pt has Hep C  . Vesicare [Solifenacin] Other (See Comments)    Makes my sweat turn yellow  . Amoxil [Amoxicillin] Nausea Only      Past Medical History  Diagnosis Date  . Pacemaker     vent paced  . COPD (chronic obstructive pulmonary disease)   . CHF (congestive heart failure)   . Thyroid disease   . Hepatitis     Hep C  . MI (myocardial infarction)   . Pneumonia   . Dysrhythmia     atrial fibrilation  . Cirrhosis   . Shortness of breath   . Diabetes mellitus   . Chest pain   . GERD (gastroesophageal reflux disease)   . Headache(784.0)   . Anxiety   . Permanent atrial fibrillation   . Chronic pain   . PE (pulmonary embolism)   . Hepatitis C     Past Surgical History  Procedure Laterality Date  . Cholecystectomy    . Pacemaker insertion  02/2012    Biotronik PPM     Prior to Admission medications   Medication Sig Start Date End Date Taking? Authorizing Provider  albuterol (PROVENTIL HFA;VENTOLIN HFA) 108 (90 BASE) MCG/ACT inhaler Inhale 2 puffs into the lungs every 6 (six) hours as needed for wheezing or shortness of breath.    Yes Historical Provider, MD  aspirin 81 MG chewable tablet Chew 1 tablet (81 mg total) by mouth daily. 12/17/13  Yes Thurnell Lose, MD  atorvastatin (LIPITOR) 80 MG tablet Take 80 mg by mouth at bedtime.   Yes Historical Provider, MD  budesonide-formoterol (SYMBICORT) 80-4.5 MCG/ACT inhaler Inhale 2  puffs into the lungs 2 (two) times daily.   Yes Historical Provider, MD  carvedilol (COREG) 6.25 MG tablet Take 6.25 mg by mouth 2 (two) times daily with a meal.   Yes Historical Provider, MD  esomeprazole (NEXIUM) 40 MG capsule Take 40 mg by mouth daily before breakfast.   Yes Historical Provider, MD  furosemide (LASIX) 80 MG tablet Take 80 mg by mouth 2 (two) times daily.   Yes Historical Provider, MD  potassium chloride (K-DUR,KLOR-CON) 10 MEQ tablet Take 40 mEq by mouth daily.   Yes  Historical Provider, MD  QUEtiapine (SEROQUEL) 25 MG tablet Take 50 mg by mouth 2 (two) times daily. 12/08/13  Yes Historical Provider, MD  sertraline (ZOLOFT) 50 MG tablet Take 50 mg by mouth daily.   Yes Historical Provider, MD  tiotropium (SPIRIVA) 18 MCG inhalation capsule Place 18 mcg into inhaler and inhale daily.   Yes Historical Provider, MD  traZODone (DESYREL) 100 MG tablet Take 1 tablet (100 mg total) by mouth at bedtime. 10/11/13  Yes Vance Gather, MD  warfarin (COUMADIN) 4 MG tablet Take 4 mg by mouth daily. 12/08/13 12/08/14 Yes Historical Provider, MD  azithromycin (ZITHROMAX) 250 MG tablet Take 1 tablet (250 mg total) by mouth daily. 12/17/13   Thurnell Lose, MD  enoxaparin (LOVENOX) 120 MG/0.8ML injection Inject 0.7 mLs (105 mg total) into the skin every 12 (twelve) hours. 12/17/13   Thurnell Lose, MD  enoxaparin (LOVENOX) 150 MG/ML injection 135m SQ BID 12/17/13   PThurnell Lose MD  predniSONE (DELTASONE) 5 MG tablet Label  & dispense according to the schedule below. 10 Pills PO for 3 days then, 8 Pills PO for 3 days, 6 Pills PO for 3 days, 4 Pills PO for 3 days, 2 Pills PO for 3 days, 1 Pills PO for 3 days, 1/2 Pill  PO for 3 days then STOP. Total 95 pills. 12/17/13   PThurnell Lose MD    Social History:  reports that she has been smoking Cigarettes.  She has a 15 pack-year smoking history. Her smokeless tobacco use includes Snuff. She reports that she does not drink alcohol or use illicit drugs.  Family History  Problem Relation Age of Onset  . Coronary artery disease Father 674 . Coronary artery disease Mother 714 . Coronary artery disease Brother      All the positives are listed in BOLD  Review of Systems:  HEENT: Headache, blurred vision, runny nose, sore throat Neck: Hypothyroidism, hyperthyroidism,,lymphadenopathy Chest : Shortness of breath, history of COPD, Asthma Heart : Chest pain, history of coronary arterey disease GI:  Nausea, vomiting, diarrhea,  constipation, GERD GU: Dysuria, urgency, frequency of urination, hematuria Neuro: Stroke, seizures, syncope Psych: Depression, anxiety, hallucinations   Physical Exam: Blood pressure 144/80, pulse 97, temperature 97.9 F (36.6 C), temperature source Oral, resp. rate 20, height '5\' 9"'  (1.753 m), weight 108.863 kg (240 lb), last menstrual period 02/08/2007, SpO2 98.00%. Constitutional:   Patient is a well-developed and well-nourished female in no acute distress and cooperative with exam. Head: Normocephalic and atraumatic Mouth: Mucus membranes moist Eyes: PERRL, EOMI, conjunctivae normal Neck: Supple, No Thyromegaly Cardiovascular: RRR, S1 normal, S2 normal Pulmonary/Chest: Scattered rhonchi bilaterally him Abdominal: Soft. Non-tender, non-distended, bowel sounds are normal, no masses, organomegaly, or guarding present.  Neurological: A&O x3, Strenght is normal and symmetric bilaterally, cranial nerve II-XII are grossly intact, no focal motor deficit, sensory intact to light touch bilaterally.  Extremities : Trace edema of the  lower extremities   Labs on Admission:  Results for orders placed during the hospital encounter of 12/22/13 (from the past 48 hour(s))  CBC     Status: Abnormal   Collection Time    12/22/13 11:20 PM      Result Value Range   WBC 7.9  4.0 - 10.5 K/uL   RBC 3.84 (*) 3.87 - 5.11 MIL/uL   Hemoglobin 13.5  12.0 - 15.0 g/dL   HCT 39.1  36.0 - 46.0 %   MCV 101.8 (*) 78.0 - 100.0 fL   MCH 35.2 (*) 26.0 - 34.0 pg   MCHC 34.5  30.0 - 36.0 g/dL   RDW 15.3  11.5 - 15.5 %   Platelets 166  150 - 400 K/uL  TROPONIN I     Status: Abnormal   Collection Time    12/22/13 11:20 PM      Result Value Range   Troponin I 0.45 (*) <0.30 ng/mL   Comment:            Due to the release kinetics of cTnI,     a negative result within the first hours     of the onset of symptoms does not rule out     myocardial infarction with certainty.     If myocardial infarction is still  suspected,     repeat the test at appropriate intervals.     CRITICAL RESULT CALLED TO, READ BACK BY AND VERIFIED WITH:      MOORE,S @ 0000 ON 12/23/13 BY WOODIE,J  PRO B NATRIURETIC PEPTIDE     Status: Abnormal   Collection Time    12/22/13 11:20 PM      Result Value Range   Pro B Natriuretic peptide (BNP) 1579.0 (*) 0 - 125 pg/mL  PROTIME-INR     Status: None   Collection Time    12/22/13 11:20 PM      Result Value Range   Prothrombin Time 15.2  11.6 - 15.2 seconds   INR 1.23  0.00 - 1.49  COMPREHENSIVE METABOLIC PANEL     Status: Abnormal   Collection Time    12/22/13 11:20 PM      Result Value Range   Sodium 143  137 - 147 mEq/L   Potassium 3.7  3.7 - 5.3 mEq/L   Chloride 100  96 - 112 mEq/L   CO2 33 (*) 19 - 32 mEq/L   Glucose, Bld 123 (*) 70 - 99 mg/dL   BUN 10  6 - 23 mg/dL   Creatinine, Ser 0.75  0.50 - 1.10 mg/dL   Calcium 8.8  8.4 - 10.5 mg/dL   Total Protein 7.1  6.0 - 8.3 g/dL   Albumin 3.2 (*) 3.5 - 5.2 g/dL   AST 29  0 - 37 U/L   ALT 29  0 - 35 U/L   Alkaline Phosphatase 67  39 - 117 U/L   Total Bilirubin 0.6  0.3 - 1.2 mg/dL   GFR calc non Af Amer >90  >90 mL/min   GFR calc Af Amer >90  >90 mL/min   Comment: (NOTE)     The eGFR has been calculated using the CKD EPI equation.     This calculation has not been validated in all clinical situations.     eGFR's persistently <90 mL/min signify possible Chronic Kidney     Disease.    Radiological Exams on Admission: Dg Chest Portable 1 View  12/22/2013   CLINICAL DATA:  Chest  pain, shortness of breath and wheezing.  EXAM: PORTABLE CHEST - 1 VIEW  COMPARISON:  Chest radiograph performed 12/16/2013  FINDINGS: The lungs are well-aerated and clear. There is no evidence of focal opacification, pleural effusion or pneumothorax.  The cardiomediastinal silhouette is borderline normal in size. Mild vascular congestion is noted. A pacemaker is noted overlying the left chest wall, with leads ending overlying the right atrium  and right ventricle. No acute osseous abnormalities are seen.  IMPRESSION: Mild vascular congestion noted; lungs remain grossly clear.   Electronically Signed   By: Garald Balding M.D.   On: 12/22/2013 23:53    Assessment/Plan Principal Problem:   Chest pain Active Problems:   COPD (chronic obstructive pulmonary disease)   Acute on chronic combined systolic and diastolic heart failure   Pacemaker   CHF (congestive heart failure)  Chest pain Today patient again has reproducible chest pain, and has a very similar presentation as she had 10 days ago. Cardiology had seen the patient and did not recommend further workup at this time except to manage her symptoms of heart failure. Will obtain records from [facility] the patient had cardiac cath 4 months ago. Her last cardiac cath in 2013 showed nonobstructive CAD. We'll continue the aspirin and Lipitor.  Dyspnea Seems to multifactorial, she has elevated BNP of 1500 and most likely has COPD exacerbation as patient  continues to smoke. We'll start Lasix 20 mg IV every 12 hours, Solu-Medrol 60 mg IV every 6 hours, DuoNeb nebulizers every 6 hours  History of pulmonary embolism Patient has been started on heparin infusion as the PT/INR is subtherapeutic. We'll get pharmacy to dose the Coumadin.  Diarrhea Patient has ongoing black colored stools will obtain stool for occult blood and stool for C. Difficile  COPD exacerbation As above she was started on Solu-Medrol, DuoNeb's.  Atrial fibrillation Patient is on anticoagulation with heparin, will continue Coreg for rate control.   Code status: Patient is full code  Family discussion: No family at bedside   Time Spent on Admission: 96 minutes  Dwaine Pringle S [name] Pager: [phone] 12/23/2013, 1:07 AM  If 7PM-7AM, please contact night-coverage  [email or web address]  Password [email or web address]

## 2013-12-23 NOTE — Progress Notes (Signed)
Nutrition Brief Note  Patient identified on the Malnutrition Screening Tool (MST) Report  Wt Readings from Last 15 Encounters:  12/23/13 240 lb 9.6 oz (109.135 kg)  12/17/13 232 lb 5.8 oz (105.4 kg)  10/11/13 224 lb 6.9 oz (101.8 kg)  08/23/13 210 lb (95.255 kg)  07/16/13 241 lb 12.8 oz (109.68 kg)  07/08/13 230 lb (104.327 kg)  04/15/13 223 lb (101.152 kg)  03/28/13 235 lb (106.595 kg)  03/05/13 230 lb (104.327 kg)  02/20/13 235 lb 10.8 oz (106.9 kg)  02/11/13 219 lb 1.6 oz (99.383 kg)  12/22/12 224 lb (101.606 kg)  08/29/12 243 lb (110.224 kg)  07/21/12 243 lb 9.7 oz (110.5 kg)  07/21/12 243 lb 9.7 oz (110.5 kg)   Pt admitted for CHF and chest discomfort. Noted progressive increase in weight over the past year, likely due to volume overload. Pt is currently on Lasix; wt loss during hospitalization expected.   Body mass index is 35.51 kg/(m^2). Patient meets criteria for obesity, class II based on current BMI.   Current diet order is carb modified, patient is consuming approximately 100% of meals at this time. Labs and medications reviewed.   No nutrition interventions warranted at this time. If nutrition issues arise, please consult RD.   Marshall Kampf A. Mayford Knife, RD, LDN Pager: [phone]

## 2013-12-23 NOTE — Progress Notes (Signed)
This patient was admitted to the hospital earlier this morning by Dr. Sharl Ma.  Patient seen and examined, database reviewed  She reports having constant, continued chest pain for the past 2-3 weeks. She says that her shortness of breath maybe mildly improved, but she still short of breath. She does admit to recently having dark-colored stools. Her last bowel movements was prior to admission. On my arrival, patient is sleeping, keep her eyes closed during my visit, it appears to be somewhat sedated. She does not have any evidence of ongoing wheezing. She does have some minor crackles at bases and evidence of lower extremity edema. We'll continue her on IV Lasix, wean down Solu-Medrol, continue nebulizer treatments. She is on heparin infusion for history of pulmonary embolus as well as atrial fibrillation. We will start the patient on Coumadin per pharmacy. Her mildly elevated troponin may be related to volume overload issues. She does not have any typical symptoms of angina. Her pain is reproducible. Cardiac enzymes are otherwise addendum. EKG is ventricular paced. Patient to be transferred to telemetry. We will check stool for occult blood and C. difficile is also been ordered. Her hemoglobin is currently normal. There does not appear to be any gross evidence of bleeding. Blood sugars were elevated, likely due to history of diabetes and concurrent steroids. We'll start the patient on sliding scale insulin.  Rayane Gallardo

## 2013-12-23 NOTE — Progress Notes (Signed)
Pt transferred to room 309, report was given to Davy Pique, RN prior to transfer. Notified Dr. Kerry Hough and pts new nurse that pt only had 300cc of dark, malodorous urine output this shift. Sheryn Bison

## 2013-12-24 LAB — CBC
HCT: 37.3 % (ref 36.0–46.0)
Hemoglobin: 12.8 g/dL (ref 12.0–15.0)
MCH: 34.6 pg — ABNORMAL HIGH (ref 26.0–34.0)
MCHC: 34.3 g/dL (ref 30.0–36.0)
MCV: 100.8 fL — ABNORMAL HIGH (ref 78.0–100.0)
Platelets: 171 10*3/uL (ref 150–400)
RBC: 3.7 MIL/uL — ABNORMAL LOW (ref 3.87–5.11)
RDW: 15 % (ref 11.5–15.5)
WBC: 17.5 10*3/uL — ABNORMAL HIGH (ref 4.0–10.5)

## 2013-12-24 LAB — GLUCOSE, CAPILLARY
GLUCOSE-CAPILLARY: 310 mg/dL — AB (ref 70–99)
Glucose-Capillary: 197 mg/dL — ABNORMAL HIGH (ref 70–99)
Glucose-Capillary: 281 mg/dL — ABNORMAL HIGH (ref 70–99)
Glucose-Capillary: 386 mg/dL — ABNORMAL HIGH (ref 70–99)

## 2013-12-24 LAB — URINALYSIS, ROUTINE W REFLEX MICROSCOPIC
Bilirubin Urine: NEGATIVE
GLUCOSE, UA: 250 mg/dL — AB
Hgb urine dipstick: NEGATIVE
KETONES UR: NEGATIVE mg/dL
Leukocytes, UA: NEGATIVE
NITRITE: NEGATIVE
Protein, ur: NEGATIVE mg/dL
Specific Gravity, Urine: 1.01 (ref 1.005–1.030)
Urobilinogen, UA: 0.2 mg/dL (ref 0.0–1.0)
pH: 6 (ref 5.0–8.0)

## 2013-12-24 LAB — BASIC METABOLIC PANEL
BUN: 15 mg/dL (ref 6–23)
CO2: 27 meq/L (ref 19–32)
CREATININE: 0.62 mg/dL (ref 0.50–1.10)
Calcium: 8.7 mg/dL (ref 8.4–10.5)
Chloride: 100 mEq/L (ref 96–112)
GFR calc Af Amer: >90 mL/min (ref >90)
GFR calc non Af Amer: >90 mL/min (ref >90)
Glucose, Bld: 216 mg/dL — ABNORMAL HIGH (ref 70–99)
Potassium: 4.3 mEq/L (ref 3.7–5.3)
Sodium: 138 mEq/L (ref 137–147)

## 2013-12-24 LAB — PROTIME-INR
INR: 1.29 (ref 0.00–1.49)
PROTHROMBIN TIME: 15.8 s — AB (ref 11.6–15.2)

## 2013-12-24 LAB — HEPARIN LEVEL (UNFRACTIONATED): HEPARIN UNFRACTIONATED: 0.44 [IU]/mL (ref 0.30–0.70)

## 2013-12-24 MED ORDER — DIPHENHYDRAMINE HCL 25 MG PO CAPS: 25.0000 mg | ORAL_CAPSULE | Freq: Four times a day (QID) | ORAL | Status: DC | PRN

## 2013-12-24 MED ORDER — WARFARIN SODIUM 5 MG PO TABS
5.0000 mg | ORAL_TABLET | Freq: Once | ORAL | Status: AC
  Administered 2013-12-24: 5 mg via ORAL
  Filled 2013-12-24: qty 1

## 2013-12-24 MED ORDER — ENOXAPARIN SODIUM 100 MG/ML ~~LOC~~ SOLN
100.0000 mg | Freq: Two times a day (BID) | SUBCUTANEOUS | Status: DC
  Administered 2013-12-24 – 2013-12-25 (×3): 100 mg via SUBCUTANEOUS
  Filled 2013-12-24 (×3): qty 1

## 2013-12-24 NOTE — Progress Notes (Signed)
[name] PROGRESS NOTE  Kim ELLICK LPN:[id number] DOB: 08/15/1966 DOA: 12/22/2013 PCP: Provider Not In System  Assessment/Plan: 1. Acute on chronic respiratory failure. Likely related to congestive heart failure. Appears to be improving. Patient is chronically on 2-3 L of oxygen. 2. Elevated troponin, likely related to congestive heart failure. Trending down. 3. Acute on chronic systolic congestive heart failure. Ejection fraction of 40-45%. Improved with IV Lasix. Will likely need one more day of IV Lasix and will transition to by mouth tomorrow. Followup with her primary cardiologist in [place]. 4. COPD. No evidence of wheezing. Continue nebulizer treatments. 5. Chest pain, atypical, likely musculoskeletal. Followup with primary cardiologist for further evaluation as needed. 6. History of PE and atrial fibrillation on chronic anticoagulation. Currently on Lovenox and Coumadin. We'll likely discharge him on the same. No evidence of bleeding. 7. Diarrhea. Concern for C. difficile and melena prior to admission. Patient has not had any bowel movement since admission and therefore infectious diarrhea versus melena is unlikely. 8. Leukocytosis, steroid-induced.  Code Status: Full code Family Communication: Discussed with patient Disposition Plan: Probable discharge home tomorrow   Consultants:    Procedures:    Antibiotics:    HPI/Subjective: "I don't feel well". Feels that breathing is better than admission. Has occasional chest pain with cough.  Objective: Filed Vitals:   12/24/13 1448  BP: 135/85  Pulse: 75  Temp: 97.3 F (36.3 C)  Resp: 20    Intake/Output Summary (Last 24 hours) at 12/24/13 2003 Last data filed at 12/24/13 1800  Gross per 24 hour  Intake   1080 ml  Output   2150 ml  Net  -1070 ml   Filed Weights   12/22/13 2235 12/23/13 0207 12/23/13 0624  Weight: 108.863 kg (240 lb) 109.2 kg (240 lb 11.9 oz) 109.135 kg (240 lb 9.6 oz)     Exam:   General:  No acute distress sitting up in chair  Cardiovascular: S1, S2, regular rhythm  Respiratory: Clear to auscultation bilaterally  Abdomen: Soft, nontender, positive bowel sounds  Musculoskeletal: Trace edema bilaterally   Data Reviewed: Basic Metabolic Panel:  Recent Labs Lab 12/22/13 2320 12/23/13 0504 12/24/13 0607  NA 143 140 138  K 3.7 3.8 4.3  CL 100 99 100  CO2 33* 28 27  GLUCOSE 123* 274* 216*  BUN 10 11 15   CREATININE 0.75 0.75 0.62  CALCIUM 8.8 8.5 8.7   Liver Function Tests:  Recent Labs Lab 12/22/13 2320 12/23/13 0504  AST 29 35  ALT 29 31  ALKPHOS 67 66  BILITOT 0.6 0.7  PROT 7.1 7.1  ALBUMIN 3.2* 3.3*   No results found for this basename: LIPASE, AMYLASE,  in the last 168 hours No results found for this basename: AMMONIA,  in the last 168 hours CBC:  Recent Labs Lab 12/22/13 2320 12/23/13 0504 12/24/13 0607  WBC 7.9 8.0 17.5*  HGB 13.5 13.5 12.8  HCT 39.1 39.0 37.3  MCV 101.8* 101.8* 100.8*  PLT 166 167 171   Cardiac Enzymes:  Recent Labs Lab 12/22/13 2320 12/23/13 0504 12/23/13 1006 12/23/13 1720  TROPONINI 0.45* 0.40* 0.35* 0.32*   BNP (last 3 results)  Recent Labs  07/16/13 0100 12/14/13 2055 12/22/13 2320  PROBNP 1656.0* 1144.0* 1579.0*   CBG:  Recent Labs Lab 12/23/13 1626 12/23/13 2202 12/24/13 0759 12/24/13 1148 12/24/13 1706  GLUCAP 263* 339* 197* 310* 281*    Recent Results (from the past 240 hour(s))  MRSA PCR SCREENING     Status:  Abnormal   Collection Time    12/15/13  1:49 AM      Result Value Range Status   MRSA by PCR POSITIVE (*) NEGATIVE Final   Comment:            The GeneXpert MRSA Assay (FDA     approved for NASAL specimens     only), is one component of a     comprehensive MRSA colonization     surveillance program. It is not     intended to diagnose MRSA     infection nor to guide or     monitor treatment for     MRSA infections.     RESULT CALLED TO, READ  BACK BY AND VERIFIED WITH:     CALLED TO RN TINA SAVAGE [phone] @0655  THANEY     Studies: Dg Chest Portable 1 View  12/22/2013   CLINICAL DATA:  Chest pain, shortness of breath and wheezing.  EXAM: PORTABLE CHEST - 1 VIEW  COMPARISON:  Chest radiograph performed 12/16/2013  FINDINGS: The lungs are well-aerated and clear. There is no evidence of focal opacification, pleural effusion or pneumothorax.  The cardiomediastinal silhouette is borderline normal in size. Mild vascular congestion is noted. A pacemaker is noted overlying the left chest wall, with leads ending overlying the right atrium and right ventricle. No acute osseous abnormalities are seen.  IMPRESSION: Mild vascular congestion noted; lungs remain grossly clear.   Electronically Signed   By: Roanna RaiderJeffery  Chang M.D.   On: 12/22/2013 23:53    Scheduled Meds: . aspirin  81 mg Oral Daily  . atorvastatin  80 mg Oral QHS  . budesonide-formoterol  2 puff Inhalation BID  . carvedilol  6.25 mg Oral BID WC  . enoxaparin (LOVENOX) injection  100 mg Subcutaneous Q12H  . furosemide  40 mg Intravenous Q12H  . insulin aspart  0-15 Units Subcutaneous TID WC  . insulin aspart  0-5 Units Subcutaneous QHS  . ipratropium-albuterol  3 mL Nebulization TID  . potassium chloride  40 mEq Oral Daily  . QUEtiapine  50 mg Oral BID  . sertraline  50 mg Oral Daily  . sodium chloride  3 mL Intravenous Q12H  . traZODone  100 mg Oral QHS  . Warfarin - Pharmacist Dosing Inpatient   Does not apply Q24H   Continuous Infusions:   Principal Problem:   Chest pain Active Problems:   COPD (chronic obstructive pulmonary disease)   Acute on chronic combined systolic and diastolic heart failure   Pacemaker   CHF (congestive heart failure)    Time spent: 35 minutes    Kim [name]  [name] Pager [phone]. If 7PM-7AM, please contact night-coverage at [email or web address], password [email or web address] 12/24/2013, 8:03 PM  LOS: 2 days

## 2013-12-24 NOTE — Progress Notes (Addendum)
ANTICOAGULATION CONSULT NOTE - Initial Consult  Pharmacy Consult for Heparin --> Coumadin Indication: atrial fibrillation, hx PE  Allergies  Allergen Reactions  . Nitroglycerin Other (See Comments)    CAUSED NUMBNESS ALL OVER  . Doxycycline Itching  . Flexeril [Cyclobenzaprine] Other (See Comments)    Sweating, Lightheaded   . Ibuprofen Other (See Comments)    Increase BP, dizziness, lightheaded  . Tramadol Itching  . Tylenol [Acetaminophen] Other (See Comments)    Pt has Hep C  . Vesicare [Solifenacin] Other (See Comments)    Makes my sweat turn yellow  . Amoxil [Amoxicillin] Nausea Only    Patient Measurements: Height: 5\' 9"  (175.3 cm) Weight: 240 lb 9.6 oz (109.135 kg) IBW/kg (Calculated) : 66.2 Heparin Dosing Weight: 90 kg  Vital Signs: Temp: 97.3 F (36.3 C) (01/18 0537) Temp src: Oral (01/18 0537) BP: 121/67 mmHg (01/18 0537) Pulse Rate: 73 (01/18 0537)  Labs:  Recent Labs  12/22/13 2320 12/23/13 0504 12/23/13 1006 12/23/13 1720 12/23/13 1920 12/24/13 0607  HGB 13.5 13.5  --   --   --  12.8  HCT 39.1 39.0  --   --   --  37.3  PLT 166 167  --   --   --  171  LABPROT 15.2  --   --   --   --  15.8*  INR 1.23  --   --   --   --  1.29  HEPARINUNFRC  --   --  0.18*  --  0.23* 0.44  CREATININE 0.75 0.75  --   --   --  0.62  TROPONINI 0.45* 0.40* 0.35* 0.32*  --   --     Estimated Creatinine Clearance: 112 ml/min (by C-G formula based on Cr of 0.62).   Medical History: Past Medical History  Diagnosis Date  . Pacemaker     vent paced  . COPD (chronic obstructive pulmonary disease)   . CHF (congestive heart failure)   . Thyroid disease   . Hepatitis     Hep C  . MI (myocardial infarction)   . Pneumonia   . Dysrhythmia     atrial fibrilation  . Cirrhosis   . Shortness of breath   . Diabetes mellitus   . Chest pain   . GERD (gastroesophageal reflux disease)   . Headache(784.0)   . Anxiety   . Permanent atrial fibrillation   . Chronic pain   .  PE (pulmonary embolism)   . Hepatitis C     Medications:  Scheduled:  . aspirin  81 mg Oral Daily  . atorvastatin  80 mg Oral QHS  . budesonide-formoterol  2 puff Inhalation BID  . carvedilol  6.25 mg Oral BID WC  . furosemide  40 mg Intravenous Q12H  . insulin aspart  0-15 Units Subcutaneous TID WC  . insulin aspart  0-5 Units Subcutaneous QHS  . ipratropium-albuterol  3 mL Nebulization TID  . methylPREDNISolone (SOLU-MEDROL) injection  60 mg Intravenous Q12H  . potassium chloride  40 mEq Oral Daily  . QUEtiapine  50 mg Oral BID  . sertraline  50 mg Oral Daily  . sodium chloride  3 mL Intravenous Q12H  . traZODone  100 mg Oral QHS  . Warfarin - Pharmacist Dosing Inpatient   Does not apply Q24H    Assessment: 48 yo F on chronic warfarin 4mg  po daily for Afib & hx of PE.  INR was sub-therapeutic on admission.  Patient states she has been compliant  with medication.  Heparin infusion was started until INR therapeutic.  Heparin level is therapeutic today.  INR is rising to goal.  CBC is unchanged, but concern for bleed yesterday noted.  FOBT pending.   Goal of Therapy:  INR 2-3 Heparin level 0.3-0.7 units/ml Monitor platelets by anticoagulation protocol: Yes   Plan:  Continue heparin infusion at 1500 units/hr Daily heparin level & CBC while on heparin infusion Coumadin 5mg  po x1 dose today Daily INR  Gryffin Altice, [facility] 12/24/2013,9:56 AM  Updated orders to change heparin to Lovenox until INR>2. D/C heparin & heparin labs Lovenox 100mg  sq q12h Check CBC on MWF  Junita Push, PharmD, BCPS 12/24/2013@5 :33 PM

## 2013-12-25 DIAGNOSIS — Z91199 Patient's noncompliance with other medical treatment and regimen due to unspecified reason: Secondary | ICD-10-CM

## 2013-12-25 DIAGNOSIS — Z9119 Patient's noncompliance with other medical treatment and regimen: Secondary | ICD-10-CM

## 2013-12-25 LAB — BASIC METABOLIC PANEL
BUN: 20 mg/dL (ref 6–23)
CALCIUM: 8.6 mg/dL (ref 8.4–10.5)
CHLORIDE: 100 meq/L (ref 96–112)
CO2: 31 mEq/L (ref 19–32)
CREATININE: 0.77 mg/dL (ref 0.50–1.10)
Glucose, Bld: 147 mg/dL — ABNORMAL HIGH (ref 70–99)
Potassium: 4.4 mEq/L (ref 3.7–5.3)
Sodium: 140 mEq/L (ref 137–147)

## 2013-12-25 LAB — GLUCOSE, CAPILLARY
GLUCOSE-CAPILLARY: 181 mg/dL — AB (ref 70–99)
Glucose-Capillary: 126 mg/dL — ABNORMAL HIGH (ref 70–99)
Glucose-Capillary: 96 mg/dL (ref 70–99)

## 2013-12-25 LAB — CBC
HCT: 34.5 % — ABNORMAL LOW (ref 36.0–46.0)
Hemoglobin: 11.9 g/dL — ABNORMAL LOW (ref 12.0–15.0)
MCH: 34.8 pg — ABNORMAL HIGH (ref 26.0–34.0)
MCHC: 34.5 g/dL (ref 30.0–36.0)
MCV: 100.9 fL — ABNORMAL HIGH (ref 78.0–100.0)
PLATELETS: 167 10*3/uL (ref 150–400)
RBC: 3.42 MIL/uL — AB (ref 3.87–5.11)
RDW: 15.2 % (ref 11.5–15.5)
WBC: 14.5 10*3/uL — ABNORMAL HIGH (ref 4.0–10.5)

## 2013-12-25 LAB — PROTIME-INR
INR: 1.67 — AB (ref 0.00–1.49)
PROTHROMBIN TIME: 19.2 s — AB (ref 11.6–15.2)

## 2013-12-25 MED ORDER — ENOXAPARIN SODIUM 100 MG/ML ~~LOC~~ SOLN: 100.0000 mg | Freq: Two times a day (BID) | SUBCUTANEOUS | Status: DC

## 2013-12-25 MED ORDER — FUROSEMIDE 80 MG PO TABS: 80.0000 mg | ORAL_TABLET | Freq: Two times a day (BID) | ORAL | Status: DC

## 2013-12-25 MED ORDER — WARFARIN SODIUM 5 MG PO TABS
5.0000 mg | ORAL_TABLET | Freq: Once | ORAL | Status: AC
  Administered 2013-12-25: 5 mg via ORAL
  Filled 2013-12-25: qty 1

## 2013-12-25 MED ORDER — HYDROXYZINE HCL 10 MG PO TABS: 10.0000 mg | ORAL_TABLET | Freq: Three times a day (TID) | ORAL | Status: DC | PRN

## 2013-12-25 MED ORDER — WARFARIN SODIUM 4 MG PO TABS: 4.0000 mg | ORAL_TABLET | Freq: Every day | ORAL | Status: DC

## 2013-12-25 NOTE — Progress Notes (Signed)
ANTICOAGULATION CONSULT NOTE - Initial Consult  Pharmacy Consult for Heparin --> Coumadin Indication: atrial fibrillation, hx PE  Allergies  Allergen Reactions  . Nitroglycerin Other (See Comments)    CAUSED NUMBNESS ALL OVER  . Doxycycline Itching  . Flexeril [Cyclobenzaprine] Other (See Comments)    Sweating, Lightheaded   . Ibuprofen Other (See Comments)    Increase BP, dizziness, lightheaded  . Tramadol Itching  . Tylenol [Acetaminophen] Other (See Comments)    Pt has Hep C  . Vesicare [Solifenacin] Other (See Comments)    Makes my sweat turn yellow  . Amoxil [Amoxicillin] Nausea Only    Patient Measurements: Height: 5\' 9"  (175.3 cm) Weight: 240 lb 9.6 oz (109.135 kg) IBW/kg (Calculated) : 66.2 Heparin Dosing Weight: 90 kg  Vital Signs: Temp: 97.5 F (36.4 C) (01/19 0638) Temp src: Oral (01/19 0638) BP: 117/69 mmHg (01/19 0638) Pulse Rate: 75 (01/19 0638)  Labs:  Recent Labs  12/22/13 2320 12/23/13 0504 12/23/13 1006 12/23/13 1720 12/23/13 1920 12/24/13 0607 12/25/13 0451  HGB 13.5 13.5  --   --   --  12.8 11.9*  HCT 39.1 39.0  --   --   --  37.3 34.5*  PLT 166 167  --   --   --  171 167  LABPROT 15.2  --   --   --   --  15.8* 19.2*  INR 1.23  --   --   --   --  1.29 1.67*  HEPARINUNFRC  --   --  0.18*  --  0.23* 0.44  --   CREATININE 0.75 0.75  --   --   --  0.62 0.77  TROPONINI 0.45* 0.40* 0.35* 0.32*  --   --   --     Estimated Creatinine Clearance: 112 ml/min (by C-G formula based on Cr of 0.77).   Medical History: Past Medical History  Diagnosis Date  . Pacemaker     vent paced  . COPD (chronic obstructive pulmonary disease)   . CHF (congestive heart failure)   . Thyroid disease   . Hepatitis     Hep C  . MI (myocardial infarction)   . Pneumonia   . Dysrhythmia     atrial fibrilation  . Cirrhosis   . Shortness of breath   . Diabetes mellitus   . Chest pain   . GERD (gastroesophageal reflux disease)   . Headache(784.0)   . Anxiety    . Permanent atrial fibrillation   . Chronic pain   . PE (pulmonary embolism)   . Hepatitis C     Medications:  Scheduled:  . aspirin  81 mg Oral Daily  . atorvastatin  80 mg Oral QHS  . budesonide-formoterol  2 puff Inhalation BID  . carvedilol  6.25 mg Oral BID WC  . enoxaparin (LOVENOX) injection  100 mg Subcutaneous Q12H  . furosemide  40 mg Intravenous Q12H  . insulin aspart  0-15 Units Subcutaneous TID WC  . insulin aspart  0-5 Units Subcutaneous QHS  . ipratropium-albuterol  3 mL Nebulization TID  . potassium chloride  40 mEq Oral Daily  . QUEtiapine  50 mg Oral BID  . sertraline  50 mg Oral Daily  . sodium chloride  3 mL Intravenous Q12H  . traZODone  100 mg Oral QHS  . Warfarin - Pharmacist Dosing Inpatient   Does not apply Q24H    Assessment: 48 yo F on chronic warfarin 4mg  po daily for Afib & hx of  PE.  INR was sub-therapeutic on admission.  Patient states she has been compliant with medication.  Heparin infusion was changed to Lovenox yesterday. INR is rising to goal.  CBC is unchanged, but concern for bleed yesterday noted.  FOBT pending.   Goal of Therapy:  INR 2-3 Monitor platelets by anticoagulation protocol: Yes   Plan:  Lovenox 100mg  sq q12h until INR>2 CBC on MWF Coumadin 5mg  po x1 dose today Daily INR  Elson Clan 12/25/2013,11:03 AM

## 2013-12-25 NOTE — Care Management Note (Signed)
    Page 1 of 1   12/25/2013     1:50:33 PM   CARE MANAGEMENT NOTE 12/25/2013  Patient:  Kim [name], Kim [name]   Account Number:  [id number]  Date Initiated:  12/25/2013  Documentation initiated by:  Sharrie Rothman  Subjective/Objective Assessment:   Pt admitted from home with CHF. Pt lives with her son and will return home at discharge. Pt has home O2 with [facility]. Pt has been active with [facility] in the past. Pt has PCP in [place], Dr. Janne Napoleon.     Action/Plan:   Pt would like HH RN at discharge to draw blood. Alroy Bailiff of [facility] is aware and will call results to Dr. Janne Napoleon. HH services to start within 48 hours of discharge. Pt to discharge today and is aware of d/c instructions.   Anticipated DC Date:  12/25/2013   Anticipated DC Plan:  HOME W HOME HEALTH SERVICES      DC Planning Services  CM consult      Choice offered to / List presented to:  C-1 Patient        HH arranged  HH-1 RN      [facility] agency  [facility]   Status of service:  Completed, signed off Medicare Important Message given?   (If response is "NO", the following Medicare IM given date fields will be blank) Date Medicare IM given:   Date Additional Medicare IM given:    Discharge Disposition:  HOME W HOME HEALTH SERVICES  Per UR Regulation:    If discussed at Long Length of Stay Meetings, dates discussed:    Comments:  12/25/13 1350 Arlyss Queen, RN BSN CM

## 2013-12-25 NOTE — Progress Notes (Signed)
Pt is to be discharged home today. Pt is in NAD, IV is out, all paperwork has been reviewed/discussed with patient, and there are no questions/concerns at this time. Assessment is unchanged from this morning. Pt is to be accompanied downstairs by staff and family via wheelchair.  

## 2013-12-25 NOTE — Discharge Summary (Signed)
Physician Discharge Summary  Kim [name] ZOX:[id number] DOB: 1966-04-12 DOA: 12/22/2013  PCP: Provider Not In System  Admit date: 12/22/2013 Discharge date: 12/25/2013  Time spent: 45 minutes  Recommendations for Outpatient Follow-up:  1. Follow up with primary care doctor in 2 weeks 2. Check INR on 1/22 with results sent to primary care doctor, to be drawn by home health care  Discharge Diagnoses:  Principal Problem:   Chest pain Active Problems:   COPD (chronic obstructive pulmonary disease)   Acute on chronic combined systolic and diastolic heart failure   Pacemaker   CHF (congestive heart failure) Acute on chronic respiratory failure Atrial fibrillation History of pulmonary embolus Chronic anticoagulation  Discharge Condition: improved  Diet recommendation: low salt  Filed Weights   12/22/13 2235 12/23/13 0207 12/23/13 0624  Weight: 108.863 kg (240 lb) 109.2 kg (240 lb 11.9 oz) 109.135 kg (240 lb 9.6 oz)    History of present illness:  48 year old female who has a past medical history of Pacemaker; COPD (chronic obstructive pulmonary disease); CHF (congestive heart failure); Thyroid disease; Hepatitis; MI (myocardial infarction); Pneumonia; Dysrhythmia; Cirrhosis; Shortness of breath; Diabetes mellitus; Chest pain; GERD (gastroesophageal reflux disease); Headache(784.0); Anxiety; Permanent atrial fibrillation; Chronic pain; PE (pulmonary embolism); and Hepatitis C.  Today presented to the ED with chest pain. Patient was recently discharged from home hospital after she presented with similar presentation 10 days ago. At that time she had to transfer to [facility] as she had mild elevation of troponin. Patient was seen by cardiology and they did not recommend any further intervention as patient has known history of nonobstructive CAD, has chronic mild elevation of troponin, reproducible chest pain and mild fluid overload which could have contributed to elevated troponin.  As  per patient she started having symptoms of shortness of breath and chest pain 2 days after discharge. She rates pain as 9/10 in intensity, persistent with intermittent radiation to right shoulder. She also has been having nausea but no vomiting. She also complains of nonbloody diarrhea with black colored stools over the past few days. Patient has COPD and is a smoker. She denies fever no dysuria urgency or frequency of urination. Patient has a history of PE and is currently on anticoagulation, today INR is subtherapeutic.   Hospital Course:  This lady was admitted to the hospital with acute on chronic shortness of breath as well as pleuritic chest pain. The patient is in acute on chronic combined congestive heart failure as well as felt to have a mild COPD exacerbation. She initially received IV Lasix, steroids and nebulizer treatments. Her wheezing subsequently resolved concerns were discontinued. She was continued on IV Lasix and has had good diuresis. She appears to be approaching euvolemia. She's been transitioned back to her oral dose of Lasix. She likely has a history of noncompliance which precipitated her symptoms. She is chronically on 2-3 L of oxygen currently does not have any evidence of shortness of breath or wheezing. She is restarted on her anticoagulation. She has had no evidence of GI bleeding or diarrhea for that matter. The patient did have a mild elevation in her troponin which is felt to be related to congestive heart failure. She has been advised to followup with her primary care physician as well as her cardiologist to further discuss treatment of her heart failure. She was restarted on anticoagulation and she will need repeat INR on 1/22 which will be drawn by home health services. The patient is ready for discharge home. The  patient repeatedly asked that narcotic medications or benzodiazepines be prescribed for her chronic pain/chronic anxiety. She has been advised to to discuss these  issues with her primary care doctor and that she would not receive these prescriptions on discharge.  Procedures:  none  Consultations:  none  Discharge Exam: Filed Vitals:   12/25/13 0638  BP: 117/69  Pulse: 75  Temp: 97.5 F (36.4 C)  Resp: 20    General: NAD Cardiovascular: S1, S2 RRR Respiratory: CTA B  Discharge Instructions  Discharge Orders   Future Orders Complete By Expires   Diet - low sodium heart healthy  As directed    Increase activity slowly  As directed        Medication List    STOP taking these medications       azithromycin 250 MG tablet  Commonly known as:  ZITHROMAX     predniSONE 5 MG tablet  Commonly known as:  DELTASONE      TAKE these medications       albuterol 108 (90 BASE) MCG/ACT inhaler  Commonly known as:  PROVENTIL HFA;VENTOLIN HFA  Inhale 2 puffs into the lungs every 6 (six) hours as needed for wheezing or shortness of breath.     aspirin 81 MG chewable tablet  Chew 1 tablet (81 mg total) by mouth daily.     atorvastatin 80 MG tablet  Commonly known as:  LIPITOR  Take 80 mg by mouth at bedtime.     budesonide-formoterol 80-4.5 MCG/ACT inhaler  Commonly known as:  SYMBICORT  Inhale 2 puffs into the lungs 2 (two) times daily.     carvedilol 6.25 MG tablet  Commonly known as:  COREG  Take 6.25 mg by mouth 2 (two) times daily with a meal.     enoxaparin 100 MG/ML injection  Commonly known as:  LOVENOX  Inject 1 mL (100 mg total) into the skin every 12 (twelve) hours.     esomeprazole 40 MG capsule  Commonly known as:  NEXIUM  Take 40 mg by mouth daily before breakfast.     furosemide 80 MG tablet  Commonly known as:  LASIX  Take 1 tablet (80 mg total) by mouth 2 (two) times daily.     hydrOXYzine 10 MG tablet  Commonly known as:  ATARAX/VISTARIL  Take 1 tablet (10 mg total) by mouth 3 (three) times daily as needed.     potassium chloride 10 MEQ tablet  Commonly known as:  K-DUR,KLOR-CON  Take 40 mEq by  mouth daily.     QUEtiapine 25 MG tablet  Commonly known as:  SEROQUEL  Take 50 mg by mouth 2 (two) times daily.     sertraline 50 MG tablet  Commonly known as:  ZOLOFT  Take 50 mg by mouth daily.     tiotropium 18 MCG inhalation capsule  Commonly known as:  SPIRIVA  Place 18 mcg into inhaler and inhale daily.     traZODone 100 MG tablet  Commonly known as:  DESYREL  Take 1 tablet (100 mg total) by mouth at bedtime.     warfarin 4 MG tablet  Commonly known as:  COUMADIN  Take 1 tablet (4 mg total) by mouth daily.       Allergies  Allergen Reactions  . Nitroglycerin Other (See Comments)    CAUSED NUMBNESS ALL OVER  . Doxycycline Itching  . Flexeril [Cyclobenzaprine] Other (See Comments)    Sweating, Lightheaded   . Ibuprofen Other (See Comments)  Increase BP, dizziness, lightheaded  . Tramadol Itching  . Tylenol [Acetaminophen] Other (See Comments)    Pt has Hep C  . Vesicare [Solifenacin] Other (See Comments)    Makes my sweat turn yellow  . Amoxil [Amoxicillin] Nausea Only       Follow-up Information   Follow up with [name] in 2 weeks.       The results of significant diagnostics from this hospitalization (including imaging, microbiology, ancillary and laboratory) are listed below for reference.    Significant Diagnostic Studies: Dg Chest 2 View  12/16/2013   CLINICAL DATA:  COPD, cough  EXAM: CHEST  2 VIEW  COMPARISON:  12/14/2013  FINDINGS: Cardiac shadow is again mildly enlarged. A pacing device is again seen. The lungs are clear bilaterally. No acute bony abnormality is seen.  IMPRESSION: No acute abnormality noted.   Electronically Signed   By: Alcide Clever M.D.   On: 12/16/2013 16:26   Dg Chest 2 View  12/14/2013   CLINICAL DATA:  Fever, cough, congestion.  EXAM: CHEST  2 VIEW  COMPARISON:  10/06/2013.  FINDINGS: Chronic cardiomegaly. Dual-chamber pacer from the left shows no interval displacement. Mitral annular calcification. No infiltrate or  edema. No effusion or pneumothorax.  IMPRESSION: No active cardiopulmonary disease.   Electronically Signed   By: Tiburcio Pea M.D.   On: 12/14/2013 20:56   Dg Chest Portable 1 View  12/22/2013   CLINICAL DATA:  Chest pain, shortness of breath and wheezing.  EXAM: PORTABLE CHEST - 1 VIEW  COMPARISON:  Chest radiograph performed 12/16/2013  FINDINGS: The lungs are well-aerated and clear. There is no evidence of focal opacification, pleural effusion or pneumothorax.  The cardiomediastinal silhouette is borderline normal in size. Mild vascular congestion is noted. A pacemaker is noted overlying the left chest wall, with leads ending overlying the right atrium and right ventricle. No acute osseous abnormalities are seen.  IMPRESSION: Mild vascular congestion noted; lungs remain grossly clear.   Electronically Signed   By: Roanna Raider M.D.   On: 12/22/2013 23:53    Microbiology: No results found for this or any previous visit (from the past 240 hour(s)).   Labs: Basic Metabolic Panel:  Recent Labs Lab 12/22/13 2320 12/23/13 0504 12/24/13 0607 12/25/13 0451  NA 143 140 138 140  K 3.7 3.8 4.3 4.4  CL 100 99 100 100  CO2 33* 28 27 31   GLUCOSE 123* 274* 216* 147*  BUN 10 11 15 20   CREATININE 0.75 0.75 0.62 0.77  CALCIUM 8.8 8.5 8.7 8.6   Liver Function Tests:  Recent Labs Lab 12/22/13 2320 12/23/13 0504  AST 29 35  ALT 29 31  ALKPHOS 67 66  BILITOT 0.6 0.7  PROT 7.1 7.1  ALBUMIN 3.2* 3.3*   No results found for this basename: LIPASE, AMYLASE,  in the last 168 hours No results found for this basename: AMMONIA,  in the last 168 hours CBC:  Recent Labs Lab 12/22/13 2320 12/23/13 0504 12/24/13 0607 12/25/13 0451  WBC 7.9 8.0 17.5* 14.5*  HGB 13.5 13.5 12.8 11.9*  HCT 39.1 39.0 37.3 34.5*  MCV 101.8* 101.8* 100.8* 100.9*  PLT 166 167 171 167   Cardiac Enzymes:  Recent Labs Lab 12/22/13 2320 12/23/13 0504 12/23/13 1006 12/23/13 1720  TROPONINI 0.45* 0.40* 0.35*  0.32*   BNP: BNP (last 3 results)  Recent Labs  07/16/13 0100 12/14/13 2055 12/22/13 2320  PROBNP 1656.0* 1144.0* 1579.0*   CBG:  Recent Labs Lab 12/24/13  1148 12/24/13 1706 12/24/13 2041 12/25/13 0744 12/25/13 1132  GLUCAP 310* 281* 386* 126* 181*       Signed:  Melisha [name]  [name] 12/25/2013, 3:14 PM

## 2013-12-25 NOTE — Progress Notes (Signed)
UR chart review completed.  

## 2014-01-08 ENCOUNTER — Emergency Department (HOSPITAL_COMMUNITY): Payer: Medicaid Other

## 2014-01-08 ENCOUNTER — Observation Stay (HOSPITAL_COMMUNITY)
Admission: EM | Admit: 2014-01-08 | Discharge: 2014-01-11 | Disposition: A | Discharge status: Home / Self Care | Payer: Medicaid Other | Attending: Internal Medicine | Admitting: Internal Medicine

## 2014-01-08 ENCOUNTER — Encounter (HOSPITAL_COMMUNITY): Payer: Self-pay | Admitting: Emergency Medicine

## 2014-01-08 DIAGNOSIS — R52 Pain, unspecified: Secondary | ICD-10-CM

## 2014-01-08 DIAGNOSIS — E876 Hypokalemia: Secondary | ICD-10-CM

## 2014-01-08 DIAGNOSIS — I252 Old myocardial infarction: Secondary | ICD-10-CM | POA: Insufficient documentation

## 2014-01-08 DIAGNOSIS — Z950 Presence of cardiac pacemaker: Secondary | ICD-10-CM | POA: Diagnosis present

## 2014-01-08 DIAGNOSIS — I4891 Unspecified atrial fibrillation: Secondary | ICD-10-CM | POA: Insufficient documentation

## 2014-01-08 DIAGNOSIS — R112 Nausea with vomiting, unspecified: Secondary | ICD-10-CM | POA: Insufficient documentation

## 2014-01-08 DIAGNOSIS — K2270 Barrett's esophagus without dysplasia: Secondary | ICD-10-CM | POA: Insufficient documentation

## 2014-01-08 DIAGNOSIS — I2510 Atherosclerotic heart disease of native coronary artery without angina pectoris: Secondary | ICD-10-CM | POA: Insufficient documentation

## 2014-01-08 DIAGNOSIS — I5022 Chronic systolic (congestive) heart failure: Secondary | ICD-10-CM

## 2014-01-08 DIAGNOSIS — I517 Cardiomegaly: Secondary | ICD-10-CM | POA: Insufficient documentation

## 2014-01-08 DIAGNOSIS — I509 Heart failure, unspecified: Secondary | ICD-10-CM | POA: Insufficient documentation

## 2014-01-08 DIAGNOSIS — Z7901 Long term (current) use of anticoagulants: Secondary | ICD-10-CM | POA: Insufficient documentation

## 2014-01-08 DIAGNOSIS — R079 Chest pain, unspecified: Secondary | ICD-10-CM | POA: Insufficient documentation

## 2014-01-08 DIAGNOSIS — K551 Chronic vascular disorders of intestine: Secondary | ICD-10-CM | POA: Insufficient documentation

## 2014-01-08 DIAGNOSIS — J4489 Other specified chronic obstructive pulmonary disease: Secondary | ICD-10-CM | POA: Insufficient documentation

## 2014-01-08 DIAGNOSIS — R1310 Dysphagia, unspecified: Secondary | ICD-10-CM | POA: Insufficient documentation

## 2014-01-08 DIAGNOSIS — R109 Unspecified abdominal pain: Principal | ICD-10-CM | POA: Insufficient documentation

## 2014-01-08 DIAGNOSIS — K219 Gastro-esophageal reflux disease without esophagitis: Secondary | ICD-10-CM | POA: Insufficient documentation

## 2014-01-08 DIAGNOSIS — R0602 Shortness of breath: Secondary | ICD-10-CM | POA: Insufficient documentation

## 2014-01-08 DIAGNOSIS — E079 Disorder of thyroid, unspecified: Secondary | ICD-10-CM | POA: Insufficient documentation

## 2014-01-08 DIAGNOSIS — E119 Type 2 diabetes mellitus without complications: Secondary | ICD-10-CM | POA: Insufficient documentation

## 2014-01-08 DIAGNOSIS — I428 Other cardiomyopathies: Secondary | ICD-10-CM | POA: Insufficient documentation

## 2014-01-08 DIAGNOSIS — F411 Generalized anxiety disorder: Secondary | ICD-10-CM | POA: Insufficient documentation

## 2014-01-08 DIAGNOSIS — J449 Chronic obstructive pulmonary disease, unspecified: Secondary | ICD-10-CM | POA: Diagnosis present

## 2014-01-08 DIAGNOSIS — B1920 Unspecified viral hepatitis C without hepatic coma: Secondary | ICD-10-CM | POA: Insufficient documentation

## 2014-01-08 LAB — CBC WITH DIFFERENTIAL/PLATELET
Basophils Absolute: 0 10*3/uL (ref 0.0–0.1)
Basophils Relative: 0 % (ref 0–1)
Eosinophils Absolute: 0.1 10*3/uL (ref 0.0–0.7)
Eosinophils Relative: 1 % (ref 0–5)
HCT: 41.1 % (ref 36.0–46.0)
Hemoglobin: 14 g/dL (ref 12.0–15.0)
Lymphocytes Relative: 28 % (ref 12–46)
Lymphs Abs: 2.1 10*3/uL (ref 0.7–4.0)
MCH: 34.5 pg — ABNORMAL HIGH (ref 26.0–34.0)
MCHC: 34.1 g/dL (ref 30.0–36.0)
MCV: 101.2 fL — ABNORMAL HIGH (ref 78.0–100.0)
Monocytes Absolute: 0.8 10*3/uL (ref 0.1–1.0)
Monocytes Relative: 11 % (ref 3–12)
Neutro Abs: 4.3 10*3/uL (ref 1.7–7.7)
Neutrophils Relative %: 60 % (ref 43–77)
Platelets: 212 10*3/uL (ref 150–400)
RBC: 4.06 MIL/uL (ref 3.87–5.11)
RDW: 14.7 % (ref 11.5–15.5)
WBC: 7.2 10*3/uL (ref 4.0–10.5)

## 2014-01-08 LAB — COMPREHENSIVE METABOLIC PANEL
ALBUMIN: 3.2 g/dL — AB (ref 3.5–5.2)
ALK PHOS: 60 U/L (ref 39–117)
ALT: 26 U/L (ref 0–35)
AST: 35 U/L (ref 0–37)
BUN: 9 mg/dL (ref 6–23)
CALCIUM: 8.5 mg/dL (ref 8.4–10.5)
CO2: 29 mEq/L (ref 19–32)
Chloride: 100 mEq/L (ref 96–112)
Creatinine, Ser: 0.91 mg/dL (ref 0.50–1.10)
GFR calc Af Amer: 84 mL/min — ABNORMAL LOW (ref >90)
GFR calc non Af Amer: 73 mL/min — ABNORMAL LOW (ref >90)
Glucose, Bld: 112 mg/dL — ABNORMAL HIGH (ref 70–99)
Potassium: 3.7 mEq/L (ref 3.7–5.3)
SODIUM: 142 meq/L (ref 137–147)
TOTAL PROTEIN: 7.3 g/dL (ref 6.0–8.3)
Total Bilirubin: 0.5 mg/dL (ref 0.3–1.2)

## 2014-01-08 LAB — PRO B NATRIURETIC PEPTIDE: Pro B Natriuretic peptide (BNP): 1022 pg/mL — ABNORMAL HIGH (ref 0–125)

## 2014-01-08 LAB — PROTIME-INR
INR: 2.2 — AB (ref 0.00–1.49)
PROTHROMBIN TIME: 23.7 s — AB (ref 11.6–15.2)

## 2014-01-08 LAB — TROPONIN I: TROPONIN I: 0.32 ng/mL — AB (ref <0.30)

## 2014-01-08 MED ORDER — HYDROCODONE-ACETAMINOPHEN 5-325 MG PO TABS
1.0000 | ORAL_TABLET | Freq: Once | ORAL | Status: AC
  Administered 2014-01-08: 1 via ORAL
  Filled 2014-01-08: qty 1

## 2014-01-08 MED ORDER — ALBUTEROL SULFATE (2.5 MG/3ML) 0.083% IN NEBU
5.0000 mg | INHALATION_SOLUTION | Freq: Once | RESPIRATORY_TRACT | Status: AC
  Administered 2014-01-08: 5 mg via RESPIRATORY_TRACT
  Filled 2014-01-08: qty 6

## 2014-01-08 MED ORDER — IPRATROPIUM BROMIDE 0.02 % IN SOLN
0.5000 mg | Freq: Once | RESPIRATORY_TRACT | Status: AC
  Administered 2014-01-08: 0.5 mg via RESPIRATORY_TRACT
  Filled 2014-01-08: qty 2.5

## 2014-01-08 NOTE — ED Notes (Signed)
Pt c/o increased sob with vomiting and diarrhea since Thursday.

## 2014-01-08 NOTE — ED Provider Notes (Signed)
CSN: [id number]     Arrival date & time 01/08/14  2152 History   This chart was scribed for Benny Lennert, MD by Dorothey Baseman, ED Scribe. This patient was seen in room APA05/APA05 and the patient's care was started at 10:11 PM.    Chief Complaint  Patient presents with  . Shortness of Breath  . Emesis  . Diarrhea   Patient is a 48 y.o. female presenting with shortness of breath. The history is provided by the patient. No language interpreter was used.  Shortness of Breath Severity:  Moderate Onset quality:  Gradual Timing:  Constant Progression:  Unchanged Chronicity:  Chronic Associated symptoms: chest pain (secondary to cough), cough (chronic) and diaphoresis   Associated symptoms: no fever and no rash   Risk factors: hx of PE/DVT and obesity    HPI Comments: Kim [name] is a 48 y.o. female with a history of COPD, CHF, MI, pneumonia, atrial fibrillation, chronic pain, and PE with a pacemaker in place who presents to the Emergency Department complaining of increasing shortness of breath onset a few days ago. Patient reports that she is on 2L of oxygen at home. Patient states that she is here "for my COPD and congestive heart failure." Patient reports some left-sided chest pain secondary to coughing. She reports some associated diaphoresis. She denies fever, chills. Patient is requesting pain medication. Patient also has a history of thyroid disease, Hepatitis C, cirrhosis, DM.   Past Medical History  Diagnosis Date  . Pacemaker     vent paced  . COPD (chronic obstructive pulmonary disease)   . CHF (congestive heart failure)   . Thyroid disease   . Hepatitis     Hep C  . MI (myocardial infarction)   . Pneumonia   . Dysrhythmia     atrial fibrilation  . Cirrhosis   . Shortness of breath   . Diabetes mellitus   . Chest pain   . GERD (gastroesophageal reflux disease)   . Headache(784.0)   . Anxiety   . Permanent atrial fibrillation   . Chronic pain   . PE (pulmonary  embolism)   . Hepatitis C    Past Surgical History  Procedure Laterality Date  . Cholecystectomy    . Pacemaker insertion  02/2012    Biotronik PPM   . Ablation     Family History  Problem Relation Age of Onset  . Coronary artery disease Father 28  . Coronary artery disease Mother 9  . Coronary artery disease Brother    History  Substance Use Topics  . Smoking status: Current Every Day Smoker -- 0.50 packs/day for 30 years    Types: Cigarettes  . Smokeless tobacco: Current User    Types: Snuff  . Alcohol Use: No   OB History   Grav Para Term Preterm Abortions TAB SAB Ect Mult Living                 Review of Systems  Constitutional: Positive for diaphoresis. Negative for fever, chills, appetite change and fatigue.  HENT: Negative for congestion, ear discharge and sinus pressure.   Eyes: Negative for discharge.  Respiratory: Positive for cough (chronic) and shortness of breath.   Cardiovascular: Positive for chest pain (secondary to cough).  Genitourinary: Negative for frequency and hematuria.  Musculoskeletal: Negative for back pain.  Skin: Negative for rash.  Neurological: Negative for seizures.  Psychiatric/Behavioral: Negative for hallucinations.    Allergies  Nitroglycerin; Doxycycline; Flexeril; Ibuprofen; Tramadol; Tylenol; Vesicare; and  Amoxil  Home Medications   Current Outpatient Rx  Name  Route  Sig  Dispense  Refill  . albuterol (PROVENTIL HFA;VENTOLIN HFA) 108 (90 BASE) MCG/ACT inhaler   Inhalation   Inhale 2 puffs into the lungs every 6 (six) hours as needed for wheezing or shortness of breath.          Marland Kitchen aspirin 81 MG chewable tablet   Oral   Chew 1 tablet (81 mg total) by mouth daily.   30 tablet   0   . atorvastatin (LIPITOR) 80 MG tablet   Oral   Take 80 mg by mouth at bedtime.         . budesonide-formoterol (SYMBICORT) 80-4.5 MCG/ACT inhaler   Inhalation   Inhale 2 puffs into the lungs 2 (two) times daily.         .  carvedilol (COREG) 6.25 MG tablet   Oral   Take 6.25 mg by mouth 2 (two) times daily with a meal.         . enoxaparin (LOVENOX) 100 MG/ML injection   Subcutaneous   Inject 1 mL (100 mg total) into the skin every 12 (twelve) hours.   10 Syringe   0   . esomeprazole (NEXIUM) 40 MG capsule   Oral   Take 40 mg by mouth daily before breakfast.         . furosemide (LASIX) 80 MG tablet   Oral   Take 1 tablet (80 mg total) by mouth 2 (two) times daily.   60 tablet   1   . hydrOXYzine (ATARAX/VISTARIL) 10 MG tablet   Oral   Take 1 tablet (10 mg total) by mouth 3 (three) times daily as needed.   30 tablet   0   . potassium chloride (K-DUR,KLOR-CON) 10 MEQ tablet   Oral   Take 40 mEq by mouth daily.         . QUEtiapine (SEROQUEL) 25 MG tablet   Oral   Take 50 mg by mouth 2 (two) times daily.         . sertraline (ZOLOFT) 50 MG tablet   Oral   Take 50 mg by mouth daily.         Marland Kitchen tiotropium (SPIRIVA) 18 MCG inhalation capsule   Inhalation   Place 18 mcg into inhaler and inhale daily.         . traZODone (DESYREL) 100 MG tablet   Oral   Take 1 tablet (100 mg total) by mouth at bedtime.   30 tablet   0   . warfarin (COUMADIN) 4 MG tablet   Oral   Take 1 tablet (4 mg total) by mouth daily.   30 tablet   1    Triage Vitals: BP 113/73  Pulse 88  Temp(Src) 97.5 F (36.4 C) (Oral)  Resp 21  Ht 5\' 9"  (1.753 m)  Wt 240 lb (108.863 kg)  BMI 35.43 kg/m2  SpO2 95%  LMP 02/08/2007  Physical Exam  Constitutional: She is oriented to person, place, and time. She appears well-developed.  HENT:  Head: Normocephalic.  Eyes: Conjunctivae and EOM are normal. No scleral icterus.  Neck: Neck supple. No thyromegaly present.  Cardiovascular: Normal rate and regular rhythm.  Exam reveals no gallop and no friction rub.   No murmur heard. Pulmonary/Chest: No stridor. She has no wheezes. She has no rales. She exhibits no tenderness.  Abdominal: She exhibits no  distension. There is no tenderness. There is no rebound.  Musculoskeletal: Normal range of motion. She exhibits no edema.  Lymphadenopathy:    She has no cervical adenopathy.  Neurological: She is oriented to person, place, and time. She exhibits normal muscle tone. Coordination normal.  Skin: No rash noted. No erythema.  Psychiatric: She has a normal mood and affect. Her behavior is normal.    ED Course  Procedures (including critical care time)  DIAGNOSTIC STUDIES: Oxygen Saturation is 95% on room air, adequate by my interpretation.    COORDINATION OF CARE: 10:15 PM- Ordered a chest x-ray, blood labs, and an EKG. Will order Norco to manage symptoms. Discussed treatment plan with patient at bedside and patient verbalized agreement.     Labs Review Labs Reviewed  CBC WITH DIFFERENTIAL  PRO B NATRIURETIC PEPTIDE  COMPREHENSIVE METABOLIC PANEL  TROPONIN I   Imaging Review Dg Chest Portable 1 View  01/08/2014   CLINICAL DATA:  Shortness of breath.  EXAM: PORTABLE CHEST - 1 VIEW  COMPARISON:  Single view of the chest 12/22/2013.  FINDINGS: Cardiomegaly and mild vascular congestion appear unchanged. No consolidative process, pneumothorax or effusion. Pacing device is in place.  IMPRESSION: No acute finding.  Cardiomegaly and vascular congestion.   Electronically Signed   By: Drusilla Kannerhomas  Dalessio M.D.   On: 01/08/2014 22:39    EKG Interpretation    Date/Time:  Monday January 08 2014 22:53:22 EST Ventricular Rate:  77 PR Interval:    QRS Duration: 186 QT Interval:  498 QTC Calculation: 563 R Axis:   -82 Text Interpretation:  Ventricular-paced rhythm Abnormal ECG When compared with ECG of 08-Jan-2014 22:40, Vent. rate has decreased BY  21 BPM Confirmed by Leaner Morici  MD, Abe Schools ([id number]) on 01/08/2014 11:01:45 PM          Dr. Rito EhrlichKrishnan hospitalist to see pt in ER  MDM  chf mild   Benny LennertJoseph L Genae Strine, MD 01/08/14 2344

## 2014-01-09 ENCOUNTER — Encounter (HOSPITAL_COMMUNITY): Payer: Self-pay | Admitting: Internal Medicine

## 2014-01-09 ENCOUNTER — Emergency Department (HOSPITAL_COMMUNITY): Payer: Medicaid Other

## 2014-01-09 DIAGNOSIS — B1920 Unspecified viral hepatitis C without hepatic coma: Secondary | ICD-10-CM | POA: Diagnosis present

## 2014-01-09 DIAGNOSIS — K920 Hematemesis: Secondary | ICD-10-CM

## 2014-01-09 DIAGNOSIS — R1310 Dysphagia, unspecified: Secondary | ICD-10-CM | POA: Diagnosis present

## 2014-01-09 DIAGNOSIS — R109 Unspecified abdominal pain: Secondary | ICD-10-CM | POA: Diagnosis present

## 2014-01-09 DIAGNOSIS — R112 Nausea with vomiting, unspecified: Secondary | ICD-10-CM

## 2014-01-09 LAB — COMPREHENSIVE METABOLIC PANEL
ALT: 30 U/L (ref 0–35)
AST: 40 U/L — ABNORMAL HIGH (ref 0–37)
Albumin: 3.7 g/dL (ref 3.5–5.2)
Alkaline Phosphatase: 63 U/L (ref 39–117)
BUN: 8 mg/dL (ref 6–23)
CALCIUM: 8.5 mg/dL (ref 8.4–10.5)
CO2: 31 mEq/L (ref 19–32)
CREATININE: 0.9 mg/dL (ref 0.50–1.10)
Chloride: 95 mEq/L — ABNORMAL LOW (ref 96–112)
GFR calc non Af Amer: 74 mL/min — ABNORMAL LOW (ref >90)
GFR, EST AFRICAN AMERICAN: 86 mL/min — AB (ref >90)
GLUCOSE: 72 mg/dL (ref 70–99)
Potassium: 3.3 mEq/L — ABNORMAL LOW (ref 3.7–5.3)
SODIUM: 141 meq/L (ref 137–147)
TOTAL PROTEIN: 8.1 g/dL (ref 6.0–8.3)
Total Bilirubin: 0.7 mg/dL (ref 0.3–1.2)

## 2014-01-09 LAB — CBC
HCT: 44.2 % (ref 36.0–46.0)
HEMOGLOBIN: 14.8 g/dL (ref 12.0–15.0)
MCH: 33.9 pg (ref 26.0–34.0)
MCHC: 33.5 g/dL (ref 30.0–36.0)
MCV: 101.1 fL — ABNORMAL HIGH (ref 78.0–100.0)
Platelets: 191 10*3/uL (ref 150–400)
RBC: 4.37 MIL/uL (ref 3.87–5.11)
RDW: 14.6 % (ref 11.5–15.5)
WBC: 6.1 10*3/uL (ref 4.0–10.5)

## 2014-01-09 LAB — TROPONIN I: Troponin I: <0.3 ng/mL (ref <0.30)

## 2014-01-09 LAB — TSH: TSH: 10.779 u[IU]/mL — ABNORMAL HIGH (ref 0.350–4.500)

## 2014-01-09 LAB — PROTIME-INR
INR: 2.37 — ABNORMAL HIGH (ref 0.00–1.49)
Prothrombin Time: 25.1 seconds — ABNORMAL HIGH (ref 11.6–15.2)

## 2014-01-09 LAB — LIPASE, BLOOD: LIPASE: 53 U/L (ref 11–59)

## 2014-01-09 MED ORDER — ALBUTEROL SULFATE (2.5 MG/3ML) 0.083% IN NEBU
2.5000 mg | INHALATION_SOLUTION | Freq: Four times a day (QID) | RESPIRATORY_TRACT | Status: DC
  Administered 2014-01-09 – 2014-01-11 (×8): 2.5 mg via RESPIRATORY_TRACT
  Filled 2014-01-09 (×10): qty 3

## 2014-01-09 MED ORDER — PANTOPRAZOLE SODIUM 40 MG PO TBEC
40.0000 mg | DELAYED_RELEASE_TABLET | Freq: Two times a day (BID) | ORAL | Status: DC
  Administered 2014-01-09 – 2014-01-11 (×4): 40 mg via ORAL
  Filled 2014-01-09 (×4): qty 1

## 2014-01-09 MED ORDER — SODIUM CHLORIDE 0.9 % IV SOLN
250.0000 mL | INTRAVENOUS | Status: DC | PRN
  Administered 2014-01-10: 250 mL via INTRAVENOUS

## 2014-01-09 MED ORDER — METOPROLOL TARTRATE 25 MG PO TABS
25.0000 mg | ORAL_TABLET | Freq: Three times a day (TID) | ORAL | Status: DC
  Administered 2014-01-09 – 2014-01-11 (×7): 25 mg via ORAL
  Filled 2014-01-09 (×7): qty 1

## 2014-01-09 MED ORDER — ONDANSETRON HCL 4 MG/2ML IJ SOLN
4.0000 mg | Freq: Once | INTRAMUSCULAR | Status: AC
  Administered 2014-01-09: 4 mg via INTRAVENOUS
  Filled 2014-01-09: qty 2

## 2014-01-09 MED ORDER — IOHEXOL 350 MG/ML SOLN
100.0000 mL | Freq: Once | INTRAVENOUS | Status: AC | PRN
  Administered 2014-01-09: 100 mL via INTRAVENOUS

## 2014-01-09 MED ORDER — SODIUM CHLORIDE 0.9 % IJ SOLN
3.0000 mL | INTRAMUSCULAR | Status: DC | PRN
  Administered 2014-01-10: 3 mL via INTRAVENOUS

## 2014-01-09 MED ORDER — TIOTROPIUM BROMIDE MONOHYDRATE 18 MCG IN CAPS
18.0000 ug | ORAL_CAPSULE | Freq: Every day | RESPIRATORY_TRACT | Status: DC
  Administered 2014-01-11: 18 ug via RESPIRATORY_TRACT
  Filled 2014-01-09: qty 5

## 2014-01-09 MED ORDER — ASPIRIN 81 MG PO CHEW
81.0000 mg | CHEWABLE_TABLET | Freq: Every day | ORAL | Status: DC
  Administered 2014-01-09: 81 mg via ORAL
  Filled 2014-01-09: qty 1

## 2014-01-09 MED ORDER — QUETIAPINE FUMARATE 25 MG PO TABS
50.0000 mg | ORAL_TABLET | Freq: Two times a day (BID) | ORAL | Status: DC
  Administered 2014-01-09 – 2014-01-11 (×6): 50 mg via ORAL
  Filled 2014-01-09 (×6): qty 2

## 2014-01-09 MED ORDER — ATORVASTATIN CALCIUM 40 MG PO TABS
80.0000 mg | ORAL_TABLET | Freq: Every day | ORAL | Status: DC
  Administered 2014-01-09 – 2014-01-10 (×2): 80 mg via ORAL
  Filled 2014-01-09 (×2): qty 2

## 2014-01-09 MED ORDER — BUDESONIDE-FORMOTEROL FUMARATE 80-4.5 MCG/ACT IN AERO
2.0000 | INHALATION_SPRAY | Freq: Two times a day (BID) | RESPIRATORY_TRACT | Status: DC
  Administered 2014-01-09 – 2014-01-11 (×3): 2 via RESPIRATORY_TRACT
  Filled 2014-01-09: qty 6.9

## 2014-01-09 MED ORDER — PANTOPRAZOLE SODIUM 40 MG IV SOLR
40.0000 mg | Freq: Once | INTRAVENOUS | Status: AC
  Administered 2014-01-09: 40 mg via INTRAVENOUS
  Filled 2014-01-09: qty 40

## 2014-01-09 MED ORDER — FUROSEMIDE 80 MG PO TABS
80.0000 mg | ORAL_TABLET | Freq: Two times a day (BID) | ORAL | Status: DC
  Administered 2014-01-09 – 2014-01-11 (×5): 80 mg via ORAL
  Filled 2014-01-09 (×5): qty 1

## 2014-01-09 MED ORDER — ALBUTEROL SULFATE (2.5 MG/3ML) 0.083% IN NEBU
2.5000 mg | INHALATION_SOLUTION | RESPIRATORY_TRACT | Status: DC | PRN
  Filled 2014-01-09: qty 3

## 2014-01-09 MED ORDER — ONDANSETRON HCL 4 MG PO TABS
4.0000 mg | ORAL_TABLET | Freq: Four times a day (QID) | ORAL | Status: DC | PRN
Start: 2014-01-09 — End: 2014-01-11

## 2014-01-09 MED ORDER — SODIUM CHLORIDE 0.9 % IJ SOLN
3.0000 mL | Freq: Two times a day (BID) | INTRAMUSCULAR | Status: DC
  Administered 2014-01-09 – 2014-01-11 (×4): 3 mL via INTRAVENOUS

## 2014-01-09 MED ORDER — SODIUM CHLORIDE 0.9 % IJ SOLN
3.0000 mL | Freq: Two times a day (BID) | INTRAMUSCULAR | Status: DC
  Administered 2014-01-10 (×2): 3 mL via INTRAVENOUS

## 2014-01-09 MED ORDER — WARFARIN SODIUM 2 MG PO TABS: 4.0000 mg | ORAL_TABLET | ORAL | Status: DC

## 2014-01-09 MED ORDER — SERTRALINE HCL 50 MG PO TABS
50.0000 mg | ORAL_TABLET | Freq: Every day | ORAL | Status: DC
  Administered 2014-01-09 – 2014-01-11 (×3): 50 mg via ORAL
  Filled 2014-01-09 (×3): qty 1

## 2014-01-09 MED ORDER — CARVEDILOL 3.125 MG PO TABS
6.2500 mg | ORAL_TABLET | Freq: Two times a day (BID) | ORAL | Status: DC
  Administered 2014-01-09: 6.25 mg via ORAL
  Filled 2014-01-09: qty 2

## 2014-01-09 MED ORDER — HYDROMORPHONE HCL PF 1 MG/ML IJ SOLN
0.5000 mg | INTRAMUSCULAR | Status: DC | PRN
  Administered 2014-01-09 – 2014-01-11 (×11): 0.5 mg via INTRAVENOUS
  Filled 2014-01-09 (×12): qty 1

## 2014-01-09 MED ORDER — TRAZODONE HCL 50 MG PO TABS
100.0000 mg | ORAL_TABLET | Freq: Every day | ORAL | Status: DC
  Administered 2014-01-09 – 2014-01-10 (×2): 100 mg via ORAL
  Filled 2014-01-09 (×2): qty 2

## 2014-01-09 MED ORDER — WARFARIN - PHARMACIST DOSING INPATIENT
Status: DC
  Administered 2014-01-10: 16:00:00

## 2014-01-09 MED ORDER — FUROSEMIDE 10 MG/ML IJ SOLN
40.0000 mg | Freq: Once | INTRAMUSCULAR | Status: AC
  Administered 2014-01-09: 40 mg via INTRAVENOUS
  Filled 2014-01-09: qty 4

## 2014-01-09 MED ORDER — HYDROMORPHONE HCL PF 1 MG/ML IJ SOLN
1.0000 mg | Freq: Once | INTRAMUSCULAR | Status: AC
  Administered 2014-01-09: 1 mg via INTRAVENOUS
  Filled 2014-01-09: qty 1

## 2014-01-09 MED ORDER — PANTOPRAZOLE SODIUM 40 MG PO TBEC
40.0000 mg | DELAYED_RELEASE_TABLET | Freq: Every day | ORAL | Status: DC
  Administered 2014-01-09: 40 mg via ORAL
  Filled 2014-01-09: qty 1

## 2014-01-09 MED ORDER — POTASSIUM CHLORIDE CRYS ER 20 MEQ PO TBCR
40.0000 meq | EXTENDED_RELEASE_TABLET | Freq: Every day | ORAL | Status: DC
  Administered 2014-01-09 – 2014-01-11 (×3): 40 meq via ORAL
  Filled 2014-01-09 (×3): qty 2

## 2014-01-09 MED ORDER — ONDANSETRON HCL 4 MG/2ML IJ SOLN
4.0000 mg | Freq: Four times a day (QID) | INTRAMUSCULAR | Status: DC | PRN
  Administered 2014-01-09 – 2014-01-11 (×4): 4 mg via INTRAVENOUS
  Filled 2014-01-09 (×5): qty 2

## 2014-01-09 MED ORDER — HYDROXYZINE HCL 10 MG PO TABS
10.0000 mg | ORAL_TABLET | Freq: Three times a day (TID) | ORAL | Status: DC | PRN
  Administered 2014-01-09: 10 mg via ORAL
  Filled 2014-01-09: qty 1

## 2014-01-09 NOTE — Consult Note (Addendum)
REVIEWED. NV, COFFEE GROUND EMESIS  & DYSPHAGIA MOST LIKEYY DUE TO REFLUX ESOPHAGITIS. PT NEED BID PPI AS OP & TO FOLLOW A LOW FAT DIET. OP EVALUATION BY IR FOR CHRONIC MESENTERIC ISCHEMIA. HOLD COUMADIN. CONSIDER HEPARIN GTT AFTER INR < 2.0. EGD AFTER INR < 2.0. STOP ASPIRIN.

## 2014-01-09 NOTE — H&P (Signed)
[name] History and Physical  Kim [name]:[id number] DOB: 1965-12-29 DOA: 01/08/2014   PCP: Dr. Arsenio Loader in [place] Specialists: She is followed by a cardiologist in [place] as well  Chief Complaint: Nausea, vomiting, abdominal pain since Thursday  HPI: Kim [name] is a 48 y.o. female with a past medical history of atrial fibrillation on anticoagulation, nonobstructive CAD, cardiomyopathy, COPD, who has been hospitalized many times in the last 6 months. Her last admission was in January when she presented with chest pain. Today her main concern is abdominal that started last Thursday. She tells me that whenever she would eat or drink, 30 minutes afterward she would feel abdominal pain in the mid part of her abdomen, and then she would subsequently vomit. She hasn't been able to keep anything down. However, she denies any weight loss. She's also had a few loose stools. She also gets a lot of heartburn despite being on Nexium. She also admits to some difficulty swallowing and feels, as if the food gets stuck in her esophagus. She is on chronic home oxygen. She reports shortness of breath with exertion, but not at rest. Denies any chest pain at this time. Main concern is heartburn. Has leg swelling, but this is chronic for her. She had has had a few stools as well. Denies any blood in the stool. Last colonoscopy was 2 years ago, which revealed a polyp. Based on our record she's had an endoscopy in 2010, by Dr. Deatra Ina, which did not show any concerning findings apart from some erosion. No recent endoscopy in our records. One of the challenges regarding this patient is that she seems to go to different facilities to seek care.  Home Medications: Prior to Admission medications   Medication Sig Start Date End Date Taking? Authorizing Provider  albuterol (PROVENTIL HFA;VENTOLIN HFA) 108 (90 BASE) MCG/ACT inhaler Inhale 2 puffs into the lungs every 6 (six) hours as needed for  wheezing or shortness of breath.     Historical Provider, MD  aspirin 81 MG chewable tablet Chew 1 tablet (81 mg total) by mouth daily. 12/17/13   Thurnell Lose, MD  atorvastatin (LIPITOR) 80 MG tablet Take 80 mg by mouth at bedtime.    Historical Provider, MD  budesonide-formoterol (SYMBICORT) 80-4.5 MCG/ACT inhaler Inhale 2 puffs into the lungs 2 (two) times daily.    Historical Provider, MD  carvedilol (COREG) 6.25 MG tablet Take 6.25 mg by mouth 2 (two) times daily with a meal.    Historical Provider, MD  enoxaparin (LOVENOX) 100 MG/ML injection Inject 1 mL (100 mg total) into the skin every 12 (twelve) hours. 12/25/13   Kathie Dike, MD  esomeprazole (NEXIUM) 40 MG capsule Take 40 mg by mouth daily before breakfast.    Historical Provider, MD  furosemide (LASIX) 80 MG tablet Take 1 tablet (80 mg total) by mouth 2 (two) times daily. 12/25/13   Kathie Dike, MD  hydrOXYzine (ATARAX/VISTARIL) 10 MG tablet Take 1 tablet (10 mg total) by mouth 3 (three) times daily as needed. 12/25/13   Kathie Dike, MD  potassium chloride (K-DUR,KLOR-CON) 10 MEQ tablet Take 40 mEq by mouth daily.    Historical Provider, MD  QUEtiapine (SEROQUEL) 25 MG tablet Take 50 mg by mouth 2 (two) times daily. 12/08/13   Historical Provider, MD  sertraline (ZOLOFT) 50 MG tablet Take 50 mg by mouth daily.    Historical Provider, MD  tiotropium (SPIRIVA) 18 MCG inhalation capsule Place 18 mcg into inhaler and inhale daily.  Historical Provider, MD  traZODone (DESYREL) 100 MG tablet Take 1 tablet (100 mg total) by mouth at bedtime. 10/11/13   Vance Gather, MD  warfarin (COUMADIN) 4 MG tablet Take 1 tablet (4 mg total) by mouth daily. 12/25/13 12/25/14  Kathie Dike, MD    Allergies:  Allergies  Allergen Reactions  . Nitroglycerin Other (See Comments)    CAUSED NUMBNESS ALL OVER  . Doxycycline Itching  . Flexeril [Cyclobenzaprine] Other (See Comments)    Sweating, Lightheaded   . Ibuprofen Other (See Comments)     Increase BP, dizziness, lightheaded  . Tramadol Itching  . Tylenol [Acetaminophen] Other (See Comments)    Pt has Hep C  . Vesicare [Solifenacin] Other (See Comments)    Makes my sweat turn yellow  . Amoxil [Amoxicillin] Nausea Only    Past Medical History: Past Medical History  Diagnosis Date  . Pacemaker     vent paced  . COPD (chronic obstructive pulmonary disease)   . CHF (congestive heart failure)   . Thyroid disease   . Hepatitis     Hep C  . MI (myocardial infarction)   . Pneumonia   . Dysrhythmia     atrial fibrilation  . Cirrhosis   . Shortness of breath   . Diabetes mellitus   . Chest pain   . GERD (gastroesophageal reflux disease)   . Headache(784.0)   . Anxiety   . Permanent atrial fibrillation   . Chronic pain   . PE (pulmonary embolism)   . Hepatitis C     Past Surgical History  Procedure Laterality Date  . Cholecystectomy    . Pacemaker insertion  02/2012    Biotronik PPM   . Ablation      Social History: Patient lives in [place] with her family. Smokes half pack of cigarettes on a daily basis. No alcohol use. No illicit drug use. Independent with daily activities. She gets home health.  Family History:  Family History  Problem Relation Age of Onset  . Coronary artery disease Father 42  . Coronary artery disease Mother 23  . Coronary artery disease Brother      Review of Systems - History obtained from the patient General ROS: positive for  - fatigue Psychological ROS: negative Ophthalmic ROS: negative ENT ROS: negative Allergy and Immunology ROS: negative Hematological and Lymphatic ROS: negative Endocrine ROS: negative Respiratory ROS: chronic shortness of breath Cardiovascular ROS: as in hpi Gastrointestinal ROS: as in hpi Genito-Urinary ROS: no dysuria, trouble voiding, or hematuria Musculoskeletal ROS: negative Neurological ROS: no TIA or stroke symptoms Dermatological ROS: negative  Physical Examination  Filed Vitals:    01/08/14 2201 01/08/14 2257  BP: 113/73   Pulse: 88   Temp: 97.5 F (36.4 C)   TempSrc: Oral   Resp: 21   Height: '5\' 9"'  (1.753 m)   Weight: 108.863 kg (240 lb)   SpO2: 95% 98%    General appearance: alert, cooperative, appears stated age, no distress and moderately obese Head: Normocephalic, without obvious abnormality, atraumatic Eyes: conjunctivae/corneas clear. PERRL, EOM's intact.  Throat: lips, mucosa, and tongue normal; teeth and gums normal Neck: no adenopathy, no carotid bruit, no JVD, supple, symmetrical, trachea midline and thyroid not enlarged, symmetric, no tenderness/mass/nodules Back: symmetric, no curvature. ROM normal. No CVA tenderness. Resp: Few scattered wheezing bilaterally. No crackles. No rhonchi Cardio: regular rate and rhythm, S1, S2 normal, no murmur, click, rub or gallop GI: Abdomen is obese. There is diffuse mild tenderness in the middle part  of the min without any rebound, rigidity, or guarding. No masses, organomegaly. Bowel sounds are present. Extremities: Minimal edema in bilateral lower extremities Pulses: 2+ and symmetric Skin: Skin color, texture, turgor normal. No rashes or lesions Lymph nodes: Cervical, supraclavicular, and axillary nodes normal. Neurologic: Alert and oriented X 3, normal strength and tone. No focal deficits  Laboratory Data: Results for orders placed during the hospital encounter of 01/08/14 (from the past 48 hour(s))  CBC WITH DIFFERENTIAL     Status: Abnormal   Collection Time    01/08/14 10:31 PM      Result Value Range   WBC 7.2  4.0 - 10.5 K/uL   RBC 4.06  3.87 - 5.11 MIL/uL   Hemoglobin 14.0  12.0 - 15.0 g/dL   HCT 41.1  36.0 - 46.0 %   MCV 101.2 (*) 78.0 - 100.0 fL   MCH 34.5 (*) 26.0 - 34.0 pg   MCHC 34.1  30.0 - 36.0 g/dL   RDW 14.7  11.5 - 15.5 %   Platelets 212  150 - 400 K/uL   Neutrophils Relative % 60  43 - 77 %   Neutro Abs 4.3  1.7 - 7.7 K/uL   Lymphocytes Relative 28  12 - 46 %   Lymphs Abs 2.1  0.7 -  4.0 K/uL   Monocytes Relative 11  3 - 12 %   Monocytes Absolute 0.8  0.1 - 1.0 K/uL   Eosinophils Relative 1  0 - 5 %   Eosinophils Absolute 0.1  0.0 - 0.7 K/uL   Basophils Relative 0  0 - 1 %   Basophils Absolute 0.0  0.0 - 0.1 K/uL  PRO B NATRIURETIC PEPTIDE     Status: Abnormal   Collection Time    01/08/14 10:31 PM      Result Value Range   Pro B Natriuretic peptide (BNP) 1022.0 (*) 0 - 125 pg/mL  COMPREHENSIVE METABOLIC PANEL     Status: Abnormal   Collection Time    01/08/14 10:31 PM      Result Value Range   Sodium 142  137 - 147 mEq/L   Potassium 3.7  3.7 - 5.3 mEq/L   Chloride 100  96 - 112 mEq/L   CO2 29  19 - 32 mEq/L   Glucose, Bld 112 (*) 70 - 99 mg/dL   BUN 9  6 - 23 mg/dL   Creatinine, Ser 0.91  0.50 - 1.10 mg/dL   Calcium 8.5  8.4 - 10.5 mg/dL   Total Protein 7.3  6.0 - 8.3 g/dL   Albumin 3.2 (*) 3.5 - 5.2 g/dL   AST 35  0 - 37 U/L   ALT 26  0 - 35 U/L   Alkaline Phosphatase 60  39 - 117 U/L   Total Bilirubin 0.5  0.3 - 1.2 mg/dL   GFR calc non Af Amer 73 (*) >90 mL/min   GFR calc Af Amer 84 (*) >90 mL/min   Comment: (NOTE)     The eGFR has been calculated using the CKD EPI equation.     This calculation has not been validated in all clinical situations.     eGFR's persistently <90 mL/min signify possible Chronic Kidney     Disease.  TROPONIN I     Status: Abnormal   Collection Time    01/08/14 10:31 PM      Result Value Range   Troponin I 0.32 (*) <0.30 ng/mL   Comment:  Due to the release kinetics of cTnI,     a negative result within the first hours     of the onset of symptoms does not rule out     myocardial infarction with certainty.     If myocardial infarction is still suspected,     repeat the test at appropriate intervals.     CRITICAL VALUE NOTED.  VALUE IS CONSISTENT WITH PREVIOUSLY REPORTED AND CALLED VALUE.  PROTIME-INR     Status: Abnormal   Collection Time    01/08/14 11:30 PM      Result Value Range   Prothrombin Time  23.7 (*) 11.6 - 15.2 seconds   INR 2.20 (*) 0.00 - 1.49    Radiology Reports: Dg Chest Portable 1 View  01/08/2014   CLINICAL DATA:  Shortness of breath.  EXAM: PORTABLE CHEST - 1 VIEW  COMPARISON:  Single view of the chest 12/22/2013.  FINDINGS: Cardiomegaly and mild vascular congestion appear unchanged. No consolidative process, pneumothorax or effusion. Pacing device is in place.  IMPRESSION: No acute finding.  Cardiomegaly and vascular congestion.   Electronically Signed   By: Inge Rise M.D.   On: 01/08/2014 22:39    Electrocardiogram: Paced rhythm, with heart rate in the 70s  Problem List  Principal Problem:   Post Prandial Abdominal pain Active Problems:   COPD (chronic obstructive pulmonary disease)   Atrial fibrillation   Chronic systolic CHF (congestive heart failure)   Pacemaker   Dysphagia   Hepatitis C   Abdominal pain, acute   Assessment: This is a 47 year old, Caucasian female who presents with postprandial abdominal pain with nausea, vomiting, and also has complaints of difficulty swallowing. Differential diagnosis is broad and include mesenteric ischemia, peptic ulcer disease, pancreatitis. Cardiac status appears to be stable despite the mildly abnormal troponin.  Plan: #1 postprandial abdominal pain with nausea, vomiting, and dysphagia: We will proceed with a CT angiogram and pelvis. Consult GI to see her in the morning. Check lipase. Give her PPI. May have to consider repeat endoscopy. However, patient is on warfarin. We'll defer this to gastroenterology.  #2 history of atrial fibrillation with pacemaker, and nonobstructive CAD, and systolic Chronic CHF. All these issues appear to be stable. She does have slightly elevated troponin, however, this seems to be a recurrent issue for her. Her last cardiac catheterization was in 2013. She had similar presentation last month. She was seen by cardiology at that time, and no further workup was recommended. We will  continue to trend the troponin, but I don't anticipate any further cardiac workup at this point. Continue with her cardiac medications. Give her a dose of IV Lasix tonight and continue with oral dosing in the morning. Continue warfarin depending on GI input.  #3 history of hepatitis C: LFTs are normal. Monitor. She apparently is followed by a hepatologist.  #4 history of COPD: Nebulizer treatments. Continue with her inhalers. She is on home oxygen which will be continued.   DVT Prophylaxis: She is on warfarin with therapeutic INR Code Status: Full code Family Communication: Discussed with the patient  Disposition Plan: Observe to telemetry   Further management decisions will depend on results of further testing and patient's response to treatment.  [facility]  [name] Pager [phone]  If 7PM-7AM, please contact night-coverage [email or web address] Password [facility]  01/09/2014, 12:17 AM

## 2014-01-09 NOTE — Progress Notes (Addendum)
[name] PROGRESS NOTE  Kim DavenportSandra F Strawderman ZOX:[id number] DOB: [birth year] DOA: 01/08/2014 PCP: Provider Not In System  Brief narrative: 48 y.o. female with a past medical history of atrial fibrillation on anticoagulation with coumadin, nonobstructive CAD, cardiomyopathy, COPD, multiple recent hospitalization who presented to [facility] ED 01/08/2014 with what seems postprandial pain, nausea and vomiting for past 1 week prior to this admission. No reports of blood in stool or dark stool. No fevers. 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   Assessment/Plan:  Principal Problem:   Post Prandial Abdominal pain - unclear etiology - will follow up on GI recommendations - keep NPO for now - continue PPI therapy  Active Problems:   COPD (chronic obstructive pulmonary disease) - stable at this time - continue spiriva, symbicort and albuterol    Atrial fibrillation - on coumadin - rate controlled with metoprolol    Chronic systolic CHF (congestive heart failure) - has pacemaker - continue metoprolol, lasix, asa and statin therapy - compensated at this time    Depression - continue Seroquel and zoloft  Code Status: full code Family Communication: no family at the bedside  Disposition Plan: home when stable   Manson PasseyEVINE, ALMA, MD  [name] Pager [phone]  If 7PM-7AM, please contact night-coverage [email or web address] Password [email or web address] 01/09/2014, 10:37 AM   LOS: 1 day   Consultants:  Gastroenterology   Procedures:  None   Antibiotics:  None   HPI/Subjective: Says she feels weak this am.  Objective: Filed Vitals:   01/09/14 0339 01/09/14 0613 01/09/14 0652 01/09/14 0927  BP:  104/65  124/80  Pulse:  73  74  Temp:  97.4 F (36.3 C)    TempSrc:  Oral    Resp:  22    Height:      Weight:      SpO2: 98% 97% 98%    No intake or output data in the 24 hours ending 01/09/14  1037  Exam:   General:  Pt is alert, follows commands appropriately, not in acute distress  Cardiovascular: irregular rhythm, rate controlled, S1/S2 appreciated   Respiratory: Clear to auscultation bilaterally but pt is not taking a deep breath as it provokes coughing reflex  Abdomen: Soft, non tender, non distended, bowel sounds present, no guarding  Extremities: No edema, pulses DP and PT palpable bilaterally  Neuro: Grossly nonfocal  Data Reviewed: Basic Metabolic Panel:  Recent Labs Lab 01/08/14 2231 01/09/14 0836  NA 142 141  K 3.7 3.3*  CL 100 95*  CO2 29 31  GLUCOSE 112* 72  BUN 9 8  CREATININE 0.91 0.90  CALCIUM 8.5 8.5   Liver Function Tests:  Recent Labs Lab 01/08/14 2231 01/09/14 0836  AST 35 40*  ALT 26 30  ALKPHOS 60 63  BILITOT 0.5 0.7  PROT 7.3 8.1  ALBUMIN 3.2* 3.7    Recent Labs Lab 01/09/14 0008  LIPASE 53   No results found for this basename: AMMONIA,  in the last 168 hours CBC:  Recent Labs Lab 01/08/14 2231 01/09/14 0836  WBC 7.2 6.1  NEUTROABS 4.3  --   HGB 14.0 14.8  HCT 41.1 44.2  MCV 101.2* 101.1*  PLT 212 191   Cardiac Enzymes:  Recent Labs Lab 01/08/14 2231 01/09/14 0250 01/09/14 0836  TROPONINI 0.32* <0.30 <0.30   BNP: No components found with this basename: POCBNP,  CBG: No results found for this basename: GLUCAP,  in the last 168  hours  No results found for this or any previous visit (from the past 240 hour(s)).   Studies: Dg Chest Portable 1 View 01/08/2014   MPRESSION: No acute finding.  Cardiomegaly and vascular congestion.     Ct Angio Abd/pel W/ And/or W/o 01/09/2014  IMPRESSION: 1. 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 Well-developed collateral flow via the IMA. 2. No acute arterial abnormality such as dissection or mesenteric embolus. 3. Beaded appearance of the SMA which could be from atherosclerosis or fibromuscular dysplasia.     Scheduled Meds: . albuterol  2.5 mg  Nebulization Q6H  . aspirin  81 mg Oral Daily  . atorvastatin  80 mg Oral QHS  . budesonide-formoterol  2 puff Inhalation BID  . carvedilol  6.25 mg Oral BID WC  . furosemide  80 mg Oral BID  . pantoprazole  40 mg Oral Daily  . potassium chloride  40 mEq Oral Daily  . QUEtiapine  50 mg Oral BID  . sertraline  50 mg Oral Daily  . tiotropium  18 mcg Inhalation Daily  . warfarin  4 mg Oral Q24H

## 2014-01-09 NOTE — Progress Notes (Signed)
UR completed 

## 2014-01-09 NOTE — Consult Note (Signed)
Referring Provider: Dr. Rito Ehrlich Primary Gastroenterologist:  Dr. Darrick Penna   Date of Admission: 01/08/14 Date of Consultation: 01/09/14  Reason for Consultation:  Abdominal pain, N/V  HPI:  Kim CAFFEY is a 48 year old female with a history of afib, PE, on chronic Coumadin for anticoagulation. History also includes CAD, COPD, cardiomyopathy, chronic Hep C.. Presented last night with abdominal pain, acute onset last Thursday. Had one small slice of [employer] pizza on Saturday night. Had N/V, looked "black". Vomiting after breakfast yesterday; ate [place] bacon, scrambled eggs. Denies melena. Denies hematochezia.   Foul belching. Pain starts in lower abdomen, radiates upward into chest. Postprandial component. [address], water, worsens pain. Nocturnal reflux. Feels like the bigger she chews, the larger the food gets. Velveeta mac n cheese, worsened heartburn. Vague esophageal dysphagia, has to drink behind it. Feels like a "real hard ball". Has intermittent LUQ cramping. Loose stool, postprandial. Started Thursday. Either loose stool or vomiting after eating. Prescribed Nexium as outpatient. Ran out of her Nexium around December. Using mother's Nexium, took as needed. States no Nexium in past 4-5 days. No NSAIDs. No aspirin powders.   States N/V is most discouraging symptom. Poor historian, talkative and difficult to obtain clear reports from patient when asked direct questions.   Last colonoscopy in [facility]. Several years ago. Last EGD 2010 with Dr. Arlyce Dice. Multiple gastric erosions noted but benign path.   Past Medical History  Diagnosis Date  . Pacemaker     vent paced  . COPD (chronic obstructive pulmonary disease)   . CHF (congestive heart failure)   . Thyroid disease   . Hepatitis     Hep C  . MI (myocardial infarction)   . Pneumonia   . Dysrhythmia     atrial fibrilation  . Cirrhosis   . Shortness of breath   . Diabetes mellitus   . Chest pain   . GERD  (gastroesophageal reflux disease)   . Headache(784.0)   . Anxiety   . Permanent atrial fibrillation   . Chronic pain   . PE (pulmonary embolism)     Past Surgical History  Procedure Laterality Date  . Cholecystectomy    . Pacemaker insertion  02/2012    Biotronik PPM   . Cardiac electrophysiology study and ablation    . Esophagogastroduodenoscopy  Jan 2010    Dr. Arlyce Dice: multiple gastric erosions, benign path    Prior to Admission medications   Medication Sig Start Date End Date Taking? Authorizing Provider  albuterol (PROVENTIL HFA;VENTOLIN HFA) 108 (90 BASE) MCG/ACT inhaler Inhale 2 puffs into the lungs every 6 (six) hours as needed for wheezing or shortness of breath.    Yes Historical Provider, MD  atorvastatin (LIPITOR) 80 MG tablet Take 80 mg by mouth at bedtime.   Yes Historical Provider, MD  budesonide-formoterol (SYMBICORT) 80-4.5 MCG/ACT inhaler Inhale 2 puffs into the lungs 2 (two) times daily.   Yes Historical Provider, MD  carvedilol (COREG) 6.25 MG tablet Take 6.25 mg by mouth 2 (two) times daily with a meal.   Yes Historical Provider, MD  esomeprazole (NEXIUM) 40 MG capsule Take 40 mg by mouth daily before breakfast.   Yes Historical Provider, MD  furosemide (LASIX) 80 MG tablet Take 1 tablet (80 mg total) by mouth 2 (two) times daily. 12/25/13  Yes Erick Blinks, MD  magnesium oxide (MAG-OX) 400 MG tablet Take 400 mg by mouth daily.   Yes Historical Provider, MD  metoprolol (LOPRESSOR) 50 MG tablet Take  25 mg by mouth 3 (three) times daily.   Yes Historical Provider, MD  QUEtiapine (SEROQUEL) 25 MG tablet Take 50 mg by mouth 2 (two) times daily. 12/08/13  Yes Historical Provider, MD  sertraline (ZOLOFT) 50 MG tablet Take 50 mg by mouth daily.   Yes Historical Provider, MD  tiotropium (SPIRIVA) 18 MCG inhalation capsule Place 18 mcg into inhaler and inhale daily.   Yes Historical Provider, MD  traZODone (DESYREL) 100 MG tablet Take 1 tablet (100 mg total) by mouth at  bedtime. 10/11/13  Yes Hazeline Junker, MD  warfarin (COUMADIN) 4 MG tablet Take 4 mg by mouth at bedtime.   Yes Historical Provider, MD    Current Facility-Administered Medications  Medication Dose Route Frequency Provider Last Rate Last Dose  . 0.9 %  sodium chloride infusion  250 mL Intravenous PRN Osvaldo Shipper, MD      . albuterol (PROVENTIL) (2.5 MG/3ML) 0.083% nebulizer solution 2.5 mg  2.5 mg Nebulization Q6H Osvaldo Shipper, MD   2.5 mg at 01/09/14 0651  . albuterol (PROVENTIL) (2.5 MG/3ML) 0.083% nebulizer solution 2.5 mg  2.5 mg Nebulization Q2H PRN Osvaldo Shipper, MD      . aspirin chewable tablet 81 mg  81 mg Oral Daily Osvaldo Shipper, MD      . atorvastatin (LIPITOR) tablet 80 mg  80 mg Oral QHS Osvaldo Shipper, MD      . budesonide-formoterol (SYMBICORT) 80-4.5 MCG/ACT inhaler 2 puff  2 puff Inhalation BID Osvaldo Shipper, MD      . carvedilol (COREG) tablet 6.25 mg  6.25 mg Oral BID WC Osvaldo Shipper, MD   6.25 mg at 01/09/14 0924  . furosemide (LASIX) tablet 80 mg  80 mg Oral BID Osvaldo Shipper, MD   80 mg at 01/09/14 0925  . HYDROmorphone (DILAUDID) injection 0.5 mg  0.5 mg Intravenous Q4H PRN Osvaldo Shipper, MD   0.5 mg at 01/09/14 4540  . hydrOXYzine (ATARAX/VISTARIL) tablet 10 mg  10 mg Oral TID PRN Osvaldo Shipper, MD      . ondansetron (ZOFRAN) tablet 4 mg  4 mg Oral Q6H PRN Osvaldo Shipper, MD       Or  . ondansetron (ZOFRAN) injection 4 mg  4 mg Intravenous Q6H PRN Osvaldo Shipper, MD   4 mg at 01/09/14 0545  . pantoprazole (PROTONIX) EC tablet 40 mg  40 mg Oral Daily Osvaldo Shipper, MD   40 mg at 01/09/14 0925  . potassium chloride SA (K-DUR,KLOR-CON) CR tablet 40 mEq  40 mEq Oral Daily Osvaldo Shipper, MD   40 mEq at 01/09/14 0925  . QUEtiapine (SEROQUEL) tablet 50 mg  50 mg Oral BID Osvaldo Shipper, MD   50 mg at 01/09/14 0925  . sertraline (ZOLOFT) tablet 50 mg  50 mg Oral Daily Osvaldo Shipper, MD      . sodium chloride 0.9 % injection 3 mL  3 mL Intravenous Q12H Osvaldo Shipper, MD      . sodium chloride 0.9 % injection 3 mL  3 mL Intravenous Q12H Osvaldo Shipper, MD   3 mL at 01/09/14 0257  . sodium chloride 0.9 % injection 3 mL  3 mL Intravenous PRN Osvaldo Shipper, MD      . tiotropium [facility]) inhalation capsule 18 mcg  18 mcg Inhalation Daily Osvaldo Shipper, MD      . traZODone (DESYREL) tablet 100 mg  100 mg Oral QHS Osvaldo Shipper, MD        Allergies as of 01/08/2014 - Review Complete  12/23/2013  Allergen Reaction Noted  . Nitroglycerin Other (See Comments) 02/07/2013  . Doxycycline Itching 12/22/2012  . Flexeril [cyclobenzaprine] Other (See Comments) 04/15/2013  . Ibuprofen Other (See Comments) 10/06/2013  . Tramadol Itching 04/15/2013  . Tylenol [acetaminophen] Other (See Comments) 08/23/2013  . Vesicare [solifenacin] Other (See Comments) 05/30/2012  . Amoxil [amoxicillin] Nausea Only 07/15/2013    Family History  Problem Relation Age of Onset  . Coronary artery disease Father [id number]  . Coronary artery disease Mother [id number]  . Coronary artery disease Brother   . Colon cancer Neg Hx     History   Social History  . Marital Status: Single    Spouse Name: N/A    Number of Children: N/A  . Years of Education: N/A   Occupational History  . Not on file.   Social History Main Topics  . Smoking status: Current Every Day Smoker -- 0.50 packs/day for 30 years    Types: Cigarettes  . Smokeless tobacco: Current User    Types: Snuff  . Alcohol Use: No  . Drug Use: No  . Sexual Activity: No   Other Topics Concern  . Not on file   Social History Narrative   Contact information: [phone] (cell), [phone] (landline)    Review of Systems: As mentioned in HPI.   Physical Exam: Vital signs in last 24 hours: Temp:  [97.3 F (36.3 C)-97.8 F (36.6 C)] 97.4 F (36.3 C) (02/03 [id number]) Pulse Rate:  [73-88] 74 (02/03 0927) Resp:  [21-24] 22 (02/03 0613) BP: (104-124)/(37-84) 124/80 mmHg (02/03 0927) SpO2:  [95 %-98 %] 98 % (02/03  0652) FiO2 (%):  [28 %] 28 % (02/03 0222) Weight:  [239 lb 1.6 oz (108.455 kg)-240 lb (108.863 kg)] 239 lb 1.6 oz (108.455 kg) (02/03 0249) Last BM Date: 01/08/14 General:   Alert,  No distress, appears older than stated age.  Head:  Normocephalic and atraumatic. Eyes:  Sclera clear, no icterus.   Conjunctiva pink. Ears:  Normal auditory acuity. Nose:  No deformity, discharge,  or lesions. Mouth:  Poor dentition Lungs:  Expiratory wheeze bilaterally Heart:  S1 S2 present, irregularly irregular Abdomen:  Reports of discomfort out of proportion to palpation. Winces with only slight touch but abdomen soft. TTP LUQ, epigastric. Normal BS.  Rectal:  Deferred  Extremities:  Without clubbing or edema. Neurologic:  Alert and  oriented x4;  grossly normal neurologically. Skin:  Intact without significant lesions or rashes. Cervical Nodes:  No significant cervical adenopathy. Psych:  Alert and cooperative. Normal mood and affect.  Intake/Output from previous day:   Intake/Output this shift:    Lab Results:  Recent Labs  01/08/14 2231 01/09/14 0836  WBC 7.2 6.1  HGB 14.0 14.8  HCT 41.1 44.2  PLT 212 191   BMET  Recent Labs  01/08/14 2231 01/09/14 0836  NA 142 141  K 3.7 3.3*  CL 100 95*  CO2 29 31  GLUCOSE 112* 72  BUN 9 8  CREATININE 0.91 0.90  CALCIUM 8.5 8.5   LFT  Recent Labs  01/08/14 2231 01/09/14 0836  PROT 7.3 8.1  ALBUMIN 3.2* 3.7  AST 35 40*  ALT 26 30  ALKPHOS 60 63  BILITOT 0.5 0.7   PT/INR  Recent Labs  01/08/14 2330 01/09/14 0836  LABPROT 23.7* 25.1*  INR 2.20* 2.37*    Studies/Results: Dg Chest Portable 1 View  01/08/2014   CLINICAL DATA:  Shortness of breath.  EXAM: PORTABLE CHEST - 1 VIEW  COMPARISON:  Single view of the chest 12/22/2013.  FINDINGS: Cardiomegaly and mild vascular congestion appear unchanged. No consolidative process, pneumothorax or effusion. Pacing device is in place.  IMPRESSION: No acute finding.  Cardiomegaly and  vascular congestion.   Electronically Signed   By: Drusilla Kanner M.D.   On: 01/08/2014 22:39   Ct Angio Abd/pel W/ And/or W/o  01/09/2014   CLINICAL DATA:  Pain after eating.  Possible mesenteric ischemia.  EXAM: CT ANGIOGRAPHY ABDOMEN AND PELVIS WITH CONTRAST AND WITHOUT CONTRAST  TECHNIQUE: Multidetector CT imaging of the abdomen and pelvis was performed using the standard protocol during bolus administration of intravenous contrast. Multiplanar reconstructed images including MIPs were obtained and reviewed to evaluate the vascular anatomy.  CONTRAST:  OMNIPAQUE IOHEXOL 350 MG/ML SOLN  COMPARISON:  04/20/2013  FINDINGS: BODY WALL: Unremarkable.  LOWER CHEST: Cardiomegaly.  There is a dual-chamber pacer.  ABDOMEN/PELVIS:  Liver: The left lobe is mildly atrophic, with lobulated contours.  Biliary: Cholecystectomy, which likely accounts for the chronic biliary enlargement.  Pancreas: Unremarkable.  Spleen: Unremarkable.  Adrenals: Unremarkable.  Kidneys and ureters: Lobulated kidneys which could represent fetal lobulation, although there is also likely superimposed scarring. Presumed cyst in the interpolar right kidney, 12 mm in diameter. No hydronephrosis or nephrolithiasis.  Bladder: Unremarkable.  Reproductive: Unremarkable.  Bowel: No obstruction. No evidence of bowel wall thickening or pneumatosis to suggest acute ischemic enteritis or colitis. Normal appendix.  Retroperitoneum: Stable appearance of mildly enlarged deep liver drainage nodes, with the portacaval node the largest at 31 x 13 mm.  Peritoneum: No free fluid or gas.  Vascular: There is high-grade narrowing of the celiac axis origin which is chronic. There is no delayed flow distal to the stenosis. There is a notable noncalcified circumferential atherosclerotic plaque involving the proximal SMA, causing high-grade stenosis. The downstream vessels promptly opacify. The mid SMA has a undulating contour which is chronic. No evidence of acute  dissection or embolism. There is atherosclerosis at the origin of the IMA, which is hypertrophied in the setting of collateral flow to the SMA. The abdominal aorta is non aneurysmal, although diffusely atherosclerotic. Single bilateral renal arteries with atherosclerosis but no hemodynamically significant stenosis.  OSSEOUS: No acute abnormalities.  Review of the MIP images confirms the above findings.  IMPRESSION: 1. 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 Well-developed collateral flow via the IMA. 2. No acute arterial abnormality such as dissection or mesenteric embolus. 3. Beaded appearance of the SMA which could be from atherosclerosis or fibromuscular dysplasia.   Electronically Signed   By: Tiburcio Pea M.D.   On: 01/09/2014 02:53    Impression: 48 year old female with postprandial abdominal pain, N/V, with CTA revealing 2 vessel disease. Discussed with Dr. Tyron Russell. Although there is a well-developed collateral vessel, the possibility remains of chronic mesenteric ischemia. No evidence of acute ischemic injury currently. Would benefit from discussion with vascular radiology for further input regarding intervention. Also unable to rule out occult gastritis, PUD; however, some of her symptoms may be secondary to sporadic PPI therapy and poor dietary choices. Vague esophageal dysphagia noted, non-urgent. Will discuss with Dr. Darrick Penna best course of management for patient; EGD on hold until further discussion regarding possibility of chronic mesenteric ischemia. Presence of anticoagulation could present some challenges.   Postprandial loose stools also noted, which seems to be improving. Although patient complains of significant pain, she also notes that her symptoms of nausea and vomiting are most bothersome to her.   Plan: Zofran  for nausea with meals Clear liquids  Discuss with Dr. Darrick Penna further work-up for possible mesenteric ischemia PPI Obtain stool studies if further  diarrhea.   Nira Retort, ANP-BC [facility]     LOS: 1 day    01/09/2014, 10:16 AM

## 2014-01-09 NOTE — Progress Notes (Signed)
Nutrition Brief Note  Patient identified on the Malnutrition Screening Tool (MST) Report  Wt Readings from Last 15 Encounters:  01/09/14 239 lb 1.6 oz (108.455 kg)  12/23/13 240 lb 9.6 oz (109.135 kg)  12/17/13 232 lb 5.8 oz (105.4 kg)  10/11/13 224 lb 6.9 oz (101.8 kg)  08/23/13 210 lb (95.255 kg)  07/16/13 241 lb 12.8 oz (109.68 kg)  07/08/13 230 lb (104.327 kg)  04/15/13 223 lb (101.152 kg)  03/28/13 235 lb (106.595 kg)  03/05/13 230 lb (104.327 kg)  02/20/13 235 lb 10.8 oz (106.9 kg)  02/11/13 219 lb 1.6 oz (99.383 kg)  12/22/12 224 lb (101.606 kg)  08/29/12 243 lb (110.224 kg)  07/21/12 243 lb 9.7 oz (110.5 kg)    Body mass index is 35.29 kg/(m^2). Patient meets criteria for obesity, class II based on current BMI.   Current diet order is NPO, patient is consuming approximately n/a% of meals at this time. Labs and medications reviewed.   No nutrition interventions warranted at this time. If nutrition issues arise, please consult RD.   Shaka Zech A. Mayford Knife, RD, LDN Pager: [phone]

## 2014-01-09 NOTE — Progress Notes (Signed)
ANTICOAGULATION CONSULT NOTE - Initial Consult  Pharmacy Consult for Coumadin Indication: atrial fibrillation  Allergies  Allergen Reactions  . Nitroglycerin Other (See Comments)    CAUSED NUMBNESS ALL OVER  . Doxycycline Itching  . Flexeril [Cyclobenzaprine] Other (See Comments)    Sweating, Lightheaded   . Ibuprofen Other (See Comments)    Increase BP, dizziness, lightheaded  . Tramadol Itching  . Tylenol [Acetaminophen] Other (See Comments)    Pt has Hep C  . Vesicare [Solifenacin] Other (See Comments)    Makes my sweat turn yellow  . Amoxil [Amoxicillin] Nausea Only    Patient Measurements: Height: 5\' 9"  (175.3 cm) Weight: 239 lb 1.6 oz (108.455 kg) IBW/kg (Calculated) : 66.2  Vital Signs: Temp: 97.4 F (36.3 C) (02/03 0613) Temp src: Oral (02/03 0613) BP: 104/65 mmHg (02/03 0613) Pulse Rate: 73 (02/03 0613)  Labs:  Recent Labs  01/08/14 2231 01/08/14 2330 01/09/14 0250  HGB 14.0  --   --   HCT 41.1  --   --   PLT 212  --   --   LABPROT  --  23.7*  --   INR  --  2.20*  --   CREATININE 0.91  --   --   TROPONINI 0.32*  --  <0.30    Estimated Creatinine Clearance: 98.1 ml/min (by C-G formula based on Cr of 0.91).   Medical History: Past Medical History  Diagnosis Date  . Pacemaker     vent paced  . COPD (chronic obstructive pulmonary disease)   . CHF (congestive heart failure)   . Thyroid disease   . Hepatitis     Hep C  . MI (myocardial infarction)   . Pneumonia   . Dysrhythmia     atrial fibrilation  . Cirrhosis   . Shortness of breath   . Diabetes mellitus   . Chest pain   . GERD (gastroesophageal reflux disease)   . Headache(784.0)   . Anxiety   . Permanent atrial fibrillation   . Chronic pain   . PE (pulmonary embolism)   . Hepatitis C     Medications:  Scheduled:  . albuterol  2.5 mg Nebulization Q6H  . aspirin  81 mg Oral Daily  . atorvastatin  80 mg Oral QHS  . budesonide-formoterol  2 puff Inhalation BID  . carvedilol   6.25 mg Oral BID WC  . furosemide  80 mg Oral BID  . pantoprazole  40 mg Oral Daily  . potassium chloride  40 mEq Oral Daily  . QUEtiapine  50 mg Oral BID  . sertraline  50 mg Oral Daily  . sodium chloride  3 mL Intravenous Q12H  . sodium chloride  3 mL Intravenous Q12H  . tiotropium  18 mcg Inhalation Daily  . traZODone  100 mg Oral QHS    Assessment: 48 yo F on chronic warfarin 4mg  daily for Afib.  INR therapeutic on admission.  No bleeding noted.  She was admitted with abdominal pain & GI consult/ recommendations pending.    Goal of Therapy:  INR 2-3   Plan:  Coumadin 4mg  po daily Daily INR  Kim [name], [facility] 01/09/2014,7:53 AM

## 2014-01-10 ENCOUNTER — Encounter (HOSPITAL_COMMUNITY)
Admission: EM | Disposition: A | Discharge status: Home / Self Care | Payer: Self-pay | Source: Home / Self Care | Attending: Emergency Medicine

## 2014-01-10 ENCOUNTER — Encounter (HOSPITAL_COMMUNITY): Payer: Self-pay | Admitting: *Deleted

## 2014-01-10 DIAGNOSIS — R112 Nausea with vomiting, unspecified: Secondary | ICD-10-CM

## 2014-01-10 HISTORY — PX: ESOPHAGOGASTRODUODENOSCOPY: SHX5428

## 2014-01-10 LAB — BASIC METABOLIC PANEL
BUN: 10 mg/dL (ref 6–23)
CHLORIDE: 94 meq/L — AB (ref 96–112)
CO2: 33 meq/L — AB (ref 19–32)
Calcium: 7.9 mg/dL — ABNORMAL LOW (ref 8.4–10.5)
Creatinine, Ser: 1.01 mg/dL (ref 0.50–1.10)
GFR calc non Af Amer: 64 mL/min — ABNORMAL LOW (ref >90)
GFR, EST AFRICAN AMERICAN: 74 mL/min — AB (ref >90)
GLUCOSE: 118 mg/dL — AB (ref 70–99)
Potassium: 3.4 mEq/L — ABNORMAL LOW (ref 3.7–5.3)
Sodium: 139 mEq/L (ref 137–147)

## 2014-01-10 LAB — CBC
HEMATOCRIT: 41.4 % (ref 36.0–46.0)
HEMOGLOBIN: 14.2 g/dL (ref 12.0–15.0)
MCH: 34.1 pg — ABNORMAL HIGH (ref 26.0–34.0)
MCHC: 34.3 g/dL (ref 30.0–36.0)
MCV: 99.3 fL (ref 78.0–100.0)
Platelets: 223 10*3/uL (ref 150–400)
RBC: 4.17 MIL/uL (ref 3.87–5.11)
RDW: 14.3 % (ref 11.5–15.5)
WBC: 6.1 10*3/uL (ref 4.0–10.5)

## 2014-01-10 LAB — PROTIME-INR
INR: 2.34 — AB (ref 0.00–1.49)
Prothrombin Time: 24.9 seconds — ABNORMAL HIGH (ref 11.6–15.2)

## 2014-01-10 LAB — GLUCOSE, CAPILLARY: Glucose-Capillary: 115 mg/dL — ABNORMAL HIGH (ref 70–99)

## 2014-01-10 LAB — MAGNESIUM: Magnesium: 1.2 mg/dL — ABNORMAL LOW (ref 1.5–2.5)

## 2014-01-10 SURGERY — EGD (ESOPHAGOGASTRODUODENOSCOPY)
Anesthesia: Moderate Sedation

## 2014-01-10 MED ORDER — ONDANSETRON HCL 4 MG/2ML IJ SOLN
INTRAMUSCULAR | Status: DC | PRN
  Administered 2014-01-10: 4 mg via INTRAVENOUS

## 2014-01-10 MED ORDER — SODIUM CHLORIDE 0.9 % IV SOLN
INTRAVENOUS | Status: DC
  Administered 2014-01-10: 14:00:00 via INTRAVENOUS

## 2014-01-10 MED ORDER — SUCRALFATE 1 GM/10ML PO SUSP
1.0000 g | Freq: Three times a day (TID) | ORAL | Status: DC
  Administered 2014-01-10 – 2014-01-11 (×4): 1 g via ORAL
  Filled 2014-01-10 (×4): qty 10

## 2014-01-10 MED ORDER — MIDAZOLAM HCL 5 MG/5ML IJ SOLN
INTRAMUSCULAR | Status: AC
  Filled 2014-01-10: qty 10

## 2014-01-10 MED ORDER — WARFARIN SODIUM 2 MG PO TABS
4.0000 mg | ORAL_TABLET | Freq: Once | ORAL | Status: AC
  Administered 2014-01-10: 4 mg via ORAL
  Filled 2014-01-10: qty 2

## 2014-01-10 MED ORDER — ONDANSETRON HCL 4 MG/2ML IJ SOLN
INTRAMUSCULAR | Status: AC
  Filled 2014-01-10: qty 2

## 2014-01-10 MED ORDER — LIDOCAINE VISCOUS 2 % MT SOLN
OROMUCOSAL | Status: DC | PRN
  Administered 2014-01-10: 3 mL via OROMUCOSAL

## 2014-01-10 MED ORDER — MIDAZOLAM HCL 5 MG/5ML IJ SOLN
INTRAMUSCULAR | Status: DC | PRN
  Administered 2014-01-10: 2 mg via INTRAVENOUS

## 2014-01-10 MED ORDER — SIMETHICONE 40 MG/0.6ML PO SUSP
ORAL | Status: DC | PRN
  Administered 2014-01-10: 15:00:00

## 2014-01-10 MED ORDER — MEPERIDINE HCL 100 MG/ML IJ SOLN
INTRAMUSCULAR | Status: DC | PRN
Start: 2014-01-10 — End: 2014-01-10
  Administered 2014-01-10: 50 mg via INTRAVENOUS

## 2014-01-10 MED ORDER — MEPERIDINE HCL 100 MG/ML IJ SOLN
INTRAMUSCULAR | Status: AC
  Filled 2014-01-10: qty 2

## 2014-01-10 MED ORDER — MAGNESIUM SULFATE 40 MG/ML IJ SOLN
2.0000 g | Freq: Once | INTRAMUSCULAR | Status: AC
  Administered 2014-01-10: 2 g via INTRAVENOUS
  Filled 2014-01-10: qty 50

## 2014-01-10 MED ORDER — LIDOCAINE VISCOUS 2 % MT SOLN
OROMUCOSAL | Status: AC
  Filled 2014-01-10: qty 15

## 2014-01-10 NOTE — Progress Notes (Signed)
Subjective: Heartburn with eating but no vomiting. Continues to report nausea, no hematemesis. Persistent abdominal pain. NPO since MN for possible EGD today; diagnostic only as patient has been on Coumadin.   Objective: Vital signs in last 24 hours: Temp:  [97.1 F (36.2 C)-97.9 F (36.6 C)] 97.1 F (36.2 C) (02/04 [id number]) Pulse Rate:  [74-77] 75 (02/04 0638) Resp:  [14-20] 14 (02/04 0638) BP: (109-124)/(75-80) 115/75 mmHg (02/04 0638) SpO2:  [93 %-100 %] 97 % (02/04 [id number]) Last BM Date: 01/08/14 General:   Drowsy, awakens easily to verbal stimuli.  Head:  Normocephalic and atraumatic. Heart:  S1, S2 present, no murmurs noted.  Abdomen:  Bowel sounds present, soft, non-tender, non-distended. No HSM or hernias noted. No rebound or guarding.  Msk:  Symmetrical without gross deformities. Normal posture. Extremities:  Without clubbing or edema. Neurologic:  Alert and  oriented x4;  grossly normal neurologically.  Intake/Output from previous day: 02/03 0701 - 02/04 0700 In: 0  Out: 750 [Urine:750] Intake/Output this shift:    Lab Results:  Recent Labs  01/08/14 2231 01/09/14 0836  WBC 7.2 6.1  HGB 14.0 14.8  HCT 41.1 44.2  PLT 212 191   BMET  Recent Labs  01/08/14 2231 01/09/14 0836  NA 142 141  K 3.7 3.3*  CL 100 95*  CO2 29 31  GLUCOSE 112* 72  BUN 9 8  CREATININE 0.91 0.90  CALCIUM 8.5 8.5   LFT  Recent Labs  01/08/14 2231 01/09/14 0836  PROT 7.3 8.1  ALBUMIN 3.2* 3.7  AST 35 40*  ALT 26 30  ALKPHOS 60 63  BILITOT 0.5 0.7   PT/INR  Recent Labs  01/09/14 0836 01/10/14 0545  LABPROT 25.1* 24.9*  INR 2.37* 2.34*     Studies/Results: Dg Chest Portable 1 View  01/08/2014   CLINICAL DATA:  Shortness of breath.  EXAM: PORTABLE CHEST - 1 VIEW  COMPARISON:  Single view of the chest 12/22/2013.  FINDINGS: Cardiomegaly and mild vascular congestion appear unchanged. No consolidative process, pneumothorax or effusion. Pacing device is in place.   IMPRESSION: No acute finding.  Cardiomegaly and vascular congestion.   Electronically Signed   By: Drusilla Kannerhomas  Dalessio M.D.   On: 01/08/2014 22:39   Ct Angio Abd/pel W/ And/or W/o  01/09/2014   CLINICAL DATA:  Pain after eating.  Possible mesenteric ischemia.  EXAM: CT ANGIOGRAPHY ABDOMEN AND PELVIS WITH CONTRAST AND WITHOUT CONTRAST  TECHNIQUE: Multidetector CT imaging of the abdomen and pelvis was performed using the standard protocol during bolus administration of intravenous contrast. Multiplanar reconstructed images including MIPs were obtained and reviewed to evaluate the vascular anatomy.  CONTRAST:  100mL OMNIPAQUE IOHEXOL 350 MG/ML SOLN  COMPARISON:  04/20/2013  FINDINGS: BODY WALL: Unremarkable.  LOWER CHEST: Cardiomegaly.  There is a dual-chamber pacer.  ABDOMEN/PELVIS:  Liver: The left lobe is mildly atrophic, with lobulated contours.  Biliary: Cholecystectomy, which likely accounts for the chronic biliary enlargement.  Pancreas: Unremarkable.  Spleen: Unremarkable.  Adrenals: Unremarkable.  Kidneys and ureters: Lobulated kidneys which could represent fetal lobulation, although there is also likely superimposed scarring. Presumed cyst in the interpolar right kidney, 12 mm in diameter. No hydronephrosis or nephrolithiasis.  Bladder: Unremarkable.  Reproductive: Unremarkable.  Bowel: No obstruction. No evidence of bowel wall thickening or pneumatosis to suggest acute ischemic enteritis or colitis. Normal appendix.  Retroperitoneum: Stable appearance of mildly enlarged deep liver drainage nodes, with the portacaval node the largest at 31 x 13 mm.  Peritoneum: No free fluid  or gas.  Vascular: There is high-grade narrowing of the celiac axis origin which is chronic. There is no delayed flow distal to the stenosis. There is a notable noncalcified circumferential atherosclerotic plaque involving the proximal SMA, causing high-grade stenosis. The downstream vessels promptly opacify. The mid SMA has a undulating  contour which is chronic. No evidence of acute dissection or embolism. There is atherosclerosis at the origin of the IMA, which is hypertrophied in the setting of collateral flow to the SMA. The abdominal aorta is non aneurysmal, although diffusely atherosclerotic. Single bilateral renal arteries with atherosclerosis but no hemodynamically significant stenosis.  OSSEOUS: No acute abnormalities.  Review of the MIP images confirms the above findings.  IMPRESSION: 1. 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 Well-developed collateral flow via the IMA. 2. No acute arterial abnormality such as dissection or mesenteric embolus. 3. Beaded appearance of the SMA which could be from atherosclerosis or fibromuscular dysplasia.   Electronically Signed   By: Tiburcio Pea M.D.   On: 01/09/2014 02:53    Assessment: 48 year old female with postprandial abdominal pain, N/V, with CTA revealing 2 vessel disease. Coffee-ground emesis prior to admission noted. Epigastric pain, N/V could also be stemming from gastritis, occult PUD. Hematemesis likely secondary to esophagitis. With sporadic PPI use at home and poor dietary choices, uncontrolled GERD likely contributing to symptoms. No NSAIDs. Aspirin has been discontinued.   Patient is on Coumadin due to history of afib, PE. Although INR is above 2, will proceed with DIAGNOSTIC only EGD. NO dilations. She will need outpatient evaluation with interventional radiology for possibility of chronic mesenteric ischemia.   Outpatient follow-up of Hep C as well.    Plan: Remain NPO BID PPI Diagnostic EGD with Dr. Jena Gauss today. PATIENT REMAINS ON COUMADIN.  If no contraindication, patient will need to resume Coumadin after EGD.  Risks and benefits discussed with patient, who states understanding. Outpatient management of Hep C Outpatient referral to Interventional radiology  Nira Retort, ANP-BC [facility]    LOS: 2 days     01/10/2014, 9:01 AM

## 2014-01-10 NOTE — Progress Notes (Signed)
ANTICOAGULATION CONSULT NOTE - follow up  Pharmacy Consult for Coumadin Indication: atrial fibrillation  Allergies  Allergen Reactions  . Nitroglycerin Other (See Comments)    CAUSED NUMBNESS ALL OVER  . Doxycycline Itching  . Flexeril [Cyclobenzaprine] Other (See Comments)    Sweating, Lightheaded   . Ibuprofen Other (See Comments)    Increase BP, dizziness, lightheaded  . Tramadol Itching  . Tylenol [Acetaminophen] Other (See Comments)    Pt has Hep C  . Vesicare [Solifenacin] Other (See Comments)    Makes my sweat turn yellow  . Amoxil [Amoxicillin] Nausea Only   Patient Measurements: Height: 5\' 9"  (175.3 cm) Weight: 239 lb 1.6 oz (108.455 kg) IBW/kg (Calculated) : 66.2  Vital Signs: Temp: 97 F (36.1 C) (02/04 1342) Temp src: Oral (02/04 1342) BP: 116/87 mmHg (02/04 1500) Pulse Rate: 82 (02/04 1500)  Labs:  Recent Labs  01/08/14 2231 01/08/14 2330 01/09/14 0250 01/09/14 0836 01/09/14 1409 01/10/14 0545 01/10/14 0932  HGB 14.0  --   --  14.8  --   --  14.2  HCT 41.1  --   --  44.2  --   --  41.4  PLT 212  --   --  191  --   --  223  LABPROT  --  23.7*  --  25.1*  --  24.9*  --   INR  --  2.20*  --  2.37*  --  2.34*  --   CREATININE 0.91  --   --  0.90  --   --  1.01  TROPONINI 0.32*  --  <0.30 <0.30 <0.30  --   --    Estimated Creatinine Clearance: 88.4 ml/min (by C-G formula based on Cr of 1.01).  Medical History: Past Medical History  Diagnosis Date  . Pacemaker     vent paced  . COPD (chronic obstructive pulmonary disease)   . CHF (congestive heart failure)   . Thyroid disease   . Hepatitis     Hep C  . MI (myocardial infarction)   . Pneumonia   . Dysrhythmia     atrial fibrilation  . Cirrhosis   . Shortness of breath   . Diabetes mellitus   . Chest pain   . GERD (gastroesophageal reflux disease)   . Headache(784.0)   . Anxiety   . Permanent atrial fibrillation   . Chronic pain   . PE (pulmonary embolism)    Medications:   Scheduled:  . albuterol  2.5 mg Nebulization Q6H  . atorvastatin  80 mg Oral QHS  . budesonide-formoterol  2 puff Inhalation BID  . furosemide  80 mg Oral BID  . metoprolol  25 mg Oral TID  . pantoprazole  40 mg Oral BID AC  . potassium chloride  40 mEq Oral Daily  . QUEtiapine  50 mg Oral BID  . sertraline  50 mg Oral Daily  . sodium chloride  3 mL Intravenous Q12H  . sodium chloride  3 mL Intravenous Q12H  . sucralfate  1 g Oral TID WC & HS  . tiotropium  18 mcg Inhalation Daily  . traZODone  100 mg Oral QHS  . warfarin  4 mg Oral Once  . Warfarin - Pharmacist Dosing Inpatient   Does not apply Q24H   Assessment: 48 yo F on chronic warfarin 4mg  daily for Afib.  INR therapeutic on admission.  No bleeding noted.  She was admitted with abdominal pain & GI consult ongoing.  Discussed with Dr Jena Gauss >>  continue Coumadin per GI.  Goal of Therapy:  INR 2-3   Plan:  Coumadin 4mg  po today x 1 Daily INR  Margo AyeHall, Gaurav Baldree A 01/10/2014,3:38 PM

## 2014-01-10 NOTE — Progress Notes (Signed)
[name] PROGRESS NOTE  Kim DavenportSandra F [name] MWN:[id number]:[id number] DOB: 1966/04/25 DOA: 01/08/2014 PCP: Provider Not In System  Brief narrative: 48 y.o. female with a past medical history of atrial fibrillation on anticoagulation with coumadin, nonobstructive CAD, cardiomyopathy, COPD, multiple recent hospitalization who presented to [facility] 01/08/2014 with what seems postprandial pain, nausea and vomiting for past 1 week prior to this admission. No reports of blood in stool or dark stool. No fevers. 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   Assessment/Plan:  Principal Problem:   Post Prandial Abdominal pain/? UGIB - unclear etiology - continue PPI therapy  - GI consultation and followup appreciated. DD for postprandial abdominal pain, nausea, vomiting, CTA revealing two-vessel disease, coffee-ground emesis prior to admission,? Melena while inpatient: Gastritis, occult PUD, esophagitis versus other etiology. - As per GI, diagnostic EGD on 2/4 (INR therapeutic). Outpatient evaluation with interventional radiology for possibility of chronic eccentric ischemia. Discussed with Ms. Kim HallsAnna [name], ANP-BC - Hemoglobin stable despite reported melena  Active Problems:   COPD (chronic obstructive pulmonary disease) - stable at this time - continue spiriva, symbicort and albuterol     Atrial fibrillation - on coumadin - rate controlled/V. paced with metoprolol     Chronic systolic CHF (congestive heart failure) - has pacemaker - continue metoprolol, lasix, asa and statin therapy - compensated at this time     Depression - continue Seroquel and zoloft  Hypokalemia/hypomagnesemia - Replace intravenously and follow labs in the morning  Code Status: full code Family Communication: no family at the bedside  Disposition Plan: home when stable   [facility] LLCNGALGI,Kim Schauer, MD  [name] Pager [phone]  If 7PM-7AM,  please contact night-coverage [email or web address] Password [email or web address] 01/10/2014, 10:56 AM  Time spent: 30 minutes  LOS: 2 days   Consultants:  Gastroenterology   Procedures:  None   Antibiotics:  None   HPI/Subjective: Continues to complain of diffuse abdominal pain, worse on eating. Had some nausea but denies vomiting since 01/08/14. Claims to have had 2 black tarry stools yesterday and one this a.m.  Objective: Filed Vitals:   01/09/14 1907 01/09/14 2219 01/10/14 0211 01/10/14 0638  BP:  119/79  115/75  Pulse:  76  75  Temp:  97.5 F (36.4 C)  97.1 F (36.2 C)  TempSrc:  Oral  Oral  Resp:  20  14  Height:      Weight:      SpO2: 100% 97% 93% 97%    Intake/Output Summary (Last 24 hours) at 01/10/14 1056 Last data filed at 01/10/14 0935  Gross per 24 hour  Intake      0 ml  Output   1100 ml  Net  -1100 ml    Exam:   General:  Pt is alert, follows commands appropriately, not in acute distress  Cardiovascular: S1 and S2 heard, RRR. No JVD, murmurs or pedal edema. Telemetry shows a paced rhythm.  Respiratory: Clear to auscultation bilaterally. No increased work of breathing.  Abdomen: Not distended. Diffuse mild tenderness but without acute peritoneal signs. Normal bowel sounds heard.  Extremities: No edema, pulses DP and PT palpable bilaterally  Neuro: Grossly nonfocal  Data Reviewed: Basic Metabolic Panel:  Recent Labs Lab 01/08/14 2231 01/09/14 0836 01/10/14 0932  NA 142 141 139  K 3.7 3.3* 3.4*  CL 100 95* 94*  CO2 29 31 33*  GLUCOSE 112* 72 118*  BUN 9 8 10   CREATININE 0.91 0.90 1.01  CALCIUM 8.5 8.5 7.9*  MG  --   --  1.2*   Liver Function Tests:  Recent Labs Lab 01/08/14 2231 01/09/14 0836  AST 35 40*  ALT 26 30  ALKPHOS 60 63  BILITOT 0.5 0.7  PROT 7.3 8.1  ALBUMIN 3.2* 3.7    Recent Labs Lab 01/09/14 0008  LIPASE 53   No results found for this basename: AMMONIA,  in the last 168 hours CBC:  Recent Labs Lab 01/08/14 2231  01/09/14 0836 01/10/14 0932  WBC 7.2 6.1 6.1  NEUTROABS 4.3  --   --   HGB 14.0 14.8 14.2  HCT 41.1 44.2 41.4  MCV 101.2* 101.1* 99.3  PLT 212 191 223   Cardiac Enzymes:  Recent Labs Lab 01/08/14 2231 01/09/14 0250 01/09/14 0836 01/09/14 1409  TROPONINI 0.32* <0.30 <0.30 <0.30   BNP: No components found with this basename: POCBNP,  CBG: No results found for this basename: GLUCAP,  in the last 168 hours  No results found for this or any previous visit (from the past 240 hour(s)).   Studies: Dg Chest Portable 1 View 01/08/2014   MPRESSION: No acute finding.  Cardiomegaly and vascular congestion.     Ct Angio Abd/pel W/ And/or W/o 01/09/2014  IMPRESSION: 1. 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 Well-developed collateral flow via the IMA. 2. No acute arterial abnormality such as dissection or mesenteric embolus. 3. Beaded appearance of the SMA which could be from atherosclerosis or fibromuscular dysplasia.     Scheduled Meds: . albuterol  2.5 mg Nebulization Q6H  . aspirin  81 mg Oral Daily  . atorvastatin  80 mg Oral QHS  . budesonide-formoterol  2 puff Inhalation BID  . carvedilol  6.25 mg Oral BID WC  . furosemide  80 mg Oral BID  . pantoprazole  40 mg Oral Daily  . potassium chloride  40 mEq Oral Daily  . QUEtiapine  50 mg Oral BID  . sertraline  50 mg Oral Daily  . tiotropium  18 mcg Inhalation Daily  . warfarin  4 mg Oral Q24H

## 2014-01-10 NOTE — Op Note (Signed)
[facility] [address] [place], [address]   ENDOSCOPY PROCEDURE REPORT  PATIENT: Kim, [name]  MR#: [id number] BIRTHDATE: 03-29-1966 , 47  yrs. old GENDER: Female ENDOSCOPIST: R.  Roetta Sessions, MD FACP FACG REFERRED BY:  Jonette Eva, M.D. PROCEDURE DATE:  01/10/2014 PROCEDURE:     EGD with esophageal biopsy  INDICATIONS:    Nausea and vomiting hematemesis; hemoglobin remains in the normal range. Patient anticoagulated on Coumadin.  INFORMED CONSENT:   The risks, benefits, limitations, alternatives and imponderables have been discussed.  The potential for biopsy, esophogeal dilation, etc. have also been reviewed.  Questions have been answered.  All parties agreeable.  Please see the history and physical in the medical record for more information.  MEDICATIONS:     Versed 2 mg IV and Demerol 50 mg IV. Zofran 4 mg IV. Xylocaine gel 2% orally  DESCRIPTION OF PROCEDURE:   The UK-3838F ([id number])  endoscope was introduced through the mouth and advanced to the second portion of the duodenum without difficulty or limitations.  The mucosal surfaces were surveyed very carefully during advancement of the scope and upon withdrawal.  Retroflexion view of the proximal stomach and esophagogastric junction was performed.      FINDINGS: Single "tongues" of salmon-colored epithelium coming up into the distal esophagus 1-2 cm. No esophagitis. Somewhat patulous EG junction.  No esophageal varices. Stomach empty. 2 cm hiatal hernia. Gastric mucosa appeared normal. No evidence of portal gastropathy. Pylorus patent. Normal first and second portion of the duodenum  THERAPEUTIC / DIAGNOSTIC MANEUVERS PERFORMED:  2 small pinch biopsies of the GE junction taken for histologic study. This was done with minimal bleeding.   COMPLICATIONS:  None  IMPRESSION:  Patulous EG junction. Query short segment Barrett's esophagus-status post pinch biopsy as described above.  Hiatal hernia; otherwise negative EGD.  I doubt recent significant upper GI bleed.  RECOMMENDATIONS:  Continue Protonix 40 mg twice a day. Carafate 1 g 4 times daily x5 day. Advance diet.  Elective interventional radiology consultation for consideration of mesenteric angiography with therapeutic intervention as appropriate. Followup on pathology.  Resume Coumadin-I have discussed with the pharmacist.    _______________________________ R. Roetta Sessions, MD FACP [facility] eSigned:  R. Roetta Sessions, MD FACP [facility] 01/10/2014 3:05 PM     CC:  PATIENT NAME:  Kim, [name] MR#: [id number]

## 2014-01-11 ENCOUNTER — Encounter: Payer: Self-pay | Admitting: Obstetrics & Gynecology

## 2014-01-11 ENCOUNTER — Other Ambulatory Visit: Payer: Self-pay | Admitting: Gastroenterology

## 2014-01-11 ENCOUNTER — Telehealth: Payer: Self-pay | Admitting: Gastroenterology

## 2014-01-11 DIAGNOSIS — R9389 Abnormal findings on diagnostic imaging of other specified body structures: Secondary | ICD-10-CM

## 2014-01-11 DIAGNOSIS — K920 Hematemesis: Secondary | ICD-10-CM

## 2014-01-11 DIAGNOSIS — E876 Hypokalemia: Secondary | ICD-10-CM

## 2014-01-11 DIAGNOSIS — R109 Unspecified abdominal pain: Secondary | ICD-10-CM

## 2014-01-11 LAB — BASIC METABOLIC PANEL
BUN: 13 mg/dL (ref 6–23)
CALCIUM: 7.7 mg/dL — AB (ref 8.4–10.5)
CO2: 31 mEq/L (ref 19–32)
Chloride: 91 mEq/L — ABNORMAL LOW (ref 96–112)
Creatinine, Ser: 0.94 mg/dL (ref 0.50–1.10)
GFR calc Af Amer: 81 mL/min — ABNORMAL LOW (ref >90)
GFR, EST NON AFRICAN AMERICAN: 70 mL/min — AB (ref >90)
Glucose, Bld: 176 mg/dL — ABNORMAL HIGH (ref 70–99)
POTASSIUM: 3.1 meq/L — AB (ref 3.7–5.3)
Sodium: 137 mEq/L (ref 137–147)

## 2014-01-11 LAB — CBC
HCT: 41.8 % (ref 36.0–46.0)
Hemoglobin: 14.1 g/dL (ref 12.0–15.0)
MCH: 33.9 pg (ref 26.0–34.0)
MCHC: 33.7 g/dL (ref 30.0–36.0)
MCV: 100.5 fL — ABNORMAL HIGH (ref 78.0–100.0)
PLATELETS: 219 10*3/uL (ref 150–400)
RBC: 4.16 MIL/uL (ref 3.87–5.11)
RDW: 14.2 % (ref 11.5–15.5)
WBC: 7.2 10*3/uL (ref 4.0–10.5)

## 2014-01-11 LAB — MAGNESIUM: Magnesium: 1.5 mg/dL (ref 1.5–2.5)

## 2014-01-11 LAB — PROTIME-INR
INR: 2.08 — AB (ref 0.00–1.49)
PROTHROMBIN TIME: 22.7 s — AB (ref 11.6–15.2)

## 2014-01-11 MED ORDER — PANTOPRAZOLE SODIUM 40 MG PO TBEC: 40.0000 mg | DELAYED_RELEASE_TABLET | Freq: Two times a day (BID) | ORAL | Status: DC

## 2014-01-11 MED ORDER — MAGNESIUM OXIDE 400 MG PO TABS: 400.0000 mg | ORAL_TABLET | Freq: Every day | ORAL | Status: DC

## 2014-01-11 MED ORDER — WARFARIN SODIUM 2 MG PO TABS: 4.0000 mg | ORAL_TABLET | ORAL | Status: DC

## 2014-01-11 MED ORDER — POTASSIUM CHLORIDE CRYS ER 20 MEQ PO TBCR
20.0000 meq | EXTENDED_RELEASE_TABLET | Freq: Once | ORAL | Status: AC
  Administered 2014-01-11: 20 meq via ORAL
  Filled 2014-01-11: qty 1

## 2014-01-11 MED ORDER — ATORVASTATIN CALCIUM 80 MG PO TABS
80.0000 mg | ORAL_TABLET | Freq: Every day | ORAL | Status: DC
Start: 2014-01-11 — End: 2014-05-16

## 2014-01-11 MED ORDER — SUCRALFATE 1 GM/10ML PO SUSP: 1.0000 g | Freq: Three times a day (TID) | ORAL | Status: DC

## 2014-01-11 MED ORDER — FUROSEMIDE 80 MG PO TABS
80.0000 mg | ORAL_TABLET | Freq: Two times a day (BID) | ORAL | Status: DC
Start: 2014-01-11 — End: 2014-02-03

## 2014-01-11 MED ORDER — SERTRALINE HCL 50 MG PO TABS: 50.0000 mg | ORAL_TABLET | Freq: Every day | ORAL | Status: DC

## 2014-01-11 MED ORDER — POTASSIUM CHLORIDE CRYS ER 10 MEQ PO TBCR: 40.0000 meq | EXTENDED_RELEASE_TABLET | Freq: Every day | ORAL | Status: DC

## 2014-01-11 NOTE — Progress Notes (Addendum)
Patient discharged home via [place] transportation set up per SW.  IV removed - WNL.  Educated on HF management and daily weights/low sodium diet. Weight chart given.  Patient has HH following already - instructed patient to ask for their assistance if needing help managing weight.  Educated also given on [employer] and instructed on new medications and how to take them.  Patient has no questions at this time.  Verbalizes understanding of DC instructions.  Stable to DC at this time.  Left floor via WC with NT.

## 2014-01-11 NOTE — Progress Notes (Signed)
Subjective:  Patient complains of vaginal spotting this morning. No BM today. Denies rectal bleeding. States she had black tarry stool yesterday. None recorded by nursing staff. Tolerating diet. Ask to stay one more night in hospital for vaginal spotting because her son is low on gas money.  Objective: Vital signs in last 24 hours: Temp:  [97 F (36.1 C)-97.6 F (36.4 C)] 97.6 F (36.4 C) (02/05 0624) Pulse Rate:  [73-88] 73 (02/05 0624) Resp:  [16-25] 16 (02/05 0624) BP: (116-149)/(78-95) 149/94 mmHg (02/05 0624) SpO2:  [93 %-100 %] 96 % (02/05 0624) Last BM Date: 01/10/14 General:   Alert,  Well-developed, well-nourished, pleasant and cooperative in NAD Head:  Normocephalic and atraumatic. Eyes:  Sclera clear, no icterus.   Abdomen:  Soft, nontender and nondistended.   Normal bowel sounds, without guarding, and without rebound.   Extremities:  Without clubbing, deformity or edema. Neurologic:  Alert and  oriented x4;  grossly normal neurologically. Skin:  Intact without significant lesions or rashes. Psych:  Alert and cooperative. Normal mood and affect.  Intake/Output from previous day: 02/04 0701 - 02/05 0700 In: 740 [P.O.:240; I.V.:500] Out: 500 [Urine:500] Intake/Output this shift:    Lab Results: CBC  Recent Labs  01/09/14 0836 01/10/14 0932 01/11/14 0604  WBC 6.1 6.1 7.2  HGB 14.8 14.2 14.1  HCT 44.2 41.4 41.8  MCV 101.1* 99.3 100.5*  PLT 191 223 219   BMET  Recent Labs  01/09/14 0836 01/10/14 0932 01/11/14 0604  NA 141 139 137  K 3.3* 3.4* 3.1*  CL 95* 94* 91*  CO2 31 33* 31  GLUCOSE 72 118* 176*  BUN 8 10 13   CREATININE 0.90 1.01 0.94  CALCIUM 8.5 7.9* 7.7*   LFTs  Recent Labs  01/08/14 2231 01/09/14 0836  BILITOT 0.5 0.7  ALKPHOS 60 63  AST 35 40*  ALT 26 30  PROT 7.3 8.1  ALBUMIN 3.2* 3.7    Recent Labs  01/09/14 0008  LIPASE 53   PT/INR  Recent Labs  01/09/14 0836 01/10/14 0545 01/11/14 0604  LABPROT 25.1* 24.9* 22.7*   INR 2.37* 2.34* 2.08*      Imaging Studies:     Ct Angio Abd/pel W/ And/or W/o  01/09/2014   CLINICAL DATA:  Pain after eating.  Possible mesenteric ischemia.  EXAM: CT ANGIOGRAPHY ABDOMEN AND PELVIS WITH CONTRAST AND WITHOUT CONTRAST  TECHNIQUE: Multidetector CT imaging of the abdomen and pelvis was performed using the standard protocol during bolus administration of intravenous contrast. Multiplanar reconstructed images including MIPs were obtained and reviewed to evaluate the vascular anatomy.  CONTRAST:  OMNIPAQUE IOHEXOL 350 MG/ML SOLN  COMPARISON:  04/20/2013  FINDINGS: BODY WALL: Unremarkable.  LOWER CHEST: Cardiomegaly.  There is a dual-chamber pacer.  ABDOMEN/PELVIS:  Liver: The left lobe is mildly atrophic, with lobulated contours.  Biliary: Cholecystectomy, which likely accounts for the chronic biliary enlargement.  Pancreas: Unremarkable.  Spleen: Unremarkable.  Adrenals: Unremarkable.  Kidneys and ureters: Lobulated kidneys which could represent fetal lobulation, although there is also likely superimposed scarring. Presumed cyst in the interpolar right kidney, 12 mm in diameter. No hydronephrosis or nephrolithiasis.  Bladder: Unremarkable.  Reproductive: Unremarkable.  Bowel: No obstruction. No evidence of bowel wall thickening or pneumatosis to suggest acute ischemic enteritis or colitis. Normal appendix.  Retroperitoneum: Stable appearance of mildly enlarged deep liver drainage nodes, with the portacaval node the largest at 31 x 13 mm.  Peritoneum: No free fluid or gas.  Vascular: There is high-grade narrowing  of the celiac axis origin which is chronic. There is no delayed flow distal to the stenosis. There is a notable noncalcified circumferential atherosclerotic plaque involving the proximal SMA, causing high-grade stenosis. The downstream vessels promptly opacify. The mid SMA has a undulating contour which is chronic. No evidence of acute dissection or embolism. There is  atherosclerosis at the origin of the IMA, which is hypertrophied in the setting of collateral flow to the SMA. The abdominal aorta is non aneurysmal, although diffusely atherosclerotic. Single bilateral renal arteries with atherosclerosis but no hemodynamically significant stenosis.  OSSEOUS: No acute abnormalities.  Review of the MIP images confirms the above findings.  IMPRESSION: 1. 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 Well-developed collateral flow via the IMA. 2. No acute arterial abnormality such as dissection or mesenteric embolus. 3. Beaded appearance of the SMA which could be from atherosclerosis or fibromuscular dysplasia.   Electronically Signed   By: Tiburcio PeaJonathan  Watts M.D.   On: 01/09/2014 02:53  [2 weeks]   Assessment: 48 year old female with postprandial abdominal pain, N/V, with coffee-ground emesis prior to admission. EGD showed patulous EG junction. ?short segment Barrett's esophagus, bx pending. Doubt significant UGI bleed despite ongoing complaints of black stool. Her Hgb has been normal without significant change.    Patient is on Coumadin due to history of afib, PE.   She will need outpatient evaluation with interventional radiology for possibility of chronic mesenteric ischemia.   She now has new complaint of vaginal spotting. Last menstrual cycle six years ago. Recommend outpatient gyn evaluation.   Plan: 1. F/U path. 2. Outpatient interventional radiology consultation.  3. PPI BID. 4. Carafate QID for five days. 5. Outpatient f/u for chronic HCV/cirrhosis.  6. Stable for D/C from GI standpoint.  Discussed case with Dr. Waymon AmatoHongalgi.    LOS: 3 days   Tana CoastLeslie Avon Molock  01/11/2014, 7:52 AM

## 2014-01-11 NOTE — Clinical Social Work Note (Signed)
CSW referred for transportation. CSW called [facility] and they have arranged Medicaid transport this afternoon. RN aware and will take pt to ED entrance when notified by driver.  Derenda Fennel, [place] [phone]

## 2014-01-11 NOTE — Discharge Summary (Signed)
Physician Discharge Summary  Kim [name] YBW:[id number] DOB: 1966-07-08 DOA: 01/08/2014  PCP: Dorian Heckle, MD  Admit date: 01/08/2014 Discharge date: 01/11/2014  Time spent: Less than 30 minutes  Recommendations for Outpatient Follow-up:  1. Dr. Loyal Gambler, PCP in 5 days with repeat labs (CBC, Mg, CMP, PT & INR) 2. Dr. Duane Lope, Gyn 3. IR Radiology Eval & Mx: 01/23/2014 at 11 AM. 4. Dr. Jonette Eva, GI in 4 weeks 5. Elevated TSH: Consider starting thyroid supplements and repeating TSH versus repeating TSH in 4-6 weeks.  Discharge Diagnoses:  Principal Problem:   Post Prandial Abdominal pain Active Problems:   COPD (chronic obstructive pulmonary disease)   Atrial fibrillation   Chronic systolic CHF (congestive heart failure)   Pacemaker   Dysphagia   Hepatitis C   Abdominal pain, acute   Discharge Condition: Improved & Stable  Diet recommendation: Heart Healthy diet.  Filed Weights   01/08/14 2201 01/09/14 0249  Weight: 108.863 kg (240 lb) 108.455 kg (239 lb 1.6 oz)    History of present illness:  48 y.o. female with a past medical history of atrial fibrillation on anticoagulation with coumadin, nonobstructive CAD, cardiomyopathy, COPD, multiple recent hospitalizations who presented to [facility] ED 01/08/2014 with what seems postprandial pain, nausea and vomiting for past 1 week prior to this admission. No reports of blood in stool or dark stool. No fevers. 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  Hospital Course:   Postprandial abdominal pain, nausea, vomiting with minimal UGIB - GI was consulted. She underwent EGD which showed patulous EG junction,? Short segment Barrett's esophagus, biopsy pending. As per GI, doubt significant upper GI bleed despite ongoing complaints of black stools. Her hemoglobin has been normal and stable without significant change. GI recommends following pathology  results, PPI twice a day and Carafate 4 times a day x5 days. GI will arrange outpatient followup as well as interventional radiology consultation for evaluation of chronic mesenteric ischemia.  - Improved. 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 Well-developed collateral flow via the IMA. GI consulted and recommend outpatient evaluation by interventional radiology and have arranged for appointment.  Vaginal spotting - Last menstrual period 6 years ago. Patient had minimal vaginal spotting without significant bleeding. Recommend outpatient GYN consultation for further evaluation.  History of chronic HCV/cirrhosis - Outpatient followup.   COPD - Stable. Continue home medications  Atrial fibrillation/pacemaker/nonobstructive CAD - Controlled ventricular rate and anticoagulated. Followup with PCP few days with repeat labs. Continue metoprolol. - Minimally elevated troponin x1-apparently a recurrent issue. Last cardiac catheterization was in 2013. She apparently had similar presentation last month at which time she was seen by cardiology who did not recommend any further workup.  Chronic systolic CHF - Compensated. Continue home regimen.  Depression - Stable without suicidal or homicidal ideations.  Hypokalemia and hypomagnesemia - Replaced IV day prior to admission. Given additional oral potassium prior to discharge. Continue oral potassium and magnesium supplements at discharge and followup with PCP in a few days with repeat labs.  Medication noncompliance - Patient seems to run out of multiple medications PTA. Consult regarding compliance with medications. New prescriptions were provided.  Elevated TSH/possible hypothyroidism - Patient has had periodically elevated TSH since 2010 but does not seem to be an any thyroid supplements. Defer  to  PCP regarding starting her on thyroid supplements and following TSH. Clinically appears euthyroid.    Consultations:  Gastroenterology   Procedures:  EGD 01/10/14:  IMPRESSION: Patulous EG junction. Query short segment Barrett's  esophagus-status post pinch biopsy as described above. Hiatal  hernia; otherwise negative EGD.  I doubt recent significant upper GI bleed.  RECOMMENDATIONS: Continue Protonix 40 mg twice a day. Carafate 1 g  4 times daily x5 day. Advance diet.    Discharge Exam:  Complaints:  Chronic intermittent abdominal pain. Denies nausea or vomiting. Tolerated regular consistency diet. No BM since 01/10/14. Vaginal spotting on tissue wipe.   Filed Vitals:   01/10/14 2003 01/11/14 0258 01/11/14 0624 01/11/14 0759  BP: 140/87  149/94   Pulse: 79  73   Temp: 97.4 F (36.3 C)  97.6 F (36.4 C)   TempSrc: Oral  Oral   Resp: 16  16   Height:      Weight:      SpO2: 100% 98% 96% 93%    General exam: Comfortable young female seen sitting up in eating breakfast in the morning. Respiratory system: Clear. No increased work of breathing. Cardiovascular system: S1 & S2 heard, RRR. No JVD, murmurs, gallops, clicks or pedal edema. Telemetry: Paced rhythm. Gastrointestinal system: Abdomen is nondistended, soft and nontender. Normal bowel sounds heard. Central nervous system: Alert and oriented. No focal neurological deficits. Extremities: Symmetric 5 x 5 power.  Discharge Instructions      Discharge Orders   Future Orders Complete By Expires   (HEART FAILURE PATIENTS) Call MD:  Anytime you have any of the following symptoms: 1) 3 pound weight gain in 24 hours or 5 pounds in 1 week 2) shortness of breath, with or without a dry hacking cough 3) swelling in the hands, feet or stomach 4) if you have to sleep on extra pillows at night in order to breathe.  As directed    Call MD for:  extreme fatigue  As directed    Call MD for:  persistant dizziness or light-headedness  As  directed    Call MD for:  persistant nausea and vomiting  As directed    Call MD for:  severe uncontrolled pain  As directed    Call MD for:  temperature >100.4  As directed    Diet - low sodium heart healthy  As directed    Discharge instructions  As directed    Comments:     Continue home oxygen at prior setting of 2 L per minute via nasal cannula, continuously.   Increase activity slowly  As directed        Medication List    STOP taking these medications       esomeprazole 40 MG capsule  Commonly known as:  NEXIUM  Replaced by:  pantoprazole 40 MG tablet      TAKE these medications       albuterol 108 (90 BASE) MCG/ACT inhaler  Commonly known as:  PROVENTIL HFA;VENTOLIN HFA  Inhale 2 puffs into the lungs every 6 (six) hours as needed for wheezing or shortness of breath.     atorvastatin 80 MG tablet  Commonly known as:  LIPITOR  Take 1 tablet (80 mg total) by mouth at bedtime.     budesonide-formoterol 80-4.5 MCG/ACT inhaler  Commonly known as:  SYMBICORT  Inhale 2 puffs into the lungs 2 (two) times daily.     furosemide 80 MG tablet  Commonly known as:  LASIX  Take  1 tablet (80 mg total) by mouth 2 (two) times daily.     magnesium oxide 400 MG tablet  Commonly known as:  MAG-OX  Take 1 tablet (400 mg total) by mouth daily.     metoprolol 50 MG tablet  Commonly known as:  LOPRESSOR  Take 25 mg by mouth 3 (three) times daily.     pantoprazole 40 MG tablet  Commonly known as:  PROTONIX  Take 1 tablet (40 mg total) by mouth 2 (two) times daily before a meal.     potassium chloride 10 MEQ tablet  Commonly known as:  K-DUR,KLOR-CON  Take 4 tablets (40 mEq total) by mouth daily.     QUEtiapine 25 MG tablet  Commonly known as:  SEROQUEL  Take 50 mg by mouth 2 (two) times daily.     sertraline 50 MG tablet  Commonly known as:  ZOLOFT  Take 1 tablet (50 mg total) by mouth daily.     sucralfate 1 GM/10ML suspension  Commonly known as:  CARAFATE  Take 10 mLs  (1 g total) by mouth 4 (four) times daily -  with meals and at bedtime.     tiotropium 18 MCG inhalation capsule  Commonly known as:  SPIRIVA  Place 18 mcg into inhaler and inhale daily.     traZODone 100 MG tablet  Commonly known as:  DESYREL  Take 1 tablet (100 mg total) by mouth at bedtime.     warfarin 4 MG tablet  Commonly known as:  COUMADIN  Take 4 mg by mouth at bedtime.       Follow-up Information   Follow up with Dorian Heckle, MD. Schedule an appointment as soon as possible for a visit in 5 days. (To be seen with repeat labs (CMP, Mg, CBC, PT & INR))    Specialty:  Family Medicine   Contact information:   [address] [place] [address]        The results of significant diagnostics from this hospitalization (including imaging, microbiology, ancillary and laboratory) are listed below for reference.    Significant Diagnostic Studies:  Dg Chest Portable 1 View  01/08/2014   CLINICAL DATA:  Shortness of breath.  EXAM: PORTABLE CHEST - 1 VIEW  COMPARISON:  Single view of the chest 12/22/2013.  FINDINGS: Cardiomegaly and mild vascular congestion appear unchanged. No consolidative process, pneumothorax or effusion. Pacing device is in place.  IMPRESSION: No acute finding.  Cardiomegaly and vascular congestion.   Electronically Signed   By: Drusilla Kanner M.D.   On: 01/08/2014 22:39   Ct Angio Abd/pel W/ And/or W/o  01/09/2014   CLINICAL DATA:  Pain after eating.  Possible mesenteric ischemia.  EXAM: CT ANGIOGRAPHY ABDOMEN AND PELVIS WITH CONTRAST AND WITHOUT CONTRAST  TECHNIQUE: Multidetector CT imaging of the abdomen and pelvis was performed using the standard protocol during bolus administration of intravenous contrast. Multiplanar reconstructed images including MIPs were obtained and reviewed to evaluate the vascular anatomy.  CONTRAST:  OMNIPAQUE IOHEXOL 350 MG/ML SOLN  COMPARISON:  04/20/2013  FINDINGS: BODY WALL: Unremarkable.  LOWER CHEST: Cardiomegaly.  There  is a dual-chamber pacer.  ABDOMEN/PELVIS:  Liver: The left lobe is mildly atrophic, with lobulated contours.  Biliary: Cholecystectomy, which likely accounts for the chronic biliary enlargement.  Pancreas: Unremarkable.  Spleen: Unremarkable.  Adrenals: Unremarkable.  Kidneys and ureters: Lobulated kidneys which could represent fetal lobulation, although there is also likely superimposed scarring. Presumed cyst in the interpolar right kidney, 12 mm in  diameter. No hydronephrosis or nephrolithiasis.  Bladder: Unremarkable.  Reproductive: Unremarkable.  Bowel: No obstruction. No evidence of bowel wall thickening or pneumatosis to suggest acute ischemic enteritis or colitis. Normal appendix.  Retroperitoneum: Stable appearance of mildly enlarged deep liver drainage nodes, with the portacaval node the largest at 31 x 13 mm.  Peritoneum: No free fluid or gas.  Vascular: There is high-grade narrowing of the celiac axis origin which is chronic. There is no delayed flow distal to the stenosis. There is a notable noncalcified circumferential atherosclerotic plaque involving the proximal SMA, causing high-grade stenosis. The downstream vessels promptly opacify. The mid SMA has a undulating contour which is chronic. No evidence of acute dissection or embolism. There is atherosclerosis at the origin of the IMA, which is hypertrophied in the setting of collateral flow to the SMA. The abdominal aorta is non aneurysmal, although diffusely atherosclerotic. Single bilateral renal arteries with atherosclerosis but no hemodynamically significant stenosis.  OSSEOUS: No acute abnormalities.  Review of the MIP images confirms the above findings.  IMPRESSION: 1. 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 Well-developed collateral flow via the IMA. 2. No acute arterial abnormality such as dissection or mesenteric embolus. 3. Beaded appearance of the SMA which could be from atherosclerosis or fibromuscular dysplasia.    Electronically Signed   By: Tiburcio PeaJonathan  Watts M.D.   On: 01/09/2014 02:53    Microbiology: No results found for this or any previous visit (from the past 240 hour(s)).   Labs: Basic Metabolic Panel:  Recent Labs Lab 01/08/14 2231 01/09/14 0836 01/10/14 0932 01/11/14 0604  NA 142 141 139 137  K 3.7 3.3* 3.4* 3.1*  CL 100 95* 94* 91*  CO2 29 31 33* 31  GLUCOSE 112* 72 118* 176*  BUN 9 8 10 13   CREATININE 0.91 0.90 1.01 0.94  CALCIUM 8.5 8.5 7.9* 7.7*  MG  --   --  1.2* 1.5   Liver Function Tests:  Recent Labs Lab 01/08/14 2231 01/09/14 0836  AST 35 40*  ALT 26 30  ALKPHOS 60 63  BILITOT 0.5 0.7  PROT 7.3 8.1  ALBUMIN 3.2* 3.7    Recent Labs Lab 01/09/14 0008  LIPASE 53   No results found for this basename: AMMONIA,  in the last 168 hours CBC:  Recent Labs Lab 01/08/14 2231 01/09/14 0836 01/10/14 0932 01/11/14 0604  WBC 7.2 6.1 6.1 7.2  NEUTROABS 4.3  --   --   --   HGB 14.0 14.8 14.2 14.1  HCT 41.1 44.2 41.4 41.8  MCV 101.2* 101.1* 99.3 100.5*  PLT 212 191 223 219   Cardiac Enzymes:  Recent Labs Lab 01/08/14 2231 01/09/14 0250 01/09/14 0836 01/09/14 1409  TROPONINI 0.32* <0.30 <0.30 <0.30   BNP: BNP (last 3 results)  Recent Labs  12/14/13 2055 12/22/13 2320 01/08/14 2231  PROBNP 1144.0* 1579.0* 1022.0*   CBG:  Recent Labs Lab 01/10/14 1341  GLUCAP 115*    Additional labs: 1. INR on 01/11/14:2.08 2. TSH: 10.78 3. EGD biopsy pathology: Diagnosis Esophagus, biopsy - SQUAMOCOLUMNAR MUCOSA WITH ACUTE INFLAMMATION. - NEGATIVE FOR HELICOBACTER PYLORI. - NO INTESTINAL METAPLASIA, DYSPLASIA OR MALIGNANCY IDENTIFIED. Microscopic Comment A Warthin-Starry stain is performed to determine the possibility of the presence of Helicobacter pylori. The Warthin-Starry stain is negative for organisms of Helicobacter pylori.       Signed:  Marcellus ScottHONGALGI,Monzerrat Wellen, MD, FACP, FHM. [name] Pager [phone]  If 7PM-7AM, please contact  night-coverage [email or web address] Password [email or web address] 01/11/2014, 12:12 PM

## 2014-01-11 NOTE — Telephone Encounter (Signed)
Please arrange for interventional radiology consultation regarding abnormal CTA with stenosis of celiac axis and proximal SMA.  Please arrange for E30 OV in 4 weeks for hospital follow up of chronic HCV, abd pain, hematemesis.

## 2014-01-11 NOTE — Progress Notes (Signed)
ANTICOAGULATION CONSULT NOTE - follow up  Pharmacy Consult for Coumadin Indication: atrial fibrillation  Allergies  Allergen Reactions  . Nitroglycerin Other (See Comments)    CAUSED NUMBNESS ALL OVER  . Doxycycline Itching  . Flexeril [Cyclobenzaprine] Other (See Comments)    Sweating, Lightheaded   . Ibuprofen Other (See Comments)    Increase BP, dizziness, lightheaded  . Tramadol Itching  . Tylenol [Acetaminophen] Other (See Comments)    Pt has Hep C  . Vesicare [Solifenacin] Other (See Comments)    Makes my sweat turn yellow  . Amoxil [Amoxicillin] Nausea Only   Patient Measurements: Height: 5\' 9"  (175.3 cm) Weight: 239 lb 1.6 oz (108.455 kg) IBW/kg (Calculated) : 66.2  Vital Signs: Temp: 97.6 F (36.4 C) (02/05 0624) Temp src: Oral (02/05 0624) BP: 149/94 mmHg (02/05 0624) Pulse Rate: 73 (02/05 0624)  Labs:  Recent Labs  01/09/14 0250 01/09/14 0836 01/09/14 1409 01/10/14 0545 01/10/14 0932 01/11/14 0604  HGB  --  14.8  --   --  14.2 14.1  HCT  --  44.2  --   --  41.4 41.8  PLT  --  191  --   --  223 219  LABPROT  --  25.1*  --  24.9*  --  22.7*  INR  --  2.37*  --  2.34*  --  2.08*  CREATININE  --  0.90  --   --  1.01 0.94  TROPONINI <0.30 <0.30 <0.30  --   --   --    Estimated Creatinine Clearance: 95 ml/min (by C-G formula based on Cr of 0.94).  Medical History: Past Medical History  Diagnosis Date  . Pacemaker     vent paced  . COPD (chronic obstructive pulmonary disease)   . CHF (congestive heart failure)   . Thyroid disease   . Hepatitis     Hep C  . MI (myocardial infarction)   . Pneumonia   . Dysrhythmia     atrial fibrilation  . Cirrhosis   . Shortness of breath   . Diabetes mellitus   . Chest pain   . GERD (gastroesophageal reflux disease)   . Headache(784.0)   . Anxiety   . Permanent atrial fibrillation   . Chronic pain   . PE (pulmonary embolism)    Medications:  Scheduled:  . albuterol  2.5 mg Nebulization Q6H  .  atorvastatin  80 mg Oral QHS  . budesonide-formoterol  2 puff Inhalation BID  . furosemide  80 mg Oral BID  . metoprolol  25 mg Oral TID  . pantoprazole  40 mg Oral BID AC  . potassium chloride  20 mEq Oral Once  . potassium chloride  40 mEq Oral Daily  . QUEtiapine  50 mg Oral BID  . sertraline  50 mg Oral Daily  . sodium chloride  3 mL Intravenous Q12H  . sodium chloride  3 mL Intravenous Q12H  . sucralfate  1 g Oral TID WC & HS  . tiotropium  18 mcg Inhalation Daily  . traZODone  100 mg Oral QHS  . warfarin  4 mg Oral Q24H  . Warfarin - Pharmacist Dosing Inpatient   Does not apply Q24H   Assessment: 48 yo F on chronic warfarin 4mg  daily for Afib.  INR remains therapeutic.  No bleeding noted.  She was admitted with abdominal pain & GI consult done.  Discussed with Dr Jena Gaussourk >> GI w/u done, continue Coumadin.  Goal of Therapy:  INR 2-3  Plan:  Coumadin 4mg  po daily at 4pm (home dose) Daily INR  Margo Aye, Avamae Dehaan A 01/11/2014,8:11 AM

## 2014-01-11 NOTE — Care Management Note (Signed)
    Page 1 of 1   01/11/2014     12:16:24 PM   CARE MANAGEMENT NOTE 01/11/2014  Patient:  Kim [name], Kim [name]   Account Number:  [id number]  Date Initiated:  01/11/2014  Documentation initiated by:  Rosemary Holms  Subjective/Objective Assessment:   [place] from [place]. Pt is being DC'd but states she does not have a ride or an O2 canister. She has left a message for her D' in law but has not heard from her.     Action/Plan:   CSW alerted to possible transportation needs.   Anticipated DC Date:  01/11/2014   Anticipated DC Plan:  HOME/SELF CARE  In-house referral  Clinical Social Worker      DC Planning Services  CM consult      Choice offered to / List presented to:             Status of service:  Completed, signed off Medicare Important Message given?   (If response is "NO", the following Medicare IM given date fields will be blank) Date Medicare IM given:   Date Additional Medicare IM given:    Discharge Disposition:  HOME/SELF CARE  Per UR Regulation:    If discussed at Long Length of Stay Meetings, dates discussed:    Comments:  01/11/14 Rosemary Holms RN BSN CM

## 2014-01-11 NOTE — Telephone Encounter (Signed)
Referral has been sent to IR

## 2014-01-12 ENCOUNTER — Encounter: Payer: Self-pay | Admitting: Obstetrics & Gynecology

## 2014-01-12 ENCOUNTER — Encounter: Payer: Self-pay | Admitting: *Deleted

## 2014-01-14 ENCOUNTER — Encounter: Payer: Self-pay | Admitting: Internal Medicine

## 2014-01-15 ENCOUNTER — Encounter: Payer: Self-pay | Admitting: Gastroenterology

## 2014-01-15 ENCOUNTER — Encounter (HOSPITAL_COMMUNITY): Payer: Self-pay | Admitting: Internal Medicine

## 2014-01-15 NOTE — Telephone Encounter (Signed)
Pt is aware of OV on 3/9 at 8 with [facility] and appt card was mailed

## 2014-01-18 ENCOUNTER — Encounter (HOSPITAL_COMMUNITY): Payer: Self-pay | Admitting: Emergency Medicine

## 2014-01-18 ENCOUNTER — Emergency Department (HOSPITAL_COMMUNITY)
Admission: EM | Admit: 2014-01-18 | Discharge: 2014-01-18 | Disposition: A | Discharge status: Home / Self Care | Payer: Medicaid Other | Attending: Emergency Medicine | Admitting: Emergency Medicine

## 2014-01-18 DIAGNOSIS — Z86711 Personal history of pulmonary embolism: Secondary | ICD-10-CM | POA: Insufficient documentation

## 2014-01-18 DIAGNOSIS — F411 Generalized anxiety disorder: Secondary | ICD-10-CM | POA: Insufficient documentation

## 2014-01-18 DIAGNOSIS — I509 Heart failure, unspecified: Secondary | ICD-10-CM | POA: Insufficient documentation

## 2014-01-18 DIAGNOSIS — Z8701 Personal history of pneumonia (recurrent): Secondary | ICD-10-CM | POA: Insufficient documentation

## 2014-01-18 DIAGNOSIS — Z91199 Patient's noncompliance with other medical treatment and regimen due to unspecified reason: Secondary | ICD-10-CM | POA: Insufficient documentation

## 2014-01-18 DIAGNOSIS — I252 Old myocardial infarction: Secondary | ICD-10-CM | POA: Insufficient documentation

## 2014-01-18 DIAGNOSIS — Z7901 Long term (current) use of anticoagulants: Secondary | ICD-10-CM | POA: Insufficient documentation

## 2014-01-18 DIAGNOSIS — Z950 Presence of cardiac pacemaker: Secondary | ICD-10-CM | POA: Insufficient documentation

## 2014-01-18 DIAGNOSIS — J449 Chronic obstructive pulmonary disease, unspecified: Secondary | ICD-10-CM | POA: Insufficient documentation

## 2014-01-18 DIAGNOSIS — R1031 Right lower quadrant pain: Secondary | ICD-10-CM | POA: Insufficient documentation

## 2014-01-18 DIAGNOSIS — R197 Diarrhea, unspecified: Secondary | ICD-10-CM | POA: Insufficient documentation

## 2014-01-18 DIAGNOSIS — G8929 Other chronic pain: Secondary | ICD-10-CM | POA: Insufficient documentation

## 2014-01-18 DIAGNOSIS — Z79899 Other long term (current) drug therapy: Secondary | ICD-10-CM | POA: Insufficient documentation

## 2014-01-18 DIAGNOSIS — K219 Gastro-esophageal reflux disease without esophagitis: Secondary | ICD-10-CM | POA: Insufficient documentation

## 2014-01-18 DIAGNOSIS — F17200 Nicotine dependence, unspecified, uncomplicated: Secondary | ICD-10-CM | POA: Insufficient documentation

## 2014-01-18 DIAGNOSIS — J4489 Other specified chronic obstructive pulmonary disease: Secondary | ICD-10-CM | POA: Insufficient documentation

## 2014-01-18 DIAGNOSIS — I4891 Unspecified atrial fibrillation: Secondary | ICD-10-CM | POA: Insufficient documentation

## 2014-01-18 DIAGNOSIS — R109 Unspecified abdominal pain: Secondary | ICD-10-CM

## 2014-01-18 DIAGNOSIS — Z9119 Patient's noncompliance with other medical treatment and regimen: Secondary | ICD-10-CM | POA: Insufficient documentation

## 2014-01-18 DIAGNOSIS — M7989 Other specified soft tissue disorders: Secondary | ICD-10-CM | POA: Insufficient documentation

## 2014-01-18 DIAGNOSIS — R339 Retention of urine, unspecified: Secondary | ICD-10-CM | POA: Insufficient documentation

## 2014-01-18 DIAGNOSIS — E119 Type 2 diabetes mellitus without complications: Secondary | ICD-10-CM | POA: Insufficient documentation

## 2014-01-18 DIAGNOSIS — Z8619 Personal history of other infectious and parasitic diseases: Secondary | ICD-10-CM | POA: Insufficient documentation

## 2014-01-18 DIAGNOSIS — Z9089 Acquired absence of other organs: Secondary | ICD-10-CM | POA: Insufficient documentation

## 2014-01-18 LAB — BASIC METABOLIC PANEL
BUN: 7 mg/dL (ref 6–23)
CALCIUM: 8.7 mg/dL (ref 8.4–10.5)
CO2: 30 mEq/L (ref 19–32)
Chloride: 102 mEq/L (ref 96–112)
Creatinine, Ser: 0.74 mg/dL (ref 0.50–1.10)
GFR calc Af Amer: >90 mL/min (ref >90)
GFR calc non Af Amer: >90 mL/min (ref >90)
GLUCOSE: 94 mg/dL (ref 70–99)
Potassium: 4.2 mEq/L (ref 3.7–5.3)
SODIUM: 143 meq/L (ref 137–147)

## 2014-01-18 LAB — CBC WITH DIFFERENTIAL/PLATELET
Basophils Absolute: 0 10*3/uL (ref 0.0–0.1)
Basophils Relative: 0 % (ref 0–1)
EOS ABS: 0.1 10*3/uL (ref 0.0–0.7)
EOS PCT: 2 % (ref 0–5)
HEMATOCRIT: 41.5 % (ref 36.0–46.0)
Hemoglobin: 14 g/dL (ref 12.0–15.0)
LYMPHS ABS: 2.2 10*3/uL (ref 0.7–4.0)
Lymphocytes Relative: 31 % (ref 12–46)
MCH: 34.1 pg — AB (ref 26.0–34.0)
MCHC: 33.7 g/dL (ref 30.0–36.0)
MCV: 101 fL — ABNORMAL HIGH (ref 78.0–100.0)
MONO ABS: 0.7 10*3/uL (ref 0.1–1.0)
Monocytes Relative: 10 % (ref 3–12)
Neutro Abs: 4 10*3/uL (ref 1.7–7.7)
Neutrophils Relative %: 57 % (ref 43–77)
PLATELETS: 207 10*3/uL (ref 150–400)
RBC: 4.11 MIL/uL (ref 3.87–5.11)
RDW: 14.5 % (ref 11.5–15.5)
WBC: 7 10*3/uL (ref 4.0–10.5)

## 2014-01-18 MED ORDER — ONDANSETRON 4 MG PO TBDP
ORAL_TABLET | ORAL | Status: DC
Start: 2014-01-18 — End: 2014-01-24

## 2014-01-18 MED ORDER — DICYCLOMINE HCL 20 MG PO TABS: ORAL_TABLET | ORAL | Status: DC

## 2014-01-18 MED ORDER — DICYCLOMINE HCL 10 MG/ML IM SOLN
20.0000 mg | Freq: Once | INTRAMUSCULAR | Status: AC
  Administered 2014-01-18: 20 mg via INTRAMUSCULAR
  Filled 2014-01-18: qty 2

## 2014-01-18 NOTE — ED Notes (Signed)
Having diarrhea, fluid retention, and my heart feels like it is fluttering per pt.

## 2014-01-18 NOTE — ED Notes (Signed)
Discharge instructions given and reviewed with patient.  Prescriptions given for Bentyl and Zofran; effects and use explained.  Patient verbalized understanding to take medications and to keep scheduled appointment.  Patient discharged in stable condition via wheelchair.

## 2014-01-18 NOTE — ED Notes (Signed)
Dr. Estell Harpin in to speak with patient.

## 2014-01-18 NOTE — Discharge Instructions (Signed)
Follow up as planned with your doctors.

## 2014-01-18 NOTE — ED Provider Notes (Signed)
CSN: [id number]     Arrival date & time 01/18/14  1922 History  This chart was scribed for Benny Lennert, MD by Bennett Scrape, ED Scribe. This patient was seen in room APA03/APA03 and the patient's care was started at 7:55 PM.    Chief Complaint  Patient presents with  . Abdominal Pain  . Leg Swelling     Patient is a 48 y.o. female presenting with diarrhea. The history is provided by the patient. No language interpreter was used.  Diarrhea Quality:  Watery Severity:  Moderate Timing:  Constant Progression:  Unchanged Relieved by:  Nothing Worsened by:  Nothing tried Associated symptoms: no abdominal pain and no headaches     HPI Comments: Kim [name] is a 48 y.o. female who is on 2L [place] continuously presents to the Emergency Department complaining of diarrhea that has been ongoing for the past 2 weeks. She was admitted on 01/08/14 for the same with a negative EGD performed 2 days later. Pt states that she has followed up with PCP with no improvement. She states that she has an appointment on March 9th, 2015 with GI but doesn't want to wait that long. She also reports that "my CHF is acting up" due to "orange looking pee" and urinary retention today. She denies any other symptoms.  Pt has been seen in the ED several times before and is non-complaint with her medications.  Past Medical History  Diagnosis Date  . Pacemaker     vent paced  . COPD (chronic obstructive pulmonary disease)   . CHF (congestive heart failure)   . Thyroid disease   . Hepatitis     Hep C  . MI (myocardial infarction)   . Pneumonia   . Dysrhythmia     atrial fibrilation  . Cirrhosis   . Shortness of breath   . Diabetes mellitus   . Chest pain   . GERD (gastroesophageal reflux disease)   . Headache(784.0)   . Anxiety   . Permanent atrial fibrillation   . Chronic pain   . PE (pulmonary embolism)    Past Surgical History  Procedure Laterality Date  . Cholecystectomy    . Pacemaker insertion   02/2012    Biotronik PPM   . Cardiac electrophysiology study and ablation    . Esophagogastroduodenoscopy  Jan 2010    Dr. Arlyce Dice: multiple gastric erosions, benign path  . Esophagogastroduodenoscopy N/A 01/10/2014    Procedure: ESOPHAGOGASTRODUODENOSCOPY (EGD);  Surgeon: Corbin Ade, MD;  Location: [facility];  Service: Endoscopy;  Laterality: N/A;  DIAGNOSTIC ONLY. PATIENT IS ON COUMADIN.    Family History  Problem Relation Age of Onset  . Coronary artery disease Father 71  . Coronary artery disease Mother 72  . Coronary artery disease Brother   . Colon cancer Neg Hx    History  Substance Use Topics  . Smoking status: Current Every Day Smoker -- 0.50 packs/day for 30 years    Types: Cigarettes  . Smokeless tobacco: Current User    Types: Snuff  . Alcohol Use: No   No OB history provided.  Review of Systems  Constitutional: Negative for appetite change and fatigue.  HENT: Negative for congestion, ear discharge and sinus pressure.   Eyes: Negative for discharge.  Respiratory: Negative for cough.   Cardiovascular: Negative for chest pain.  Gastrointestinal: Positive for diarrhea. Negative for abdominal pain.  Genitourinary: Positive for difficulty urinating. Negative for frequency and hematuria.  Musculoskeletal: Negative for back pain.  Skin: Negative for rash.  Neurological: Negative for seizures and headaches.  Psychiatric/Behavioral: Negative for hallucinations.      Allergies  Nitroglycerin; Doxycycline; Flexeril; Ibuprofen; Tramadol; Tylenol; Vesicare; and Amoxil  Home Medications   Current Outpatient Rx  Name  Route  Sig  Dispense  Refill  . albuterol (PROVENTIL HFA;VENTOLIN HFA) 108 (90 BASE) MCG/ACT inhaler   Inhalation   Inhale 2 puffs into the lungs every 6 (six) hours as needed for wheezing or shortness of breath.          Marland Kitchen. atorvastatin (LIPITOR) 80 MG tablet   Oral   Take 1 tablet (80 mg total) by mouth at bedtime.   30 tablet   0   .  budesonide-formoterol (SYMBICORT) 80-4.5 MCG/ACT inhaler   Inhalation   Inhale 2 puffs into the lungs 2 (two) times daily.         . furosemide (LASIX) 80 MG tablet   Oral   Take 1 tablet (80 mg total) by mouth 2 (two) times daily.   60 tablet   0   . magnesium oxide (MAG-OX) 400 MG tablet   Oral   Take 1 tablet (400 mg total) by mouth daily.   30 tablet   0   . metoprolol (LOPRESSOR) 50 MG tablet   Oral   Take 25 mg by mouth 3 (three) times daily.         . pantoprazole (PROTONIX) 40 MG tablet   Oral   Take 1 tablet (40 mg total) by mouth 2 (two) times daily before a meal.   60 tablet   0   . potassium chloride (K-DUR,KLOR-CON) 10 MEQ tablet   Oral   Take 4 tablets (40 mEq total) by mouth daily.   30 tablet   0   . QUEtiapine (SEROQUEL) 25 MG tablet   Oral   Take 50 mg by mouth 2 (two) times daily.         . sertraline (ZOLOFT) 50 MG tablet   Oral   Take 1 tablet (50 mg total) by mouth daily.   30 tablet   0   . sucralfate (CARAFATE) 1 GM/10ML suspension   Oral   Take 10 mLs (1 g total) by mouth 4 (four) times daily -  with meals and at bedtime.   420 mL   0   . tiotropium (SPIRIVA) 18 MCG inhalation capsule   Inhalation   Place 18 mcg into inhaler and inhale daily.         . traZODone (DESYREL) 100 MG tablet   Oral   Take 1 tablet (100 mg total) by mouth at bedtime.   30 tablet   0   . warfarin (COUMADIN) 4 MG tablet   Oral   Take 4 mg by mouth at bedtime.          Triage Vitals: BP 144/89  Pulse 74  Temp(Src) 97.6 F (36.4 C) (Oral)  Resp 20  Ht 5\' 9"  (1.753 m)  Wt 240 lb (108.863 kg)  BMI 35.43 kg/m2  SpO2 97%  LMP 02/08/2007  Physical Exam  Nursing note and vitals reviewed. Constitutional: She is oriented to person, place, and time. She appears well-developed and well-nourished.  HENT:  Head: Normocephalic and atraumatic.  Eyes: Conjunctivae and EOM are normal. No scleral icterus.  Neck: Neck supple. No thyromegaly  present.  Cardiovascular: Normal rate and regular rhythm.  Exam reveals no gallop and no friction rub.   No murmur heard. Pulmonary/Chest: Effort normal  and breath sounds normal. No stridor. She has no wheezes. She has no rales. She exhibits no tenderness.  Abdominal: She exhibits no distension. There is tenderness (minimal RLQ). There is no rebound.  Musculoskeletal: Normal range of motion. She exhibits no edema.  Lymphadenopathy:    She has no cervical adenopathy.  Neurological: She is alert and oriented to person, place, and time. She exhibits normal muscle tone. Coordination normal.  Skin: Skin is warm and dry. No rash noted. No erythema.  Psychiatric: She has a normal mood and affect. Her behavior is normal.    ED Course  Procedures (including critical care time)  Medications  dicyclomine (BENTYL) injection 20 mg (not administered)    DIAGNOSTIC STUDIES: Oxygen Saturation is 97% on RA, adequate by my interpretation.    COORDINATION OF CARE: 7:57 PM-Discussed treatment plan which includes bentyl, CBC panel and CMP with pt at bedside and pt agreed to plan.   9:35 PM-Informed pt of radiology and lab work results. Discussed discharge plan with pt and pt agreed to plan. Also advised pt to follow up as needed and pt agreed. Addressed symptoms to return for with pt.   Labs Review Labs Reviewed  CBC WITH DIFFERENTIAL - Abnormal; Notable for the following:    MCV 101.0 (*)    MCH 34.1 (*)    All other components within normal limits  BASIC METABOLIC PANEL   Imaging Review No results found.  EKG Interpretation   None       MDM   Final diagnoses:  None   Chronic abd pain.   Pt to follow up with gi as planned The chart was scribed for me under my direct supervision.  I personally performed the history, physical, and medical decision making and all procedures in the evaluation of this patient.Benny Lennert, MD 01/18/14 2138

## 2014-01-23 ENCOUNTER — Other Ambulatory Visit: Payer: Self-pay

## 2014-01-24 ENCOUNTER — Emergency Department (HOSPITAL_COMMUNITY): Payer: Medicaid Other

## 2014-01-24 ENCOUNTER — Emergency Department (HOSPITAL_COMMUNITY)
Admission: EM | Admit: 2014-01-24 | Discharge: 2014-01-25 | Disposition: A | Discharge status: Home / Self Care | Payer: Medicaid Other | Attending: Emergency Medicine | Admitting: Emergency Medicine

## 2014-01-24 ENCOUNTER — Encounter (HOSPITAL_COMMUNITY): Payer: Self-pay | Admitting: Emergency Medicine

## 2014-01-24 DIAGNOSIS — Y929 Unspecified place or not applicable: Secondary | ICD-10-CM | POA: Insufficient documentation

## 2014-01-24 DIAGNOSIS — Z880 Allergy status to penicillin: Secondary | ICD-10-CM | POA: Insufficient documentation

## 2014-01-24 DIAGNOSIS — S7000XA Contusion of unspecified hip, initial encounter: Secondary | ICD-10-CM | POA: Insufficient documentation

## 2014-01-24 DIAGNOSIS — S0990XA Unspecified injury of head, initial encounter: Secondary | ICD-10-CM | POA: Insufficient documentation

## 2014-01-24 DIAGNOSIS — I4891 Unspecified atrial fibrillation: Secondary | ICD-10-CM | POA: Insufficient documentation

## 2014-01-24 DIAGNOSIS — F411 Generalized anxiety disorder: Secondary | ICD-10-CM | POA: Insufficient documentation

## 2014-01-24 DIAGNOSIS — Z7901 Long term (current) use of anticoagulants: Secondary | ICD-10-CM | POA: Insufficient documentation

## 2014-01-24 DIAGNOSIS — Z8619 Personal history of other infectious and parasitic diseases: Secondary | ICD-10-CM | POA: Insufficient documentation

## 2014-01-24 DIAGNOSIS — IMO0002 Reserved for concepts with insufficient information to code with codable children: Secondary | ICD-10-CM | POA: Insufficient documentation

## 2014-01-24 DIAGNOSIS — W010XXA Fall on same level from slipping, tripping and stumbling without subsequent striking against object, initial encounter: Secondary | ICD-10-CM | POA: Insufficient documentation

## 2014-01-24 DIAGNOSIS — Y9389 Activity, other specified: Secondary | ICD-10-CM | POA: Insufficient documentation

## 2014-01-24 DIAGNOSIS — K219 Gastro-esophageal reflux disease without esophagitis: Secondary | ICD-10-CM | POA: Insufficient documentation

## 2014-01-24 DIAGNOSIS — Z8701 Personal history of pneumonia (recurrent): Secondary | ICD-10-CM | POA: Insufficient documentation

## 2014-01-24 DIAGNOSIS — I509 Heart failure, unspecified: Secondary | ICD-10-CM | POA: Insufficient documentation

## 2014-01-24 DIAGNOSIS — F17200 Nicotine dependence, unspecified, uncomplicated: Secondary | ICD-10-CM | POA: Insufficient documentation

## 2014-01-24 DIAGNOSIS — G8929 Other chronic pain: Secondary | ICD-10-CM | POA: Insufficient documentation

## 2014-01-24 DIAGNOSIS — Z86711 Personal history of pulmonary embolism: Secondary | ICD-10-CM | POA: Insufficient documentation

## 2014-01-24 DIAGNOSIS — E119 Type 2 diabetes mellitus without complications: Secondary | ICD-10-CM | POA: Insufficient documentation

## 2014-01-24 DIAGNOSIS — Z79899 Other long term (current) drug therapy: Secondary | ICD-10-CM | POA: Insufficient documentation

## 2014-01-24 DIAGNOSIS — Z950 Presence of cardiac pacemaker: Secondary | ICD-10-CM | POA: Insufficient documentation

## 2014-01-24 DIAGNOSIS — I252 Old myocardial infarction: Secondary | ICD-10-CM | POA: Insufficient documentation

## 2014-01-24 DIAGNOSIS — J441 Chronic obstructive pulmonary disease with (acute) exacerbation: Secondary | ICD-10-CM | POA: Insufficient documentation

## 2014-01-24 MED ORDER — OXYCODONE HCL 5 MG PO TABS
5.0000 mg | ORAL_TABLET | Freq: Once | ORAL | Status: AC
  Administered 2014-01-24: 5 mg via ORAL
  Filled 2014-01-24: qty 1

## 2014-01-24 NOTE — ED Provider Notes (Signed)
CSN: [id number]     Arrival date & time 01/24/14  2121 History   First MD Initiated Contact with Patient 01/24/14 2247     Chief Complaint  Patient presents with  . Hip Pain     (Consider location/radiation/quality/duration/timing/severity/associated sxs/prior Treatment) HPI Comments: The patient is a 48 year old [name] who presents to the emergency department with complaint of right hip pain. The patient states that on yesterday February 17 she slipped on ice and injured the right hip. The patient states she's been having more and more pain in that hip area since that time. The patient also states that she thinks she may be holding onto" a little bit of fluid". She feels that maybe her breathing is not as well as it has been. It is of note that the patient has a history of congestive heart failure, complicated by chronic obstructive pulmonary disease. Patient also suffers from chronic pain, and feels that she is aggravated this by the fall.  Patient is a 48 y.o. [name] presenting with hip pain. The history is provided by the patient.  Hip Pain Associated symptoms include arthralgias and headaches. Pertinent negatives include no abdominal pain, chest pain, coughing or neck pain.    Past Medical History  Diagnosis Date  . Pacemaker     vent paced  . COPD (chronic obstructive pulmonary disease)   . CHF (congestive heart failure)   . Thyroid disease   . Hepatitis     Hep C  . MI (myocardial infarction)   . Pneumonia   . Dysrhythmia     atrial fibrilation  . Cirrhosis   . Shortness of breath   . Diabetes mellitus   . Chest pain   . GERD (gastroesophageal reflux disease)   . Headache(784.0)   . Anxiety   . Permanent atrial fibrillation   . Chronic pain   . PE (pulmonary embolism)    Past Surgical History  Procedure Laterality Date  . Cholecystectomy    . Pacemaker insertion  02/2012    Biotronik PPM   . Cardiac electrophysiology study and ablation    .  Esophagogastroduodenoscopy  Jan 2010    Dr. Arlyce Dice: multiple gastric erosions, benign path  . Esophagogastroduodenoscopy N/A 01/10/2014    Procedure: ESOPHAGOGASTRODUODENOSCOPY (EGD);  Surgeon: Corbin Ade, MD;  Location: [facility];  Service: Endoscopy;  Laterality: N/A;  DIAGNOSTIC ONLY. PATIENT IS ON COUMADIN.    Family History  Problem Relation Age of Onset  . Coronary artery disease Father 52  . Coronary artery disease Mother 23  . Coronary artery disease Brother   . Colon cancer Neg Hx    History  Substance Use Topics  . Smoking status: Current Every Day Smoker -- 0.50 packs/day for 30 years    Types: Cigarettes  . Smokeless tobacco: Current User    Types: Snuff  . Alcohol Use: No   OB History   Grav Para Term Preterm Abortions TAB SAB Ect Mult Living                 Review of Systems  Constitutional: Negative for activity change.       All ROS Neg except as noted in HPI  HENT: Negative for nosebleeds.   Eyes: Negative for photophobia and discharge.  Respiratory: Positive for shortness of breath. Negative for cough and wheezing.   Cardiovascular: Negative for chest pain and palpitations.  Gastrointestinal: Negative for abdominal pain and blood in stool.  Genitourinary: Negative for dysuria, frequency and hematuria.  Musculoskeletal: Positive for arthralgias and back pain. Negative for neck pain.  Skin: Negative.   Neurological: Positive for headaches. Negative for dizziness, seizures and speech difficulty.  Psychiatric/Behavioral: Negative for hallucinations and confusion. The patient is nervous/anxious.       Allergies  Nitroglycerin; Doxycycline; Flexeril; Ibuprofen; Tramadol; Tylenol; Vesicare; and Amoxil  Home Medications   Current Outpatient Rx  Name  Route  Sig  Dispense  Refill  . albuterol (PROVENTIL HFA;VENTOLIN HFA) 108 (90 BASE) MCG/ACT inhaler   Inhalation   Inhale 2 puffs into the lungs every 6 (six) hours as needed for wheezing or shortness  of breath.          Marland Kitchen. atorvastatin (LIPITOR) 80 MG tablet   Oral   Take 1 tablet (80 mg total) by mouth at bedtime.   30 tablet   0   . budesonide-formoterol (SYMBICORT) 80-4.5 MCG/ACT inhaler   Inhalation   Inhale 2 puffs into the lungs 2 (two) times daily.         Marland Kitchen. dicyclomine (BENTYL) 20 MG tablet   Oral   Take 20 mg by mouth 3 (three) times daily before meals. Take one every 6 hours for abd cramps         . esomeprazole (NEXIUM) 40 MG capsule   Oral   Take 40 mg by mouth daily at 12 noon.         . furosemide (LASIX) 80 MG tablet   Oral   Take 1 tablet (80 mg total) by mouth 2 (two) times daily.   60 tablet   0   . metoprolol (LOPRESSOR) 50 MG tablet   Oral   Take 25 mg by mouth 3 (three) times daily.         . OxyCODONE (OXYCONTIN) 10 mg T12A 12 hr tablet   Oral   Take 10 mg by mouth every 12 (twelve) hours.         Marland Kitchen. QUEtiapine (SEROQUEL) 25 MG tablet   Oral   Take 50 mg by mouth 2 (two) times daily.         Marland Kitchen. tiotropium (SPIRIVA) 18 MCG inhalation capsule   Inhalation   Place 18 mcg into inhaler and inhale daily.         Marland Kitchen. warfarin (COUMADIN) 4 MG tablet   Oral   Take 4 mg by mouth at bedtime.          BP 168/89  Pulse 79  Temp(Src) 97.4 F (36.3 C) (Oral)  Resp 18  SpO2 96%  LMP 02/08/2007 Physical Exam  Nursing note and vitals reviewed. Constitutional: She is oriented to person, place, and time. She appears well-developed and well-nourished.  Non-toxic appearance.  HENT:  Head: Normocephalic.  Right Ear: Tympanic membrane and external ear normal.  Left Ear: Tympanic membrane and external ear normal.  Eyes: EOM and lids are normal. Pupils are equal, round, and reactive to light.  Neck: Normal range of motion. Neck supple. Carotid bruit is not present.  Cardiovascular: Normal rate, regular rhythm, normal heart sounds, intact distal pulses and normal pulses.   Pulmonary/Chest: Breath sounds normal. No respiratory distress. She  has no wheezes. She has no rales.  Abdominal: Soft. Bowel sounds are normal. There is no tenderness. There is no guarding.  Musculoskeletal: Normal range of motion.  There is soreness with attempted range of motion of the right hip. There is no shortening appreciated of either of the lower extremities. There is no palpable deformity noted of the hip  area. There is no bruise or injury noted to the knee, tibia/fibula area, or foot on the right.  There is no pitting edema noted of the right or left lower extremity. The capillary refill is less than 2 seconds on the right and left.  Lymphadenopathy:       Head (right side): No submandibular adenopathy present.       Head (left side): No submandibular adenopathy present.    She has no cervical adenopathy.  Neurological: She is alert and oriented to person, place, and time. She has normal strength. No cranial nerve deficit or sensory deficit.  Skin: Skin is warm and dry.  Psychiatric: She has a normal mood and affect. Her speech is normal.    ED Course  Procedures (including critical care time) Labs Review Labs Reviewed - No data to display Imaging Review Dg Hip Complete Right  01/24/2014   CLINICAL DATA:  Hip pain.  Fall.  EXAM: RIGHT HIP - COMPLETE 2+ VIEW  COMPARISON:  None.  FINDINGS: There is no evidence of hip fracture or dislocation. There is no evidence of arthropathy or other focal bone abnormality. SI joints and hip joints are symmetric and unremarkable.  IMPRESSION: Negative.   Electronically Signed   By: Charlett Nose M.D.   On: 01/24/2014 22:38    EKG Interpretation   None       MDM The lungs are mostly clear. There no rales or wheezes appreciated. There is no labored respirations noted. The patient speaks in complete sentences without problem. The patient's weight today is 240 pounds. I am going back to be checked the records and she has previously been 239.1 pounds. No clinical or other evidence of significant fluid retention.  The patient is ambulatory in the [facility] without significant problem.  The x-ray of the right hip and pelvis is negative for fractures or dislocations. Again the patient is ambulatory with minimal problem.  The plan at this time is to treat the patient here in the emergency department. The patient was given a Roxicodone tablet 5 mg here in the emergency department. I've discussed discharge plans with the patient for the patient to be seen by her primary physician for her pain management. Patient acknowledges understanding of the discharge plan.  Upon discharge the patient request" a few tablets" so she can get to the doctor's office on tomorrow. Prescription for 3 tablets a Roxicodone 5 mg given to the patient. I have emphasized to the patient however that she will need to have her pain control by her primary physician.    Final diagnoses:  None    *I have reviewed nursing notes, vital signs, and all appropriate lab and imaging results for this patient.Kathie Dike, PA-C 01/25/14 [phone]

## 2014-01-24 NOTE — Discharge Instructions (Signed)
The x-rays of your hip and pelvis are negative for fracture or dislocation. Your vital signs are well within normal limits. You were treated in the emergency department with an oxycodone tablet. Please use caution getting around tonight. Please see [name] for additional  management of your pain and discomfort.

## 2014-01-24 NOTE — ED Notes (Addendum)
Pt reporting pain in right hip after slipping on ice yesterday.  Pt ambulatory with steady gait.  Pt also states that she believes she has " a little fluid on".  Reporting some SOB.

## 2014-01-27 NOTE — ED Provider Notes (Signed)
Medical screening examination/treatment/procedure(s) were performed by non-physician practitioner and as supervising physician I was immediately available for consultation/collaboration.  EKG Interpretation   None       Devoria Albe, MD, Armando Gang   Ward Givens, MD 01/27/14 [phone]

## 2014-02-02 DIAGNOSIS — Z8701 Personal history of pneumonia (recurrent): Secondary | ICD-10-CM | POA: Insufficient documentation

## 2014-02-02 DIAGNOSIS — I4891 Unspecified atrial fibrillation: Secondary | ICD-10-CM | POA: Insufficient documentation

## 2014-02-02 DIAGNOSIS — G8929 Other chronic pain: Secondary | ICD-10-CM | POA: Insufficient documentation

## 2014-02-02 DIAGNOSIS — Z79899 Other long term (current) drug therapy: Secondary | ICD-10-CM | POA: Insufficient documentation

## 2014-02-02 DIAGNOSIS — Z950 Presence of cardiac pacemaker: Secondary | ICD-10-CM | POA: Insufficient documentation

## 2014-02-02 DIAGNOSIS — R197 Diarrhea, unspecified: Secondary | ICD-10-CM | POA: Insufficient documentation

## 2014-02-02 DIAGNOSIS — Z9981 Dependence on supplemental oxygen: Secondary | ICD-10-CM | POA: Insufficient documentation

## 2014-02-02 DIAGNOSIS — K219 Gastro-esophageal reflux disease without esophagitis: Secondary | ICD-10-CM | POA: Insufficient documentation

## 2014-02-02 DIAGNOSIS — F411 Generalized anxiety disorder: Secondary | ICD-10-CM | POA: Insufficient documentation

## 2014-02-02 DIAGNOSIS — Z9119 Patient's noncompliance with other medical treatment and regimen: Secondary | ICD-10-CM | POA: Insufficient documentation

## 2014-02-02 DIAGNOSIS — IMO0002 Reserved for concepts with insufficient information to code with codable children: Secondary | ICD-10-CM | POA: Insufficient documentation

## 2014-02-02 DIAGNOSIS — F17200 Nicotine dependence, unspecified, uncomplicated: Secondary | ICD-10-CM | POA: Insufficient documentation

## 2014-02-02 DIAGNOSIS — R791 Abnormal coagulation profile: Secondary | ICD-10-CM | POA: Insufficient documentation

## 2014-02-02 DIAGNOSIS — Z880 Allergy status to penicillin: Secondary | ICD-10-CM | POA: Insufficient documentation

## 2014-02-02 DIAGNOSIS — Z8619 Personal history of other infectious and parasitic diseases: Secondary | ICD-10-CM | POA: Insufficient documentation

## 2014-02-02 DIAGNOSIS — I252 Old myocardial infarction: Secondary | ICD-10-CM | POA: Insufficient documentation

## 2014-02-02 DIAGNOSIS — J441 Chronic obstructive pulmonary disease with (acute) exacerbation: Secondary | ICD-10-CM | POA: Insufficient documentation

## 2014-02-02 DIAGNOSIS — Z91199 Patient's noncompliance with other medical treatment and regimen due to unspecified reason: Secondary | ICD-10-CM | POA: Insufficient documentation

## 2014-02-02 DIAGNOSIS — I509 Heart failure, unspecified: Secondary | ICD-10-CM | POA: Insufficient documentation

## 2014-02-02 DIAGNOSIS — Z86711 Personal history of pulmonary embolism: Secondary | ICD-10-CM | POA: Insufficient documentation

## 2014-02-03 ENCOUNTER — Encounter (HOSPITAL_COMMUNITY): Payer: Self-pay | Admitting: Emergency Medicine

## 2014-02-03 ENCOUNTER — Emergency Department (HOSPITAL_COMMUNITY): Payer: Medicaid Other

## 2014-02-03 ENCOUNTER — Emergency Department (HOSPITAL_COMMUNITY)
Admission: EM | Admit: 2014-02-03 | Discharge: 2014-02-03 | Disposition: A | Discharge status: Home / Self Care | Payer: Medicaid Other | Attending: Emergency Medicine | Admitting: Emergency Medicine

## 2014-02-03 DIAGNOSIS — R079 Chest pain, unspecified: Secondary | ICD-10-CM

## 2014-02-03 DIAGNOSIS — R791 Abnormal coagulation profile: Secondary | ICD-10-CM

## 2014-02-03 DIAGNOSIS — R05 Cough: Secondary | ICD-10-CM

## 2014-02-03 DIAGNOSIS — I509 Heart failure, unspecified: Secondary | ICD-10-CM

## 2014-02-03 DIAGNOSIS — J449 Chronic obstructive pulmonary disease, unspecified: Secondary | ICD-10-CM

## 2014-02-03 DIAGNOSIS — R059 Cough, unspecified: Secondary | ICD-10-CM

## 2014-02-03 LAB — BASIC METABOLIC PANEL
BUN: 9 mg/dL (ref 6–23)
CO2: 35 mEq/L — ABNORMAL HIGH (ref 19–32)
CREATININE: 0.7 mg/dL (ref 0.50–1.10)
Calcium: 8.9 mg/dL (ref 8.4–10.5)
Chloride: 100 mEq/L (ref 96–112)
GFR calc Af Amer: >90 mL/min (ref >90)
GLUCOSE: 117 mg/dL — AB (ref 70–99)
POTASSIUM: 3.7 meq/L (ref 3.7–5.3)
Sodium: 144 mEq/L (ref 137–147)

## 2014-02-03 LAB — CBC
HCT: 42.2 % (ref 36.0–46.0)
Hemoglobin: 14.4 g/dL (ref 12.0–15.0)
MCH: 34.5 pg — AB (ref 26.0–34.0)
MCHC: 34.1 g/dL (ref 30.0–36.0)
MCV: 101.2 fL — ABNORMAL HIGH (ref 78.0–100.0)
Platelets: 226 10*3/uL (ref 150–400)
RBC: 4.17 MIL/uL (ref 3.87–5.11)
RDW: 14 % (ref 11.5–15.5)
WBC: 8.5 10*3/uL (ref 4.0–10.5)

## 2014-02-03 LAB — I-STAT TROPONIN, ED: Troponin i, poc: 0.09 ng/mL (ref 0.00–0.08)

## 2014-02-03 LAB — PROTIME-INR
INR: 1.22 (ref 0.00–1.49)
Prothrombin Time: 15.1 seconds (ref 11.6–15.2)

## 2014-02-03 LAB — TROPONIN I

## 2014-02-03 LAB — TYPE AND SCREEN
ABO/RH(D): A POS
Antibody Screen: NEGATIVE

## 2014-02-03 LAB — PRO B NATRIURETIC PEPTIDE: Pro B Natriuretic peptide (BNP): 1995 pg/mL — ABNORMAL HIGH (ref 0–125)

## 2014-02-03 MED ORDER — MORPHINE SULFATE 4 MG/ML IJ SOLN
4.0000 mg | Freq: Once | INTRAMUSCULAR | Status: AC
  Administered 2014-02-03: 4 mg via INTRAVENOUS
  Filled 2014-02-03: qty 1

## 2014-02-03 MED ORDER — FUROSEMIDE 10 MG/ML IJ SOLN
40.0000 mg | Freq: Once | INTRAMUSCULAR | Status: AC
  Administered 2014-02-03: 40 mg via INTRAVENOUS
  Filled 2014-02-03: qty 4

## 2014-02-03 MED ORDER — IPRATROPIUM-ALBUTEROL 0.5-2.5 (3) MG/3ML IN SOLN
3.0000 mL | RESPIRATORY_TRACT | Status: DC
  Administered 2014-02-03 (×2): 3 mL via RESPIRATORY_TRACT
  Filled 2014-02-03 (×2): qty 3

## 2014-02-03 MED ORDER — MORPHINE SULFATE 2 MG/ML IJ SOLN
2.0000 mg | Freq: Once | INTRAMUSCULAR | Status: AC
  Administered 2014-02-03: 2 mg via INTRAVENOUS
  Filled 2014-02-03: qty 1

## 2014-02-03 MED ORDER — ONDANSETRON HCL 4 MG/2ML IJ SOLN
4.0000 mg | Freq: Once | INTRAMUSCULAR | Status: AC
  Administered 2014-02-03: 4 mg via INTRAVENOUS
  Filled 2014-02-03: qty 2

## 2014-02-03 MED ORDER — FUROSEMIDE 40 MG PO TABS: 80.0000 mg | ORAL_TABLET | Freq: Two times a day (BID) | ORAL | Status: DC

## 2014-02-03 NOTE — ED Notes (Signed)
Pt alert & oriented x4, stable gait. Patient given discharge instructions, paperwork & prescription(s). Patient  instructed to stop at the registration desk to finish any additional paperwork. Patient verbalized understanding. Pt left department w/ no further questions. 

## 2014-02-03 NOTE — ED Notes (Signed)
Pt reporting continued SOB and cough.  Reports that symptoms have been worse today.

## 2014-02-03 NOTE — ED Provider Notes (Signed)
CSN: [id number]     Arrival date & time 02/02/14  2315 History   First MD Initiated Contact with Patient 02/03/14 0206     Chief Complaint  Patient presents with  . Cough  . Shortness of Breath     Patient is a 48 y.o. female presenting with cough. The history is provided by the patient.  Cough Severity:  Moderate Onset quality:  Gradual Duration:  1 week Timing:  Intermittent Progression:  Worsening Chronicity:  Recurrent Relieved by:  Nothing Worsened by:  Activity Associated symptoms: shortness of breath and wheezing   Associated symptoms: no fever   Associated symptoms comment:  Chest pain with cough  PT is here for multiple complaints  She reports cough/wheezing/shortness of breath for past week She reports due to coughing, she has Chest wall pain.   She also reports abd pain due to coughing  She also mentions recent diarrhea with dark/tarry stools. No vomiting is reported She reports she has had this previously  She is a smoker She is on oxygen at home [facility]  She reports coumadin compliance but she admits to not having her home lasix She is scheduled to see her PCP in 2 days   Past Medical History  Diagnosis Date  . Pacemaker     vent paced  . COPD (chronic obstructive pulmonary disease)   . CHF (congestive heart failure)   . Thyroid disease   . Hepatitis     Hep C  . MI (myocardial infarction)   . Pneumonia   . Dysrhythmia     atrial fibrilation  . Cirrhosis   . Shortness of breath   . Diabetes mellitus   . Chest pain   . GERD (gastroesophageal reflux disease)   . Headache(784.0)   . Anxiety   . Permanent atrial fibrillation   . Chronic pain   . PE (pulmonary embolism)    Past Surgical History  Procedure Laterality Date  . Cholecystectomy    . Pacemaker insertion  02/2012    Biotronik PPM   . Cardiac electrophysiology study and ablation    . Esophagogastroduodenoscopy  Jan 2010    Dr. Arlyce DiceKaplan: multiple gastric erosions, benign path  .  Esophagogastroduodenoscopy N/A 01/10/2014    Procedure: ESOPHAGOGASTRODUODENOSCOPY (EGD);  Surgeon: Corbin Adeobert M Rourk, MD;  Location: [facility];  Service: Endoscopy;  Laterality: N/A;  DIAGNOSTIC ONLY. PATIENT IS ON COUMADIN.    Family History  Problem Relation Age of Onset  . Coronary artery disease Father 3263  . Coronary artery disease Mother 3275  . Coronary artery disease Brother   . Colon cancer Neg Hx    History  Substance Use Topics  . Smoking status: Current Every Day Smoker -- 0.50 packs/day for 30 years    Types: Cigarettes  . Smokeless tobacco: Current User    Types: Snuff  . Alcohol Use: No   OB History   Grav Para Term Preterm Abortions TAB SAB Ect Mult Living                 Review of Systems  Constitutional: Negative for fever.  Respiratory: Positive for cough, shortness of breath and wheezing.   Cardiovascular:       Chest pain with cough   Gastrointestinal: Positive for diarrhea.  Neurological: Negative for syncope.  All other systems reviewed and are negative.      Allergies  Nitroglycerin; Doxycycline; Flexeril; Ibuprofen; Tramadol; Tylenol; Vesicare; and Amoxil  Home Medications   Current Outpatient Rx  Name  Route  Sig  Dispense  Refill  . albuterol (PROVENTIL HFA;VENTOLIN HFA) 108 (90 BASE) MCG/ACT inhaler   Inhalation   Inhale 2 puffs into the lungs every 6 (six) hours as needed for wheezing or shortness of breath.          Marland Kitchen atorvastatin (LIPITOR) 80 MG tablet   Oral   Take 1 tablet (80 mg total) by mouth at bedtime.   30 tablet   0   . budesonide-formoterol (SYMBICORT) 80-4.5 MCG/ACT inhaler   Inhalation   Inhale 2 puffs into the lungs 2 (two) times daily.         Marland Kitchen esomeprazole (NEXIUM) 40 MG capsule   Oral   Take 40 mg by mouth daily at 12 noon.         . furosemide (LASIX) 80 MG tablet   Oral   Take 1 tablet (80 mg total) by mouth 2 (two) times daily.   60 tablet   0   . metoprolol (LOPRESSOR) 50 MG tablet   Oral    Take 25 mg by mouth 3 (three) times daily.         . OxyCODONE (OXYCONTIN) 10 mg T12A 12 hr tablet   Oral   Take 10 mg by mouth every 12 (twelve) hours.         Marland Kitchen QUEtiapine (SEROQUEL) 25 MG tablet   Oral   Take 50 mg by mouth 2 (two) times daily.         Marland Kitchen tiotropium (SPIRIVA) 18 MCG inhalation capsule   Inhalation   Place 18 mcg into inhaler and inhale daily.         Marland Kitchen warfarin (COUMADIN) 4 MG tablet   Oral   Take 4 mg by mouth at bedtime.          BP 162/103  Pulse 84  Temp(Src) 98.7 F (37.1 C) (Oral)  Resp 20  Wt 240 lb (108.863 kg)  SpO2 99%  LMP 02/08/2007 Physical Exam CONSTITUTIONAL: Well developed/well nourished HEAD: Normocephalic/atraumatic EYES: EOMI/PERRL ENMT: Mucous membranes moist NECK: supple no meningeal signs SPINE:entire spine nontender CV: S1/S2 noted, no murmurs/rubs/gallops noted LUNGS: wheezing noted bilaterally.  No distress, she is able to speak to me clearly ABDOMEN: soft, nontender, no rebound or guarding Rectal - stool color brown.  Hemoccult negative.  No blood or melena.  Chaperone present GU:no cva tenderness NEURO: Pt is awake/alert, moves all extremitiesx4 EXTREMITIES: pulses normal, full ROM, no peripheral edema is noted SKIN: warm, color normal PSYCH: no abnormalities of mood noted  ED Course  Procedures Labs Review Labs Reviewed  BASIC METABOLIC PANEL - Abnormal; Notable for the following:    CO2 35 (*)    Glucose, Bld 117 (*)    All other components within normal limits  CBC - Abnormal; Notable for the following:    MCV 101.2 (*)    MCH 34.5 (*)    All other components within normal limits  PRO B NATRIURETIC PEPTIDE - Abnormal; Notable for the following:    Pro B Natriuretic peptide (BNP) 1995.0 (*)    All other components within normal limits  I-STAT TROPOININ, ED - Abnormal; Notable for the following:    Troponin i, poc 0.09 (*)    All other components within normal limits  TROPONIN I  PROTIME-INR  POC  OCCULT BLOOD, ED  TYPE AND SCREEN   Imaging Review Dg Chest Port 1 View  02/03/2014   CLINICAL DATA:  Cough and shortness of breath.  EXAM:  PORTABLE CHEST - 1 VIEW  COMPARISON:  Chest radiograph from 01/08/2014  FINDINGS: The lungs are well-aerated. Vascular congestion is noted, without significant pulmonary edema. No pleural effusion or pneumothorax is seen.  The cardiomediastinal silhouette is mildly enlarged. A pacemaker is noted overlying the left chest wall, with leads ending overlying the right atrium and right ventricle. No acute osseous abnormalities are seen.  IMPRESSION: Vascular congestion and mild cardiomegaly, without significant pulmonary edema.   Electronically Signed   By: Roanna Raider M.D.   On: 02/03/2014 02:48     EKG Interpretation   Date/Time:  Saturday February 03 2014 01:25:21 EST Ventricular Rate:  75 PR Interval:    QRS Duration: 184 QT Interval:  530 QTC Calculation: 591 R Axis:   -81 Text Interpretation:  Ventricular-paced rhythm Abnormal ECG When compared  with ECG of 08-Jan-2014 22:53, Vent. rate has decreased BY   2 BPM  Confirmed by Bebe Shaggy  MD, Dorinda Hill ([id number]) on 02/03/2014 1:29:18 AM      MDM   Final diagnoses:  Cough  COPD (chronic obstructive pulmonary disease)  Chest pain  CHF (congestive heart failure)  Subtherapeutic international normalized ratio (INR)    Nursing notes including past medical history and social history reviewed and considered in documentation Labs/vital reviewed and considered Previous records reviewed and considered xrays reviewed and considered   5:03 AM   Pt here for multiple issues  For her cough - suspect COPD (given nebs) and pt appears improved.  She ambulated in the ED and did not appear in distress.  I feel she is safe/stable for d/c home.  Will defer steroids due to recent h/o esophagitis per records.  I don't feel she is in decompensated CHF at this time.  She will need to continue all home meds including  her BP meds   Lab troponin negative, only had CP with cough, doubt ACS/PE/Dissection at this time  For dark stools - no signs of exam of acute GI bleed, she is not anticoagulated and her HGB is appropriate  She did request dose of lasix here and to refill her lasix at home Advised her that her INR Was subtherapeutic (previous h/o PE) and she needs to continue home coumadin (She does not need refill) and see her PCP as scheduled in 2 days.    Joya Gaskins, MD 02/03/14 [phone]

## 2014-02-05 LAB — POC OCCULT BLOOD, ED: Fecal Occult Bld: NEGATIVE

## 2014-02-06 ENCOUNTER — Other Ambulatory Visit: Payer: Self-pay

## 2014-02-06 ENCOUNTER — Telehealth: Payer: Self-pay | Admitting: Radiology

## 2014-02-06 NOTE — Telephone Encounter (Signed)
NO SHOW on 02/06/2014.  Left message requesting patient call to Reschedule appointment for consult.   Ronny Ruddell Carmell Austria, RN 02/06/2014 11:23 AM

## 2014-02-12 ENCOUNTER — Telehealth: Payer: Self-pay | Admitting: Gastroenterology

## 2014-02-12 ENCOUNTER — Ambulatory Visit: Payer: Self-pay | Admitting: Gastroenterology

## 2014-02-12 ENCOUNTER — Encounter: Payer: Self-pay | Admitting: Gastroenterology

## 2014-02-12 NOTE — Telephone Encounter (Signed)
Pt was a no show

## 2014-02-12 NOTE — Telephone Encounter (Signed)
Mailed letter °

## 2014-02-14 ENCOUNTER — Encounter (HOSPITAL_COMMUNITY): Payer: Self-pay | Admitting: Emergency Medicine

## 2014-02-14 ENCOUNTER — Emergency Department (HOSPITAL_COMMUNITY)
Admission: EM | Admit: 2014-02-14 | Discharge: 2014-02-14 | Disposition: A | Discharge status: Home / Self Care | Payer: Medicaid Other | Attending: Emergency Medicine | Admitting: Emergency Medicine

## 2014-02-14 DIAGNOSIS — J029 Acute pharyngitis, unspecified: Secondary | ICD-10-CM | POA: Insufficient documentation

## 2014-02-14 DIAGNOSIS — K219 Gastro-esophageal reflux disease without esophagitis: Secondary | ICD-10-CM | POA: Insufficient documentation

## 2014-02-14 DIAGNOSIS — Z79899 Other long term (current) drug therapy: Secondary | ICD-10-CM | POA: Insufficient documentation

## 2014-02-14 DIAGNOSIS — G8929 Other chronic pain: Secondary | ICD-10-CM | POA: Insufficient documentation

## 2014-02-14 DIAGNOSIS — F17200 Nicotine dependence, unspecified, uncomplicated: Secondary | ICD-10-CM | POA: Insufficient documentation

## 2014-02-14 DIAGNOSIS — Z950 Presence of cardiac pacemaker: Secondary | ICD-10-CM | POA: Insufficient documentation

## 2014-02-14 DIAGNOSIS — J449 Chronic obstructive pulmonary disease, unspecified: Secondary | ICD-10-CM | POA: Insufficient documentation

## 2014-02-14 DIAGNOSIS — F411 Generalized anxiety disorder: Secondary | ICD-10-CM | POA: Insufficient documentation

## 2014-02-14 DIAGNOSIS — H9201 Otalgia, right ear: Secondary | ICD-10-CM

## 2014-02-14 DIAGNOSIS — R42 Dizziness and giddiness: Secondary | ICD-10-CM | POA: Insufficient documentation

## 2014-02-14 DIAGNOSIS — Z8619 Personal history of other infectious and parasitic diseases: Secondary | ICD-10-CM | POA: Insufficient documentation

## 2014-02-14 DIAGNOSIS — Z7901 Long term (current) use of anticoagulants: Secondary | ICD-10-CM | POA: Insufficient documentation

## 2014-02-14 DIAGNOSIS — H9209 Otalgia, unspecified ear: Secondary | ICD-10-CM | POA: Insufficient documentation

## 2014-02-14 DIAGNOSIS — I252 Old myocardial infarction: Secondary | ICD-10-CM | POA: Insufficient documentation

## 2014-02-14 DIAGNOSIS — J4489 Other specified chronic obstructive pulmonary disease: Secondary | ICD-10-CM | POA: Insufficient documentation

## 2014-02-14 DIAGNOSIS — Z9089 Acquired absence of other organs: Secondary | ICD-10-CM | POA: Insufficient documentation

## 2014-02-14 DIAGNOSIS — Z9981 Dependence on supplemental oxygen: Secondary | ICD-10-CM | POA: Insufficient documentation

## 2014-02-14 DIAGNOSIS — E119 Type 2 diabetes mellitus without complications: Secondary | ICD-10-CM | POA: Insufficient documentation

## 2014-02-14 DIAGNOSIS — I509 Heart failure, unspecified: Secondary | ICD-10-CM | POA: Insufficient documentation

## 2014-02-14 DIAGNOSIS — Z86711 Personal history of pulmonary embolism: Secondary | ICD-10-CM | POA: Insufficient documentation

## 2014-02-14 DIAGNOSIS — I4891 Unspecified atrial fibrillation: Secondary | ICD-10-CM | POA: Insufficient documentation

## 2014-02-14 DIAGNOSIS — H9190 Unspecified hearing loss, unspecified ear: Secondary | ICD-10-CM | POA: Insufficient documentation

## 2014-02-14 DIAGNOSIS — Z8701 Personal history of pneumonia (recurrent): Secondary | ICD-10-CM | POA: Insufficient documentation

## 2014-02-14 MED ORDER — ANTIPYRINE-BENZOCAINE 5.4-1.4 % OT SOLN
3.0000 [drp] | Freq: Once | OTIC | Status: AC
  Administered 2014-02-14: 4 [drp] via OTIC
  Filled 2014-02-14: qty 10

## 2014-02-14 NOTE — ED Notes (Signed)
Pt carried out by wheelchair, no further questions at discharge.

## 2014-02-14 NOTE — ED Notes (Addendum)
Pt states she can feel fluid behind her right ear. Pt states when she stands up she gets dizzy and has fallen twice. Pt says when she blows her nose, her right ear pops.

## 2014-02-14 NOTE — Discharge Instructions (Signed)
Otalgia Otalgia is pain in or around the ear. When the pain is from the ear itself it is called primary otalgia. Pain may also be coming from somewhere else, like the head and neck. This is called secondary otalgia.  CAUSES  Causes of primary otalgia include:  Middle ear infection.  It can also be caused by injury to the ear or infection of the ear canal (swimmer's ear). Swimmer's ear causes pain, swelling and often drainage from the ear canal. Causes of secondary otalgia include:  Sinus infections.  Allergies and colds that cause stuffiness of the nose and tubes that drain the ears (eustachian tubes).  Dental problems like cavities, gum infections or teething.  Sore Throat (tonsillitis and pharyngitis).  Swollen glands in the neck.  Infection of the bone behind the ear (mastoiditis).  TMJ discomfort (problems with the joint between your jaw and your skull).  Other problems such as nerve disorders, circulation problems, heart disease and tumors of the head and neck can also cause symptoms of ear pain. This is rare. DIAGNOSIS  Evaluation, Diagnosis and Testing:  Examination by your medical caregiver is recommended to evaluate and diagnose the cause of otalgia.  Further testing or referral to a specialist may be indicated if the cause of the ear pain is not found and the symptom persists. TREATMENT   Your doctor may prescribe antibiotics if an ear infection is diagnosed.  Pain relievers and topical analgesics may be recommended.  It is important to take all medications as prescribed. HOME CARE INSTRUCTIONS   It may be helpful to sleep with the painful ear in the up position.  A warm compress over the painful ear may provide relief.  A soft diet and avoiding gum may help while ear pain is present. SEEK IMMEDIATE MEDICAL CARE IF:  You develop severe pain, a high fever, repeated vomiting or dehydration.  You develop extreme dizziness, headache, confusion, ringing in the  ears (tinnitus) or hearing loss. Document Released: 12/31/2004 Document Revised: 02/15/2012 Document Reviewed: 10/02/2009 [facility] Patient Information 2014 [place], [place].  You may apply 3-4 drops of the medicine given in your right ear every 4 hours if needed for pain relief.

## 2014-02-14 NOTE — ED Notes (Signed)
Pt reports a filling of fluid in her right ear. Pt states she has loss her balance x 2. Pt able to get up from wheel chair & move to the stretcher w/o problem.

## 2014-02-16 NOTE — ED Provider Notes (Signed)
CSN: [id number]     Arrival date & time 02/14/14  1953 History   First MD Initiated Contact with Patient 02/14/14 2040     Chief Complaint  Patient presents with  . fluid behind ear      (Consider location/radiation/quality/duration/timing/severity/associated sxs/prior Treatment) HPI Comments: Kim [name] is a 48 y.o. Female who is well known to this emergency department,  Presenting tonight with a one week history of sensation of fluid behind her right ear along with throbbing right ear pain.  She states this sometimes makes her dizzy, stating she lost her balance twice this week, catching herself before falling.  She has had nasal congestion with clear drainage and has just recovered from a recent cold.  Her right ear "pops" when she blows her nose.  She has a history of chf and is concerned the fluid she is hearing is related to this condition.  She denies fevers or chills, headache, ear drainage,  increased shortness of breath, chest pain or peripheral edema.  She uses home O2 at baseline.     The history is provided by the patient.    Past Medical History  Diagnosis Date  . Pacemaker     vent paced  . COPD (chronic obstructive pulmonary disease)   . CHF (congestive heart failure)   . Thyroid disease   . Hepatitis     Hep C  . MI (myocardial infarction)   . Pneumonia   . Dysrhythmia     atrial fibrilation  . Cirrhosis   . Shortness of breath   . Diabetes mellitus   . Chest pain   . GERD (gastroesophageal reflux disease)   . Headache(784.0)   . Anxiety   . Permanent atrial fibrillation   . Chronic pain   . PE (pulmonary embolism)    Past Surgical History  Procedure Laterality Date  . Cholecystectomy    . Pacemaker insertion  02/2012    Biotronik PPM   . Cardiac electrophysiology study and ablation    . Esophagogastroduodenoscopy  Jan 2010    Dr. Arlyce Dice: multiple gastric erosions, benign path  . Esophagogastroduodenoscopy N/A 01/10/2014    [id number] EG  junction. Query short segment Barrett's esophagus-status post pinch biopsy as described above. Hiatal hernia; otherwise negative EGD   Family History  Problem Relation Age of Onset  . Coronary artery disease Father 88  . Coronary artery disease Mother 26  . Coronary artery disease Brother   . Colon cancer Neg Hx    History  Substance Use Topics  . Smoking status: Current Every Day Smoker -- 0.50 packs/day for 30 years    Types: Cigarettes  . Smokeless tobacco: Current User    Types: Snuff  . Alcohol Use: No   OB History   Grav Para Term Preterm Abortions TAB SAB Ect Mult Living                 Review of Systems  Constitutional: Negative for fever, chills and fatigue.  HENT: Positive for congestion, ear pain, hearing loss, rhinorrhea and sore throat. Negative for ear discharge, facial swelling, sinus pressure, trouble swallowing and voice change.   Eyes: Negative for discharge.  Respiratory: Negative for cough, shortness of breath, wheezing and stridor.   Cardiovascular: Negative for chest pain and leg swelling.  Gastrointestinal: Negative for abdominal pain.  Genitourinary: Negative.       Allergies  Nitroglycerin; Doxycycline; Flexeril; Ibuprofen; Tramadol; Tylenol; Vesicare; and Amoxil  Home Medications   Current Outpatient Rx  Name  Route  Sig  Dispense  Refill  . albuterol (PROVENTIL HFA;VENTOLIN HFA) 108 (90 BASE) MCG/ACT inhaler   Inhalation   Inhale 2 puffs into the lungs every 6 (six) hours as needed for wheezing or shortness of breath.          Marland Kitchen. atorvastatin (LIPITOR) 80 MG tablet   Oral   Take 1 tablet (80 mg total) by mouth at bedtime.   30 tablet   0   . budesonide-formoterol (SYMBICORT) 80-4.5 MCG/ACT inhaler   Inhalation   Inhale 2 puffs into the lungs 2 (two) times daily.         Marland Kitchen. esomeprazole (NEXIUM) 40 MG capsule   Oral   Take 40 mg by mouth daily at 12 noon.         . furosemide (LASIX) 40 MG tablet   Oral   Take 2 tablets (80 mg  total) by mouth 2 (two) times daily.   30 tablet   0   . metoprolol (LOPRESSOR) 50 MG tablet   Oral   Take 25 mg by mouth 3 (three) times daily.         . OxyCODONE (OXYCONTIN) 10 mg T12A 12 hr tablet   Oral   Take 10 mg by mouth every 12 (twelve) hours.         Marland Kitchen. QUEtiapine (SEROQUEL) 25 MG tablet   Oral   Take 50 mg by mouth 2 (two) times daily.         Marland Kitchen. tiotropium (SPIRIVA) 18 MCG inhalation capsule   Inhalation   Place 18 mcg into inhaler and inhale daily.         Marland Kitchen. warfarin (COUMADIN) 4 MG tablet   Oral   Take 4 mg by mouth at bedtime.          BP 141/88  Pulse 80  Temp(Src) 98.1 F (36.7 C) (Oral)  Resp 20  Ht 5\' 9"  (1.753 m)  Wt 240 lb (108.863 kg)  BMI 35.43 kg/m2  SpO2 99%  LMP 02/08/2007 Physical Exam  Constitutional: She is oriented to person, place, and time. She appears well-developed and well-nourished.  HENT:  Head: Normocephalic and atraumatic.  Right Ear: Ear canal normal. No drainage, swelling or tenderness. Tympanic membrane is scarred. Tympanic membrane is not injected, not erythematous, not retracted and not bulging. Decreased hearing is noted.  Left Ear: Tympanic membrane and ear canal normal.  Nose: Rhinorrhea present. No mucosal edema.  Mouth/Throat: Uvula is midline, oropharynx is clear and moist and mucous membranes are normal. No oropharyngeal exudate, posterior oropharyngeal edema, posterior oropharyngeal erythema or tonsillar abscesses.  Right tm fully opacified, suspect old scarring,  Unable to see normal landmarks.  Eyes: Conjunctivae and EOM are normal. Pupils are equal, round, and reactive to light. Right eye exhibits no nystagmus. Left eye exhibits no nystagmus.  Cardiovascular: Normal rate and normal heart sounds.   Pulmonary/Chest: Effort normal. No respiratory distress. She has no decreased breath sounds. She has no wheezes. She has no rhonchi. She has no rales.  Abdominal: Soft. There is no tenderness.  Musculoskeletal:  Normal range of motion. She exhibits no edema.  Neurological: She is alert and oriented to person, place, and time.  Skin: Skin is warm and dry. No rash noted.  Psychiatric: She has a normal mood and affect.    ED Course  Procedures (including critical care time) Labs Review Labs Reviewed - No data to display Imaging Review No results found.   EKG Interpretation  None      MDM   Final diagnoses:  Earache on right    Given history,  Suspect patient does have a middle ear effusion, although unable to visualize.  She was given auralgan drops for ear discomfort and obtained relief when instilled here.  She was referred to ENT for f/u evaluation and care.  Pt agreeable to plan.      Burgess Amor, PA-C 02/16/14 1445

## 2014-02-17 NOTE — ED Provider Notes (Signed)
Medical screening examination/treatment/procedure(s) were performed by non-physician practitioner and as supervising physician I was immediately available for consultation/collaboration.   EKG Interpretation None        Joya Gaskins, MD 02/17/14 [phone]

## 2014-03-10 ENCOUNTER — Encounter (HOSPITAL_COMMUNITY): Payer: Self-pay | Admitting: Emergency Medicine

## 2014-03-10 ENCOUNTER — Emergency Department (HOSPITAL_COMMUNITY): Payer: Medicaid Other

## 2014-03-10 ENCOUNTER — Emergency Department (HOSPITAL_COMMUNITY)
Admission: EM | Admit: 2014-03-10 | Discharge: 2014-03-10 | Disposition: A | Discharge status: Home / Self Care | Payer: Medicaid Other | Attending: Emergency Medicine | Admitting: Emergency Medicine

## 2014-03-10 DIAGNOSIS — Z950 Presence of cardiac pacemaker: Secondary | ICD-10-CM | POA: Insufficient documentation

## 2014-03-10 DIAGNOSIS — Z7901 Long term (current) use of anticoagulants: Secondary | ICD-10-CM | POA: Insufficient documentation

## 2014-03-10 DIAGNOSIS — Z9981 Dependence on supplemental oxygen: Secondary | ICD-10-CM | POA: Insufficient documentation

## 2014-03-10 DIAGNOSIS — K219 Gastro-esophageal reflux disease without esophagitis: Secondary | ICD-10-CM | POA: Insufficient documentation

## 2014-03-10 DIAGNOSIS — Z3202 Encounter for pregnancy test, result negative: Secondary | ICD-10-CM | POA: Insufficient documentation

## 2014-03-10 DIAGNOSIS — IMO0002 Reserved for concepts with insufficient information to code with codable children: Secondary | ICD-10-CM | POA: Insufficient documentation

## 2014-03-10 DIAGNOSIS — E119 Type 2 diabetes mellitus without complications: Secondary | ICD-10-CM | POA: Insufficient documentation

## 2014-03-10 DIAGNOSIS — Z91148 Patient's other noncompliance with medication regimen for other reason: Secondary | ICD-10-CM

## 2014-03-10 DIAGNOSIS — R0600 Dyspnea, unspecified: Secondary | ICD-10-CM

## 2014-03-10 DIAGNOSIS — J4489 Other specified chronic obstructive pulmonary disease: Secondary | ICD-10-CM

## 2014-03-10 DIAGNOSIS — J441 Chronic obstructive pulmonary disease with (acute) exacerbation: Secondary | ICD-10-CM | POA: Insufficient documentation

## 2014-03-10 DIAGNOSIS — Z720 Tobacco use: Secondary | ICD-10-CM

## 2014-03-10 DIAGNOSIS — J449 Chronic obstructive pulmonary disease, unspecified: Secondary | ICD-10-CM

## 2014-03-10 DIAGNOSIS — Z86711 Personal history of pulmonary embolism: Secondary | ICD-10-CM | POA: Insufficient documentation

## 2014-03-10 DIAGNOSIS — Z9114 Patient's other noncompliance with medication regimen: Secondary | ICD-10-CM

## 2014-03-10 DIAGNOSIS — Z8701 Personal history of pneumonia (recurrent): Secondary | ICD-10-CM | POA: Insufficient documentation

## 2014-03-10 DIAGNOSIS — I4891 Unspecified atrial fibrillation: Secondary | ICD-10-CM

## 2014-03-10 DIAGNOSIS — F17200 Nicotine dependence, unspecified, uncomplicated: Secondary | ICD-10-CM

## 2014-03-10 DIAGNOSIS — Z91199 Patient's noncompliance with other medical treatment and regimen due to unspecified reason: Secondary | ICD-10-CM | POA: Insufficient documentation

## 2014-03-10 DIAGNOSIS — G8929 Other chronic pain: Secondary | ICD-10-CM | POA: Insufficient documentation

## 2014-03-10 DIAGNOSIS — I5022 Chronic systolic (congestive) heart failure: Secondary | ICD-10-CM

## 2014-03-10 DIAGNOSIS — I252 Old myocardial infarction: Secondary | ICD-10-CM | POA: Insufficient documentation

## 2014-03-10 DIAGNOSIS — Z79899 Other long term (current) drug therapy: Secondary | ICD-10-CM | POA: Insufficient documentation

## 2014-03-10 DIAGNOSIS — Z880 Allergy status to penicillin: Secondary | ICD-10-CM | POA: Insufficient documentation

## 2014-03-10 DIAGNOSIS — F411 Generalized anxiety disorder: Secondary | ICD-10-CM | POA: Insufficient documentation

## 2014-03-10 DIAGNOSIS — Z9119 Patient's noncompliance with other medical treatment and regimen: Secondary | ICD-10-CM | POA: Insufficient documentation

## 2014-03-10 DIAGNOSIS — Z8619 Personal history of other infectious and parasitic diseases: Secondary | ICD-10-CM | POA: Insufficient documentation

## 2014-03-10 DIAGNOSIS — I509 Heart failure, unspecified: Secondary | ICD-10-CM

## 2014-03-10 DIAGNOSIS — Z9889 Other specified postprocedural states: Secondary | ICD-10-CM | POA: Insufficient documentation

## 2014-03-10 HISTORY — DX: Patient's other noncompliance with medication regimen: Z91.14

## 2014-03-10 HISTORY — DX: Patient's other noncompliance with medication regimen for other reason: Z91.148

## 2014-03-10 HISTORY — DX: Long term (current) use of anticoagulants: Z79.01

## 2014-03-10 LAB — URINALYSIS, ROUTINE W REFLEX MICROSCOPIC
Bilirubin Urine: NEGATIVE
Glucose, UA: NEGATIVE mg/dL
Hgb urine dipstick: NEGATIVE
KETONES UR: NEGATIVE mg/dL
Leukocytes, UA: NEGATIVE
NITRITE: NEGATIVE
PH: 6 (ref 5.0–8.0)
Protein, ur: NEGATIVE mg/dL
Specific Gravity, Urine: 1.01 (ref 1.005–1.030)
UROBILINOGEN UA: 0.2 mg/dL (ref 0.0–1.0)

## 2014-03-10 LAB — BASIC METABOLIC PANEL
BUN: 6 mg/dL (ref 6–23)
CHLORIDE: 101 meq/L (ref 96–112)
CO2: 28 meq/L (ref 19–32)
Calcium: 9 mg/dL (ref 8.4–10.5)
Creatinine, Ser: 0.63 mg/dL (ref 0.50–1.10)
GFR calc Af Amer: >90 mL/min (ref >90)
GFR calc non Af Amer: >90 mL/min (ref >90)
Glucose, Bld: 79 mg/dL (ref 70–99)
POTASSIUM: 3.7 meq/L (ref 3.7–5.3)
Sodium: 140 mEq/L (ref 137–147)

## 2014-03-10 LAB — CBC WITH DIFFERENTIAL/PLATELET
Basophils Absolute: 0 10*3/uL (ref 0.0–0.1)
Basophils Relative: 0 % (ref 0–1)
Eosinophils Absolute: 0.1 10*3/uL (ref 0.0–0.7)
Eosinophils Relative: 2 % (ref 0–5)
HCT: 41.2 % (ref 36.0–46.0)
Hemoglobin: 13.9 g/dL (ref 12.0–15.0)
LYMPHS ABS: 2.3 10*3/uL (ref 0.7–4.0)
LYMPHS PCT: 35 % (ref 12–46)
MCH: 34.2 pg — ABNORMAL HIGH (ref 26.0–34.0)
MCHC: 33.7 g/dL (ref 30.0–36.0)
MCV: 101.5 fL — AB (ref 78.0–100.0)
Monocytes Absolute: 0.7 10*3/uL (ref 0.1–1.0)
Monocytes Relative: 11 % (ref 3–12)
Neutro Abs: 3.5 10*3/uL (ref 1.7–7.7)
Neutrophils Relative %: 52 % (ref 43–77)
PLATELETS: 205 10*3/uL (ref 150–400)
RBC: 4.06 MIL/uL (ref 3.87–5.11)
RDW: 14.4 % (ref 11.5–15.5)
WBC: 6.7 10*3/uL (ref 4.0–10.5)

## 2014-03-10 LAB — TROPONIN I: TROPONIN I: 0.32 ng/mL — AB (ref <0.30)

## 2014-03-10 LAB — PROTIME-INR
INR: 1.1 (ref 0.00–1.49)
Prothrombin Time: 14 seconds (ref 11.6–15.2)

## 2014-03-10 LAB — PREGNANCY, URINE: Preg Test, Ur: NEGATIVE

## 2014-03-10 LAB — PRO B NATRIURETIC PEPTIDE: Pro B Natriuretic peptide (BNP): 1022 pg/mL — ABNORMAL HIGH (ref 0–125)

## 2014-03-10 MED ORDER — IOHEXOL 350 MG/ML SOLN
100.0000 mL | Freq: Once | INTRAVENOUS | Status: AC | PRN
  Administered 2014-03-10: 100 mL via INTRAVENOUS

## 2014-03-10 MED ORDER — OXYCODONE HCL 5 MG PO TABS
10.0000 mg | ORAL_TABLET | Freq: Once | ORAL | Status: AC
  Administered 2014-03-10: 10 mg via ORAL
  Filled 2014-03-10: qty 2

## 2014-03-10 MED ORDER — ALBUTEROL SULFATE (2.5 MG/3ML) 0.083% IN NEBU
2.5000 mg | INHALATION_SOLUTION | Freq: Once | RESPIRATORY_TRACT | Status: AC
  Administered 2014-03-10: 2.5 mg via RESPIRATORY_TRACT
  Filled 2014-03-10: qty 3

## 2014-03-10 MED ORDER — FUROSEMIDE 40 MG PO TABS: 80.0000 mg | ORAL_TABLET | Freq: Two times a day (BID) | ORAL | Status: DC

## 2014-03-10 MED ORDER — FUROSEMIDE 10 MG/ML IJ SOLN
40.0000 mg | Freq: Once | INTRAMUSCULAR | Status: AC
  Administered 2014-03-10: 40 mg via INTRAVENOUS
  Filled 2014-03-10: qty 4

## 2014-03-10 MED ORDER — ASPIRIN 81 MG PO CHEW
324.0000 mg | CHEWABLE_TABLET | Freq: Once | ORAL | Status: DC
  Filled 2014-03-10: qty 4

## 2014-03-10 MED ORDER — IPRATROPIUM-ALBUTEROL 0.5-2.5 (3) MG/3ML IN SOLN
3.0000 mL | Freq: Once | RESPIRATORY_TRACT | Status: AC
  Administered 2014-03-10: 3 mL via RESPIRATORY_TRACT
  Filled 2014-03-10: qty 3

## 2014-03-10 NOTE — Consult Note (Signed)
[name] Medical Consultation  Kim [name] ZOX:[id number] DOB: 1966-08-25 DOA: 03/10/2014 PCP: Dorian Heckle, MD   Requesting physician: Eden Emms, ER physician Date of consultation: 03/10/14 Reason for consultation: Evaluation for admission  Impression/Recommendations Shortness of breath: Looks to be more chronic. Patient may have some minimal volume overload, but she received a dose of Lasix in the emergency room. He is currently at her baseline oxygen rate. She is noncompliant in terms of tobacco use and actually she was advised at last hospitalization to be on 3 L nasal cannula, but has only been on 2 at home because she says it dries her nose out.   Atrial fibrillation: Patient continues to remain noncompliant with taking her Coumadin  Chronic diastolic heart failure: Mildly elevated BNP around the thousand. This is actually lower than her previous hospitalization. Patient reports noncompliance with Lasix. I have given her a dose of Lasix in the emergency room. Also given her prescription for Lasix 80 mg twice a day with multiple refills. Per week in the emergency room was 229 pounds, which is actually 11 pounds lighter than when she was discharged on at the end of February.  Tobacco abuse: Patient advised to quit. She continues to actively smoke  Chronic respiratory failure: Looks to be chronic but no acute component. Mostly in part due to COPD plus some chronic diastolic heart failure  Personality disorder: Patient is on Seroquel chronically. She is very noncompliant.  Elevated troponin level: Minimally elevated at 0.32. Patient is a previous workup is felt to have troponin leak from her nonobstructive CAD, COPD and CHF. Repeat troponin was normal. She's not felt to have acute coronary syndrome.  Chief Complaint: Shortness of breath   HPI:  Patient is a 49 year old white female with frequent ER visits and occasional hospitalizations in which she has chronic tobacco  use, COPD, diastolic heart failure and atrial fibrillation worsened by noncompliance with Coumadin, Lasix and her oxygen. Patient presented to the emergency room today for/4 initially complaining of fluid behind her ear which was felt to show no signs of infection and the patient is going to be sent home when she started complaining of right-sided chest pain and shortness of breath. EKG was paced rhythm and chronic. CT angiogram noted no acute evidence of pulmonary embolus. Chest x-ray noted no signs of acute effusion. BNP was elevated at 1022, which was lower from previous ER visit last month. Patient is on her normal rate of home oxygen. Her weight was checked and found to be at 229, 11 pounds t her previous discharge. Patient is given a dose of Lasix and a breathing treatment. She tried to request narcotics and benzodiazepines for discharge. She was only given a prescription for Lasix and she'll followup with her PCP.   Review of Systems:  Patient complains of shortness of breath. Denies any headaches, vision changes, dysphagia. She was complaining of right-sided chest pain earlier, which has since resolved. She does complain of palpitations. Denies any cough, hemoptysis, hematuria, dysuria, abdominal pain, wheezing, constipation or diarrhea. No focal extremity numbness or weakness or pain. Review systems is otherwise negative   Past Medical History  Diagnosis Date  . Pacemaker     vent paced  . COPD (chronic obstructive pulmonary disease)   . CHF (congestive heart failure)   . Thyroid disease   . Hepatitis     Hep C  . MI (myocardial infarction)   . Pneumonia   . Dysrhythmia     atrial fibrilation  .  Cirrhosis   . Shortness of breath   . Diabetes mellitus   . Chest pain   . GERD (gastroesophageal reflux disease)   . Headache(784.0)   . Anxiety   . Permanent atrial fibrillation   . Chronic pain   . PE (pulmonary embolism)   . On home O2     3L N/C  . Chronic anticoagulation   .  History of medication noncompliance    Past Surgical History  Procedure Laterality Date  . Cholecystectomy    . Pacemaker insertion  02/2012    Biotronik PPM   . Cardiac electrophysiology study and ablation    . Esophagogastroduodenoscopy  Jan 2010    Dr. Arlyce Dice: multiple gastric erosions, benign path  . Esophagogastroduodenoscopy N/A 01/10/2014    [id number] EG junction. Query short segment Barrett's esophagus-status post pinch biopsy as described above. Hiatal hernia; otherwise negative EGD  . Cardiac catheterization  07/2012    mild non-obstructive CAD   Social History:  reports that she has been smoking Cigarettes.  She has a 15 pack-year smoking history. Her smokeless tobacco use includes Snuff. She reports that she does not drink alcohol or use illicit drugs.  Allergies  Allergen Reactions  . Nitroglycerin Other (See Comments)    CAUSED NUMBNESS ALL OVER  . Doxycycline Itching  . Flexeril [Cyclobenzaprine] Other (See Comments)    Sweating, Lightheaded   . Ibuprofen Other (See Comments)    Increase BP, dizziness, lightheaded  . Tramadol Itching  . Tylenol [Acetaminophen] Other (See Comments)    Pt has Hep C  . Vesicare [Solifenacin] Other (See Comments)    Makes my sweat turn yellow  . Amoxil [Amoxicillin] Nausea Only   Family History  Problem Relation Age of Onset  . Coronary artery disease Father 54  . Coronary artery disease Mother 13  . Coronary artery disease Brother   . Colon cancer Neg Hx     Prior to Admission medications   Medication Sig Start Date End Date Taking? Authorizing Provider  atorvastatin (LIPITOR) 80 MG tablet Take 1 tablet (80 mg total) by mouth at bedtime. 01/11/14  Yes Elease Etienne, MD  budesonide-formoterol (SYMBICORT) 80-4.5 MCG/ACT inhaler Inhale 2 puffs into the lungs 2 (two) times daily.   Yes Historical Provider, MD  esomeprazole (NEXIUM) 40 MG capsule Take 40 mg by mouth daily at 12 noon.   Yes Historical Provider, MD  metoprolol  (LOPRESSOR) 50 MG tablet Take 25 mg by mouth 3 (three) times daily.   Yes Historical Provider, MD  OxyCODONE (OXYCONTIN) 10 mg T12A 12 hr tablet Take 10 mg by mouth every 12 (twelve) hours.   Yes Historical Provider, MD  QUEtiapine (SEROQUEL) 25 MG tablet Take 50 mg by mouth 2 (two) times daily. 12/08/13  Yes Historical Provider, MD  tiotropium (SPIRIVA) 18 MCG inhalation capsule Place 18 mcg into inhaler and inhale daily.   Yes Historical Provider, MD  albuterol (PROVENTIL HFA;VENTOLIN HFA) 108 (90 BASE) MCG/ACT inhaler Inhale 2 puffs into the lungs every 6 (six) hours as needed for wheezing or shortness of breath.     Historical Provider, MD  furosemide (LASIX) 40 MG tablet Take 2 tablets (80 mg total) by mouth 2 (two) times daily. 03/10/14   Hollice Espy, MD  warfarin (COUMADIN) 4 MG tablet Take 4 mg by mouth at bedtime.    Historical Provider, MD   Physical Exam: Blood pressure 153/82, pulse 74, temperature 97.6 F (36.4 C), temperature source Oral, resp. rate 20, weight 104.282  kg (229 lb 14.4 oz), last menstrual period 02/08/2007, SpO2 97.00%. Filed Vitals:   03/10/14 2000  BP:   Pulse: 74  Temp:   Resp: 20     General:  Alert x3, no acute distress   Eyes: Sclera nonicteric   ENT: Normocephalic, atraumatic, mucous membranes are moist   Neck: No JVD   Cardiovascular: Regular rhythm, occasional ectopic beat   Respiratory: Decreased breath sounds throughout   Abdomen: Soft, obese, nontender, positive bowel sounds   Skin: No skin breaks, tears or lesions   Musculoskeletal: No clubbing, cyanosis or edema   Psychiatric: Patient is appropriate, no evidence of psychoses   Neurologic: No overt deficits   Labs on Admission:  Basic Metabolic Panel:  Recent Labs Lab 03/10/14 1758  NA 140  K 3.7  CL 101  CO2 28  GLUCOSE 79  BUN 6  CREATININE 0.63  CALCIUM 9.0   Liver Function Tests: No results found for this basename: AST, ALT, ALKPHOS, BILITOT, PROT, ALBUMIN,  in  the last 168 hours No results found for this basename: LIPASE, AMYLASE,  in the last 168 hours No results found for this basename: AMMONIA,  in the last 168 hours CBC:  Recent Labs Lab 03/10/14 1758  WBC 6.7  NEUTROABS 3.5  HGB 13.9  HCT 41.2  MCV 101.5*  PLT 205   Cardiac Enzymes:  Recent Labs Lab 03/10/14 1758 03/10/14 2031  TROPONINI 0.32* <0.30   BNP: No components found with this basename: POCBNP,  CBG: No results found for this basename: GLUCAP,  in the last 168 hours  Radiological Exams on Admission: Ct Angio Chest Pe W/cm &/or Wo Cm  03/10/2014   CLINICAL DATA:  Chest pain and shortness of breath  EXAM: CT ANGIOGRAPHY CHEST WITH CONTRAST  TECHNIQUE: Multidetector CT imaging of the chest was performed using the standard protocol during bolus administration of intravenous contrast. Multiplanar CT image reconstructions and MIPs were obtained to evaluate the vascular anatomy.  CONTRAST:  OMNIPAQUE IOHEXOL 350 MG/ML SOLN  COMPARISON:  Chest radiograph March 10, 2014  FINDINGS: There is no demonstrable pulmonary embolus. There is no thoracic aortic aneurysm or dissection.  There is mild atelectatic change in both lower lobes. There are several 2-3 mm nodular opacities in the posterior segment of the right upper lobe. On axial slice 70 series 5, there is a 3 mm nodular opacity in the superior segment of the left lower lobe. On axial slice 5, there is a 4 mm nodular opacity in the posterior segment of the right lower lobe.  There is a small hiatal hernia. There are multiple small mediastinal lymph nodes but no frank adenopathy by size criteria. Pericardium is not thickened. There are foci of coronary artery calcification. Pacemaker leads are attached to the right atrium and right ventricle.  Visualized upper abdominal structures appear unremarkable. There are no blastic or lytic bone lesions. Thyroid appears normal. There is left maxillary sinus disease.  Review of the MIP images  confirms the above findings.  IMPRESSION: No demonstrable pulmonary embolus.  No edema or consolidation. There are several nodular opacities in the lungs as noted above. Followup of the small nodular lesion should be based on Fleischner Society guidelines. If the patient is at high risk for bronchogenic carcinoma, follow-up chest CT at 1 year is recommended. If the patient is at low risk, no follow-up is needed. This recommendation follows the consensus statement: Guidelines for Management of Small Pulmonary Nodules Detected on CT Scans: A Statement  from the Fleischner Society as published in Radiology 2005; 237:395-400.  Small hiatal hernia. Left maxillary sinus disease. Coronary artery calcification noted.   Electronically Signed   By: Bretta BangWilliam  Woodruff M.D.   On: 03/10/2014 19:42   Dg Chest Portable 1 View  03/10/2014   CLINICAL DATA:  SHORTNESS OF BREATH CHEST PAIN  EXAM: PORTABLE CHEST - 1 VIEW  COMPARISON:  DG CHEST 1V PORT dated 02/03/2014  FINDINGS: Stable cardiomegaly. Left chest wall dual-chamber cardiac pacer, lead tips right atrium and right ventricle. No focal region of consolidation or focal infiltrates. There is stable mild prominence of the interstitial markings, without evidence of peribronchial cuffing. The osseous structures are unremarkable.  IMPRESSION: Findings again consistent with pulmonary vascular congestion without overt edema. Underlying component of chronic bronchitic change also a diagnostic consideration. No focal regions of consolidation or focal infiltrates.   Electronically Signed   By: Salome HolmesHector  Cooper M.D.   On: 03/10/2014 19:42    EKG: Independently reviewed. Paced rhythm, nothing acute   Time spent: 40 minutes   Hollice EspyKRISHNAN,Brenn Gatton K [name] Pager [phone]   If 7PM-7AM, please contact night-coverage [email or web address] Password [email or web address] 03/10/2014, 10:17 PM

## 2014-03-10 NOTE — ED Notes (Signed)
CRITICAL VALUE ALERT  Critical value received:  Troponin  Date of notification:  03/09/14  Time of notification:  1846  Critical value read back:yes  Nurse who received alert: Lake Bells, RN  MD notified (1st page):  Dr Clarene Duke  Time of first page:  [phone]

## 2014-03-10 NOTE — ED Provider Notes (Signed)
CSN: [id number]     Arrival date & time 03/10/14  1703 History   First MD Initiated Contact with Patient 03/10/14 1722     Chief Complaint  Patient presents with  . Shortness of Breath  . Chest Pain      HPI Pt was seen at 1750. Per pt, c/o gradual onset and worsening of persistent SOB and CP for the past 2 days. States she has been taking her usual MDI and wearing her home O2 N/C without improvement in her symptoms. Denies palpitations, no cough, no abd pain, no N/V/D, no back pain, no fevers, no rash. The symptoms have been associated with no other complaints. The patient has a significant history of similar symptoms previously, recently being evaluated for this complaint and multiple prior evals for same.      Past Medical History  Diagnosis Date  . Pacemaker     vent paced  . COPD (chronic obstructive pulmonary disease)   . CHF (congestive heart failure)   . Thyroid disease   . Hepatitis     Hep C  . MI (myocardial infarction)   . Pneumonia   . Dysrhythmia     atrial fibrilation  . Cirrhosis   . Shortness of breath   . Diabetes mellitus   . Chest pain   . GERD (gastroesophageal reflux disease)   . Headache(784.0)   . Anxiety   . Permanent atrial fibrillation   . Chronic pain   . PE (pulmonary embolism)   . On home O2     3L N/C  . Chronic anticoagulation   . History of medication noncompliance    Past Surgical History  Procedure Laterality Date  . Cholecystectomy    . Pacemaker insertion  02/2012    Biotronik PPM   . Cardiac electrophysiology study and ablation    . Esophagogastroduodenoscopy  Jan 2010    Dr. Arlyce Dice: multiple gastric erosions, benign path  . Esophagogastroduodenoscopy N/A 01/10/2014    [id number] EG junction. Query short segment Barrett's esophagus-status post pinch biopsy as described above. Hiatal hernia; otherwise negative EGD  . Cardiac catheterization  07/2012    mild non-obstructive CAD   Family History  Problem Relation Age of Onset   . Coronary artery disease Father 33  . Coronary artery disease Mother 58  . Coronary artery disease Brother   . Colon cancer Neg Hx    History  Substance Use Topics  . Smoking status: Current Every Day Smoker -- 0.50 packs/day for 30 years    Types: Cigarettes  . Smokeless tobacco: Current User    Types: Snuff  . Alcohol Use: No    Review of Systems ROS: Statement: All systems negative except as marked or noted in the HPI; Constitutional: Negative for fever and chills. ; ; Eyes: Negative for eye pain, redness and discharge. ; ; ENMT: Negative for ear pain, hoarseness, nasal congestion, sinus pressure and sore throat. ; ; Cardiovascular: +CP, SOB. Negative for palpitations, diaphoresis, and peripheral edema. ; ; Respiratory: Negative for cough, wheezing and stridor. ; ; Gastrointestinal: Negative for nausea, vomiting, diarrhea, abdominal pain, blood in stool, hematemesis, jaundice and rectal bleeding. . ; ; Genitourinary: Negative for dysuria, flank pain and hematuria. ; ; Musculoskeletal: Negative for back pain and neck pain. Negative for swelling and trauma.; ; Skin: Negative for pruritus, rash, abrasions, blisters, bruising and skin lesion.; ; Neuro: Negative for headache, lightheadedness and neck stiffness. Negative for weakness, altered level of consciousness , altered mental status, extremity weakness,  paresthesias, involuntary movement, seizure and syncope.        Allergies  Nitroglycerin; Doxycycline; Flexeril; Ibuprofen; Tramadol; Tylenol; Vesicare; and Amoxil  Home Medications   Current Outpatient Rx  Name  Route  Sig  Dispense  Refill  . atorvastatin (LIPITOR) 80 MG tablet   Oral   Take 1 tablet (80 mg total) by mouth at bedtime.   30 tablet   0   . budesonide-formoterol (SYMBICORT) 80-4.5 MCG/ACT inhaler   Inhalation   Inhale 2 puffs into the lungs 2 (two) times daily.         Marland Kitchen esomeprazole (NEXIUM) 40 MG capsule   Oral   Take 40 mg by mouth daily at 12 noon.          . metoprolol (LOPRESSOR) 50 MG tablet   Oral   Take 25 mg by mouth 3 (three) times daily.         . OxyCODONE (OXYCONTIN) 10 mg T12A 12 hr tablet   Oral   Take 10 mg by mouth every 12 (twelve) hours.         Marland Kitchen QUEtiapine (SEROQUEL) 25 MG tablet   Oral   Take 50 mg by mouth 2 (two) times daily.         Marland Kitchen tiotropium (SPIRIVA) 18 MCG inhalation capsule   Inhalation   Place 18 mcg into inhaler and inhale daily.         Marland Kitchen albuterol (PROVENTIL HFA;VENTOLIN HFA) 108 (90 BASE) MCG/ACT inhaler   Inhalation   Inhale 2 puffs into the lungs every 6 (six) hours as needed for wheezing or shortness of breath.          . furosemide (LASIX) 40 MG tablet   Oral   Take 2 tablets (80 mg total) by mouth 2 (two) times daily.   30 tablet   0   . warfarin (COUMADIN) 4 MG tablet   Oral   Take 4 mg by mouth at bedtime.          BP 153/82  Pulse 73  Temp(Src) 97.6 F (36.4 C) (Oral)  Resp 20  SpO2 97%  LMP 02/08/2007 Physical Exam 1755: Physical examination:  Nursing notes reviewed; Vital signs and O2 SAT reviewed;  Constitutional: Well developed, Well nourished, Well hydrated, In no acute distress; Head:  Normocephalic, atraumatic; Eyes: EOMI, PERRL, No scleral icterus; ENMT: Mouth and pharynx normal, Mucous membranes moist; Neck: Supple, Full range of motion, No lymphadenopathy; Cardiovascular: Regular rate and rhythm, No gallop; Respiratory: Breath sounds coarse & equal bilaterally, No wheezes.  Speaking full sentences with ease, Normal respiratory effort/excursion; Chest: Nontender, Movement normal; Abdomen: Soft, Nontender, Nondistended, Normal bowel sounds; Genitourinary: No CVA tenderness; Extremities: Pulses normal, No tenderness, No edema, No calf edema or asymmetry.; Neuro: AA&Ox3, Major CN grossly intact.  Speech clear. No gross focal motor or sensory deficits in extremities.; Skin: Color normal, Warm, Dry.    ED Course  Procedures     EKG  Interpretation   Date/Time:  Saturday March 10 2014 17:28:18 EDT Ventricular Rate:  74 PR Interval:    QRS Duration: 200 QT Interval:  514 QTC Calculation: 570 R Axis:   -86 Text Interpretation:  Ventricular-paced rhythm Abnormal ECG When compared  with ECG of 03-Feb-2014 01:25, No significant change was found Confirmed  by [facility]  MD, Nicholos Johns [phone]) on 03/10/2014 5:57:01 PM      MDM  MDM Reviewed: previous chart, nursing note and vitals Reviewed previous: labs and ECG Interpretation: labs, ECG, x-ray  and CT scan    Results for orders placed during the hospital encounter of 03/10/14  BASIC METABOLIC PANEL      Result Value Ref Range   Sodium 140  137 - 147 mEq/L   Potassium 3.7  3.7 - 5.3 mEq/L   Chloride 101  96 - 112 mEq/L   CO2 28  19 - 32 mEq/L   Glucose, Bld 79  70 - 99 mg/dL   BUN 6  6 - 23 mg/dL   Creatinine, Ser 8.110.63  0.50 - 1.10 mg/dL   Calcium 9.0  8.4 - 91.410.5 mg/dL   GFR calc non Af Amer >90  >90 mL/min   GFR calc Af Amer >90  >90 mL/min  CBC WITH DIFFERENTIAL      Result Value Ref Range   WBC 6.7  4.0 - 10.5 K/uL   RBC 4.06  3.87 - 5.11 MIL/uL   Hemoglobin 13.9  12.0 - 15.0 g/dL   HCT 78.241.2  95.636.0 - 21.346.0 %   MCV 101.5 (*) 78.0 - 100.0 fL   MCH 34.2 (*) 26.0 - 34.0 pg   MCHC 33.7  30.0 - 36.0 g/dL   RDW 08.614.4  57.811.5 - 46.915.5 %   Platelets 205  150 - 400 K/uL   Neutrophils Relative % 52  43 - 77 %   Neutro Abs 3.5  1.7 - 7.7 K/uL   Lymphocytes Relative 35  12 - 46 %   Lymphs Abs 2.3  0.7 - 4.0 K/uL   Monocytes Relative 11  3 - 12 %   Monocytes Absolute 0.7  0.1 - 1.0 K/uL   Eosinophils Relative 2  0 - 5 %   Eosinophils Absolute 0.1  0.0 - 0.7 K/uL   Basophils Relative 0  0 - 1 %   Basophils Absolute 0.0  0.0 - 0.1 K/uL  TROPONIN I      Result Value Ref Range   Troponin I 0.32 (*) <0.30 ng/mL  PRO B NATRIURETIC PEPTIDE      Result Value Ref Range   Pro B Natriuretic peptide (BNP) 1022.0 (*) 0 - 125 pg/mL  PROTIME-INR      Result Value Ref Range    Prothrombin Time 14.0  11.6 - 15.2 seconds   INR 1.10  0.00 - 1.49   Ct Angio Chest Pe W/cm &/or Wo Cm 03/10/2014   CLINICAL DATA:  Chest pain and shortness of breath  EXAM: CT ANGIOGRAPHY CHEST WITH CONTRAST  TECHNIQUE: Multidetector CT imaging of the chest was performed using the standard protocol during bolus administration of intravenous contrast. Multiplanar CT image reconstructions and MIPs were obtained to evaluate the vascular anatomy.  CONTRAST:  100mL OMNIPAQUE IOHEXOL 350 MG/ML SOLN  COMPARISON:  Chest radiograph March 10, 2014  FINDINGS: There is no demonstrable pulmonary embolus. There is no thoracic aortic aneurysm or dissection.  There is mild atelectatic change in both lower lobes. There are several 2-3 mm nodular opacities in the posterior segment of the right upper lobe. On axial slice 70 series 5, there is a 3 mm nodular opacity in the superior segment of the left lower lobe. On axial slice 5, there is a 4 mm nodular opacity in the posterior segment of the right lower lobe.  There is a small hiatal hernia. There are multiple small mediastinal lymph nodes but no frank adenopathy by size criteria. Pericardium is not thickened. There are foci of coronary artery calcification. Pacemaker leads are attached to the right atrium and  right ventricle.  Visualized upper abdominal structures appear unremarkable. There are no blastic or lytic bone lesions. Thyroid appears normal. There is left maxillary sinus disease.  Review of the MIP images confirms the above findings.  IMPRESSION: No demonstrable pulmonary embolus.  No edema or consolidation. There are several nodular opacities in the lungs as noted above. Followup of the small nodular lesion should be based on Fleischner Society guidelines. If the patient is at high risk for bronchogenic carcinoma, follow-up chest CT at 1 year is recommended. If the patient is at low risk, no follow-up is needed. This recommendation follows the consensus statement:  Guidelines for Management of Small Pulmonary Nodules Detected on CT Scans: A Statement from the Fleischner Society as published in Radiology 2005; 237:395-400.  Small hiatal hernia. Left maxillary sinus disease. Coronary artery calcification noted.   Electronically Signed   By: Bretta Bang M.D.   On: 03/10/2014 19:42   Dg Chest Portable 1 View 03/10/2014   CLINICAL DATA:  SHORTNESS OF BREATH CHEST PAIN  EXAM: PORTABLE CHEST - 1 VIEW  COMPARISON:  DG CHEST 1V PORT dated 02/03/2014  FINDINGS: Stable cardiomegaly. Left chest wall dual-chamber cardiac pacer, lead tips right atrium and right ventricle. No focal region of consolidation or focal infiltrates. There is stable mild prominence of the interstitial markings, without evidence of peribronchial cuffing. The osseous structures are unremarkable.  IMPRESSION: Findings again consistent with pulmonary vascular congestion without overt edema. Underlying component of chronic bronchitic change also a diagnostic consideration. No focal regions of consolidation or focal infiltrates.   Electronically Signed   By: Salome Holmes M.D.   On: 03/10/2014 19:42    2015:  Pt given short neb on arrival to ED. CXR with pulm vasc congestion and BNP mildly elevated; IV lasix given. INR subtherapeutic per hx non-compliance and CT-A chest is negative for PE. Troponin mildly elevated but pt refuses to take ASA or ntg. States she "just wants some oxycodone." PO oxycodone given. Pt's VS remain stable, she has ambulated with steady gait, Sats 97% on her usual O2 N/C. T/C to [facility] Dr. Rito Ehrlich, case discussed, including:  HPI, pertinent PM/SHx, VS/PE, dx testing, ED course and treatment:  Agreeable to come to ED for evaluation, requests to have ED staff weight pt today.   2120:  [facility] MD has evaluated pt in the ED: pt's 2nd troponin normal, pt's weight is 4kg lower than her hospital discharge weight on 01/11/2014; he will be discharging pt with strong encouragement to take her usual  prescriptions as previously directed and to f/u with her PMD for good continuity of care and control of her chronic medical conditions.     Laray Anger, DO 03/12/14 1351

## 2014-03-10 NOTE — ED Notes (Signed)
Pt c/o SOB and left sided chest pain since yesterday. Pt reports hx of CHF and COPD. Pt denies relief with inhaler.

## 2014-03-10 NOTE — Discharge Instructions (Signed)
°Emergency Department Resource Guide °1) Find a Doctor and Pay Out of Pocket °Although you won't have to find out who is covered by your insurance plan, it is a good idea to ask around and get recommendations. You will then need to call the office and see if the doctor you have chosen will accept you as a new patient and what types of options they offer for patients who are self-pay. Some doctors offer discounts or will set up payment plans for their patients who do not have insurance, but you will need to ask so you aren't surprised when you get to your appointment. ° °2) Contact Your Local Health Department °Not all health departments have doctors that can see patients for sick visits, but many do, so it is worth a call to see if yours does. If you don't know where your local health department is, you can check in your phone book. The CDC also has a tool to help you locate your state's health department, and many state websites also have listings of all of their local health departments. ° °3) Find a Walk-in Clinic °If your illness is not likely to be very severe or complicated, you may want to try a walk in clinic. These are popping up all over the country in pharmacies, drugstores, and shopping centers. They're usually staffed by nurse practitioners or physician assistants that have been trained to treat common illnesses and complaints. They're usually fairly quick and inexpensive. However, if you have serious medical issues or chronic medical problems, these are probably not your best option. ° °No Primary Care Doctor: °- Call [facility] at  832-8000 - they can help you locate a primary care doctor that  accepts your insurance, provides certain services, etc. °- Physician Referral Service- 1-800-533-3463 ° °Chronic Pain Problems: °Organization         Address  Phone   Notes  °[facility]  (336) [phone] Patients need to be referred by their primary care doctor.  ° °Medication  Assistance: °Organization         Address  Phone   Notes  °[place] [facility] [address] °[place], [place] [address] (336) 641-8030 --Must be a resident of [place] °-- Must have NO insurance coverage whatsoever (no Medicaid/ Medicare, etc.) °-- The pt. MUST have a primary care doctor that directs their care regularly and follows them in the community °  °[facility]  (866) 331-1348   °[employer]  (888) 892-1162   ° °Agencies that provide inexpensive medical care: °Organization         Address  Phone   Notes  °[facility]  (336) 832-8035   °[facility]    (336) 832-7272   °[facility] [address] °[place], [place] [address] (336) 832-4777   °[facility] [address], °[place] (336) 271-4999   °[facility]    (336) 373-0678   °[facility]    (336) 272-1050   °[facility] ° [address], [place] Phone:  (336) 832-4444, Fax:  (336) 832-4440 Hours of Operation:  9 am - 6 pm, M-F.  Also accepts Medicaid/Medicare and self-pay.  °[facility] ° [address], [place] Phone: (336) [phone], Fax: (336) [phone]. Hours of Operation:  8:30 am - 5:30 pm, M-F.  Also accepts Medicaid and self-pay.  °[facility] [place] [address], [place] Phone: (336) 878-6027   °[facility] [address], [place], [place] (336)723-1848, Ext. 123 Mondays & Thursdays: 7-9 AM.  First 15 patients are seen on a first come, first serve basis. °  ° °Medicaid-accepting [place] Providers: ° °Organization         Address  Phone   Notes  °[facility] [address], [place] (336) 641-2100 Also accepts self-pay patients.  °[facility] [address], [place] ° (336) 856-9996   °[facility] [address], [place]  (336) 288-8857   °[facility] [address] [place] [address], [place] (336) 299-7000   °Veita Bland [address], [place]  ° (336) 373-1557 Only accepts [place] Access Medicaid patients after they have their name applied to their card.  ° °Self-Pay (no insurance) in [place]: ° °Organization         Address  Phone   Notes  °Sickle Cell Patients, [facility] [address], [place] (336) 832-1970   °[facility] [address], [place] (336) 832-4400   °[facility] ° [address], [place] (336) 992-4800   °[facility]/Dr. Osei-Bonsu ° [address] [place] [address], [place] or [address], [place] (336) [phone] Phone number for both [place] and [place] locations is the same.  °[facility] [address], [place] (336) 299-0000   °[facility] [address] [place] [address], [place] or [address] (336) 852-7530 °(336) 878-2260   °[facility] [address], [place] (336) 350-1642, phone; (336) 294-5005, fax Sees patients 1st and 3rd Saturday of every month.  Must not qualify for public or private insurance (i.e. Medicaid, Medicare, [place] Health Choice, Veterans' Benefits) • Household income should be no more than 200% of the poverty level •The clinic cannot treat you if you are pregnant or think you are pregnant • Sexually transmitted diseases are not treated at the clinic.  ° ° °Dental Care: °Organization         Address  Phone  Notes  °[place] [facility] [address], [place] (336) 641-6152 Accepts children up to age 21 who are enrolled in Medicaid or [place] Health Choice; pregnant women with a Medicaid card; and children who have applied for Medicaid or [place] Health Choice, but were declined, whose parents can pay a reduced fee at time of service.  °[place]  [facility] [place]  [address], [place] (336) 641-7733 Accepts children up to age 21 who are enrolled in Medicaid or [place] Health Choice; pregnant women with a Medicaid card; and children who have applied for Medicaid or [place] Health Choice, but were declined, whose parents can pay a reduced fee at time of service.  °[facility] ° [address], [place] (336) 641-4533 Patients are seen by appointment only. Walk-ins are not accepted. [facility] will see patients 18 years of age and older. °Monday - Tuesday (8am-5pm) °Most Wednesdays (8:30-5pm) °$30 per visit, cash only  °[facility] ° [address], [place] (336) 641-4533 Patients are seen by appointment only. Walk-ins are not accepted. [facility] will see patients 18 years of age and older. °One   Wednesday Evening (Monthly: Volunteer Based).  $30 per visit, cash only  °[facility]  (919) 537-3737 for adults; Children under age 4, call Graduate Pediatric Dentistry at (919) [phone]. Children aged 4-14, please call (919) [phone] to request a pediatric application. ° Dental services are provided in all areas of dental care including fillings, crowns and bridges, complete and partial dentures, implants, gum treatment, root canals, and extractions. Preventive care is also provided. Treatment is provided to both adults and children. °Patients are selected via a lottery and there is often a waiting list. °  °[facility] [address], °[place] ° (336) 763-8833 [email or web address].[email or web address] °  °[facility] [address], [place], [place] (336)723-1848, Ext. 123 Second and Fourth Thursday of each month, opens at 6:30 AM; Clinic ends at 9 AM.  Patients are seen on a first-come first-served basis, and a limited number are seen during each clinic.  ° °[facility] ° [address], [place], [place] (336) [phone]    Eligibility Requirements °You must have lived in [place], [place], or [place] counties for at least the last three months. °  You cannot be eligible for state or federal sponsored healthcare insurance, including Veterans Administration, Medicaid, or Medicare. °  You generally cannot be eligible for healthcare insurance through your employer.  °  How to apply: °Eligibility screenings are held every Tuesday and Wednesday afternoon from 1:00 pm until 4:00 pm. You do not need an appointment for the interview!  °[facility] [address], [place], [place] 336-631-2330   °[facility]  336-342-8273   °[facility]  336-703-3100   °[facility]  336-570-6415   ° °Behavioral Health Resources in the Community: °Intensive Outpatient Programs °Organization         Address  Phone  Notes  °[facility] [address], [place], [place] 336-878-6098   °[facility] [address], [place], [place] 336-832-9800   °[facility] [address], [place], [place] ° 336-882-2125   °[facility] [address],  °[place], [place] 1-800-853-5163 or 336-641-4981   °Substance Abuse Resources °Organization         Address  Phone  Notes  °[facility]  336-882-2125   °[facility]  336-784-9470   °[facility]  336-285-9073   °[facility]  336-845-3988   °Residential & Outpatient Substance Abuse Program  1-800-659-3381   °Psychological Services °Organization         Address  Phone  Notes  °[facility]  336- 832-9600   °[facility]  336- 378-7881   °[facility] [address], [place] 1-800-853-5163 or 336-641-4981   ° °Mobile Crisis Teams °Organization         Address  Phone  Notes  °[facility], Mobile Crisis Care Unit  1-877-626-1772   °Assertive °[facility] ° [address]  [place], [place] 336-834-9664   °Sharon DeEsch [address] °[place] 336-554-5454   ° °Self-Help/Support Groups °Organization         Address  Phone             Notes  °[facility]  - variety of support groups  336- 373-1402 Call for more information  °Narcotics Anonymous (NA), [facility] [address], °[place]  2 meetings at this location  ° °  Residential Treatment Programs Organization         Address  Phone  Notes  [facility] [address],    [place]  8-250-037-0488   [facility]  [address] [place] [address], [place], [place] 503-888-2800   [facility] [address] [place], [place] [phone] Admissions: 8am-3pm M-F  [facility] [address]    [place], [place] [phone]   [facility] [address] [place], [place], [place] [phone]   [facility] [address],  [place], [place] 675-449-2010   [facility] Intensive Outpatient [address], [place], [place] [phone]   [facility] ([facility] [address]  [place], [place] 3-254-982-6415 or 8726797688   [facility] ([facility] [address], [place], [place] [phone] Accepts Medicaid  [facility] [address],  [place] 5-859-292-4462 Substance Abuse/Addiction Treatment   [facility] Organization         Address  Phone  Notes  [facility]  (564) 162-9380   Angie Fava, PhD [address] [place], [place]   (862) 403-9165 or 936-291-1324   [facility]   [address] [place], [place] 408-617-5162   [facility] [address], [place], [place] (440) [phone] Insurance/Medicaid/sponsorship through [facility] [address]                                    [place], [place] [phone] Therapy/tele-psych/case    [facility] [address] [place], [place] [phone]    Dr. Lolly Mustache  ([phone]   [facility]  [employer] [facility] Dept. 1) [address], [place] 2) [address], [place] 3)  [address], [place] [phone]  [phone]   [facility] Child Abuse Hotline 612-311-5678 or (640)883-1968 (After Hours)      Take your prescriptions as directed.  Call your regular medical doctor and your Cardiologist on Monday to schedule a follow up appointment within the next 2 days.  Return to the Emergency Department immediately sooner if worsening.

## 2014-05-13 ENCOUNTER — Emergency Department (HOSPITAL_COMMUNITY)
Admission: EM | Admit: 2014-05-13 | Discharge: 2014-05-13 | Disposition: A | Discharge status: Home / Self Care | Payer: Medicaid Other | Attending: Emergency Medicine | Admitting: Emergency Medicine

## 2014-05-13 ENCOUNTER — Encounter (HOSPITAL_COMMUNITY): Payer: Self-pay | Admitting: Emergency Medicine

## 2014-05-13 DIAGNOSIS — J441 Chronic obstructive pulmonary disease with (acute) exacerbation: Secondary | ICD-10-CM | POA: Insufficient documentation

## 2014-05-13 DIAGNOSIS — Z9889 Other specified postprocedural states: Secondary | ICD-10-CM | POA: Insufficient documentation

## 2014-05-13 DIAGNOSIS — R109 Unspecified abdominal pain: Secondary | ICD-10-CM

## 2014-05-13 DIAGNOSIS — R609 Edema, unspecified: Secondary | ICD-10-CM | POA: Insufficient documentation

## 2014-05-13 DIAGNOSIS — F411 Generalized anxiety disorder: Secondary | ICD-10-CM | POA: Insufficient documentation

## 2014-05-13 DIAGNOSIS — I252 Old myocardial infarction: Secondary | ICD-10-CM | POA: Insufficient documentation

## 2014-05-13 DIAGNOSIS — F17200 Nicotine dependence, unspecified, uncomplicated: Secondary | ICD-10-CM | POA: Insufficient documentation

## 2014-05-13 DIAGNOSIS — E119 Type 2 diabetes mellitus without complications: Secondary | ICD-10-CM | POA: Insufficient documentation

## 2014-05-13 DIAGNOSIS — K219 Gastro-esophageal reflux disease without esophagitis: Secondary | ICD-10-CM | POA: Insufficient documentation

## 2014-05-13 DIAGNOSIS — I509 Heart failure, unspecified: Secondary | ICD-10-CM | POA: Insufficient documentation

## 2014-05-13 DIAGNOSIS — R1033 Periumbilical pain: Secondary | ICD-10-CM | POA: Insufficient documentation

## 2014-05-13 DIAGNOSIS — Z9089 Acquired absence of other organs: Secondary | ICD-10-CM | POA: Insufficient documentation

## 2014-05-13 DIAGNOSIS — Z7901 Long term (current) use of anticoagulants: Secondary | ICD-10-CM | POA: Insufficient documentation

## 2014-05-13 DIAGNOSIS — Z9981 Dependence on supplemental oxygen: Secondary | ICD-10-CM | POA: Insufficient documentation

## 2014-05-13 DIAGNOSIS — I4891 Unspecified atrial fibrillation: Secondary | ICD-10-CM | POA: Insufficient documentation

## 2014-05-13 DIAGNOSIS — Z86711 Personal history of pulmonary embolism: Secondary | ICD-10-CM | POA: Insufficient documentation

## 2014-05-13 DIAGNOSIS — Z8701 Personal history of pneumonia (recurrent): Secondary | ICD-10-CM | POA: Insufficient documentation

## 2014-05-13 DIAGNOSIS — Z8619 Personal history of other infectious and parasitic diseases: Secondary | ICD-10-CM | POA: Insufficient documentation

## 2014-05-13 DIAGNOSIS — R112 Nausea with vomiting, unspecified: Secondary | ICD-10-CM | POA: Insufficient documentation

## 2014-05-13 DIAGNOSIS — G8929 Other chronic pain: Secondary | ICD-10-CM | POA: Insufficient documentation

## 2014-05-13 DIAGNOSIS — Z950 Presence of cardiac pacemaker: Secondary | ICD-10-CM | POA: Insufficient documentation

## 2014-05-13 LAB — CBC WITH DIFFERENTIAL/PLATELET
BLASTS: 0 %
Band Neutrophils: 0 % (ref 0–10)
Basophils Absolute: 0 10*3/uL (ref 0.0–0.1)
Basophils Relative: 0 % (ref 0–1)
Eosinophils Absolute: 0.1 10*3/uL (ref 0.0–0.7)
Eosinophils Relative: 2 % (ref 0–5)
HCT: 37.8 % (ref 36.0–46.0)
Hemoglobin: 12.7 g/dL (ref 12.0–15.0)
Lymphocytes Relative: 33 % (ref 12–46)
Lymphs Abs: 2.3 10*3/uL (ref 0.7–4.0)
MCH: 34 pg (ref 26.0–34.0)
MCHC: 33.6 g/dL (ref 30.0–36.0)
MCV: 101.1 fL — AB (ref 78.0–100.0)
MONOS PCT: 9 % (ref 3–12)
MYELOCYTES: 0 %
Metamyelocytes Relative: 0 %
Monocytes Absolute: 0.6 10*3/uL (ref 0.1–1.0)
NRBC: 0 /100{WBCs}
Neutro Abs: 4.1 10*3/uL (ref 1.7–7.7)
Neutrophils Relative %: 56 % (ref 43–77)
PLATELETS: 173 10*3/uL (ref 150–400)
Promyelocytes Absolute: 0 %
RBC: 3.74 MIL/uL — ABNORMAL LOW (ref 3.87–5.11)
RDW: 14.6 % (ref 11.5–15.5)
WBC: 7.1 10*3/uL (ref 4.0–10.5)

## 2014-05-13 LAB — COMPREHENSIVE METABOLIC PANEL
ALK PHOS: 69 U/L (ref 39–117)
ALT: 33 U/L (ref 0–35)
AST: 43 U/L — ABNORMAL HIGH (ref 0–37)
Albumin: 3.2 g/dL — ABNORMAL LOW (ref 3.5–5.2)
BUN: 8 mg/dL (ref 6–23)
CHLORIDE: 101 meq/L (ref 96–112)
CO2: 27 meq/L (ref 19–32)
Calcium: 8.2 mg/dL — ABNORMAL LOW (ref 8.4–10.5)
Creatinine, Ser: 0.68 mg/dL (ref 0.50–1.10)
GFR calc Af Amer: >90 mL/min (ref >90)
GLUCOSE: 93 mg/dL (ref 70–99)
POTASSIUM: 4.5 meq/L (ref 3.7–5.3)
SODIUM: 138 meq/L (ref 137–147)
TOTAL PROTEIN: 6.9 g/dL (ref 6.0–8.3)
Total Bilirubin: 0.4 mg/dL (ref 0.3–1.2)

## 2014-05-13 LAB — URINALYSIS, ROUTINE W REFLEX MICROSCOPIC
Bilirubin Urine: NEGATIVE
Glucose, UA: NEGATIVE mg/dL
HGB URINE DIPSTICK: NEGATIVE
KETONES UR: NEGATIVE mg/dL
Leukocytes, UA: NEGATIVE
Nitrite: NEGATIVE
Protein, ur: NEGATIVE mg/dL
SPECIFIC GRAVITY, URINE: 1.025 (ref 1.005–1.030)
UROBILINOGEN UA: 0.2 mg/dL (ref 0.0–1.0)
pH: 5.5 (ref 5.0–8.0)

## 2014-05-13 LAB — LACTIC ACID, PLASMA: Lactic Acid, Venous: 0.7 mmol/L (ref 0.5–2.2)

## 2014-05-13 LAB — PROTIME-INR
INR: 1.64 — ABNORMAL HIGH (ref 0.00–1.49)
PROTHROMBIN TIME: 19 s — AB (ref 11.6–15.2)

## 2014-05-13 LAB — LIPASE, BLOOD: LIPASE: 36 U/L (ref 11–59)

## 2014-05-13 MED ORDER — METOCLOPRAMIDE HCL 10 MG PO TABS: 10.0000 mg | ORAL_TABLET | Freq: Four times a day (QID) | ORAL | Status: DC | PRN

## 2014-05-13 MED ORDER — ONDANSETRON HCL 4 MG/2ML IJ SOLN
4.0000 mg | Freq: Once | INTRAMUSCULAR | Status: AC
Start: 2014-05-13 — End: 2014-05-13
  Administered 2014-05-13: 4 mg via INTRAVENOUS
  Filled 2014-05-13: qty 2

## 2014-05-13 MED ORDER — IPRATROPIUM-ALBUTEROL 0.5-2.5 (3) MG/3ML IN SOLN
3.0000 mL | Freq: Once | RESPIRATORY_TRACT | Status: AC
  Administered 2014-05-13: 3 mL via RESPIRATORY_TRACT
  Filled 2014-05-13: qty 3

## 2014-05-13 MED ORDER — DIPHENHYDRAMINE HCL 50 MG/ML IJ SOLN
25.0000 mg | Freq: Once | INTRAMUSCULAR | Status: AC
  Administered 2014-05-13: 25 mg via INTRAVENOUS
  Filled 2014-05-13: qty 1

## 2014-05-13 MED ORDER — OXYCODONE HCL 5 MG PO TABS: 5.0000 mg | ORAL_TABLET | ORAL | Status: DC | PRN

## 2014-05-13 MED ORDER — MORPHINE SULFATE 4 MG/ML IJ SOLN
4.0000 mg | Freq: Once | INTRAMUSCULAR | Status: AC
  Administered 2014-05-13: 4 mg via INTRAVENOUS
  Filled 2014-05-13: qty 1

## 2014-05-13 MED ORDER — METOCLOPRAMIDE HCL 5 MG/ML IJ SOLN
10.0000 mg | Freq: Once | INTRAMUSCULAR | Status: AC
  Administered 2014-05-13: 10 mg via INTRAVENOUS
  Filled 2014-05-13: qty 2

## 2014-05-13 NOTE — ED Provider Notes (Signed)
CSN: [id number]     Arrival date & time 05/13/14  0005 History   First MD Initiated Contact with Patient 05/13/14 0032 This chart was scribed for Dione Booze, MD by Valera Castle, ED Scribe. This patient was seen in room APA09/APA09 and the patient's care was started at 12:36 AM.     Chief Complaint  Patient presents with  . Abdominal Pain   (Consider location/radiation/quality/duration/timing/severity/associated sxs/prior Treatment) The history is provided by the patient. No language interpreter was used.   HPI Comments: Kim [name] is a 48 y.o. female with h/o Hep-C, cirrhosis, and GERD, who presents to the Emergency Department complaining of gradually worsening, 9/10, periumbilical abdominal pain, that does not radiate, onset 4 days ago, with associated nausea, vomiting, and decreased appetite secondary to her nausea. She denies her current abdominal pain being consistent with her h/o abdominal pain. She denies blood in her vomit. She reports associated dysuria, decreased urine output, and LE swelling. She reports having normal bowel movements. She denies anything exacerbating or relieving her abdominal pain including her bowel movements. She denies constipation, diarrhea, urinary frequency, and any other associated symptoms. She also reports h/o COPD, has SOB when walking recently. She denies having been to her cardiologist in some time.   PCP - Dorian Heckle, MD  Past Medical History  Diagnosis Date  . Pacemaker     vent paced  . COPD (chronic obstructive pulmonary disease)   . CHF (congestive heart failure)   . Thyroid disease   . Hepatitis     Hep C  . MI (myocardial infarction)   . Pneumonia   . Dysrhythmia     atrial fibrilation  . Cirrhosis   . Shortness of breath   . Diabetes mellitus   . Chest pain   . GERD (gastroesophageal reflux disease)   . Headache(784.0)   . Anxiety   . Permanent atrial fibrillation   . Chronic pain   . PE (pulmonary embolism)   . On home O2      3L N/C  . Chronic anticoagulation   . History of medication noncompliance    Past Surgical History  Procedure Laterality Date  . Cholecystectomy    . Pacemaker insertion  02/2012    Biotronik PPM   . Cardiac electrophysiology study and ablation    . Esophagogastroduodenoscopy  Jan 2010    Dr. Arlyce Dice: multiple gastric erosions, benign path  . Esophagogastroduodenoscopy N/A 01/10/2014    VZC:HYIFOYDX EG junction. Query short segment Barrett's esophagus-status post pinch biopsy as described above. Hiatal hernia; otherwise negative EGD  . Cardiac catheterization  07/2012    mild non-obstructive CAD   Family History  Problem Relation Age of Onset  . Coronary artery disease Father 34  . Coronary artery disease Mother 33  . Coronary artery disease Brother   . Colon cancer Neg Hx    History  Substance Use Topics  . Smoking status: Current Every Day Smoker -- 0.50 packs/day for 30 years    Types: Cigarettes  . Smokeless tobacco: Current User    Types: Snuff  . Alcohol Use: No   OB History   Grav Para Term Preterm Abortions TAB SAB Ect Mult Living                 Review of Systems  All other systems reviewed and are negative.  Allergies  Nitroglycerin; Doxycycline; Flexeril; Ibuprofen; Tramadol; Tylenol; Vesicare; and Amoxil  Home Medications   Prior to Admission medications   Medication  Sig Start Date End Date Taking? Authorizing Provider  albuterol (PROVENTIL HFA;VENTOLIN HFA) 108 (90 BASE) MCG/ACT inhaler Inhale 2 puffs into the lungs every 6 (six) hours as needed for wheezing or shortness of breath.    Yes Historical Provider, MD  atorvastatin (LIPITOR) 80 MG tablet Take 1 tablet (80 mg total) by mouth at bedtime. 01/11/14  Yes Elease Etienne, MD  budesonide-formoterol (SYMBICORT) 80-4.5 MCG/ACT inhaler Inhale 2 puffs into the lungs 2 (two) times daily.   Yes Historical Provider, MD  esomeprazole (NEXIUM) 40 MG capsule Take 40 mg by mouth daily at 12 noon.   Yes  Historical Provider, MD  furosemide (LASIX) 40 MG tablet Take 2 tablets (80 mg total) by mouth 2 (two) times daily. 03/10/14  Yes Hollice Espy, MD  metoprolol (LOPRESSOR) 50 MG tablet Take 25 mg by mouth 3 (three) times daily.   Yes Historical Provider, MD  OxyCODONE (OXYCONTIN) 10 mg T12A 12 hr tablet Take 10 mg by mouth every 12 (twelve) hours.   Yes Historical Provider, MD  QUEtiapine (SEROQUEL) 25 MG tablet Take 50 mg by mouth 2 (two) times daily. 12/08/13  Yes Historical Provider, MD  tiotropium (SPIRIVA) 18 MCG inhalation capsule Place 18 mcg into inhaler and inhale daily.   Yes Historical Provider, MD  warfarin (COUMADIN) 4 MG tablet Take 4 mg by mouth at bedtime.   Yes Historical Provider, MD   BP 149/86  Pulse 75  Temp(Src) 97.6 F (36.4 C) (Oral)  Resp 24  Ht 5\' 6"  (1.676 m)  SpO2 99%  LMP 02/08/2007 Physical Exam  Nursing note and vitals reviewed. Constitutional: She is oriented to person, place, and time. She appears well-developed and well-nourished. No distress.  HENT:  Head: Normocephalic and atraumatic.  Eyes: Conjunctivae and EOM are normal. Pupils are equal, round, and reactive to light.  Neck: Normal range of motion. Neck supple. No JVD present. No tracheal deviation present.  Cardiovascular: Normal rate, regular rhythm and normal heart sounds.   No murmur heard. Pulmonary/Chest: Effort normal. No respiratory distress. She has wheezes (Mild expiratory wheezing.). She has no rales.  Pacemaker left subclavian area.   Abdominal: Soft. She exhibits no mass. Bowel sounds are decreased. There is tenderness (Mild diffuse tenderness.). There is no rebound and no guarding.  Musculoskeletal: Normal range of motion. She exhibits edema.  Trace edema.   Lymphadenopathy:    She has no cervical adenopathy.  Neurological: She is alert and oriented to person, place, and time. She has normal reflexes. No cranial nerve deficit. Coordination normal.  Skin: Skin is warm and dry. No  rash noted.  Psychiatric: She has a normal mood and affect. Her behavior is normal. Thought content normal.   ED Course  Procedures (including critical care time)  DIAGNOSTIC STUDIES: Oxygen Saturation is 99% on room air, normal by my interpretation.    COORDINATION OF CARE: 12:42 AM-Discussed treatment plan which includes breathing treatment, blood work, UA, and EKG with pt at bedside and pt agreed to plan.   Labs Review Results for orders placed during the hospital encounter of 05/13/14  CBC WITH DIFFERENTIAL      Result Value Ref Range   WBC 7.1  4.0 - 10.5 K/uL   RBC 3.74 (*) 3.87 - 5.11 MIL/uL   Hemoglobin 12.7  12.0 - 15.0 g/dL   HCT 40.9  81.1 - 91.4 %   MCV 101.1 (*) 78.0 - 100.0 fL   MCH 34.0  26.0 - 34.0 pg   MCHC  33.6  30.0 - 36.0 g/dL   RDW 16.114.6  09.611.5 - 04.515.5 %   Platelets 173  150 - 400 K/uL   Neutrophils Relative % 56  43 - 77 %   Lymphocytes Relative 33  12 - 46 %   Monocytes Relative 9  3 - 12 %   Eosinophils Relative 2  0 - 5 %   Basophils Relative 0  0 - 1 %   Band Neutrophils 0  0 - 10 %   Metamyelocytes Relative 0     Myelocytes 0     Promyelocytes Absolute 0     Blasts 0     nRBC 0  0 /100 WBC   Neutro Abs 4.1  1.7 - 7.7 K/uL   Lymphs Abs 2.3  0.7 - 4.0 K/uL   Monocytes Absolute 0.6  0.1 - 1.0 K/uL   Eosinophils Absolute 0.1  0.0 - 0.7 K/uL   Basophils Absolute 0.0  0.0 - 0.1 K/uL  COMPREHENSIVE METABOLIC PANEL      Result Value Ref Range   Sodium 138  137 - 147 mEq/L   Potassium 4.5  3.7 - 5.3 mEq/L   Chloride 101  96 - 112 mEq/L   CO2 27  19 - 32 mEq/L   Glucose, Bld 93  70 - 99 mg/dL   BUN 8  6 - 23 mg/dL   Creatinine, Ser 4.090.68  0.50 - 1.10 mg/dL   Calcium 8.2 (*) 8.4 - 10.5 mg/dL   Total Protein 6.9  6.0 - 8.3 g/dL   Albumin 3.2 (*) 3.5 - 5.2 g/dL   AST 43 (*) 0 - 37 U/L   ALT 33  0 - 35 U/L   Alkaline Phosphatase 69  39 - 117 U/L   Total Bilirubin 0.4  0.3 - 1.2 mg/dL   GFR calc non Af Amer >90  >90 mL/min   GFR calc Af Amer >90  >90  mL/min  LIPASE, BLOOD      Result Value Ref Range   Lipase 36  11 - 59 U/L  URINALYSIS, ROUTINE W REFLEX MICROSCOPIC      Result Value Ref Range   Color, Urine YELLOW  YELLOW   APPearance CLEAR  CLEAR   Specific Gravity, Urine 1.025  1.005 - 1.030   pH 5.5  5.0 - 8.0   Glucose, UA NEGATIVE  NEGATIVE mg/dL   Hgb urine dipstick NEGATIVE  NEGATIVE   Bilirubin Urine NEGATIVE  NEGATIVE   Ketones, ur NEGATIVE  NEGATIVE mg/dL   Protein, ur NEGATIVE  NEGATIVE mg/dL   Urobilinogen, UA 0.2  0.0 - 1.0 mg/dL   Nitrite NEGATIVE  NEGATIVE   Leukocytes, UA NEGATIVE  NEGATIVE  LACTIC ACID, PLASMA      Result Value Ref Range   Lactic Acid, Venous 0.7  0.5 - 2.2 mmol/L  PROTIME-INR      Result Value Ref Range   Prothrombin Time 19.0 (*) 11.6 - 15.2 seconds   INR 1.64 (*) 0.00 - 1.49    EKG Interpretation None     Medications  ipratropium-albuterol (DUONEB) 0.5-2.5 (3) MG/3ML nebulizer solution 3 mL (not administered)  morphine 4 MG/ML injection 4 mg (not administered)  ondansetron (ZOFRAN) injection 4 mg (not administered)   MDM   Final diagnoses:  Abdominal pain  Nausea & vomiting    Abdominal pain of uncertain cause. No peritoneal findings on exam. Incidental finding of wheezing is noted and she's given a nebulizer treatment with albuterol ipratropium with  some improvement. She was given morphine for pain and ondansetron for nausea. There is modest improvement in pain and no improvement nausea. She was given additional morphine and a dose of metoclopramide with better relief of nausea. Urinalysis is obtained showing no evidence of UTI, and CBC and metabolic panels are unremarkable. Cause for pain is unclear. ECG does show atrial flutter, and 100% pacemaker ventricular rhythm. INR is subtherapeutic and patient is advised to contact her physician to see how he/she wishes to adjust the warfarin dose. No evidence of serious intra-abdominal pathology at this point. Patient is reassured and  discharged with prescriptions for oxycodone and acetaminophen, and metoclopramide for nausea. She is to followup with her PCP.  I personally performed the services described in this documentation, which was scribed in my presence. The recorded information has been reviewed and is accurate.     Dione Booze, MD 05/13/14 [phone]

## 2014-05-13 NOTE — ED Notes (Signed)
Patient c/o lower abdominal pain for 4-5 days.  Patient states she vomited twice yesterday.

## 2014-05-13 NOTE — Discharge Instructions (Signed)
Abdominal Pain, Adult Many things can cause abdominal pain. Usually, abdominal pain is not caused by a disease and will improve without treatment. It can often be observed and treated at home. Your health care provider will do a physical exam and possibly order blood tests and X-rays to help determine the seriousness of your pain. However, in many cases, more time must pass before a clear cause of the pain can be found. Before that point, your health care provider may not know if you need more testing or further treatment. HOME CARE INSTRUCTIONS  Monitor your abdominal pain for any changes. The following actions may help to alleviate any discomfort you are experiencing:  Only take over-the-counter or prescription medicines as directed by your health care provider.  Do not take laxatives unless directed to do so by your health care provider.  Try a clear liquid diet (broth, tea, or water) as directed by your health care provider. Slowly move to a bland diet as tolerated. SEEK MEDICAL CARE IF:  You have unexplained abdominal pain.  You have abdominal pain associated with nausea or diarrhea.  You have pain when you urinate or have a bowel movement.  You experience abdominal pain that wakes you in the night.  You have abdominal pain that is worsened or improved by eating food.  You have abdominal pain that is worsened with eating fatty foods. SEEK IMMEDIATE MEDICAL CARE IF:   Your pain does not go away within 2 hours.  You have a fever.  You keep throwing up (vomiting).  Your pain is felt only in portions of the abdomen, such as the right side or the left lower portion of the abdomen.  You pass bloody or black tarry stools. MAKE SURE YOU:  Understand these instructions.   Will watch your condition.   Will get help right away if you are not doing well or get worse.  Document Released: 09/02/2005 Document Revised: 09/13/2013 Document Reviewed: 08/02/2013 [facility] Patient  Information 2014 [place].  Nausea and Vomiting Nausea is a sick feeling that often comes before throwing up (vomiting). Vomiting is a reflex where stomach contents come out of your mouth. Vomiting can cause severe loss of body fluids (dehydration). Children and elderly adults can become dehydrated quickly, especially if they also have diarrhea. Nausea and vomiting are symptoms of a condition or disease. It is important to find the cause of your symptoms. CAUSES   Direct irritation of the stomach lining. This irritation can result from increased acid production (gastroesophageal reflux disease), infection, food poisoning, taking certain medicines (such as nonsteroidal anti-inflammatory drugs), alcohol use, or tobacco use.  Signals from the brain.These signals could be caused by a headache, heat exposure, an inner ear disturbance, increased pressure in the brain from injury, infection, a tumor, or a concussion, pain, emotional stimulus, or metabolic problems.  An obstruction in the gastrointestinal tract (bowel obstruction).  Illnesses such as diabetes, hepatitis, gallbladder problems, appendicitis, kidney problems, cancer, sepsis, atypical symptoms of a heart attack, or eating disorders.  Medical treatments such as chemotherapy and radiation.  Receiving medicine that makes you sleep (general anesthetic) during surgery. DIAGNOSIS Your caregiver may ask for tests to be done if the problems do not improve after a few days. Tests may also be done if symptoms are severe or if the reason for the nausea and vomiting is not clear. Tests may include:  Urine tests.  Blood tests.  Stool tests.  Cultures (to look for evidence of infection).  X-rays or  other imaging studies. Test results can help your caregiver make decisions about treatment or the need for additional tests. TREATMENT You need to stay well hydrated. Drink frequently but in small amounts.You may wish to drink water, sports  drinks, clear broth, or eat frozen ice pops or gelatin dessert to help stay hydrated.When you eat, eating slowly may help prevent nausea.There are also some antinausea medicines that may help prevent nausea. HOME CARE INSTRUCTIONS   Take all medicine as directed by your caregiver.  If you do not have an appetite, do not force yourself to eat. However, you must continue to drink fluids.  If you have an appetite, eat a normal diet unless your caregiver tells you differently.  Eat a variety of complex carbohydrates (rice, wheat, potatoes, bread), lean meats, yogurt, fruits, and vegetables.  Avoid high-fat foods because they are more difficult to digest.  Drink enough water and fluids to keep your urine clear or pale yellow.  If you are dehydrated, ask your caregiver for specific rehydration instructions. Signs of dehydration may include:  Severe thirst.  Dry lips and mouth.  Dizziness.  Dark urine.  Decreasing urine frequency and amount.  Confusion.  Rapid breathing or pulse. SEEK IMMEDIATE MEDICAL CARE IF:   You have blood or brown flecks (like coffee grounds) in your vomit.  You have black or bloody stools.  You have a severe headache or stiff neck.  You are confused.  You have severe abdominal pain.  You have chest pain or trouble breathing.  You do not urinate at least once every 8 hours.  You develop cold or clammy skin.  You continue to vomit for longer than 24 to 48 hours.  You have a fever. MAKE SURE YOU:   Understand these instructions.  Will watch your condition.  Will get help right away if you are not doing well or get worse. Document Released: 11/23/2005 Document Revised: 02/15/2012 Document Reviewed: 04/22/2011 [facility] Patient Information 2014 [place], [place].  Oxycodone tablets or capsules What is this medicine? OXYCODONE (ox i KOE done) is a pain reliever. It is used to treat moderate to severe pain. This medicine may be used for other  purposes; ask your health care provider or pharmacist if you have questions. COMMON BRAND NAME(S): Dazidox , Endocodone , OXECTA, OxyIR, Percolone, Roxicodone What should I tell my health care provider before I take this medicine? They need to know if you have any of these conditions: -Addison's disease -brain tumor -drug abuse or addiction -head injury -heart disease -if you frequently drink alcohol containing drinks -kidney disease or problems going to the bathroom -liver disease -lung disease, asthma, or breathing problems -mental problems -an unusual or allergic reaction to oxycodone, codeine, hydrocodone, morphine, other medicines, foods, dyes, or preservatives -pregnant or trying to get pregnant -breast-feeding How should I use this medicine? Take this medicine by mouth with a glass of water. Follow the directions on the prescription label. You can take it with or without food. If it upsets your stomach, take it with food. Take your medicine at regular intervals. Do not take it more often than directed. Do not stop taking except on your doctor's advice. Some brands of this medicine, like Oxecta, have special instructions. Ask your doctor or pharmacist if these directions are for you: Do not cut, crush or chew this medicine. Swallow only one tablet at a time. Do not wet, soak, or lick the tablet before you take it. Talk to your pediatrician regarding the use of this  medicine in children. Special care may be needed. Overdosage: If you think you have taken too much of this medicine contact a poison control center or emergency room at once. NOTE: This medicine is only for you. Do not share this medicine with others. What if I miss a dose? If you miss a dose, take it as soon as you can. If it is almost time for your next dose, take only that dose. Do not take double or extra doses. What may interact with this medicine? -alcohol -antihistamines -certain medicines used for nausea like  chlorpromazine, droperidol -erythromycin -ketoconazole -medicines for depression, anxiety, or psychotic disturbances -medicines for sleep -muscle relaxants -naloxone -naltrexone -narcotic medicines (opiates) for pain -nilotinib -phenobarbital -phenytoin -rifampin -ritonavir -voriconazole This list may not describe all possible interactions. Give your health care provider a list of all the medicines, herbs, non-prescription drugs, or dietary supplements you use. Also tell them if you smoke, drink alcohol, or use illegal drugs. Some items may interact with your medicine. What should I watch for while using this medicine? Tell your doctor or health care professional if your pain does not go away, if it gets worse, or if you have new or a different type of pain. You may develop tolerance to the medicine. Tolerance means that you will need a higher dose of the medicine for pain relief. Tolerance is normal and is expected if you take this medicine for a long time. Do not suddenly stop taking your medicine because you may develop a severe reaction. Your body becomes used to the medicine. This does NOT mean you are addicted. Addiction is a behavior related to getting and using a drug for a non-medical reason. If you have pain, you have a medical reason to take pain medicine. Your doctor will tell you how much medicine to take. If your doctor wants you to stop the medicine, the dose will be slowly lowered over time to avoid any side effects. You may get drowsy or dizzy when you first start taking this medicine or change doses. Do not drive, use machinery, or do anything that may be dangerous until you know how the medicine affects you. Stand or sit up slowly. There are different types of narcotic medicines (opiates) for pain. If you take more than one type at the same time, you may have more side effects. Give your health care provider a list of all medicines you use. Your doctor will tell you how much  medicine to take. Do not take more medicine than directed. Call emergency for help if you have problems breathing. This medicine will cause constipation. Try to have a bowel movement at least every 2 to 3 days. If you do not have a bowel movement for 3 days, call your doctor or health care professional. Your mouth may get dry. Drinking water, chewing sugarless gum, or sucking on hard candy may help. See your dentist every 6 months. What side effects may I notice from receiving this medicine? Side effects that you should report to your doctor or health care professional as soon as possible: -allergic reactions like skin rash, itching or hives, swelling of the face, lips, or tongue -breathing problems -confusion -feeling faint or lightheaded, falls -trouble passing urine or change in the amount of urine -unusually weak or tired Side effects that usually do not require medical attention (report to your doctor or health care professional if they continue or are bothersome): -constipation -dry mouth -itching -nausea, vomiting -upset stomach This list may not  describe all possible side effects. Call your doctor for medical advice about side effects. You may report side effects to FDA at 1-800-FDA-1088. Where should I keep my medicine? Keep out of the reach of children. This medicine can be abused. Keep your medicine in a safe place to protect it from theft. Do not share this medicine with anyone. Selling or giving away this medicine is dangerous and against the law. Store at room temperature between 15 and 30 degrees C (59 and 86 degrees F). Protect from light. Keep container tightly closed. This medicine may cause accidental overdose and death if it is taken by other adults, children, or pets. Flush any unused medicine down the toilet to reduce the chance of harm. Do not use the medicine after the expiration date. NOTE: This sheet is a summary. It may not cover all possible information. If you have  questions about this medicine, talk to your doctor, pharmacist, or health care provider.  2014, [employer] Standard. (2013-08-03 13:43:33)  Metoclopramide tablets What is this medicine? METOCLOPRAMIDE (met oh kloe PRA mide) is used to treat the symptoms of gastroesophageal reflux disease (GERD) like heartburn. It is also used to treat people with slow emptying of the stomach and intestinal tract. This medicine may be used for other purposes; ask your health care provider or pharmacist if you have questions. COMMON BRAND NAME(S): Reglan What should I tell my health care provider before I take this medicine? They need to know if you have any of these conditions: -breast cancer -depression -diabetes -heart failure -high blood pressure -kidney disease -liver disease -Parkinson's disease or a movement disorder -pheochromocytoma -seizures -stomach obstruction, bleeding, or perforation -an unusual or allergic reaction to metoclopramide, procainamide, sulfites, other medicines, foods, dyes, or preservatives -pregnant or trying to get pregnant -breast-feeding How should I use this medicine? Take this medicine by mouth with a glass of water. Follow the directions on the prescription label. Take this medicine on an empty stomach, about 30 minutes before eating. Take your doses at regular intervals. Do not take your medicine more often than directed. Do not stop taking except on the advice of your doctor or health care professional. A special MedGuide will be given to you by the pharmacist with each prescription and refill. Be sure to read this information carefully each time. Talk to your pediatrician regarding the use of this medicine in children. Special care may be needed. Overdosage: If you think you have taken too much of this medicine contact a poison control center or emergency room at once. NOTE: This medicine is only for you. Do not share this medicine with others. What if I miss a  dose? If you miss a dose, take it as soon as you can. If it is almost time for your next dose, take only that dose. Do not take double or extra doses. What may interact with this medicine? -acetaminophen -cyclosporine -digoxin -medicines for blood pressure -medicines for diabetes, including insulin -medicines for hay fever and other allergies -medicines for depression, especially an Monoamine Oxidase Inhibitor (MAOI) -medicines for Parkinson's disease, like levodopa -medicines for sleep or for pain -tetracycline This list may not describe all possible interactions. Give your health care provider a list of all the medicines, herbs, non-prescription drugs, or dietary supplements you use. Also tell them if you smoke, drink alcohol, or use illegal drugs. Some items may interact with your medicine. What should I watch for while using this medicine? It may take a few weeks for your stomach  condition to start to get better. However, do not take this medicine for longer than 12 weeks. The longer you take this medicine, and the more you take it, the greater your chances are of developing serious side effects. If you are an elderly patient, a female patient, or you have diabetes, you may be at an increased risk for side effects from this medicine. Contact your doctor immediately if you start having movements you cannot control such as lip smacking, rapid movements of the tongue, involuntary or uncontrollable movements of the eyes, head, arms and legs, or muscle twitches and spasms. Patients and their families should watch out for worsening depression or thoughts of suicide. Also watch out for any sudden or severe changes in feelings such as feeling anxious, agitated, panicky, irritable, hostile, aggressive, impulsive, severely restless, overly excited and hyperactive, or not being able to sleep. If this happens, especially at the beginning of treatment or after a change in dose, call your doctor. Do not treat  yourself for high fever. Ask your doctor or health care professional for advice. You may get drowsy or dizzy. Do not drive, use machinery, or do anything that needs mental alertness until you know how this drug affects you. Do not stand or sit up quickly, especially if you are an older patient. This reduces the risk of dizzy or fainting spells. Alcohol can make you more drowsy and dizzy. Avoid alcoholic drinks. What side effects may I notice from receiving this medicine? Side effects that you should report to your doctor or health care professional as soon as possible: -allergic reactions like skin rash, itching or hives, swelling of the face, lips, or tongue -abnormal production of milk in females -breast enlargement in both males and females -change in the way you walk -difficulty moving, speaking or swallowing -drooling, lip smacking, or rapid movements of the tongue -excessive sweating -fever -involuntary or uncontrollable movements of the eyes, head, arms and legs -irregular heartbeat or palpitations -muscle twitches and spasms -unusually weak or tired Side effects that usually do not require medical attention (report to your doctor or health care professional if they continue or are bothersome): -change in sex drive or performance -depressed mood -diarrhea -difficulty sleeping -headache -menstrual changes -restless or nervous This list may not describe all possible side effects. Call your doctor for medical advice about side effects. You may report side effects to FDA at 1-800-FDA-1088. Where should I keep my medicine? Keep out of the reach of children. Store at room temperature between 20 and 25 degrees C (68 and 77 degrees F). Protect from light. Keep container tightly closed. Throw away any unused medicine after the expiration date. NOTE: This sheet is a summary. It may not cover all possible information. If you have questions about this medicine, talk to your doctor, pharmacist,  or health care provider.  2014, [employer] Standard. (2012-03-22 13:04:38)

## 2014-05-16 ENCOUNTER — Inpatient Hospital Stay (HOSPITAL_COMMUNITY)
Admission: EM | Admit: 2014-05-16 | Discharge: 2014-05-22 | DRG: 292 | Disposition: A | Discharge status: Home Health | Payer: Medicaid Other | Attending: Internal Medicine | Admitting: Internal Medicine

## 2014-05-16 ENCOUNTER — Emergency Department (HOSPITAL_COMMUNITY): Payer: Medicaid Other

## 2014-05-16 ENCOUNTER — Encounter (HOSPITAL_COMMUNITY): Payer: Self-pay | Admitting: Emergency Medicine

## 2014-05-16 DIAGNOSIS — F17200 Nicotine dependence, unspecified, uncomplicated: Secondary | ICD-10-CM | POA: Diagnosis present

## 2014-05-16 DIAGNOSIS — K5900 Constipation, unspecified: Secondary | ICD-10-CM | POA: Diagnosis present

## 2014-05-16 DIAGNOSIS — K089 Disorder of teeth and supporting structures, unspecified: Secondary | ICD-10-CM | POA: Diagnosis present

## 2014-05-16 DIAGNOSIS — K7460 Unspecified cirrhosis of liver: Secondary | ICD-10-CM | POA: Diagnosis present

## 2014-05-16 DIAGNOSIS — Z91199 Patient's noncompliance with other medical treatment and regimen due to unspecified reason: Secondary | ICD-10-CM

## 2014-05-16 DIAGNOSIS — R7989 Other specified abnormal findings of blood chemistry: Secondary | ICD-10-CM

## 2014-05-16 DIAGNOSIS — Z720 Tobacco use: Secondary | ICD-10-CM

## 2014-05-16 DIAGNOSIS — J449 Chronic obstructive pulmonary disease, unspecified: Secondary | ICD-10-CM

## 2014-05-16 DIAGNOSIS — I4891 Unspecified atrial fibrillation: Secondary | ICD-10-CM | POA: Diagnosis present

## 2014-05-16 DIAGNOSIS — F1021 Alcohol dependence, in remission: Secondary | ICD-10-CM

## 2014-05-16 DIAGNOSIS — R002 Palpitations: Secondary | ICD-10-CM

## 2014-05-16 DIAGNOSIS — Z950 Presence of cardiac pacemaker: Secondary | ICD-10-CM

## 2014-05-16 DIAGNOSIS — Z8249 Family history of ischemic heart disease and other diseases of the circulatory system: Secondary | ICD-10-CM

## 2014-05-16 DIAGNOSIS — F341 Dysthymic disorder: Secondary | ICD-10-CM | POA: Diagnosis present

## 2014-05-16 DIAGNOSIS — I509 Heart failure, unspecified: Secondary | ICD-10-CM | POA: Diagnosis present

## 2014-05-16 DIAGNOSIS — I428 Other cardiomyopathies: Secondary | ICD-10-CM | POA: Diagnosis present

## 2014-05-16 DIAGNOSIS — R079 Chest pain, unspecified: Secondary | ICD-10-CM

## 2014-05-16 DIAGNOSIS — E669 Obesity, unspecified: Secondary | ICD-10-CM | POA: Diagnosis present

## 2014-05-16 DIAGNOSIS — B182 Chronic viral hepatitis C: Secondary | ICD-10-CM | POA: Diagnosis present

## 2014-05-16 DIAGNOSIS — E876 Hypokalemia: Secondary | ICD-10-CM

## 2014-05-16 DIAGNOSIS — B1920 Unspecified viral hepatitis C without hepatic coma: Secondary | ICD-10-CM

## 2014-05-16 DIAGNOSIS — E8809 Other disorders of plasma-protein metabolism, not elsewhere classified: Secondary | ICD-10-CM | POA: Diagnosis present

## 2014-05-16 DIAGNOSIS — I5022 Chronic systolic (congestive) heart failure: Secondary | ICD-10-CM

## 2014-05-16 DIAGNOSIS — K219 Gastro-esophageal reflux disease without esophagitis: Secondary | ICD-10-CM | POA: Diagnosis present

## 2014-05-16 DIAGNOSIS — I5023 Acute on chronic systolic (congestive) heart failure: Secondary | ICD-10-CM | POA: Diagnosis present

## 2014-05-16 DIAGNOSIS — G8929 Other chronic pain: Secondary | ICD-10-CM | POA: Diagnosis present

## 2014-05-16 DIAGNOSIS — R109 Unspecified abdominal pain: Secondary | ICD-10-CM

## 2014-05-16 DIAGNOSIS — J4489 Other specified chronic obstructive pulmonary disease: Secondary | ICD-10-CM | POA: Diagnosis present

## 2014-05-16 DIAGNOSIS — Z86711 Personal history of pulmonary embolism: Secondary | ICD-10-CM

## 2014-05-16 DIAGNOSIS — I5033 Acute on chronic diastolic (congestive) heart failure: Secondary | ICD-10-CM

## 2014-05-16 DIAGNOSIS — Z9119 Patient's noncompliance with other medical treatment and regimen: Secondary | ICD-10-CM

## 2014-05-16 DIAGNOSIS — Z6837 Body mass index (BMI) 37.0-37.9, adult: Secondary | ICD-10-CM

## 2014-05-16 DIAGNOSIS — E039 Hypothyroidism, unspecified: Secondary | ICD-10-CM | POA: Diagnosis present

## 2014-05-16 DIAGNOSIS — Z7901 Long term (current) use of anticoagulants: Secondary | ICD-10-CM

## 2014-05-16 DIAGNOSIS — F418 Other specified anxiety disorders: Secondary | ICD-10-CM | POA: Diagnosis present

## 2014-05-16 DIAGNOSIS — I5043 Acute on chronic combined systolic (congestive) and diastolic (congestive) heart failure: Principal | ICD-10-CM | POA: Diagnosis present

## 2014-05-16 DIAGNOSIS — Z9981 Dependence on supplemental oxygen: Secondary | ICD-10-CM

## 2014-05-16 DIAGNOSIS — J9610 Chronic respiratory failure, unspecified whether with hypoxia or hypercapnia: Secondary | ICD-10-CM | POA: Diagnosis present

## 2014-05-16 DIAGNOSIS — E119 Type 2 diabetes mellitus without complications: Secondary | ICD-10-CM | POA: Diagnosis present

## 2014-05-16 DIAGNOSIS — I252 Old myocardial infarction: Secondary | ICD-10-CM

## 2014-05-16 DIAGNOSIS — R778 Other specified abnormalities of plasma proteins: Secondary | ICD-10-CM

## 2014-05-16 DIAGNOSIS — I2510 Atherosclerotic heart disease of native coronary artery without angina pectoris: Secondary | ICD-10-CM | POA: Diagnosis present

## 2014-05-16 HISTORY — DX: Unspecified atrial flutter: I48.92

## 2014-05-16 HISTORY — DX: Unspecified atrial fibrillation: I48.91

## 2014-05-16 HISTORY — DX: Cardiomyopathy, unspecified: I42.9

## 2014-05-16 HISTORY — DX: Sick sinus syndrome: I49.5

## 2014-05-16 HISTORY — DX: Hypothyroidism, unspecified: E03.9

## 2014-05-16 HISTORY — DX: Atherosclerotic heart disease of native coronary artery without angina pectoris: I25.10

## 2014-05-16 HISTORY — DX: Type 2 diabetes mellitus without complications: E11.9

## 2014-05-16 LAB — PROTIME-INR
INR: 1.38 (ref 0.00–1.49)
PROTHROMBIN TIME: 16.6 s — AB (ref 11.6–15.2)

## 2014-05-16 LAB — CBC
HCT: 38 % (ref 36.0–46.0)
HEMOGLOBIN: 12.7 g/dL (ref 12.0–15.0)
MCH: 33.7 pg (ref 26.0–34.0)
MCHC: 33.4 g/dL (ref 30.0–36.0)
MCV: 100.8 fL — ABNORMAL HIGH (ref 78.0–100.0)
PLATELETS: 171 10*3/uL (ref 150–400)
RBC: 3.77 MIL/uL — ABNORMAL LOW (ref 3.87–5.11)
RDW: 14.9 % (ref 11.5–15.5)
WBC: 6.7 10*3/uL (ref 4.0–10.5)

## 2014-05-16 LAB — TROPONIN I
TROPONIN I: 0.38 ng/mL — AB (ref <0.30)
Troponin I: 0.4 ng/mL (ref <0.30)
Troponin I: <0.3 ng/mL (ref <0.30)

## 2014-05-16 LAB — BASIC METABOLIC PANEL
BUN: 5 mg/dL — ABNORMAL LOW (ref 6–23)
CO2: 31 mEq/L (ref 19–32)
Calcium: 8.3 mg/dL — ABNORMAL LOW (ref 8.4–10.5)
Chloride: 100 mEq/L (ref 96–112)
Creatinine, Ser: 0.65 mg/dL (ref 0.50–1.10)
GFR calc Af Amer: >90 mL/min (ref >90)
GFR calc non Af Amer: >90 mL/min (ref >90)
GLUCOSE: 84 mg/dL (ref 70–99)
Potassium: 3.7 mEq/L (ref 3.7–5.3)
SODIUM: 140 meq/L (ref 137–147)

## 2014-05-16 LAB — PRO B NATRIURETIC PEPTIDE: PRO B NATRI PEPTIDE: 2906 pg/mL — AB (ref 0–125)

## 2014-05-16 MED ORDER — ALBUTEROL SULFATE (2.5 MG/3ML) 0.083% IN NEBU: 2.5000 mg | INHALATION_SOLUTION | Freq: Four times a day (QID) | RESPIRATORY_TRACT | Status: DC

## 2014-05-16 MED ORDER — QUETIAPINE FUMARATE 25 MG PO TABS
50.0000 mg | ORAL_TABLET | Freq: Two times a day (BID) | ORAL | Status: DC
  Administered 2014-05-17 – 2014-05-22 (×12): 50 mg via ORAL
  Filled 2014-05-16: qty 2
  Filled 2014-05-16: qty 1
  Filled 2014-05-16: qty 2
  Filled 2014-05-16 (×3): qty 1
  Filled 2014-05-16 (×8): qty 2
  Filled 2014-05-16: qty 1

## 2014-05-16 MED ORDER — ONDANSETRON HCL 4 MG/2ML IJ SOLN
4.0000 mg | Freq: Four times a day (QID) | INTRAMUSCULAR | Status: DC | PRN
  Administered 2014-05-17: 4 mg via INTRAVENOUS
  Filled 2014-05-16: qty 2

## 2014-05-16 MED ORDER — PANTOPRAZOLE SODIUM 40 MG PO TBEC
40.0000 mg | DELAYED_RELEASE_TABLET | Freq: Every day | ORAL | Status: DC
  Administered 2014-05-17 – 2014-05-22 (×6): 40 mg via ORAL
  Filled 2014-05-16 (×6): qty 1

## 2014-05-16 MED ORDER — FUROSEMIDE 10 MG/ML IJ SOLN
80.0000 mg | Freq: Once | INTRAMUSCULAR | Status: AC
  Administered 2014-05-16: 80 mg via INTRAVENOUS
  Filled 2014-05-16: qty 8

## 2014-05-16 MED ORDER — IPRATROPIUM-ALBUTEROL 0.5-2.5 (3) MG/3ML IN SOLN
3.0000 mL | Freq: Four times a day (QID) | RESPIRATORY_TRACT | Status: DC
  Administered 2014-05-16 – 2014-05-22 (×14): 3 mL via RESPIRATORY_TRACT
  Filled 2014-05-16 (×15): qty 3

## 2014-05-16 MED ORDER — ACETAMINOPHEN 650 MG RE SUPP: 650.0000 mg | Freq: Four times a day (QID) | RECTAL | Status: DC | PRN

## 2014-05-16 MED ORDER — QUETIAPINE FUMARATE 25 MG PO TABS
ORAL_TABLET | ORAL | Status: AC
  Filled 2014-05-16: qty 2

## 2014-05-16 MED ORDER — ONDANSETRON HCL 4 MG PO TABS: 4.0000 mg | ORAL_TABLET | Freq: Four times a day (QID) | ORAL | Status: DC | PRN

## 2014-05-16 MED ORDER — MORPHINE SULFATE 2 MG/ML IJ SOLN
2.0000 mg | INTRAMUSCULAR | Status: DC | PRN
  Administered 2014-05-17 – 2014-05-19 (×6): 2 mg via INTRAVENOUS
  Filled 2014-05-16 (×6): qty 1

## 2014-05-16 MED ORDER — IPRATROPIUM BROMIDE 0.02 % IN SOLN: 0.5000 mg | Freq: Four times a day (QID) | RESPIRATORY_TRACT | Status: DC

## 2014-05-16 MED ORDER — METOPROLOL TARTRATE 25 MG PO TABS
25.0000 mg | ORAL_TABLET | Freq: Three times a day (TID) | ORAL | Status: DC
  Administered 2014-05-17 – 2014-05-20 (×11): 25 mg via ORAL
  Filled 2014-05-16 (×11): qty 1

## 2014-05-16 MED ORDER — ACETAMINOPHEN 325 MG PO TABS: 650.0000 mg | ORAL_TABLET | Freq: Four times a day (QID) | ORAL | Status: DC | PRN

## 2014-05-16 MED ORDER — ONDANSETRON HCL 4 MG/2ML IJ SOLN
4.0000 mg | Freq: Once | INTRAMUSCULAR | Status: AC
  Administered 2014-05-16: 4 mg via INTRAVENOUS
  Filled 2014-05-16: qty 2

## 2014-05-16 MED ORDER — ALPRAZOLAM 0.5 MG PO TABS
0.5000 mg | ORAL_TABLET | Freq: Three times a day (TID) | ORAL | Status: DC | PRN
  Administered 2014-05-18 – 2014-05-22 (×7): 0.5 mg via ORAL
  Filled 2014-05-16 (×7): qty 1

## 2014-05-16 MED ORDER — SODIUM CHLORIDE 0.9 % IJ SOLN
3.0000 mL | Freq: Two times a day (BID) | INTRAMUSCULAR | Status: DC
  Administered 2014-05-17 – 2014-05-21 (×10): 3 mL via INTRAVENOUS

## 2014-05-16 MED ORDER — NITROGLYCERIN 2 % TD OINT: 1.0000 [in_us] | TOPICAL_OINTMENT | Freq: Once | TRANSDERMAL | Status: DC

## 2014-05-16 MED ORDER — FUROSEMIDE 10 MG/ML IJ SOLN
80.0000 mg | Freq: Two times a day (BID) | INTRAMUSCULAR | Status: DC
  Administered 2014-05-17 – 2014-05-22 (×11): 80 mg via INTRAVENOUS
  Filled 2014-05-16 (×12): qty 8

## 2014-05-16 MED ORDER — IPRATROPIUM-ALBUTEROL 0.5-2.5 (3) MG/3ML IN SOLN
3.0000 mL | Freq: Once | RESPIRATORY_TRACT | Status: AC
  Administered 2014-05-16: 3 mL via RESPIRATORY_TRACT
  Filled 2014-05-16: qty 3

## 2014-05-16 MED ORDER — ENOXAPARIN SODIUM 100 MG/ML ~~LOC~~ SOLN
100.0000 mg | Freq: Two times a day (BID) | SUBCUTANEOUS | Status: DC
  Administered 2014-05-17 – 2014-05-19 (×6): 100 mg via SUBCUTANEOUS
  Filled 2014-05-16 (×6): qty 1

## 2014-05-16 MED ORDER — HYDROMORPHONE HCL PF 1 MG/ML IJ SOLN
0.5000 mg | Freq: Once | INTRAMUSCULAR | Status: AC
  Administered 2014-05-16: 0.5 mg via INTRAVENOUS
  Filled 2014-05-16: qty 1

## 2014-05-16 MED ORDER — METOCLOPRAMIDE HCL 10 MG PO TABS: 10.0000 mg | ORAL_TABLET | Freq: Four times a day (QID) | ORAL | Status: DC | PRN

## 2014-05-16 MED ORDER — ALBUTEROL SULFATE (2.5 MG/3ML) 0.083% IN NEBU
2.5000 mg | INHALATION_SOLUTION | Freq: Once | RESPIRATORY_TRACT | Status: AC
  Administered 2014-05-16: 2.5 mg via RESPIRATORY_TRACT
  Filled 2014-05-16: qty 3

## 2014-05-16 MED ORDER — WARFARIN - PHARMACIST DOSING INPATIENT
Status: DC
  Administered 2014-05-17 – 2014-05-21 (×3)

## 2014-05-16 MED ORDER — HYDROMORPHONE HCL PF 1 MG/ML IJ SOLN
1.0000 mg | Freq: Once | INTRAMUSCULAR | Status: AC
  Administered 2014-05-16: 1 mg via INTRAVENOUS
  Filled 2014-05-16: qty 1

## 2014-05-16 MED ORDER — MOMETASONE FURO-FORMOTEROL FUM 100-5 MCG/ACT IN AERO
2.0000 | INHALATION_SPRAY | Freq: Two times a day (BID) | RESPIRATORY_TRACT | Status: DC
Start: 2014-05-16 — End: 2014-05-22
  Administered 2014-05-18 – 2014-05-22 (×6): 2 via RESPIRATORY_TRACT
  Filled 2014-05-16 (×29): qty 8.8

## 2014-05-16 MED ORDER — NITROGLYCERIN 2 % TD OINT: 0.5000 [in_us] | TOPICAL_OINTMENT | Freq: Four times a day (QID) | TRANSDERMAL | Status: DC

## 2014-05-16 MED ORDER — WARFARIN SODIUM 5 MG PO TABS
5.0000 mg | ORAL_TABLET | Freq: Once | ORAL | Status: AC
  Administered 2014-05-17: 5 mg via ORAL
  Filled 2014-05-16 (×2): qty 1

## 2014-05-16 MED ORDER — DOCUSATE SODIUM 100 MG PO CAPS
100.0000 mg | ORAL_CAPSULE | Freq: Two times a day (BID) | ORAL | Status: DC
  Administered 2014-05-17 – 2014-05-19 (×6): 100 mg via ORAL
  Filled 2014-05-16 (×10): qty 1

## 2014-05-16 MED ORDER — OXYCODONE HCL 5 MG PO TABS
5.0000 mg | ORAL_TABLET | ORAL | Status: DC | PRN
  Administered 2014-05-17: 5 mg via ORAL
  Filled 2014-05-16 (×9): qty 1

## 2014-05-16 MED ORDER — IPRATROPIUM-ALBUTEROL 0.5-2.5 (3) MG/3ML IN SOLN
RESPIRATORY_TRACT | Status: AC
  Filled 2014-05-16: qty 3

## 2014-05-16 MED ORDER — NITROGLYCERIN 2 % TD OINT
1.0000 [in_us] | TOPICAL_OINTMENT | Freq: Once | TRANSDERMAL | Status: DC
  Filled 2014-05-16: qty 1

## 2014-05-16 MED ORDER — OXYCODONE HCL 5 MG PO TABS
5.0000 mg | ORAL_TABLET | ORAL | Status: DC | PRN
  Administered 2014-05-17 – 2014-05-22 (×12): 5 mg via ORAL
  Filled 2014-05-16 (×5): qty 1

## 2014-05-16 MED ORDER — WARFARIN SODIUM 4 MG PO TABS: 4.0000 mg | ORAL_TABLET | Freq: Every day | ORAL | Status: DC

## 2014-05-16 NOTE — ED Notes (Signed)
Pt on home O2 at 2L/M

## 2014-05-16 NOTE — H&P (Signed)
[name] History and Physical  Kim [name]:[id number] DOB: 05-08-1966 DOA: 05/16/2014  Referring physician: ED physician, Dr. Estell Harpin PCP: Dorian Heckle, MD  Cardiologist, Dr. Mayme Genta in [place]  Chief Complaint: Shortness of breath  HPI: Kim [name] is a 48 y.o. female with a history of systolic congestive heart failure, atrial fibrillation on Coumadin, status post pacemaker, coronary artery disease, oxygen dependent COPD, and cirrhosis, who presents to the hospital today with a complaint of shortness of breath. Her shortness of breath started 2-3 days ago. It occurs when she lays flat and with ambulation. She is less short of breath at rest. She has also had central /substernal chest pain which is associated with shortness of breath. She's had some radiation of the pain to the left shoulder and nausea, but no diaphoresis. She denies associated palpitations. She denies abdominal swelling or an increase in swelling in her legs. She denies abdominal pain, vomiting, diarrhea, or pain with urination. She continues to smoke 5-6 cigarettes daily. She says that she wears her home oxygen most of the time. She is on 2-3 L of oxygen per minute. She denies missing taking any of her medicines although she has been in the process of moving from [facility] to [facility].  In the ED, she was afebrile and hemodynamically stable. She is currently chest pain-free. Her EKG revealed ventricular paced rhythm with a heart rate of 81 beats per minute. Her chest x-ray revealed a suspicion of congestive heart failure, cardiomegaly, and pulmonary venous hypertension. Her initial troponin I was 0.38 and was repeated at 0.40. Her pro BNP was 2906. She is being admitted for further evaluation and management.  Of note, ED physician, Dr. Estell Harpin had spoken to cardiology at [facility]. Apparently, Dr. Donnie Aho agreed to consult on the patient if she were to be transferred there. However, the  patient did not want to be transferred to [facility].    Review of Systems:  Her review of systems is grossly positive as above in history present illness. In addition, she has chronic headaches, chronic back pain, shortness of breath, chest pain, dizziness, nausea, tooth pain, anxiety. Otherwise review of systems is negative.  Past Medical History  Diagnosis Date  . Pacemaker     vent paced  . COPD (chronic obstructive pulmonary disease)   . CHF (congestive heart failure)   . Thyroid disease   . Hepatitis     Hep C  . MI (myocardial infarction)   . Pneumonia   . Dysrhythmia     atrial fibrilation  . Cirrhosis   . Shortness of breath   . Diabetes mellitus   . Chest pain   . GERD (gastroesophageal reflux disease)   . Headache(784.0)   . Anxiety   . Permanent atrial fibrillation   . Chronic pain   . PE (pulmonary embolism)   . On home O2     3L N/C  . Chronic anticoagulation   . History of medication noncompliance   . Chronic respiratory failure    Past Surgical History  Procedure Laterality Date  . Cholecystectomy    . Pacemaker insertion  02/2012    Biotronik PPM   . Cardiac electrophysiology study and ablation    . Esophagogastroduodenoscopy  Jan 2010    Dr. Arlyce Dice: multiple gastric erosions, benign path  . Esophagogastroduodenoscopy N/A 01/10/2014    [id number] EG junction. Query short segment Barrett's esophagus-status post pinch biopsy as described above. Hiatal hernia; otherwise negative EGD  .  Cardiac catheterization  07/2012    mild non-obstructive CAD   Social History: She is moving from [facility] to [facility]. She has one son. She is single. She smokes 5-6 cigarettes per day and has a 15-pack-year history. She stopped drinking 10 years ago after having a history of alcoholism. She denies illicit drug use. She is unemployed.    Allergies  Allergen Reactions  . Nitroglycerin Other (See Comments)    CAUSED NUMBNESS ALL OVER  .  Doxycycline Itching  . Flexeril [Cyclobenzaprine] Other (See Comments)    Sweating, Lightheaded   . Ibuprofen Other (See Comments)    Increase BP, dizziness, lightheaded  . Tramadol Itching  . Tylenol [Acetaminophen] Other (See Comments)    Pt has Hep C  . Vesicare [Solifenacin] Other (See Comments)    Makes my sweat turn yellow  . Amoxil [Amoxicillin] Nausea Only    Family History  Problem Relation Age of Onset  . Coronary artery disease Father 32  . Coronary artery disease Mother 23  . Coronary artery disease Brother   . Colon cancer Neg Hx      Prior to Admission medications   Medication Sig Start Date End Date Taking? Authorizing Provider  albuterol (PROVENTIL HFA;VENTOLIN HFA) 108 (90 BASE) MCG/ACT inhaler Inhale 2 puffs into the lungs every 6 (six) hours as needed for wheezing or shortness of breath.    Yes Historical Provider, MD  budesonide-formoterol (SYMBICORT) 80-4.5 MCG/ACT inhaler Inhale 2 puffs into the lungs 2 (two) times daily.   Yes Historical Provider, MD  furosemide (LASIX) 80 MG tablet Take 160 mg by mouth 2 (two) times daily.   Yes Historical Provider, MD  metoCLOPramide (REGLAN) 10 MG tablet Take 1 tablet (10 mg total) by mouth every 6 (six) hours as needed for nausea. 05/13/14  Yes Dione Booze, MD  metoprolol (LOPRESSOR) 50 MG tablet Take 25 mg by mouth 3 (three) times daily.   Yes Historical Provider, MD  omeprazole (PRILOSEC) 40 MG capsule Take 40 mg by mouth daily.   Yes Historical Provider, MD  oxyCODONE (OXY IR/ROXICODONE) 5 MG immediate release tablet Take 1 tablet (5 mg total) by mouth every 4 (four) hours as needed for severe pain. 05/13/14  Yes Dione Booze, MD  QUEtiapine (SEROQUEL) 25 MG tablet Take 50 mg by mouth 2 (two) times daily. 12/08/13  Yes Historical Provider, MD  tiotropium (SPIRIVA) 18 MCG inhalation capsule Place 18 mcg into inhaler and inhale daily.   Yes Historical Provider, MD  warfarin (COUMADIN) 4 MG tablet Take 4 mg by mouth at bedtime.    Yes Historical Provider, MD   Physical Exam: Filed Vitals:   05/16/14 2155  BP: 137/92  Pulse: 80  Temp: 98.3 F (36.8 C)  Resp: 15    BP 137/92  Pulse 80  Temp(Src) 98.3 F (36.8 C) (Oral)  Resp 15  SpO2 98%  LMP 02/08/2007  General:  Appears calm and comfortable; no acute distress. Eyes: PERRL, normal lids, irises & conjunctiva ENT: Very poor dentition with multiple caries and cavities; mucous membranes moist; no posterior pharyngeal exudates or erythema; grossly normal hearing. Neck: no LAD, masses or thyromegaly Cardiovascular: Irregular, irregular trace of LE edema. Telemetry: Ventricular paced rhythm  Respiratory: Scattered expiratory wheezes and crackles; breathing is nonlabored. Abdomen: Obese, positive bowel sounds, soft, ntnd Skin: no rash or induration seen on limited exam Musculoskeletal: grossly normal tone BUE/BLE Psychiatric: grossly normal mood and affect, speech fluent and appropriate Neurologic: grossly non-focal.cranial nerves II through XII  are intact. Strength is 5 over 5 throughout. Sensation is grossly intact.           Labs on Admission:  Basic Metabolic Panel:  Recent Labs Lab 05/13/14 0115 05/16/14 1523  NA 138 140  K 4.5 3.7  CL 101 100  CO2 27 31  GLUCOSE 93 84  BUN 8 5*  CREATININE 0.68 0.65  CALCIUM 8.2* 8.3*   Liver Function Tests:  Recent Labs Lab 05/13/14 0115  AST 43*  ALT 33  ALKPHOS 69  BILITOT 0.4  PROT 6.9  ALBUMIN 3.2*    Recent Labs Lab 05/13/14 0115  LIPASE 36   No results found for this basename: AMMONIA,  in the last 168 hours CBC:  Recent Labs Lab 05/13/14 0115 05/16/14 1523  WBC 7.1 6.7  NEUTROABS 4.1  --   HGB 12.7 12.7  HCT 37.8 38.0  MCV 101.1* 100.8*  PLT 173 171   Cardiac Enzymes:  Recent Labs Lab 05/16/14 1523 05/16/14 1721 05/16/14 2117  TROPONINI 0.38* 0.40* <0.30    BNP (last 3 results)  Recent Labs  02/03/14 0238 03/10/14 1758 05/16/14 1523  PROBNP 1995.0* 1022.0*  2906.0*   CBG: No results found for this basename: GLUCAP,  in the last 168 hours  Radiological Exams on Admission: Dg Chest Port 1 View  05/16/2014   CLINICAL DATA:  Shortness of Breath  EXAM: PORTABLE CHEST - 1 VIEW  COMPARISON:  chest radiograph and chest CT March 10, 2014  FINDINGS: The interstitium is borderline prominent; there may be trace edema. There is no airspace consolidation. Heart is mildly enlarged with mild pulmonary venous hypertension. Pacemaker leads are attached to the right atrium and right ventricle. No pneumothorax. No adenopathy.  IMPRESSION: Suspect a degree of congestive heart failure with trace edema as well as cardiomegaly and pulmonary venous hypertension. No consolidation.   Electronically Signed   By: Bretta Bang M.D.   On: 05/16/2014 15:13    EKG: Independently reviewed. As above in history present illness.  Assessment/Plan Principal Problem:   Acute on chronic systolic heart failure Active Problems:   Coronary atherosclerosis of native coronary artery   Elevated troponin I level   Chronic anticoagulation   COPD (chronic obstructive pulmonary disease)   Atrial fibrillation   Pacemaker   Hepatic cirrhosis due to chronic hepatitis C infection   Depression with anxiety   Tobacco abuse   Chronic respiratory failure   Poor dentition   1. This is a 48 year old woman who presents with symptomatology consistent with acute on chronic systolic heart failure. She has an impressive cardiac history with non-obstructive coronary artery disease, atrial fibrillation, and a ventricular pacemaker. Her elevated troponin I. may be a consequence of congestive heart failure rather than ACS. As I'm dictating, another followup troponin I. has resulted and is within normal limits. Her 2-D echocardiogram in January 2015 revealed an ejection fraction of 40-45% and with moderately reduced right ventricular systolic function. I highly suspect an element of noncompliance. Her INR  is subtherapeutic. She does not appear to be in significant respiratory or cardiac distress. I believe it is reasonable to keep the patient at [facility] for further evaluation and management and will ask cardiology to see her tomorrow morning.    Plan: 1. The patient was given IV Lasix in the emergency department. We will continue IV Lasix at 80 mg IV every 12 hours. 2. We'll continue her other chronic cardiac medications. 3. We'll add nitroglycerin paste every 6 hours.  4. Atrovent and albuterol nebulizers. Continue Dulera and oxygen for long-term treatment of oxygen-dependent COPD. 5. Will ask the pharmacist to dose Coumadin. We'll start Lovenox as a bridge. 6. The patient was strongly advised to stop smoking completely. 7. Consult cardiology tomorrow morning. 8. For further evaluation, we'll order another 2-D echocardiogram, cardiac enzymes, and TSH.     Code Status: Full code Family Communication: Family not available Disposition Plan: Discharged home when clinically appropriate.  Time spent: One hour and 10 minutes.  [facility] CenterFISHER,Corine Solorio [name] Pager [phone]  **Disclaimer: This note may have been dictated with voice recognition software. Similar sounding words can inadvertently be transcribed and this note may contain transcription errors which may not have been corrected upon publication of note.**

## 2014-05-16 NOTE — ED Notes (Signed)
Pt with SOB since yesterday evening, +nausea with mid CP since yesterday as well

## 2014-05-16 NOTE — ED Provider Notes (Signed)
CSN: [id number]     Arrival date & time 05/16/14  1403 History   First MD Initiated Contact with Patient 05/16/14 1459     Chief Complaint  Patient presents with  . Shortness of Breath     (Consider location/radiation/quality/duration/timing/severity/associated sxs/prior Treatment) Patient is a 48 y.o. female presenting with shortness of breath. The history is provided by the patient (the pt comlains of sob and chest pain).  Shortness of Breath Severity:  Moderate Onset quality:  Gradual Timing:  Intermittent Progression:  Unable to specify Chronicity:  Recurrent Context: activity   Associated symptoms: no abdominal pain, no chest pain, no cough, no headaches and no rash     Past Medical History  Diagnosis Date  . Pacemaker     vent paced  . COPD (chronic obstructive pulmonary disease)   . CHF (congestive heart failure)   . Thyroid disease   . Hepatitis     Hep C  . MI (myocardial infarction)   . Pneumonia   . Dysrhythmia     atrial fibrilation  . Cirrhosis   . Shortness of breath   . Diabetes mellitus   . Chest pain   . GERD (gastroesophageal reflux disease)   . Headache(784.0)   . Anxiety   . Permanent atrial fibrillation   . Chronic pain   . PE (pulmonary embolism)   . On home O2     3L N/C  . Chronic anticoagulation   . History of medication noncompliance    Past Surgical History  Procedure Laterality Date  . Cholecystectomy    . Pacemaker insertion  02/2012    Biotronik PPM   . Cardiac electrophysiology study and ablation    . Esophagogastroduodenoscopy  Jan 2010    Dr. Arlyce Dice: multiple gastric erosions, benign path  . Esophagogastroduodenoscopy N/A 01/10/2014    [id number] EG junction. Query short segment Barrett's esophagus-status post pinch biopsy as described above. Hiatal hernia; otherwise negative EGD  . Cardiac catheterization  07/2012    mild non-obstructive CAD   Family History  Problem Relation Age of Onset  . Coronary artery disease  Father 68  . Coronary artery disease Mother 32  . Coronary artery disease Brother   . Colon cancer Neg Hx    History  Substance Use Topics  . Smoking status: Current Every Day Smoker -- 0.50 packs/day for 30 years    Types: Cigarettes  . Smokeless tobacco: Current User    Types: Snuff  . Alcohol Use: No   OB History   Grav Para Term Preterm Abortions TAB SAB Ect Mult Living                 Review of Systems  Constitutional: Negative for appetite change and fatigue.  HENT: Negative for congestion, ear discharge and sinus pressure.   Eyes: Negative for discharge.  Respiratory: Positive for shortness of breath. Negative for cough.   Cardiovascular: Negative for chest pain.  Gastrointestinal: Negative for abdominal pain and diarrhea.  Genitourinary: Negative for frequency and hematuria.  Musculoskeletal: Negative for back pain.  Skin: Negative for rash.  Neurological: Negative for seizures and headaches.  Psychiatric/Behavioral: Negative for hallucinations.      Allergies  Nitroglycerin; Doxycycline; Flexeril; Ibuprofen; Tramadol; Tylenol; Vesicare; and Amoxil  Home Medications   Prior to Admission medications   Medication Sig Start Date End Date Taking? Authorizing Provider  albuterol (PROVENTIL HFA;VENTOLIN HFA) 108 (90 BASE) MCG/ACT inhaler Inhale 2 puffs into the lungs every 6 (six) hours as needed for  wheezing or shortness of breath.    Yes Historical Provider, MD  budesonide-formoterol (SYMBICORT) 80-4.5 MCG/ACT inhaler Inhale 2 puffs into the lungs 2 (two) times daily.   Yes Historical Provider, MD  furosemide (LASIX) 80 MG tablet Take 160 mg by mouth 2 (two) times daily.   Yes Historical Provider, MD  metoCLOPramide (REGLAN) 10 MG tablet Take 1 tablet (10 mg total) by mouth every 6 (six) hours as needed for nausea. 05/13/14  Yes Dione Boozeavid Glick, MD  metoprolol (LOPRESSOR) 50 MG tablet Take 25 mg by mouth 3 (three) times daily.   Yes Historical Provider, MD  omeprazole  (PRILOSEC) 40 MG capsule Take 40 mg by mouth daily.   Yes Historical Provider, MD  oxyCODONE (OXY IR/ROXICODONE) 5 MG immediate release tablet Take 1 tablet (5 mg total) by mouth every 4 (four) hours as needed for severe pain. 05/13/14  Yes Dione Boozeavid Glick, MD  QUEtiapine (SEROQUEL) 25 MG tablet Take 50 mg by mouth 2 (two) times daily. 12/08/13  Yes Historical Provider, MD  tiotropium (SPIRIVA) 18 MCG inhalation capsule Place 18 mcg into inhaler and inhale daily.   Yes Historical Provider, MD  warfarin (COUMADIN) 4 MG tablet Take 4 mg by mouth at bedtime.   Yes Historical Provider, MD   BP 137/98  Pulse 81  Temp(Src) 97.5 F (36.4 C) (Oral)  Resp 28  SpO2 100%  LMP 02/08/2007 Physical Exam  Constitutional: She is oriented to person, place, and time. She appears well-developed.  HENT:  Head: Normocephalic.  Eyes: Conjunctivae and EOM are normal. No scleral icterus.  Neck: Neck supple. No thyromegaly present.  Cardiovascular: Normal rate and regular rhythm.  Exam reveals no gallop and no friction rub.   No murmur heard. Pulmonary/Chest: No stridor. She has no wheezes. She has no rales. She exhibits no tenderness.  Abdominal: She exhibits no distension. There is no tenderness. There is no rebound.  Musculoskeletal: Normal range of motion. She exhibits no edema.  Lymphadenopathy:    She has no cervical adenopathy.  Neurological: She is oriented to person, place, and time. She exhibits normal muscle tone. Coordination normal.  Skin: No rash noted. No erythema.  Psychiatric: She has a normal mood and affect. Her behavior is normal.    ED Course  Procedures (including critical care time) Labs Review Labs Reviewed  CBC - Abnormal; Notable for the following:    RBC 3.77 (*)    MCV 100.8 (*)    All other components within normal limits  BASIC METABOLIC PANEL - Abnormal; Notable for the following:    BUN 5 (*)    Calcium 8.3 (*)    All other components within normal limits  TROPONIN I -  Abnormal; Notable for the following:    Troponin I 0.38 (*)    All other components within normal limits  PRO B NATRIURETIC PEPTIDE - Abnormal; Notable for the following:    Pro B Natriuretic peptide (BNP) 2906.0 (*)    All other components within normal limits  PROTIME-INR - Abnormal; Notable for the following:    Prothrombin Time 16.6 (*)    All other components within normal limits  TROPONIN I - Abnormal; Notable for the following:    Troponin I 0.40 (*)    All other components within normal limits    Imaging Review Dg Chest Port 1 View  05/16/2014   CLINICAL DATA:  Shortness of Breath  EXAM: PORTABLE CHEST - 1 VIEW  COMPARISON:  chest radiograph and chest CT March 10, 2014  FINDINGS: The interstitium is borderline prominent; there may be trace edema. There is no airspace consolidation. Heart is mildly enlarged with mild pulmonary venous hypertension. Pacemaker leads are attached to the right atrium and right ventricle. No pneumothorax. No adenopathy.  IMPRESSION: Suspect a degree of congestive heart failure with trace edema as well as cardiomegaly and pulmonary venous hypertension. No consolidation.   Electronically Signed   By: Bretta Bang M.D.   On: 05/16/2014 15:13     EKG Interpretation   Date/Time:  Wednesday May 16 2014 14:15:44 EDT Ventricular Rate:  81 PR Interval:    QRS Duration: 186 QT Interval:  486 QTC Calculation: 564 R Axis:   -81 Text Interpretation:  Ventricular-paced rhythm Abnormal ECG Confirmed by  Byrd Terrero  MD, Jomarie Longs [phone]) on 05/16/2014 7:49:54 PM      MDM   Final diagnoses:  CHF (congestive heart failure)    I spoke with cardiology and she will be admitted by [facility] and cardiology will consult   Benny Lennert, MD 05/16/14 1951

## 2014-05-16 NOTE — ED Notes (Signed)
Dr. Estell Harpin notified of troponin  0.38

## 2014-05-17 ENCOUNTER — Encounter (HOSPITAL_COMMUNITY): Payer: Self-pay | Admitting: Cardiology

## 2014-05-17 DIAGNOSIS — Z950 Presence of cardiac pacemaker: Secondary | ICD-10-CM

## 2014-05-17 DIAGNOSIS — I059 Rheumatic mitral valve disease, unspecified: Secondary | ICD-10-CM

## 2014-05-17 DIAGNOSIS — I4891 Unspecified atrial fibrillation: Secondary | ICD-10-CM

## 2014-05-17 DIAGNOSIS — I2510 Atherosclerotic heart disease of native coronary artery without angina pectoris: Secondary | ICD-10-CM

## 2014-05-17 DIAGNOSIS — F341 Dysthymic disorder: Secondary | ICD-10-CM

## 2014-05-17 DIAGNOSIS — J449 Chronic obstructive pulmonary disease, unspecified: Secondary | ICD-10-CM

## 2014-05-17 DIAGNOSIS — Z7901 Long term (current) use of anticoagulants: Secondary | ICD-10-CM

## 2014-05-17 DIAGNOSIS — F17200 Nicotine dependence, unspecified, uncomplicated: Secondary | ICD-10-CM

## 2014-05-17 DIAGNOSIS — R109 Unspecified abdominal pain: Secondary | ICD-10-CM

## 2014-05-17 DIAGNOSIS — R52 Pain, unspecified: Secondary | ICD-10-CM

## 2014-05-17 DIAGNOSIS — I5043 Acute on chronic combined systolic (congestive) and diastolic (congestive) heart failure: Principal | ICD-10-CM

## 2014-05-17 LAB — COMPREHENSIVE METABOLIC PANEL
ALT: 23 U/L (ref 0–35)
AST: 33 U/L (ref 0–37)
Albumin: 3 g/dL — ABNORMAL LOW (ref 3.5–5.2)
Alkaline Phosphatase: 66 U/L (ref 39–117)
BUN: 6 mg/dL (ref 6–23)
CALCIUM: 8.1 mg/dL — AB (ref 8.4–10.5)
CO2: 33 meq/L — AB (ref 19–32)
Chloride: 94 mEq/L — ABNORMAL LOW (ref 96–112)
Creatinine, Ser: 0.73 mg/dL (ref 0.50–1.10)
GLUCOSE: 113 mg/dL — AB (ref 70–99)
Potassium: 3.3 mEq/L — ABNORMAL LOW (ref 3.7–5.3)
Sodium: 137 mEq/L (ref 137–147)
TOTAL PROTEIN: 6.8 g/dL (ref 6.0–8.3)
Total Bilirubin: 0.9 mg/dL (ref 0.3–1.2)

## 2014-05-17 LAB — TROPONIN I
Troponin I: 0.32 ng/mL (ref <0.30)
Troponin I: 0.33 ng/mL (ref <0.30)

## 2014-05-17 LAB — CBC
HEMATOCRIT: 36.6 % (ref 36.0–46.0)
Hemoglobin: 12.3 g/dL (ref 12.0–15.0)
MCH: 34.2 pg — ABNORMAL HIGH (ref 26.0–34.0)
MCHC: 33.6 g/dL (ref 30.0–36.0)
MCV: 101.7 fL — ABNORMAL HIGH (ref 78.0–100.0)
Platelets: 154 10*3/uL (ref 150–400)
RBC: 3.6 MIL/uL — ABNORMAL LOW (ref 3.87–5.11)
RDW: 15 % (ref 11.5–15.5)
WBC: 5.4 10*3/uL (ref 4.0–10.5)

## 2014-05-17 LAB — PROTIME-INR
INR: 1.4 (ref 0.00–1.49)
PROTHROMBIN TIME: 16.8 s — AB (ref 11.6–15.2)

## 2014-05-17 LAB — TSH: TSH: 4.98 u[IU]/mL — ABNORMAL HIGH (ref 0.350–4.500)

## 2014-05-17 LAB — GLUCOSE, CAPILLARY: Glucose-Capillary: 93 mg/dL (ref 70–99)

## 2014-05-17 LAB — MRSA PCR SCREENING: MRSA BY PCR: POSITIVE — AB

## 2014-05-17 MED ORDER — MUPIROCIN 2 % EX OINT
1.0000 "application " | TOPICAL_OINTMENT | Freq: Two times a day (BID) | CUTANEOUS | Status: AC
  Administered 2014-05-17 – 2014-05-21 (×10): 1 via NASAL
  Filled 2014-05-17 (×3): qty 22

## 2014-05-17 MED ORDER — CHLORHEXIDINE GLUCONATE CLOTH 2 % EX PADS
6.0000 | MEDICATED_PAD | Freq: Every day | CUTANEOUS | Status: AC
  Administered 2014-05-17 – 2014-05-21 (×5): 6 via TOPICAL

## 2014-05-17 MED ORDER — MOMETASONE FURO-FORMOTEROL FUM 100-5 MCG/ACT IN AERO
INHALATION_SPRAY | RESPIRATORY_TRACT | Status: AC
  Filled 2014-05-17: qty 8.8

## 2014-05-17 MED ORDER — POTASSIUM CHLORIDE CRYS ER 20 MEQ PO TBCR
60.0000 meq | EXTENDED_RELEASE_TABLET | Freq: Four times a day (QID) | ORAL | Status: AC
  Administered 2014-05-17 (×2): 60 meq via ORAL
  Filled 2014-05-17 (×3): qty 3

## 2014-05-17 MED ORDER — BIOTENE DRY MOUTH MT LIQD
15.0000 mL | Freq: Two times a day (BID) | OROMUCOSAL | Status: DC
Start: 2014-05-17 — End: 2014-05-22
  Administered 2014-05-17 – 2014-05-22 (×8): 15 mL via OROMUCOSAL

## 2014-05-17 MED ORDER — WARFARIN SODIUM 5 MG PO TABS
5.0000 mg | ORAL_TABLET | Freq: Once | ORAL | Status: AC
  Administered 2014-05-17: 5 mg via ORAL
  Filled 2014-05-17: qty 1

## 2014-05-17 NOTE — Progress Notes (Signed)
Pt a/o.vss. Up with assistance.denies any distress. Report called to M.Bullins, Charity fundraiser. Pt to be transferred via wheelchair to room 333.

## 2014-05-17 NOTE — Progress Notes (Signed)
[name] PROGRESS NOTE   Kim DavenportSandra F [name] ZOX:[id number] DOB: 1966/07/06 DOA: 05/16/2014 PCP: Dorian HeckleGILLIAM, RYAN D, MD  HPI/Subjective: Feeling much better, she was was eating. Denies any chest pain.  Assessment/Plan: Principal Problem:   Acute on chronic systolic heart failure Active Problems:   COPD (chronic obstructive pulmonary disease)   Atrial fibrillation   Coronary atherosclerosis of native coronary artery   Pacemaker   Hepatic cirrhosis due to chronic hepatitis C infection   Depression with anxiety   Tobacco abuse   Elevated troponin I level   Chronic anticoagulation   Chronic respiratory failure   Poor dentition    Acute on chronic systolic CHF -Presented with shortness of breath and lower extremity swelling. -2-D echocardiogram on January 9 of 2015 showed ejection fraction of 40-45% with diffuse hypokinesis. -Patient started on diuresis with IV Lasix. -Cardiology consulted, continue fluids restriction and daily weights.  Elevated troponin -Minimally elevated troponin of 0.33, patient denies any chest pain. -Likely secondary to the heart strain from CHF, follow trend in a.m.  Atrial fibrillation -Heart rate is controlled with metoprolol 25 mg 3 times a day. -Patient supposed to be on Coumadin, her INR is 1.38. -Patient has history of noncompliance, cardiology please advise about new novel agents as replacement to Coumadin.  COPD -Patient continued to smoke, counseled extensively about her smoking habit. -No wheezing more than her baseline, no change in her sputum production recently.  Pacemaker  Hepatic cirrhosis due to chronic hepatitis C infection -Has hypoalbuminemia, but no coagulopathy, thrombocytopenia or hyperbilirubinemia. -seems to be very compensated hepatic cirrhosis.  Code Status: Full code Family Communication: Plan discussed with the patient. Disposition Plan: Remains  inpatient   Consultants:  Cardiology  Procedures:  None  Antibiotics:  None   Objective: Filed Vitals:   05/17/14 1200  BP:   Pulse:   Temp:   Resp: 18   No intake or output data in the 24 hours ending 05/17/14 1249 Filed Weights   05/17/14 0000 05/17/14 0500  Weight: 108 kg (238 lb 1.6 oz) 108.7 kg (239 lb 10.2 oz)    Exam: General: Alert and awake, oriented x3, not in any acute distress. HEENT: anicteric sclera, pupils reactive to light and accommodation, EOMI CVS: S1-S2 clear, no murmur rubs or gallops Chest: clear to auscultation bilaterally, no wheezing, rales or rhonchi Abdomen: soft nontender, nondistended, normal bowel sounds, no organomegaly Extremities: no cyanosis, clubbing or edema noted bilaterally Neuro: Cranial nerves II-XII intact, no focal neurological deficits  Data Reviewed: Basic Metabolic Panel:  Recent Labs Lab 05/13/14 0115 05/16/14 1523 05/17/14 0251  NA 138 140 137  K 4.5 3.7 3.3*  CL 101 100 94*  CO2 27 31 33*  GLUCOSE 93 84 113*  BUN 8 5* 6  CREATININE 0.68 0.65 0.73  CALCIUM 8.2* 8.3* 8.1*   Liver Function Tests:  Recent Labs Lab 05/13/14 0115 05/17/14 0251  AST 43* 33  ALT 33 23  ALKPHOS 69 66  BILITOT 0.4 0.9  PROT 6.9 6.8  ALBUMIN 3.2* 3.0*    Recent Labs Lab 05/13/14 0115  LIPASE 36   No results found for this basename: AMMONIA,  in the last 168 hours CBC:  Recent Labs Lab 05/13/14 0115 05/16/14 1523 05/17/14 0251  WBC 7.1 6.7 5.4  NEUTROABS 4.1  --   --   HGB 12.7 12.7 12.3  HCT 37.8 38.0 36.6  MCV 101.1* 100.8* 101.7*  PLT 173 171 154   Cardiac Enzymes:  Recent Labs Lab 05/16/14 1523  05/16/14 1721 05/16/14 2117 05/17/14 0251 05/17/14 0818  TROPONINI 0.38* 0.40* <0.30 0.32* 0.33*   BNP (last 3 results)  Recent Labs  02/03/14 0238 03/10/14 1758 05/16/14 1523  PROBNP 1995.0* 1022.0* 2906.0*   CBG:  Recent Labs Lab 05/17/14 0735  GLUCAP 93    Micro Recent Results (from  the past 240 hour(s))  MRSA PCR SCREENING     Status: Abnormal   Collection Time    05/16/14 10:55 PM      Result Value Ref Range Status   MRSA by PCR POSITIVE (*) NEGATIVE Final   Comment:            The GeneXpert MRSA Assay (FDA     approved for NASAL specimens     only), is one component of a     comprehensive MRSA colonization     surveillance program. It is not     intended to diagnose MRSA     infection nor to guide or     monitor treatment for     MRSA infections.     RESULT CALLED TO, READ BACK BY AND VERIFIED WITH:     HAMMOCK S AT [id number] ON [id number] BY FORSYTH K     Studies: Dg Chest Port 1 View  05/16/2014   CLINICAL DATA:  Shortness of Breath  EXAM: PORTABLE CHEST - 1 VIEW  COMPARISON:  chest radiograph and chest CT March 10, 2014  FINDINGS: The interstitium is borderline prominent; there may be trace edema. There is no airspace consolidation. Heart is mildly enlarged with mild pulmonary venous hypertension. Pacemaker leads are attached to the right atrium and right ventricle. No pneumothorax. No adenopathy.  IMPRESSION: Suspect a degree of congestive heart failure with trace edema as well as cardiomegaly and pulmonary venous hypertension. No consolidation.   Electronically Signed   By: Bretta Bang M.D.   On: 05/16/2014 15:13    Scheduled Meds: . antiseptic oral rinse  15 mL Mouth Rinse BID  . Chlorhexidine Gluconate Cloth  6 each Topical Q0600  . docusate sodium  100 mg Oral BID  . enoxaparin (LOVENOX) injection  100 mg Subcutaneous Q12H  . furosemide  80 mg Intravenous Q12H  . ipratropium-albuterol  3 mL Nebulization Q6H  . metoprolol  25 mg Oral TID  . mometasone-formoterol  2 puff Inhalation BID  . mupirocin ointment  1 application Nasal BID  . pantoprazole  40 mg Oral Daily  . QUEtiapine  50 mg Oral BID  . sodium chloride  3 mL Intravenous Q12H  . warfarin  5 mg Oral Once  . Warfarin - Pharmacist Dosing Inpatient   Does not apply Q24H   Continuous Infusions:       Time spent: 35 minutes    [facility]  [name] Pager [phone] If 7PM-7AM, please contact night-coverage at [email or web address], password [facility] 05/17/2014, 12:49 PM  LOS: 1 day

## 2014-05-17 NOTE — Progress Notes (Signed)
UR chart review completed.  

## 2014-05-17 NOTE — Consult Note (Signed)
CARDIOLOGY CONSULT NOTE   Patient ID: Kim [name] MRN: [id number] DOB/AGE: 01/03/1966 48 y.o.  Admit Date: 05/16/2014 Referring Physician: PTH Primary Physician: Dorian Heckle, MD Consulting Cardiologist: Nona Dell MD Primary Cardiologist : Previously Dr. Piedad Climes M.D. [facility] cardiology) - to establish with Dr. [name] Reason for Consultation: CHF  Clinical Summary Kim [name] is a 48 y.o.female with past medical history outlined below, current admitted with recurrent CHF, mildly elevated troponin I at 0.38 and 0.40 respectively. Pro-BNP was elevated at 2906.Marland Kitchen She has had frequent admissions and ER evaluation for recurrent chest pain and decompensated systolic heart failure. Was reportedly admitted to [facility] for CHF decompensation approximately 2 months ago.  She has been treated with nitroglycerin paste, Lasix 80 mg IV, and steroid nebulizer treatment. Most recent documentation of weight per hospital notes was in January of 2015 with a weight of 240 pounds.  Patient states that she is normally followed by Dr. Piedad Climes M.D. [facility] cardiology. She has seen him within the last 2 months during a hospitalization at [facility]. Admission was for decompensated CHF. She states that Dr. Mayme Genta also placed the Biotronik pacemaker. Review of identification card demonstrates that the pacemaker was placed in March of 2013. She states she has not had her pacemaker interrogated since implantation. She is also followed by [facility], with PT INR checks completed there. But admits she has not seen them in over a month as she has transportation issues.  The patient states symptoms began 3 days prior to admission with worsening lower extremity edema, poor urinary output, and increased dyspnea on exertion with PND. She admits to dietary noncompliance. The patient is recently moved to [facility] from [place]. She wishes to be established with a local cardiologist. It is noted that she has had a hospitalization at [facility] in January of 2015 where she was seen by Dr. [name] and will be established with him on followup.   Allergies  Allergen Reactions  . Nitroglycerin Other (See Comments)    CAUSED NUMBNESS ALL OVER  . Doxycycline Itching  . Flexeril [Cyclobenzaprine] Other (See Comments)    Sweating, Lightheaded   . Ibuprofen Other (See Comments)    Increase BP, dizziness, lightheaded  . Tramadol Itching  . Tylenol [Acetaminophen] Other (See Comments)    Pt has Hep C  . Vesicare [Solifenacin] Other (See Comments)    Makes my sweat turn yellow  . Amoxil [Amoxicillin] Nausea Only    Medications Scheduled Medications: . antiseptic oral rinse  15 mL Mouth Rinse BID  . Chlorhexidine Gluconate Cloth  6 each Topical Q0600  . docusate sodium  100 mg Oral BID  . enoxaparin (LOVENOX) injection  100 mg Subcutaneous Q12H  . furosemide  80 mg Intravenous Q12H  . ipratropium-albuterol  3 mL Nebulization Q6H  . ipratropium-albuterol      . metoprolol  25 mg Oral TID  . mometasone-formoterol  2 puff Inhalation BID  . mupirocin ointment  1 application Nasal BID  . pantoprazole  40 mg Oral Daily  . QUEtiapine  50 mg Oral BID  . sodium chloride  3 mL Intravenous Q12H  . warfarin  5 mg Oral Once  . Warfarin - Pharmacist Dosing Inpatient   Does not apply Q24H    PRN Medications: acetaminophen, acetaminophen, ALPRAZolam, metoCLOPramide, morphine injection, ondansetron (ZOFRAN) IV, ondansetron, oxyCODONE, oxyCODONE   Past Medical History  Diagnosis Date  .  COPD (chronic obstructive pulmonary disease)     Home O2 3L  . Coronary atherosclerosis of native coronary artery     Cardiac catheterization 07/2012 - LAD 40%; small D2 with 60 to 70% ostial; OM1 30 to 40%; RCA 20%; PL1 40%   . Hypothyroidism   . Hepatitis C   . Pneumonia   . Cirrhosis   . Type 2  diabetes mellitus   . GERD (gastroesophageal reflux disease)   . Headache(784.0)   . Anxiety   . Atrial fibrillation and flutter   . Chronic pain   . PE (pulmonary embolism)   . Chronic anticoagulation   . History of medication noncompliance   . SSS (sick sinus syndrome)     Biotronik pacemaker  . Cardiomyopathy     LVEF 40-45%    Past Surgical History  Procedure Laterality Date  . Cholecystectomy    . Pacemaker insertion  02/2012    Biotronik PPM   . Cardiac electrophysiology study and ablation    . Esophagogastroduodenoscopy  Jan 2010    Dr. Arlyce Dice: multiple gastric erosions, benign path  . Esophagogastroduodenoscopy N/A 01/10/2014    GMW:[id number] EG junction. Query short segment Barrett's esophagus-status post pinch biopsy as described above. Hiatal hernia; otherwise negative EGD    Family History  Problem Relation Age of Onset  . Coronary artery disease Father 1  . Coronary artery disease Mother 69  . Coronary artery disease Brother   . Colon cancer Neg Hx     Social History Kim [name] reports that she has been smoking Cigarettes.  She has a 15 pack-year smoking history. Her smokeless tobacco use includes Snuff. Kim [name] reports that she does not drink alcohol.  Review of Systems Otherwise reviewed and negative except as outlined.   Physical Examination Blood pressure 104/62, pulse 75, temperature 97.6 F (36.4 C), temperature source Oral, resp. rate 20, height 5\' 6"  (1.676 m), weight 239 lb 10.2 oz (108.7 kg), last menstrual period 02/08/2007, SpO2 94.00%. No intake or output data in the 24 hours ending 05/17/14 0952  Telemetry: Ventricularly paced.  GEN: No acute distress, breathing without oxygen support. HEENT: Conjunctiva and lids normal, oropharynx clear with moist mucosa. Neck: Supple, no elevated JVP or carotid bruits, no thyromegaly. Lungs: Essentially clear to auscultation, with some upper airway crackles, no wheezes are noted. Cardiac: Regular rate  and rhythm, distant heart sounds no S3 or significant systolic murmur, no pericardial rub. Abdomen: Soft, nontender, no hepatomegaly, bowel sounds present, no guarding or rebound. Extremities: 1+ to 2+ pitting edema, left greater than right distal pulses 2+. Skin: Warm and dry. Musculoskeletal: No kyphosis. Neuropsychiatric: Alert and oriented x3, affect grossly appropriate.   Prior Cardiac Testing/Procedures 1. Echocardiogram 12/2013 Left ventricle: The cavity size was normal. Wall thickness was increased in a pattern of mild LVH. Indeterminant diastolic function. Systolic function was mildly to moderately reduced. The estimated ejection fraction was in the range of 40% to 45%. Diffuse hypokinesis. Septal-lateral dyssynchrony (RV pacing). - Aortic valve: There was no stenosis. - Mitral valve: Mildly calcified annulus. Normal thickness leaflets . Trivial regurgitation. - Left atrium: The atrium was severely dilated. - Right ventricle: Pacer wire or catheter noted in right ventricle. Systolic function was mildly to moderately reduced. - Right atrium: The atrium was mildly dilated. - Pulmonary arteries: PA peak pressure: 16mm Hg (S). - Systemic veins: IVC measured 2.0 cm with < 50% respirophasic variation, suggesting RA pressure 8 mmHg. Impressions:  - Normal LV size with mild LV hypertrophy.  EF 40-45% with septal-lateral dyssynchrony (RV pacing) and global hypokinesis. Normal RV size with mild to moderately decreased systolic function.   2. Cardiac Cath 07/22/2012 Left main: Normal  LAD: Large vessel. There is a curly-Q in the mid-vessel which has associated calcification and about 40% stenosis. Mild plaque distally. 2 small diagonals In ostium of small D2 about 60-70% ostial stenosis.  LCX: Made up primarily of large OM-1 with 30-40% mid stenosis  RCA: Very large dominant vessel. 20% mid stenosis. PL1 with 40% proximal stenosis  LV-gram done in the RAO projection: Ejection  fraction = Not well opacified, EF ~40% with apical dyskinesis  Assessment:  1. Mild non-obstructive CAD  2. Apparent mild-moderate LV dysfunction with apical dyskinesis  Plan/Discussion:  Based on coronary angiography CP does not appear ischemic in nature. She does appear to have a least a mild degree of LV dysfunction. Would get echo to further evaluate. Can be discharged home in am with outpatient f/u from our standpoint.    Lab Results  Basic Metabolic Panel:  Recent Labs Lab 05/13/14 0115 05/16/14 1523 05/17/14 0251  NA 138 140 137  K 4.5 3.7 3.3*  CL 101 100 94*  CO2 27 31 33*  GLUCOSE 93 84 113*  BUN 8 5* 6  CREATININE 0.68 0.65 0.73  CALCIUM 8.2* 8.3* 8.1*    Liver Function Tests:  Recent Labs Lab 05/13/14 0115 05/17/14 0251  AST 43* 33  ALT 33 23  ALKPHOS 69 66  BILITOT 0.4 0.9  PROT 6.9 6.8  ALBUMIN 3.2* 3.0*    CBC:  Recent Labs Lab 05/13/14 0115 05/16/14 1523 05/17/14 0251  WBC 7.1 6.7 5.4  NEUTROABS 4.1  --   --   HGB 12.7 12.7 12.3  HCT 37.8 38.0 36.6  MCV 101.1* 100.8* 101.7*  PLT 173 171 154    Cardiac Enzymes:  Recent Labs Lab 05/16/14 1523 05/16/14 1721 05/16/14 2117 05/17/14 0251 05/17/14 0818  TROPONINI 0.38* 0.40* <0.30 0.32* 0.33*    Pro-BNP: 2906   Radiology: Dg Chest Port 1 View  05/16/2014   CLINICAL DATA:  Shortness of Breath  EXAM: PORTABLE CHEST - 1 VIEW  COMPARISON:  chest radiograph and chest CT March 10, 2014  FINDINGS: The interstitium is borderline prominent; there may be trace edema. There is no airspace consolidation. Heart is mildly enlarged with mild pulmonary venous hypertension. Pacemaker leads are attached to the right atrium and right ventricle. No pneumothorax. No adenopathy.  IMPRESSION: Suspect a degree of congestive heart failure with trace edema as well as cardiomegaly and pulmonary venous hypertension. No consolidation.   Electronically Signed   By: Bretta Bang M.D.   On: 05/16/2014 15:13     ECG: Ventricularly paced rhythm.   Impression and Recommendations  1. Acute on Chronic Systolic CHF: Decompensation likely from medical and dietary noncompliance. She has not been followed closely by her cardiologist as she is having transportation issues. She eats fast food alot. Has been having issues with fluid retention and shortness of breath for the last 3 days prior to admission. Uncertain of her dry weight. We will review Care Everywhere for documentation concerning her recent admission at [facility].  She will need to be reinforced a low-sodium diet, daily weights, and close followup with cardiology. She has recently moved to [facility], wishes to be established with our practice. As noted above she was followed by Dr. Wyline Mood during a hospitalization at [facility]  in January, and was reportedly to followup  in our office with him.   Would continue IV Lasix until fully compensated. Not repeat echocardiogram at this time as she is recently had one completed in January  2015, but will review Care Everywhere notes [facility] for records concerning repeat evaluation with echocardiogram at that time.  Mildly elevated troponins perhaps reflect demand ischemia, and have been noticed on prior evaluations based on record review. Most recent cardiac catheterization was completed by Dr. Gala RomneyBensimhon in 2013, demonstrating nonobstructive disease.  2. Atrial fibrillation: She has been followed concerning Coumadin dosing by [facility], but has not had INR checked in greater than 1 month due to transportation issues. She is subtherapeutic INR on admission at 1.3. Pharmacy is following for dosing. She is on home doses of metoprolol 25 mg 3 times a day, which have been continued on this hospitalization.   3. COPD: The patient unfortunately continues to smoke. She has chronic nebulizer treatments which she  states she is unable to get at home. Social services may need to become involved to assist her with home medications and maintenance of respiratory status.  4. Pacemaker in situ: This is a Biotronik pacemaker model [id number], implanted 27 2013 by Dr[facility]. She states it has not been interrogated release the year. Maybe longer. Consider interrogation during this hospitalization for documentation purposes.    Bettey MareKathryn M. Lawrence NP  05/17/2014, 9:52 AM Co-Sign MD   Attending note:  Patient seen and examined. Reviewed records and modified above note by Ms. Lawrence NP. Ms. Fortino Sicunn is a medically complex 48 year old woman presenting with shortness of breath and leg edema. She has had fragmented cardiology care over the years based on record review, also not consistently compliant with medications, diet, or followup. She is currently admitted for treatment of acute on chronic heart failure, possibly systolic or combined, although has also had some wheezing with known history of COPD ongoing tobacco abuse. Chest x-ray demonstrates enlarged cardiac silhouette to mild degree with mild pulmonary venous hypertension and trace pulmonary edema. Troponin I levels were minimally increased, as has been the case on prior evaluations, perhaps related to demand ischemia. Her most recent cardiac catheterization as noted above. Reported outpatient cardiac regimen is fairly limited. Plan at this time is to diurese with IV Lasix, obtain a followup echocardiogram, have her Biotronik pacemaker interrogated since this has not been done in several months by her account, and get Coumadin adjusted per pharmacy. Depending on blood pressure and heart rate control, further medication adjustments will also be pursued for more optimal management of cardiovascular disease. [place] has just recently moved to [place], and plans to establish care locally. We will get her establish with Dr. Wyline MoodBranch in the  office here, as discussed above.  Jonelle SidleSamuel G. McDowell, M.D., F.A.C.C.

## 2014-05-17 NOTE — Progress Notes (Signed)
*  PRELIMINARY RESULTS* Echocardiogram 2D Echocardiogram has been performed.  Kim [name] 05/17/2014, 3:46 PM

## 2014-05-17 NOTE — Progress Notes (Signed)
ANTICOAGULATION CONSULT NOTE - Initial Consult  Pharmacy Consult for Coumadin & Lovenox Indication: atrial fibrillation  Allergies  Allergen Reactions  . Nitroglycerin Other (See Comments)    CAUSED NUMBNESS ALL OVER  . Doxycycline Itching  . Flexeril [Cyclobenzaprine] Other (See Comments)    Sweating, Lightheaded   . Ibuprofen Other (See Comments)    Increase BP, dizziness, lightheaded  . Tramadol Itching  . Tylenol [Acetaminophen] Other (See Comments)    Pt has Hep C  . Vesicare [Solifenacin] Other (See Comments)    Makes my sweat turn yellow  . Amoxil [Amoxicillin] Nausea Only   Patient Measurements: Height: 5\' 6"  (167.6 cm) Weight: 239 lb 10.2 oz (108.7 kg) IBW/kg (Calculated) : 59.3  Vital Signs: Temp: 97.6 F (36.4 C) (06/11 0500) Temp src: Oral (06/11 0500) BP: 111/60 mmHg (06/11 0500) Pulse Rate: 75 (06/11 0500)  Labs:  Recent Labs  05/16/14 1523 05/16/14 1721 05/16/14 2117 05/17/14 0251  HGB 12.7  --   --  12.3  HCT 38.0  --   --  36.6  PLT 171  --   --  154  LABPROT 16.6*  --   --  16.8*  INR 1.38  --   --  1.40  CREATININE 0.65  --   --  0.73  TROPONINI 0.38* 0.40* <0.30 0.32*   Estimated Creatinine Clearance: 106.2 ml/min (by C-G formula based on Cr of 0.73).  Medical History: Past Medical History  Diagnosis Date  . Pacemaker     vent paced  . COPD (chronic obstructive pulmonary disease)   . CHF (congestive heart failure)   . Thyroid disease   . Hepatitis     Hep C  . MI (myocardial infarction)   . Pneumonia   . Dysrhythmia     atrial fibrilation  . Cirrhosis   . Shortness of breath   . Diabetes mellitus   . Chest pain   . GERD (gastroesophageal reflux disease)   . Headache(784.0)   . Anxiety   . Permanent atrial fibrillation   . Chronic pain   . PE (pulmonary embolism)   . On home O2     3L N/C  . Chronic anticoagulation   . History of medication noncompliance   . Chronic respiratory failure    Medications:  Prescriptions  prior to admission  Medication Sig Dispense Refill  . albuterol (PROVENTIL HFA;VENTOLIN HFA) 108 (90 BASE) MCG/ACT inhaler Inhale 2 puffs into the lungs every 6 (six) hours as needed for wheezing or shortness of breath.       . budesonide-formoterol (SYMBICORT) 80-4.5 MCG/ACT inhaler Inhale 2 puffs into the lungs 2 (two) times daily.      . furosemide (LASIX) 80 MG tablet Take 160 mg by mouth 2 (two) times daily.      . metoCLOPramide (REGLAN) 10 MG tablet Take 1 tablet (10 mg total) by mouth every 6 (six) hours as needed for nausea.  30 tablet  0  . metoprolol (LOPRESSOR) 50 MG tablet Take 25 mg by mouth 3 (three) times daily.      Marland Kitchen omeprazole (PRILOSEC) 40 MG capsule Take 40 mg by mouth daily.      Marland Kitchen oxyCODONE (OXY IR/ROXICODONE) 5 MG immediate release tablet Take 1 tablet (5 mg total) by mouth every 4 (four) hours as needed for severe pain.  12 tablet  0  . QUEtiapine (SEROQUEL) 25 MG tablet Take 50 mg by mouth 2 (two) times daily.      Marland Kitchen tiotropium (SPIRIVA)  18 MCG inhalation capsule Place 18 mcg into inhaler and inhale daily.      Marland Kitchen. warfarin (COUMADIN) 4 MG tablet Take 4 mg by mouth at bedtime.       Assessment: 47yo obese female on chronic Coumadin PTA for h/o afib.  Home dose is listed above.  INR is subtherapeutic on admission.  Asked to initiate Lovenox until INR therapeutic and manage Coumadin.  CBC stable, PLTs OK. Pt received Coumadin 5mg  last night on admission.  Goal of Therapy:  INR 2-3 Monitor platelets by anticoagulation protocol: Yes   Plan:   Coumadin 5mg  po today x 1 at 4pm  Lovenox 1mg /Kg SQ q12hrs until INR at goal  INR daily  Monitor CBC / Platelets  Yavuz Kirby A 05/17/2014,7:53 AM

## 2014-05-18 ENCOUNTER — Ambulatory Visit (INDEPENDENT_AMBULATORY_CARE_PROVIDER_SITE_OTHER): Payer: Medicaid Other | Admitting: *Deleted

## 2014-05-18 DIAGNOSIS — I5033 Acute on chronic diastolic (congestive) heart failure: Secondary | ICD-10-CM

## 2014-05-18 DIAGNOSIS — R079 Chest pain, unspecified: Secondary | ICD-10-CM

## 2014-05-18 DIAGNOSIS — I4891 Unspecified atrial fibrillation: Secondary | ICD-10-CM

## 2014-05-18 LAB — MDC_IDC_ENUM_SESS_TYPE_INCLINIC
Battery Remaining Longevity: 66 mo
Brady Statistic RV Percent Paced: 98 %
Lead Channel Impedance Value: 351 Ohm
Lead Channel Pacing Threshold Amplitude: 1.3 V
Lead Channel Pacing Threshold Pulse Width: 0.4 ms
Lead Channel Sensing Intrinsic Amplitude: 9.9 mV
Lead Channel Sensing Intrinsic Amplitude: 9.9 mV
Lead Channel Setting Pacing Amplitude: 2.4 V
Lead Channel Setting Pacing Pulse Width: 0.4 ms
MDC IDC MSMT LEADCHNL RV IMPEDANCE VALUE: 643 Ohm
MDC IDC MSMT LEADCHNL RV PACING THRESHOLD AMPLITUDE: 1.3 V
MDC IDC MSMT LEADCHNL RV PACING THRESHOLD PULSEWIDTH: 0.4 ms
MDC IDC PG SERIAL: 66278417
MDC IDC SESS DTM: 20150612120041

## 2014-05-18 LAB — BASIC METABOLIC PANEL
BUN: 9 mg/dL (ref 6–23)
CO2: 35 meq/L — AB (ref 19–32)
Calcium: 8.5 mg/dL (ref 8.4–10.5)
Chloride: 99 mEq/L (ref 96–112)
Creatinine, Ser: 0.91 mg/dL (ref 0.50–1.10)
GFR calc Af Amer: 84 mL/min — ABNORMAL LOW (ref >90)
GFR calc non Af Amer: 73 mL/min — ABNORMAL LOW (ref >90)
GLUCOSE: 98 mg/dL (ref 70–99)
Potassium: 4.4 mEq/L (ref 3.7–5.3)
SODIUM: 143 meq/L (ref 137–147)

## 2014-05-18 LAB — CBC
HCT: 41 % (ref 36.0–46.0)
HEMOGLOBIN: 13.5 g/dL (ref 12.0–15.0)
MCH: 33.8 pg (ref 26.0–34.0)
MCHC: 32.9 g/dL (ref 30.0–36.0)
MCV: 102.5 fL — AB (ref 78.0–100.0)
Platelets: 178 10*3/uL (ref 150–400)
RBC: 4 MIL/uL (ref 3.87–5.11)
RDW: 15.1 % (ref 11.5–15.5)
WBC: 5.6 10*3/uL (ref 4.0–10.5)

## 2014-05-18 LAB — MAGNESIUM: Magnesium: 1.6 mg/dL (ref 1.5–2.5)

## 2014-05-18 LAB — PROTIME-INR
INR: 1.72 — ABNORMAL HIGH (ref 0.00–1.49)
Prothrombin Time: 19.7 seconds — ABNORMAL HIGH (ref 11.6–15.2)

## 2014-05-18 LAB — TROPONIN I: Troponin I: <0.3 ng/mL (ref <0.30)

## 2014-05-18 MED ORDER — WARFARIN SODIUM 2 MG PO TABS
4.0000 mg | ORAL_TABLET | Freq: Once | ORAL | Status: AC
  Administered 2014-05-18: 4 mg via ORAL
  Filled 2014-05-18: qty 2

## 2014-05-18 MED ORDER — METHOCARBAMOL 500 MG PO TABS
500.0000 mg | ORAL_TABLET | Freq: Three times a day (TID) | ORAL | Status: DC
  Administered 2014-05-18 – 2014-05-22 (×12): 500 mg via ORAL
  Filled 2014-05-18 (×12): qty 1

## 2014-05-18 MED ORDER — MAGNESIUM SULFATE 40 MG/ML IJ SOLN
2.0000 g | Freq: Once | INTRAMUSCULAR | Status: AC
  Administered 2014-05-18: 2 g via INTRAVENOUS
  Filled 2014-05-18: qty 50

## 2014-05-18 NOTE — Progress Notes (Signed)
Patient crying and upset. Stated brother is at [facility] on a ventilator. Patient allowed to voice concerns. Xanax given at this time to help anxiety.

## 2014-05-18 NOTE — Progress Notes (Signed)
[name] PROGRESS NOTE   Kim DavenportSandra F Gombos ZOX:[id number] DOB: 1966-04-02 DOA: 05/16/2014 PCP: Dorian HeckleGILLIAM, RYAN D, MD  HPI/Subjective: Complains about some toothache and cramping "all over her body"  Assessment/Plan: Principal Problem:   Acute on chronic systolic heart failure Active Problems:   COPD (chronic obstructive pulmonary disease)   Atrial fibrillation   Coronary atherosclerosis of native coronary artery   Pacemaker   Hepatic cirrhosis due to chronic hepatitis C infection   Depression with anxiety   Tobacco abuse   Elevated troponin I level   Chronic anticoagulation   Chronic respiratory failure   Poor dentition   Acute on chronic diastolic heart failure    Acute on chronic systolic CHF -Presented with shortness of breath and lower extremity swelling. -2-D echocardiogram on January 9 of 2015 showed ejection fraction of 40-45% with diffuse hypokinesis. -Patient started on diuresis with IV Lasix. -Cardiology consulted, continue fluids restriction and daily weights.  Elevated troponin -Minimally elevated troponin of 0.33, patient denies any chest pain. -Likely secondary to the heart strain from CHF, repeat is normal this a.m.  Atrial fibrillation -Heart rate is controlled with metoprolol 25 mg 3 times a day. -Patient supposed to be on Coumadin, her INR is 1.38. -Patient has history of noncompliance, cardiology please advise about new novel agents as replacement to Coumadin.  COPD -Patient continued to smoke, counseled extensively about her smoking habit. -No wheezing more than her baseline, no change in her sputum production recently.  Pacemaker  Hepatic cirrhosis due to chronic hepatitis C infection -Has hypoalbuminemia, but no coagulopathy, thrombocytopenia or hyperbilirubinemia. -seems to be a compensated hepatic cirrhosis.  Code Status: Full code Family Communication: Plan discussed with the patient. Disposition Plan: Remains  inpatient   Consultants:  Cardiology  Procedures:  None  Antibiotics:  None   Objective: Filed Vitals:   05/18/14 0949  BP: 117/68  Pulse: 78  Temp:   Resp:     Intake/Output Summary (Last 24 hours) at 05/18/14 1326 Last data filed at 05/18/14 1205  Gross per 24 hour  Intake     60 ml  Output    800 ml  Net   -740 ml   Filed Weights   05/17/14 0000 05/17/14 0500 05/18/14 0500  Weight: 108 kg (238 lb 1.6 oz) 108.7 kg (239 lb 10.2 oz) 106.3 kg (234 lb 5.6 oz)    Exam: General: Alert and awake, oriented x3, not in any acute distress. HEENT: anicteric sclera, pupils reactive to light and accommodation, EOMI CVS: S1-S2 clear, no murmur rubs or gallops Chest: clear to auscultation bilaterally, no wheezing, rales or rhonchi Abdomen: soft nontender, nondistended, normal bowel sounds, no organomegaly Extremities: no cyanosis, clubbing or edema noted bilaterally Neuro: Cranial nerves II-XII intact, no focal neurological deficits  Data Reviewed: Basic Metabolic Panel:  Recent Labs Lab 05/13/14 0115 05/16/14 1523 05/17/14 0251 05/18/14 0545 05/18/14 0626  NA 138 140 137  --  143  K 4.5 3.7 3.3*  --  4.4  CL 101 100 94*  --  99  CO2 27 31 33*  --  35*  GLUCOSE 93 84 113*  --  98  BUN 8 5* 6  --  9  CREATININE 0.68 0.65 0.73  --  0.91  CALCIUM 8.2* 8.3* 8.1*  --  8.5  MG  --   --   --  1.6  --    Liver Function Tests:  Recent Labs Lab 05/13/14 0115 05/17/14 0251  AST 43* 33  ALT 33  23  ALKPHOS 69 66  BILITOT 0.4 0.9  PROT 6.9 6.8  ALBUMIN 3.2* 3.0*    Recent Labs Lab 05/13/14 0115  LIPASE 36   No results found for this basename: AMMONIA,  in the last 168 hours CBC:  Recent Labs Lab 05/13/14 0115 05/16/14 1523 05/17/14 0251 05/18/14 0626  WBC 7.1 6.7 5.4 5.6  NEUTROABS 4.1  --   --   --   HGB 12.7 12.7 12.3 13.5  HCT 37.8 38.0 36.6 41.0  MCV 101.1* 100.8* 101.7* 102.5*  PLT 173 171 154 178   Cardiac Enzymes:  Recent Labs Lab  05/16/14 1721 05/16/14 2117 05/17/14 0251 05/17/14 0818 05/18/14 0626  TROPONINI 0.40* <0.30 0.32* 0.33* <0.30   BNP (last 3 results)  Recent Labs  02/03/14 0238 03/10/14 1758 05/16/14 1523  PROBNP 1995.0* 1022.0* 2906.0*   CBG:  Recent Labs Lab 05/17/14 0735  GLUCAP 93    Micro Recent Results (from the past 240 hour(s))  MRSA PCR SCREENING     Status: Abnormal   Collection Time    05/16/14 10:55 PM      Result Value Ref Range Status   MRSA by PCR POSITIVE (*) NEGATIVE Final   Comment:            The GeneXpert MRSA Assay (FDA     approved for NASAL specimens     only), is one component of a     comprehensive MRSA colonization     surveillance program. It is not     intended to diagnose MRSA     infection nor to guide or     monitor treatment for     MRSA infections.     RESULT CALLED TO, READ BACK BY AND VERIFIED WITH:     HAMMOCK S AT 0243 ON [id number] BY FORSYTH K     Studies: Dg Chest Port 1 View  05/16/2014   CLINICAL DATA:  Shortness of Breath  EXAM: PORTABLE CHEST - 1 VIEW  COMPARISON:  chest radiograph and chest CT March 10, 2014  FINDINGS: The interstitium is borderline prominent; there may be trace edema. There is no airspace consolidation. Heart is mildly enlarged with mild pulmonary venous hypertension. Pacemaker leads are attached to the right atrium and right ventricle. No pneumothorax. No adenopathy.  IMPRESSION: Suspect a degree of congestive heart failure with trace edema as well as cardiomegaly and pulmonary venous hypertension. No consolidation.   Electronically Signed   By: Bretta Bang M.D.   On: 05/16/2014 15:13    Scheduled Meds: . antiseptic oral rinse  15 mL Mouth Rinse BID  . Chlorhexidine Gluconate Cloth  6 each Topical Q0600  . docusate sodium  100 mg Oral BID  . enoxaparin (LOVENOX) injection  100 mg Subcutaneous Q12H  . furosemide  80 mg Intravenous Q12H  . ipratropium-albuterol  3 mL Nebulization Q6H  . magnesium sulfate 1 - 4 g  bolus IVPB  2 g Intravenous Once  . methocarbamol  500 mg Oral TID  . metoprolol  25 mg Oral TID  . mometasone-formoterol  2 puff Inhalation BID  . mupirocin ointment  1 application Nasal BID  . pantoprazole  40 mg Oral Daily  . QUEtiapine  50 mg Oral BID  . sodium chloride  3 mL Intravenous Q12H  . warfarin  4 mg Oral Once  . Warfarin - Pharmacist Dosing Inpatient   Does not apply Q24H   Continuous Infusions:      Time spent: 35  minutes    [facility] A  [name] Pager [phone] If 7PM-7AM, please contact night-coverage at [email or web address], password [facility] 05/18/2014, 1:26 PM  LOS: 2 days

## 2014-05-18 NOTE — Progress Notes (Signed)
Patient complains of generalized muscle cramping. Physician notified. New medications ordered.

## 2014-05-18 NOTE — Progress Notes (Signed)
Consulting cardiologist: Nona DellMcDowell, Lilla Callejo MD Primary Cardiologist: To be sstablished with Dr. Wyline MoodBranch  Subjective:   Complaining of cramping "all over my body". Cannot tell if her breathing status is any better.   Objective:   Temp:  [97.6 F (36.4 C)-97.7 F (36.5 C)] 97.6 F (36.4 C) (06/12 0532) Pulse Rate:  [75-76] 75 (06/12 0532) Resp:  [18-23] 18 (06/12 0532) BP: (99-122)/(69-72) 99/69 mmHg (06/12 0532) SpO2:  [88 %-100 %] 97 % (06/12 0707) Weight:  [234 lb 5.6 oz (106.3 kg)] 234 lb 5.6 oz (106.3 kg) (06/12 0500) Last BM Date: 05/16/14  Filed Weights   05/17/14 0000 05/17/14 0500 05/18/14 0500  Weight: 238 lb 1.6 oz (108 kg) 239 lb 10.2 oz (108.7 kg) 234 lb 5.6 oz (106.3 kg)    Intake/Output Summary (Last 24 hours) at 05/18/14 0902 Last data filed at 05/18/14 0600  Gross per 24 hour  Intake     60 ml  Output    400 ml  Net   -340 ml    Telemetry: Normal sinus rhythm, with ventricular pacing. Heart rate in the 70s  Exam:  General: No acute distress.  Lungs: Some crackles are noted, but cleared with cough on deep inspiration  Cardiac: No elevated JVP or bruits. RRR, no gallop or rub.   Abdomen: Normoactive bowel sounds, nontender, nondistended.  Extremities: Mild pitting edema, distal pulses full.   Lab Results:  Basic Metabolic Panel:  Recent Labs Lab 05/16/14 1523 05/17/14 0251 05/18/14 0626  NA 140 137 143  K 3.7 3.3* 4.4  CL 100 94* 99  CO2 31 33* 35*  GLUCOSE 84 113* 98  BUN 5* 6 9  CREATININE 0.65 0.73 0.91  CALCIUM 8.3* 8.1* 8.5    Liver Function Tests:  Recent Labs Lab 05/13/14 0115 05/17/14 0251  AST 43* 33  ALT 33 23  ALKPHOS 69 66  BILITOT 0.4 0.9  PROT 6.9 6.8  ALBUMIN 3.2* 3.0*    CBC:  Recent Labs Lab 05/16/14 1523 05/17/14 0251 05/18/14 0626  WBC 6.7 5.4 5.6  HGB 12.7 12.3 13.5  HCT 38.0 36.6 41.0  MCV 100.8* 101.7* 102.5*  PLT 171 154 178    Cardiac Enzymes:  Recent Labs Lab 05/17/14 0251  05/17/14 0818 05/18/14 0626  TROPONINI 0.32* 0.33* <0.30    Radiology: Dg Chest Port 1 View  05/16/2014   CLINICAL DATA:  Shortness of Breath  EXAM: PORTABLE CHEST - 1 VIEW  COMPARISON:  chest radiograph and chest CT March 10, 2014  FINDINGS: The interstitium is borderline prominent; there may be trace edema. There is no airspace consolidation. Heart is mildly enlarged with mild pulmonary venous hypertension. Pacemaker leads are attached to the right atrium and right ventricle. No pneumothorax. No adenopathy.  IMPRESSION: Suspect a degree of congestive heart failure with trace edema as well as cardiomegaly and pulmonary venous hypertension. No consolidation.   Electronically Signed   By: Bretta BangWilliam  Woodruff M.D.   On: 05/16/2014 15:13    Echocardiogram (05/17/14) Study Conclusions  - Left ventricle: The cavity size was normal. There was mild focal basal hypertrophy of the septum. Systolic function was normal. The estimated ejection fraction was in the range of 55% to 60%. Features are consistent with a pseudonormal left ventricular filling pattern, with concomitant abnormal relaxation and increased filling pressure (grade 2 diastolic dysfunction). Doppler parameters are consistent with elevated mean left atrial filling pressure. - Ventricular septum: Septal motion showed abnormal function and dyssynergy. - Mitral valve: Calcified  annulus. There was mild regurgitation. - Left atrium: The atrium was moderately dilated. - Right ventricle: Pacer wire or catheter noted in right ventricle. Systolic function was mildly reduced. - Right atrium: Central venous pressure (est): 8 mm Hg. - Tricuspid valve: There was mild regurgitation. - Pulmonary arteries: Systolic pressure could not be accurately estimated. - Pericardium, extracardiac: A prominent pericardial fat pad was present. A trivial pericardial effusion was identified.  Impressions:  - Mild basal septal hypertrophy with LVEF 55-60%.  Paradoxical septal motion as well as mild apical hypokinesis with paradoxical motion possibly related to pacing. Grade 1 diastolic dysfunction with increased filling pressures. Moderate left atrial enlargement. MAC with mild mitral regurgitation. Device wire noted in right heart. Mildly reduced RV contraction. Unable to assess PASP. Prominent pericardial fat pad as well as trivial pericardial effusion noted.    Medications:   Scheduled Medications: . antiseptic oral rinse  15 mL Mouth Rinse BID  . Chlorhexidine Gluconate Cloth  6 each Topical Q0600  . docusate sodium  100 mg Oral BID  . enoxaparin (LOVENOX) injection  100 mg Subcutaneous Q12H  . furosemide  80 mg Intravenous Q12H  . ipratropium-albuterol  3 mL Nebulization Q6H  . metoprolol  25 mg Oral TID  . mometasone-formoterol  2 puff Inhalation BID  . mupirocin ointment  1 application Nasal BID  . pantoprazole  40 mg Oral Daily  . QUEtiapine  50 mg Oral BID  . sodium chloride  3 mL Intravenous Q12H  . Warfarin - Pharmacist Dosing Inpatient   Does not apply Q24H    PRN Medications: acetaminophen, acetaminophen, ALPRAZolam, metoCLOPramide, morphine injection, ondansetron (ZOFRAN) IV, ondansetron, oxyCODONE, oxyCODONE   Assessment and Plan:   1. Acute on Chronic DIastolic Heart Failure: Patient has diuresis approximately 5 pounds. She is not feeling better concerning her breathing currently, suspect concurrent pulmonary component to symptoms as well. Continues to cough. Uncertain of dry weight. Would continue IV diuresis for now then convert to oral regimen next 24-48 hours. LVEF is 55-60% with septal and apical dyssynergy (pacing?), grade 1 diastolic dysfunction, mildly reduced RV contraction. Continue beta blocker, no ACE-I or ARB as yet with low normal blood pressure.  2. Atrial Fibrillation: Heart rate is controlled. Pharmacy is following coumadin dosing.INR 1.72  3. COPD: Continues overall dyspnea, but is also anxious  this am with complaints of cramping. She has been advised on smoking cessation.  4. Pacemaker in situ: [employer]: Will have pacemaker interrogated today.   Bettey Mare. Lawrence NP  05/18/2014, 9:02 AM   Attending note:  Modified above note by Ms. Lawrence NP to reflect my findings. She continues to diurese on IV Lasix, echocardiogram from yesterday showing overall LVEF 55-60% with grade 1 diastolic dysfunction and mildly reduced RV contraction. Needs further IV diuresis, then simplify to oral regimen within the next 24-48 hours, continue beta blocker. Would hold off addition of ACE inhibitor or ARB with low normal blood pressure, may be able to be added later. Main issue seems to be noncompliance. Also suspect associated pulmonary component the symptoms with COPD, prolonged expiratory phase, active tobacco abuse. We do plan to have her pacemaker interrogated for baseline since she plans to establish with Dr. Wyline Mood.  Jonelle Sidle, M.D., F.A.C.C.

## 2014-05-18 NOTE — Progress Notes (Signed)
ANTICOAGULATION CONSULT NOTE - Initial Consult  Pharmacy Consult for Coumadin & Lovenox Indication: atrial fibrillation  Allergies  Allergen Reactions  . Nitroglycerin Other (See Comments)    CAUSED NUMBNESS ALL OVER  . Doxycycline Itching  . Flexeril [Cyclobenzaprine] Other (See Comments)    Sweating, Lightheaded   . Ibuprofen Other (See Comments)    Increase BP, dizziness, lightheaded  . Tramadol Itching  . Tylenol [Acetaminophen] Other (See Comments)    Pt has Hep C  . Vesicare [Solifenacin] Other (See Comments)    Makes my sweat turn yellow  . Amoxil [Amoxicillin] Nausea Only   Patient Measurements: Height: 5\' 6"  (167.6 cm) Weight: 234 lb 5.6 oz (106.3 kg) IBW/kg (Calculated) : 59.3  Vital Signs: Temp: 97.6 F (36.4 C) (06/12 0532) Temp src: Oral (06/12 0532) BP: 99/69 mmHg (06/12 0532) Pulse Rate: 75 (06/12 0532)  Labs:  Recent Labs  05/16/14 1523  05/17/14 0251 05/17/14 0818 05/18/14 0626  HGB 12.7  --  12.3  --  13.5  HCT 38.0  --  36.6  --  41.0  PLT 171  --  154  --  178  LABPROT 16.6*  --  16.8*  --  19.7*  INR 1.38  --  1.40  --  1.72*  CREATININE 0.65  --  0.73  --  0.91  TROPONINI 0.38*  < > 0.32* 0.33* <0.30  < > = values in this interval not displayed. Estimated Creatinine Clearance: 92.2 ml/min (by C-G formula based on Cr of 0.91).  Medical History: Past Medical History  Diagnosis Date  . COPD (chronic obstructive pulmonary disease)     Home O2 3L  . Coronary atherosclerosis of native coronary artery     Cardiac catheterization 07/2012 - LAD 40%; small D2 with 60 to 70% ostial; OM1 30 to 40%; RCA 20%; PL1 40%   . Hypothyroidism   . Hepatitis C   . Pneumonia   . Cirrhosis   . Type 2 diabetes mellitus   . GERD (gastroesophageal reflux disease)   . Headache(784.0)   . Anxiety   . Atrial fibrillation and flutter   . Chronic pain   . PE (pulmonary embolism)   . Chronic anticoagulation   . History of medication noncompliance   . SSS  (sick sinus syndrome)     Biotronik pacemaker  . Cardiomyopathy     LVEF 40-45%   Medications:  Prescriptions prior to admission  Medication Sig Dispense Refill  . albuterol (PROVENTIL HFA;VENTOLIN HFA) 108 (90 BASE) MCG/ACT inhaler Inhale 2 puffs into the lungs every 6 (six) hours as needed for wheezing or shortness of breath.       . budesonide-formoterol (SYMBICORT) 80-4.5 MCG/ACT inhaler Inhale 2 puffs into the lungs 2 (two) times daily.      . furosemide (LASIX) 80 MG tablet Take 160 mg by mouth 2 (two) times daily.      . metoCLOPramide (REGLAN) 10 MG tablet Take 1 tablet (10 mg total) by mouth every 6 (six) hours as needed for nausea.  30 tablet  0  . metoprolol (LOPRESSOR) 50 MG tablet Take 25 mg by mouth 3 (three) times daily.      Marland Kitchen omeprazole (PRILOSEC) 40 MG capsule Take 40 mg by mouth daily.      Marland Kitchen oxyCODONE (OXY IR/ROXICODONE) 5 MG immediate release tablet Take 1 tablet (5 mg total) by mouth every 4 (four) hours as needed for severe pain.  12 tablet  0  . QUEtiapine (SEROQUEL) 25 MG  tablet Take 50 mg by mouth 2 (two) times daily.      Marland Kitchen. tiotropium (SPIRIVA) 18 MCG inhalation capsule Place 18 mcg into inhaler and inhale daily.      Marland Kitchen. warfarin (COUMADIN) 4 MG tablet Take 4 mg by mouth at bedtime.       Assessment: 47yo obese female on chronic Coumadin PTA for h/o afib.  Home dose is listed above.  INR is subtherapeutic on admission.  Lovenox until INR therapeutic and managing Coumadin.  CBC stable, PLTs OK.  INR trending up.  Goal of Therapy:  INR 2-3 Monitor platelets by anticoagulation protocol: Yes   Plan:   Coumadin 4 mg po today x 1 at 4pm  Lovenox 1mg /Kg SQ q12hrs until INR at goal  INR daily  Monitor CBC / Platelets  Mady GemmaHayes, Cristy Colmenares R 05/18/2014,9:45 AM

## 2014-05-18 NOTE — Care Management Note (Addendum)
    Page 1 of 1   05/22/2014     12:57:34 PM CARE MANAGEMENT NOTE 05/22/2014  Patient:  Kim [name], Kim [name]   Account Number:  [id number]  Date Initiated:  05/18/2014  Documentation initiated by:  Rosemary Holms  Subjective/Objective Assessment:   Pt lives with spouse and son in [place]. Relocating to [place]. Needs PCP and HH RN     Action/Plan:   Anticipated DC Date:  05/22/2014   Anticipated DC Plan:  HOME W HOME HEALTH SERVICES      DC Planning Services  CM consult      [facility] Choice  HOME HEALTH   Choice offered to / List presented to:  C-1 Patient        HH arranged  HH-1 RN  HH-10 DISEASE MANAGEMENT      HH agency  [facility]   Status of service:  Completed, signed off Medicare Important Message given?   (If response is "NO", the following Medicare IM given date fields will be blank) Date Medicare IM given:   Date Additional Medicare IM given:    Discharge Disposition:    Per UR Regulation:    If discussed at Long Length of Stay Meetings, dates discussed:   05/22/2014    Comments:  05/18/14 Rosemary Holms RN CM Pt set up with Clara Gunn/[facility] for 05/28/14. [facility] for [facility] RN. Pt notified to call DSS to update her PCP on Medicaid card prior to appt on the 22nd.

## 2014-05-19 ENCOUNTER — Inpatient Hospital Stay (HOSPITAL_COMMUNITY): Payer: Medicaid Other

## 2014-05-19 DIAGNOSIS — B182 Chronic viral hepatitis C: Secondary | ICD-10-CM

## 2014-05-19 DIAGNOSIS — B1920 Unspecified viral hepatitis C without hepatic coma: Secondary | ICD-10-CM

## 2014-05-19 DIAGNOSIS — E876 Hypokalemia: Secondary | ICD-10-CM

## 2014-05-19 DIAGNOSIS — K7460 Unspecified cirrhosis of liver: Secondary | ICD-10-CM

## 2014-05-19 LAB — BASIC METABOLIC PANEL
BUN: 11 mg/dL (ref 6–23)
CHLORIDE: 89 meq/L — AB (ref 96–112)
CO2: 30 mEq/L (ref 19–32)
Calcium: 9 mg/dL (ref 8.4–10.5)
Creatinine, Ser: 0.84 mg/dL (ref 0.50–1.10)
GFR calc non Af Amer: 80 mL/min — ABNORMAL LOW (ref >90)
GLUCOSE: 91 mg/dL (ref 70–99)
POTASSIUM: 3.8 meq/L (ref 3.7–5.3)
Sodium: 135 mEq/L — ABNORMAL LOW (ref 137–147)

## 2014-05-19 LAB — MAGNESIUM: Magnesium: 1.8 mg/dL (ref 1.5–2.5)

## 2014-05-19 LAB — PROTIME-INR
INR: 2.28 — ABNORMAL HIGH (ref 0.00–1.49)
Prothrombin Time: 24.4 seconds — ABNORMAL HIGH (ref 11.6–15.2)

## 2014-05-19 MED ORDER — MAGNESIUM HYDROXIDE 400 MG/5ML PO SUSP
30.0000 mL | Freq: Once | ORAL | Status: DC
  Filled 2014-05-19: qty 30

## 2014-05-19 MED ORDER — WARFARIN SODIUM 2 MG PO TABS
4.0000 mg | ORAL_TABLET | Freq: Once | ORAL | Status: AC
  Administered 2014-05-19: 4 mg via ORAL
  Filled 2014-05-19: qty 2

## 2014-05-19 MED ORDER — MAGNESIUM SULFATE 40 MG/ML IJ SOLN
2.0000 g | Freq: Once | INTRAMUSCULAR | Status: AC
  Administered 2014-05-19: 2 g via INTRAVENOUS
  Filled 2014-05-19: qty 50

## 2014-05-19 MED ORDER — POLYETHYLENE GLYCOL 3350 17 G PO PACK
17.0000 g | PACK | Freq: Every day | ORAL | Status: DC
  Filled 2014-05-19 (×2): qty 1

## 2014-05-19 MED ORDER — POTASSIUM CHLORIDE CRYS ER 20 MEQ PO TBCR
40.0000 meq | EXTENDED_RELEASE_TABLET | Freq: Once | ORAL | Status: AC
  Administered 2014-05-19: 40 meq via ORAL
  Filled 2014-05-19: qty 2

## 2014-05-19 NOTE — Progress Notes (Signed)
ANTICOAGULATION CONSULT NOTE -  Pharmacy Consult for Coumadin & Lovenox Indication: atrial fibrillation  Allergies  Allergen Reactions  . Nitroglycerin Other (See Comments)    CAUSED NUMBNESS ALL OVER  . Doxycycline Itching  . Flexeril [Cyclobenzaprine] Other (See Comments)    Sweating, Lightheaded   . Ibuprofen Other (See Comments)    Increase BP, dizziness, lightheaded  . Tramadol Itching  . Tylenol [Acetaminophen] Other (See Comments)    Pt has Hep C  . Vesicare [Solifenacin] Other (See Comments)    Makes my sweat turn yellow  . Amoxil [Amoxicillin] Nausea Only   Patient Measurements: Height: 5\' 6"  (167.6 cm) Weight: 228 lb 2.8 oz (103.5 kg) IBW/kg (Calculated) : 59.3  Vital Signs: Temp: 97.5 F (36.4 C) (06/13 46960632) Temp src: Oral (06/13 29520632) BP: 118/77 mmHg (06/13 84130632) Pulse Rate: 77 (06/13 0632)  Labs:  Recent Labs  05/16/14 1523  05/17/14 0251 05/17/14 0818 05/18/14 0626 05/19/14 0643  HGB 12.7  --  12.3  --  13.5  --   HCT 38.0  --  36.6  --  41.0  --   PLT 171  --  154  --  178  --   LABPROT 16.6*  --  16.8*  --  19.7* 24.4*  INR 1.38  --  1.40  --  1.72* 2.28*  CREATININE 0.65  --  0.73  --  0.91 0.84  TROPONINI 0.38*  < > 0.32* 0.33* <0.30  --   < > = values in this interval not displayed. Estimated Creatinine Clearance: 98.5 ml/min (by C-G formula based on Cr of 0.84).  Medical History: Past Medical History  Diagnosis Date  . COPD (chronic obstructive pulmonary disease)     Home O2 3L  . Coronary atherosclerosis of native coronary artery     Cardiac catheterization 07/2012 - LAD 40%; small D2 with 60 to 70% ostial; OM1 30 to 40%; RCA 20%; PL1 40%   . Hypothyroidism   . Hepatitis C   . Pneumonia   . Cirrhosis   . Type 2 diabetes mellitus   . GERD (gastroesophageal reflux disease)   . Headache(784.0)   . Anxiety   . Atrial fibrillation and flutter   . Chronic pain   . PE (pulmonary embolism)   . Chronic anticoagulation   . History of  medication noncompliance   . SSS (sick sinus syndrome)     Biotronik pacemaker  . Cardiomyopathy     LVEF 40-45%   Medications:  Prescriptions prior to admission  Medication Sig Dispense Refill  . albuterol (PROVENTIL HFA;VENTOLIN HFA) 108 (90 BASE) MCG/ACT inhaler Inhale 2 puffs into the lungs every 6 (six) hours as needed for wheezing or shortness of breath.       . budesonide-formoterol (SYMBICORT) 80-4.5 MCG/ACT inhaler Inhale 2 puffs into the lungs 2 (two) times daily.      . furosemide (LASIX) 80 MG tablet Take 160 mg by mouth 2 (two) times daily.      . metoCLOPramide (REGLAN) 10 MG tablet Take 1 tablet (10 mg total) by mouth every 6 (six) hours as needed for nausea.  30 tablet  0  . metoprolol (LOPRESSOR) 50 MG tablet Take 25 mg by mouth 3 (three) times daily.      Marland Kitchen. omeprazole (PRILOSEC) 40 MG capsule Take 40 mg by mouth daily.      Marland Kitchen. oxyCODONE (OXY IR/ROXICODONE) 5 MG immediate release tablet Take 1 tablet (5 mg total) by mouth every 4 (four) hours as  needed for severe pain.  12 tablet  0  . QUEtiapine (SEROQUEL) 25 MG tablet Take 50 mg by mouth 2 (two) times daily.      Marland Kitchen tiotropium (SPIRIVA) 18 MCG inhalation capsule Place 18 mcg into inhaler and inhale daily.      Marland Kitchen warfarin (COUMADIN) 4 MG tablet Take 4 mg by mouth at bedtime.       Assessment: 48yo obese female on chronic Coumadin PTA for h/o afib.  Home dose is listed above.  INR is subtherapeutic on admission.  Lovenox until INR therapeutic and managing Coumadin.  CBC stable, PLTs OK.   INR therapeutic  Goal of Therapy:  INR 2-3 Monitor platelets by anticoagulation protocol: Yes   Plan:   Coumadin 4 mg po today x 1 at 4pm  Discontinue Lovenox (INR therapeutic)  INR daily  Monitor CBC / Platelets  Kim [name], Dessiree Sze Bennett 05/19/2014,10:04 AM

## 2014-05-19 NOTE — Progress Notes (Signed)
[name] PROGRESS NOTE   Kim DavenportSandra F [name] UJW:[id number] DOB: 06-25-1966 DOA: 05/16/2014 PCP: Dorian HeckleGILLIAM, Kim D, MD  HPI/Subjective: Having some abdominal pain, patient has very different complaints every day.  Assessment/Plan: Principal Problem:   Acute on chronic systolic heart failure Active Problems:   COPD (chronic obstructive pulmonary disease)   Atrial fibrillation   Coronary atherosclerosis of native coronary artery   Pacemaker   Hepatic cirrhosis due to chronic hepatitis C infection   Depression with anxiety   Tobacco abuse   Elevated troponin I level   Chronic anticoagulation   Chronic respiratory failure   Poor dentition   Acute on chronic diastolic heart failure    Acute on chronic systolic CHF -Presented with shortness of breath and lower extremity swelling. -2-D echocardiogram on January 9 of 2015 showed ejection fraction of 40-45% with diffuse hypokinesis. -Patient started on diuresis with IV Lasix. -Cardiology consulted, continue fluids restriction and daily weights.  Elevated troponin -Minimally elevated troponin of 0.33, patient denies any chest pain. -Likely secondary to the heart strain from CHF, repeat is normal this a.m.  Atrial fibrillation -Heart rate is controlled with metoprolol 25 mg 3 times a day. -Patient supposed to be on Coumadin, her INR is 1.38. -Patient has history of noncompliance, cardiology please advise about new novel agents as replacement to Coumadin.  COPD -Patient continued to smoke, counseled extensively about her smoking habit. -No wheezing more than her baseline, no change in her sputum production recently.  Pacemaker  Hepatic cirrhosis due to chronic hepatitis C infection -Has hypoalbuminemia, but no coagulopathy, thrombocytopenia or hyperbilirubinemia. -seems to be a compensated hepatic cirrhosis.  Abdominal pain -Left lower quadrant abdominal pain, check abdominal x-ray. -Add laxative.  Code Status: Full  code Family Communication: Plan discussed with the patient. Disposition Plan: Remains inpatient   Consultants:  Cardiology  Procedures:  None  Antibiotics:  None   Objective: Filed Vitals:   05/19/14 0632  BP: 118/77  Pulse: 77  Temp: 97.5 F (36.4 C)  Resp: 20    Intake/Output Summary (Last 24 hours) at 05/19/14 1036 Last data filed at 05/19/14 0950  Gross per 24 hour  Intake    963 ml  Output   1050 ml  Net    -87 ml   Filed Weights   05/17/14 0500 05/18/14 0500 05/19/14 0557  Weight: 108.7 kg (239 lb 10.2 oz) 106.3 kg (234 lb 5.6 oz) 103.5 kg (228 lb 2.8 oz)    Exam: General: Alert and awake, oriented x3, not in any acute distress. HEENT: anicteric sclera, pupils reactive to light and accommodation, EOMI CVS: S1-S2 clear, no murmur rubs or gallops Chest: clear to auscultation bilaterally, no wheezing, rales or rhonchi Abdomen: soft nontender, nondistended, normal bowel sounds, no organomegaly Extremities: no cyanosis, clubbing or edema noted bilaterally Neuro: Cranial nerves II-XII intact, no focal neurological deficits  Data Reviewed: Basic Metabolic Panel:  Recent Labs Lab 05/13/14 0115 05/16/14 1523 05/17/14 0251 05/18/14 0545 05/18/14 0626 05/19/14 0643  NA 138 140 137  --  143 135*  K 4.5 3.7 3.3*  --  4.4 3.8  CL 101 100 94*  --  99 89*  CO2 27 31 33*  --  35* 30  GLUCOSE 93 84 113*  --  98 91  BUN 8 5* 6  --  9 11  CREATININE 0.68 0.65 0.73  --  0.91 0.84  CALCIUM 8.2* 8.3* 8.1*  --  8.5 9.0  MG  --   --   --  1.6  --  1.8   Liver Function Tests:  Recent Labs Lab 05/13/14 0115 05/17/14 0251  AST 43* 33  ALT 33 23  ALKPHOS 69 66  BILITOT 0.4 0.9  PROT 6.9 6.8  ALBUMIN 3.2* 3.0*    Recent Labs Lab 05/13/14 0115  LIPASE 36   No results found for this basename: AMMONIA,  in the last 168 hours CBC:  Recent Labs Lab 05/13/14 0115 05/16/14 1523 05/17/14 0251 05/18/14 0626  WBC 7.1 6.7 5.4 5.6  NEUTROABS 4.1  --   --    --   HGB 12.7 12.7 12.3 13.5  HCT 37.8 38.0 36.6 41.0  MCV 101.1* 100.8* 101.7* 102.5*  PLT 173 171 154 178   Cardiac Enzymes:  Recent Labs Lab 05/16/14 1721 05/16/14 2117 05/17/14 0251 05/17/14 0818 05/18/14 0626  TROPONINI 0.40* <0.30 0.32* 0.33* <0.30   BNP (last 3 results)  Recent Labs  02/03/14 0238 03/10/14 1758 05/16/14 1523  PROBNP 1995.0* 1022.0* 2906.0*   CBG:  Recent Labs Lab 05/17/14 0735  GLUCAP 93    Micro Recent Results (from the past 240 hour(s))  MRSA PCR SCREENING     Status: Abnormal   Collection Time    05/16/14 10:55 PM      Result Value Ref Range Status   MRSA by PCR POSITIVE (*) NEGATIVE Final   Comment:            The GeneXpert MRSA Assay (FDA     approved for NASAL specimens     only), is one component of a     comprehensive MRSA colonization     surveillance program. It is not     intended to diagnose MRSA     infection nor to guide or     monitor treatment for     MRSA infections.     RESULT CALLED TO, READ BACK BY AND VERIFIED WITH:     HAMMOCK S AT 0243 ON [id number] BY FORSYTH K     Studies: No results found.  Scheduled Meds: . antiseptic oral rinse  15 mL Mouth Rinse BID  . Chlorhexidine Gluconate Cloth  6 each Topical Q0600  . docusate sodium  100 mg Oral BID  . furosemide  80 mg Intravenous Q12H  . ipratropium-albuterol  3 mL Nebulization Q6H  . methocarbamol  500 mg Oral TID  . metoprolol  25 mg Oral TID  . mometasone-formoterol  2 puff Inhalation BID  . mupirocin ointment  1 application Nasal BID  . pantoprazole  40 mg Oral Daily  . QUEtiapine  50 mg Oral BID  . sodium chloride  3 mL Intravenous Q12H  . warfarin  4 mg Oral Once  . Warfarin - Pharmacist Dosing Inpatient   Does not apply Q24H   Continuous Infusions:      Time spent: 35 minutes    [facility] A  [name] Pager [phone] If 7PM-7AM, please contact night-coverage at [email or web address], password [facility] 05/19/2014, 10:36 AM  LOS: 3 days

## 2014-05-19 NOTE — Progress Notes (Signed)
Patient voiced concerns about her home life and says she is depressed and would like to talk to a psychiatrist before being discharged, Dr. Arthor Captain paged with patient's concerns.

## 2014-05-20 DIAGNOSIS — I5022 Chronic systolic (congestive) heart failure: Secondary | ICD-10-CM

## 2014-05-20 DIAGNOSIS — R002 Palpitations: Secondary | ICD-10-CM

## 2014-05-20 LAB — CBC
HCT: 42.7 % (ref 36.0–46.0)
Hemoglobin: 14.4 g/dL (ref 12.0–15.0)
MCH: 34 pg (ref 26.0–34.0)
MCHC: 33.7 g/dL (ref 30.0–36.0)
MCV: 100.7 fL — AB (ref 78.0–100.0)
Platelets: 197 10*3/uL (ref 150–400)
RBC: 4.24 MIL/uL (ref 3.87–5.11)
RDW: 14.8 % (ref 11.5–15.5)
WBC: 7.1 10*3/uL (ref 4.0–10.5)

## 2014-05-20 LAB — BASIC METABOLIC PANEL
BUN: 15 mg/dL (ref 6–23)
CO2: 35 mEq/L — ABNORMAL HIGH (ref 19–32)
Calcium: 8.7 mg/dL (ref 8.4–10.5)
Chloride: 91 mEq/L — ABNORMAL LOW (ref 96–112)
Creatinine, Ser: 0.82 mg/dL (ref 0.50–1.10)
GFR calc Af Amer: >90 mL/min (ref >90)
GFR calc non Af Amer: 83 mL/min — ABNORMAL LOW (ref >90)
GLUCOSE: 103 mg/dL — AB (ref 70–99)
POTASSIUM: 3.9 meq/L (ref 3.7–5.3)
Sodium: 137 mEq/L (ref 137–147)

## 2014-05-20 LAB — MAGNESIUM: Magnesium: 1.7 mg/dL (ref 1.5–2.5)

## 2014-05-20 LAB — PROTIME-INR
INR: 2.16 — ABNORMAL HIGH (ref 0.00–1.49)
Prothrombin Time: 23.4 seconds — ABNORMAL HIGH (ref 11.6–15.2)

## 2014-05-20 MED ORDER — MAGNESIUM HYDROXIDE 400 MG/5ML PO SUSP
30.0000 mL | Freq: Once | ORAL | Status: DC
  Filled 2014-05-20: qty 30

## 2014-05-20 MED ORDER — WARFARIN SODIUM 5 MG PO TABS
5.0000 mg | ORAL_TABLET | Freq: Once | ORAL | Status: AC
  Administered 2014-05-20: 5 mg via ORAL
  Filled 2014-05-20: qty 1

## 2014-05-20 MED ORDER — METOPROLOL TARTRATE 25 MG PO TABS
25.0000 mg | ORAL_TABLET | Freq: Two times a day (BID) | ORAL | Status: DC
  Administered 2014-05-20 – 2014-05-22 (×4): 25 mg via ORAL
  Filled 2014-05-20 (×4): qty 1

## 2014-05-20 MED ORDER — POLYETHYLENE GLYCOL 3350 17 G PO PACK
17.0000 g | PACK | Freq: Every day | ORAL | Status: DC
  Administered 2014-05-20 – 2014-05-21 (×2): 17 g via ORAL
  Filled 2014-05-20 (×2): qty 1

## 2014-05-20 NOTE — Progress Notes (Signed)
[name] PROGRESS NOTE   Kim [name] ZOX:[id number] DOB: [birth year] DOA: 05/16/2014 PCP: Dorian Heckle, MD  HPI/Subjective: Constipated, last bowel movement was 6 days ago. Complaining about some abdominal discomfort., Shortness of breath resolved.  Assessment/Plan: Principal Problem:   Acute on chronic systolic heart failure Active Problems:   COPD (chronic obstructive pulmonary disease)   Atrial fibrillation   Coronary atherosclerosis of native coronary artery   Pacemaker   Hepatic cirrhosis due to chronic hepatitis C infection   Depression with anxiety   Tobacco abuse   Elevated troponin I level   Chronic anticoagulation   Chronic respiratory failure   Poor dentition   Acute on chronic diastolic heart failure    Acute on chronic systolic and diastolic CHF -Presented with shortness of breath and lower extremity swelling. -2-D echocardiogram on January 9 of 2015 showed ejection fraction of 40-45% with diffuse hypokinesis. -Repeat 2-D echo showed LVEF of 55-60% with grade 2 diastolic dysfunction. -Patient started on diuresis with IV Lasix. -Cardiology consulted, continue fluids restriction and daily weights. -Ambulates patient.  Elevated troponin -Minimally elevated troponin of 0.33, patient denies any chest pain. -Likely secondary to the heart strain from CHF, repeat is normal this a.m.  Atrial fibrillation -Heart rate is controlled with metoprolol 25 mg 3 times a day. -Patient supposed to be on Coumadin, her INR is 1.38. -INR is therapeutic, heart rate is in the use 70s  COPD -Patient continued to smoke, counseled extensively about her smoking habit. -No wheezing more than her baseline, no change in her sputum production recently.  Pacemaker  Hepatic cirrhosis due to chronic hepatitis C infection -Has hypoalbuminemia, but no coagulopathy, thrombocytopenia or hyperbilirubinemia. -seems to be a compensated hepatic cirrhosis.  Abdominal  pain -Left lower quadrant abdominal pain, abdominal x-ray showed no acute abnormalities. -Add laxative.  Code Status: Full code Family Communication: Plan discussed with the patient. Disposition Plan: Remains inpatient   Consultants:  Cardiology  Procedures:  None  Antibiotics:  None   Objective: Filed Vitals:   05/20/14 0420  BP: 120/71  Pulse: 74  Temp: 97.7 F (36.5 C)  Resp: 20    Intake/Output Summary (Last 24 hours) at 05/20/14 1216 Last data filed at 05/20/14 0437  Gross per 24 hour  Intake    720 ml  Output    350 ml  Net    370 ml   Filed Weights   05/18/14 0500 05/19/14 0557 05/20/14 0420  Weight: 106.3 kg (234 lb 5.6 oz) 103.5 kg (228 lb 2.8 oz) 104.2 kg (229 lb 11.5 oz)    Exam: General: Alert and awake, oriented x3, not in any acute distress. HEENT: anicteric sclera, pupils reactive to light and accommodation, EOMI CVS: S1-S2 clear, no murmur rubs or gallops Chest: clear to auscultation bilaterally, no wheezing, rales or rhonchi Abdomen: soft nontender, nondistended, normal bowel sounds, no organomegaly Extremities: no cyanosis, clubbing or edema noted bilaterally Neuro: Cranial nerves II-XII intact, no focal neurological deficits  Data Reviewed: Basic Metabolic Panel:  Recent Labs Lab 05/16/14 1523 05/17/14 0251 05/18/14 0545 05/18/14 0626 05/19/14 0643 05/20/14 0525  NA 140 137  --  143 135* 137  K 3.7 3.3*  --  4.4 3.8 3.9  CL 100 94*  --  99 89* 91*  CO2 31 33*  --  35* 30 35*  GLUCOSE 84 113*  --  98 91 103*  BUN 5* 6  --  9 11 15   CREATININE 0.65 0.73  --  0.91 0.84  0.82  CALCIUM 8.3* 8.1*  --  8.5 9.0 8.7  MG  --   --  1.6  --  1.8 1.7   Liver Function Tests:  Recent Labs Lab 05/17/14 0251  AST 33  ALT 23  ALKPHOS 66  BILITOT 0.9  PROT 6.8  ALBUMIN 3.0*   No results found for this basename: LIPASE, AMYLASE,  in the last 168 hours No results found for this basename: AMMONIA,  in the last 168  hours CBC:  Recent Labs Lab 05/16/14 1523 05/17/14 0251 05/18/14 0626 05/20/14 0525  WBC 6.7 5.4 5.6 7.1  HGB 12.7 12.3 13.5 14.4  HCT 38.0 36.6 41.0 42.7  MCV 100.8* 101.7* 102.5* 100.7*  PLT 171 154 178 197   Cardiac Enzymes:  Recent Labs Lab 05/16/14 1721 05/16/14 2117 05/17/14 0251 05/17/14 0818 05/18/14 0626  TROPONINI 0.40* <0.30 0.32* 0.33* <0.30   BNP (last 3 results)  Recent Labs  02/03/14 0238 03/10/14 1758 05/16/14 1523  PROBNP 1995.0* 1022.0* 2906.0*   CBG:  Recent Labs Lab 05/17/14 0735  GLUCAP 93    Micro Recent Results (from the past 240 hour(s))  MRSA PCR SCREENING     Status: Abnormal   Collection Time    05/16/14 10:55 PM      Result Value Ref Range Status   MRSA by PCR POSITIVE (*) NEGATIVE Final   Comment:            The GeneXpert MRSA Assay (FDA     approved for NASAL specimens     only), is one component of a     comprehensive MRSA colonization     surveillance program. It is not     intended to diagnose MRSA     infection nor to guide or     monitor treatment for     MRSA infections.     RESULT CALLED TO, READ BACK BY AND VERIFIED WITH:     HAMMOCK S AT 0243 ON [id number] BY FORSYTH K     Studies: Dg Abd 2 Views  05/19/2014   CLINICAL DATA:  Pain.  EXAM: ABDOMEN - 2 VIEW  COMPARISON:  CT 01/09/2014  FINDINGS: Soft tissue structures are unremarkable. Surgical clips right upper quadrant consistent prior cholecystectomy. Gas pattern is nonspecific. No bowel distention. No free air. Right base atelectasis and/or infiltrate with right-sided pleural effusion noted. Cardiac pacer. Cardiomegaly.  IMPRESSION: 1. No evidence of bowel distention or free air. Prior cholecystectomy. 2. Right lower lobe atelectasis and/or infiltrate with right-sided pleural effusion . 3. Cardiomegaly. Cardiac pacer with lead tips in the right atrium right ventricle.   Electronically Signed   By: Maisie Fushomas  Register   On: 05/19/2014 11:44    Scheduled Meds: .  antiseptic oral rinse  15 mL Mouth Rinse BID  . Chlorhexidine Gluconate Cloth  6 each Topical Q0600  . furosemide  80 mg Intravenous Q12H  . ipratropium-albuterol  3 mL Nebulization Q6H  . magnesium hydroxide  30 mL Oral Once  . methocarbamol  500 mg Oral TID  . metoprolol  25 mg Oral TID  . mometasone-formoterol  2 puff Inhalation BID  . mupirocin ointment  1 application Nasal BID  . pantoprazole  40 mg Oral Daily  . polyethylene glycol  17 g Oral QHS  . QUEtiapine  50 mg Oral BID  . sodium chloride  3 mL Intravenous Q12H  . warfarin  5 mg Oral Once  . Warfarin - Pharmacist Dosing Inpatient   Does not  apply Q24H   Continuous Infusions:      Time spent: 35 minutes    [facility] A  [name] Pager [phone] If 7PM-7AM, please contact night-coverage at [email or web address], password [facility] 05/20/2014, 12:16 PM  LOS: 4 days

## 2014-05-20 NOTE — Progress Notes (Signed)
ANTICOAGULATION CONSULT NOTE -  Pharmacy Consult for Coumadin Indication: atrial fibrillation  Allergies  Allergen Reactions  . Nitroglycerin Other (See Comments)    CAUSED NUMBNESS ALL OVER  . Doxycycline Itching  . Flexeril [Cyclobenzaprine] Other (See Comments)    Sweating, Lightheaded   . Ibuprofen Other (See Comments)    Increase BP, dizziness, lightheaded  . Tramadol Itching  . Tylenol [Acetaminophen] Other (See Comments)    Pt has Hep C  . Vesicare [Solifenacin] Other (See Comments)    Makes my sweat turn yellow  . Amoxil [Amoxicillin] Nausea Only   Patient Measurements: Height: 5\' 6"  (167.6 cm) Weight: 229 lb 11.5 oz (104.2 kg) IBW/kg (Calculated) : 59.3  Vital Signs: Temp: 97.7 F (36.5 C) (06/14 0420) Temp src: Oral (06/14 0420) BP: 120/71 mmHg (06/14 0420) Pulse Rate: 74 (06/14 0420)  Labs:  Recent Labs  05/18/14 0626 05/19/14 0643 05/20/14 0525  HGB 13.5  --  14.4  HCT 41.0  --  42.7  PLT 178  --  197  LABPROT 19.7* 24.4* 23.4*  INR 1.72* 2.28* 2.16*  CREATININE 0.91 0.84 0.82  TROPONINI <0.30  --   --    Estimated Creatinine Clearance: 101.3 ml/min (by C-G formula based on Cr of 0.82).  Medical History: Past Medical History  Diagnosis Date  . COPD (chronic obstructive pulmonary disease)     Home O2 3L  . Coronary atherosclerosis of native coronary artery     Cardiac catheterization 07/2012 - LAD 40%; small D2 with 60 to 70% ostial; OM1 30 to 40%; RCA 20%; PL1 40%   . Hypothyroidism   . Hepatitis C   . Pneumonia   . Cirrhosis   . Type 2 diabetes mellitus   . GERD (gastroesophageal reflux disease)   . Headache(784.0)   . Anxiety   . Atrial fibrillation and flutter   . Chronic pain   . PE (pulmonary embolism)   . Chronic anticoagulation   . History of medication noncompliance   . SSS (sick sinus syndrome)     Biotronik pacemaker  . Cardiomyopathy     LVEF 40-45%   Medications:  Prescriptions prior to admission  Medication Sig  Dispense Refill  . albuterol (PROVENTIL HFA;VENTOLIN HFA) 108 (90 BASE) MCG/ACT inhaler Inhale 2 puffs into the lungs every 6 (six) hours as needed for wheezing or shortness of breath.       . budesonide-formoterol (SYMBICORT) 80-4.5 MCG/ACT inhaler Inhale 2 puffs into the lungs 2 (two) times daily.      . furosemide (LASIX) 80 MG tablet Take 160 mg by mouth 2 (two) times daily.      . metoCLOPramide (REGLAN) 10 MG tablet Take 1 tablet (10 mg total) by mouth every 6 (six) hours as needed for nausea.  30 tablet  0  . metoprolol (LOPRESSOR) 50 MG tablet Take 25 mg by mouth 3 (three) times daily.      Marland Kitchen. omeprazole (PRILOSEC) 40 MG capsule Take 40 mg by mouth daily.      Marland Kitchen. oxyCODONE (OXY IR/ROXICODONE) 5 MG immediate release tablet Take 1 tablet (5 mg total) by mouth every 4 (four) hours as needed for severe pain.  12 tablet  0  . QUEtiapine (SEROQUEL) 25 MG tablet Take 50 mg by mouth 2 (two) times daily.      Marland Kitchen. tiotropium (SPIRIVA) 18 MCG inhalation capsule Place 18 mcg into inhaler and inhale daily.      Marland Kitchen. warfarin (COUMADIN) 4 MG tablet Take 4 mg by  mouth at bedtime.       Assessment: 48yo obese female on chronic Coumadin PTA for h/o afib.  Home dose is listed above.  INR is subtherapeutic on admission.  Lovenox until INR therapeutic and managing Coumadin.  CBC stable, PLTs OK.   INR therapeutic  Goal of Therapy:  INR 2-3 Monitor platelets by anticoagulation protocol: Yes   Plan:   Coumadin 5 mg po today x 1 at 4pm  INR daily  Monitor CBC / Platelets  Raquel James, Tara Rud Bennett 05/20/2014,8:18 AM

## 2014-05-21 DIAGNOSIS — I5033 Acute on chronic diastolic (congestive) heart failure: Secondary | ICD-10-CM

## 2014-05-21 LAB — BASIC METABOLIC PANEL
BUN: 18 mg/dL (ref 6–23)
CHLORIDE: 90 meq/L — AB (ref 96–112)
CO2: 37 mEq/L — ABNORMAL HIGH (ref 19–32)
Calcium: 8.9 mg/dL (ref 8.4–10.5)
Creatinine, Ser: 0.81 mg/dL (ref 0.50–1.10)
GFR calc non Af Amer: 84 mL/min — ABNORMAL LOW (ref >90)
Glucose, Bld: 124 mg/dL — ABNORMAL HIGH (ref 70–99)
Potassium: 3.6 mEq/L — ABNORMAL LOW (ref 3.7–5.3)
SODIUM: 138 meq/L (ref 137–147)

## 2014-05-21 LAB — PROTIME-INR
INR: 2.03 — ABNORMAL HIGH (ref 0.00–1.49)
PROTHROMBIN TIME: 22.3 s — AB (ref 11.6–15.2)

## 2014-05-21 LAB — CBC
HCT: 42.1 % (ref 36.0–46.0)
Hemoglobin: 14.4 g/dL (ref 12.0–15.0)
MCH: 34.4 pg — ABNORMAL HIGH (ref 26.0–34.0)
MCHC: 34.2 g/dL (ref 30.0–36.0)
MCV: 100.5 fL — AB (ref 78.0–100.0)
PLATELETS: 208 10*3/uL (ref 150–400)
RBC: 4.19 MIL/uL (ref 3.87–5.11)
RDW: 14.7 % (ref 11.5–15.5)
WBC: 7.8 10*3/uL (ref 4.0–10.5)

## 2014-05-21 MED ORDER — MAGNESIUM OXIDE 400 (241.3 MG) MG PO TABS
400.0000 mg | ORAL_TABLET | Freq: Every day | ORAL | Status: DC
  Administered 2014-05-21 – 2014-05-22 (×2): 400 mg via ORAL
  Filled 2014-05-21 (×2): qty 1

## 2014-05-21 MED ORDER — METOLAZONE 5 MG PO TABS
2.5000 mg | ORAL_TABLET | Freq: Once | ORAL | Status: AC
  Administered 2014-05-21: 2.5 mg via ORAL
  Filled 2014-05-21: qty 1

## 2014-05-21 MED ORDER — WARFARIN SODIUM 5 MG PO TABS
5.0000 mg | ORAL_TABLET | Freq: Once | ORAL | Status: AC
  Administered 2014-05-21: 5 mg via ORAL
  Filled 2014-05-21: qty 1

## 2014-05-21 MED ORDER — POTASSIUM CHLORIDE CRYS ER 20 MEQ PO TBCR
40.0000 meq | EXTENDED_RELEASE_TABLET | Freq: Four times a day (QID) | ORAL | Status: AC
  Administered 2014-05-21 (×2): 40 meq via ORAL
  Filled 2014-05-21 (×2): qty 2

## 2014-05-21 NOTE — Progress Notes (Signed)
Notified by telemetry that patient had a six beat run of vtach. Patient was sleeping with no complaints. VS were within normal limits. MD was notified. Will continue to monitor patient at this time.

## 2014-05-21 NOTE — Progress Notes (Signed)
[name] PROGRESS NOTE   ASHETON [name] LKG:[id number] DOB: 09/04/1966 DOA: 05/16/2014 PCP: Dorian Heckle, MD  HPI/Subjective: Did not milk of magnesia from yesterday, still constipated. Patient is emotionally overwhelmed as her brother is on a ventilator in [facility] in [place].  Assessment/Plan: Principal Problem:   Acute on chronic systolic heart failure Active Problems:   COPD (chronic obstructive pulmonary disease)   Atrial fibrillation   Coronary atherosclerosis of native coronary artery   Pacemaker   Hepatic cirrhosis due to chronic hepatitis C infection   Depression with anxiety   Tobacco abuse   Elevated troponin I level   Chronic anticoagulation   Chronic respiratory failure   Poor dentition   Acute on chronic diastolic heart failure   Acute on chronic diastolic congestive heart failure    Acute on chronic systolic and diastolic CHF -Presented with shortness of breath and lower extremity swelling. -2-D echocardiogram on January 9 of 2015 showed ejection fraction of 40-45% with diffuse hypokinesis. -Repeat 2-D echo showed LVEF of 55-60% with grade 2 diastolic dysfunction. -Patient started on diuresis with IV Lasix. -Cardiology consulted, continue fluids restriction and daily weights. -Ambulates patient.  Elevated troponin -Minimally elevated troponin of 0.33, patient denies any chest pain. -Likely secondary to the heart strain from CHF, repeat is normal this a.m.  Atrial fibrillation -Heart rate is controlled with metoprolol 25 mg 3 times a day. -Patient supposed to be on Coumadin, her INR is 1.38. -INR is therapeutic, heart rate is in the use 70s  COPD -Patient continued to smoke, counseled extensively about her smoking habit. -No wheezing more than her baseline, no change in her sputum production recently.  Pacemaker  Hepatic cirrhosis due to chronic hepatitis C infection -Has hypoalbuminemia, but no coagulopathy,  thrombocytopenia or hyperbilirubinemia. -seems to be a compensated hepatic cirrhosis.  Abdominal pain -Left lower quadrant abdominal pain, abdominal x-ray showed no acute abnormalities. -Add laxative.  Code Status: Full code Family Communication: Plan discussed with the patient. Disposition Plan: Remains inpatient   Consultants:  Cardiology  Procedures:  None  Antibiotics:  None   Objective: Filed Vitals:   05/21/14 1404  BP: 122/85  Pulse: 82  Temp: 98 F (36.7 C)  Resp: 20    Intake/Output Summary (Last 24 hours) at 05/21/14 1416 Last data filed at 05/21/14 1200  Gross per 24 hour  Intake   1086 ml  Output    525 ml  Net    561 ml   Filed Weights   05/19/14 0557 05/20/14 0420 05/21/14 0422  Weight: 103.5 kg (228 lb 2.8 oz) 104.2 kg (229 lb 11.5 oz) 103.9 kg (229 lb 0.9 oz)    Exam: General: Alert and awake, oriented x3, not in any acute distress. HEENT: anicteric sclera, pupils reactive to light and accommodation, EOMI CVS: S1-S2 clear, no murmur rubs or gallops Chest: clear to auscultation bilaterally, no wheezing, rales or rhonchi Abdomen: soft nontender, nondistended, normal bowel sounds, no organomegaly Extremities: no cyanosis, clubbing or edema noted bilaterally Neuro: Cranial nerves II-XII intact, no focal neurological deficits  Data Reviewed: Basic Metabolic Panel:  Recent Labs Lab 05/17/14 0251 05/18/14 0545 05/18/14 0626 05/19/14 0643 05/20/14 0525 05/21/14 0413  NA 137  --  143 135* 137 138  K 3.3*  --  4.4 3.8 3.9 3.6*  CL 94*  --  99 89* 91* 90*  CO2 33*  --  35* 30 35* 37*  GLUCOSE 113*  --  98 91 103* 124*  BUN 6  --  9 11 15 18   CREATININE 0.73  --  0.91 0.84 0.82 0.81  CALCIUM 8.1*  --  8.5 9.0 8.7 8.9  MG  --  1.6  --  1.8 1.7  --    Liver Function Tests:  Recent Labs Lab 05/17/14 0251  AST 33  ALT 23  ALKPHOS 66  BILITOT 0.9  PROT 6.8  ALBUMIN 3.0*   No results found for this basename: LIPASE, AMYLASE,  in  the last 168 hours No results found for this basename: AMMONIA,  in the last 168 hours CBC:  Recent Labs Lab 05/16/14 1523 05/17/14 0251 05/18/14 0626 05/20/14 0525 05/21/14 0413  WBC 6.7 5.4 5.6 7.1 7.8  HGB 12.7 12.3 13.5 14.4 14.4  HCT 38.0 36.6 41.0 42.7 42.1  MCV 100.8* 101.7* 102.5* 100.7* 100.5*  PLT 171 154 178 197 208   Cardiac Enzymes:  Recent Labs Lab 05/16/14 1721 05/16/14 2117 05/17/14 0251 05/17/14 0818 05/18/14 0626  TROPONINI 0.40* <0.30 0.32* 0.33* <0.30   BNP (last 3 results)  Recent Labs  02/03/14 0238 03/10/14 1758 05/16/14 1523  PROBNP 1995.0* 1022.0* 2906.0*   CBG:  Recent Labs Lab 05/17/14 0735  GLUCAP 93    Micro Recent Results (from the past 240 hour(s))  MRSA PCR SCREENING     Status: Abnormal   Collection Time    05/16/14 10:55 PM      Result Value Ref Range Status   MRSA by PCR POSITIVE (*) NEGATIVE Final   Comment:            The GeneXpert MRSA Assay (FDA     approved for NASAL specimens     only), is one component of a     comprehensive MRSA colonization     surveillance program. It is not     intended to diagnose MRSA     infection nor to guide or     monitor treatment for     MRSA infections.     RESULT CALLED TO, READ BACK BY AND VERIFIED WITH:     HAMMOCK S AT 0243 ON [id number] BY FORSYTH K     Studies: No results found.  Scheduled Meds: . antiseptic oral rinse  15 mL Mouth Rinse BID  . furosemide  80 mg Intravenous Q12H  . ipratropium-albuterol  3 mL Nebulization Q6H  . magnesium hydroxide  30 mL Oral Once  . magnesium hydroxide  30 mL Oral Once  . magnesium oxide  400 mg Oral Daily  . methocarbamol  500 mg Oral TID  . metoprolol  25 mg Oral BID  . mometasone-formoterol  2 puff Inhalation BID  . pantoprazole  40 mg Oral Daily  . polyethylene glycol  17 g Oral QHS  . potassium chloride  40 mEq Oral Q6H  . QUEtiapine  50 mg Oral BID  . sodium chloride  3 mL Intravenous Q12H  . warfarin  5 mg Oral Once    . Warfarin - Pharmacist Dosing Inpatient   Does not apply Q24H   Continuous Infusions:      Time spent: 35 minutes    [facility]MA[facility],Jasia Hiltunen A  [name] Pager [phone] If 7PM-7AM, please contact night-coverage at [email or web address], password [facility] 05/21/2014, 2:16 PM  LOS: 5 days

## 2014-05-21 NOTE — Progress Notes (Signed)
Patient ID: Kim [name], female   DOB: 09/29/1966, 48 y.o.   MRN: [id number]    Subjective:    + SOB, + DOE  Objective:   Temp:  [97.7 F (36.5 C)-98.5 F (36.9 C)] 98 F (36.7 C) (06/15 0422) Pulse Rate:  [75-87] 77 (06/15 0422) Resp:  [20] 20 (06/15 0422) BP: (106-127)/(68-80) 112/75 mmHg (06/15 0422) SpO2:  [98 %-100 %] 100 % (06/15 0422) Weight:  [229 lb 0.9 oz (103.9 kg)] 229 lb 0.9 oz (103.9 kg) (06/15 0422) Last BM Date: 05/17/14  Filed Weights   05/19/14 0557 05/20/14 0420 05/21/14 0422  Weight: 228 lb 2.8 oz (103.5 kg) 229 lb 11.5 oz (104.2 kg) 229 lb 0.9 oz (103.9 kg)    Intake/Output Summary (Last 24 hours) at 05/21/14 0855 Last data filed at 05/21/14 0300  Gross per 24 hour  Intake    723 ml  Output    525 ml  Net    198 ml    Telemetry: V-paced  Exam:  General:NAD  Resp: faint crackles bilateral bases  Cardiac: RRR, no m/r/g, no JVD  WU:JWJXBJY soft, NT, ND  MSK: trace bilateral edema  Neuro: no focal deficits  Psych: appropriate affect  Lab Results:  Basic Metabolic Panel:  Recent Labs Lab 05/18/14 0545  05/19/14 0643 05/20/14 0525 05/21/14 0413  NA  --   < > 135* 137 138  K  --   < > 3.8 3.9 3.6*  CL  --   < > 89* 91* 90*  CO2  --   < > 30 35* 37*  GLUCOSE  --   < > 91 103* 124*  BUN  --   < > 11 15 18   CREATININE  --   < > 0.84 0.82 0.81  CALCIUM  --   < > 9.0 8.7 8.9  MG 1.6  --  1.8 1.7  --   < > = values in this interval not displayed.  Liver Function Tests:  Recent Labs Lab 05/17/14 0251  AST 33  ALT 23  ALKPHOS 66  BILITOT 0.9  PROT 6.8  ALBUMIN 3.0*    CBC:  Recent Labs Lab 05/18/14 0626 05/20/14 0525 05/21/14 0413  WBC 5.6 7.1 7.8  HGB 13.5 14.4 14.4  HCT 41.0 42.7 42.1  MCV 102.5* 100.7* 100.5*  PLT 178 197 208    Cardiac Enzymes:  Recent Labs Lab 05/17/14 0251 05/17/14 0818 05/18/14 0626  TROPONINI 0.32* 0.33* <0.30    BNP:  Recent Labs  02/03/14 0238 03/10/14 1758  05/16/14 1523  PROBNP 1995.0* 1022.0* 2906.0*    Coagulation:  Recent Labs Lab 05/19/14 0643 05/20/14 0525 05/21/14 0413  INR 2.28* 2.16* 2.03*    ECG:   Medications:   Scheduled Medications: . antiseptic oral rinse  15 mL Mouth Rinse BID  . furosemide  80 mg Intravenous Q12H  . ipratropium-albuterol  3 mL Nebulization Q6H  . magnesium hydroxide  30 mL Oral Once  . magnesium hydroxide  30 mL Oral Once  . methocarbamol  500 mg Oral TID  . metoprolol  25 mg Oral BID  . mometasone-formoterol  2 puff Inhalation BID  . mupirocin ointment  1 application Nasal BID  . pantoprazole  40 mg Oral Daily  . polyethylene glycol  17 g Oral QHS  . QUEtiapine  50 mg Oral BID  . sodium chloride  3 mL Intravenous Q12H  . Warfarin - Pharmacist Dosing Inpatient   Does not apply Q24H  Infusions:     PRN Medications:  acetaminophen, acetaminophen, ALPRAZolam, metoCLOPramide, ondansetron (ZOFRAN) IV, ondansetron, oxyCODONE, oxyCODONE   05/17/14 Echo Study Conclusions  - Left ventricle: The cavity size was normal. There was mild focal basal hypertrophy of the septum. Systolic function was normal. The estimated ejection fraction was in the range of 55% to 60%. Features are consistent with a pseudonormal left ventricular filling pattern, with concomitant abnormal relaxation and increased filling pressure (grade 2 diastolic dysfunction). Doppler parameters are consistent with elevated mean left atrial filling pressure. - Ventricular septum: Septal motion showed abnormal function and dyssynergy. - Mitral valve: Calcified annulus. There was mild regurgitation. - Left atrium: The atrium was moderately dilated. - Right ventricle: Pacer wire or catheter noted in right ventricle. Systolic function was mildly reduced. - Right atrium: Central venous pressure (est): 8 mm Hg. - Tricuspid valve: There was mild regurgitation. - Pulmonary arteries: Systolic pressure could not be  accurately estimated. - Pericardium, extracardiac: A prominent pericardial fat pad was present. A trivial pericardial effusion was identified.  Impressions:  - Mild basal septal hypertrophy with LVEF 55-60%. Paradoxical septal motion as well as mild apical hypokinesis with paradoxical motion possibly related to pacing. Grade 1 diastolic dysfunction with increased filling pressures. Moderate left atrial enlargement. MAC with mild mitral regurgitation. Device wire noted in right heart. Mildly reduced RV contraction. Unable to assess PASP. Prominent pericardial fat pad as well as trivial pericardial effusion noted.  Cardiac Cath 07/22/2012  Left main: Normal  LAD: Large vessel. There is a curly-Q in the mid-vessel which has associated calcification and about 40% stenosis. Mild plaque distally. 2 small diagonals In ostium of small D2 about 60-70% ostial stenosis.  LCX: Made up primarily of large OM-1 with 30-40% mid stenosis  RCA: Very large dominant vessel. 20% mid stenosis. PL1 with 40% proximal stenosis  LV-gram done in the RAO projection: Ejection fraction = Not well opacified, EF ~40% with apical dyskinesis  Assessment:  1. Mild non-obstructive CAD  2. Apparent mild-moderate LV dysfunction with apical dyskinesis  Plan/Discussion:  Based on coronary angiography CP does not appear ischemic in nature. She does appear to have a least a mild degree of LV dysfunction. Would get echo to further evaluate. Can be discharged home in am with outpatient f/u from our standpoint.    Assessment/Plan     1. Acute on chronic diastolic heart failure - primary cardiologist Dr Piedad Climes in [place]. Per notes she previously had moderate LV systolic dysfunction in 07/2012, however LVEF has since normalized. Echo 05/17/14 LVEF 55-60%, grade 2 diastolic dysfunction.  - exacerbating factor likely dietary non-compliance - charting only shows fairly minimal urine output, she is reported +356 mL  since admission, though her weights show a 10 lbs weight loss. Cr and BUN remain stable, she has been on lasix 80mg  IV bid. Her symptoms are improving but she is not quite at her baseline per her report.   - mild trop elevation likely related to volume overload, cath in 2013 with mild non-obstructive disease. - likely still volume up. Will give a dose of oral metolazone today. Consider ambulating patient later in afternoon, pending symptoms consider late discharge today or potentially tomorrow. At discharge would change to oral torsemide 40mg  bid with close follow up in a week with NP Lyman Bishop or myself.  2. Pacemaker - biotronik EVIA DR-T device. Check 05/18/14 shows normal function. Did show 11 high ventricular rate episodes, but overall normal histogram.   3. Afib - on rate control  with metoprolol, coumadin for anticoag.   4. COPD - management per primary team     Dina RichJonathan [name], M.D., F.A.C.C.

## 2014-05-21 NOTE — Progress Notes (Signed)
ANTICOAGULATION CONSULT NOTE -  Pharmacy Consult for Coumadin Indication: atrial fibrillation  Allergies  Allergen Reactions  . Nitroglycerin Other (See Comments)    CAUSED NUMBNESS ALL OVER  . Doxycycline Itching  . Flexeril [Cyclobenzaprine] Other (See Comments)    Sweating, Lightheaded   . Ibuprofen Other (See Comments)    Increase BP, dizziness, lightheaded  . Tramadol Itching  . Tylenol [Acetaminophen] Other (See Comments)    Pt has Hep C  . Vesicare [Solifenacin] Other (See Comments)    Makes my sweat turn yellow  . Amoxil [Amoxicillin] Nausea Only   Patient Measurements: Height: 5\' 6"  (167.6 cm) Weight: 229 lb 0.9 oz (103.9 kg) IBW/kg (Calculated) : 59.3  Vital Signs: Temp: 98 F (36.7 C) (06/15 0422) Temp src: Oral (06/15 0422) BP: 112/75 mmHg (06/15 0422) Pulse Rate: 77 (06/15 0422)  Labs:  Recent Labs  05/19/14 0643 05/20/14 0525 05/21/14 0413  HGB  --  14.4 14.4  HCT  --  42.7 42.1  PLT  --  197 208  LABPROT 24.4* 23.4* 22.3*  INR 2.28* 2.16* 2.03*  CREATININE 0.84 0.82 0.81   Estimated Creatinine Clearance: 102.3 ml/min (by C-G formula based on Cr of 0.81).  Medical History: Past Medical History  Diagnosis Date  . COPD (chronic obstructive pulmonary disease)     Home O2 3L  . Coronary atherosclerosis of native coronary artery     Cardiac catheterization 07/2012 - LAD 40%; small D2 with 60 to 70% ostial; OM1 30 to 40%; RCA 20%; PL1 40%   . Hypothyroidism   . Hepatitis C   . Pneumonia   . Cirrhosis   . Type 2 diabetes mellitus   . GERD (gastroesophageal reflux disease)   . Headache(784.0)   . Anxiety   . Atrial fibrillation and flutter   . Chronic pain   . PE (pulmonary embolism)   . Chronic anticoagulation   . History of medication noncompliance   . SSS (sick sinus syndrome)     Biotronik pacemaker  . Cardiomyopathy     LVEF 40-45%   Medications:  Prescriptions prior to admission  Medication Sig Dispense Refill  . albuterol  (PROVENTIL HFA;VENTOLIN HFA) 108 (90 BASE) MCG/ACT inhaler Inhale 2 puffs into the lungs every 6 (six) hours as needed for wheezing or shortness of breath.       . budesonide-formoterol (SYMBICORT) 80-4.5 MCG/ACT inhaler Inhale 2 puffs into the lungs 2 (two) times daily.      . furosemide (LASIX) 80 MG tablet Take 160 mg by mouth 2 (two) times daily.      . metoCLOPramide (REGLAN) 10 MG tablet Take 1 tablet (10 mg total) by mouth every 6 (six) hours as needed for nausea.  30 tablet  0  . metoprolol (LOPRESSOR) 50 MG tablet Take 25 mg by mouth 3 (three) times daily.      Marland Kitchen. omeprazole (PRILOSEC) 40 MG capsule Take 40 mg by mouth daily.      Marland Kitchen. oxyCODONE (OXY IR/ROXICODONE) 5 MG immediate release tablet Take 1 tablet (5 mg total) by mouth every 4 (four) hours as needed for severe pain.  12 tablet  0  . QUEtiapine (SEROQUEL) 25 MG tablet Take 50 mg by mouth 2 (two) times daily.      Marland Kitchen. tiotropium (SPIRIVA) 18 MCG inhalation capsule Place 18 mcg into inhaler and inhale daily.      Marland Kitchen. warfarin (COUMADIN) 4 MG tablet Take 4 mg by mouth at bedtime.  Assessment: 48yo obese female on chronic Coumadin PTA for h/o afib.  Home dose is listed above.  INR was subtherapeutic on admission but has now trended up to low end of therapeutic range with dose increases.  Lovenox has been d/c'd due to therapeutic INR.  CBC stable, PLTs OK.    Goal of Therapy:  INR 2-3 Monitor platelets by anticoagulation protocol: Yes   Plan:   Coumadin 5 mg po today x 1 to discourage drop  INR daily  Monitor CBC / Platelets  Madalin Hughart A 05/21/2014,10:51 AM

## 2014-05-22 LAB — BASIC METABOLIC PANEL
BUN: 20 mg/dL (ref 6–23)
CHLORIDE: 89 meq/L — AB (ref 96–112)
CO2: 36 mEq/L — ABNORMAL HIGH (ref 19–32)
Calcium: 8.9 mg/dL (ref 8.4–10.5)
Creatinine, Ser: 0.84 mg/dL (ref 0.50–1.10)
GFR, EST NON AFRICAN AMERICAN: 80 mL/min — AB (ref >90)
Glucose, Bld: 160 mg/dL — ABNORMAL HIGH (ref 70–99)
POTASSIUM: 3.2 meq/L — AB (ref 3.7–5.3)
Sodium: 136 mEq/L — ABNORMAL LOW (ref 137–147)

## 2014-05-22 LAB — PROTIME-INR
INR: 2.28 — ABNORMAL HIGH (ref 0.00–1.49)
PROTHROMBIN TIME: 24.4 s — AB (ref 11.6–15.2)

## 2014-05-22 MED ORDER — POTASSIUM CHLORIDE CRYS ER 20 MEQ PO TBCR
60.0000 meq | EXTENDED_RELEASE_TABLET | Freq: Four times a day (QID) | ORAL | Status: DC
  Administered 2014-05-22: 60 meq via ORAL
  Filled 2014-05-22: qty 3

## 2014-05-22 MED ORDER — METOLAZONE 5 MG PO TABS
5.0000 mg | ORAL_TABLET | Freq: Once | ORAL | Status: AC
  Administered 2014-05-22: 5 mg via ORAL
  Filled 2014-05-22: qty 1

## 2014-05-22 MED ORDER — MAGNESIUM OXIDE 400 (241.3 MG) MG PO TABS: 400.0000 mg | ORAL_TABLET | Freq: Every day | ORAL | Status: DC

## 2014-05-22 MED ORDER — TORSEMIDE 20 MG PO TABS: 40.0000 mg | ORAL_TABLET | Freq: Two times a day (BID) | ORAL | Status: DC

## 2014-05-22 MED ORDER — WARFARIN SODIUM 5 MG PO TABS: 5.0000 mg | ORAL_TABLET | Freq: Once | ORAL | Status: DC

## 2014-05-22 MED ORDER — POTASSIUM CHLORIDE CRYS ER 20 MEQ PO TBCR: 20.0000 meq | EXTENDED_RELEASE_TABLET | Freq: Every day | ORAL | Status: DC

## 2014-05-22 NOTE — Progress Notes (Signed)
Patient with orders to be discharge home. Discharge instructions given, patient verbalized understanding. Prescriptions given. Patient stable. Patient left in public transportation vehicle.

## 2014-05-22 NOTE — Progress Notes (Signed)
Patient ID: Kim [name], female   DOB: December 21, 1965, 48 y.o.   MRN: [id number]      Subjective:    SOB improving but not back to baseline.   Objective:   Temp:  [97.8 F (36.6 C)-98.2 F (36.8 C)] 97.8 F (36.6 C) (06/16 0653) Pulse Rate:  [75-87] 75 (06/16 0653) Resp:  [20] 20 (06/16 0653) BP: (122-143)/(83-95) 131/83 mmHg (06/16 0653) SpO2:  [93 %-98 %] 98 % (06/16 0653) Weight:  [229 lb 4.5 oz (104 kg)] 229 lb 4.5 oz (104 kg) (06/16 0653) Last BM Date: 05/17/14  Filed Weights   05/20/14 0420 05/21/14 0422 05/22/14 0653  Weight: 229 lb 11.5 oz (104.2 kg) 229 lb 0.9 oz (103.9 kg) 229 lb 4.5 oz (104 kg)    Intake/Output Summary (Last 24 hours) at 05/22/14 0827 Last data filed at 05/22/14 0600  Gross per 24 hour  Intake    483 ml  Output    650 ml  Net   -167 ml    Telemetry: V-paced  Exam:  General:NAD  Resp: CTAB  Cardiac: RRR, no m/r/g, no JVD  GI: abdomen soft, NT, ND  MSK: LEs are warm, no edema  Neuro: no focal deficits  Psych: appropriate affect  Lab Results:  Basic Metabolic Panel:  Recent Labs Lab 05/18/14 0545  05/19/14 0643 05/20/14 0525 05/21/14 0413 05/22/14 0444  NA  --   < > 135* 137 138 136*  K  --   < > 3.8 3.9 3.6* 3.2*  CL  --   < > 89* 91* 90* 89*  CO2  --   < > 30 35* 37* 36*  GLUCOSE  --   < > 91 103* 124* 160*  BUN  --   < > 11 15 18 20   CREATININE  --   < > 0.84 0.82 0.81 0.84  CALCIUM  --   < > 9.0 8.7 8.9 8.9  MG 1.6  --  1.8 1.7  --   --   < > = values in this interval not displayed.  Liver Function Tests:  Recent Labs Lab 05/17/14 0251  AST 33  ALT 23  ALKPHOS 66  BILITOT 0.9  PROT 6.8  ALBUMIN 3.0*    CBC:  Recent Labs Lab 05/18/14 0626 05/20/14 0525 05/21/14 0413  WBC 5.6 7.1 7.8  HGB 13.5 14.4 14.4  HCT 41.0 42.7 42.1  MCV 102.5* 100.7* 100.5*  PLT 178 197 208    Cardiac Enzymes:  Recent Labs Lab 05/17/14 0251 05/17/14 0818 05/18/14 0626  TROPONINI 0.32* 0.33* <0.30     BNP:  Recent Labs  02/03/14 0238 03/10/14 1758 05/16/14 1523  PROBNP 1995.0* 1022.0* 2906.0*    Coagulation:  Recent Labs Lab 05/20/14 0525 05/21/14 0413 05/22/14 0444  INR 2.16* 2.03* 2.28*    ECG:   Medications:   Scheduled Medications: . antiseptic oral rinse  15 mL Mouth Rinse BID  . furosemide  80 mg Intravenous Q12H  . ipratropium-albuterol  3 mL Nebulization Q6H  . magnesium hydroxide  30 mL Oral Once  . magnesium hydroxide  30 mL Oral Once  . magnesium oxide  400 mg Oral Daily  . methocarbamol  500 mg Oral TID  . metoprolol  25 mg Oral BID  . mometasone-formoterol  2 puff Inhalation BID  . pantoprazole  40 mg Oral Daily  . polyethylene glycol  17 g Oral QHS  . potassium chloride  60 mEq Oral Q6H  . QUEtiapine  50 mg Oral BID  . sodium chloride  3 mL Intravenous Q12H  . Warfarin - Pharmacist Dosing Inpatient   Does not apply Q24H     Infusions:     PRN Medications:  acetaminophen, acetaminophen, ALPRAZolam, metoCLOPramide, ondansetron (ZOFRAN) IV, ondansetron, oxyCODONE     Assessment/Plan    1. Acute on chronic diastolic heart failure  - primary cardiologist Dr Piedad Climes in [place]. Per notes she previously had moderate LV systolic dysfunction in 07/2012, however LVEF has since normalized. Echo 05/17/14 LVEF 55-60%, grade 2 diastolic dysfunction.  - exacerbating factor likely dietary non-compliance  - charting only shows fairly minimal urine output, she is reported +507 mL since admission, though her weights show a 10 lbs weight loss. Cr and BUN remain stable, she has been on lasix 80mg  IV bid and she received a one time dose of metolazone 2.5mg  yesterday. Her symptoms have improved but not back to her baseline - will give additional dose of metolazone 5mg  x 1 today.  - she is anxious to see her brother in another hospital who is very ill, if ok with ambulation this AM ok to discharge with oral torsemide 40mg  bid with follow up w/  NP Lawrence in 1-2 weeks.  .   2. Pacemaker  - biotronik EVIA DR-T device. Check 05/18/14 shows normal function. Did show 11 high ventricular rate episodes, but overall normal histogram.   3. Afib  - on rate control with metoprolol, coumadin for anticoag.   4. COPD  - management per primary team  5. Hypokalemia - please keep K at 4.       [name], M.D., F.A.C.C.

## 2014-05-22 NOTE — Discharge Summary (Signed)
Physician Discharge Summary  Kim [name]:[id number] DOB: [birth year] DOA: 05/16/2014  PCP: Dorian Heckle, MD  Admit date: 05/16/2014 Discharge date: 05/22/2014  Time spent: 40 minutes  Recommendations for Outpatient Follow-up:  1. Follow up with Cardiology in 1 week. 2. Check BMP and magnesium in 1 week  Discharge Diagnoses:  Principal Problem:   Acute on chronic systolic heart failure Active Problems:   COPD (chronic obstructive pulmonary disease)   Atrial fibrillation   Coronary atherosclerosis of native coronary artery   Pacemaker   Hepatic cirrhosis due to chronic hepatitis C infection   Depression with anxiety   Tobacco abuse   Elevated troponin I level   Chronic anticoagulation   Chronic respiratory failure   Poor dentition   Acute on chronic diastolic heart failure   Acute on chronic diastolic congestive heart failure   Discharge Condition: Stable  Diet recommendation: Heart healthy diet  Filed Weights   05/20/14 0420 05/21/14 0422 05/22/14 0653  Weight: 104.2 kg (229 lb 11.5 oz) 103.9 kg (229 lb 0.9 oz) 104 kg (229 lb 4.5 oz)    History of present illness:  Kim [name] is a 48 y.o. female with a history of systolic congestive heart failure, atrial fibrillation on Coumadin, status post pacemaker, coronary artery disease, oxygen dependent COPD, and cirrhosis, who presents to the hospital today with a complaint of shortness of breath. Her shortness of breath started 2-3 days ago. It occurs when she lays flat and with ambulation. She is less short of breath at rest. She has also had central /substernal chest pain which is associated with shortness of breath. She's had some radiation of the pain to the left shoulder and nausea, but no diaphoresis. She denies associated palpitations. She denies abdominal swelling or an increase in swelling in her legs. She denies abdominal pain, vomiting, diarrhea, or pain with urination. She continues to smoke 5-6 cigarettes daily.  She says that she wears her home oxygen most of the time. She is on 2-3 L of oxygen per minute. She denies missing taking any of her medicines although she has been in the process of moving from [facility] to [facility].  In the ED, she was afebrile and hemodynamically stable. She is currently chest pain-free. Her EKG revealed ventricular paced rhythm with a heart rate of 81 beats per minute. Her chest x-ray revealed a suspicion of congestive heart failure, cardiomegaly, and pulmonary venous hypertension. Her initial troponin I was 0.38 and was repeated at 0.40. Her pro BNP was 2906. She is being admitted for further evaluation and management.  Of note, ED physician, Dr. Estell Harpin had spoken to cardiology at [facility]. Apparently, Dr. Donnie Aho agreed to consult on the patient if she were to be transferred there. However, the patient did not want to be transferred to [facility] Course:   Acute on chronic systolic and diastolic CHF  -Presented with shortness of breath and lower extremity swelling.  -2-D echocardiogram on January 9 of 2015 showed ejection fraction of 40-45% with diffuse hypokinesis.  -Repeat 2-D echo showed LVEF of 55-60% with grade 2 diastolic dysfunction.  -Patient started on diuresis with IV Lasix.  -Cardiology consulted, continue fluids restriction and daily weights.  -Patient has emotional hardship as her brother is terminally ill in [facility]. -Cardiology recommended torsemide 40 mg on discharge. Continue Lopressor 25 mg 3 times a day.  Elevated troponin  -Minimally elevated troponin of 0.33, patient denies any chest  pain.  -Likely secondary to the heart strain from CHF, repeat was normal very next morning.  Atrial fibrillation  -Heart rate is controlled with metoprolol 25 mg 3 times a day.  -Patient supposed to be on Coumadin, her INR is 1.38 on admission.  -INR is therapeutic, heart rate is in the use 70s   COPD   -Patient continued to smoke, counseled extensively about her smoking habit.  -No wheezing more than her baseline, no change in her sputum production recently.   Pacemaker  -Pacemaker wires seen in the right ventricle, stable, interrogated and showed no events.  Hepatic cirrhosis due to chronic hepatitis C infection  -Has hypoalbuminemia, but no coagulopathy, thrombocytopenia or hyperbilirubinemia.  -seems to be a compensated hepatic cirrhosis.   Abdominal pain  -Left lower quadrant abdominal pain, abdominal x-ray showed no acute abnormalities.  -Had bowel movement after laxative, pain resolves afterwards.  Hypokalemia and hypomagnesemia -Most likely secondary to diuresis, patient discharged on 20 mEq of potassium and 400 mg of mag oxide.   Procedures:  2 D Echo: on 05/17/14 Impression: Mild basal septal hypertrophy with LVEF 55-60%. Paradoxical septal motion as well as mild apical hypokinesis with paradoxical motion possibly related to pacing. Grade 2 diastolic dysfunction with increased filling pressures. Moderate left atrial enlargement. MAC with mild mitral regurgitation. Device wire noted in right heart. Mildly reduced RV contraction. Unable to assess PASP. Prominent pericardial fat pad as well as trivial pericardial effusion noted.  Consultations: Cards  Discharge Exam: Filed Vitals:   05/22/14 0653  BP: 131/83  Pulse: 75  Temp: 97.8 F (36.6 C)  Resp: 20   General: Alert and awake, oriented x3, not in any acute distress. HEENT: anicteric sclera, pupils reactive to light and accommodation, EOMI CVS: S1-S2 clear, no murmur rubs or gallops Chest: clear to auscultation bilaterally, no wheezing, rales or rhonchi Abdomen: soft nontender, nondistended, normal bowel sounds, no organomegaly Extremities: no cyanosis, clubbing or edema noted bilaterally Neuro: Cranial nerves II-XII intact, no focal neurological deficits  Discharge Instructions You were cared for by a  hospitalist during your hospital stay. If you have any questions about your discharge medications or the care you received while you were in the hospital after you are discharged, you can call the unit and asked to speak with the hospitalist on call if the hospitalist that took care of you is not available. Once you are discharged, your primary care physician will handle any further medical issues. Please note that NO REFILLS for any discharge medications will be authorized once you are discharged, as it is imperative that you return to your primary care physician (or establish a relationship with a primary care physician if you do not have one) for your aftercare needs so that they can reassess your need for medications and monitor your lab values.  Discharge Instructions   Diet - low sodium heart healthy    Complete by:  As directed      Increase activity slowly    Complete by:  As directed             Medication List    STOP taking these medications       furosemide 80 MG tablet  Commonly known as:  LASIX      TAKE these medications       albuterol 108 (90 BASE) MCG/ACT inhaler  Commonly known as:  PROVENTIL HFA;VENTOLIN HFA  Inhale 2 puffs into the lungs every 6 (six) hours as needed for wheezing or shortness  of breath.     budesonide-formoterol 80-4.5 MCG/ACT inhaler  Commonly known as:  SYMBICORT  Inhale 2 puffs into the lungs 2 (two) times daily.     magnesium oxide 400 (241.3 MG) MG tablet  Commonly known as:  MAG-OX  Take 1 tablet (400 mg total) by mouth daily.     metoCLOPramide 10 MG tablet  Commonly known as:  REGLAN  Take 1 tablet (10 mg total) by mouth every 6 (six) hours as needed for nausea.     metoprolol 50 MG tablet  Commonly known as:  LOPRESSOR  Take 25 mg by mouth 3 (three) times daily.     omeprazole 40 MG capsule  Commonly known as:  PRILOSEC  Take 40 mg by mouth daily.     oxyCODONE 5 MG immediate release tablet  Commonly known as:  Oxy  IR/ROXICODONE  Take 1 tablet (5 mg total) by mouth every 4 (four) hours as needed for severe pain.     potassium chloride SA 20 MEQ tablet  Commonly known as:  K-DUR,KLOR-CON  Take 1 tablet (20 mEq total) by mouth daily.     QUEtiapine 25 MG tablet  Commonly known as:  SEROQUEL  Take 50 mg by mouth 2 (two) times daily.     tiotropium 18 MCG inhalation capsule  Commonly known as:  SPIRIVA  Place 18 mcg into inhaler and inhale daily.     torsemide 20 MG tablet  Commonly known as:  DEMADEX  Take 2 tablets (40 mg total) by mouth 2 (two) times daily.     warfarin 4 MG tablet  Commonly known as:  COUMADIN  Take 4 mg by mouth at bedtime.       Allergies  Allergen Reactions  . Nitroglycerin Other (See Comments)    CAUSED NUMBNESS ALL OVER  . Doxycycline Itching  . Flexeril [Cyclobenzaprine] Other (See Comments)    Sweating, Lightheaded   . Ibuprofen Other (See Comments)    Increase BP, dizziness, lightheaded  . Tramadol Itching  . Tylenol [Acetaminophen] Other (See Comments)    Pt has Hep C  . Vesicare [Solifenacin] Other (See Comments)    Makes my sweat turn yellow  . Amoxil [Amoxicillin] Nausea Only       Follow-up Information   Follow up with [facility] On 05/28/2014. (At 10 am, Call DSS prior to change MD name on Medicaid card.)    Contact information:   [address] [place]  [phone]      Follow up with [facility]. [facility] RN, Call [facility] at [phone] with new address)    Contact information:   [address] [place] [address] [phone]       The results of significant diagnostics from this hospitalization (including imaging, microbiology, ancillary and laboratory) are listed below for reference.    Significant Diagnostic Studies: Dg Chest Port 1 View  05/16/2014   CLINICAL DATA:  Shortness of Breath  EXAM: PORTABLE CHEST - 1 VIEW  COMPARISON:  chest radiograph and chest CT March 10, 2014  FINDINGS: The interstitium is  borderline prominent; there may be trace edema. There is no airspace consolidation. Heart is mildly enlarged with mild pulmonary venous hypertension. Pacemaker leads are attached to the right atrium and right ventricle. No pneumothorax. No adenopathy.  IMPRESSION: Suspect a degree of congestive heart failure with trace edema as well as cardiomegaly and pulmonary venous hypertension. No consolidation.   Electronically Signed   By: Bretta Bang  M.D.   On: 05/16/2014 15:13   Dg Abd 2 Views  05/19/2014   CLINICAL DATA:  Pain.  EXAM: ABDOMEN - 2 VIEW  COMPARISON:  CT 01/09/2014  FINDINGS: Soft tissue structures are unremarkable. Surgical clips right upper quadrant consistent prior cholecystectomy. Gas pattern is nonspecific. No bowel distention. No free air. Right base atelectasis and/or infiltrate with right-sided pleural effusion noted. Cardiac pacer. Cardiomegaly.  IMPRESSION: 1. No evidence of bowel distention or free air. Prior cholecystectomy. 2. Right lower lobe atelectasis and/or infiltrate with right-sided pleural effusion . 3. Cardiomegaly. Cardiac pacer with lead tips in the right atrium right ventricle.   Electronically Signed   By: Maisie Fushomas  Register   On: 05/19/2014 11:44    Microbiology: Recent Results (from the past 240 hour(s))  MRSA PCR SCREENING     Status: Abnormal   Collection Time    05/16/14 10:55 PM      Result Value Ref Range Status   MRSA by PCR POSITIVE (*) NEGATIVE Final   Comment:            The GeneXpert MRSA Assay (FDA     approved for NASAL specimens     only), is one component of a     comprehensive MRSA colonization     surveillance program. It is not     intended to diagnose MRSA     infection nor to guide or     monitor treatment for     MRSA infections.     RESULT CALLED TO, READ BACK BY AND VERIFIED WITH:     HAMMOCK S AT 0243 ON [id number] BY FORSYTH K     Labs: Basic Metabolic Panel:  Recent Labs Lab 05/18/14 0545 05/18/14 0626 05/19/14 0643  05/20/14 0525 05/21/14 0413 05/22/14 0444  NA  --  143 135* 137 138 136*  K  --  4.4 3.8 3.9 3.6* 3.2*  CL  --  99 89* 91* 90* 89*  CO2  --  35* 30 35* 37* 36*  GLUCOSE  --  98 91 103* 124* 160*  BUN  --  9 11 15 18 20   CREATININE  --  0.91 0.84 0.82 0.81 0.84  CALCIUM  --  8.5 9.0 8.7 8.9 8.9  MG 1.6  --  1.8 1.7  --   --    Liver Function Tests:  Recent Labs Lab 05/17/14 0251  AST 33  ALT 23  ALKPHOS 66  BILITOT 0.9  PROT 6.8  ALBUMIN 3.0*   No results found for this basename: LIPASE, AMYLASE,  in the last 168 hours No results found for this basename: AMMONIA,  in the last 168 hours CBC:  Recent Labs Lab 05/16/14 1523 05/17/14 0251 05/18/14 0626 05/20/14 0525 05/21/14 0413  WBC 6.7 5.4 5.6 7.1 7.8  HGB 12.7 12.3 13.5 14.4 14.4  HCT 38.0 36.6 41.0 42.7 42.1  MCV 100.8* 101.7* 102.5* 100.7* 100.5*  PLT 171 154 178 197 208   Cardiac Enzymes:  Recent Labs Lab 05/16/14 1721 05/16/14 2117 05/17/14 0251 05/17/14 0818 05/18/14 0626  TROPONINI 0.40* <0.30 0.32* 0.33* <0.30   BNP: BNP (last 3 results)  Recent Labs  02/03/14 0238 03/10/14 1758 05/16/14 1523  PROBNP 1995.0* 1022.0* 2906.0*   CBG:  Recent Labs Lab 05/17/14 0735  GLUCAP 93       Signed:  ELMAHI,MUTAZ A  [name] 05/22/2014, 10:43 AM

## 2014-05-22 NOTE — Clinical Social Work Note (Signed)
Pt needs transport home. CSW called [employer] and spoke with [place]. She is arranging transport for pt later today and will notify RN about time.   Derenda Fennel, [place] [phone]

## 2014-05-22 NOTE — Progress Notes (Signed)
ANTICOAGULATION CONSULT NOTE -  Pharmacy Consult for Coumadin Indication: atrial fibrillation  Allergies  Allergen Reactions  . Nitroglycerin Other (See Comments)    CAUSED NUMBNESS ALL OVER  . Doxycycline Itching  . Flexeril [Cyclobenzaprine] Other (See Comments)    Sweating, Lightheaded   . Ibuprofen Other (See Comments)    Increase BP, dizziness, lightheaded  . Tramadol Itching  . Tylenol [Acetaminophen] Other (See Comments)    Pt has Hep C  . Vesicare [Solifenacin] Other (See Comments)    Makes my sweat turn yellow  . Amoxil [Amoxicillin] Nausea Only   Patient Measurements: Height: 5\' 6"  (167.6 cm) Weight: 229 lb 4.5 oz (104 kg) IBW/kg (Calculated) : 59.3  Vital Signs: Temp: 97.8 F (36.6 C) (06/16 0653) Temp src: Oral (06/16 0653) BP: 131/83 mmHg (06/16 0653) Pulse Rate: 75 (06/16 0653)  Labs:  Recent Labs  05/20/14 0525 05/21/14 0413 05/22/14 0444  HGB 14.4 14.4  --   HCT 42.7 42.1  --   PLT 197 208  --   LABPROT 23.4* 22.3* 24.4*  INR 2.16* 2.03* 2.28*  CREATININE 0.82 0.81 0.84   Estimated Creatinine Clearance: 98.7 ml/min (by C-G formula based on Cr of 0.84).  Medical History: Past Medical History  Diagnosis Date  . COPD (chronic obstructive pulmonary disease)     Home O2 3L  . Coronary atherosclerosis of native coronary artery     Cardiac catheterization 07/2012 - LAD 40%; small D2 with 60 to 70% ostial; OM1 30 to 40%; RCA 20%; PL1 40%   . Hypothyroidism   . Hepatitis C   . Pneumonia   . Cirrhosis   . Type 2 diabetes mellitus   . GERD (gastroesophageal reflux disease)   . Headache(784.0)   . Anxiety   . Atrial fibrillation and flutter   . Chronic pain   . PE (pulmonary embolism)   . Chronic anticoagulation   . History of medication noncompliance   . SSS (sick sinus syndrome)     Biotronik pacemaker  . Cardiomyopathy     LVEF 40-45%   Medications:  Prescriptions prior to admission  Medication Sig Dispense Refill  . albuterol  (PROVENTIL HFA;VENTOLIN HFA) 108 (90 BASE) MCG/ACT inhaler Inhale 2 puffs into the lungs every 6 (six) hours as needed for wheezing or shortness of breath.       . budesonide-formoterol (SYMBICORT) 80-4.5 MCG/ACT inhaler Inhale 2 puffs into the lungs 2 (two) times daily.      . furosemide (LASIX) 80 MG tablet Take 160 mg by mouth 2 (two) times daily.      . metoCLOPramide (REGLAN) 10 MG tablet Take 1 tablet (10 mg total) by mouth every 6 (six) hours as needed for nausea.  30 tablet  0  . metoprolol (LOPRESSOR) 50 MG tablet Take 25 mg by mouth 3 (three) times daily.      Marland Kitchen. omeprazole (PRILOSEC) 40 MG capsule Take 40 mg by mouth daily.      Marland Kitchen. oxyCODONE (OXY IR/ROXICODONE) 5 MG immediate release tablet Take 1 tablet (5 mg total) by mouth every 4 (four) hours as needed for severe pain.  12 tablet  0  . QUEtiapine (SEROQUEL) 25 MG tablet Take 50 mg by mouth 2 (two) times daily.      Marland Kitchen. tiotropium (SPIRIVA) 18 MCG inhalation capsule Place 18 mcg into inhaler and inhale daily.      Marland Kitchen. warfarin (COUMADIN) 4 MG tablet Take 4 mg by mouth at bedtime.  Assessment: 48yo obese female on chronic Coumadin PTA for h/o afib.  Home dose is listed above.  INR was subtherapeutic on admission but has now trended up to low end of therapeutic range with dose increases.  Lovenox has been d/c'd due to therapeutic INR.  CBC stable, PLTs OK.    Goal of Therapy:  INR 2-3 Monitor platelets by anticoagulation protocol: Yes   Plan:   Coumadin 5 mg po today x 1 to discourage INR drop  INR daily  Monitor CBC / Platelets  Hall, Scott A 05/22/2014,10:16 AM

## 2014-06-06 ENCOUNTER — Emergency Department (HOSPITAL_COMMUNITY)
Admission: EM | Admit: 2014-06-06 | Discharge: 2014-06-07 | Disposition: A | Discharge status: Home / Self Care | Payer: Medicaid Other | Attending: Emergency Medicine | Admitting: Emergency Medicine

## 2014-06-06 ENCOUNTER — Encounter (HOSPITAL_COMMUNITY): Payer: Self-pay | Admitting: Emergency Medicine

## 2014-06-06 DIAGNOSIS — E119 Type 2 diabetes mellitus without complications: Secondary | ICD-10-CM | POA: Insufficient documentation

## 2014-06-06 DIAGNOSIS — F17200 Nicotine dependence, unspecified, uncomplicated: Secondary | ICD-10-CM | POA: Diagnosis not present

## 2014-06-06 DIAGNOSIS — Z79899 Other long term (current) drug therapy: Secondary | ICD-10-CM | POA: Insufficient documentation

## 2014-06-06 DIAGNOSIS — E876 Hypokalemia: Secondary | ICD-10-CM

## 2014-06-06 DIAGNOSIS — Z880 Allergy status to penicillin: Secondary | ICD-10-CM | POA: Insufficient documentation

## 2014-06-06 DIAGNOSIS — K219 Gastro-esophageal reflux disease without esophagitis: Secondary | ICD-10-CM | POA: Insufficient documentation

## 2014-06-06 DIAGNOSIS — Z86711 Personal history of pulmonary embolism: Secondary | ICD-10-CM | POA: Insufficient documentation

## 2014-06-06 DIAGNOSIS — Z8619 Personal history of other infectious and parasitic diseases: Secondary | ICD-10-CM | POA: Insufficient documentation

## 2014-06-06 DIAGNOSIS — R0600 Dyspnea, unspecified: Secondary | ICD-10-CM

## 2014-06-06 DIAGNOSIS — J441 Chronic obstructive pulmonary disease with (acute) exacerbation: Secondary | ICD-10-CM | POA: Diagnosis not present

## 2014-06-06 DIAGNOSIS — Z7901 Long term (current) use of anticoagulants: Secondary | ICD-10-CM | POA: Insufficient documentation

## 2014-06-06 DIAGNOSIS — Z8639 Personal history of other endocrine, nutritional and metabolic disease: Secondary | ICD-10-CM | POA: Insufficient documentation

## 2014-06-06 DIAGNOSIS — Z950 Presence of cardiac pacemaker: Secondary | ICD-10-CM | POA: Diagnosis not present

## 2014-06-06 DIAGNOSIS — Z9889 Other specified postprocedural states: Secondary | ICD-10-CM | POA: Diagnosis not present

## 2014-06-06 DIAGNOSIS — Z9981 Dependence on supplemental oxygen: Secondary | ICD-10-CM | POA: Insufficient documentation

## 2014-06-06 DIAGNOSIS — Z862 Personal history of diseases of the blood and blood-forming organs and certain disorders involving the immune mechanism: Secondary | ICD-10-CM | POA: Diagnosis not present

## 2014-06-06 DIAGNOSIS — F411 Generalized anxiety disorder: Secondary | ICD-10-CM | POA: Insufficient documentation

## 2014-06-06 DIAGNOSIS — I4891 Unspecified atrial fibrillation: Secondary | ICD-10-CM | POA: Diagnosis not present

## 2014-06-06 DIAGNOSIS — I2510 Atherosclerotic heart disease of native coronary artery without angina pectoris: Secondary | ICD-10-CM | POA: Diagnosis not present

## 2014-06-06 DIAGNOSIS — Z8701 Personal history of pneumonia (recurrent): Secondary | ICD-10-CM | POA: Diagnosis not present

## 2014-06-06 DIAGNOSIS — R0602 Shortness of breath: Secondary | ICD-10-CM | POA: Diagnosis present

## 2014-06-06 DIAGNOSIS — G8929 Other chronic pain: Secondary | ICD-10-CM | POA: Insufficient documentation

## 2014-06-06 NOTE — ED Notes (Signed)
Pt c/o sob and lower extremity swelling x 3 weeks.

## 2014-06-07 ENCOUNTER — Emergency Department (HOSPITAL_COMMUNITY): Payer: Medicaid Other

## 2014-06-07 LAB — COMPREHENSIVE METABOLIC PANEL
ALK PHOS: 87 U/L (ref 39–117)
ALT: 21 U/L (ref 0–35)
ANION GAP: 13 (ref 5–15)
AST: 32 U/L (ref 0–37)
Albumin: 3.7 g/dL (ref 3.5–5.2)
BUN: 4 mg/dL — AB (ref 6–23)
CALCIUM: 9 mg/dL (ref 8.4–10.5)
CO2: 30 mEq/L (ref 19–32)
CREATININE: 0.71 mg/dL (ref 0.50–1.10)
Chloride: 97 mEq/L (ref 96–112)
GFR calc Af Amer: >90 mL/min (ref >90)
GFR calc non Af Amer: >90 mL/min (ref >90)
Glucose, Bld: 97 mg/dL (ref 70–99)
POTASSIUM: 2.7 meq/L — AB (ref 3.7–5.3)
Sodium: 140 mEq/L (ref 137–147)
TOTAL PROTEIN: 8.2 g/dL (ref 6.0–8.3)
Total Bilirubin: 0.8 mg/dL (ref 0.3–1.2)

## 2014-06-07 LAB — PROTIME-INR
INR: 1 (ref 0.00–1.49)
PROTHROMBIN TIME: 13.2 s (ref 11.6–15.2)

## 2014-06-07 LAB — CBC
HEMATOCRIT: 40.9 % (ref 36.0–46.0)
Hemoglobin: 13.9 g/dL (ref 12.0–15.0)
MCH: 34.7 pg — ABNORMAL HIGH (ref 26.0–34.0)
MCHC: 34 g/dL (ref 30.0–36.0)
MCV: 102 fL — ABNORMAL HIGH (ref 78.0–100.0)
Platelets: 241 10*3/uL (ref 150–400)
RBC: 4.01 MIL/uL (ref 3.87–5.11)
RDW: 15.1 % (ref 11.5–15.5)
WBC: 9 10*3/uL (ref 4.0–10.5)

## 2014-06-07 LAB — PRO B NATRIURETIC PEPTIDE: PRO B NATRI PEPTIDE: 1559 pg/mL — AB (ref 0–125)

## 2014-06-07 LAB — TROPONIN I
TROPONIN I: 0.38 ng/mL — AB (ref <0.30)
Troponin I: 0.43 ng/mL (ref <0.30)

## 2014-06-07 MED ORDER — OXYCODONE HCL 5 MG PO TABS
5.0000 mg | ORAL_TABLET | Freq: Once | ORAL | Status: AC
  Administered 2014-06-07: 5 mg via ORAL
  Filled 2014-06-07: qty 1

## 2014-06-07 MED ORDER — IPRATROPIUM-ALBUTEROL 0.5-2.5 (3) MG/3ML IN SOLN
3.0000 mL | Freq: Once | RESPIRATORY_TRACT | Status: AC
  Administered 2014-06-07: 3 mL via RESPIRATORY_TRACT
  Filled 2014-06-07: qty 3

## 2014-06-07 MED ORDER — POTASSIUM CHLORIDE CRYS ER 20 MEQ PO TBCR: 20.0000 meq | EXTENDED_RELEASE_TABLET | Freq: Every day | ORAL | Status: DC

## 2014-06-07 MED ORDER — ALBUTEROL SULFATE (2.5 MG/3ML) 0.083% IN NEBU
2.5000 mg | INHALATION_SOLUTION | Freq: Once | RESPIRATORY_TRACT | Status: AC
  Administered 2014-06-07: 2.5 mg via RESPIRATORY_TRACT
  Filled 2014-06-07: qty 3

## 2014-06-07 MED ORDER — ALBUTEROL SULFATE (2.5 MG/3ML) 0.083% IN NEBU: 5.0000 mg | INHALATION_SOLUTION | Freq: Once | RESPIRATORY_TRACT | Status: DC

## 2014-06-07 MED ORDER — POTASSIUM CHLORIDE CRYS ER 20 MEQ PO TBCR
40.0000 meq | EXTENDED_RELEASE_TABLET | Freq: Once | ORAL | Status: AC
  Administered 2014-06-07: 40 meq via ORAL
  Filled 2014-06-07: qty 2

## 2014-06-07 MED ORDER — IPRATROPIUM BROMIDE 0.02 % IN SOLN: 0.5000 mg | Freq: Once | RESPIRATORY_TRACT | Status: DC

## 2014-06-07 NOTE — ED Notes (Signed)
CRITICAL VALUE ALERT  Critical value received: potassium 2.7;trop 0.38  Date of notification:  06/07/14  Time of notification:  0155   Nurse who received alert:  Bennetta Laos  MD notified (1st page):    Time of first page:   MD notified (2nd page):  Time of second page:  Responding MD: Patria Mane  Time MD responded:  [phone]

## 2014-06-07 NOTE — Discharge Instructions (Signed)

## 2014-06-07 NOTE — ED Provider Notes (Signed)
CSN: [id number]     Arrival date & time 06/06/14  2314 History  This chart was scribed for Lyanne Co, MD by Milly Jakob, ED Scribe. The patient was seen in room APA05/APA05. Patient's care was started at 12:16 AM.     Chief Complaint  Patient presents with  . Shortness of Breath   The history is provided by the patient. No language interpreter was used.   HPI Comments: Kim [name] is a 48 y.o. female with a history of COPD who presents to the Emergency Department complaining of SOB onset 2 weeks ago. She reports foot and leg swelling onset 3 days ago, which worsened tonight. She reports elevating her feet and taking 80mg  Lasix 2x per day with no relief. She reports nausea and vomiting onset 15 days ago. She reports vomiting most days. She states that her pacemaker is activating and feels like a "wave." She denies diarrhea. She reports taking oxycodone daily for chronic back pain, and that she is due for a refill and is currently out  Past Medical History  Diagnosis Date  . COPD (chronic obstructive pulmonary disease)     Home O2 3L  . Coronary atherosclerosis of native coronary artery     Cardiac catheterization 07/2012 - LAD 40%; small D2 with 60 to 70% ostial; OM1 30 to 40%; RCA 20%; PL1 40%   . Hypothyroidism   . Hepatitis C   . Pneumonia   . Cirrhosis   . Type 2 diabetes mellitus   . GERD (gastroesophageal reflux disease)   . Headache(784.0)   . Anxiety   . Atrial fibrillation and flutter   . Chronic pain   . PE (pulmonary embolism)   . Chronic anticoagulation   . History of medication noncompliance   . SSS (sick sinus syndrome)     Biotronik pacemaker  . Cardiomyopathy     LVEF 40-45%   Past Surgical History  Procedure Laterality Date  . Cholecystectomy    . Pacemaker insertion  02/2012    Biotronik PPM   . Cardiac electrophysiology study and ablation    . Esophagogastroduodenoscopy  Jan 2010    Dr. Arlyce Dice: multiple gastric erosions, benign path  .  Esophagogastroduodenoscopy N/A 01/10/2014    WUJ:WJXBJYNW EG junction. Query short segment Barrett's esophagus-status post pinch biopsy as described above. Hiatal hernia; otherwise negative EGD   Family History  Problem Relation Age of Onset  . Coronary artery disease Father 48  . Coronary artery disease Mother 1  . Coronary artery disease Brother   . Colon cancer Neg Hx    History  Substance Use Topics  . Smoking status: Current Every Day Smoker -- 0.50 packs/day for 30 years    Types: Cigarettes  . Smokeless tobacco: Current User    Types: Snuff  . Alcohol Use: No   OB History   Grav Para Term Preterm Abortions TAB SAB Ect Mult Living                 Review of Systems A complete 10 system review of systems was obtained and all systems are negative except as noted in the HPI and PMH.    Allergies  Nitroglycerin; Doxycycline; Flexeril; Ibuprofen; Tramadol; Tylenol; Vesicare; and Amoxil  Home Medications   Prior to Admission medications   Medication Sig Start Date End Date Taking? Authorizing Provider  albuterol (PROVENTIL HFA;VENTOLIN HFA) 108 (90 BASE) MCG/ACT inhaler Inhale 2 puffs into the lungs every 6 (six) hours as needed for wheezing  or shortness of breath.     Historical Provider, MD  budesonide-formoterol (SYMBICORT) 80-4.5 MCG/ACT inhaler Inhale 2 puffs into the lungs 2 (two) times daily.    Historical Provider, MD  magnesium oxide (MAG-OX) 400 (241.3 MG) MG tablet Take 1 tablet (400 mg total) by mouth daily. 05/22/14   Clydia LlanoMutaz Elmahi, MD  metoCLOPramide (REGLAN) 10 MG tablet Take 1 tablet (10 mg total) by mouth every 6 (six) hours as needed for nausea. 05/13/14   Dione Boozeavid Glick, MD  metoprolol (LOPRESSOR) 50 MG tablet Take 25 mg by mouth 3 (three) times daily.    Historical Provider, MD  omeprazole (PRILOSEC) 40 MG capsule Take 40 mg by mouth daily.    Historical Provider, MD  oxyCODONE (OXY IR/ROXICODONE) 5 MG immediate release tablet Take 1 tablet (5 mg total) by mouth  every 4 (four) hours as needed for severe pain. 05/13/14   Dione Boozeavid Glick, MD  potassium chloride SA (K-DUR,KLOR-CON) 20 MEQ tablet Take 1 tablet (20 mEq total) by mouth daily. 05/22/14   Clydia LlanoMutaz Elmahi, MD  potassium chloride SA (K-DUR,KLOR-CON) 20 MEQ tablet Take 1 tablet (20 mEq total) by mouth daily. 06/07/14   Lyanne CoKevin M Dora Simeone, MD  QUEtiapine (SEROQUEL) 25 MG tablet Take 50 mg by mouth 2 (two) times daily. 12/08/13   Historical Provider, MD  tiotropium (SPIRIVA) 18 MCG inhalation capsule Place 18 mcg into inhaler and inhale daily.    Historical Provider, MD  torsemide (DEMADEX) 20 MG tablet Take 2 tablets (40 mg total) by mouth 2 (two) times daily. 05/22/14   Clydia LlanoMutaz Elmahi, MD  warfarin (COUMADIN) 4 MG tablet Take 4 mg by mouth at bedtime.    Historical Provider, MD   Triage Vitals: BP 155/89  Pulse 73  Temp(Src) 98.3 F (36.8 C)  Resp 18  Ht 5\' 6"  (1.676 m)  Wt 237 lb 3 oz (107.588 kg)  BMI 38.30 kg/m2  SpO2 98%  LMP 02/08/2007 Physical Exam  Nursing note and vitals reviewed. Constitutional: She is oriented to person, place, and time. She appears well-developed and well-nourished. No distress.  HENT:  Head: Normocephalic and atraumatic.  Eyes: EOM are normal.  Neck: Normal range of motion.  Cardiovascular: Normal rate, regular rhythm and normal heart sounds.   Pulmonary/Chest: Effort normal. She has wheezes (bilaterally).  Abdominal: Soft. She exhibits no distension. There is no tenderness.  Musculoskeletal: Normal range of motion.  Normal PT and DP pulses bilaterally feet.  1+ pitting edema bilaterally.  Neurological: She is alert and oriented to person, place, and time.  Skin: Skin is warm and dry.  Psychiatric: She has a normal mood and affect. Judgment normal.    ED Course  Procedures (including critical care time) DIAGNOSTIC STUDIES: Oxygen Saturation is 98% on room air, normal by my interpretation.    COORDINATION OF CARE: 12:14 AM-Discussed treatment plan which includes blood  work and CXR with pt at bedside and pt agreed to plan.   Labs Review Labs Reviewed  CBC - Abnormal; Notable for the following:    MCV 102.0 (*)    MCH 34.7 (*)    All other components within normal limits  COMPREHENSIVE METABOLIC PANEL - Abnormal; Notable for the following:    Potassium 2.7 (*)    BUN 4 (*)    All other components within normal limits  TROPONIN I - Abnormal; Notable for the following:    Troponin I 0.38 (*)    All other components within normal limits  PRO B NATRIURETIC PEPTIDE - Abnormal;  Notable for the following:    Pro B Natriuretic peptide (BNP) 1559.0 (*)    All other components within normal limits  TROPONIN I - Abnormal; Notable for the following:    Troponin I 0.43 (*)    All other components within normal limits  PROTIME-INR    Imaging Review Dg Chest 2 View  06/07/2014   CLINICAL DATA:  Shortness of breath  EXAM: CHEST  2 VIEW  COMPARISON:  05/16/2014  FINDINGS: There is a left chest wall pacer device with lead in the right atrial appendage and right ventricle. The heart size and mediastinal contours are within normal limits. Both lungs are clear. The visualized skeletal structures are unremarkable.  IMPRESSION: 1. No acute cardiopulmonary abnormalities.   Electronically Signed   By: Signa Kell M.D.   On: 06/07/2014 01:32    ECG interpretation   Date: 06/07/2014  Rate: 85  Rhythm: normal sinus rhythm  QRS Axis: normal  Intervals: normal  ST/T Wave abnormalities: nonspecific  Conduction Disutrbances: nonspecific IVCD  Narrative Interpretation:   Old EKG Reviewed: No significant changes noted     MDM   Final diagnoses:  Dyspnea  Hypokalemia    The patient feels much better at this time.  Potassium will be replaced orally.  Serial troponin is baseline for the patient without significant changes.  I would not consider this at delta change in her troponin.  Review of her chart demonstrates the patient usually has a troponin of this level.   Please see cardiology consultation note from 05/17/2014 which outlines her cardiac history and her recent heart catheterization.  I doubt this is ischemic cardiac disease.  Doubt acute coronary syndrome.  No significant orthopnea to suggest congestive heart failure.  Chest X. pulmonary edema.  Additional followup with her primary care physician.  Vital signs are normal.  No hypoxia.  I personally performed the services described in this documentation, which was scribed in my presence. The recorded information has been reviewed and is accurate.       Lyanne Co, MD 06/07/14 [phone]

## 2014-06-07 NOTE — ED Notes (Signed)
CRITICAL VALUE ALERT  Critical value received:  Trop 0.43  Date of notification:  06/07/14  Time of notification:  0408  Critical value read back:yes  Nurse who received alert:  Aileen Pilot  MD notified (1st page):    Time of first page:    MD notified (2nd page):  Time of second page:  Responding MD:  Patria Mane  Time MD responded:  [phone]

## 2014-06-12 ENCOUNTER — Encounter: Payer: Self-pay | Admitting: Internal Medicine

## 2014-06-12 ENCOUNTER — Inpatient Hospital Stay (HOSPITAL_COMMUNITY)
Admission: EM | Admit: 2014-06-12 | Discharge: 2014-06-14 | DRG: 292 | Disposition: A | Discharge status: Home Health | Payer: Medicaid Other | Attending: Family Medicine | Admitting: Family Medicine

## 2014-06-12 ENCOUNTER — Emergency Department (HOSPITAL_COMMUNITY): Payer: Medicaid Other

## 2014-06-12 ENCOUNTER — Encounter (HOSPITAL_COMMUNITY): Payer: Self-pay | Admitting: Emergency Medicine

## 2014-06-12 DIAGNOSIS — Z9119 Patient's noncompliance with other medical treatment and regimen: Secondary | ICD-10-CM | POA: Diagnosis not present

## 2014-06-12 DIAGNOSIS — J449 Chronic obstructive pulmonary disease, unspecified: Secondary | ICD-10-CM

## 2014-06-12 DIAGNOSIS — I2789 Other specified pulmonary heart diseases: Secondary | ICD-10-CM | POA: Diagnosis present

## 2014-06-12 DIAGNOSIS — K089 Disorder of teeth and supporting structures, unspecified: Secondary | ICD-10-CM

## 2014-06-12 DIAGNOSIS — E119 Type 2 diabetes mellitus without complications: Secondary | ICD-10-CM | POA: Diagnosis present

## 2014-06-12 DIAGNOSIS — K7460 Unspecified cirrhosis of liver: Secondary | ICD-10-CM | POA: Diagnosis present

## 2014-06-12 DIAGNOSIS — E039 Hypothyroidism, unspecified: Secondary | ICD-10-CM | POA: Diagnosis present

## 2014-06-12 DIAGNOSIS — I5043 Acute on chronic combined systolic (congestive) and diastolic (congestive) heart failure: Secondary | ICD-10-CM

## 2014-06-12 DIAGNOSIS — I4891 Unspecified atrial fibrillation: Secondary | ICD-10-CM

## 2014-06-12 DIAGNOSIS — G8929 Other chronic pain: Secondary | ICD-10-CM | POA: Diagnosis present

## 2014-06-12 DIAGNOSIS — B1920 Unspecified viral hepatitis C without hepatic coma: Secondary | ICD-10-CM | POA: Diagnosis present

## 2014-06-12 DIAGNOSIS — Z720 Tobacco use: Secondary | ICD-10-CM

## 2014-06-12 DIAGNOSIS — I214 Non-ST elevation (NSTEMI) myocardial infarction: Secondary | ICD-10-CM

## 2014-06-12 DIAGNOSIS — Z950 Presence of cardiac pacemaker: Secondary | ICD-10-CM

## 2014-06-12 DIAGNOSIS — I480 Paroxysmal atrial fibrillation: Secondary | ICD-10-CM

## 2014-06-12 DIAGNOSIS — Z8249 Family history of ischemic heart disease and other diseases of the circulatory system: Secondary | ICD-10-CM | POA: Diagnosis not present

## 2014-06-12 DIAGNOSIS — R197 Diarrhea, unspecified: Secondary | ICD-10-CM

## 2014-06-12 DIAGNOSIS — F17200 Nicotine dependence, unspecified, uncomplicated: Secondary | ICD-10-CM | POA: Diagnosis present

## 2014-06-12 DIAGNOSIS — Z79899 Other long term (current) drug therapy: Secondary | ICD-10-CM

## 2014-06-12 DIAGNOSIS — R079 Chest pain, unspecified: Secondary | ICD-10-CM

## 2014-06-12 DIAGNOSIS — R0902 Hypoxemia: Secondary | ICD-10-CM | POA: Diagnosis present

## 2014-06-12 DIAGNOSIS — Z7901 Long term (current) use of anticoagulants: Secondary | ICD-10-CM

## 2014-06-12 DIAGNOSIS — I428 Other cardiomyopathies: Secondary | ICD-10-CM | POA: Diagnosis present

## 2014-06-12 DIAGNOSIS — D72829 Elevated white blood cell count, unspecified: Secondary | ICD-10-CM

## 2014-06-12 DIAGNOSIS — Z9089 Acquired absence of other organs: Secondary | ICD-10-CM

## 2014-06-12 DIAGNOSIS — Z765 Malingerer [conscious simulation]: Secondary | ICD-10-CM

## 2014-06-12 DIAGNOSIS — R0600 Dyspnea, unspecified: Secondary | ICD-10-CM

## 2014-06-12 DIAGNOSIS — J441 Chronic obstructive pulmonary disease with (acute) exacerbation: Secondary | ICD-10-CM

## 2014-06-12 DIAGNOSIS — F1210 Cannabis abuse, uncomplicated: Secondary | ICD-10-CM | POA: Diagnosis present

## 2014-06-12 DIAGNOSIS — I5023 Acute on chronic systolic (congestive) heart failure: Secondary | ICD-10-CM

## 2014-06-12 DIAGNOSIS — I509 Heart failure, unspecified: Secondary | ICD-10-CM | POA: Diagnosis present

## 2014-06-12 DIAGNOSIS — I5033 Acute on chronic diastolic (congestive) heart failure: Principal | ICD-10-CM

## 2014-06-12 DIAGNOSIS — L989 Disorder of the skin and subcutaneous tissue, unspecified: Secondary | ICD-10-CM

## 2014-06-12 DIAGNOSIS — R748 Abnormal levels of other serum enzymes: Secondary | ICD-10-CM | POA: Diagnosis present

## 2014-06-12 DIAGNOSIS — E876 Hypokalemia: Secondary | ICD-10-CM

## 2014-06-12 DIAGNOSIS — J90 Pleural effusion, not elsewhere classified: Secondary | ICD-10-CM

## 2014-06-12 DIAGNOSIS — Z91199 Patient's noncompliance with other medical treatment and regimen due to unspecified reason: Secondary | ICD-10-CM | POA: Diagnosis not present

## 2014-06-12 DIAGNOSIS — I2510 Atherosclerotic heart disease of native coronary artery without angina pectoris: Secondary | ICD-10-CM | POA: Diagnosis present

## 2014-06-12 DIAGNOSIS — Z9981 Dependence on supplemental oxygen: Secondary | ICD-10-CM | POA: Diagnosis not present

## 2014-06-12 DIAGNOSIS — Z86711 Personal history of pulmonary embolism: Secondary | ICD-10-CM

## 2014-06-12 DIAGNOSIS — M79609 Pain in unspecified limb: Secondary | ICD-10-CM | POA: Diagnosis not present

## 2014-06-12 DIAGNOSIS — Z91148 Patient's other noncompliance with medication regimen for other reason: Secondary | ICD-10-CM

## 2014-06-12 DIAGNOSIS — R002 Palpitations: Secondary | ICD-10-CM

## 2014-06-12 DIAGNOSIS — R1310 Dysphagia, unspecified: Secondary | ICD-10-CM

## 2014-06-12 DIAGNOSIS — T380X5A Adverse effect of glucocorticoids and synthetic analogues, initial encounter: Secondary | ICD-10-CM | POA: Diagnosis present

## 2014-06-12 DIAGNOSIS — B182 Chronic viral hepatitis C: Secondary | ICD-10-CM

## 2014-06-12 DIAGNOSIS — K219 Gastro-esophageal reflux disease without esophagitis: Secondary | ICD-10-CM | POA: Diagnosis present

## 2014-06-12 DIAGNOSIS — I5022 Chronic systolic (congestive) heart failure: Secondary | ICD-10-CM

## 2014-06-12 DIAGNOSIS — R109 Unspecified abdominal pain: Secondary | ICD-10-CM

## 2014-06-12 DIAGNOSIS — R0602 Shortness of breath: Secondary | ICD-10-CM

## 2014-06-12 DIAGNOSIS — J189 Pneumonia, unspecified organism: Secondary | ICD-10-CM

## 2014-06-12 DIAGNOSIS — Z9114 Patient's other noncompliance with medication regimen: Secondary | ICD-10-CM

## 2014-06-12 DIAGNOSIS — R7989 Other specified abnormal findings of blood chemistry: Secondary | ICD-10-CM

## 2014-06-12 DIAGNOSIS — F418 Other specified anxiety disorders: Secondary | ICD-10-CM

## 2014-06-12 DIAGNOSIS — R778 Other specified abnormalities of plasma proteins: Secondary | ICD-10-CM | POA: Diagnosis present

## 2014-06-12 DIAGNOSIS — I25119 Atherosclerotic heart disease of native coronary artery with unspecified angina pectoris: Secondary | ICD-10-CM

## 2014-06-12 DIAGNOSIS — R0789 Other chest pain: Secondary | ICD-10-CM

## 2014-06-12 LAB — PROTIME-INR
INR: 1.52 — ABNORMAL HIGH (ref 0.00–1.49)
Prothrombin Time: 18.3 seconds — ABNORMAL HIGH (ref 11.6–15.2)

## 2014-06-12 LAB — BASIC METABOLIC PANEL
ANION GAP: 10 (ref 5–15)
BUN: 7 mg/dL (ref 6–23)
CALCIUM: 8.8 mg/dL (ref 8.4–10.5)
CO2: 30 meq/L (ref 19–32)
CREATININE: 0.82 mg/dL (ref 0.50–1.10)
Chloride: 98 mEq/L (ref 96–112)
GFR calc Af Amer: >90 mL/min (ref >90)
GFR calc non Af Amer: 83 mL/min — ABNORMAL LOW (ref >90)
Glucose, Bld: 83 mg/dL (ref 70–99)
Potassium: 3.8 mEq/L (ref 3.7–5.3)
Sodium: 138 mEq/L (ref 137–147)

## 2014-06-12 LAB — CBC WITH DIFFERENTIAL/PLATELET
BASOS PCT: 0 % (ref 0–1)
Basophils Absolute: 0 10*3/uL (ref 0.0–0.1)
EOS ABS: 0.1 10*3/uL (ref 0.0–0.7)
EOS PCT: 2 % (ref 0–5)
HCT: 39.6 % (ref 36.0–46.0)
Hemoglobin: 13.4 g/dL (ref 12.0–15.0)
Lymphocytes Relative: 42 % (ref 12–46)
Lymphs Abs: 3.1 10*3/uL (ref 0.7–4.0)
MCH: 34.9 pg — AB (ref 26.0–34.0)
MCHC: 33.8 g/dL (ref 30.0–36.0)
MCV: 103.1 fL — ABNORMAL HIGH (ref 78.0–100.0)
Monocytes Absolute: 0.6 10*3/uL (ref 0.1–1.0)
Monocytes Relative: 8 % (ref 3–12)
Neutro Abs: 3.6 10*3/uL (ref 1.7–7.7)
Neutrophils Relative %: 48 % (ref 43–77)
PLATELETS: 201 10*3/uL (ref 150–400)
RBC: 3.84 MIL/uL — ABNORMAL LOW (ref 3.87–5.11)
RDW: 14.9 % (ref 11.5–15.5)
WBC: 7.4 10*3/uL (ref 4.0–10.5)

## 2014-06-12 LAB — PRO B NATRIURETIC PEPTIDE: Pro B Natriuretic peptide (BNP): 1342 pg/mL — ABNORMAL HIGH (ref 0–125)

## 2014-06-12 LAB — TROPONIN I: TROPONIN I: 0.4 ng/mL — AB (ref <0.30)

## 2014-06-12 MED ORDER — MORPHINE SULFATE 4 MG/ML IJ SOLN
4.0000 mg | Freq: Once | INTRAMUSCULAR | Status: AC
  Administered 2014-06-12: 4 mg via INTRAVENOUS
  Filled 2014-06-12: qty 1

## 2014-06-12 MED ORDER — FUROSEMIDE 10 MG/ML IJ SOLN
80.0000 mg | Freq: Once | INTRAMUSCULAR | Status: AC
  Administered 2014-06-12: 80 mg via INTRAVENOUS
  Filled 2014-06-12: qty 8

## 2014-06-12 MED ORDER — ALBUTEROL SULFATE (2.5 MG/3ML) 0.083% IN NEBU
5.0000 mg | INHALATION_SOLUTION | Freq: Once | RESPIRATORY_TRACT | Status: AC
  Administered 2014-06-12: 5 mg via RESPIRATORY_TRACT
  Filled 2014-06-12: qty 6

## 2014-06-12 NOTE — ED Provider Notes (Signed)
CSN: [id number]     Arrival date & time 06/12/14  2010 History   First MD Initiated Contact with Patient 06/12/14 2101     Chief Complaint  Patient presents with  . Shortness of Breath     (Consider location/radiation/quality/duration/timing/severity/associated sxs/prior Treatment) Patient is a 48 y.o. female presenting with shortness of breath. The history is provided by the patient.  Shortness of Breath Severity:  Moderate Associated symptoms: cough and wheezing   Associated symptoms: no abdominal pain, no chest pain, no headaches, no rash and no vomiting    patient complains of shortness of breath over the last 3 weeks. States she is wheezing and has increased swelling or legs. She's also states she has occasional chest pain. She was seen in ER week ago for similar symptoms. Had some extra Lasix at that time. Troponin was mildly elevated, but this is reportedly her baseline due to symptomatic ischemia. Has had nonischemic cath 2 years ago. She states she's had a mildly productive cough. She has no fever. She is on oxygen at home. She states she's been taking her medications. She denies abdominal pain. Patient states that she needs some pain medicine for her legs because her primary care Dr. is out of town I cannot see her for 2 more weeks. Drug database his been reviewed and the only prescriptions the last 6 months her pain medicines have been 2 prescriptions through the ED.  Past Medical History  Diagnosis Date  . COPD (chronic obstructive pulmonary disease)     Home O2 3L  . Coronary atherosclerosis of native coronary artery     Cardiac catheterization 07/2012 - LAD 40%; small D2 with 60 to 70% ostial; OM1 30 to 40%; RCA 20%; PL1 40%   . Hypothyroidism   . Hepatitis C   . Pneumonia   . Cirrhosis   . Type 2 diabetes mellitus   . GERD (gastroesophageal reflux disease)   . Headache(784.0)   . Anxiety   . Atrial fibrillation and flutter   . Chronic pain   . PE (pulmonary embolism)    . Chronic anticoagulation   . History of medication noncompliance   . SSS (sick sinus syndrome)     Biotronik pacemaker  . Cardiomyopathy     LVEF 40-45%   Past Surgical History  Procedure Laterality Date  . Cholecystectomy    . Pacemaker insertion  02/2012    Biotronik PPM   . Cardiac electrophysiology study and ablation    . Esophagogastroduodenoscopy  Jan 2010    Dr. Arlyce Dice: multiple gastric erosions, benign path  . Esophagogastroduodenoscopy N/A 01/10/2014    WUJ:WJXBJYNW EG junction. Query short segment Barrett's esophagus-status post pinch biopsy as described above. Hiatal hernia; otherwise negative EGD   Family History  Problem Relation Age of Onset  . Coronary artery disease Father 47  . Coronary artery disease Mother 64  . Coronary artery disease Brother   . Colon cancer Neg Hx    History  Substance Use Topics  . Smoking status: Current Every Day Smoker -- 0.50 packs/day for 30 years    Types: Cigarettes  . Smokeless tobacco: Current User    Types: Snuff  . Alcohol Use: No   OB History   Grav Para Term Preterm Abortions TAB SAB Ect Mult Living                 Review of Systems  Constitutional: Negative for activity change and appetite change.  Eyes: Negative for pain.  Respiratory: Positive  for cough, shortness of breath and wheezing. Negative for chest tightness.   Cardiovascular: Positive for leg swelling. Negative for chest pain.  Gastrointestinal: Negative for nausea, vomiting, abdominal pain and diarrhea.  Genitourinary: Negative for flank pain.  Musculoskeletal: Negative for back pain and neck stiffness.  Skin: Negative for rash.  Neurological: Negative for weakness, numbness and headaches.  Psychiatric/Behavioral: Negative for behavioral problems.      Allergies  Nitroglycerin; Doxycycline; Flexeril; Ibuprofen; Tramadol; Tylenol; Vesicare; and Amoxil  Home Medications   Prior to Admission medications   Medication Sig Start Date End Date  Taking? Authorizing Provider  albuterol (PROVENTIL HFA;VENTOLIN HFA) 108 (90 BASE) MCG/ACT inhaler Inhale 2 puffs into the lungs every 6 (six) hours as needed for wheezing or shortness of breath.     Historical Provider, MD  budesonide-formoterol (SYMBICORT) 80-4.5 MCG/ACT inhaler Inhale 2 puffs into the lungs 2 (two) times daily.    Historical Provider, MD  magnesium oxide (MAG-OX) 400 (241.3 MG) MG tablet Take 1 tablet (400 mg total) by mouth daily. 05/22/14   Clydia Llano, MD  metoCLOPramide (REGLAN) 10 MG tablet Take 1 tablet (10 mg total) by mouth every 6 (six) hours as needed for nausea. 05/13/14   Dione Booze, MD  metoprolol (LOPRESSOR) 50 MG tablet Take 25 mg by mouth 3 (three) times daily.    Historical Provider, MD  omeprazole (PRILOSEC) 40 MG capsule Take 40 mg by mouth daily.    Historical Provider, MD  oxyCODONE (OXY IR/ROXICODONE) 5 MG immediate release tablet Take 1 tablet (5 mg total) by mouth every 4 (four) hours as needed for severe pain. 05/13/14   Dione Booze, MD  potassium chloride SA (K-DUR,KLOR-CON) 20 MEQ tablet Take 1 tablet (20 mEq total) by mouth daily. 05/22/14   Clydia Llano, MD  potassium chloride SA (K-DUR,KLOR-CON) 20 MEQ tablet Take 1 tablet (20 mEq total) by mouth daily. 06/07/14   Lyanne Co, MD  QUEtiapine (SEROQUEL) 25 MG tablet Take 50 mg by mouth 2 (two) times daily. 12/08/13   Historical Provider, MD  tiotropium (SPIRIVA) 18 MCG inhalation capsule Place 18 mcg into inhaler and inhale daily.    Historical Provider, MD  torsemide (DEMADEX) 20 MG tablet Take 2 tablets (40 mg total) by mouth 2 (two) times daily. 05/22/14   Clydia Llano, MD  warfarin (COUMADIN) 4 MG tablet Take 4 mg by mouth at bedtime.    Historical Provider, MD   BP 164/103  Pulse 85  Temp(Src) 97.7 F (36.5 C) (Oral)  Resp 20  Ht 5\' 6"  (1.676 m)  Wt 223 lb (101.152 kg)  BMI 36.01 kg/m2  SpO2 99%  LMP 02/08/2007 Physical Exam  Nursing note and vitals reviewed. Constitutional: She is oriented  to person, place, and time. She appears well-developed and well-nourished.  HENT:  Head: Normocephalic and atraumatic.  Eyes: EOM are normal. Pupils are equal, round, and reactive to light.  Neck: Normal range of motion. Neck supple.  Cardiovascular: Normal rate, regular rhythm and normal heart sounds.   No murmur heard. Pulmonary/Chest: No respiratory distress. She has wheezes. She has rales.  Diffuse harsh wheezes. Some rales. Also has pseudo-wheezing resolves when patient is not attempting to take her deep breaths.   Abdominal: Soft. Bowel sounds are normal. She exhibits no distension. There is no tenderness. There is no rebound and no guarding.  Musculoskeletal: Normal range of motion. She exhibits edema.  Pitting edema to bilateral lower extremities  Neurological: She is alert and oriented to person, place, and  time. No cranial nerve deficit.  Skin: Skin is warm and dry.  Psychiatric: She has a normal mood and affect. Her speech is normal.    ED Course  Procedures (including critical care time) Labs Review Labs Reviewed  CBC WITH DIFFERENTIAL - Abnormal; Notable for the following:    RBC 3.84 (*)    MCV 103.1 (*)    MCH 34.9 (*)    All other components within normal limits  TROPONIN I - Abnormal; Notable for the following:    Troponin I 0.40 (*)    All other components within normal limits  PRO B NATRIURETIC PEPTIDE - Abnormal; Notable for the following:    Pro B Natriuretic peptide (BNP) 1342.0 (*)    All other components within normal limits  BASIC METABOLIC PANEL - Abnormal; Notable for the following:    GFR calc non Af Amer 83 (*)    All other components within normal limits  PROTIME-INR - Abnormal; Notable for the following:    Prothrombin Time 18.3 (*)    INR 1.52 (*)    All other components within normal limits    Imaging Review Dg Chest 2 View  06/12/2014   CLINICAL DATA:  Shortness of breath.  Chest pain.  EXAM: CHEST  2 VIEW  COMPARISON:  06/07/2014  FINDINGS:  Cardiac pacemaker. Mild cardiac enlargement without vascular congestion. Mild emphysematous changes in the lungs. No focal airspace disease or consolidation. No blunting of costophrenic angles. No pneumothorax. No significant change since prior study.  IMPRESSION: No active cardiopulmonary disease.   Electronically Signed   By: Burman NievesWilliam  Stevens M.D.   On: 06/12/2014 22:06     EKG Interpretation None      Date: 06/12/2014  Rate: 76  Rhythm: baseline artifact.  QRS Axis: normal  Intervals: artifact. unable to see P waves  ST/T Wave abnormalities: nonspecific ST/T changes  Conduction Disutrbances:LVH with repop  Narrative Interpretation:   Old EKG Reviewed: unchanged    MDM   Final diagnoses:  Acute on chronic diastolic congestive heart failure  Elevated troponin I level  Chronic obstructive pulmonary disease, unspecified COPD, unspecified chronic bronchitis type    Patient with shortness of breath. Likely multifactorial due to COPD and CHF. Troponin is elevated, but has been at this level for multiple times. Likely some demand ischemia. Patient is unimproved after breathing treatments. BNP is elevated, but not as high as has been in the past. Will admit to internal medicine    Harrold DonathNathan R. Rubin PayorPickering, MD 06/12/14 2225

## 2014-06-12 NOTE — ED Notes (Signed)
Patient states increased shortness of breath for 2 weeks. Pt states she is "retaining fluid in her feet" patient is A&OX4 respiratory patterns is normal, patients lungs are coarse with mild inspiratory wheezing.

## 2014-06-12 NOTE — ED Notes (Signed)
Sob for 3 weeks, swelling of feet.

## 2014-06-12 NOTE — ED Notes (Signed)
CRITICAL VALUE ALERT  Critical value received:  Troponin 0.40  Date of notification: 06/12/14  Time of notification:  2216  Critical value read back:Yes.    Nurse who received alert:  Carloyn Manner, RN  MD notified (1st page):  Rubin Payor  Time of first page: 2215  MD notified (2nd page):  Time of second page:  Responding MD:  Rubin Payor  Time MD responded:  2215

## 2014-06-13 ENCOUNTER — Encounter (HOSPITAL_COMMUNITY): Payer: Self-pay | Admitting: *Deleted

## 2014-06-13 DIAGNOSIS — J449 Chronic obstructive pulmonary disease, unspecified: Secondary | ICD-10-CM

## 2014-06-13 DIAGNOSIS — R7989 Other specified abnormal findings of blood chemistry: Secondary | ICD-10-CM

## 2014-06-13 DIAGNOSIS — R079 Chest pain, unspecified: Secondary | ICD-10-CM

## 2014-06-13 DIAGNOSIS — J441 Chronic obstructive pulmonary disease with (acute) exacerbation: Secondary | ICD-10-CM

## 2014-06-13 DIAGNOSIS — R0789 Other chest pain: Secondary | ICD-10-CM

## 2014-06-13 DIAGNOSIS — I5033 Acute on chronic diastolic (congestive) heart failure: Principal | ICD-10-CM

## 2014-06-13 DIAGNOSIS — I4891 Unspecified atrial fibrillation: Secondary | ICD-10-CM

## 2014-06-13 DIAGNOSIS — R0600 Dyspnea, unspecified: Secondary | ICD-10-CM | POA: Diagnosis present

## 2014-06-13 DIAGNOSIS — I509 Heart failure, unspecified: Secondary | ICD-10-CM

## 2014-06-13 LAB — CBC
HEMATOCRIT: 41.3 % (ref 36.0–46.0)
Hemoglobin: 13.7 g/dL (ref 12.0–15.0)
MCH: 34.2 pg — AB (ref 26.0–34.0)
MCHC: 33.2 g/dL (ref 30.0–36.0)
MCV: 103 fL — ABNORMAL HIGH (ref 78.0–100.0)
Platelets: 194 10*3/uL (ref 150–400)
RBC: 4.01 MIL/uL (ref 3.87–5.11)
RDW: 14.7 % (ref 11.5–15.5)
WBC: 5.4 10*3/uL (ref 4.0–10.5)

## 2014-06-13 LAB — COMPREHENSIVE METABOLIC PANEL
ALK PHOS: 84 U/L (ref 39–117)
ALT: 19 U/L (ref 0–35)
AST: 31 U/L (ref 0–37)
Albumin: 3.6 g/dL (ref 3.5–5.2)
Anion gap: 13 (ref 5–15)
BUN: 6 mg/dL (ref 6–23)
CALCIUM: 8.5 mg/dL (ref 8.4–10.5)
CO2: 31 mEq/L (ref 19–32)
Chloride: 97 mEq/L (ref 96–112)
Creatinine, Ser: 0.8 mg/dL (ref 0.50–1.10)
GFR calc non Af Amer: 85 mL/min — ABNORMAL LOW (ref >90)
GLUCOSE: 152 mg/dL — AB (ref 70–99)
Potassium: 3.6 mEq/L — ABNORMAL LOW (ref 3.7–5.3)
Sodium: 141 mEq/L (ref 137–147)
TOTAL PROTEIN: 8 g/dL (ref 6.0–8.3)
Total Bilirubin: 1 mg/dL (ref 0.3–1.2)

## 2014-06-13 LAB — TROPONIN I
TROPONIN I: 0.33 ng/mL — AB (ref <0.30)
TROPONIN I: 0.32 ng/mL — AB (ref <0.30)
Troponin I: 0.36 ng/mL (ref <0.30)

## 2014-06-13 LAB — RAPID URINE DRUG SCREEN, HOSP PERFORMED
AMPHETAMINES: NOT DETECTED
Barbiturates: NOT DETECTED
Benzodiazepines: POSITIVE — AB
Cocaine: NOT DETECTED
Opiates: POSITIVE — AB
Tetrahydrocannabinol: POSITIVE — AB

## 2014-06-13 MED ORDER — PREDNISONE 20 MG PO TABS
40.0000 mg | ORAL_TABLET | Freq: Every day | ORAL | Status: DC
  Administered 2014-06-14: 40 mg via ORAL
  Filled 2014-06-13: qty 2

## 2014-06-13 MED ORDER — MORPHINE SULFATE 2 MG/ML IJ SOLN
2.0000 mg | INTRAMUSCULAR | Status: DC | PRN
  Administered 2014-06-13: 2 mg via INTRAVENOUS
  Filled 2014-06-13: qty 1

## 2014-06-13 MED ORDER — MOMETASONE FURO-FORMOTEROL FUM 100-5 MCG/ACT IN AERO
2.0000 | INHALATION_SPRAY | Freq: Two times a day (BID) | RESPIRATORY_TRACT | Status: DC
  Filled 2014-06-13 (×17): qty 8.8

## 2014-06-13 MED ORDER — WARFARIN SODIUM 5 MG PO TABS
5.0000 mg | ORAL_TABLET | Freq: Once | ORAL | Status: AC
  Administered 2014-06-13: 5 mg via ORAL
  Filled 2014-06-13: qty 1

## 2014-06-13 MED ORDER — IPRATROPIUM BROMIDE 0.02 % IN SOLN: 0.5000 mg | Freq: Four times a day (QID) | RESPIRATORY_TRACT | Status: DC

## 2014-06-13 MED ORDER — SODIUM CHLORIDE 0.9 % IJ SOLN: 3.0000 mL | INTRAMUSCULAR | Status: DC | PRN

## 2014-06-13 MED ORDER — SODIUM CHLORIDE 0.9 % IJ SOLN
3.0000 mL | Freq: Two times a day (BID) | INTRAMUSCULAR | Status: DC
  Administered 2014-06-13 (×2): 3 mL via INTRAVENOUS
  Administered 2014-06-14: 10 mL via INTRAVENOUS

## 2014-06-13 MED ORDER — SODIUM CHLORIDE 0.9 % IV SOLN: 250.0000 mL | INTRAVENOUS | Status: DC | PRN

## 2014-06-13 MED ORDER — ONDANSETRON HCL 4 MG PO TABS: 4.0000 mg | ORAL_TABLET | Freq: Four times a day (QID) | ORAL | Status: DC | PRN

## 2014-06-13 MED ORDER — REGADENOSON 0.4 MG/5ML IV SOLN
0.4000 mg | Freq: Once | INTRAVENOUS | Status: AC
  Administered 2014-06-14: 0.4 mg via INTRAVENOUS
  Filled 2014-06-13: qty 5

## 2014-06-13 MED ORDER — METOCLOPRAMIDE HCL 10 MG PO TABS: 10.0000 mg | ORAL_TABLET | Freq: Four times a day (QID) | ORAL | Status: DC | PRN

## 2014-06-13 MED ORDER — ALBUTEROL SULFATE (2.5 MG/3ML) 0.083% IN NEBU: 2.5000 mg | INHALATION_SOLUTION | Freq: Four times a day (QID) | RESPIRATORY_TRACT | Status: DC

## 2014-06-13 MED ORDER — POTASSIUM CHLORIDE CRYS ER 20 MEQ PO TBCR
20.0000 meq | EXTENDED_RELEASE_TABLET | Freq: Every day | ORAL | Status: DC
  Administered 2014-06-13 – 2014-06-14 (×2): 20 meq via ORAL
  Filled 2014-06-13 (×2): qty 1

## 2014-06-13 MED ORDER — QUETIAPINE FUMARATE 25 MG PO TABS
50.0000 mg | ORAL_TABLET | Freq: Two times a day (BID) | ORAL | Status: DC
  Administered 2014-06-13 – 2014-06-14 (×4): 50 mg via ORAL
  Filled 2014-06-13 (×4): qty 2

## 2014-06-13 MED ORDER — OXYCODONE HCL 5 MG PO TABS
5.0000 mg | ORAL_TABLET | ORAL | Status: DC | PRN
  Administered 2014-06-13 (×2): 5 mg via ORAL
  Filled 2014-06-13 (×2): qty 1

## 2014-06-13 MED ORDER — ONDANSETRON HCL 4 MG/2ML IJ SOLN: 4.0000 mg | Freq: Four times a day (QID) | INTRAMUSCULAR | Status: DC | PRN

## 2014-06-13 MED ORDER — IPRATROPIUM-ALBUTEROL 0.5-2.5 (3) MG/3ML IN SOLN
3.0000 mL | Freq: Four times a day (QID) | RESPIRATORY_TRACT | Status: DC
  Administered 2014-06-13 – 2014-06-14 (×6): 3 mL via RESPIRATORY_TRACT
  Filled 2014-06-13 (×7): qty 3

## 2014-06-13 MED ORDER — FUROSEMIDE 10 MG/ML IJ SOLN
20.0000 mg | Freq: Two times a day (BID) | INTRAMUSCULAR | Status: DC
  Administered 2014-06-13: 20 mg via INTRAVENOUS
  Filled 2014-06-13: qty 2

## 2014-06-13 MED ORDER — ALBUTEROL SULFATE (2.5 MG/3ML) 0.083% IN NEBU: 2.5000 mg | INHALATION_SOLUTION | RESPIRATORY_TRACT | Status: DC | PRN

## 2014-06-13 MED ORDER — METHYLPREDNISOLONE SODIUM SUCC 125 MG IJ SOLR
60.0000 mg | Freq: Four times a day (QID) | INTRAMUSCULAR | Status: DC
  Administered 2014-06-13 (×3): 60 mg via INTRAVENOUS
  Filled 2014-06-13 (×3): qty 2

## 2014-06-13 MED ORDER — METOPROLOL SUCCINATE ER 25 MG PO TB24
25.0000 mg | ORAL_TABLET | Freq: Every day | ORAL | Status: DC
  Administered 2014-06-13 – 2014-06-14 (×2): 25 mg via ORAL
  Filled 2014-06-13 (×2): qty 1

## 2014-06-13 MED ORDER — FUROSEMIDE 10 MG/ML IJ SOLN
40.0000 mg | Freq: Two times a day (BID) | INTRAMUSCULAR | Status: DC
  Administered 2014-06-13: 40 mg via INTRAVENOUS
  Filled 2014-06-13: qty 4

## 2014-06-13 MED ORDER — MOMETASONE FURO-FORMOTEROL FUM 100-5 MCG/ACT IN AERO
2.0000 | INHALATION_SPRAY | Freq: Two times a day (BID) | RESPIRATORY_TRACT | Status: DC
  Administered 2014-06-13 – 2014-06-14 (×2): 2 via RESPIRATORY_TRACT
  Filled 2014-06-13: qty 8.8

## 2014-06-13 MED ORDER — SODIUM CHLORIDE 0.9 % IJ SOLN
3.0000 mL | Freq: Two times a day (BID) | INTRAMUSCULAR | Status: DC
  Administered 2014-06-13 (×2): 3 mL via INTRAVENOUS

## 2014-06-13 MED ORDER — MORPHINE SULFATE 2 MG/ML IJ SOLN
1.0000 mg | INTRAMUSCULAR | Status: DC | PRN
  Administered 2014-06-13: 1 mg via INTRAVENOUS
  Filled 2014-06-13: qty 1

## 2014-06-13 MED ORDER — WARFARIN - PHARMACIST DOSING INPATIENT
Status: DC
  Administered 2014-06-13 – 2014-06-14 (×2)

## 2014-06-13 NOTE — Consult Note (Signed)
CARDIOLOGY CONSULT NOTE   Patient ID: Kim [name] MRN: [id number] DOB/AGE: July 15, 1966 48 y.o.  Admit Date: 06/12/2014 Referring Physician: PTH Primary Physician: Dorian Heckle, MD Consulting Cardiologist: [name] MD Primary Cardiologist: [name] MD  Reason for Consultation: Elevated Troponin  Clinical Summary Kim [name] is a 48 y.o.female with known history of non-obstructive CAD per cath in August of 2013, recent admission for systolic CHF, and elevated troponin's due to demand ischemia at that time. Other history includes Hypothyroidism; Hepatitis C; Pneumonia; Cirrhosis; Type 2 diabetes mellitus; GERD  Headache Anxiety; Atrial fibrillation and flutter; Chronic pain; PE chronic anticoagulation; History of medication noncompliance; SSS (Biotronic PMK in situ) NICM with grade II diastolic dysfunction.   She returned 3 weeks after discharge, with same symptoms of LEE and chest discomfort with again, mildly elevated troponin, 040, 0.36, 033.  Pro-BNP 1,342. CXR with no active disease. EKG Sinus rhythm, RBBB.  She states she has been moving from [place] to [place], eating fast food and snacks. She denies medical non-compliance concerning lasix. She states that she has not been getting good results from the medication, with very little urine out after taking it.  She has not yet followed up with cardiology as OP.   In ER BP 164/103 HR 85. Troponin 0.40, Pro-BNP 1,342, INR 1.52. Did not check D-Dimer with history of PE with subtherapeutic INR. Treated with albuterol inhalers, morphine and lasix. Wt is up from admission but doubt accuracy. Pharmacy is managing coumadin dosing. She states that [facility] from [facility] is checking INR at home. She is on opiates at home for chronic pain. No I/O completed since admission on lasix,  Allergies  Allergen Reactions  . Nitroglycerin Other (See Comments)    CAUSED NUMBNESS ALL OVER  . Doxycycline Itching  . Flexeril  [Cyclobenzaprine] Other (See Comments)    Sweating, Lightheaded   . Ibuprofen Other (See Comments)    Increase BP, dizziness, lightheaded  . Tramadol Itching  . Tylenol [Acetaminophen] Other (See Comments)    Pt has Hep C  . Vesicare [Solifenacin] Other (See Comments)    Makes my sweat turn yellow  . Amoxil [Amoxicillin] Nausea Only    Medications Scheduled Medications: . furosemide  20 mg Intravenous BID  . ipratropium-albuterol  3 mL Nebulization Q6H  . methylPREDNISolone (SOLU-MEDROL) injection  60 mg Intravenous 4 times per day  . mometasone-formoterol  2 puff Inhalation BID  . potassium chloride SA  20 mEq Oral Daily  . QUEtiapine  50 mg Oral BID  . sodium chloride  3 mL Intravenous Q12H  . sodium chloride  3 mL Intravenous Q12H  . warfarin  5 mg Oral Once  . Warfarin - Pharmacist Dosing Inpatient   Does not apply Q24H    Infusions:    PRN Medications: sodium chloride, albuterol, metoCLOPramide, morphine injection, ondansetron (ZOFRAN) IV, ondansetron, oxyCODONE, sodium chloride   Past Medical History  Diagnosis Date  . COPD (chronic obstructive pulmonary disease)     Home O2 3L  . Coronary atherosclerosis of native coronary artery     Cardiac catheterization 07/2012 - LAD 40%; small D2 with 60 to 70% ostial; OM1 30 to 40%; RCA 20%; PL1 40%   . Hypothyroidism   . Hepatitis C   . Pneumonia   . Cirrhosis   . Type 2 diabetes mellitus   . GERD (gastroesophageal reflux disease)   . Headache(784.0)   . Anxiety   . Atrial fibrillation and flutter   .  Chronic pain   . PE (pulmonary embolism)   . Chronic anticoagulation   . History of medication noncompliance   . SSS (sick sinus syndrome)     Biotronik pacemaker  . Cardiomyopathy     LVEF 40-45%    Past Surgical History  Procedure Laterality Date  . Cholecystectomy    . Pacemaker insertion  02/2012    Biotronik PPM   . Cardiac electrophysiology study and ablation    . Esophagogastroduodenoscopy  Jan 2010     Dr. Arlyce DiceKaplan: multiple gastric erosions, benign path  . Esophagogastroduodenoscopy N/A 01/10/2014    VWU:[id number]:Patulous EG junction. Query short segment Barrett's esophagus-status post pinch biopsy as described above. Hiatal hernia; otherwise negative EGD    Family History  Problem Relation Age of Onset  . Coronary artery disease Father 2863  . Coronary artery disease Mother [phone]  . Coronary artery disease Brother   . Colon cancer Neg Hx     Social History Kim [name] reports that she has been smoking Cigarettes.  She has a 15 pack-year smoking history. Her smokeless tobacco use includes Snuff. Kim [name] reports that she does not drink alcohol.  Review of Systems Otherwise reviewed and negative except as outlined.  Physical Examination Blood pressure 131/88, pulse 73, temperature 97.7 F (36.5 C), temperature source Oral, resp. rate 18, height 5\' 5"  (1.651 m), weight 230 lb 6.1 oz (104.5 kg), last menstrual period 02/08/2007, SpO2 94.00%. No intake or output data in the 24 hours ending 06/13/14 1004  Telemetry:SR with RBBB, high rates over night into the low 100;s.   WGN:[id number]:Letharagic, sleepy, difficult memory HEENT: Conjunctiva and lids normal, oropharynx clear with moist mucosa. Neck: Supple, no elevated JVP or carotid bruits, no thyromegaly. Lungs: Diminished in the bases, mild wheezing. Cardiac: Regular rate and rhythm, no S3 or significant systolic murmur, no pericardial rub. Abdomen: Soft, nontender, no hepatomegaly, bowel sounds present, no guarding or rebound. Extremities: 1+ pitting edema, distal pulses 2+.Pain with palpation.  Skin: Warm and dry. Musculoskeletal: No kyphosis. Neuropsychiatric: Alert and sedated  Prior Cardiac Testing/Procedures 1. Cardiac Cath 07/22/2012  Left main: Normal  LAD: Large vessel. There is a curly-Q in the mid-vessel which has associated calcification and about 40% stenosis. Mild plaque distally. 2 small diagonals In ostium of small D2 about 60-70% ostial  stenosis.  LCX: Made up primarily of large OM-1 with 30-40% mid stenosis  RCA: Very large dominant vessel. 20% mid stenosis. PL1 with 40% proximal stenosis  LV-gram done in the RAO projection: Ejection fraction = Not well opacified, EF ~40% with apical dyskinesis  Assessment:  1. Mild non-obstructive CAD  2. Apparent mild-moderate LV dysfunction with apical dyskinesis  Plan/Discussion:  Based on coronary angiography CP does not appear ischemic in nature. She does appear to have a least a mild degree of LV dysfunction. Would get echo to further evaluate. Can be discharged home in am with outpatient f/u from our standpoint.   2. Echocardiogram 05/17/2014 Left ventricle: The cavity size was normal. There was mild focal basal hypertrophy of the septum. Systolic function was normal. The estimated ejection fraction was in the range of 55% to 60%. Features are consistent with a pseudonormal left ventricular filling pattern, with concomitant abnormal relaxation and increased filling pressure (grade 2 diastolic dysfunction). Doppler parameters are consistent with elevated mean left atrial filling pressure. - Ventricular septum: Septal motion showed abnormal function and dyssynergy. - Mitral valve: Calcified annulus. There was mild regurgitation. - Left atrium: The atrium was moderately dilated. -  Right ventricle: Pacer wire or catheter noted in right ventricle. Systolic function was mildly reduced. - Right atrium: Central venous pressure (est): 8 mm Hg. - Tricuspid valve: There was mild regurgitation. - Pulmonary arteries: Systolic pressure could not be accurately estimated. - Pericardium, extracardiac: A prominent pericardial fat pad was present. A trivial pericardial effusion was identified.  3. Pacemaker Interrogation 05/18/2014 Biotronic Pacemaker check in patient @ [employer] by industry. Normal device function. Thresholds, sensing, impedances consistent with previous measurements. Device  programmed to maximize longevity. 11 high ventricular rates noted. Device programmed at appropriate safety margins. Histogram distribution appropriate for patient activity level. Device programmed to optimize intrinsic conduction. Estimated longevity 66 months. Patient education completed.   Lab Results  Basic Metabolic Panel:  Recent Labs Lab 06/07/14 0105 06/12/14 2137 06/13/14 0605  NA 140 138 141  K 2.7* 3.8 3.6*  CL 97 98 97  CO2 30 30 31   GLUCOSE 97 83 152*  BUN 4* 7 6  CREATININE 0.71 0.82 0.80  CALCIUM 9.0 8.8 8.5    Liver Function Tests:  Recent Labs Lab 06/07/14 0105 06/13/14 0605  AST 32 31  ALT 21 19  ALKPHOS 87 84  BILITOT 0.8 1.0  PROT 8.2 8.0  ALBUMIN 3.7 3.6    CBC:  Recent Labs Lab 06/07/14 0105 06/12/14 2137 06/13/14 0605  WBC 9.0 7.4 5.4  NEUTROABS  --  3.6  --   HGB 13.9 13.4 13.7  HCT 40.9 39.6 41.3  MCV 102.0* 103.1* 103.0*  PLT 241 201 194    Cardiac Enzymes:  Recent Labs Lab 06/07/14 0105 06/07/14 0330 06/12/14 2137 06/13/14 0113 06/13/14 0605  TROPONINI 0.38* 0.43* 0.40* 0.36* 0.33*    BNP: 1.342  Radiology: Dg Chest 2 View  06/12/2014   CLINICAL DATA:  Shortness of breath.  Chest pain.  EXAM: CHEST  2 VIEW  COMPARISON:  06/07/2014  FINDINGS: Cardiac pacemaker. Mild cardiac enlargement without vascular congestion. Mild emphysematous changes in the lungs. No focal airspace disease or consolidation. No blunting of costophrenic angles. No pneumothorax. No significant change since prior study.  IMPRESSION: No active cardiopulmonary disease.   Electronically Signed   By: Burman Nieves M.D.   On: 06/12/2014 22:06     ECG:SR with RBBB 76 bpm.   Impression and Recommendations  1.Chest Pain: Chronic pain, on home narcotic's. Question drug seeking behavior. Troponin is chronically elevated. Can consider repeat stress test to evaluate for progressive ischemia with most recent cath 2013 with non-obstructive disease, but small  D2 about 60-70% ostial stenosis and OM-1 with 30-40% mid stenosis per cath in 2013.   2. Acute on Chronic Diastolic  CHF: Most recent echo in June 2015 demonstrated normal LV fx with grade II diastolic dysfunction, mildly reduced RV fx, no comment on RV size, but CVP 8 mg, PA pressure could not be accurately estimated.  She is on IV lasix with no reported urine output since admission. I will order strict I/O and daily wts. Creatinine 0.80.   3. COPD: Chronic.   4. Atrial fib: Heart rate is not well controlled with telemetry having episodes of rapid HR. Will reinstitute BB therapy.   5.Pacemaker in situ: Biotronic interrogated during last hospitalization 3 weeks ago. Noted to have had 11 episodes of high HR at that time. Functioning appropriately.   Signed: Bettey Mare. Lawrence NP  06/13/2014, 10:04 AM Co-Sign MD  Patient seen and discussed with NP Lyman Bishop. 48 yo female hx of chronic systolic heart failure with echo  05/2014 LVEF 55-60% and grade II diastolic dysfunction, afib on coumadin, biotronic pacemaker,medication non-compliance, chronic pain, COPD admitted with leg swelling, SOB, and chest pain. Mild trop elevation 0.43 as peak that is trending down, she has previously had mild trop leaks during admissions with volume overload. EKG sinus rhythm with V-pacing. Cath 2013 with mild non-obstructive disease (LM normal, LAD 40% mid, D2 60-70%, LCX OM1 30-40%, RCA PL1 40%). CXR with no acute process. Pro-BNP 1342. She reports to me her chest pain is worst with movement and deep breathing. She asks me for pain medication during our interview for pain in her feet, however appears very groggy and is difficult to keep awake. Elevated rates on telemetry at times to 140s, aflutter.  Overall her management has been complicated by medical non-compliance with multiple repeat admissions with volume overload. In setting of non-compliance it is difficult to assess if she may have a new process causing her to have  recurrent heart failure exacerbations such as progression of her CAD or if it is just related to not taking medication at home. Her chest pain remains atypical, worst with movement. Despite these difficulties, I think we have to consider non-invasive ischemic testing given her episodic mild troponin elevations, recurrent admissions with heart failure, recurrent chest pain, and known mild to moderate CAD from 2 years ago. Will plan for Lexiscan in AM. Continue diuresis today, start beta blocker for rate control, will start Toprol XL in hopes once daily dosing will increase compliance.   [name] MD

## 2014-06-13 NOTE — Progress Notes (Signed)
Patient seen, independently examined and chart reviewed. I agree with exam, assessment and plan discussed with Toya Smothers, NP.  48 year old woman with history of chronic diastolic heart failure, multiple other chronic medical problems who presented with complaint of bilateral lower extremity edema, difficulty walking, shortness of breath and chest pain. Initial evaluation revealed elevated troponin which was thought to be chronic in nature, acute on chronic heart failure, COPD exacerbation.  Recent hospitalization and discharge 6/16, treated for acute on chronic diastolic heart failure.  PMH Systolic/diastolic CHF, however most recent echocardiogram with LVEF 55-60%. Grade 2 diastolic dysfunction. 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  Objective: Afebrile, vital signs are stable. Stable hypoxia on 2 L.  Gen. Appears calm and comfortable. Sleeping as I enter the room. Alert immediately to voice.  Psych. Alert. Follows simple commands appropriately. Grossly normal dentition.  Cardiovascular. Regular rate and rhythm. No murmur, rub or gallop. 1+ bilateral lower extremity edema. Telemetry paced rhythm.  Respiratory. Clear auscultation bilaterally. No wheezes, rales or rhonchi. Normal respiratory effort   I/O: not recorded  Chemistry: Potassium 3.6. Chemistry panel is unremarkable.   Heme: INR 1.52.  Other: Troponins 0.32-0.40   Imaging: No acute disease.  Acute on chronic diastolic congestive heart failure. Appears to be improving. Continue diuresis per cardiology. Elevated troponin, chronic. Appears to be stable. Further evaluation per cardiology including nuclear stress testing planned. Chest pain. Chronic per cardiology. A question of drug-seeking behavior was raised.  Appears stable at this point. COPD exacerbation? Her COPD appears to be at baseline this point. Continue chronic medications, bronchodilators. Change to oral steroids. Atrial fibrillation. Stable, currently in paced rhythm.  Anticipate discharge home with nuclear stress testing unremarkable.  Pharmacy to manage warfarin.  Brendia Sacks, MD [name] [phone]

## 2014-06-13 NOTE — Care Management Note (Unsigned)
    Page 1 of 1   06/13/2014     3:22:39 PM CARE MANAGEMENT NOTE 06/13/2014  Patient:  Kim [name], Kim [name]   Account Number:  [id number]  Date Initiated:  06/13/2014  Documentation initiated by:  Anibal Henderson  Subjective/Objective Assessment:   Admitted with COPD. Pt is from home, is independent, and will return home at D/C     Action/Plan:   Will monitor for needs   Anticipated DC Date:  06/14/2014   Anticipated DC Plan:  HOME/SELF CARE      DC Planning Services  CM consult      Choice offered to / List presented to:             Status of service:  In process, will continue to follow Medicare Important Message given?   (If response is "NO", the following Medicare IM given date fields will be blank) Date Medicare IM given:   Medicare IM given by:   Date Additional Medicare IM given:   Additional Medicare IM given by:    Discharge Disposition:    Per UR Regulation:  Reviewed for med. necessity/level of care/duration of stay  If discussed at Long Length of Stay Meetings, dates discussed:    Comments:  06/13/14/1500 Anibal Henderson RN/CM

## 2014-06-13 NOTE — Care Management Utilization Note (Signed)
UR completed 

## 2014-06-13 NOTE — Progress Notes (Signed)
ANTICOAGULATION CONSULT NOTE - Initial Consult  Pharmacy Consult for Coumadin (chronic Rx PTA) Indication: Atrial Fibrillation, H/O PE  Allergies  Allergen Reactions  . Nitroglycerin Other (See Comments)    CAUSED NUMBNESS ALL OVER  . Doxycycline Itching  . Flexeril [Cyclobenzaprine] Other (See Comments)    Sweating, Lightheaded   . Ibuprofen Other (See Comments)    Increase BP, dizziness, lightheaded  . Tramadol Itching  . Tylenol [Acetaminophen] Other (See Comments)    Pt has Hep C  . Vesicare [Solifenacin] Other (See Comments)    Makes my sweat turn yellow  . Amoxil [Amoxicillin] Nausea Only   Patient Measurements: Height: 5\' 5"  (165.1 cm) Weight: 230 lb 6.1 oz (104.5 kg) IBW/kg (Calculated) : 57  Vital Signs: Temp: 97.7 F (36.5 C) (07/08 0355) Temp src: Oral (07/08 0355) BP: 131/88 mmHg (07/08 0355) Pulse Rate: 73 (07/08 0355)  Labs:  Recent Labs  06/12/14 2137 06/13/14 0113 06/13/14 0605  HGB 13.4  --  13.7  HCT 39.6  --  41.3  PLT 201  --  194  LABPROT 18.3*  --   --   INR 1.52*  --   --   CREATININE 0.82  --  0.80  TROPONINI 0.40* 0.36* 0.33*   Estimated Creatinine Clearance: 102.1 ml/min (by C-G formula based on Cr of 0.8).  Medical History: Past Medical History  Diagnosis Date  . COPD (chronic obstructive pulmonary disease)     Home O2 3L  . Coronary atherosclerosis of native coronary artery     Cardiac catheterization 07/2012 - LAD 40%; small D2 with 60 to 70% ostial; OM1 30 to 40%; RCA 20%; PL1 40%   . Hypothyroidism   . Hepatitis C   . Pneumonia   . Cirrhosis   . Type 2 diabetes mellitus   . GERD (gastroesophageal reflux disease)   . Headache(784.0)   . Anxiety   . Atrial fibrillation and flutter   . Chronic pain   . PE (pulmonary embolism)   . Chronic anticoagulation   . History of medication noncompliance   . SSS (sick sinus syndrome)     Biotronik pacemaker  . Cardiomyopathy     LVEF 40-45%   Medications:  Prescriptions prior  to admission  Medication Sig Dispense Refill  . albuterol (PROVENTIL HFA;VENTOLIN HFA) 108 (90 BASE) MCG/ACT inhaler Inhale 2 puffs into the lungs every 6 (six) hours as needed for wheezing or shortness of breath.       . budesonide-formoterol (SYMBICORT) 80-4.5 MCG/ACT inhaler Inhale 2 puffs into the lungs 2 (two) times daily.      . metoCLOPramide (REGLAN) 10 MG tablet Take 1 tablet (10 mg total) by mouth every 6 (six) hours as needed for nausea.  30 tablet  0  . omeprazole (PRILOSEC) 40 MG capsule Take 40 mg by mouth daily.      Marland Kitchen oxyCODONE (OXY IR/ROXICODONE) 5 MG immediate release tablet Take 1 tablet (5 mg total) by mouth every 4 (four) hours as needed for severe pain.  12 tablet  0  . potassium chloride SA (K-DUR,KLOR-CON) 20 MEQ tablet Take 1 tablet (20 mEq total) by mouth daily.  7 tablet  0  . QUEtiapine (SEROQUEL) 25 MG tablet Take 50 mg by mouth 2 (two) times daily.      Marland Kitchen tiotropium (SPIRIVA) 18 MCG inhalation capsule Place 18 mcg into inhaler and inhale daily.      Marland Kitchen torsemide (DEMADEX) 20 MG tablet Take 2 tablets (40 mg total) by mouth  2 (two) times daily.  120 tablet  0  . warfarin (COUMADIN) 4 MG tablet Take 4 mg by mouth at bedtime.        Assessment: 48yo female on chronic Coumadin PTA for h/o afib and PE.  Home dose is listed above.  INR is subtherapeutic on admission.    Goal of Therapy:  INR 2-3 Monitor platelets by anticoagulation protocol: Yes   Plan:  Coumadin 5mg  po today x 1 (dose increase) INR daily  Aloria Looper A 06/13/2014,7:51 AM

## 2014-06-13 NOTE — H&P (Signed)
PCP:   Dorian HeckleGILLIAM, RYAN D, MD   Chief Complaint:  Leg swelling  HPI: 48 year old female who   has a past medical history of COPD (chronic obstructive pulmonary disease); Coronary atherosclerosis of native coronary artery; Hypothyroidism; Hepatitis C; Pneumonia; Cirrhosis; Type 2 diabetes mellitus; GERD (gastroesophageal reflux disease); Headache(784.0); Anxiety; Atrial fibrillation and flutter; Chronic pain; PE (pulmonary embolism); Chronic anticoagulation; History of medication noncompliance; SSS (sick sinus syndrome); and Cardiomyopathy. Patient was recently discharged from the hospital, at bedtime she had elevated troponin which was thought to be secondary to demand ischemia. The patient came to hospital with a chief complaint of swelling of the lower extremities, with difficulty walking on her feet. She also complains of twisting shortness of breath and chest pain. Patient has a history of systolic congestive heart failure and has been taking torsemide at home. Patient also has history of A. fib and is currently on anticoagulation with Coumadin. Patient complains of chest pain which has been intermittent,  She admits to having nausea but no vomiting. In the ED patient again felt to have elevated troponin 0.40, EKG shows normal sinus rhythm.  Allergies:   Allergies  Allergen Reactions  . Nitroglycerin Other (See Comments)    CAUSED NUMBNESS ALL OVER  . Doxycycline Itching  . Flexeril [Cyclobenzaprine] Other (See Comments)    Sweating, Lightheaded   . Ibuprofen Other (See Comments)    Increase BP, dizziness, lightheaded  . Tramadol Itching  . Tylenol [Acetaminophen] Other (See Comments)    Pt has Hep C  . Vesicare [Solifenacin] Other (See Comments)    Makes my sweat turn yellow  . Amoxil [Amoxicillin] Nausea Only      Past Medical History  Diagnosis Date  . COPD (chronic obstructive pulmonary disease)     Home O2 3L  . Coronary atherosclerosis of native coronary artery    Cardiac catheterization 07/2012 - LAD 40%; small D2 with 60 to 70% ostial; OM1 30 to 40%; RCA 20%; PL1 40%   . Hypothyroidism   . Hepatitis C   . Pneumonia   . Cirrhosis   . Type 2 diabetes mellitus   . GERD (gastroesophageal reflux disease)   . Headache(784.0)   . Anxiety   . Atrial fibrillation and flutter   . Chronic pain   . PE (pulmonary embolism)   . Chronic anticoagulation   . History of medication noncompliance   . SSS (sick sinus syndrome)     Biotronik pacemaker  . Cardiomyopathy     LVEF 40-45%    Past Surgical History  Procedure Laterality Date  . Cholecystectomy    . Pacemaker insertion  02/2012    Biotronik PPM   . Cardiac electrophysiology study and ablation    . Esophagogastroduodenoscopy  Jan 2010    Dr. Arlyce DiceKaplan: multiple gastric erosions, benign path  . Esophagogastroduodenoscopy N/A 01/10/2014    ZOX:WRUEAVWURMR:Patulous EG junction. Query short segment Barrett's esophagus-status post pinch biopsy as described above. Hiatal hernia; otherwise negative EGD    Prior to Admission medications   Medication Sig Start Date End Date Taking? Authorizing Provider  albuterol (PROVENTIL HFA;VENTOLIN HFA) 108 (90 BASE) MCG/ACT inhaler Inhale 2 puffs into the lungs every 6 (six) hours as needed for wheezing or shortness of breath.    Yes Historical Provider, MD  budesonide-formoterol (SYMBICORT) 80-4.5 MCG/ACT inhaler Inhale 2 puffs into the lungs 2 (two) times daily.   Yes Historical Provider, MD  metoCLOPramide (REGLAN) 10 MG tablet Take 1 tablet (10 mg total) by mouth every  6 (six) hours as needed for nausea. 05/13/14  Yes Dione Booze, MD  omeprazole (PRILOSEC) 40 MG capsule Take 40 mg by mouth daily.   Yes Historical Provider, MD  oxyCODONE (OXY IR/ROXICODONE) 5 MG immediate release tablet Take 1 tablet (5 mg total) by mouth every 4 (four) hours as needed for severe pain. 05/13/14  Yes Dione Booze, MD  potassium chloride SA (K-DUR,KLOR-CON) 20 MEQ tablet Take 1 tablet (20 mEq total) by  mouth daily. 06/07/14  Yes Lyanne Co, MD  QUEtiapine (SEROQUEL) 25 MG tablet Take 50 mg by mouth 2 (two) times daily. 12/08/13  Yes Historical Provider, MD  tiotropium (SPIRIVA) 18 MCG inhalation capsule Place 18 mcg into inhaler and inhale daily.   Yes Historical Provider, MD  torsemide (DEMADEX) 20 MG tablet Take 2 tablets (40 mg total) by mouth 2 (two) times daily. 05/22/14  Yes Clydia Llano, MD  warfarin (COUMADIN) 4 MG tablet Take 4 mg by mouth at bedtime.   Yes Historical Provider, MD    Social History:  reports that she has been smoking Cigarettes.  She has a 15 pack-year smoking history. Her smokeless tobacco use includes Snuff. She reports that she does not drink alcohol or use illicit drugs.  Family History  Problem Relation Age of Onset  . Coronary artery disease Father 10  . Coronary artery disease Mother 27  . Coronary artery disease Brother   . Colon cancer Neg Hx      All the positives are listed in BOLD  Review of Systems:  HEENT: Headache, blurred vision, runny nose, sore throat Neck: Hypothyroidism, hyperthyroidism,,lymphadenopathy Chest : Shortness of breath, history of COPD, Asthma Heart : Chest pain, history of coronary arterey disease GI:  Nausea, vomiting, diarrhea, constipation, GERD GU: Dysuria, urgency, frequency of urination, hematuria Neuro: Stroke, seizures, syncope Psych: Depression, anxiety, hallucinations   Physical Exam: Blood pressure 156/90, pulse 75, temperature 97.7 F (36.5 C), temperature source Oral, resp. rate 18, height 5\' 6"  (1.676 m), weight 101.152 kg (223 lb), last menstrual period 02/08/2007, SpO2 98.00%. Constitutional:   Patient is a well-developed and well-nourished female* in no acute distress and cooperative with exam. Head: Normocephalic and atraumatic Mouth: Mucus membranes moist Eyes: PERRL, EOMI, conjunctivae normal Neck: Supple, No Thyromegaly Cardiovascular: RRR, S1 normal, S2 normal Pulmonary/Chest: Bilateral  wheezing Abdominal: Soft. Non-tender, non-distended, bowel sounds are normal, no masses, organomegaly, or guarding present.  Neurological: A&O x3, Strenght is normal and symmetric bilaterally, cranial nerve II-XII are grossly intact, no focal motor deficit, sensory intact to light touch bilaterally.  Extremities : Bilateral 1+ pitting edema of the lower extremities  Labs on Admission:  Basic Metabolic Panel:  Recent Labs Lab 06/07/14 0105 06/12/14 2137  NA 140 138  K 2.7* 3.8  CL 97 98  CO2 30 30  GLUCOSE 97 83  BUN 4* 7  CREATININE 0.71 0.82  CALCIUM 9.0 8.8   Liver Function Tests:  Recent Labs Lab 06/07/14 0105  AST 32  ALT 21  ALKPHOS 87  BILITOT 0.8  PROT 8.2  ALBUMIN 3.7   No results found for this basename: LIPASE, AMYLASE,  in the last 168 hours No results found for this basename: AMMONIA,  in the last 168 hours CBC:  Recent Labs Lab 06/07/14 0105 06/12/14 2137  WBC 9.0 7.4  NEUTROABS  --  3.6  HGB 13.9 13.4  HCT 40.9 39.6  MCV 102.0* 103.1*  PLT 241 201   Cardiac Enzymes:  Recent Labs Lab 06/07/14 0105 06/07/14  0330 06/12/14 2137  TROPONINI 0.38* 0.43* 0.40*    BNP (last 3 results)  Recent Labs  05/16/14 1523 06/07/14 0105 06/12/14 2137  PROBNP 2906.0* 1559.0* 1342.0*   CBG: No results found for this basename: GLUCAP,  in the last 168 hours  Radiological Exams on Admission: Dg Chest 2 View  06/12/2014   CLINICAL DATA:  Shortness of breath.  Chest pain.  EXAM: CHEST  2 VIEW  COMPARISON:  06/07/2014  FINDINGS: Cardiac pacemaker. Mild cardiac enlargement without vascular congestion. Mild emphysematous changes in the lungs. No focal airspace disease or consolidation. No blunting of costophrenic angles. No pneumothorax. No significant change since prior study.  IMPRESSION: No active cardiopulmonary disease.   Electronically Signed   By: Burman Nieves M.D.   On: 06/12/2014 22:06    EKG: Independently reviewed. Normal sinus  rhythm   Assessment/Plan Active Problems:   COPD (chronic obstructive pulmonary disease)   Chest pain   Pacemaker   Elevated troponin I level   Chronic anticoagulation   Dyspnea  Elevated troponin/ chest pain Patient has chronically mildly elevated troponin,  Today troponin is 0.40. Patient was seen by cardiology on 05/17/2014, see the detailed note from cardiology. Likely due to demand ischemia from acute on chronic systolic CHF. Will cycle the troponin in the hospital. Consult cardiology in the a.m.  Acute on chronic systolic CHF Patient's BNP is elevated to 1342. Lasix 80 mg IV times one have been given in the ED. Restart Lasix 20 mg IV every 12 hours from tomorrow morning.  COPD exacerbation Patient also has bilateral wheezing and decreased breath sounds bilaterally. We'll start Solu-Medrol 60 mg IV every 6 hours, albuterol and ipratropium nebulizers every 6 hours scheduled.   Atrial fibrillation Continue Coumadin per pharmacy consultation. Heart rate is controlled at this time patient is in normal sinus rhythm.  Code status: Patient is full code  Family discussion: No family at bedside   Time Spent on Admission: 60 minutes  LAMA,GAGAN S [name] Pager: [phone] 06/13/2014, 12:27 AM  If 7PM-7AM, please contact night-coverage  [email or web address]  Password [email or web address]  0

## 2014-06-13 NOTE — Progress Notes (Signed)
[name] PROGRESS NOTE  Kim [name] PFY:[id number] DOB: 1966/01/28 DOA: 06/12/2014 PCP: Dorian Heckle, MD  Assessment/Plan: Elevated troponin/ chest pain  Patient has chronically mildly elevated troponin on admission. Hx of same.  Patient was seen by cardiology on 05/17/2014. EKG with SR. Some question of non-compliance. Troponin tending downward. Evaluated by cardiology who recommend continued diuresis at increased dose of lasix and resumption of BB. Also recommend non-invasive ischemic testing. Plan for Lexiscan in am per cards.  Acute on chronic systolic CHF  Patient's BNP is elevated to 1342 on admission. Likely related to non-compliance.  Chest xray no acute process.  Appears clinically improved.  Intake and output data pending. Added daily weights. Appreciate cardiology input. See #1.   COPD exacerbation  Improved. No wheezes. We'll continue Solu-Medrol and taper.continue  ipratropium nebulizers every 6 hours scheduled.   Atrial fibrillation  Heart rate fair controll. INR 1.52, BB added per cardiology  Hypokalemia: Mild likely related to diuresis. Will replete and monitor   Code Status: full Family Communication: none present Disposition Plan: home when ready   Consultants:  Cardiology Dr Wyline Mood  Procedures:  none  Antibiotics:  none  HPI/Subjective: Lying in bed. Arouses to verbal stimuli but is lethargic. Complains of pain in feet  Objective: Filed Vitals:   06/13/14 0355  BP: 131/88  Pulse: 73  Temp: 97.7 F (36.5 C)  Resp: 18   No intake or output data in the 24 hours ending 06/13/14 1110 Filed Weights   06/12/14 2043 06/13/14 0134 06/13/14 0355  Weight: 101.152 kg (223 lb) 101.5 kg (223 lb 12.3 oz) 104.5 kg (230 lb 6.1 oz)    Exam:   General:  Obese NAD  Cardiovascular: RRR No MGR 1+ LE edema  Respiratory: normal effort somewhat shallow faint crackles bilateral bases. No wheeze  Abdomen: obese soft +BS non-tender to palpation.    Musculoskeletal: joints without swelling/erythema. Bilateral feet tender to touch. PPP   Data Reviewed: Basic Metabolic Panel:  Recent Labs Lab 06/07/14 0105 06/12/14 2137 06/13/14 0605  NA 140 138 141  K 2.7* 3.8 3.6*  CL 97 98 97  CO2 30 30 31   GLUCOSE 97 83 152*  BUN 4* 7 6  CREATININE 0.71 0.82 0.80  CALCIUM 9.0 8.8 8.5   Liver Function Tests:  Recent Labs Lab 06/07/14 0105 06/13/14 0605  AST 32 31  ALT 21 19  ALKPHOS 87 84  BILITOT 0.8 1.0  PROT 8.2 8.0  ALBUMIN 3.7 3.6   No results found for this basename: LIPASE, AMYLASE,  in the last 168 hours No results found for this basename: AMMONIA,  in the last 168 hours CBC:  Recent Labs Lab 06/07/14 0105 06/12/14 2137 06/13/14 0605  WBC 9.0 7.4 5.4  NEUTROABS  --  3.6  --   HGB 13.9 13.4 13.7  HCT 40.9 39.6 41.3  MCV 102.0* 103.1* 103.0*  PLT 241 201 194   Cardiac Enzymes:  Recent Labs Lab 06/07/14 0105 06/07/14 0330 06/12/14 2137 06/13/14 0113 06/13/14 0605  TROPONINI 0.38* 0.43* 0.40* 0.36* 0.33*   BNP (last 3 results)  Recent Labs  05/16/14 1523 06/07/14 0105 06/12/14 2137  PROBNP 2906.0* 1559.0* 1342.0*   CBG: No results found for this basename: GLUCAP,  in the last 168 hours  No results found for this or any previous visit (from the past 240 hour(s)).   Studies: Dg Chest 2 View  06/12/2014   CLINICAL DATA:  Shortness of breath.  Chest pain.  EXAM:  CHEST  2 VIEW  COMPARISON:  06/07/2014  FINDINGS: Cardiac pacemaker. Mild cardiac enlargement without vascular congestion. Mild emphysematous changes in the lungs. No focal airspace disease or consolidation. No blunting of costophrenic angles. No pneumothorax. No significant change since prior study.  IMPRESSION: No active cardiopulmonary disease.   Electronically Signed   By: Burman NievesWilliam  Stevens M.D.   On: 06/12/2014 22:06    Scheduled Meds: . furosemide  20 mg Intravenous BID  . ipratropium-albuterol  3 mL Nebulization Q6H  .  methylPREDNISolone (SOLU-MEDROL) injection  60 mg Intravenous 4 times per day  . mometasone-formoterol  2 puff Inhalation BID  . potassium chloride SA  20 mEq Oral Daily  . QUEtiapine  50 mg Oral BID  . sodium chloride  3 mL Intravenous Q12H  . sodium chloride  3 mL Intravenous Q12H  . warfarin  5 mg Oral Once  . Warfarin - Pharmacist Dosing Inpatient   Does not apply Q24H   Continuous Infusions:   Active Problems:   COPD (chronic obstructive pulmonary disease)   Chest pain   Pacemaker   Elevated troponin I level   Chronic anticoagulation   Dyspnea    Time spent: 35 minutes    [facility] HospitalBLACK,KAREN M  [name] Pager [phone]. If 7PM-7AM, please contact night-coverage at [email or web address], password [email or web address] 06/13/2014, 11:10 AM  LOS: 1 day

## 2014-06-14 ENCOUNTER — Inpatient Hospital Stay (HOSPITAL_COMMUNITY): Payer: Medicaid Other

## 2014-06-14 ENCOUNTER — Encounter (HOSPITAL_COMMUNITY): Payer: Self-pay

## 2014-06-14 DIAGNOSIS — Z950 Presence of cardiac pacemaker: Secondary | ICD-10-CM

## 2014-06-14 DIAGNOSIS — Z9119 Patient's noncompliance with other medical treatment and regimen: Secondary | ICD-10-CM

## 2014-06-14 DIAGNOSIS — R0609 Other forms of dyspnea: Secondary | ICD-10-CM

## 2014-06-14 DIAGNOSIS — M79609 Pain in unspecified limb: Secondary | ICD-10-CM

## 2014-06-14 DIAGNOSIS — I2510 Atherosclerotic heart disease of native coronary artery without angina pectoris: Secondary | ICD-10-CM

## 2014-06-14 DIAGNOSIS — R079 Chest pain, unspecified: Secondary | ICD-10-CM

## 2014-06-14 DIAGNOSIS — Z7901 Long term (current) use of anticoagulants: Secondary | ICD-10-CM

## 2014-06-14 DIAGNOSIS — I209 Angina pectoris, unspecified: Secondary | ICD-10-CM

## 2014-06-14 DIAGNOSIS — F17200 Nicotine dependence, unspecified, uncomplicated: Secondary | ICD-10-CM

## 2014-06-14 DIAGNOSIS — F341 Dysthymic disorder: Secondary | ICD-10-CM

## 2014-06-14 DIAGNOSIS — F1910 Other psychoactive substance abuse, uncomplicated: Secondary | ICD-10-CM

## 2014-06-14 DIAGNOSIS — Z91199 Patient's noncompliance with other medical treatment and regimen due to unspecified reason: Secondary | ICD-10-CM

## 2014-06-14 DIAGNOSIS — B182 Chronic viral hepatitis C: Secondary | ICD-10-CM

## 2014-06-14 DIAGNOSIS — D72829 Elevated white blood cell count, unspecified: Secondary | ICD-10-CM | POA: Diagnosis not present

## 2014-06-14 DIAGNOSIS — K7460 Unspecified cirrhosis of liver: Secondary | ICD-10-CM

## 2014-06-14 DIAGNOSIS — R0989 Other specified symptoms and signs involving the circulatory and respiratory systems: Secondary | ICD-10-CM

## 2014-06-14 LAB — CBC
HCT: 44.4 % (ref 36.0–46.0)
HEMOGLOBIN: 14.8 g/dL (ref 12.0–15.0)
MCH: 34.7 pg — ABNORMAL HIGH (ref 26.0–34.0)
MCHC: 33.3 g/dL (ref 30.0–36.0)
MCV: 104 fL — ABNORMAL HIGH (ref 78.0–100.0)
Platelets: 230 10*3/uL (ref 150–400)
RBC: 4.27 MIL/uL (ref 3.87–5.11)
RDW: 15 % (ref 11.5–15.5)
WBC: 19.6 10*3/uL — ABNORMAL HIGH (ref 4.0–10.5)

## 2014-06-14 LAB — BASIC METABOLIC PANEL
Anion gap: 12 (ref 5–15)
BUN: 13 mg/dL (ref 6–23)
CO2: 33 mEq/L — ABNORMAL HIGH (ref 19–32)
Calcium: 8.9 mg/dL (ref 8.4–10.5)
Chloride: 95 mEq/L — ABNORMAL LOW (ref 96–112)
Creatinine, Ser: 0.76 mg/dL (ref 0.50–1.10)
GLUCOSE: 139 mg/dL — AB (ref 70–99)
POTASSIUM: 4 meq/L (ref 3.7–5.3)
Sodium: 140 mEq/L (ref 137–147)

## 2014-06-14 LAB — PROTIME-INR
INR: 1.66 — AB (ref 0.00–1.49)
PROTHROMBIN TIME: 19.6 s — AB (ref 11.6–15.2)

## 2014-06-14 MED ORDER — PREDNISONE 10 MG PO TABS
ORAL_TABLET | ORAL | Status: DC
Start: 2014-06-14 — End: 2014-06-24

## 2014-06-14 MED ORDER — SODIUM CHLORIDE 0.9 % IJ SOLN
INTRAMUSCULAR | Status: AC
  Administered 2014-06-14: 10 mL via INTRAVENOUS
  Filled 2014-06-14: qty 10

## 2014-06-14 MED ORDER — TECHNETIUM TC 99M SESTAMIBI - CARDIOLITE
10.0000 | Freq: Once | INTRAVENOUS | Status: AC | PRN
  Administered 2014-06-14: 08:00:00 10 via INTRAVENOUS

## 2014-06-14 MED ORDER — MOMETASONE FURO-FORMOTEROL FUM 100-5 MCG/ACT IN AERO: 2.0000 | INHALATION_SPRAY | Freq: Two times a day (BID) | RESPIRATORY_TRACT | Status: DC

## 2014-06-14 MED ORDER — FUROSEMIDE 80 MG PO TABS: 80.0000 mg | ORAL_TABLET | Freq: Two times a day (BID) | ORAL | Status: DC

## 2014-06-14 MED ORDER — METOPROLOL SUCCINATE ER 25 MG PO TB24: 25.0000 mg | ORAL_TABLET | Freq: Every day | ORAL | Status: AC

## 2014-06-14 MED ORDER — TORSEMIDE 20 MG PO TABS: 40.0000 mg | ORAL_TABLET | Freq: Two times a day (BID) | ORAL | Status: DC

## 2014-06-14 MED ORDER — WARFARIN SODIUM 5 MG PO TABS
5.0000 mg | ORAL_TABLET | Freq: Once | ORAL | Status: AC
  Administered 2014-06-14: 5 mg via ORAL
  Filled 2014-06-14: qty 1

## 2014-06-14 MED ORDER — TECHNETIUM TC 99M SESTAMIBI GENERIC - CARDIOLITE
30.0000 | Freq: Once | INTRAVENOUS | Status: AC | PRN
  Administered 2014-06-14: 30 via INTRAVENOUS

## 2014-06-14 MED ORDER — WARFARIN SODIUM 5 MG PO TABS: 5.0000 mg | ORAL_TABLET | Freq: Every day | ORAL | Status: DC

## 2014-06-14 MED ORDER — REGADENOSON 0.4 MG/5ML IV SOLN
INTRAVENOUS | Status: AC
  Administered 2014-06-14: 0.4 mg via INTRAVENOUS
  Filled 2014-06-14: qty 5

## 2014-06-14 MED ORDER — WARFARIN SODIUM 5 MG PO TABS: 5.0000 mg | ORAL_TABLET | Freq: Once | ORAL | Status: DC

## 2014-06-14 NOTE — Progress Notes (Signed)
AVS reviewed with patient.  Patient verbalized understanding of discharge instructions, physician follow-up, medications.  Patient educated on heart failure.  Verbalized understanding of medications, daily weights and recording daily weights, and when to call the doctor/911.  New set of scales provided to patient.  Patient's IV removed.  Site WNL.  Patient reports all belonging intact and in possession at time of discharge.  Patient transported by NT via w/c to main entrance for discharge.  Patient stable at time of discharge.

## 2014-06-14 NOTE — Progress Notes (Signed)
The patient was seen and examined, and I agree with the assessment and plan as documented above, with modifications as noted below. Pt's primary complaint this morning was bilateral feet pain related to dorsal swelling. Said chest pain is better. Denies SOB. Has known nonobstructive mild to moderate CAD from 2013 cath. Troponins peaked at 0.4, with most recent at 0.32 on 7/8. Normal LV systolic function and grade II diastolic dysfunction by echo in 05/2014. Await results of Lexiscan. Continue current medical therapy for now. Can likely be transitioned back to oral diuretics as leg edema is now minimal. Question medication compliance being primary issue for repeat hospitalizations, with suspicion for drug-seeking behavior as well.

## 2014-06-14 NOTE — Progress Notes (Deleted)
Patient seen, independently examined and chart reviewed. I agree with exam, assessment and plan discussed with Karen Black, NP.  Subjective: Overall feeling somewhat better. Breathing okay. Chest pain about the same or better.  Objective: Afebrile, vital signs stable. No hypoxia.  Gen. Resting comfortably. Appears calm.  Psych. Alert. Speech fluent and clear.  Cardiovascular. Regular rate and rhythm. No murmur, rub or gallop. No lower extremity edema.  Respiratory. Clear to auscultation bilaterally. No wheezes, rales or rhonchi. Normal respiratory effort.     I/O: Excellent urine output.  Chemistry: Basic metabolic panel unremarkable.  Heme: WBC 19.6, secondary to steroids. CBC otherwise unremarkable. INR 1.66.  Other: Urine drug screen was positive for benzodiazepines , opiates and tetrahydrocannabinol.  Acute on chronic diastolic congestive heart failure. Thought to be secondary to noncompliance with diet, possibly medications. Adequate diuresis. Appears compensated at this point. Resume chronic diuretic therapy.  Chest pain, per cardiology chronic, atypical. Discussed with cardiology, nuclear stress test noted with mildly reversible defect. Dr. Koneswaran recommends discharge home without further evaluation at this point, continue metoprolol. Note she reports allergies to nitroglycerin and aspirin.  Elevated troponin (chronic). Stable/improved. As above.  COPD exacerbation? Appears resolved. Favor short steroid taper.  Atrial fibrillation. With warfarin subtherapeutic on admission, compliance questioned. Followup as an outpatient whether patient may benefit from alternative agent.  Marijuana use. Recommend cessation.  Daniel Goodrich, MD [name] [phone]  

## 2014-06-14 NOTE — Progress Notes (Signed)
Stress Lab Nurses Notes - Kim [name]  Kim [name] 06/14/2014 Reason for doing test: Chest Pain Type of test: Marlane Hatcher / Inpatient Rm 327 Nurse performing test: Parke Poisson, RN Nuclear Medicine Tech: Lou Cal Echo Tech: Not Applicable MD performing test: Koneswaran/K.Lyman Bishop NP Family MD: Gwinda Passe Test explained and consent signed: Yes.   IV started: No redness or edema and Saline lock from floor Symptoms: SOB Treatment/Intervention: None Reason test stopped: protocol completed After recovery IV was: No redness or edema and Saline Lock flushed Patient to return to Nuc. Med at : 10:30 Patient discharged: Transported back to room 327 via wc Patient's Condition upon discharge was: stable Comments: During test 114/86 & HR 85.  Recovery BP 120/81 & HR 72.  Symptoms resolved in recovery. Erskine Speed T

## 2014-06-14 NOTE — Discharge Summary (Signed)
Patient seen, independently examined and chart reviewed. I agree with exam, assessment and plan discussed with Toya Smothers, NP.  Subjective: Overall feeling somewhat better. Breathing okay. Chest pain about the same or better.  Objective: Afebrile, vital signs stable. No hypoxia.  Gen. Resting comfortably. Appears calm.  Psych. Alert. Speech fluent and clear.  Cardiovascular. Regular rate and rhythm. No murmur, rub or gallop. No lower extremity edema.  Respiratory. Clear to auscultation bilaterally. No wheezes, rales or rhonchi. Normal respiratory effort.     I/O: Excellent urine output.  Chemistry: Basic metabolic panel unremarkable.  Heme: WBC 19.6, secondary to steroids. CBC otherwise unremarkable. INR 1.66.  Other: Urine drug screen was positive for benzodiazepines , opiates and tetrahydrocannabinol.  Acute on chronic diastolic congestive heart failure. Thought to be secondary to noncompliance with diet, possibly medications. Adequate diuresis. Appears compensated at this point. Resume chronic diuretic therapy.  Chest pain, per cardiology chronic, atypical. Discussed with cardiology, nuclear stress test noted with mildly reversible defect. Dr. Purvis Sheffield recommends discharge home without further evaluation at this point, continue metoprolol. Note she reports allergies to nitroglycerin and aspirin.  Elevated troponin (chronic). Stable/improved. As above.  COPD exacerbation? Appears resolved. Favor short steroid taper.  Atrial fibrillation. With warfarin subtherapeutic on admission, compliance questioned. Followup as an outpatient whether patient may benefit from alternative agent.  Marijuana use. Recommend cessation.  Brendia Sacks, MD [name] [phone]

## 2014-06-14 NOTE — Progress Notes (Signed)
Consulting cardiologist: Purvis SheffieldKoneswaran Primary Cardiologist: Branch   Subjective:    Patient seen and examined in the stress lab,prior to lexiscan/cardiolite. Continues to have complaints of pain,.  Objective:   Temp:  [97.6 F (36.4 C)-98 F (36.7 C)] 97.6 F (36.4 C) (07/09 14780614) Pulse Rate:  [71-87] 71 (07/09 0614) Resp:  [20] 20 (07/09 0614) BP: (102-133)/(65-71) 102/65 mmHg (07/09 0614) SpO2:  [92 %-99 %] 98 % (07/09 0707) Weight:  [229 lb 14.4 oz (104.282 kg)] 229 lb 14.4 oz (104.282 kg) (07/09 0614) Last BM Date: 06/12/14  Filed Weights   06/13/14 0134 06/13/14 0355 06/14/14 [id number]  Weight: 223 lb 12.3 oz (101.5 kg) 230 lb 6.1 oz (104.5 kg) 229 lb 14.4 oz (104.282 kg)    Intake/Output Summary (Last 24 hours) at 06/14/14 1016 Last data filed at 06/14/14 [id number]  Gross per 24 hour  Intake    720 ml  Output   1600 ml  Net   -880 ml    Telemetry:Ventricualar pacing  Exam:  General: No acute distress.  HEENT: Conjunctiva and lids normal, oropharynx clear.  Lungs: Crackles, without wheezes.  Cardiac: No elevated JVP or bruits. RRR, no gallop or rub.   Abdomen: Normoactive bowel sounds, nontender, nondistended.  Extremities: 1+ pitting edema, distal pulses full.  Neuropsychiatric: Alert and oriented x3, affect appropriate.   Lab Results:  Basic Metabolic Panel:  Recent Labs Lab 06/12/14 2137 06/13/14 0605 06/14/14 0632  NA 138 141 140  K 3.8 3.6* 4.0  CL 98 97 95*  CO2 30 31 33*  GLUCOSE 83 152* 139*  BUN 7 6 13   CREATININE 0.82 0.80 0.76  CALCIUM 8.8 8.5 8.9    Liver Function Tests:  Recent Labs Lab 06/13/14 0605  AST 31  ALT 19  ALKPHOS 84  BILITOT 1.0  PROT 8.0  ALBUMIN 3.6    CBC:  Recent Labs Lab 06/12/14 2137 06/13/14 0605 06/14/14 0632  WBC 7.4 5.4 19.6*  HGB 13.4 13.7 14.8  HCT 39.6 41.3 44.4  MCV 103.1* 103.0* 104.0*  PLT 201 194 230    Cardiac Enzymes:  Recent Labs Lab 06/13/14 0113 06/13/14 0605  06/13/14 1241  TROPONINI 0.36* 0.33* 0.32*    BNP:  Recent Labs  05/16/14 1523 06/07/14 0105 06/12/14 2137  PROBNP 2906.0* 1559.0* 1342.0*    Coagulation:  Recent Labs Lab 06/12/14 2137 06/14/14 0632  INR 1.52* 1.66*    Radiology: Dg Chest 2 View  06/12/2014   CLINICAL DATA:  Shortness of breath.  Chest pain.  EXAM: CHEST  2 VIEW  COMPARISON:  06/07/2014  FINDINGS: Cardiac pacemaker. Mild cardiac enlargement without vascular congestion. Mild emphysematous changes in the lungs. No focal airspace disease or consolidation. No blunting of costophrenic angles. No pneumothorax. No significant change since prior study.  IMPRESSION: No active cardiopulmonary disease.   Electronically Signed   By: Burman NievesWilliam  Stevens M.D.   On: 06/12/2014 22:06     ECG: V-Paced   Medications:   Scheduled Medications: . furosemide  40 mg Intravenous BID  . ipratropium-albuterol  3 mL Nebulization Q6H  . metoprolol succinate  25 mg Oral Daily  . mometasone-formoterol  2 puff Inhalation BID  . potassium chloride SA  20 mEq Oral Daily  . predniSONE  40 mg Oral Q breakfast  . QUEtiapine  50 mg Oral BID  . sodium chloride  3 mL Intravenous Q12H  . sodium chloride  3 mL Intravenous Q12H  . Warfarin - Pharmacist Dosing Inpatient  Does not apply Q24H    Infusions:    PRN Medications: sodium chloride, albuterol, metoCLOPramide, ondansetron (ZOFRAN) IV, ondansetron, oxyCODONE, sodium chloride, technetium sestamibi generic   Assessment and Plan:   1. CP: Chest pain is chronic and not new. She has atypical symptoms with reproducible pain on palpation. UDS positive for THC. She denies use. She is having lexiscan cardiolite stress test today. IF negative can go home. I have counseled her on THC use with recurrent chest pain,.  2. CAD: Last cath in 2013 with diagonal disease,60-70%, small vessel with LAD 40%. Await results. She is on coumadin. If cath is planned, she will have to be stopped. INR is  still sub-therapeutic at 1.66, and therefore holding one dose should allow for cath if necessary   3. Diastolic CHF: Related to non-compliance issues with diet, and probably medications. She denies.. Low sodium diet and better responsibility for her own health maintenance is imperative. She has diuresed almost 2 liters. Continue IV lasix BID. Creatinine 0.76. Uncertain of dry wt. CO2 33. Hx of COPD  4. Atrial fib: She is on coumadin, and was not therapeutic on admission. Question compliance. This will be an issue long term if she remains on coumadin. Consider NOAC agent instead. Cost issue my defer.   5. Question Drug Seeking Behavior: Dependent on narcotics as OP.    Bettey Mare. Myleen Brailsford NP  06/14/2014, 10:16 AM

## 2014-06-14 NOTE — Discharge Summary (Signed)
Physician Discharge Summary  Kim [name]:[id number] DOB: Apr 13, 1966 DOA: 06/12/2014  PCP: Dorian Heckle, MD  Admit date: 06/12/2014 Discharge date: 06/14/2014  Time spent: 40 minutes  Recommendations for Outpatient Follow-up:  1. PCP Dr Gwinda Passe follow up in 1 week for evaluation of CHF, chest pain, respiratory status and compliance with medical plan.  2. Continue weekly HH for INR check and coumadin dosing. First check 06/18/14 3. Follow up with cardiology on 06/27/14 for evaluation of chest pain and volume status  Discharge Diagnoses:  Principal Problem:   Acute on chronic diastolic congestive heart failure Active Problems:   COPD (chronic obstructive pulmonary disease)   Chest pain   Hypokalemia   Pacemaker   Elevated troponin I level   Chronic anticoagulation   Dyspnea   Leukocytosis, unspecified   Discharge Condition: stable  Diet recommendation: heart healthy/carb modified  Filed Weights   06/13/14 0134 06/13/14 0355 06/14/14 0614  Weight: 101.5 kg (223 lb 12.3 oz) 104.5 kg (230 lb 6.1 oz) 104.282 kg (229 lb 14.4 oz)    History of present illness:  48 year old female who has a past medical history of COPD (chronic obstructive pulmonary disease); Coronary atherosclerosis of native coronary artery; Hypothyroidism; Hepatitis C; Pneumonia; Cirrhosis; Type 2 diabetes mellitus; GERD (gastroesophageal reflux disease); Headache(784.0); Anxiety; Atrial fibrillation and flutter; Chronic pain; PE (pulmonary embolism); Chronic anticoagulation; History of medication noncompliance; SSS (sick sinus syndrome); and Cardiomyopathy and was  recently discharged from the hospital, at that time she had elevated troponin which was thought to be secondary to demand ischemia. The patient came to hospital on 06/13/14 with a chief complaint of swelling of the lower extremities, with difficulty walking on her feet. She also complained of worsening shortness of breath and chest pain. Patient has a  history of systolic congestive heart failure and had been taking torsemide at home. Patient also has history of A. fib and  currently on anticoagulation with Coumadin. Patient complained of chest pain which had been intermittent. She admited to having nausea but no vomiting.  In the ED patient again felt to have elevated troponin 0.40, EKG showed normal sinus rhythm.    Hospital Course:  Acute on chronic diastolic CHF   Likely related to non-compliance. Patient's BNP  elevated to 1342 on admission. Chest xray no acute process. Echo recently LVEF 55% and grade 2 diastolic dysfunction.  Diuresed with IV lasix. At discharge volume status -500. Weight stable at 104.2kg. LE edema much improved. Resume home oral diuretics per cardiology. Results Lexiscan  Elevated troponin/ chest pain  Patient has chronically mildly elevated troponin as well as chronic chest pain. On admission initial troponin 0.40  History of nonobstructive coronary artery disease by cath 8/13. EKG with SR. some question of non-compliance. Troponin stable during hospitalization. Evaluated by cardiology who opine symptoms atypical and reproducible. Results of lexiscan as below. has been encouraged to comply with medical managemtn .  COPD exacerbation? Received solumedrol in ED. Wheezes resolved quickly. Chest xray without cardiopulmonary disease.  At discharge oxygen saturation level >90% on room air. Will discharge with prednisone taper. Continue home inhalers.   Atrial fibrillation  Heart rate fair controll. INR 1.66 at discharge. HH RN draws blood weekly for INR check and coumadin dosing PCP. Next visit 06/18/14.  BB added per cardiology  Hypokalemia:  Mild. likely related to diuresis.   Leukocytosis Likely steroid induced. She received solumedrol initially for wheezes. She remained afebrile and non-toxic appearing. Expect resolution as steroids tapered.   Procedures: lexiscan  06/14/14 report yields Low risk Lexiscan Cardiolite stress  test. Mildly reversible defect in the apical anterior and mid anterior walls, which may represent a mild degree of ischemia vs variable soft tissue attenuation artifact. Normal left ventricular systolic function, calculated LVEF 52%. No chest pain was reported.   Consultations:  cardiology  Discharge Exam: Filed Vitals:   06/14/14 1259  BP: 122/80  Pulse: 73  Temp: 97.8 F (36.6 C)  Resp:     General: obese lying in bed easily aroused Cardiovascular: RRR No MGR trace -1+ LE edema PPP Respiratory: normal effort BS clear bilaterally with good air movement. No wheeze or rhonchi  Discharge Instructions You were cared for by a hospitalist during your hospital stay. If you have any questions about your discharge medications or the care you received while you were in the hospital after you are discharged, you can call the unit and asked to speak with the hospitalist on call if the hospitalist that took care of you is not available. Once you are discharged, your primary care physician will handle any further medical issues. Please note that NO REFILLS for any discharge medications will be authorized once you are discharged, as it is imperative that you return to your primary care physician (or establish a relationship with a primary care physician if you do not have one) for your aftercare needs so that they can reassess your need for medications and monitor your lab values.     Medication List         albuterol 108 (90 BASE) MCG/ACT inhaler  Commonly known as:  PROVENTIL HFA;VENTOLIN HFA  Inhale 2 puffs into the lungs every 6 (six) hours as needed for wheezing or shortness of breath.     budesonide-formoterol 80-4.5 MCG/ACT inhaler  Commonly known as:  SYMBICORT  Inhale 2 puffs into the lungs 2 (two) times daily.     metoCLOPramide 10 MG tablet  Commonly known as:  REGLAN  Take 1 tablet (10 mg total) by mouth every 6 (six) hours as needed for nausea.     metoprolol succinate 25 MG  24 hr tablet  Commonly known as:  TOPROL-XL  Take 1 tablet (25 mg total) by mouth daily.     omeprazole 40 MG capsule  Commonly known as:  PRILOSEC  Take 40 mg by mouth daily.     oxyCODONE 5 MG immediate release tablet  Commonly known as:  Oxy IR/ROXICODONE  Take 1 tablet (5 mg total) by mouth every 4 (four) hours as needed for severe pain.     potassium chloride SA 20 MEQ tablet  Commonly known as:  K-DUR,KLOR-CON  Take 1 tablet (20 mEq total) by mouth daily.     predniSONE 10 MG tablet  Commonly known as:  DELTASONE  Take 4 tabs on 06/15/14 then take 3 tabs for 2 days then take 2 tabs for 2 days then take 1 tab for 2 days then stop.     QUEtiapine 25 MG tablet  Commonly known as:  SEROQUEL  Take 50 mg by mouth 2 (two) times daily.     tiotropium 18 MCG inhalation capsule  Commonly known as:  SPIRIVA  Place 18 mcg into inhaler and inhale daily.     torsemide 20 MG tablet  Commonly known as:  DEMADEX  Take 2 tablets (40 mg total) by mouth 2 (two) times daily.     warfarin 5 MG tablet  Commonly known as:  COUMADIN  Take 1 tablet (5 mg total)  by mouth daily.       Allergies  Allergen Reactions  . Nitroglycerin Other (See Comments)    CAUSED NUMBNESS ALL OVER  . Doxycycline Itching  . Flexeril [Cyclobenzaprine] Other (See Comments)    Sweating, Lightheaded   . Ibuprofen Other (See Comments)    Increase BP, dizziness, lightheaded  . Tramadol Itching  . Tylenol [Acetaminophen] Other (See Comments)    Pt has Hep C  . Vesicare [Solifenacin] Other (See Comments)    Makes my sweat turn yellow  . Amoxil [Amoxicillin] Nausea Only   Follow-up Information   Follow up with Dorian Heckle, MD. Schedule an appointment as soon as possible for a visit in 1 week. (appointment in 1 week for evaluation of chest pain. INR check by [facility] 7/13 for coumadin dosing)    Specialty:  Family Medicine   Contact information:   [address] [place] [address] [phone]       Follow  up with Jacolyn Reedy, PA-C On 06/27/2014. [place] [facility] office at [facility] 11:20am)    Specialty:  Cardiology   Contact information:   [address] [place] [address] [phone]        The results of significant diagnostics from this hospitalization (including imaging, microbiology, ancillary and laboratory) are listed below for reference.    Significant Diagnostic Studies: Dg Chest 2 View  06/12/2014   CLINICAL DATA:  Shortness of breath.  Chest pain.  EXAM: CHEST  2 VIEW  COMPARISON:  06/07/2014  FINDINGS: Cardiac pacemaker. Mild cardiac enlargement without vascular congestion. Mild emphysematous changes in the lungs. No focal airspace disease or consolidation. No blunting of costophrenic angles. No pneumothorax. No significant change since prior study.  IMPRESSION: No active cardiopulmonary disease.   Electronically Signed   By: Burman Nieves M.D.   On: 06/12/2014 22:06   Dg Chest 2 View  06/07/2014   CLINICAL DATA:  Shortness of breath  EXAM: CHEST  2 VIEW  COMPARISON:  05/16/2014  FINDINGS: There is a left chest wall pacer device with lead in the right atrial appendage and right ventricle. The heart size and mediastinal contours are within normal limits. Both lungs are clear. The visualized skeletal structures are unremarkable.  IMPRESSION: 1. No acute cardiopulmonary abnormalities.   Electronically Signed   By: Signa Kell M.D.   On: 06/07/2014 01:32   Dg Chest Port 1 View  05/16/2014   CLINICAL DATA:  Shortness of Breath  EXAM: PORTABLE CHEST - 1 VIEW  COMPARISON:  chest radiograph and chest CT March 10, 2014  FINDINGS: The interstitium is borderline prominent; there may be trace edema. There is no airspace consolidation. Heart is mildly enlarged with mild pulmonary venous hypertension. Pacemaker leads are attached to the right atrium and right ventricle. No pneumothorax. No adenopathy.  IMPRESSION: Suspect a degree of congestive heart failure with trace edema as well as  cardiomegaly and pulmonary venous hypertension. No consolidation.   Electronically Signed   By: Bretta Bang M.D.   On: 05/16/2014 15:13   Dg Abd 2 Views  05/19/2014   CLINICAL DATA:  Pain.  EXAM: ABDOMEN - 2 VIEW  COMPARISON:  CT 01/09/2014  FINDINGS: Soft tissue structures are unremarkable. Surgical clips right upper quadrant consistent prior cholecystectomy. Gas pattern is nonspecific. No bowel distention. No free air. Right base atelectasis and/or infiltrate with right-sided pleural effusion noted. Cardiac pacer. Cardiomegaly.  IMPRESSION: 1. No evidence of bowel distention or free air. Prior cholecystectomy. 2. Right lower lobe  atelectasis and/or infiltrate with right-sided pleural effusion . 3. Cardiomegaly. Cardiac pacer with lead tips in the right atrium right ventricle.   Electronically Signed   By: Maisie Fushomas  Register   On: 05/19/2014 11:44    Microbiology: No results found for this or any previous visit (from the past 240 hour(s)).   Labs: Basic Metabolic Panel:  Recent Labs Lab 06/12/14 2137 06/13/14 0605 06/14/14 0632  NA 138 141 140  K 3.8 3.6* 4.0  CL 98 97 95*  CO2 30 31 33*  GLUCOSE 83 152* 139*  BUN 7 6 13   CREATININE 0.82 0.80 0.76  CALCIUM 8.8 8.5 8.9   Liver Function Tests:  Recent Labs Lab 06/13/14 0605  AST 31  ALT 19  ALKPHOS 84  BILITOT 1.0  PROT 8.0  ALBUMIN 3.6   No results found for this basename: LIPASE, AMYLASE,  in the last 168 hours No results found for this basename: AMMONIA,  in the last 168 hours CBC:  Recent Labs Lab 06/12/14 2137 06/13/14 0605 06/14/14 0632  WBC 7.4 5.4 19.6*  NEUTROABS 3.6  --   --   HGB 13.4 13.7 14.8  HCT 39.6 41.3 44.4  MCV 103.1* 103.0* 104.0*  PLT 201 194 230   Cardiac Enzymes:  Recent Labs Lab 06/12/14 2137 06/13/14 0113 06/13/14 0605 06/13/14 1241  TROPONINI 0.40* 0.36* 0.33* 0.32*   BNP: BNP (last 3 results)  Recent Labs  05/16/14 1523 06/07/14 0105 06/12/14 2137  PROBNP 2906.0*  1559.0* 1342.0*   CBG: No results found for this basename: GLUCAP,  in the last 168 hours     Signed:  Gwenyth BenderBLACK,Rashidi Loh M  [name] 06/14/2014, 4:08 PM

## 2014-06-14 NOTE — Progress Notes (Signed)
ANTICOAGULATION CONSULT NOTE - follow up  Pharmacy Consult for Coumadin (chronic Rx PTA) Indication: Atrial Fibrillation, H/O PE  Allergies  Allergen Reactions  . Nitroglycerin Other (See Comments)    CAUSED NUMBNESS ALL OVER  . Doxycycline Itching  . Flexeril [Cyclobenzaprine] Other (See Comments)    Sweating, Lightheaded   . Ibuprofen Other (See Comments)    Increase BP, dizziness, lightheaded  . Tramadol Itching  . Tylenol [Acetaminophen] Other (See Comments)    Pt has Hep C  . Vesicare [Solifenacin] Other (See Comments)    Makes my sweat turn yellow  . Amoxil [Amoxicillin] Nausea Only   Patient Measurements: Height: 5\' 5"  (165.1 cm) Weight: 229 lb 14.4 oz (104.282 kg) IBW/kg (Calculated) : 57  Vital Signs: Temp: 97.6 F (36.4 C) (07/09 0614) Temp src: Oral (07/09 0614) BP: 102/65 mmHg (07/09 0614) Pulse Rate: 71 (07/09 0614)  Labs:  Recent Labs  06/12/14 2137 06/13/14 0113 06/13/14 0605 06/13/14 1241 06/14/14 0632  HGB 13.4  --  13.7  --  14.8  HCT 39.6  --  41.3  --  44.4  PLT 201  --  194  --  230  LABPROT 18.3*  --   --   --  19.6*  INR 1.52*  --   --   --  1.66*  CREATININE 0.82  --  0.80  --  0.76  TROPONINI 0.40* 0.36* 0.33* 0.32*  --    Estimated Creatinine Clearance: 101.9 ml/min (by C-G formula based on Cr of 0.76).  Medical History: Past Medical History  Diagnosis Date  . COPD (chronic obstructive pulmonary disease)     Home O2 3L  . Coronary atherosclerosis of native coronary artery     Cardiac catheterization 07/2012 - LAD 40%; small D2 with 60 to 70% ostial; OM1 30 to 40%; RCA 20%; PL1 40%   . Hypothyroidism   . Hepatitis C   . Pneumonia   . Cirrhosis   . Type 2 diabetes mellitus   . GERD (gastroesophageal reflux disease)   . Headache(784.0)   . Anxiety   . Atrial fibrillation and flutter   . Chronic pain   . PE (pulmonary embolism)   . Chronic anticoagulation   . History of medication noncompliance   . SSS (sick sinus syndrome)      Biotronik pacemaker  . Cardiomyopathy     LVEF 40-45%   Medications:  Prescriptions prior to admission  Medication Sig Dispense Refill  . albuterol (PROVENTIL HFA;VENTOLIN HFA) 108 (90 BASE) MCG/ACT inhaler Inhale 2 puffs into the lungs every 6 (six) hours as needed for wheezing or shortness of breath.       . budesonide-formoterol (SYMBICORT) 80-4.5 MCG/ACT inhaler Inhale 2 puffs into the lungs 2 (two) times daily.      . metoCLOPramide (REGLAN) 10 MG tablet Take 1 tablet (10 mg total) by mouth every 6 (six) hours as needed for nausea.  30 tablet  0  . omeprazole (PRILOSEC) 40 MG capsule Take 40 mg by mouth daily.      Marland Kitchen. oxyCODONE (OXY IR/ROXICODONE) 5 MG immediate release tablet Take 1 tablet (5 mg total) by mouth every 4 (four) hours as needed for severe pain.  12 tablet  0  . potassium chloride SA (K-DUR,KLOR-CON) 20 MEQ tablet Take 1 tablet (20 mEq total) by mouth daily.  7 tablet  0  . QUEtiapine (SEROQUEL) 25 MG tablet Take 50 mg by mouth 2 (two) times daily.      Marland Kitchen. tiotropium (  SPIRIVA) 18 MCG inhalation capsule Place 18 mcg into inhaler and inhale daily.      Marland Kitchen torsemide (DEMADEX) 20 MG tablet Take 2 tablets (40 mg total) by mouth 2 (two) times daily.  120 tablet  0  . warfarin (COUMADIN) 4 MG tablet Take 4 mg by mouth at bedtime.       Assessment: 48yo female on chronic Coumadin PTA for h/o afib and PE.  Home dose is listed above.  INR is subtherapeutic on admission but is now trending up after dose increase.  Suspect noncompliance / missed doses PTA.    Goal of Therapy:  INR 2-3 Monitor platelets by anticoagulation protocol: Yes   Plan:  Coumadin 5mg  po today x 1 (dose increase) INR daily  Valrie Hart A 06/14/2014,11:44 AM

## 2014-06-24 ENCOUNTER — Emergency Department (HOSPITAL_COMMUNITY)
Admission: EM | Admit: 2014-06-24 | Discharge: 2014-06-24 | Disposition: A | Discharge status: Home / Self Care | Payer: Medicaid Other | Attending: Emergency Medicine | Admitting: Emergency Medicine

## 2014-06-24 ENCOUNTER — Encounter (HOSPITAL_COMMUNITY): Payer: Self-pay | Admitting: Emergency Medicine

## 2014-06-24 ENCOUNTER — Emergency Department (HOSPITAL_COMMUNITY): Payer: Medicaid Other

## 2014-06-24 DIAGNOSIS — Z9981 Dependence on supplemental oxygen: Secondary | ICD-10-CM | POA: Insufficient documentation

## 2014-06-24 DIAGNOSIS — S3981XA Other specified injuries of abdomen, initial encounter: Secondary | ICD-10-CM | POA: Diagnosis not present

## 2014-06-24 DIAGNOSIS — K219 Gastro-esophageal reflux disease without esophagitis: Secondary | ICD-10-CM | POA: Diagnosis not present

## 2014-06-24 DIAGNOSIS — G8929 Other chronic pain: Secondary | ICD-10-CM | POA: Diagnosis not present

## 2014-06-24 DIAGNOSIS — Z86711 Personal history of pulmonary embolism: Secondary | ICD-10-CM | POA: Insufficient documentation

## 2014-06-24 DIAGNOSIS — IMO0002 Reserved for concepts with insufficient information to code with codable children: Secondary | ICD-10-CM | POA: Diagnosis not present

## 2014-06-24 DIAGNOSIS — Z8639 Personal history of other endocrine, nutritional and metabolic disease: Secondary | ICD-10-CM | POA: Diagnosis not present

## 2014-06-24 DIAGNOSIS — W19XXXA Unspecified fall, initial encounter: Secondary | ICD-10-CM

## 2014-06-24 DIAGNOSIS — Z862 Personal history of diseases of the blood and blood-forming organs and certain disorders involving the immune mechanism: Secondary | ICD-10-CM | POA: Insufficient documentation

## 2014-06-24 DIAGNOSIS — R05 Cough: Secondary | ICD-10-CM | POA: Diagnosis not present

## 2014-06-24 DIAGNOSIS — R42 Dizziness and giddiness: Secondary | ICD-10-CM | POA: Diagnosis not present

## 2014-06-24 DIAGNOSIS — R296 Repeated falls: Secondary | ICD-10-CM | POA: Insufficient documentation

## 2014-06-24 DIAGNOSIS — J449 Chronic obstructive pulmonary disease, unspecified: Secondary | ICD-10-CM | POA: Diagnosis not present

## 2014-06-24 DIAGNOSIS — Z8701 Personal history of pneumonia (recurrent): Secondary | ICD-10-CM | POA: Insufficient documentation

## 2014-06-24 DIAGNOSIS — S298XXA Other specified injuries of thorax, initial encounter: Secondary | ICD-10-CM | POA: Insufficient documentation

## 2014-06-24 DIAGNOSIS — T07XXXA Unspecified multiple injuries, initial encounter: Secondary | ICD-10-CM

## 2014-06-24 DIAGNOSIS — Z8659 Personal history of other mental and behavioral disorders: Secondary | ICD-10-CM | POA: Insufficient documentation

## 2014-06-24 DIAGNOSIS — Z8619 Personal history of other infectious and parasitic diseases: Secondary | ICD-10-CM | POA: Insufficient documentation

## 2014-06-24 DIAGNOSIS — Z950 Presence of cardiac pacemaker: Secondary | ICD-10-CM | POA: Insufficient documentation

## 2014-06-24 DIAGNOSIS — R059 Cough, unspecified: Secondary | ICD-10-CM | POA: Diagnosis not present

## 2014-06-24 DIAGNOSIS — I2510 Atherosclerotic heart disease of native coronary artery without angina pectoris: Secondary | ICD-10-CM | POA: Insufficient documentation

## 2014-06-24 DIAGNOSIS — Y9389 Activity, other specified: Secondary | ICD-10-CM | POA: Diagnosis not present

## 2014-06-24 DIAGNOSIS — K006 Disturbances in tooth eruption: Secondary | ICD-10-CM | POA: Diagnosis not present

## 2014-06-24 DIAGNOSIS — I4891 Unspecified atrial fibrillation: Secondary | ICD-10-CM | POA: Insufficient documentation

## 2014-06-24 DIAGNOSIS — R197 Diarrhea, unspecified: Secondary | ICD-10-CM | POA: Diagnosis not present

## 2014-06-24 DIAGNOSIS — J4489 Other specified chronic obstructive pulmonary disease: Secondary | ICD-10-CM | POA: Insufficient documentation

## 2014-06-24 DIAGNOSIS — Y92009 Unspecified place in unspecified non-institutional (private) residence as the place of occurrence of the external cause: Secondary | ICD-10-CM | POA: Insufficient documentation

## 2014-06-24 DIAGNOSIS — Z79899 Other long term (current) drug therapy: Secondary | ICD-10-CM | POA: Insufficient documentation

## 2014-06-24 DIAGNOSIS — F17200 Nicotine dependence, unspecified, uncomplicated: Secondary | ICD-10-CM | POA: Insufficient documentation

## 2014-06-24 DIAGNOSIS — Z7901 Long term (current) use of anticoagulants: Secondary | ICD-10-CM | POA: Diagnosis not present

## 2014-06-24 DIAGNOSIS — S8000XA Contusion of unspecified knee, initial encounter: Secondary | ICD-10-CM | POA: Diagnosis not present

## 2014-06-24 DIAGNOSIS — N390 Urinary tract infection, site not specified: Secondary | ICD-10-CM | POA: Diagnosis not present

## 2014-06-24 DIAGNOSIS — S99919A Unspecified injury of unspecified ankle, initial encounter: Secondary | ICD-10-CM | POA: Diagnosis present

## 2014-06-24 DIAGNOSIS — S5000XA Contusion of unspecified elbow, initial encounter: Secondary | ICD-10-CM | POA: Diagnosis not present

## 2014-06-24 DIAGNOSIS — Z9889 Other specified postprocedural states: Secondary | ICD-10-CM | POA: Insufficient documentation

## 2014-06-24 DIAGNOSIS — S8990XA Unspecified injury of unspecified lower leg, initial encounter: Secondary | ICD-10-CM | POA: Diagnosis present

## 2014-06-24 DIAGNOSIS — R110 Nausea: Secondary | ICD-10-CM | POA: Insufficient documentation

## 2014-06-24 LAB — BASIC METABOLIC PANEL
ANION GAP: 11 (ref 5–15)
BUN: 7 mg/dL (ref 6–23)
CALCIUM: 8.6 mg/dL (ref 8.4–10.5)
CO2: 29 mEq/L (ref 19–32)
Chloride: 96 mEq/L (ref 96–112)
Creatinine, Ser: 0.72 mg/dL (ref 0.50–1.10)
Glucose, Bld: 83 mg/dL (ref 70–99)
Potassium: 3.9 mEq/L (ref 3.7–5.3)
SODIUM: 136 meq/L — AB (ref 137–147)

## 2014-06-24 LAB — CBC WITH DIFFERENTIAL/PLATELET
BASOS ABS: 0 10*3/uL (ref 0.0–0.1)
BASOS PCT: 0 % (ref 0–1)
EOS ABS: 0.3 10*3/uL (ref 0.0–0.7)
EOS PCT: 2 % (ref 0–5)
HCT: 41 % (ref 36.0–46.0)
Hemoglobin: 14.2 g/dL (ref 12.0–15.0)
LYMPHS PCT: 22 % (ref 12–46)
Lymphs Abs: 2.6 10*3/uL (ref 0.7–4.0)
MCH: 35.1 pg — AB (ref 26.0–34.0)
MCHC: 34.6 g/dL (ref 30.0–36.0)
MCV: 101.2 fL — ABNORMAL HIGH (ref 78.0–100.0)
Monocytes Absolute: 1 10*3/uL (ref 0.1–1.0)
Monocytes Relative: 8 % (ref 3–12)
Neutro Abs: 8 10*3/uL — ABNORMAL HIGH (ref 1.7–7.7)
Neutrophils Relative %: 68 % (ref 43–77)
PLATELETS: 183 10*3/uL (ref 150–400)
RBC: 4.05 MIL/uL (ref 3.87–5.11)
RDW: 14.2 % (ref 11.5–15.5)
WBC: 11.9 10*3/uL — AB (ref 4.0–10.5)

## 2014-06-24 LAB — URINALYSIS, ROUTINE W REFLEX MICROSCOPIC
Glucose, UA: 100 mg/dL — AB
Hgb urine dipstick: NEGATIVE
NITRITE: POSITIVE — AB
PROTEIN: 30 mg/dL — AB
Specific Gravity, Urine: 1.01 (ref 1.005–1.030)
Urobilinogen, UA: >8 mg/dL — ABNORMAL HIGH (ref 0.0–1.0)
pH: 7 (ref 5.0–8.0)

## 2014-06-24 LAB — PROTIME-INR
INR: 1.7 — AB (ref 0.00–1.49)
Prothrombin Time: 20 seconds — ABNORMAL HIGH (ref 11.6–15.2)

## 2014-06-24 LAB — URINE MICROSCOPIC-ADD ON

## 2014-06-24 MED ORDER — ONDANSETRON 8 MG PO TBDP
8.0000 mg | ORAL_TABLET | Freq: Once | ORAL | Status: AC
  Administered 2014-06-24: 8 mg via ORAL
  Filled 2014-06-24: qty 1

## 2014-06-24 MED ORDER — OXYCODONE-ACETAMINOPHEN 5-325 MG PO TABS: 1.0000 | ORAL_TABLET | ORAL | Status: DC | PRN

## 2014-06-24 MED ORDER — CEPHALEXIN 500 MG PO CAPS
500.0000 mg | ORAL_CAPSULE | Freq: Once | ORAL | Status: AC
  Administered 2014-06-24: 500 mg via ORAL
  Filled 2014-06-24: qty 1

## 2014-06-24 MED ORDER — OXYCODONE-ACETAMINOPHEN 5-325 MG PO TABS
1.0000 | ORAL_TABLET | Freq: Once | ORAL | Status: AC
  Administered 2014-06-24: 1 via ORAL
  Filled 2014-06-24: qty 1

## 2014-06-24 MED ORDER — CEPHALEXIN 500 MG PO CAPS
500.0000 mg | ORAL_CAPSULE | Freq: Four times a day (QID) | ORAL | Status: DC
Start: 2014-06-24 — End: 2014-07-01

## 2014-06-24 NOTE — ED Notes (Signed)
Pt states she fell last night due to being dizzy. Has bruising to both knees and left elbow

## 2014-06-24 NOTE — ED Notes (Signed)
Pt. States "I am on coumadin and haven't had my INR checked recently, can you check it before I leave?" EDP notified.

## 2014-06-24 NOTE — Discharge Instructions (Signed)
Use heat, on the sore area 3-4 times a day. Start taking the antibiotic prescription in the morning.   Urinary Tract Infection Urinary tract infections (UTIs) can develop anywhere along your urinary tract. Your urinary tract is your body's drainage system for removing wastes and extra water. Your urinary tract includes two kidneys, two ureters, a bladder, and a urethra. Your kidneys are a pair of bean-shaped organs. Each kidney is about the size of your fist. They are located below your ribs, one on each side of your spine. CAUSES Infections are caused by microbes, which are microscopic organisms, including fungi, viruses, and bacteria. These organisms are so small that they can only be seen through a microscope. Bacteria are the microbes that most commonly cause UTIs. SYMPTOMS  Symptoms of UTIs may vary by age and gender of the patient and by the location of the infection. Symptoms in young women typically include a frequent and intense urge to urinate and a painful, burning feeling in the bladder or urethra during urination. Older women and men are more likely to be tired, shaky, and weak and have muscle aches and abdominal pain. A fever may mean the infection is in your kidneys. Other symptoms of a kidney infection include pain in your back or sides below the ribs, nausea, and vomiting. DIAGNOSIS To diagnose a UTI, your caregiver will ask you about your symptoms. Your caregiver also will ask to provide a urine sample. The urine sample will be tested for bacteria and white blood cells. White blood cells are made by your body to help fight infection. TREATMENT  Typically, UTIs can be treated with medication. Because most UTIs are caused by a bacterial infection, they usually can be treated with the use of antibiotics. The choice of antibiotic and length of treatment depend on your symptoms and the type of bacteria causing your infection. HOME CARE INSTRUCTIONS  If you were prescribed antibiotics,  take them exactly as your caregiver instructs you. Finish the medication even if you feel better after you have only taken some of the medication.  Drink enough water and fluids to keep your urine clear or pale yellow.  Avoid caffeine, tea, and carbonated beverages. They tend to irritate your bladder.  Empty your bladder often. Avoid holding urine for long periods of time.  Empty your bladder before and after sexual intercourse.  After a bowel movement, women should cleanse from front to back. Use each tissue only once. SEEK MEDICAL CARE IF:   You have back pain.  You develop a fever.  Your symptoms do not begin to resolve within 3 days. SEEK IMMEDIATE MEDICAL CARE IF:   You have severe back pain or lower abdominal pain.  You develop chills.  You have nausea or vomiting.  You have continued burning or discomfort with urination. MAKE SURE YOU:   Understand these instructions.  Will watch your condition.  Will get help right away if you are not doing well or get worse. Document Released: 09/02/2005 Document Revised: 05/24/2012 Document Reviewed: 01/01/2012 [facility] Patient Information 2015 [employer] [place]. This information is not intended to replace advice given to you by your health care provider. Make sure you discuss any questions you have with your health care provider. Contusion A contusion is a deep bruise. Contusions are the result of an injury that caused bleeding under the skin. The contusion may turn blue, purple, or yellow. Minor injuries will give you a painless contusion, but more severe contusions may stay painful and swollen for a  few weeks.  CAUSES  A contusion is usually caused by a blow, trauma, or direct force to an area of the body. SYMPTOMS   Swelling and redness of the injured area.  Bruising of the injured area.  Tenderness and soreness of the injured area.  Pain. DIAGNOSIS  The diagnosis can be made by taking a history and physical exam. An  X-ray, CT scan, or MRI may be needed to determine if there were any associated injuries, such as fractures. TREATMENT  Specific treatment will depend on what area of the body was injured. In general, the best treatment for a contusion is resting, icing, elevating, and applying cold compresses to the injured area. Over-the-counter medicines may also be recommended for pain control. Ask your caregiver what the best treatment is for your contusion. HOME CARE INSTRUCTIONS   Put ice on the injured area.  Put ice in a plastic bag.  Place a towel between your skin and the bag.  Leave the ice on for 15-20 minutes, 3-4 times a day, or as directed by your health care provider.  Only take over-the-counter or prescription medicines for pain, discomfort, or fever as directed by your caregiver. Your caregiver may recommend avoiding anti-inflammatory medicines (aspirin, ibuprofen, and naproxen) for 48 hours because these medicines may increase bruising.  Rest the injured area.  If possible, elevate the injured area to reduce swelling. SEEK IMMEDIATE MEDICAL CARE IF:   You have increased bruising or swelling.  You have pain that is getting worse.  Your swelling or pain is not relieved with medicines. MAKE SURE YOU:   Understand these instructions.  Will watch your condition.  Will get help right away if you are not doing well or get worse. Document Released: 09/02/2005 Document Revised: 11/28/2013 Document Reviewed: 09/28/2011 [facility] Patient Information 2015 [place], [place]. This information is not intended to replace advice given to you by your health care provider. Make sure you discuss any questions you have with your health care provider.

## 2014-06-24 NOTE — ED Provider Notes (Signed)
CSN: [id number]     Arrival date & time 06/24/14  1430 History  This chart was scribed for Flint MelterElliott L Jastin Fore, MD by Chestine SporeSoijett Blue, ED Scribe. The patient was seen in room APA17/[facility]17 at 4:37 PM.    Chief Complaint  Patient presents with  . Fall     The history is provided by the patient. No language interpreter was used.   HPI Comments: Kim [name] is a 48 y.o. female who presents to the Emergency Department complaining of a fall that occurred last night. She states that she fell last night due to being dizzy from not eating as much laterly. She states that she has bruising to both knees and left elbow. She states that it hurts to walk on both knees and her elbow hurts to move as well. She states that her right thumb hurts also. She states that [facility] and she had not been eating lately. She states that she left and went home and went to the bathroom. She states that when she tried to get off the toilet she fell. She states that her son had to help her up off the floor.  She states that she has associated symptoms of dizziness, nausea, cough, non- bloody diarrhea 4-5. She denies LOC or hitting her head when she fell. She states that she does not know if she injured her neck, back, or head.  She states that she has not had anything to eat today. She states that she ate a chocolate pudding yesterday and it "ran right through" her. She states that she did not take any pain medications to alleviate her symptoms. She states that she does smoke.  She states that she uses O2 at home.  She denies fever and vomiting.   PCP- Dorian HeckleGILLIAM, RYAN D, MD   Past Medical History  Diagnosis Date  . COPD (chronic obstructive pulmonary disease)     Home O2 3L  . Coronary atherosclerosis of native coronary artery     Cardiac catheterization 07/2012 - LAD 40%; small D2 with 60 to 70% ostial; OM1 30 to 40%; RCA 20%; PL1 40%   . Hypothyroidism   . Hepatitis C   . Pneumonia   . Cirrhosis    . Type 2 diabetes mellitus   . GERD (gastroesophageal reflux disease)   . Headache(784.0)   . Anxiety   . Atrial fibrillation and flutter   . Chronic pain   . PE (pulmonary embolism)   . Chronic anticoagulation   . History of medication noncompliance   . SSS (sick sinus syndrome)     Biotronik pacemaker  . Cardiomyopathy     LVEF 40-45%   Past Surgical History  Procedure Laterality Date  . Cholecystectomy    . Pacemaker insertion  02/2012    Biotronik PPM   . Cardiac electrophysiology study and ablation    . Esophagogastroduodenoscopy  Jan 2010    Dr. Arlyce DiceKaplan: multiple gastric erosions, benign path  . Esophagogastroduodenoscopy N/A 01/10/2014    NWG:NFAOZHYQRMR:Patulous EG junction. Query short segment Barrett's esophagus-status post pinch biopsy as described above. Hiatal hernia; otherwise negative EGD   Family History  Problem Relation Age of Onset  . Coronary artery disease Father [id number]  . Coronary artery disease Mother [id number]  . Coronary artery disease Brother   . Colon cancer Neg Hx    History  Substance Use Topics  . Smoking status: Current Every Day Smoker -- 0.50 packs/day for 30 years  Types: Cigarettes  . Smokeless tobacco: Current User    Types: Snuff  . Alcohol Use: No   OB History   Grav Para Term Preterm Abortions TAB SAB Ect Mult Living                 Review of Systems  Constitutional: Negative for fever.  Respiratory: Positive for cough.   Gastrointestinal: Positive for nausea and diarrhea. Negative for vomiting.  Skin: Positive for color change (bruising to both knees and left elbow).  Neurological: Positive for dizziness.  All other systems reviewed and are negative.     Allergies  Nitroglycerin; Doxycycline; Flexeril; Ibuprofen; Tramadol; Tylenol; Vesicare; and Amoxil  Home Medications   Prior to Admission medications   Medication Sig Start Date End Date Taking? Authorizing Provider  budesonide-formoterol (SYMBICORT) 80-4.5 MCG/ACT inhaler Inhale 2  puffs into the lungs 2 (two) times daily.   Yes Historical Provider, MD  furosemide (LASIX) 80 MG tablet Take 1 tablet (80 mg total) by mouth 2 (two) times daily. 06/14/14  Yes Standley Brooking, MD  metoCLOPramide (REGLAN) 10 MG tablet Take 1 tablet (10 mg total) by mouth every 6 (six) hours as needed for nausea. 05/13/14  Yes Dione Booze, MD  metoprolol succinate (TOPROL-XL) 25 MG 24 hr tablet Take 1 tablet (25 mg total) by mouth daily. 06/14/14  Yes Lesle Chris Black, NP  omeprazole (PRILOSEC) 40 MG capsule Take 40 mg by mouth daily.   Yes Historical Provider, MD  potassium chloride SA (K-DUR,KLOR-CON) 20 MEQ tablet Take 1 tablet (20 mEq total) by mouth daily. 06/07/14  Yes Lyanne Co, MD  QUEtiapine (SEROQUEL) 25 MG tablet Take 50 mg by mouth 2 (two) times daily. 12/08/13  Yes Historical Provider, MD  tiotropium (SPIRIVA) 18 MCG inhalation capsule Place 18 mcg into inhaler and inhale daily.   Yes Historical Provider, MD  warfarin (COUMADIN) 5 MG tablet Take 1 tablet (5 mg total) by mouth daily. 06/14/14  Yes Gwenyth Bender, NP  albuterol (PROVENTIL HFA;VENTOLIN HFA) 108 (90 BASE) MCG/ACT inhaler Inhale 2 puffs into the lungs every 6 (six) hours as needed for wheezing or shortness of breath.     Historical Provider, MD  cephALEXin (KEFLEX) 500 MG capsule Take 1 capsule (500 mg total) by mouth 4 (four) times daily. 06/24/14   Flint Melter, MD  oxyCODONE-acetaminophen (PERCOCET) 5-325 MG per tablet Take 1 tablet by mouth every 4 (four) hours as needed. 06/24/14   Flint Melter, MD   BP 118/76  Pulse 72  Temp(Src) 98.8 F (37.1 C) (Oral)  Resp 20  Ht 5\' 6"  (1.676 m)  Wt 230 lb (104.327 kg)  BMI 37.14 kg/m2  SpO2 98%  LMP 02/08/2007  Physical Exam  Nursing note and vitals reviewed. Constitutional: She is oriented to person, place, and time. She appears well-developed and well-nourished.  HENT:  Head: Normocephalic and atraumatic.  Poor dentition.   Eyes: Conjunctivae and EOM are normal. Pupils are  equal, round, and reactive to light.  Neck: Normal range of motion and phonation normal. Neck supple.  Cardiovascular: Normal rate, regular rhythm and intact distal pulses.   Pulmonary/Chest: Effort normal and breath sounds normal. She exhibits no tenderness.  Left and right ribs tender. No associated crepitus or deformitiy.   Abdominal: Soft. She exhibits no distension and no mass. There is tenderness. There is no guarding.  Mild diffused tenderness. No mass present.  Musculoskeletal: Normal range of motion.  Nl ROM of the cervical spine. Diffused  lumbar tenderness. Left lateral lumbar tenderness. Right first MCP is tender without swelling. Bilaterally nl ROM of knees.   Neurological: She is alert and oriented to person, place, and time. She exhibits normal muscle tone.  Skin: Skin is warm and dry.  Bilateral anterior knee ecchymosis. Left  Olecranon small abrasion not bleeding. Right first MCP is tender without swelling.   Psychiatric: She has a normal mood and affect. Her behavior is normal. Judgment and thought content normal.    ED Course  Procedures (including critical care time)  DIAGNOSTIC STUDIES: Oxygen Saturation is 98% on room air, normal by my interpretation.    COORDINATION OF CARE: 4:49 PM-Discussed treatment plan which includes Percocet, Zofran, Left and Right knee X-Rays, Left elbow X-Ray, Right finger thumb X-Ray and labs with pt at bedside and pt agreed to plan.   Medications  oxyCODONE-acetaminophen (PERCOCET/ROXICET) 5-325 MG per tablet 1 tablet (1 tablet Oral Given 06/24/14 1706)  ondansetron (ZOFRAN-ODT) disintegrating tablet 8 mg (8 mg Oral Given 06/24/14 1706)  cephALEXin (KEFLEX) capsule 500 mg (500 mg Oral Given 06/24/14 1913)    Patient Vitals for the past 24 hrs:  BP Temp Temp src Pulse Resp SpO2 Height Weight  06/24/14 1940 122/89 mmHg - - 80 18 98 % - -  06/24/14 1805 126/78 mmHg 98 F (36.7 C) Oral 78 16 99 % - -  06/24/14 1701 - 98.8 F (37.1 C) - - -  - - -  06/24/14 1451 118/76 mmHg 98.8 F (37.1 C) Oral 72 20 98 % 5\' 6"  (1.676 m) 230 lb (104.327 kg)    7:07 PM Reevaluation with update and discussion. After initial assessment and treatment, an updated evaluation reveals that patient overall feels better. Gray Bernhardt, MD.   Labs Review Labs Reviewed  CBC WITH DIFFERENTIAL - Abnormal; Notable for the following:    WBC 11.9 (*)    MCV 101.2 (*)    MCH 35.1 (*)    Neutro Abs 8.0 (*)    All other components within normal limits  BASIC METABOLIC PANEL - Abnormal; Notable for the following:    Sodium 136 (*)    All other components within normal limits  URINALYSIS, ROUTINE W REFLEX MICROSCOPIC - Abnormal; Notable for the following:    Color, Urine ORANGE (*)    Glucose, UA 100 (*)    Bilirubin Urine MODERATE (*)    Ketones, ur TRACE (*)    Protein, ur 30 (*)    Urobilinogen, UA >8.0 (*)    Nitrite POSITIVE (*)    Leukocytes, UA TRACE (*)    All other components within normal limits  URINE MICROSCOPIC-ADD ON - Abnormal; Notable for the following:    Squamous Epithelial / LPF FEW (*)    Bacteria, UA FEW (*)    All other components within normal limits  URINE CULTURE  PROTIME-INR    Imaging Review Dg Elbow Complete Left  06/24/2014   CLINICAL DATA:  Left elbow pain after fall.  EXAM: LEFT ELBOW - COMPLETE 3+ VIEW  COMPARISON:  None.  FINDINGS: There is no evidence of fracture, dislocation, or joint effusion. There is no evidence of arthropathy or other focal bone abnormality. Soft tissues are unremarkable.  IMPRESSION: Normal left elbow.   Electronically Signed   By: Roque Lias M.D.   On: 06/24/2014 17:56   Dg Knee Complete 4 Views Left  06/24/2014   CLINICAL DATA:  Fall with left knee pain and bruising.  EXAM: LEFT KNEE - COMPLETE 4+  VIEW  COMPARISON:  None.  FINDINGS: No joint effusion or fracture.  No degenerative changes.  IMPRESSION: No acute osseous or joint abnormality.   Electronically Signed   By: Leanna Battles M.D.    On: 06/24/2014 17:54   Dg Knee Complete 4 Views Right  06/24/2014   CLINICAL DATA:  Fall.  Right anterior knee pain and bruising.  EXAM: RIGHT KNEE - COMPLETE 4+ VIEW  COMPARISON:  None.  FINDINGS: Mild marginal spurring in the medial compartment with mild medial compartmental loss of articular space.  Trace knee effusion.  Minimal spurring of the patella.  IMPRESSION: 1. Trace knee effusion. 2. Mild degenerative findings in the medial compartment. 3. No acute bony findings are identified.   Electronically Signed   By: Herbie Baltimore M.D.   On: 06/24/2014 17:55   Dg Finger Thumb Right  06/24/2014   CLINICAL DATA:  Fall with right thumb pain.  EXAM: RIGHT THUMB 2+V  COMPARISON:  None.  FINDINGS: No acute osseous or joint abnormality.  IMPRESSION: No acute findings.   Electronically Signed   By: Leanna Battles M.D.   On: 06/24/2014 17:55     EKG Interpretation None      MDM   Final diagnoses:  Fall at home, initial encounter  Contusion, multiple sites  Urinary tract infection without hematuria, site unspecified      Nursing Notes Reviewed/ Care Coordinated Applicable Imaging Reviewed Interpretation of Laboratory Data incorporated into ED treatment  The patient appears reasonably screened and/or stabilized for discharge and I doubt any other medical condition or other [facility] requiring further screening, evaluation, or treatment in the ED at this time prior to discharge.   Plan: Home Medications- Keflex, Percocet; Home Treatments- rest; return here if the recommended treatment, does not improve the symptoms; Recommended follow up- PCP check up in 1 week  I personally performed the services described in this documentation, which was scribed in my presence. The recorded information has been reviewed and is accurate.    Flint Melter, MD 06/24/14 [phone]

## 2014-06-26 LAB — URINE CULTURE
Colony Count: NO GROWTH
Culture: NO GROWTH

## 2014-06-27 ENCOUNTER — Encounter: Payer: Self-pay | Admitting: Physician Assistant

## 2014-06-28 ENCOUNTER — Observation Stay (HOSPITAL_COMMUNITY)
Admission: EM | Admit: 2014-06-28 | Discharge: 2014-07-01 | Disposition: A | Discharge status: Home / Self Care | Payer: Medicaid Other | Attending: Emergency Medicine | Admitting: Emergency Medicine

## 2014-06-28 ENCOUNTER — Emergency Department (HOSPITAL_COMMUNITY): Payer: Medicaid Other

## 2014-06-28 ENCOUNTER — Encounter (HOSPITAL_COMMUNITY): Payer: Self-pay | Admitting: Emergency Medicine

## 2014-06-28 DIAGNOSIS — Z9119 Patient's noncompliance with other medical treatment and regimen: Secondary | ICD-10-CM | POA: Insufficient documentation

## 2014-06-28 DIAGNOSIS — R209 Unspecified disturbances of skin sensation: Secondary | ICD-10-CM | POA: Insufficient documentation

## 2014-06-28 DIAGNOSIS — Z91199 Patient's noncompliance with other medical treatment and regimen due to unspecified reason: Secondary | ICD-10-CM | POA: Insufficient documentation

## 2014-06-28 DIAGNOSIS — Z881 Allergy status to other antibiotic agents status: Secondary | ICD-10-CM | POA: Insufficient documentation

## 2014-06-28 DIAGNOSIS — M25579 Pain in unspecified ankle and joints of unspecified foot: Secondary | ICD-10-CM | POA: Insufficient documentation

## 2014-06-28 DIAGNOSIS — I5022 Chronic systolic (congestive) heart failure: Secondary | ICD-10-CM

## 2014-06-28 DIAGNOSIS — Z8701 Personal history of pneumonia (recurrent): Secondary | ICD-10-CM | POA: Diagnosis not present

## 2014-06-28 DIAGNOSIS — Z79899 Other long term (current) drug therapy: Secondary | ICD-10-CM | POA: Diagnosis not present

## 2014-06-28 DIAGNOSIS — R072 Precordial pain: Secondary | ICD-10-CM

## 2014-06-28 DIAGNOSIS — F17200 Nicotine dependence, unspecified, uncomplicated: Secondary | ICD-10-CM | POA: Insufficient documentation

## 2014-06-28 DIAGNOSIS — Z888 Allergy status to other drugs, medicaments and biological substances status: Secondary | ICD-10-CM | POA: Insufficient documentation

## 2014-06-28 DIAGNOSIS — F411 Generalized anxiety disorder: Secondary | ICD-10-CM | POA: Diagnosis not present

## 2014-06-28 DIAGNOSIS — H5460 Unqualified visual loss, one eye, unspecified: Secondary | ICD-10-CM | POA: Insufficient documentation

## 2014-06-28 DIAGNOSIS — G8929 Other chronic pain: Secondary | ICD-10-CM | POA: Insufficient documentation

## 2014-06-28 DIAGNOSIS — Z792 Long term (current) use of antibiotics: Secondary | ICD-10-CM | POA: Diagnosis not present

## 2014-06-28 DIAGNOSIS — K219 Gastro-esophageal reflux disease without esophagitis: Secondary | ICD-10-CM | POA: Insufficient documentation

## 2014-06-28 DIAGNOSIS — Z86711 Personal history of pulmonary embolism: Secondary | ICD-10-CM | POA: Insufficient documentation

## 2014-06-28 DIAGNOSIS — F418 Other specified anxiety disorders: Secondary | ICD-10-CM | POA: Diagnosis present

## 2014-06-28 DIAGNOSIS — Z7901 Long term (current) use of anticoagulants: Secondary | ICD-10-CM

## 2014-06-28 DIAGNOSIS — I4891 Unspecified atrial fibrillation: Secondary | ICD-10-CM | POA: Diagnosis present

## 2014-06-28 DIAGNOSIS — R079 Chest pain, unspecified: Secondary | ICD-10-CM | POA: Diagnosis present

## 2014-06-28 DIAGNOSIS — I25119 Atherosclerotic heart disease of native coronary artery with unspecified angina pectoris: Secondary | ICD-10-CM

## 2014-06-28 DIAGNOSIS — J449 Chronic obstructive pulmonary disease, unspecified: Secondary | ICD-10-CM | POA: Diagnosis present

## 2014-06-28 DIAGNOSIS — Z8619 Personal history of other infectious and parasitic diseases: Secondary | ICD-10-CM | POA: Diagnosis not present

## 2014-06-28 DIAGNOSIS — B1920 Unspecified viral hepatitis C without hepatic coma: Secondary | ICD-10-CM

## 2014-06-28 DIAGNOSIS — Z950 Presence of cardiac pacemaker: Secondary | ICD-10-CM | POA: Diagnosis present

## 2014-06-28 DIAGNOSIS — E119 Type 2 diabetes mellitus without complications: Secondary | ICD-10-CM | POA: Diagnosis not present

## 2014-06-28 DIAGNOSIS — R778 Other specified abnormalities of plasma proteins: Secondary | ICD-10-CM | POA: Diagnosis present

## 2014-06-28 DIAGNOSIS — R52 Pain, unspecified: Secondary | ICD-10-CM | POA: Insufficient documentation

## 2014-06-28 DIAGNOSIS — J441 Chronic obstructive pulmonary disease with (acute) exacerbation: Secondary | ICD-10-CM | POA: Diagnosis not present

## 2014-06-28 DIAGNOSIS — R7989 Other specified abnormal findings of blood chemistry: Secondary | ICD-10-CM | POA: Diagnosis present

## 2014-06-28 DIAGNOSIS — J439 Emphysema, unspecified: Secondary | ICD-10-CM

## 2014-06-28 DIAGNOSIS — H5461 Unqualified visual loss, right eye, normal vision left eye: Secondary | ICD-10-CM

## 2014-06-28 DIAGNOSIS — I2510 Atherosclerotic heart disease of native coronary artery without angina pectoris: Secondary | ICD-10-CM | POA: Insufficient documentation

## 2014-06-28 LAB — CBC WITH DIFFERENTIAL/PLATELET
Basophils Absolute: 0 10*3/uL (ref 0.0–0.1)
Basophils Relative: 0 % (ref 0–1)
Eosinophils Absolute: 0.1 10*3/uL (ref 0.0–0.7)
Eosinophils Relative: 2 % (ref 0–5)
HCT: 38.7 % (ref 36.0–46.0)
Hemoglobin: 13.4 g/dL (ref 12.0–15.0)
Lymphocytes Relative: 27 % (ref 12–46)
Lymphs Abs: 1.9 10*3/uL (ref 0.7–4.0)
MCH: 35.1 pg — ABNORMAL HIGH (ref 26.0–34.0)
MCHC: 34.6 g/dL (ref 30.0–36.0)
MCV: 101.3 fL — ABNORMAL HIGH (ref 78.0–100.0)
Monocytes Absolute: 0.5 10*3/uL (ref 0.1–1.0)
Monocytes Relative: 7 % (ref 3–12)
Neutro Abs: 4.6 10*3/uL (ref 1.7–7.7)
Neutrophils Relative %: 64 % (ref 43–77)
Platelets: 215 10*3/uL (ref 150–400)
RBC: 3.82 MIL/uL — ABNORMAL LOW (ref 3.87–5.11)
RDW: 14.3 % (ref 11.5–15.5)
WBC: 7.2 10*3/uL (ref 4.0–10.5)

## 2014-06-28 LAB — BASIC METABOLIC PANEL
Anion gap: 9 (ref 5–15)
BUN: 8 mg/dL (ref 6–23)
CO2: 32 mEq/L (ref 19–32)
Calcium: 8 mg/dL — ABNORMAL LOW (ref 8.4–10.5)
Chloride: 100 mEq/L (ref 96–112)
Creatinine, Ser: 0.89 mg/dL (ref 0.50–1.10)
GFR calc Af Amer: 87 mL/min — ABNORMAL LOW (ref >90)
GFR calc non Af Amer: 75 mL/min — ABNORMAL LOW (ref >90)
Glucose, Bld: 109 mg/dL — ABNORMAL HIGH (ref 70–99)
Potassium: 3.5 mEq/L — ABNORMAL LOW (ref 3.7–5.3)
Sodium: 141 mEq/L (ref 137–147)

## 2014-06-28 LAB — PROTIME-INR
INR: 2.14 — ABNORMAL HIGH (ref 0.00–1.49)
Prothrombin Time: 23.9 seconds — ABNORMAL HIGH (ref 11.6–15.2)

## 2014-06-28 LAB — TROPONIN I: Troponin I: 0.39 ng/mL (ref <0.30)

## 2014-06-28 LAB — PRO B NATRIURETIC PEPTIDE: Pro B Natriuretic peptide (BNP): 1712 pg/mL — ABNORMAL HIGH (ref 0–125)

## 2014-06-28 MED ORDER — MORPHINE SULFATE 4 MG/ML IJ SOLN
5.0000 mg | Freq: Once | INTRAMUSCULAR | Status: AC
  Administered 2014-06-28: 5 mg via INTRAVENOUS
  Filled 2014-06-28: qty 2

## 2014-06-28 MED ORDER — METHYLPREDNISOLONE SODIUM SUCC 125 MG IJ SOLR
125.0000 mg | Freq: Once | INTRAMUSCULAR | Status: AC
  Administered 2014-06-28: 125 mg via INTRAVENOUS
  Filled 2014-06-28: qty 2

## 2014-06-28 MED ORDER — IPRATROPIUM-ALBUTEROL 0.5-2.5 (3) MG/3ML IN SOLN
6.0000 mL | Freq: Once | RESPIRATORY_TRACT | Status: AC
  Administered 2014-06-28: 6 mL via RESPIRATORY_TRACT
  Filled 2014-06-28: qty 6

## 2014-06-28 MED ORDER — ASPIRIN 81 MG PO CHEW
324.0000 mg | CHEWABLE_TABLET | Freq: Once | ORAL | Status: AC
  Administered 2014-06-28: 324 mg via ORAL
  Filled 2014-06-28: qty 4

## 2014-06-28 NOTE — ED Notes (Signed)
Patient complaining of centralized chest pain radiating into her back since awakening this morning.

## 2014-06-28 NOTE — ED Provider Notes (Signed)
CSN: [id number]     Arrival date & time 06/28/14  2223 History   First MD Initiated Contact with Patient 06/28/14 2300    This chart was scribed for Enid Skeens, MD by Marica Otter, ED Scribe. This patient was seen in room APA04/AP[facility]04 and the patient's care was started at 11:09 PM.  Chief Complaint  Patient presents with  . Chest Pain   The history is provided by the patient. No language interpreter was used.  PCP: Dorian Heckle, MD  HPI Comments: Kim [name] is a 48 y.o. female, with an extensive medical Hx noted below, significant for of A-Fib and flutter, COPD, Hepatitis-C, DM TII,  who presents to the Emergency Department complaining of constant centralized chest pain onset this morning. Pt rates her pain a 9 out of 10 and describes it as a stabbing, sharp pain. Pt also complains of associated tingling in the UE, fever, and pain to the feet bilaterally. Pt reports she is on 2L of O2 at home. Pt denies cough, edema in the lower extremities, neck pain, vision changes, HA, weakness in either side of the body, sore throat. Pt reports she cannot recall the last time she had a stress test, though she notes it has been awhile. Pt reports she is on Coumadin due to a prior blood clot in her lungs.  Past Medical History  Diagnosis Date  . COPD (chronic obstructive pulmonary disease)     Home O2 3L  . Coronary atherosclerosis of native coronary artery     Cardiac catheterization 07/2012 - LAD 40%; small D2 with 60 to 70% ostial; OM1 30 to 40%; RCA 20%; PL1 40%   . Hypothyroidism   . Hepatitis C   . Pneumonia   . Cirrhosis   . Type 2 diabetes mellitus   . GERD (gastroesophageal reflux disease)   . Headache(784.0)   . Anxiety   . Atrial fibrillation and flutter   . Chronic pain   . PE (pulmonary embolism)   . Chronic anticoagulation   . History of medication noncompliance   . SSS (sick sinus syndrome)     Biotronik pacemaker  . Cardiomyopathy     LVEF 40-45%   Past Surgical History   Procedure Laterality Date  . Cholecystectomy    . Pacemaker insertion  02/2012    Biotronik PPM   . Cardiac electrophysiology study and ablation    . Esophagogastroduodenoscopy  Jan 2010    Dr. Arlyce Dice: multiple gastric erosions, benign path  . Esophagogastroduodenoscopy N/A 01/10/2014    IEP:PIRJJOAC EG junction. Query short segment Barrett's esophagus-status post pinch biopsy as described above. Hiatal hernia; otherwise negative EGD   Family History  Problem Relation Age of Onset  . Coronary artery disease Father 39  . Coronary artery disease Mother 59  . Coronary artery disease Brother   . Colon cancer Neg Hx    History  Substance Use Topics  . Smoking status: Current Every Day Smoker -- 0.50 packs/day for 30 years    Types: Cigarettes  . Smokeless tobacco: Current User    Types: Snuff  . Alcohol Use: No   OB History   Grav Para Term Preterm Abortions TAB SAB Ect Mult Living                 Review of Systems  Constitutional: Positive for fever.  HENT: Negative for sore throat.   Eyes: Negative for visual disturbance.  Cardiovascular: Positive for chest pain.  Musculoskeletal: Negative for neck  pain.       Pain to feet, bilaterally   Neurological: Negative for weakness and headaches.       Tingling in UE  All other systems reviewed and are negative.     Allergies  Nitroglycerin; Doxycycline; Flexeril; Ibuprofen; Tramadol; Tylenol; Vesicare; and Amoxil  Home Medications   Prior to Admission medications   Medication Sig Start Date End Date Taking? Authorizing Provider  albuterol (PROVENTIL HFA;VENTOLIN HFA) 108 (90 BASE) MCG/ACT inhaler Inhale 2 puffs into the lungs every 6 (six) hours as needed for wheezing or shortness of breath.     Historical Provider, MD  budesonide-formoterol (SYMBICORT) 80-4.5 MCG/ACT inhaler Inhale 2 puffs into the lungs 2 (two) times daily.    Historical Provider, MD  cephALEXin (KEFLEX) 500 MG capsule Take 1 capsule (500 mg total) by  mouth 4 (four) times daily. 06/24/14   Flint Melter, MD  furosemide (LASIX) 80 MG tablet Take 1 tablet (80 mg total) by mouth 2 (two) times daily. 06/14/14   Standley Brooking, MD  metoCLOPramide (REGLAN) 10 MG tablet Take 1 tablet (10 mg total) by mouth every 6 (six) hours as needed for nausea. 05/13/14   Dione Booze, MD  metoprolol succinate (TOPROL-XL) 25 MG 24 hr tablet Take 1 tablet (25 mg total) by mouth daily. 06/14/14   Gwenyth Bender, NP  omeprazole (PRILOSEC) 40 MG capsule Take 40 mg by mouth daily.    Historical Provider, MD  oxyCODONE-acetaminophen (PERCOCET) 5-325 MG per tablet Take 1 tablet by mouth every 4 (four) hours as needed. 06/24/14   Flint Melter, MD  potassium chloride SA (K-DUR,KLOR-CON) 20 MEQ tablet Take 1 tablet (20 mEq total) by mouth daily. 06/07/14   Lyanne Co, MD  QUEtiapine (SEROQUEL) 25 MG tablet Take 50 mg by mouth 2 (two) times daily. 12/08/13   Historical Provider, MD  tiotropium (SPIRIVA) 18 MCG inhalation capsule Place 18 mcg into inhaler and inhale daily.    Historical Provider, MD  warfarin (COUMADIN) 5 MG tablet Take 1 tablet (5 mg total) by mouth daily. 06/14/14   Gwenyth Bender, NP   Triage Vitals: BP 140/91  Pulse 78  Temp(Src) 97.6 F (36.4 C) (Oral)  Resp 20  Ht 6' (1.829 m)  Wt 230 lb (104.327 kg)  BMI 31.19 kg/m2  SpO2 97%  LMP 02/08/2007 Physical Exam  Nursing note and vitals reviewed. Constitutional: She is oriented to person, place, and time. She appears well-developed and well-nourished. No distress.  HENT:  Head: Normocephalic and atraumatic.  Eyes: EOM are normal. Pupils are equal, round, and reactive to light.  Cloudy pupil on right. Left eye nl visual fields. Minimal vision in all quadrants of right eye.   Neck: Normal range of motion.  Cardiovascular: Normal rate, regular rhythm and normal heart sounds.   Pulmonary/Chest: Effort normal. She has wheezes (expiratory wheeze bilaterally ).  Abdominal: Soft. She exhibits no distension.  There is no tenderness.  Musculoskeletal: Normal range of motion.  Upper, lower compartments are soft; good cap refill; good pulses; generalized aches throughout.   Neurological: She is alert and oriented to person, place, and time.  Equal sensation in checks. Fingers to nose intact bilateral. No drift in the legs, equal strength bilaterally.   Skin: Skin is warm and dry.  Psychiatric: She has a normal mood and affect. Judgment normal.    ED Course  Procedures (including critical care time)   EMERGENCY DEPARTMENT [place] CARDIAC EXAM "Study: Limited Ultrasound of the heart  and pericardium"  INDICATIONS:Dyspnea cp Multiple views of the heart and pericardium were obtained in real-time with a multi-frequency probe.  PERFORMED ZO:XWRUEABY:Myself  IMAGES ARCHIVED?: Yes  FINDINGS: No pericardial effusion, IVC normal and Tamponade physiology absent  LIMITATIONS:  Body habitus  VIEWS USED: Subcostal 4 chamber, Parasternal long axis, Parasternal short axis, Apical 4 chamber  and Inferior Vena Cava  INTERPRETATION: Cardiac activity present, Pericardial effusioin absent and Volume status normal Mild decr EF to normal range   COORDINATION OF CARE: 11:20 PM-Discussed treatment plan which includes imaging and possible admittance with pt at bedside and pt agreed to plan.   Labs Review Labs Reviewed  CBC WITH DIFFERENTIAL - Abnormal; Notable for the following:    RBC 3.82 (*)    MCV 101.3 (*)    MCH 35.1 (*)    All other components within normal limits  BASIC METABOLIC PANEL - Abnormal; Notable for the following:    Potassium 3.5 (*)    Glucose, Bld 109 (*)    Calcium 8.0 (*)    GFR calc non Af Amer 75 (*)    GFR calc Af Amer 87 (*)    All other components within normal limits  TROPONIN I - Abnormal; Notable for the following:    Troponin I 0.39 (*)    All other components within normal limits  PROTIME-INR - Abnormal; Notable for the following:    Prothrombin Time 23.9 (*)    INR 2.14 (*)     All other components within normal limits  PRO B NATRIURETIC PEPTIDE - Abnormal; Notable for the following:    Pro B Natriuretic peptide (BNP) 1712.0 (*)    All other components within normal limits  TROPONIN I    Imaging Review Dg Chest 2 View  06/28/2014   CLINICAL DATA:  Chest pain and shortness of breath. History of smoking.  EXAM: CHEST  2 VIEW  COMPARISON:  Chest radiograph performed 06/12/2014  FINDINGS: The lungs are well-aerated and clear. There is no evidence of focal opacification, pleural effusion or pneumothorax.  The heart is borderline normal in size; the mediastinal contour is within normal limits. A pacemaker is noted at the left chest wall, with leads ending at the right atrium and right ventricle. No acute osseous abnormalities are seen.  IMPRESSION: No acute cardiopulmonary process seen.   Electronically Signed   By: Roanna RaiderJeffery  Chang M.D.   On: 06/28/2014 23:22   Ct Head Wo Contrast  06/29/2014   CLINICAL DATA:  Right-sided visual loss. Cloudiness at the right pupil.  EXAM: CT HEAD WITHOUT CONTRAST  TECHNIQUE: Contiguous axial images were obtained from the base of the skull through the vertex without intravenous contrast.  COMPARISON:  CT of the head performed 07/21/2013  FINDINGS: There is no evidence of acute infarction, mass lesion, or intra- or extra-axial hemorrhage on CT.  Mild subcortical white matter change likely reflects small vessel ischemic microangiopathy.  The posterior fossa, including the cerebellum, brainstem and fourth ventricle, is within normal limits. The third and lateral ventricles, and basal ganglia are unremarkable in appearance. The cerebral hemispheres demonstrate grossly normal gray-white differentiation. No mass effect or midline shift is seen.  There is no evidence of fracture; visualized osseous structures are unremarkable in appearance. The visualized portions of the orbits are within normal limits. The paranasal sinuses and mastoid air cells are  well-aerated. No significant soft tissue abnormalities are seen.  IMPRESSION: 1. No acute intracranial pathology seen on CT. 2. Mild small vessel ischemic microangiopathy.  Electronically Signed   By: Roanna Raider M.D.   On: 06/29/2014 00:25     EKG Interpretation   Date/Time:  Thursday June 28 2014 22:39:20 EDT Ventricular Rate:  75 PR Interval:    QRS Duration: 177 QT Interval:  506 QTC Calculation: 565 R Axis:   -83 Text Interpretation:  Normal sinus rhythm LVH with IVCD, LAD and secondary  repol abnrm Anterolateral infarct, old Prolonged QT interval No  significant change since last tracing Confirmed by Juleen China  MD, STEPHEN  [phone]) on 06/28/2014 10:44:16 PM     Repeat EKG G. done due to persistent chest pain heart rate 75, prolonged QT, left axis, flutter waves seen V1 V2, wide QRS, similar to previous MDM   Final diagnoses:  Acute exacerbation of chronic obstructive pulmonary disease (COPD)  Acute chest pain  Vision loss, right eye   I personally performed the services described in this documentation, which was scribed in my presence. The recorded information has been reviewed and is accurate.  EKG reviewed, similar to previous. Patient with complex medical history including heart disease, lung disease and liver disease presents with primary concern chest pain and known coronary artery disease, on Coumadin. Patient does report exertional chest pain throughout today. Patient has had fairly recent stress test which showed mild reversible defect. Clinically patient has acute COPD exacerbation as well. Patient has known blood clot history no long, last CT angiogram reviewed and no blood clot, INR normal range, vitals unremarkable and I do not feel repeat CT scan at this time would change management.  Nebs and steroids given for COPD, patient on normal 2 L nasal cannula. Aspirin, morphine and serial troponins for chest pain, plan for telemetry observation. Right vision loss diffuse,  patient unsure when this started as she first realized when I covered her left pupil and she was forced to use her right. Clinically it appears likely an cataract, no other focal neuro deficits. Plan for CT head and observation.  On recheck mild improvement chest pain, repeat pain medicines ordered, discussed the case with Dr. Onalee Hua who recommended speak with cardiology prior to admission to see if they would like transfer for possible cath versus keeping the patient here.  Spoke with Dr. Tresa Endo cardiology who recommended serial troponins and possible stress test tomorrow, he is comfortable with patient staying at [facility] and I updated Dr. Onalee Hua who agreed with plan.   Pt improved on recheck.  The patients results and plan were reviewed and discussed.   Any x-rays performed were personally reviewed by myself.   Differential diagnosis were considered with the presenting HPI.  Medications  HYDROmorphone (DILAUDID) injection 1 mg (not administered)  methylPREDNISolone sodium succinate (SOLU-MEDROL) 125 mg/2 mL injection 125 mg (125 mg Intravenous Given 06/28/14 2343)  ipratropium-albuterol (DUONEB) 0.5-2.5 (3) MG/3ML nebulizer solution 6 mL (6 mLs Nebulization Given 06/28/14 2346)  aspirin chewable tablet 324 mg (324 mg Oral Given 06/28/14 2342)  morphine 4 MG/ML injection 5 mg (5 mg Intravenous Given 06/28/14 2345)      Filed Vitals:   06/28/14 2234 06/28/14 2300 06/28/14 2330 06/28/14 2331  BP: 140/91 102/44 114/68 114/68  Pulse: 78 89 74 74  Temp: 97.6 F (36.4 C)     TempSrc: Oral     Resp: 20 23 23 15   Height: 6' (1.829 m)     Weight: 230 lb (104.327 kg)     SpO2: 97% 100% 100% 100%    Admission/ observation were discussed with the admitting physician,  patient and/or family and they are comfortable with the plan.     Enid Skeens, MD 06/29/14 [phone]

## 2014-06-28 NOTE — ED Provider Notes (Signed)
MSE was initiated and I personally evaluated the patient and placed orders (if any) at  10:48 PM on June 28, 2014.  The patient appears stable so that the remainder of the MSE may be completed by another provider.   47yF with CP. Noticed when woke up this morning. Constant through out day. Sharp. No change with exertion. Not greatest historian. Previous issues with medication compliance. Did not take any of her meds today. Multiple medical problems. Recent admit and had low risk stress test 06/14/14.  Mildly reversible defect in the apical anterior and mid anterior walls, which may represent a mild degree of ischemia vs variable soft tissue attenuation artifact. Cath 8/13 with nonobstructive coronary artery disease. Sounds like plan to medically manage. Seems to have chronic minimally elevated troponin. Initial EKG is not significantly changed from recent. W/u initiated. Further care by subsequent provider.   EKG:  Rhythm: sinus Vent. rate 75 BPM QRS duration 177 ms QT/QTc 506/565 ms LVH with IVCD, secondary repolarization abnormality LAD Comparison: no significant change from recent.      Raeford Razor, MD 06/28/14 2253

## 2014-06-28 NOTE — ED Notes (Signed)
CRITICAL VALUE ALERT  Critical value received:  Troponin 0.39 Date of notification:  06/28/14  Time of notification:  2347  Critical value read back: yes  Nurse who received alert:  Charlette Caffey, RN  MD notified:  Dr. Jodi Mourning  Time:  2774

## 2014-06-29 ENCOUNTER — Encounter (HOSPITAL_COMMUNITY): Payer: Self-pay

## 2014-06-29 DIAGNOSIS — Z7901 Long term (current) use of anticoagulants: Secondary | ICD-10-CM

## 2014-06-29 DIAGNOSIS — Z950 Presence of cardiac pacemaker: Secondary | ICD-10-CM

## 2014-06-29 DIAGNOSIS — R7989 Other specified abnormal findings of blood chemistry: Secondary | ICD-10-CM

## 2014-06-29 DIAGNOSIS — F341 Dysthymic disorder: Secondary | ICD-10-CM

## 2014-06-29 DIAGNOSIS — I509 Heart failure, unspecified: Secondary | ICD-10-CM

## 2014-06-29 DIAGNOSIS — I2510 Atherosclerotic heart disease of native coronary artery without angina pectoris: Secondary | ICD-10-CM

## 2014-06-29 DIAGNOSIS — I209 Angina pectoris, unspecified: Secondary | ICD-10-CM

## 2014-06-29 DIAGNOSIS — I5022 Chronic systolic (congestive) heart failure: Secondary | ICD-10-CM

## 2014-06-29 DIAGNOSIS — R079 Chest pain, unspecified: Secondary | ICD-10-CM

## 2014-06-29 LAB — GLUCOSE, CAPILLARY
GLUCOSE-CAPILLARY: 197 mg/dL — AB (ref 70–99)
Glucose-Capillary: 186 mg/dL — ABNORMAL HIGH (ref 70–99)
Glucose-Capillary: 209 mg/dL — ABNORMAL HIGH (ref 70–99)
Glucose-Capillary: 187 mg/dL — ABNORMAL HIGH (ref 70–99)

## 2014-06-29 LAB — TROPONIN I
TROPONIN I: 0.42 ng/mL — AB (ref <0.30)
TROPONIN I: 0.4 ng/mL — AB (ref <0.30)
Troponin I: 0.39 ng/mL (ref <0.30)

## 2014-06-29 LAB — MAGNESIUM: MAGNESIUM: 1.6 mg/dL (ref 1.5–2.5)

## 2014-06-29 MED ORDER — ASPIRIN EC 325 MG PO TBEC
325.0000 mg | DELAYED_RELEASE_TABLET | Freq: Every day | ORAL | Status: DC
  Administered 2014-06-29 – 2014-07-01 (×3): 325 mg via ORAL
  Filled 2014-06-29 (×3): qty 1

## 2014-06-29 MED ORDER — INSULIN ASPART 100 UNIT/ML ~~LOC~~ SOLN
0.0000 [IU] | Freq: Three times a day (TID) | SUBCUTANEOUS | Status: DC
  Administered 2014-06-29: 2 [IU] via SUBCUTANEOUS
  Administered 2014-06-29: 3 [IU] via SUBCUTANEOUS
  Administered 2014-06-30: 1 [IU] via SUBCUTANEOUS
  Administered 2014-06-30 – 2014-07-01 (×2): 2 [IU] via SUBCUTANEOUS

## 2014-06-29 MED ORDER — POTASSIUM CHLORIDE CRYS ER 20 MEQ PO TBCR
20.0000 meq | EXTENDED_RELEASE_TABLET | Freq: Every day | ORAL | Status: DC
  Administered 2014-06-29 – 2014-07-01 (×3): 20 meq via ORAL
  Filled 2014-06-29 (×3): qty 1

## 2014-06-29 MED ORDER — ALBUTEROL SULFATE (2.5 MG/3ML) 0.083% IN NEBU
3.0000 mL | INHALATION_SOLUTION | Freq: Four times a day (QID) | RESPIRATORY_TRACT | Status: DC | PRN
  Administered 2014-06-29: 3 mL via RESPIRATORY_TRACT
  Filled 2014-06-29 (×3): qty 3

## 2014-06-29 MED ORDER — METOCLOPRAMIDE HCL 10 MG PO TABS: 10.0000 mg | ORAL_TABLET | Freq: Four times a day (QID) | ORAL | Status: DC | PRN

## 2014-06-29 MED ORDER — AMLODIPINE BESYLATE 5 MG PO TABS
2.5000 mg | ORAL_TABLET | Freq: Every day | ORAL | Status: DC
  Administered 2014-06-29 – 2014-07-01 (×3): 2.5 mg via ORAL
  Filled 2014-06-29 (×3): qty 1

## 2014-06-29 MED ORDER — PANTOPRAZOLE SODIUM 40 MG PO TBEC
40.0000 mg | DELAYED_RELEASE_TABLET | Freq: Every day | ORAL | Status: DC
Start: 2014-06-29 — End: 2014-07-01
  Administered 2014-06-29 – 2014-07-01 (×3): 40 mg via ORAL
  Filled 2014-06-29 (×3): qty 1

## 2014-06-29 MED ORDER — HYDROMORPHONE HCL PF 1 MG/ML IJ SOLN
1.0000 mg | Freq: Once | INTRAMUSCULAR | Status: AC
  Administered 2014-06-29: 1 mg via INTRAVENOUS
  Filled 2014-06-29: qty 1

## 2014-06-29 MED ORDER — ONDANSETRON HCL 4 MG/2ML IJ SOLN: 4.0000 mg | Freq: Three times a day (TID) | INTRAMUSCULAR | Status: DC | PRN

## 2014-06-29 MED ORDER — GI COCKTAIL ~~LOC~~: 30.0000 mL | Freq: Four times a day (QID) | ORAL | Status: DC | PRN

## 2014-06-29 MED ORDER — WARFARIN - PHYSICIAN DOSING INPATIENT
Freq: Every day | Status: DC
  Administered 2014-06-30: 18:00:00

## 2014-06-29 MED ORDER — BUDESONIDE-FORMOTEROL FUMARATE 80-4.5 MCG/ACT IN AERO
2.0000 | INHALATION_SPRAY | Freq: Two times a day (BID) | RESPIRATORY_TRACT | Status: DC
  Administered 2014-06-29 – 2014-07-01 (×5): 2 via RESPIRATORY_TRACT
  Filled 2014-06-29: qty 6.9

## 2014-06-29 MED ORDER — ONDANSETRON HCL 4 MG/2ML IJ SOLN
4.0000 mg | Freq: Four times a day (QID) | INTRAMUSCULAR | Status: DC | PRN
  Administered 2014-06-29: 4 mg via INTRAVENOUS
  Filled 2014-06-29: qty 2

## 2014-06-29 MED ORDER — METOPROLOL SUCCINATE ER 25 MG PO TB24
25.0000 mg | ORAL_TABLET | Freq: Every day | ORAL | Status: DC
  Administered 2014-06-29 – 2014-07-01 (×3): 25 mg via ORAL
  Filled 2014-06-29 (×3): qty 1

## 2014-06-29 MED ORDER — QUETIAPINE FUMARATE 25 MG PO TABS
50.0000 mg | ORAL_TABLET | Freq: Two times a day (BID) | ORAL | Status: DC
  Administered 2014-06-29 – 2014-07-01 (×5): 50 mg via ORAL
  Filled 2014-06-29 (×5): qty 2

## 2014-06-29 MED ORDER — FUROSEMIDE 80 MG PO TABS
80.0000 mg | ORAL_TABLET | Freq: Two times a day (BID) | ORAL | Status: DC
  Administered 2014-06-29 – 2014-07-01 (×5): 80 mg via ORAL
  Filled 2014-06-29 (×5): qty 1

## 2014-06-29 MED ORDER — WARFARIN SODIUM 5 MG PO TABS
5.0000 mg | ORAL_TABLET | Freq: Every day | ORAL | Status: DC
  Administered 2014-06-29 – 2014-06-30 (×2): 5 mg via ORAL
  Filled 2014-06-29 (×2): qty 1

## 2014-06-29 MED ORDER — ATORVASTATIN CALCIUM 20 MG PO TABS
20.0000 mg | ORAL_TABLET | Freq: Every day | ORAL | Status: DC
  Administered 2014-06-29 – 2014-06-30 (×2): 20 mg via ORAL
  Filled 2014-06-29 (×2): qty 1

## 2014-06-29 MED ORDER — TIOTROPIUM BROMIDE MONOHYDRATE 18 MCG IN CAPS
18.0000 ug | ORAL_CAPSULE | Freq: Every day | RESPIRATORY_TRACT | Status: DC
  Administered 2014-06-29 – 2014-07-01 (×3): 18 ug via RESPIRATORY_TRACT
  Filled 2014-06-29: qty 5

## 2014-06-29 NOTE — H&P (Signed)
PCP:   Dorian Heckle, MD   Chief Complaint:  Chest pain  HPI: 48 yo female h/o copd, 2 liters oxgyen at home, chf, hep c, cirrhosis, chronic pain, comes in with [facility] no radiation since she woke up this am over 12 hours ago.  No fevers, no cough.  No swelling in her legs.  Some nausea by no vomiting.  Pt has received several doses of morphine in ED and is sedated right now but still reports 10/10 chest pain.  Pt says she ran out of her chronic pain medications over 2 months ago and is having difficulty with getting into her doctor.  She denies using any pain meds at home except tylenol pm prn.  She was here earlier in the month, had lexiscan which showed a small reversible defect that was thought to be attenuation but was o/w negative.  Review of Systems:  Positive and negative as per HPI otherwise all other systems are negative  Past Medical History: Past Medical History  Diagnosis Date  . COPD (chronic obstructive pulmonary disease)     Home O2 3L  . Coronary atherosclerosis of native coronary artery     Cardiac catheterization 07/2012 - LAD 40%; small D2 with 60 to 70% ostial; OM1 30 to 40%; RCA 20%; PL1 40%   . Hypothyroidism   . Hepatitis C   . Pneumonia   . Cirrhosis   . Type 2 diabetes mellitus   . GERD (gastroesophageal reflux disease)   . Headache(784.0)   . Anxiety   . Atrial fibrillation and flutter   . Chronic pain   . PE (pulmonary embolism)   . Chronic anticoagulation   . History of medication noncompliance   . SSS (sick sinus syndrome)     Biotronik pacemaker  . Cardiomyopathy     LVEF 40-45%   Past Surgical History  Procedure Laterality Date  . Cholecystectomy    . Pacemaker insertion  02/2012    Biotronik PPM   . Cardiac electrophysiology study and ablation    . Esophagogastroduodenoscopy  Jan 2010    Dr. Arlyce Dice: multiple gastric erosions, benign path  . Esophagogastroduodenoscopy N/A 01/10/2014    [id number] EG junction. Query short segment Barrett's  esophagus-status post pinch biopsy as described above. Hiatal hernia; otherwise negative EGD    Medications: Prior to Admission medications   Medication Sig Start Date End Date Taking? Authorizing Provider  albuterol (PROVENTIL HFA;VENTOLIN HFA) 108 (90 BASE) MCG/ACT inhaler Inhale 2 puffs into the lungs every 6 (six) hours as needed for wheezing or shortness of breath.     Historical Provider, MD  budesonide-formoterol (SYMBICORT) 80-4.5 MCG/ACT inhaler Inhale 2 puffs into the lungs 2 (two) times daily.    Historical Provider, MD  cephALEXin (KEFLEX) 500 MG capsule Take 1 capsule (500 mg total) by mouth 4 (four) times daily. 06/24/14   Flint Melter, MD  furosemide (LASIX) 80 MG tablet Take 1 tablet (80 mg total) by mouth 2 (two) times daily. 06/14/14   Standley Brooking, MD  metoCLOPramide (REGLAN) 10 MG tablet Take 1 tablet (10 mg total) by mouth every 6 (six) hours as needed for nausea. 05/13/14   Dione Booze, MD  metoprolol succinate (TOPROL-XL) 25 MG 24 hr tablet Take 1 tablet (25 mg total) by mouth daily. 06/14/14   Gwenyth Bender, NP  omeprazole (PRILOSEC) 40 MG capsule Take 40 mg by mouth daily.    Historical Provider, MD  oxyCODONE-acetaminophen (PERCOCET) 5-325 MG per tablet Take 1 tablet  by mouth every 4 (four) hours as needed. 06/24/14   Flint Melter, MD  potassium chloride SA (K-DUR,KLOR-CON) 20 MEQ tablet Take 1 tablet (20 mEq total) by mouth daily. 06/07/14   Lyanne Co, MD  QUEtiapine (SEROQUEL) 25 MG tablet Take 50 mg by mouth 2 (two) times daily. 12/08/13   Historical Provider, MD  tiotropium (SPIRIVA) 18 MCG inhalation capsule Place 18 mcg into inhaler and inhale daily.    Historical Provider, MD  warfarin (COUMADIN) 5 MG tablet Take 1 tablet (5 mg total) by mouth daily. 06/14/14   Gwenyth Bender, NP    Allergies:   Allergies  Allergen Reactions  . Nitroglycerin Other (See Comments)    CAUSED NUMBNESS ALL OVER  . Doxycycline Itching  . Flexeril [Cyclobenzaprine] Other (See  Comments)    Sweating, Lightheaded   . Ibuprofen Other (See Comments)    Increase BP, dizziness, lightheaded  . Tramadol Itching  . Tylenol [Acetaminophen] Other (See Comments)    Pt has Hep C  . Vesicare [Solifenacin] Other (See Comments)    Makes my sweat turn yellow  . Amoxil [Amoxicillin] Nausea Only    Social History:  reports that she has been smoking Cigarettes.  She has a 15 pack-year smoking history. Her smokeless tobacco use includes Snuff. She reports that she does not drink alcohol or use illicit drugs.  Family History: Family History  Problem Relation Age of Onset  . Coronary artery disease Father 36  . Coronary artery disease Mother 74  . Coronary artery disease Brother   . Colon cancer Neg Hx     Physical Exam: Filed Vitals:   06/28/14 2348 06/29/14 0000 06/29/14 0030 06/29/14 0100  BP:  118/71 114/68 113/62  Pulse:   77 78  Temp:      TempSrc:      Resp:   24 23  Height:      Weight:      SpO2: 100%  96% 99%   General appearance: no distress, moderately obese and slowed mentation Head: Normocephalic, without obvious abnormality, atraumatic Eyes: negative Nose: Nares normal. Septum midline. Mucosa normal. No drainage or sinus tenderness. Neck: no JVD and supple, symmetrical, trachea midline Lungs: clear to auscultation bilaterally Heart: regular rate and rhythm, S1, S2 normal, no murmur, click, rub or gallop Abdomen: soft, non-tender; bowel sounds normal; no masses,  no organomegaly Extremities: extremities normal, atraumatic, no cyanosis or edema Pulses: 2+ and symmetric Skin: Skin color, texture, turgor normal. No rashes or lesions Neurologic: Grossly normal sedated likely from morphine    Labs on Admission:   Recent Labs  06/28/14 2310  NA 141  K 3.5*  CL 100  CO2 32  GLUCOSE 109*  BUN 8  CREATININE 0.89  CALCIUM 8.0*    Recent Labs  06/28/14 2310  WBC 7.2  NEUTROABS 4.6  HGB 13.4  HCT 38.7  MCV 101.3*  PLT 215    Recent  Labs  06/28/14 2310  TROPONINI 0.39*   Radiological Exams on Admission: Dg Chest 2 View  06/28/2014   CLINICAL DATA:  Chest pain and shortness of breath. History of smoking.  EXAM: CHEST  2 VIEW  COMPARISON:  Chest radiograph performed 06/12/2014  FINDINGS: The lungs are well-aerated and clear. There is no evidence of focal opacification, pleural effusion or pneumothorax.  The heart is borderline normal in size; the mediastinal contour is within normal limits. A pacemaker is noted at the left chest wall, with leads ending at the right  atrium and right ventricle. No acute osseous abnormalities are seen.  IMPRESSION: No acute cardiopulmonary process seen.   Electronically Signed   By: Roanna RaiderJeffery  Chang M.D.   On: 06/28/2014 23:22   Ct Head Wo Contrast  06/29/2014   CLINICAL DATA:  Right-sided visual loss. Cloudiness at the right pupil.  EXAM: CT HEAD WITHOUT CONTRAST  TECHNIQUE: Contiguous axial images were obtained from the base of the skull through the vertex without intravenous contrast.  COMPARISON:  CT of the head performed 07/21/2013  FINDINGS: There is no evidence of acute infarction, mass lesion, or intra- or extra-axial hemorrhage on CT.  Mild subcortical white matter change likely reflects small vessel ischemic microangiopathy.  The posterior fossa, including the cerebellum, brainstem and fourth ventricle, is within normal limits. The third and lateral ventricles, and basal ganglia are unremarkable in appearance. The cerebral hemispheres demonstrate grossly normal gray-white differentiation. No mass effect or midline shift is seen.  There is no evidence of fracture; visualized osseous structures are unremarkable in appearance. The visualized portions of the orbits are within normal limits. The paranasal sinuses and mastoid air cells are well-aerated. No significant soft tissue abnormalities are seen.  IMPRESSION: 1. No acute intracranial pathology seen on CT. 2. Mild small vessel ischemic  microangiopathy.   Electronically Signed   By: Roanna RaiderJeffery  Chang M.D.   On: 06/29/2014 00:25   Nm Myocar Single W/spect W/wall Motion And Ef  06/14/2014   CLINICAL DATA:  48 year old woman with a history of coronary artery disease and chest pain and repeated episodes of heart failure, referred for an ischemic evaluation.  EXAM: MYOCARDIAL IMAGING WITH SPECT (REST AND PHARMACOLOGIC-STRESS)  GATED LEFT VENTRICULAR WALL MOTION STUDY  LEFT VENTRICULAR EJECTION FRACTION  TECHNIQUE: Standard myocardial SPECT imaging was performed after resting intravenous injection of 10 mCi [id number] sestamibi. Subsequently, intravenous infusion of Lexiscan was performed under the supervision of the Cardiology staff. At peak effect of the drug, 30 mCi [id number] sestamibi was injected intravenously and standard myocardial SPECT imaging was performed. Quantitative gated imaging was also performed to evaluate left ventricular wall motion, and estimate left ventricular ejection fraction.  COMPARISON:  None.  FINDINGS: The patient was stressed according to the Lexiscan protocol. The heart rate ranged from 73 up to 85 beats per min. The blood pressure range from 120-125/76-85. No chest pain was reported.  Baseline tracings show a ventricular paced rhythm with no significant changes with stress. There appears to be underlying atrial flutter.  Analysis of the raw data shows no significant extracardiac radiotracer uptake. Analysis of the perfusion images demonstrates a small area of mildly decreased uptake in the apical anteroseptal and mid anterior walls. There appears to be complete reversibility when compared to the resting images. Regional wall motion is essentially normal with the exception of abnormal septal motion which is due to ventricular pacing. Therefore, the aforementioned defect may represent a mild degree of apical and anterior wall ischemia versus variable soft tissue attenuation artifact. Left ventricular systolic function is normal,  calculated LVEF 52%.  IMPRESSION: 1.  Low risk Lexiscan Cardiolite stress test.  2. Mildly reversible defect in the apical anterior and mid anterior walls, which may represent a mild degree of ischemia vs variable soft tissue attenuation artifact.  3.  Normal left ventricular systolic function, calculated LVEF 52%.  4.  No chest pain was reported.   Electronically Signed   By: Prentice DockerSuresh  Koneswaran   On: 06/14/2014 14:29   Assessment/Plan  48 yo female with multiple risk factors  and atypical chest pain  Principal Problem:   Acute chest pain-  Trop mildly elevated, but is chronically around 0.3 or so.  Cont to serial, if bumps over 2 or so would be more concerned.  Cardiology dr T[facility] End[facility] called, did not recommend transfer to [facility] for possible cath.    Active Problems:  Stable unless o/w noted:   COPD (chronic obstructive pulmonary disease)   Atrial fibrillation   Chronic systolic CHF (congestive heart failure)   Pacemaker   Depression with anxiety   Hepatitis C   Elevated troponin I level   Chronic anticoagulation  obs on tele.  Full code.  Try to minimize sedatives.  Shelvia Fojtik A 06/29/2014, 1:45 AM

## 2014-06-29 NOTE — Progress Notes (Signed)
PROGRESS NOTE  Mitzi DavenportSandra F Safranek ZOX:[id number] DOB: [birth year] DOA: 06/28/2014 PCP: Dorian HeckleGILLIAM, RYAN D, MD  Summary: 48 year old woman presented complaining of centralized chest pain. Troponin was mildly elevated consistent with previous laboratory values. ED physician question COPD exacerbation.  Assessment/Plan: 1. Chest pain, recurrent with typical and atypical features. Troponins without significant change, mildly elevated, chronic. EKG no acute changes. Very atypical. Cardiology recommendations below. 2. History of coronary artery disease, predominantly nonobstructive, recent Lexiscan with mild area of ischemia versus soft tissue attenuation. 3. Atrial flutter on warfarin. INR therapeutic. Rhythm stable. 4. Chronic diastolic heart failure. Appears at baseline. 5. COPD exacerbation? This was questioned by EDP but I see no evidence of it. 6. 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 Appears to be stable. 7. Diabetes mellitus type 2. Capillary blood sugars stable. 8. Chronic pain. Reportedly out of chronic pain medications. Possible drug seeking behavior. 9. History of hepatitis C   Cardiology has recommended medical therapy, continue aspirin, warfarin, Toprol, Lasix, potassium, PPI. Low-dose Norvasc added as potential antianginal.   Plan continued monitoring, likely discharge 7/25.  Code Status: full code DVT prophylaxis: warfarin Family Communication: none present Disposition Plan: home  Brendia Sacksaniel Alycen Mack, MD  [name]  Pager [phone] If 7PM-7AM, please contact night-coverage at [email or web address], password [email or web address] 06/29/2014, 3:40 PM  LOS: 1 day   Consultants:  Cardiology  Procedures:    Antibiotics:    HPI/Subjective: Complains of continued chest pain.  Objective: Filed Vitals:   06/29/14 0217 06/29/14 0449 06/29/14 1036 06/29/14 1453  BP:  119/80  112/55  Pulse:  74  76  Temp:  97.8 F (36.6 C)  98.5 F (36.9 C)  TempSrc:  Oral  Oral  Resp:  18  18  Height: 6' (1.829  m)     Weight:      SpO2:  99% 92% 100%    Intake/Output Summary (Last 24 hours) at 06/29/14 1540 Last data filed at 06/29/14 1300  Gross per 24 hour  Intake   1370 ml  Output      0 ml  Net   1370 ml     Filed Weights   06/28/14 2234  Weight: 104.327 kg (230 lb)    Exam:     Afebrile, vital signs stable. No hypoxia. Gen. Appears calm and comfortable. Nontoxic.  Psych. Alert. Speech fluent and clear.  Cardiovascular. Regular rate and rhythm. No murmur, rub or gallop. No lower extremity edema. Telemetry atrial flutter, paced rhythm.  Respiratory. Clear to auscultation bilaterally. No wheezes, rales or rhonchi. Normal respiratory effort.  Data Reviewed:  Chemistry: Troponin is mildly elevated without significant change. INR therapeutic on admission, 2.14.  Heme: CBC unremarkable  Imaging: Chest x-ray unremarkable.  Scheduled Meds: . amLODipine  2.5 mg Oral Daily  . aspirin EC  325 mg Oral Daily  . budesonide-formoterol  2 puff Inhalation BID  . furosemide  80 mg Oral BID  . insulin aspart  0-9 Units Subcutaneous TID WC  . metoprolol succinate  25 mg Oral Daily  . pantoprazole  40 mg Oral Daily  . potassium chloride SA  20 mEq Oral Daily  . QUEtiapine  50 mg Oral BID  . tiotropium  18 mcg Inhalation Daily  . warfarin  5 mg Oral QPC supper  . Warfarin - Physician Dosing Inpatient   Does not apply q1800   Continuous Infusions:   Principal Problem:   Acute chest pain Active Problems:   COPD (chronic obstructive pulmonary disease)   Atrial fibrillation   Chronic  systolic CHF (congestive heart failure)   Pacemaker   Depression with anxiety   Hepatitis C   Elevated troponin I level   Chronic anticoagulation   Time spent 20 minutes

## 2014-06-29 NOTE — Consult Note (Addendum)
Primary cardiologist: Dr. Dina RichJonathan Branch Consulting cardiologist: Dr. Jonelle SidleSamuel G. McDowell  Clinical Summary Kim [name] is a medically complex 48 y.o.female just recently seen by our practice in consultation earlier this month due to chronic, recurring chest pain and mildly abnormal troponin I levels. His history is complicated by chronic pain and concerns about drug seeking behavior. She does have a history of CAD, largely nonobstructive in the major epicardials with 60-70% diagonal stenosis based on cardiac catheterization from 2013. She just underwent a Lexiscan Cardiolite on July 9 which revealed a mild area of ischemia in the apical anterior and mid anterior wall versus variable soft tissue attenuation, LVEF 52%. Medical therapy was recommended.  She presents with a prolonged episode of chest pain, nonexertional, some associated nausea and emesis. She also tells me that there is a pleuritic component to her chest pain, and that she also feels a "knot" when she swallows food. She states that she has been out of "one" of her heart medications.  ECG shows no acute ST segment abnormalities, IVCD with underlying atrial flutter as before. Mild troponin I abnormalities are in similar range to those documented earlier in the month, as well as back in June.  Followup chest x-ray shows no acute cardiopulmonary process.   Allergies  Allergen Reactions  . Nitroglycerin Other (See Comments)    CAUSED NUMBNESS ALL OVER  . Doxycycline Itching  . Flexeril [Cyclobenzaprine] Other (See Comments)    Sweating, Lightheaded   . Ibuprofen Other (See Comments)    Increase BP, dizziness, lightheaded  . Tramadol Itching  . Tylenol [Acetaminophen] Other (See Comments)    Pt has Hep C  . Vesicare [Solifenacin] Other (See Comments)    Makes my sweat turn yellow  . Amoxil [Amoxicillin] Nausea Only    Medications Scheduled Medications: . aspirin EC  325 mg Oral Daily  . budesonide-formoterol  2 puff  Inhalation BID  . furosemide  80 mg Oral BID  . metoprolol succinate  25 mg Oral Daily  . pantoprazole  40 mg Oral Daily  . potassium chloride SA  20 mEq Oral Daily  . QUEtiapine  50 mg Oral BID  . tiotropium  18 mcg Inhalation Daily  . warfarin  5 mg Oral QPC supper  . Warfarin - Physician Dosing Inpatient   Does not apply q1800    PRN Medications: albuterol, gi cocktail, metoCLOPramide, ondansetron (ZOFRAN) IV   Past Medical History  Diagnosis Date  . COPD (chronic obstructive pulmonary disease)     Home O2 3L  . Coronary atherosclerosis of native coronary artery     Cardiac catheterization 07/2012 - LAD 40%; small D2 with 60 to 70% ostial; OM1 30 to 40%; RCA 20%; PL1 40%   . Hypothyroidism   . Hepatitis C   . Pneumonia   . Cirrhosis   . Type 2 diabetes mellitus   . GERD (gastroesophageal reflux disease)   . Headache(784.0)   . Anxiety   . Atrial fibrillation and flutter   . Chronic pain   . PE (pulmonary embolism)   . Chronic anticoagulation   . History of medication noncompliance   . SSS (sick sinus syndrome)     Biotronik pacemaker  . Cardiomyopathy     LVEF 40-45%    Past Surgical History  Procedure Laterality Date  . Cholecystectomy    . Pacemaker insertion  02/2012    Biotronik PPM   . Cardiac electrophysiology study and ablation    . Esophagogastroduodenoscopy  Jan  2010    Dr. Arlyce Dice: multiple gastric erosions, benign path  . Esophagogastroduodenoscopy N/A 01/10/2014    ZOX:[id number] EG junction. Query short segment Barrett's esophagus-status post pinch biopsy as described above. Hiatal hernia; otherwise negative EGD    Family History  Problem Relation Age of Onset  . Coronary artery disease Father 68  . Coronary artery disease Mother 70  . Coronary artery disease Brother   . Colon cancer Neg Hx     Social History Ms. Kim [name] reports that she has been smoking Cigarettes.  She has a 15 pack-year smoking history. Her smokeless tobacco use includes  Snuff. Ms. Kim [name] reports that she does not drink alcohol.  Review of Systems No palpitations or syncope. Appetite is stable. Chronic shortness of breath with intermittent cough. No fevers or chills. Other systems reviewed and negative except as outlined.  Physical Examination Blood pressure 119/80, pulse 74, temperature 97.8 F (36.6 C), temperature source Oral, resp. rate 18, height 6' (1.829 m), weight 230 lb (104.327 kg), last menstrual period 02/08/2007, SpO2 99.00%.  Intake/Output Summary (Last 24 hours) at 06/29/14 0821 Last data filed at 06/29/14 0311  Gross per 24 hour  Intake    240 ml  Output      0 ml  Net    240 ml   Telemetry: Atrial flutter.  Chronically ill-appearing obese woman, no distress. HEENT: Conjunctiva and lids normal, oropharynx clear with poor dentition. Neck: Supple, no elevated JVP or carotid bruits, no thyromegaly. Lungs: Decreased breath sounds with prolonged expiratory phase, nonlabored breathing at rest. Cardiac: Largely regular, no S3, soft systolic murmur, no pericardial rub. Abdomen: Soft, nontender, obese, bowel sounds present, no guarding or rebound. Extremities: Mild chronic appearing edema, distal pulses 2+. Skin: Warm and dry. Musculoskeletal: No kyphosis. Neuropsychiatric: Alert and oriented x3, affect grossly appropriate.    Lab Results  Basic Metabolic Panel:  Recent Labs Lab 06/24/14 1704 06/28/14 2310 06/29/14 0149  NA 136* 141  --   K 3.9 3.5*  --   CL 96 100  --   CO2 29 32  --   GLUCOSE 83 109*  --   BUN 7 8  --   CREATININE 0.72 0.89  --   CALCIUM 8.6 8.0*  --   MG  --   --  1.6    CBC:  Recent Labs Lab 06/24/14 1704 06/28/14 2310  WBC 11.9* 7.2  NEUTROABS 8.0* 4.6  HGB 14.2 13.4  HCT 41.0 38.7  MCV 101.2* 101.3*  PLT 183 215    Cardiac Enzymes:  Recent Labs Lab 06/28/14 2310 06/29/14 0149  TROPONINI 0.39* 0.42*    Imaging: EXAM:  MYOCARDIAL IMAGING WITH SPECT (REST AND PHARMACOLOGIC-STRESS)   GATED LEFT VENTRICULAR WALL MOTION STUDY  LEFT VENTRICULAR EJECTION FRACTION  TECHNIQUE:  Standard myocardial SPECT imaging was performed after resting  intravenous injection of 10 mCi Tc-71m sestamibi. Subsequently,  intravenous infusion of Lexiscan was performed under the supervision  of the Cardiology staff. At peak effect of the drug, 30 mCi Tc-55m  sestamibi was injected intravenously and standard myocardial SPECT  imaging was performed. Quantitative gated imaging was also performed  to evaluate left ventricular wall motion, and estimate left  ventricular ejection fraction.  COMPARISON: None.  FINDINGS:  The patient was stressed according to the Lexiscan protocol. The  heart rate ranged from 73 up to 85 beats per min. The blood pressure  range from 120-125/76-85. No chest pain was reported.  Baseline tracings show a ventricular paced rhythm with  no  significant changes with stress. There appears to be underlying  atrial flutter.  Analysis of the raw data shows no significant extracardiac  radiotracer uptake. Analysis of the perfusion images demonstrates a  small area of mildly decreased uptake in the apical anteroseptal and  mid anterior walls. There appears to be complete reversibility when  compared to the resting images. Regional wall motion is essentially  normal with the exception of abnormal septal motion which is due to  ventricular pacing. Therefore, the aforementioned defect may  represent a mild degree of apical and anterior wall ischemia versus  variable soft tissue attenuation artifact. Left ventricular systolic  function is normal, calculated LVEF 52%.  IMPRESSION:  1. Low risk Lexiscan Cardiolite stress test.  2. Mildly reversible defect in the apical anterior and mid anterior  walls, which may represent a mild degree of ischemia vs variable  soft tissue attenuation artifact.  3. Normal left ventricular systolic function, calculated LVEF 52%.  4. No chest pain  was reported.   Echocardiogram 05/17/2014: Study Conclusions  - Left ventricle: The cavity size was normal. There was mild focal basal hypertrophy of the septum. Systolic function was normal. The estimated ejection fraction was in the range of 55% to 60%. Features are consistent with a pseudonormal left ventricular filling pattern, with concomitant abnormal relaxation and increased filling pressure (grade 2 diastolic dysfunction). Doppler parameters are consistent with elevated mean left atrial filling pressure. - Ventricular septum: Septal motion showed abnormal function and dyssynergy. - Mitral valve: Calcified annulus. There was mild regurgitation. - Left atrium: The atrium was moderately dilated. - Right ventricle: Pacer wire or catheter noted in right ventricle. Systolic function was mildly reduced. - Right atrium: Central venous pressure (est): 8 mm Hg. - Tricuspid valve: There was mild regurgitation. - Pulmonary arteries: Systolic pressure could not be accurately estimated. - Pericardium, extracardiac: A prominent pericardial fat pad was present. A trivial pericardial effusion was identified.  Impressions:  - Mild basal septal hypertrophy with LVEF 55-60%. Paradoxical septal motion as well as mild apical hypokinesis with paradoxical motion possibly related to pacing. Grade 1 diastolic dysfunction with increased filling pressures. Moderate left atrial enlargement. MAC with mild mitral regurgitation. Device wire noted in right heart. Mildly reduced RV contraction. Unable to assess PASP. Prominent pericardial fat pad as well as trivial pericardial effusion noted.   Impression  1. Recurrent chest pain with typical and atypical features, also a sense of dysphasia and pleuritic thoracic discomfort. Troponin I levels are mildly abnormal in a flat pattern compared to assessment over the last few months. ECG shows no acute abnormalities her baseline.  2. History of previously  documented nonobstructive CAD with the exception of a 60-70% diagonal branch at cardiac catheterization in 2013. Recent followup Cardiolite as detailed above was mildly abnormal but overall low risk. LVEF normal.  3. History of chronic pain and potential drug-seeking behavior.  4. Atrial flutter, on Coumadin with questionable compliance. INR 1.7 up to 2.1.  5. COPD on home oxygen.  6. History of sick sinus syndrome status post Biotronik pacemaker.  7. Type 2 diabetes mellitus.   Recommendations  Recommend medical therapy titration at this time. Currently on aspirin, Coumadin, Toprol-XL, Lasix with potassium supplement. Would continue PPI. Considered adding Imdur, however chart indicates intolerance to nitroglycerin with "numbness all over." Doubt that this is a true allergy. Will try low-dose Norvasc as potential antianginal.   Jonelle Sidle, M.D., F.A.C.C.

## 2014-06-30 DIAGNOSIS — J438 Other emphysema: Secondary | ICD-10-CM

## 2014-06-30 DIAGNOSIS — I4891 Unspecified atrial fibrillation: Secondary | ICD-10-CM

## 2014-06-30 DIAGNOSIS — R072 Precordial pain: Secondary | ICD-10-CM

## 2014-06-30 LAB — GLUCOSE, CAPILLARY
GLUCOSE-CAPILLARY: 113 mg/dL — AB (ref 70–99)
Glucose-Capillary: 149 mg/dL — ABNORMAL HIGH (ref 70–99)
Glucose-Capillary: 169 mg/dL — ABNORMAL HIGH (ref 70–99)
Glucose-Capillary: 192 mg/dL — ABNORMAL HIGH (ref 70–99)

## 2014-06-30 LAB — BASIC METABOLIC PANEL
Anion gap: 11 (ref 5–15)
BUN: 15 mg/dL (ref 6–23)
CALCIUM: 8.3 mg/dL — AB (ref 8.4–10.5)
CO2: 34 mEq/L — ABNORMAL HIGH (ref 19–32)
CREATININE: 0.86 mg/dL (ref 0.50–1.10)
Chloride: 96 mEq/L (ref 96–112)
GFR calc non Af Amer: 78 mL/min — ABNORMAL LOW (ref >90)
Glucose, Bld: 152 mg/dL — ABNORMAL HIGH (ref 70–99)
Potassium: 3.3 mEq/L — ABNORMAL LOW (ref 3.7–5.3)
Sodium: 141 mEq/L (ref 137–147)

## 2014-06-30 LAB — PROTIME-INR
INR: 1.95 — AB (ref 0.00–1.49)
PROTHROMBIN TIME: 22.2 s — AB (ref 11.6–15.2)

## 2014-06-30 LAB — MAGNESIUM: Magnesium: 1.5 mg/dL (ref 1.5–2.5)

## 2014-06-30 MED ORDER — POTASSIUM CHLORIDE CRYS ER 20 MEQ PO TBCR
40.0000 meq | EXTENDED_RELEASE_TABLET | Freq: Once | ORAL | Status: AC
  Administered 2014-06-30: 40 meq via ORAL
  Filled 2014-06-30: qty 2

## 2014-06-30 MED ORDER — MAGNESIUM OXIDE 400 (241.3 MG) MG PO TABS
400.0000 mg | ORAL_TABLET | Freq: Two times a day (BID) | ORAL | Status: DC
  Administered 2014-06-30 – 2014-07-01 (×2): 400 mg via ORAL
  Filled 2014-06-30 (×2): qty 1

## 2014-06-30 NOTE — Progress Notes (Signed)
PROGRESS NOTE  Kim DavenportSandra F [name] ZOX:[id number] DOB: 1966-05-13 DOA: 06/28/2014 PCP: Kim [name], Kim D, MD  Summary: 48 year old woman presented complaining of centralized chest pain. Troponin was mildly elevated consistent with previous laboratory values. ED physician question COPD exacerbation.  Assessment/Plan: 1. Chest pain, recurrent with typical and atypical features. Troponins without significant change, mildly elevated, chronic. EKG no acute changes. Very atypical.  2. History of coronary artery disease, predominantly nonobstructive, recent Lexiscan with mild area of ischemia versus soft tissue attenuation. 3. Atrial flutter on warfarin. INR therapeutic. Paced rhythm. 4. Chronic diastolic heart failure. Appears well compensated. 5. 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 stable. No evidence of COPD exacerbation. 6. Diabetes mellitus type 2. Blood sugars stable. No hypoglycemia. 7. Chronic pain. Reportedly out of chronic pain medications. Possible drug seeking behavior. 8. History of hepatitis C   Continue medical therapy: aspirin, warfarin, Toprol, Lasix, potassium, PPI. Low-dose Norvasc added as potential antianginal.   Check BMP, magnesium. Monitor rhythm. Next line likely in the next 24 hours.  Code Status: full code DVT prophylaxis: warfarin Family Communication: none present Disposition Plan: home  Kim Sacksaniel Goodrich, MD  [name]  Pager [phone] If 7PM-7AM, please contact night-coverage at [email or web address], password [email or web address] 06/30/2014, 10:16 AM  LOS: 2 days   Consultants:  Cardiology  Procedures:    Antibiotics:    HPI/Subjective: She reports some chest pain.  Objective: Filed Vitals:   06/29/14 1927 06/30/14 0443 06/30/14 0753 06/30/14 0916  BP: 107/55 102/57  121/73  Pulse: 75 74  67  Temp: 97.7 F (36.5 C) 97.6 F (36.4 C)    TempSrc: Oral Oral    Resp: 18 18    Height:      Weight:      SpO2: 100% 98% 95%     Intake/Output Summary (Last 24 hours) at  06/30/14 1016 Last data filed at 06/30/14 0930  Gross per 24 hour  Intake   1200 ml  Output      0 ml  Net   1200 ml     Filed Weights   06/28/14 2234  Weight: 104.327 kg (230 lb)    Exam:     Afebrile, vital signs stable. No hypoxia. Gen. Appears calm and comfortable, resting. Psych. Sleeping. Alert easily, follows simple commands. Speech fluent and clear. Cardiovascular. Regular rate and rhythm. No murmur, rub or gallop. Telemetry with 2 episodes of nonsustained ventricular tachycardia. Respiratory. Clear to auscultation bilaterally. No wheezes, rales or rhonchi. Normal respiratory effort.  Data Reviewed: Heme: INR 1.95  Scheduled Meds: . amLODipine  2.5 mg Oral Daily  . aspirin EC  325 mg Oral Daily  . atorvastatin  20 mg Oral q1800  . budesonide-formoterol  2 puff Inhalation BID  . furosemide  80 mg Oral BID  . insulin aspart  0-9 Units Subcutaneous TID WC  . metoprolol succinate  25 mg Oral Daily  . pantoprazole  40 mg Oral Daily  . potassium chloride SA  20 mEq Oral Daily  . QUEtiapine  50 mg Oral BID  . tiotropium  18 mcg Inhalation Daily  . warfarin  5 mg Oral QPC supper  . Warfarin - Physician Dosing Inpatient   Does not apply q1800   Continuous Infusions:   Principal Problem:   Acute chest pain Active Problems:   COPD (chronic obstructive pulmonary disease)   Atrial fibrillation   Chronic systolic CHF (congestive heart failure)   Pacemaker   Depression with anxiety   Hepatitis C   Elevated troponin I level  Chronic anticoagulation    Time 15 minutes.

## 2014-07-01 DIAGNOSIS — J449 Chronic obstructive pulmonary disease, unspecified: Secondary | ICD-10-CM

## 2014-07-01 LAB — GLUCOSE, CAPILLARY
Glucose-Capillary: 186 mg/dL — ABNORMAL HIGH (ref 70–99)
Glucose-Capillary: 97 mg/dL (ref 70–99)

## 2014-07-01 LAB — PROTIME-INR
INR: 1.45 (ref 0.00–1.49)
PROTHROMBIN TIME: 17.6 s — AB (ref 11.6–15.2)

## 2014-07-01 MED ORDER — AMLODIPINE BESYLATE 2.5 MG PO TABS: 2.5000 mg | ORAL_TABLET | Freq: Every day | ORAL | Status: AC

## 2014-07-01 MED ORDER — ASPIRIN EC 81 MG PO TBEC: 81.0000 mg | DELAYED_RELEASE_TABLET | Freq: Every day | ORAL | Status: DC

## 2014-07-01 NOTE — Discharge Summary (Signed)
Physician Discharge Summary  Kim [name] WUJ:[id number] DOB: [birth year] DOA: 06/28/2014  PCP: Kim Heckle, MD  Admit date: 06/28/2014 Discharge date: 07/01/2014  Recommendations for Outpatient Follow-up:  1. Noncardiac chest pain. Consider outpatient evaluation as clinically indicated. 2. Continue warfarin for atrial flutter. 3. Consider outpatient followup for chronic pain. 4. Consider referral for outpatient ophthalmology exam    Follow-up Information   Follow up with Kim Heckle, MD. Schedule an appointment as soon as possible for a visit in 1 week.   Specialty:  Family Medicine   Contact information:   [address] [place] [address] [phone]      Discharge Diagnoses:  1. Atypical chest pain 2. Atrial flutter on warfarin 3. History of coronary artery disease 4. Chronic diastolic congestive heart failure 5. Oxygen-dependent COPD 6. Diabetes mellitus type 2  Discharge Condition: Improved Disposition: Home  Diet recommendation: Heart healthy diabetic diet  Filed Weights   06/28/14 2234  Weight: 104.327 kg (230 lb)    History of present illness:  48 year old woman presented complaining of centralized chest pain. Troponin was mildly elevated consistent with previous laboratory values. ED physician questioned COPD exacerbation.  Hospital Course:  Kim [name] was admitted for further evaluation of chest pain with mildly elevated troponins. Troponins remained flat, mildly elevated. Seen by cardiology in consultation, recommended adding Norvasc, continue medical management. The pain resolved. Other issues remain stable as below.   1. Chest pain, recurrent with typical and atypical features. Troponins without significant change, mildly elevated, chronic. EKG no acute changes. Very atypical. Cardiology evaluated, add Norvasc, no further suggestions. 2. History of coronary artery disease, predominantly nonobstructive, recent Lexiscan with mild area of ischemia  versus soft tissue attenuation. 3. Atrial flutter on warfarin. Paced rhythm. 4. Chronic diastolic heart failure. Compensated. 5. 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 stable. No evidence of COPD exacerbation. 6. Diabetes mellitus type 2. Stable. No hypoglycemia. 7. Chronic pain. Reportedly out of chronic pain medications. Possible drug seeking behavior. Stable. Has not required any pain medication. 8. History of hepatitis C 9. Reports decreased vision right eye for several months. Appears to have cataract on examination. Pupils round and reactive, equal to the left. Suggest outpatient followup. Continue medical therapy: aspirin, warfarin, Toprol, Lasix, potassium, PPI. Low-dose Norvasc added as potential antianginal.   Consultants:  Cardiology Procedures:  none Discharge Instructions  Discharge Instructions   Activity as tolerated - No restrictions    Complete by:  As directed      Diet - low sodium heart healthy    Complete by:  As directed      Discharge instructions    Complete by:  As directed   Call your physician or seek immediate medical attention for worsening pain, shortness of breath or worsening of condition. Take one extra dose of warfarin this evening 7/26, then resume usual dosing each day. You should follow-up with an eye doctor in the near future.            Medication List    STOP taking these medications       cephALEXin 500 MG capsule  Commonly known as:  KEFLEX     metoCLOPramide 10 MG tablet  Commonly known as:  REGLAN      TAKE these medications       albuterol 108 (90 BASE) MCG/ACT inhaler  Commonly known as:  PROVENTIL HFA;VENTOLIN HFA  Inhale 2 puffs into the lungs every 6 (six) hours as needed for wheezing or shortness of breath.  amLODipine 2.5 MG tablet  Commonly known as:  NORVASC  Take 1 tablet (2.5 mg total) by mouth daily.     aspirin EC 81 MG tablet  Take 1 tablet (81 mg total) by mouth daily.     atorvastatin 20 MG tablet  Commonly  known as:  LIPITOR  Take 20 mg by mouth daily.     budesonide-formoterol 80-4.5 MCG/ACT inhaler  Commonly known as:  SYMBICORT  Inhale 2 puffs into the lungs 2 (two) times daily.     furosemide 80 MG tablet  Commonly known as:  LASIX  Take 1 tablet (80 mg total) by mouth 2 (two) times daily.     metoprolol succinate 25 MG 24 hr tablet  Commonly known as:  TOPROL-XL  Take 1 tablet (25 mg total) by mouth daily.     pantoprazole 40 MG tablet  Commonly known as:  PROTONIX  Take 40 mg by mouth daily.     potassium chloride SA 20 MEQ tablet  Commonly known as:  K-DUR,KLOR-CON  Take 1 tablet (20 mEq total) by mouth daily.     QUEtiapine 25 MG tablet  Commonly known as:  SEROQUEL  Take 50 mg by mouth 2 (two) times daily.     tiotropium 18 MCG inhalation capsule  Commonly known as:  SPIRIVA  Place 18 mcg into inhaler and inhale daily.     warfarin 5 MG tablet  Commonly known as:  COUMADIN  Take 1 tablet (5 mg total) by mouth daily.       Allergies  Allergen Reactions  . Nitroglycerin Other (See Comments)    CAUSED NUMBNESS ALL OVER  . Doxycycline Itching  . Flexeril [Cyclobenzaprine] Other (See Comments)    Sweating, Lightheaded   . Ibuprofen Other (See Comments)    Increase BP, dizziness, lightheaded  . Tramadol Itching  . Tylenol [Acetaminophen] Other (See Comments)    Pt has Hep C  . Vesicare [Solifenacin] Other (See Comments)    Makes my sweat turn yellow  . Amoxil [Amoxicillin] Nausea Only    The results of significant diagnostics from this hospitalization (including imaging, microbiology, ancillary and laboratory) are listed below for reference.    Significant Diagnostic Studies: Dg Chest 2 View  06/28/2014   CLINICAL DATA:  Chest pain and shortness of breath. History of smoking.  EXAM: CHEST  2 VIEW  COMPARISON:  Chest radiograph performed 06/12/2014  FINDINGS: The lungs are well-aerated and clear. There is no evidence of focal opacification, pleural effusion  or pneumothorax.  The heart is borderline normal in size; the mediastinal contour is within normal limits. A pacemaker is noted at the left chest wall, with leads ending at the right atrium and right ventricle. No acute osseous abnormalities are seen.  IMPRESSION: No acute cardiopulmonary process seen.   Electronically Signed   By: Roanna RaiderJeffery  Chang M.D.   On: 06/28/2014 23:22   Dg Chest 2 View  Ct Head Wo Contrast  06/29/2014   CLINICAL DATA:  Right-sided visual loss. Cloudiness at the right pupil.  EXAM: CT HEAD WITHOUT CONTRAST  TECHNIQUE: Contiguous axial images were obtained from the base of the skull through the vertex without intravenous contrast.  COMPARISON:  CT of the head performed 07/21/2013  FINDINGS: There is no evidence of acute infarction, mass lesion, or intra- or extra-axial hemorrhage on CT.  Mild subcortical white matter change likely reflects small vessel ischemic microangiopathy.  The posterior fossa, including the cerebellum, brainstem and fourth ventricle, is within normal limits.  The third and lateral ventricles, and basal ganglia are unremarkable in appearance. The cerebral hemispheres demonstrate grossly normal gray-white differentiation. No mass effect or midline shift is seen.  There is no evidence of fracture; visualized osseous structures are unremarkable in appearance. The visualized portions of the orbits are within normal limits. The paranasal sinuses and mastoid air cells are well-aerated. No significant soft tissue abnormalities are seen.  IMPRESSION: 1. No acute intracranial pathology seen on CT. 2. Mild small vessel ischemic microangiopathy.   Electronically Signed   By: Roanna Raider M.D.   On: 06/29/2014 00:25   Microbiology: Recent Results (from the past 240 hour(s))  URINE CULTURE     Status: None   Collection Time    06/24/14  4:23 PM      Result Value Ref Range Status   Specimen Description URINE, CLEAN CATCH   Final   Special Requests NONE   Final   Culture   Setup Time     Final   Value: 06/25/2014 14:50     Performed at [facility] Count     Final   Value: NO GROWTH     Performed at [facility]   Culture     Final   Value: NO GROWTH     Performed at [facility]   Report Status 06/26/2014 FINAL   Final     Labs: Basic Metabolic Panel:  Recent Labs Lab 06/24/14 1704 06/28/14 2310 06/29/14 0149 06/30/14 1633  NA 136* 141  --  141  K 3.9 3.5*  --  3.3*  CL 96 100  --  96  CO2 29 32  --  34*  GLUCOSE 83 109*  --  152*  BUN 7 8  --  15  CREATININE 0.72 0.89  --  0.86  CALCIUM 8.6 8.0*  --  8.3*  MG  --   --  1.6 1.5   CBC:  Recent Labs Lab 06/24/14 1704 06/28/14 2310  WBC 11.9* 7.2  NEUTROABS 8.0* 4.6  HGB 14.2 13.4  HCT 41.0 38.7  MCV 101.2* 101.3*  PLT 183 215   Cardiac Enzymes:  Recent Labs Lab 06/28/14 2310 06/29/14 0149 06/29/14 0830 06/29/14 1506  TROPONINI 0.39* 0.42* 0.40* 0.39*     Recent Labs  06/07/14 0105 06/12/14 2137 06/28/14 2325  PROBNP 1559.0* 1342.0* 1712.0*   CBG:  Recent Labs Lab 06/30/14 0740 06/30/14 1125 06/30/14 1639 06/30/14 2026 07/01/14 0731  GLUCAP 113* 149* 169* 192* 186*    Principal Problem:   Acute chest pain Active Problems:   COPD (chronic obstructive pulmonary disease)   Atrial fibrillation   Chronic systolic CHF (congestive heart failure)   Pacemaker   Depression with anxiety   Hepatitis C   Elevated troponin I level   Chronic anticoagulation   Time coordinating discharge:  20 minutes  Signed:  Brendia Sacks, MD [name] 07/01/2014, 11:23 AM

## 2014-07-01 NOTE — Progress Notes (Signed)
PROGRESS NOTE  RANDA FELTES SFS:[id number] DOB: 1966-02-04 DOA: 06/28/2014 PCP: Dorian Heckle, MD  Summary: 48 year old woman presented complaining of centralized chest pain. Troponin was mildly elevated consistent with previous laboratory values. ED physician question COPD exacerbation.  Assessment/Plan: 1. Chest pain, recurrent with typical and atypical features. Troponins without significant change, mildly elevated, chronic. EKG no acute changes. Very atypical. Cardiology evaluated, add Norvasc, no further suggestions. 2. History of coronary artery disease, predominantly nonobstructive, recent Lexiscan with mild area of ischemia versus soft tissue attenuation. 3. Atrial flutter on warfarin. Paced rhythm. 4. Chronic diastolic heart failure. Compensated. 5. 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 stable. No evidence of COPD exacerbation. 6. Diabetes mellitus type 2. Stable. No hypoglycemia. 7. Chronic pain. Reportedly out of chronic pain medications. Possible drug seeking behavior. Stable. Has not required any pain medication. 8. History of hepatitis C 9. Reports decreased vision right eye for several months. Appears to have cataract on examination. Pupils round and reactive, equal to the left. Suggest outpatient followup.   Continue medical therapy: aspirin, warfarin, Toprol, Lasix, potassium, PPI. Low-dose Norvasc added as potential antianginal.   Home today.  Brendia Sacks, MD  [name]  Pager [phone] If 7PM-7AM, please contact night-coverage at [email or web address], password [facility] 07/01/2014, 11:15 AM  LOS: 3 days   Consultants:  Cardiology  Procedures:    Antibiotics:    HPI/Subjective: Overall doing ok, discussed with RN. No concerns.  Pain improved.  Objective: Filed Vitals:   06/30/14 1916 06/30/14 2023 07/01/14 0510 07/01/14 0813  BP: 102/59 108/73 115/73   Pulse: 75 70 72   Temp:  98 F (36.7 C) 97.8 F (36.6 C)   TempSrc:  Oral Oral   Resp:  18 18     Height:      Weight:      SpO2: 98% 100% 100% 95%    Intake/Output Summary (Last 24 hours) at 07/01/14 1115 Last data filed at 07/01/14 0906  Gross per 24 hour  Intake   1840 ml  Output      0 ml  Net   1840 ml     Filed Weights   06/28/14 2234  Weight: 104.327 kg (230 lb)    Exam:     Afebrile, VSS. No hypoxia. Gen. Appears calm and comfortable. Psych. Very alert. Speech fluent and clear. Cardiovascular. Regular rate and rhythm. No murmur, rub or gallop. No lower extremity edema. Telemetry paced rhythm. No arrhythmias. Respiratory. Clear to auscultation bilaterally. No wheezes, rales or rhonchi. Normal respiratory effort. Neurologic. Grossly unremarkable  Data Reviewed: Heme: INR 1.45  Scheduled Meds: . amLODipine  2.5 mg Oral Daily  . aspirin EC  325 mg Oral Daily  . atorvastatin  20 mg Oral q1800  . budesonide-formoterol  2 puff Inhalation BID  . furosemide  80 mg Oral BID  . insulin aspart  0-9 Units Subcutaneous TID WC  . magnesium oxide  400 mg Oral BID  . metoprolol succinate  25 mg Oral Daily  . pantoprazole  40 mg Oral Daily  . potassium chloride SA  20 mEq Oral Daily  . QUEtiapine  50 mg Oral BID  . tiotropium  18 mcg Inhalation Daily  . warfarin  5 mg Oral QPC supper  . Warfarin - Physician Dosing Inpatient   Does not apply q1800   Continuous Infusions:   Principal Problem:   Acute chest pain Active Problems:   COPD (chronic obstructive pulmonary disease)   Atrial fibrillation   Chronic systolic CHF (congestive  heart failure)   Pacemaker   Depression with anxiety   Hepatitis C   Elevated troponin I level   Chronic anticoagulation

## 2014-07-01 NOTE — Progress Notes (Signed)
Patient with orders to be discharged home. Discharge instructions given, patient verbalized understanding. Patient stable. Patient left in private vehicle with a friend.

## 2014-07-11 ENCOUNTER — Encounter: Payer: Self-pay | Admitting: *Deleted

## 2014-07-15 ENCOUNTER — Emergency Department (HOSPITAL_COMMUNITY): Payer: Medicaid Other

## 2014-07-15 ENCOUNTER — Inpatient Hospital Stay (HOSPITAL_COMMUNITY)
Admission: EM | Admit: 2014-07-15 | Discharge: 2014-07-17 | DRG: 291 | Disposition: A | Discharge status: Home Health | Payer: Medicaid Other | Attending: Internal Medicine | Admitting: Internal Medicine

## 2014-07-15 ENCOUNTER — Encounter (HOSPITAL_COMMUNITY): Payer: Self-pay | Admitting: Emergency Medicine

## 2014-07-15 DIAGNOSIS — G8929 Other chronic pain: Secondary | ICD-10-CM | POA: Diagnosis present

## 2014-07-15 DIAGNOSIS — B1920 Unspecified viral hepatitis C without hepatic coma: Secondary | ICD-10-CM | POA: Diagnosis present

## 2014-07-15 DIAGNOSIS — I2510 Atherosclerotic heart disease of native coronary artery without angina pectoris: Secondary | ICD-10-CM | POA: Diagnosis present

## 2014-07-15 DIAGNOSIS — Z7901 Long term (current) use of anticoagulants: Secondary | ICD-10-CM

## 2014-07-15 DIAGNOSIS — I4891 Unspecified atrial fibrillation: Secondary | ICD-10-CM | POA: Diagnosis present

## 2014-07-15 DIAGNOSIS — F17200 Nicotine dependence, unspecified, uncomplicated: Secondary | ICD-10-CM | POA: Diagnosis present

## 2014-07-15 DIAGNOSIS — Z8701 Personal history of pneumonia (recurrent): Secondary | ICD-10-CM

## 2014-07-15 DIAGNOSIS — J449 Chronic obstructive pulmonary disease, unspecified: Secondary | ICD-10-CM | POA: Diagnosis present

## 2014-07-15 DIAGNOSIS — J4489 Other specified chronic obstructive pulmonary disease: Secondary | ICD-10-CM | POA: Diagnosis present

## 2014-07-15 DIAGNOSIS — J441 Chronic obstructive pulmonary disease with (acute) exacerbation: Secondary | ICD-10-CM

## 2014-07-15 DIAGNOSIS — I509 Heart failure, unspecified: Secondary | ICD-10-CM | POA: Diagnosis present

## 2014-07-15 DIAGNOSIS — R079 Chest pain, unspecified: Secondary | ICD-10-CM | POA: Diagnosis present

## 2014-07-15 DIAGNOSIS — R778 Other specified abnormalities of plasma proteins: Secondary | ICD-10-CM

## 2014-07-15 DIAGNOSIS — E039 Hypothyroidism, unspecified: Secondary | ICD-10-CM | POA: Diagnosis present

## 2014-07-15 DIAGNOSIS — R197 Diarrhea, unspecified: Secondary | ICD-10-CM | POA: Diagnosis present

## 2014-07-15 DIAGNOSIS — I5043 Acute on chronic combined systolic (congestive) and diastolic (congestive) heart failure: Secondary | ICD-10-CM

## 2014-07-15 DIAGNOSIS — Z86711 Personal history of pulmonary embolism: Secondary | ICD-10-CM

## 2014-07-15 DIAGNOSIS — J189 Pneumonia, unspecified organism: Secondary | ICD-10-CM | POA: Diagnosis present

## 2014-07-15 DIAGNOSIS — I482 Chronic atrial fibrillation, unspecified: Secondary | ICD-10-CM

## 2014-07-15 DIAGNOSIS — Z91199 Patient's noncompliance with other medical treatment and regimen due to unspecified reason: Secondary | ICD-10-CM

## 2014-07-15 DIAGNOSIS — Z950 Presence of cardiac pacemaker: Secondary | ICD-10-CM

## 2014-07-15 DIAGNOSIS — Z720 Tobacco use: Secondary | ICD-10-CM | POA: Diagnosis present

## 2014-07-15 DIAGNOSIS — K219 Gastro-esophageal reflux disease without esophagitis: Secondary | ICD-10-CM | POA: Diagnosis present

## 2014-07-15 DIAGNOSIS — R7989 Other specified abnormal findings of blood chemistry: Secondary | ICD-10-CM

## 2014-07-15 DIAGNOSIS — R799 Abnormal finding of blood chemistry, unspecified: Secondary | ICD-10-CM | POA: Diagnosis present

## 2014-07-15 DIAGNOSIS — K7460 Unspecified cirrhosis of liver: Secondary | ICD-10-CM | POA: Diagnosis present

## 2014-07-15 DIAGNOSIS — I428 Other cardiomyopathies: Secondary | ICD-10-CM | POA: Diagnosis present

## 2014-07-15 DIAGNOSIS — Z8249 Family history of ischemic heart disease and other diseases of the circulatory system: Secondary | ICD-10-CM

## 2014-07-15 DIAGNOSIS — I5033 Acute on chronic diastolic (congestive) heart failure: Principal | ICD-10-CM | POA: Diagnosis present

## 2014-07-15 DIAGNOSIS — Z9981 Dependence on supplemental oxygen: Secondary | ICD-10-CM

## 2014-07-15 DIAGNOSIS — E669 Obesity, unspecified: Secondary | ICD-10-CM | POA: Diagnosis present

## 2014-07-15 DIAGNOSIS — E119 Type 2 diabetes mellitus without complications: Secondary | ICD-10-CM | POA: Diagnosis present

## 2014-07-15 DIAGNOSIS — I4892 Unspecified atrial flutter: Secondary | ICD-10-CM | POA: Diagnosis present

## 2014-07-15 DIAGNOSIS — J9610 Chronic respiratory failure, unspecified whether with hypoxia or hypercapnia: Secondary | ICD-10-CM | POA: Diagnosis present

## 2014-07-15 DIAGNOSIS — J42 Unspecified chronic bronchitis: Secondary | ICD-10-CM

## 2014-07-15 DIAGNOSIS — Z9119 Patient's noncompliance with other medical treatment and regimen: Secondary | ICD-10-CM

## 2014-07-15 LAB — LIPASE, BLOOD: Lipase: 30 U/L (ref 11–59)

## 2014-07-15 LAB — COMPREHENSIVE METABOLIC PANEL
ALT: 14 U/L (ref 0–35)
AST: 32 U/L (ref 0–37)
Albumin: 3.4 g/dL — ABNORMAL LOW (ref 3.5–5.2)
Alkaline Phosphatase: 80 U/L (ref 39–117)
Anion gap: 10 (ref 5–15)
BILIRUBIN TOTAL: 0.8 mg/dL (ref 0.3–1.2)
BUN: 7 mg/dL (ref 6–23)
CO2: 30 mEq/L (ref 19–32)
Calcium: 8.6 mg/dL (ref 8.4–10.5)
Chloride: 100 mEq/L (ref 96–112)
Creatinine, Ser: 0.73 mg/dL (ref 0.50–1.10)
GFR calc Af Amer: >90 mL/min (ref >90)
GFR calc non Af Amer: >90 mL/min (ref >90)
GLUCOSE: 106 mg/dL — AB (ref 70–99)
Potassium: 3.6 mEq/L — ABNORMAL LOW (ref 3.7–5.3)
SODIUM: 140 meq/L (ref 137–147)
TOTAL PROTEIN: 7.2 g/dL (ref 6.0–8.3)

## 2014-07-15 LAB — TROPONIN I: TROPONIN I: 0.53 ng/mL — AB (ref <0.30)

## 2014-07-15 LAB — PRO B NATRIURETIC PEPTIDE: PRO B NATRI PEPTIDE: 2008 pg/mL — AB (ref 0–125)

## 2014-07-15 MED ORDER — ONDANSETRON HCL 4 MG/2ML IJ SOLN
4.0000 mg | Freq: Once | INTRAMUSCULAR | Status: AC
  Administered 2014-07-15: 4 mg via INTRAVENOUS
  Filled 2014-07-15: qty 2

## 2014-07-15 MED ORDER — SODIUM CHLORIDE 0.9 % IV SOLN
INTRAVENOUS | Status: DC
  Administered 2014-07-15: 23:00:00 via INTRAVENOUS
  Administered 2014-07-16: 1 mL/h via INTRAVENOUS

## 2014-07-15 MED ORDER — HYDROMORPHONE HCL PF 1 MG/ML IJ SOLN
1.0000 mg | Freq: Once | INTRAMUSCULAR | Status: AC
  Administered 2014-07-15: 1 mg via INTRAVENOUS
  Filled 2014-07-15: qty 1

## 2014-07-15 NOTE — ED Notes (Signed)
Pt states chest pain started 3 days ago, along with vomiting and diarrhea. Has not had bp meds or home O2.

## 2014-07-15 NOTE — ED Provider Notes (Addendum)
CSN: [id number]     Arrival date & time 07/15/14  2057 History  This chart was scribed for Kim MuldersScott Reace Breshears, MD by Elon SpannerGarrett Cook, ED Scribe. This patient was seen in room APA19/APA19 and the patient's care was started at 9:36 PM.    Chief Complaint  Patient presents with  . Chest Pain    Patient is a 48 y.o. female presenting with chest pain. The history is provided by the patient. No language interpreter was used.  Chest Pain Associated symptoms: abdominal pain, back pain and shortness of breath   Associated symptoms: no cough, no fever and no headache     HPI Comments: Kim [name] is a 48 y.o. female with an extensive medical Hx noted below, significant for of A-Fib and flutter, COPD, Hepatitis-C, DM, and implanted pacemaker who presents to the Emergency Department complaining of 3 days of constant substernal chest pain.  She states that she feels like her heart is going to "beat out of her chest".  She rates the pain currently at 10/10 and describes it as sharp.  She also reports associated palpitations but has a normal rate currently.  Patient also reports associated moderate generalized abdominal pain rated an 8-9/10 onset three days ago.  She reports associated nausea and 2 episodes of emesis with questionable blood contents. She uses 2L of 02 at home and her 02 saturation currently is 99%.  She also reports chills, back pain.  Patient denies fever.    Pt reports she is prescribed and is compliant with Coumadin.   PCP: Gwinda PasseGilliam  Current PCP: None Past Medical History  Diagnosis Date  . COPD (chronic obstructive pulmonary disease)     Home O2 3L  . Coronary atherosclerosis of native coronary artery     Cardiac catheterization 07/2012 - LAD 40%; small D2 with 60 to 70% ostial; OM1 30 to 40%; RCA 20%; PL1 40%   . Hypothyroidism   . Hepatitis C   . Pneumonia   . Cirrhosis   . Type 2 diabetes mellitus   . GERD (gastroesophageal reflux disease)   . Headache(784.0)   . Anxiety   .  Atrial fibrillation and flutter   . Chronic pain   . PE (pulmonary embolism)   . Chronic anticoagulation   . History of medication noncompliance   . SSS (sick sinus syndrome)     Biotronik pacemaker  . Cardiomyopathy     LVEF 40-45%   Past Surgical History  Procedure Laterality Date  . Cholecystectomy    . Pacemaker insertion  02/2012    Biotronik PPM   . Cardiac electrophysiology study and ablation    . Esophagogastroduodenoscopy  Jan 2010    Dr. Arlyce DiceKaplan: multiple gastric erosions, benign path  . Esophagogastroduodenoscopy N/A 01/10/2014    WUJ:[id number]:Patulous EG junction. Query short segment Barrett's esophagus-status post pinch biopsy as described above. Hiatal hernia; otherwise negative EGD   Family History  Problem Relation Age of Onset  . Coronary artery disease Father 3863  . Coronary artery disease Mother [id number]  . Coronary artery disease Brother   . Colon cancer Neg Hx    History  Substance Use Topics  . Smoking status: Current Every Day Smoker -- 0.50 packs/day for 30 years    Types: Cigarettes  . Smokeless tobacco: Current User    Types: Snuff  . Alcohol Use: No   OB History   Grav Para Term Preterm Abortions TAB SAB Ect Mult Living  Review of Systems  Constitutional: Positive for chills. Negative for fever.  HENT: Negative for rhinorrhea and sore throat.   Eyes: Negative for visual disturbance.  Respiratory: Positive for shortness of breath. Negative for cough.   Cardiovascular: Positive for chest pain. Negative for leg swelling.  Gastrointestinal: Positive for abdominal pain.  Genitourinary: Negative for dysuria.  Musculoskeletal: Positive for back pain. Negative for neck pain.  Skin: Negative for rash.  Neurological: Negative for headaches.  Hematological: Bruises/bleeds easily.  Psychiatric/Behavioral: Negative for confusion.      Allergies  Nitroglycerin; Doxycycline; Flexeril; Ibuprofen; Tramadol; Tylenol; Vesicare; and Amoxil  Home  Medications   Prior to Admission medications   Medication Sig Start Date End Date Taking? Authorizing Provider  amLODipine (NORVASC) 2.5 MG tablet Take 1 tablet (2.5 mg total) by mouth daily. 07/01/14  Yes Standley Brooking, MD  aspirin EC 81 MG tablet Take 1 tablet (81 mg total) by mouth daily. 07/01/14  Yes Standley Brooking, MD  atorvastatin (LIPITOR) 20 MG tablet Take 20 mg by mouth daily.   Yes Historical Provider, MD  budesonide-formoterol (SYMBICORT) 80-4.5 MCG/ACT inhaler Inhale 2 puffs into the lungs 2 (two) times daily.   Yes Historical Provider, MD  furosemide (LASIX) 80 MG tablet Take 1 tablet (80 mg total) by mouth 2 (two) times daily. 06/14/14  Yes Standley Brooking, MD  metoprolol succinate (TOPROL-XL) 25 MG 24 hr tablet Take 1 tablet (25 mg total) by mouth daily. 06/14/14  Yes Lesle Chris Black, NP  pantoprazole (PROTONIX) 40 MG tablet Take 40 mg by mouth daily.   Yes Historical Provider, MD  potassium chloride SA (K-DUR,KLOR-CON) 20 MEQ tablet Take 1 tablet (20 mEq total) by mouth daily. 06/07/14  Yes Lyanne Co, MD  QUEtiapine (SEROQUEL) 25 MG tablet Take 50 mg by mouth 2 (two) times daily. 12/08/13  Yes Historical Provider, MD  tiotropium (SPIRIVA) 18 MCG inhalation capsule Place 18 mcg into inhaler and inhale daily.   Yes Historical Provider, MD  warfarin (COUMADIN) 4 MG tablet Take 4 mg by mouth daily.   Yes Historical Provider, MD  albuterol (PROVENTIL HFA;VENTOLIN HFA) 108 (90 BASE) MCG/ACT inhaler Inhale 2 puffs into the lungs every 6 (six) hours as needed for wheezing or shortness of breath.     Historical Provider, MD   BP 151/100  Pulse 79  Temp(Src) 98.1 F (36.7 C) (Oral)  Resp 23  Ht 5\' 10"  (1.778 m)  Wt 210 lb (95.255 kg)  BMI 30.13 kg/m2  SpO2 97%  LMP 02/08/2007 Physical Exam  Nursing note and vitals reviewed. Constitutional: She is oriented to person, place, and time. She appears well-developed and well-nourished. No distress.  HENT:  Head: Normocephalic and  atraumatic.  Eyes: Conjunctivae and EOM are normal.  Neck: Neck supple. No tracheal deviation present.  Cardiovascular: Normal rate and regular rhythm.   Pulmonary/Chest: Effort normal and breath sounds normal. No respiratory distress. She has no wheezes. She has no rales.  Abdominal: There is no tenderness.  Musculoskeletal: Normal range of motion. She exhibits no edema.  Neurological: She is alert and oriented to person, place, and time. No cranial nerve deficit. She exhibits normal muscle tone. Coordination normal.  Skin: Skin is warm and dry.  Psychiatric: She has a normal mood and affect. Her behavior is normal.    ED Course  Procedures (including critical care time)  DIAGNOSTIC STUDIES: Oxygen Saturation is 98% on RA, normal by my interpretation.    COORDINATION OF CARE:  10:00 PM  Discussed plan to IV fluids and obtain labs.  Patient acknowledges and agrees with plan.    Labs Review Labs Reviewed  COMPREHENSIVE METABOLIC PANEL - Abnormal; Notable for the following:    Potassium 3.6 (*)    Glucose, Bld 106 (*)    Albumin 3.4 (*)    All other components within normal limits  TROPONIN I - Abnormal; Notable for the following:    Troponin I 0.53 (*)    All other components within normal limits  PRO B NATRIURETIC PEPTIDE - Abnormal; Notable for the following:    Pro B Natriuretic peptide (BNP) 2008.0 (*)    All other components within normal limits  LIPASE, BLOOD  URINALYSIS, ROUTINE W REFLEX MICROSCOPIC  PROTIME-INR   Results for orders placed during the hospital encounter of 07/15/14  COMPREHENSIVE METABOLIC PANEL      Result Value Ref Range   Sodium 140  137 - 147 mEq/L   Potassium 3.6 (*) 3.7 - 5.3 mEq/L   Chloride 100  96 - 112 mEq/L   CO2 30  19 - 32 mEq/L   Glucose, Bld 106 (*) 70 - 99 mg/dL   BUN 7  6 - 23 mg/dL   Creatinine, Ser 1.61  0.50 - 1.10 mg/dL   Calcium 8.6  8.4 - 09.6 mg/dL   Total Protein 7.2  6.0 - 8.3 g/dL   Albumin 3.4 (*) 3.5 - 5.2 g/dL    AST 32  0 - 37 U/L   ALT 14  0 - 35 U/L   Alkaline Phosphatase 80  39 - 117 U/L   Total Bilirubin 0.8  0.3 - 1.2 mg/dL   GFR calc non Af Amer >90  >90 mL/min   GFR calc Af Amer >90  >90 mL/min   Anion gap 10  5 - 15  LIPASE, BLOOD      Result Value Ref Range   Lipase 30  11 - 59 U/L  TROPONIN I      Result Value Ref Range   Troponin I 0.53 (*) <0.30 ng/mL  PRO B NATRIURETIC PEPTIDE      Result Value Ref Range   Pro B Natriuretic peptide (BNP) 2008.0 (*) 0 - 125 pg/mL     Imaging Review Dg Chest 2 View  07/15/2014   CLINICAL DATA:  Shortness of breath, cough and weakness.  EXAM: CHEST  2 VIEW  COMPARISON:  Chest radiograph performed 06/28/2014  FINDINGS: There is a mildly loculated small right pleural effusion, with right basilar airspace opacification. This likely reflects pneumonia. The left lung appears relatively clear. No pneumothorax is seen.  The heart is enlarged. A pacemaker is seen at the left chest wall, with leads ending at the right atrium and right ventricle. No acute osseous abnormalities are identified.  IMPRESSION: 1. Mildly loculated small right pleural effusion, with right basilar airspace opacification. This likely reflects pneumonia. Would complete treatment for pneumonia, then perform follow-up chest radiograph to ensure resolution of airspace opacities 2. Cardiomegaly.   Electronically Signed   By: Roanna Raider M.D.   On: 07/15/2014 22:31     EKG Interpretation   Date/Time:  Sunday July 15 2014 21:12:32 EDT Ventricular Rate:  84 PR Interval:    QRS Duration: 174 QT Interval:  476 QTC Calculation: 563 R Axis:   -82 Text Interpretation:  Atrial flutter Ventricular premature complex IVCD,  consider atypical RBBB LVH with IVCD, LAD and secondary repol abnrm  Prolonged QT interval No significant change since last tracing  Confirmed  by Deretha Emory  MD, Cammie Faulstich [phone]) on 07/15/2014 11:59:40 PM      MDM   Final diagnoses:  Chest pain, unspecified chest pain  type  HCAP (healthcare-associated pneumonia)   Patient presents with constant chest pain for 3 days. If troponins are negative this is not acute cardiac event. Chest x-ray however does raise concerns for pneumonia. Patient was admitted the end of July for a chest pain rule out. Therefore this would technically be a healthcare acquired pneumonia. However she treated as an outpatient Levaquin would be appropriate for treatment. Patient    not in any significant respiratory distress. Oxygen saturation is on her normal amount 2 L is 100%.  In addition patient was complaining of vomiting and diarrhea. His only been a couple episodes a day. Patient is on Coumadin INR will be checked.  Patient with mildly elevated troponin. Reviewed the records from her past admission the end of July patient had mildly elevated troponins throughout that admission. Was seen by cardiology. Felt not to be an acute cardiac event. Now we have a dilemma. Today's EKG without any changes compared to the previous EKGs. We'll discuss with the hospitalist in regard to appropriate therapy. Whether admission is required or not. Patient's concern for pneumonia could probably be treated as an outpatient. She is in no acute distress.  I personally performed the services described in this documentation, which was scribed in my presence. The recorded information has been reviewed and is accurate.     Kim Mulders, MD 07/16/14 0000   Addendum:  Discussed with hospitalist here as well as cardiology fellow. Cardiology fellow felt patient could stay here. Hospitalist will admit. Patient has had chronically elevated troponins. All parties feel that this is unlikely to be a non-STEMI. Cardiology is not recommending heparin at this point in time. Patient also has a history of pneumonia that would be a healthcare acquired pneumonia. Antibiotics ordered for that. Patient will be admitted to telemetry. Patient will have serial troponins. If  there is significant jump and troponin patient can be transferred to [facility]. Patient followed by cardiology here in the past. Patient also has mild elevation in her BNP patient has a history of CHF. Patient will be given Lasix 80 mg IV here.  Kim Mulders, MD 07/16/14 [phone]

## 2014-07-15 NOTE — ED Notes (Signed)
CRITICAL VALUE ALERT  Critical value received:  Trop 0.53  Date of notification:  07/15/2014  Time of notification:  2343  Critical value read back:Yes.    Nurse who received alert:  Wolfgang Phoenix  MD notified (1st page):  Jodelle Gross  Time of first page:  2341  MD notified (2nd page):  Time of second page:  Responding MD:  Jodelle Gross  Time MD responded:  [phone]

## 2014-07-16 ENCOUNTER — Encounter (HOSPITAL_COMMUNITY): Payer: Self-pay | Admitting: *Deleted

## 2014-07-16 DIAGNOSIS — G8929 Other chronic pain: Secondary | ICD-10-CM | POA: Diagnosis present

## 2014-07-16 DIAGNOSIS — K7460 Unspecified cirrhosis of liver: Secondary | ICD-10-CM | POA: Diagnosis present

## 2014-07-16 DIAGNOSIS — Z86711 Personal history of pulmonary embolism: Secondary | ICD-10-CM | POA: Diagnosis not present

## 2014-07-16 DIAGNOSIS — I509 Heart failure, unspecified: Secondary | ICD-10-CM | POA: Diagnosis present

## 2014-07-16 DIAGNOSIS — Z950 Presence of cardiac pacemaker: Secondary | ICD-10-CM | POA: Diagnosis not present

## 2014-07-16 DIAGNOSIS — B1920 Unspecified viral hepatitis C without hepatic coma: Secondary | ICD-10-CM | POA: Diagnosis present

## 2014-07-16 DIAGNOSIS — I2510 Atherosclerotic heart disease of native coronary artery without angina pectoris: Secondary | ICD-10-CM | POA: Diagnosis present

## 2014-07-16 DIAGNOSIS — Z7901 Long term (current) use of anticoagulants: Secondary | ICD-10-CM

## 2014-07-16 DIAGNOSIS — I4891 Unspecified atrial fibrillation: Secondary | ICD-10-CM | POA: Diagnosis present

## 2014-07-16 DIAGNOSIS — Z8701 Personal history of pneumonia (recurrent): Secondary | ICD-10-CM | POA: Diagnosis not present

## 2014-07-16 DIAGNOSIS — I5043 Acute on chronic combined systolic (congestive) and diastolic (congestive) heart failure: Secondary | ICD-10-CM

## 2014-07-16 DIAGNOSIS — E669 Obesity, unspecified: Secondary | ICD-10-CM | POA: Diagnosis present

## 2014-07-16 DIAGNOSIS — Z8249 Family history of ischemic heart disease and other diseases of the circulatory system: Secondary | ICD-10-CM | POA: Diagnosis not present

## 2014-07-16 DIAGNOSIS — Z91199 Patient's noncompliance with other medical treatment and regimen due to unspecified reason: Secondary | ICD-10-CM | POA: Diagnosis not present

## 2014-07-16 DIAGNOSIS — I4892 Unspecified atrial flutter: Secondary | ICD-10-CM | POA: Diagnosis present

## 2014-07-16 DIAGNOSIS — R799 Abnormal finding of blood chemistry, unspecified: Secondary | ICD-10-CM | POA: Diagnosis present

## 2014-07-16 DIAGNOSIS — I5033 Acute on chronic diastolic (congestive) heart failure: Secondary | ICD-10-CM | POA: Diagnosis present

## 2014-07-16 DIAGNOSIS — R7989 Other specified abnormal findings of blood chemistry: Secondary | ICD-10-CM

## 2014-07-16 DIAGNOSIS — J441 Chronic obstructive pulmonary disease with (acute) exacerbation: Secondary | ICD-10-CM

## 2014-07-16 DIAGNOSIS — I428 Other cardiomyopathies: Secondary | ICD-10-CM | POA: Diagnosis present

## 2014-07-16 DIAGNOSIS — J449 Chronic obstructive pulmonary disease, unspecified: Secondary | ICD-10-CM | POA: Diagnosis present

## 2014-07-16 DIAGNOSIS — E119 Type 2 diabetes mellitus without complications: Secondary | ICD-10-CM | POA: Diagnosis present

## 2014-07-16 DIAGNOSIS — K219 Gastro-esophageal reflux disease without esophagitis: Secondary | ICD-10-CM | POA: Diagnosis present

## 2014-07-16 DIAGNOSIS — J42 Unspecified chronic bronchitis: Secondary | ICD-10-CM

## 2014-07-16 DIAGNOSIS — Z9981 Dependence on supplemental oxygen: Secondary | ICD-10-CM | POA: Diagnosis not present

## 2014-07-16 DIAGNOSIS — E039 Hypothyroidism, unspecified: Secondary | ICD-10-CM | POA: Diagnosis present

## 2014-07-16 DIAGNOSIS — Z9119 Patient's noncompliance with other medical treatment and regimen: Secondary | ICD-10-CM | POA: Diagnosis not present

## 2014-07-16 DIAGNOSIS — R079 Chest pain, unspecified: Secondary | ICD-10-CM

## 2014-07-16 DIAGNOSIS — J189 Pneumonia, unspecified organism: Secondary | ICD-10-CM

## 2014-07-16 DIAGNOSIS — F17200 Nicotine dependence, unspecified, uncomplicated: Secondary | ICD-10-CM | POA: Diagnosis present

## 2014-07-16 DIAGNOSIS — R0789 Other chest pain: Secondary | ICD-10-CM | POA: Diagnosis not present

## 2014-07-16 LAB — COMPREHENSIVE METABOLIC PANEL
ALK PHOS: 82 U/L (ref 39–117)
ALT: 14 U/L (ref 0–35)
ANION GAP: 12 (ref 5–15)
AST: 29 U/L (ref 0–37)
Albumin: 3.3 g/dL — ABNORMAL LOW (ref 3.5–5.2)
BILIRUBIN TOTAL: 0.7 mg/dL (ref 0.3–1.2)
BUN: 7 mg/dL (ref 6–23)
CO2: 29 meq/L (ref 19–32)
Calcium: 8 mg/dL — ABNORMAL LOW (ref 8.4–10.5)
Chloride: 100 mEq/L (ref 96–112)
Creatinine, Ser: 0.73 mg/dL (ref 0.50–1.10)
GLUCOSE: 159 mg/dL — AB (ref 70–99)
POTASSIUM: 3.1 meq/L — AB (ref 3.7–5.3)
Sodium: 141 mEq/L (ref 137–147)
TOTAL PROTEIN: 6.9 g/dL (ref 6.0–8.3)

## 2014-07-16 LAB — URINALYSIS, ROUTINE W REFLEX MICROSCOPIC
BILIRUBIN URINE: NEGATIVE
Glucose, UA: NEGATIVE mg/dL
HGB URINE DIPSTICK: NEGATIVE
KETONES UR: NEGATIVE mg/dL
Leukocytes, UA: NEGATIVE
Nitrite: NEGATIVE
PH: 5.5 (ref 5.0–8.0)
Protein, ur: NEGATIVE mg/dL
SPECIFIC GRAVITY, URINE: 1.015 (ref 1.005–1.030)
Urobilinogen, UA: 0.2 mg/dL (ref 0.0–1.0)

## 2014-07-16 LAB — CBC
HCT: 38.9 % (ref 36.0–46.0)
HEMOGLOBIN: 12.8 g/dL (ref 12.0–15.0)
MCH: 34.7 pg — AB (ref 26.0–34.0)
MCHC: 32.9 g/dL (ref 30.0–36.0)
MCV: 105.4 fL — ABNORMAL HIGH (ref 78.0–100.0)
Platelets: 192 10*3/uL (ref 150–400)
RBC: 3.69 MIL/uL — AB (ref 3.87–5.11)
RDW: 15.1 % (ref 11.5–15.5)
WBC: 7.4 10*3/uL (ref 4.0–10.5)

## 2014-07-16 LAB — TROPONIN I
TROPONIN I: 0.47 ng/mL — AB (ref <0.30)
Troponin I: 0.42 ng/mL (ref <0.30)
Troponin I: 0.55 ng/mL (ref <0.30)

## 2014-07-16 LAB — PROTIME-INR
INR: 1.34 (ref 0.00–1.49)
Prothrombin Time: 16.6 seconds — ABNORMAL HIGH (ref 11.6–15.2)

## 2014-07-16 LAB — MRSA PCR SCREENING: MRSA by PCR: POSITIVE — AB

## 2014-07-16 MED ORDER — AMLODIPINE BESYLATE 5 MG PO TABS
2.5000 mg | ORAL_TABLET | Freq: Every day | ORAL | Status: DC
  Administered 2014-07-16 – 2014-07-17 (×2): 2.5 mg via ORAL
  Filled 2014-07-16 (×2): qty 1

## 2014-07-16 MED ORDER — WARFARIN - PHARMACIST DOSING INPATIENT
Status: DC
  Administered 2014-07-16: 16:00:00

## 2014-07-16 MED ORDER — ONDANSETRON HCL 4 MG/2ML IJ SOLN
4.0000 mg | Freq: Once | INTRAMUSCULAR | Status: AC
  Administered 2014-07-16: 4 mg via INTRAVENOUS
  Filled 2014-07-16: qty 2

## 2014-07-16 MED ORDER — MOMETASONE FURO-FORMOTEROL FUM 100-5 MCG/ACT IN AERO
2.0000 | INHALATION_SPRAY | Freq: Two times a day (BID) | RESPIRATORY_TRACT | Status: DC
  Administered 2014-07-16 – 2014-07-17 (×3): 2 via RESPIRATORY_TRACT
  Filled 2014-07-16 (×19): qty 8.8

## 2014-07-16 MED ORDER — IPRATROPIUM-ALBUTEROL 0.5-2.5 (3) MG/3ML IN SOLN
3.0000 mL | Freq: Three times a day (TID) | RESPIRATORY_TRACT | Status: DC
  Administered 2014-07-16 – 2014-07-17 (×3): 3 mL via RESPIRATORY_TRACT
  Filled 2014-07-16 (×3): qty 3

## 2014-07-16 MED ORDER — DEXTROSE 5 % IV SOLN
1.0000 g | Freq: Three times a day (TID) | INTRAVENOUS | Status: DC
  Administered 2014-07-16 – 2014-07-17 (×3): 1 g via INTRAVENOUS
  Filled 2014-07-16 (×7): qty 1

## 2014-07-16 MED ORDER — VANCOMYCIN HCL IN DEXTROSE 1-5 GM/200ML-% IV SOLN
1000.0000 mg | Freq: Once | INTRAVENOUS | Status: AC
  Administered 2014-07-16: 1000 mg via INTRAVENOUS
  Filled 2014-07-16: qty 200

## 2014-07-16 MED ORDER — AZTREONAM 2 G IJ SOLR
2.0000 g | Freq: Once | INTRAMUSCULAR | Status: AC
  Administered 2014-07-16: 2 g via INTRAVENOUS
  Filled 2014-07-16: qty 2

## 2014-07-16 MED ORDER — VANCOMYCIN HCL IN DEXTROSE 1-5 GM/200ML-% IV SOLN
INTRAVENOUS | Status: AC
  Filled 2014-07-16: qty 200

## 2014-07-16 MED ORDER — ATORVASTATIN CALCIUM 20 MG PO TABS
20.0000 mg | ORAL_TABLET | Freq: Every day | ORAL | Status: DC
  Administered 2014-07-16: 20 mg via ORAL
  Filled 2014-07-16: qty 1

## 2014-07-16 MED ORDER — HYDROMORPHONE HCL PF 1 MG/ML IJ SOLN
1.0000 mg | INTRAMUSCULAR | Status: DC | PRN
  Administered 2014-07-16 (×2): 1 mg via INTRAVENOUS
  Filled 2014-07-16 (×2): qty 1

## 2014-07-16 MED ORDER — SODIUM CHLORIDE 0.9 % IV SOLN: INTRAVENOUS | Status: AC

## 2014-07-16 MED ORDER — POTASSIUM CHLORIDE CRYS ER 20 MEQ PO TBCR
40.0000 meq | EXTENDED_RELEASE_TABLET | Freq: Two times a day (BID) | ORAL | Status: AC
  Administered 2014-07-16 (×2): 40 meq via ORAL
  Filled 2014-07-16 (×2): qty 2

## 2014-07-16 MED ORDER — ASPIRIN EC 81 MG PO TBEC
81.0000 mg | DELAYED_RELEASE_TABLET | Freq: Every day | ORAL | Status: DC
  Administered 2014-07-16 – 2014-07-17 (×2): 81 mg via ORAL
  Filled 2014-07-16 (×2): qty 1

## 2014-07-16 MED ORDER — METOPROLOL SUCCINATE ER 25 MG PO TB24
25.0000 mg | ORAL_TABLET | Freq: Every day | ORAL | Status: DC
  Administered 2014-07-16 – 2014-07-17 (×2): 25 mg via ORAL
  Filled 2014-07-16 (×2): qty 1

## 2014-07-16 MED ORDER — FUROSEMIDE 10 MG/ML IJ SOLN
40.0000 mg | Freq: Two times a day (BID) | INTRAMUSCULAR | Status: DC
  Administered 2014-07-16 – 2014-07-17 (×3): 40 mg via INTRAVENOUS
  Filled 2014-07-16 (×3): qty 4

## 2014-07-16 MED ORDER — QUETIAPINE FUMARATE 25 MG PO TABS
50.0000 mg | ORAL_TABLET | Freq: Two times a day (BID) | ORAL | Status: DC
  Administered 2014-07-16 – 2014-07-17 (×4): 50 mg via ORAL
  Filled 2014-07-16 (×4): qty 2

## 2014-07-16 MED ORDER — ONDANSETRON HCL 4 MG/2ML IJ SOLN: 4.0000 mg | Freq: Once | INTRAMUSCULAR | Status: DC

## 2014-07-16 MED ORDER — ONDANSETRON HCL 4 MG/2ML IJ SOLN
4.0000 mg | Freq: Four times a day (QID) | INTRAMUSCULAR | Status: DC | PRN
Start: 2014-07-16 — End: 2014-07-17
  Administered 2014-07-16: 4 mg via INTRAVENOUS
  Filled 2014-07-16: qty 2

## 2014-07-16 MED ORDER — MUPIROCIN 2 % EX OINT
1.0000 "application " | TOPICAL_OINTMENT | Freq: Two times a day (BID) | CUTANEOUS | Status: DC
  Administered 2014-07-16 – 2014-07-17 (×2): 1 via NASAL
  Filled 2014-07-16: qty 22

## 2014-07-16 MED ORDER — OXYCODONE HCL 5 MG PO TABS
5.0000 mg | ORAL_TABLET | Freq: Four times a day (QID) | ORAL | Status: DC | PRN
  Administered 2014-07-16 – 2014-07-17 (×3): 5 mg via ORAL
  Filled 2014-07-16 (×3): qty 1

## 2014-07-16 MED ORDER — IPRATROPIUM-ALBUTEROL 0.5-2.5 (3) MG/3ML IN SOLN
3.0000 mL | RESPIRATORY_TRACT | Status: DC | PRN
  Administered 2014-07-16: 3 mL via RESPIRATORY_TRACT
  Filled 2014-07-16: qty 3

## 2014-07-16 MED ORDER — ENOXAPARIN SODIUM 40 MG/0.4ML ~~LOC~~ SOLN: 40.0000 mg | SUBCUTANEOUS | Status: DC

## 2014-07-16 MED ORDER — PANTOPRAZOLE SODIUM 40 MG PO TBEC
40.0000 mg | DELAYED_RELEASE_TABLET | Freq: Every day | ORAL | Status: DC
  Administered 2014-07-16 – 2014-07-17 (×2): 40 mg via ORAL
  Filled 2014-07-16 (×2): qty 1

## 2014-07-16 MED ORDER — WARFARIN - PHARMACIST DOSING INPATIENT: Freq: Every day | Status: DC

## 2014-07-16 MED ORDER — CHLORHEXIDINE GLUCONATE CLOTH 2 % EX PADS
6.0000 | MEDICATED_PAD | Freq: Every day | CUTANEOUS | Status: DC
Start: 2014-07-17 — End: 2014-07-17
  Administered 2014-07-17: 6 via TOPICAL

## 2014-07-16 MED ORDER — WARFARIN SODIUM 5 MG PO TABS
5.0000 mg | ORAL_TABLET | Freq: Once | ORAL | Status: AC
  Administered 2014-07-16: 5 mg via ORAL
  Filled 2014-07-16: qty 1

## 2014-07-16 MED ORDER — WARFARIN SODIUM 2 MG PO TABS
4.0000 mg | ORAL_TABLET | Freq: Every day | ORAL | Status: DC
  Administered 2014-07-16: 4 mg via ORAL
  Filled 2014-07-16: qty 2

## 2014-07-16 MED ORDER — POTASSIUM CHLORIDE CRYS ER 20 MEQ PO TBCR
20.0000 meq | EXTENDED_RELEASE_TABLET | Freq: Every day | ORAL | Status: DC
  Administered 2014-07-16 – 2014-07-17 (×2): 20 meq via ORAL
  Filled 2014-07-16 (×3): qty 1

## 2014-07-16 MED ORDER — FUROSEMIDE 10 MG/ML IJ SOLN
80.0000 mg | Freq: Once | INTRAMUSCULAR | Status: AC
  Administered 2014-07-16: 80 mg via INTRAVENOUS
  Filled 2014-07-16: qty 8

## 2014-07-16 MED ORDER — VANCOMYCIN HCL IN DEXTROSE 750-5 MG/150ML-% IV SOLN
750.0000 mg | Freq: Three times a day (TID) | INTRAVENOUS | Status: DC
  Administered 2014-07-16 – 2014-07-17 (×4): 750 mg via INTRAVENOUS
  Filled 2014-07-16 (×7): qty 150

## 2014-07-16 MED ORDER — ONDANSETRON HCL 4 MG PO TABS: 4.0000 mg | ORAL_TABLET | Freq: Four times a day (QID) | ORAL | Status: DC | PRN

## 2014-07-16 NOTE — Progress Notes (Signed)
Nutrition Brief Note  Patient identified on the Malnutrition Screening Tool (MST) Report  Wt Readings from Last 15 Encounters:  07/15/14 210 lb (95.255 kg)  06/28/14 230 lb (104.327 kg)  06/24/14 230 lb (104.327 kg)  06/14/14 229 lb 14.4 oz (104.282 kg)  06/06/14 237 lb 3 oz (107.588 kg)  05/22/14 229 lb 4.5 oz (104 kg)  03/10/14 229 lb 14.4 oz (104.282 kg)  02/14/14 240 lb (108.863 kg)  02/03/14 240 lb (108.863 kg)  01/18/14 240 lb (108.863 kg)  01/09/14 239 lb 1.6 oz (108.455 kg)  01/09/14 239 lb 1.6 oz (108.455 kg)  12/23/13 240 lb 9.6 oz (109.135 kg)  12/17/13 232 lb 5.8 oz (105.4 kg)  10/11/13 224 lb 6.9 oz (64.58 kg)   48 year old female who has a past medical history of COPD (chronic obstructive pulmonary disease); Coronary atherosclerosis of native coronary artery; Hypothyroidism; Hepatitis C; Pneumonia; Cirrhosis; Type 2 diabetes mellitus; GERD (gastroesophageal reflux disease); Headache(784.0); Anxiety; Atrial fibrillation and flutter; Chronic pain; PE (pulmonary embolism); Chronic anticoagulation; History of medication noncompliance; SSS (sick sinus syndrome); and Cardiomyopathy.  Today presents to the ED with chest pain which started 3 days ago, she also complained of palpitations. Patient was discharged from the hospital on 07/01/2014 of the patient was diagnosed with noncardiac chest pain, she had mild elevation of troponin. She was seen by cardiology, she has history of CAD predominantly nonobstructive, a recent DEXA scan showed mild area of ischemia versus soft tissue attenuation. Patient has pacemaker in place, chronic atrial flutter on EKG. She is currently on anticoagulation with warfarin. Patient has history of COPD and is on home oxygen.  Patient says that the pain is substernal, sharp, reproducible on palpation. The pain does not radiate, she has shortness of breath. Also has been coughing up phlegm. She denies fever. Admits to nausea, vomiting and diarrhea.  In the ED  patient found to have elevated troponin 0.53, with EKG again showing atrial flutter.  Spoke with pt at bedside. She reports that her appetite has come back. Noted 75% completion of lunch tray. She reports poor appetite PTA x 3 days, but appetite was good prior to that.   She denies any weight loss. Noted pt with edema and is currently receiving diuretics. Wt loss and wt fluctuations likely due to fluid changes.   Pt not appropriate for education at this time. Pt drowsy at time of visit was more concerned about availability of menu items for upcoming meals than diet education.   Body mass index is 30.13 kg/(m^2). Patient meets criteria for obesity, class I based on current BMI.   Current diet order is Heart Healthy, patient is consuming approximately 75% of meals at this time. Labs and medications reviewed.   No nutrition interventions warranted at this time. If nutrition issues arise, please consult RD.   Shanai Lartigue A. Mayford Knife, RD, LDN Pager: [phone]

## 2014-07-16 NOTE — Progress Notes (Signed)
ANTIBIOTIC CONSULT NOTE - INITIAL  Pharmacy Consult for vancomycin Indication: HCAP (?)  Allergies  Allergen Reactions  . Nitroglycerin Other (See Comments)    CAUSED NUMBNESS ALL OVER  . Doxycycline Itching  . Flexeril [Cyclobenzaprine] Other (See Comments)    Sweating, Lightheaded   . Ibuprofen Other (See Comments)    Increase BP, dizziness, lightheaded  . Tramadol Itching  . Tylenol [Acetaminophen] Other (See Comments)    Pt has Hep C  . Vesicare [Solifenacin] Other (See Comments)    Makes my sweat turn yellow  . Amoxil [Amoxicillin] Nausea Only    Patient Measurements: Height: 5\' 10"  (177.8 cm) Weight: 210 lb (95.255 kg) IBW/kg (Calculated) : 68.5 Adjusted Body Weight: 75 kg  Vital Signs: Temp: 98.1 F (36.7 C) (08/09 2115) Temp src: Oral (08/09 2115) BP: 151/100 mmHg (08/09 2300) Pulse Rate: 79 (08/09 2245) Intake/Output from previous day: 08/09 0701 - 08/10 0700 In: 1000 [I.V.:1000] Out: -  Intake/Output from this shift: Total I/O In: 1000 [I.V.:1000] Out: -   Labs:  Recent Labs  07/15/14 2300  CREATININE 0.73   Estimated Creatinine Clearance: 106.4 ml/min (by C-G formula based on Cr of 0.73). No results found for this basename: Rolm Gala, VANCORANDOM, GENTTROUGH, GENTPEAK, GENTRANDOM, TOBRATROUGH, TOBRAPEAK, TOBRARND, AMIKACINPEAK, AMIKACINTROU, AMIKACIN,  in the last 72 hours   Microbiology: Recent Results (from the past 720 hour(s))  URINE CULTURE     Status: None   Collection Time    06/24/14  4:23 PM      Result Value Ref Range Status   Specimen Description URINE, CLEAN CATCH   Final   Special Requests NONE   Final   Culture  Setup Time     Final   Value: 06/25/2014 14:50     Performed at [facility] Count     Final   Value: NO GROWTH     Performed at [facility]   Culture     Final   Value: NO GROWTH     Performed at [facility]   Report Status 06/26/2014 FINAL   Final    Medical  History: Past Medical History  Diagnosis Date  . COPD (chronic obstructive pulmonary disease)     Home O2 3L  . Coronary atherosclerosis of native coronary artery     Cardiac catheterization 07/2012 - LAD 40%; small D2 with 60 to 70% ostial; OM1 30 to 40%; RCA 20%; PL1 40%   . Hypothyroidism   . Hepatitis C   . Pneumonia   . Cirrhosis   . Type 2 diabetes mellitus   . GERD (gastroesophageal reflux disease)   . Headache(784.0)   . Anxiety   . Atrial fibrillation and flutter   . Chronic pain   . PE (pulmonary embolism)   . Chronic anticoagulation   . History of medication noncompliance   . SSS (sick sinus syndrome)     Biotronik pacemaker  . Cardiomyopathy     LVEF 40-45%    Medications:  Scheduled:  . vancomycin  750 mg Intravenous Q8H   Infusions:  . sodium chloride 1 mL/hr (07/16/14 0100)  . sodium chloride    . aztreonam 2 g (07/16/14 0104)  . vancomycin     PRN:   Assessment: 41yr female with complicated medical hx (COPD, hx of PE cardioyopathy, implanted pacemaker, cirrhosis, Hep C, DM, hypothyroidism) - now with clinical sx's of pneumonia.  Patient started on Azactam & Vancomycin.  Goal of Therapy:  Desire  vancomycin serum trough to be 15-3720mcg/ml  Plan:  1.  Vancomycin 1gm IV x 1 then 750mg  IV q8h 2.  Follow-up on need for continuation of Azactam tx or another antibiotic 3.  Monitor indices of infection and renal function 4.  Get steady state vancomycin serum trough level as clinically indicated  Stepfanie Yott E 07/16/2014,12:58 AM

## 2014-07-16 NOTE — Care Management Note (Signed)
    Page 1 of 1   07/17/2014     2:59:22 PM CARE MANAGEMENT NOTE 07/17/2014  Patient:  Kim [name], Kim [name]   Account Number:  [id number]  Date Initiated:  07/16/2014  Documentation initiated by:  Anibal Henderson  Subjective/Objective Assessment:   Admitted with CP, CHF, COPD,HCAP, Hx PE. Pt is from home, she is on home O2. She lives at home with grown children, and plans to return home with D/C.     Action/Plan:   Pt uses [facility] for HH, and says she does not have them active at present, but likes to have them when needed. Will follow for needs   Anticipated DC Date:  07/19/2014   Anticipated DC Plan:  HOME W HOME HEALTH SERVICES      DC Planning Services  CM consult      [facility] Choice  HOME HEALTH   Choice offered to / List presented to:  C-1 Patient        HH arranged  HH-1 RN      [facility] agency  [facility]   Status of service:  Completed, signed off Medicare Important Message given?   (If response is "NO", the following Medicare IM given date fields will be blank) Date Medicare IM given:   Medicare IM given by:   Date Additional Medicare IM given:   Additional Medicare IM given by:    Discharge Disposition:  HOME W HOME HEALTH SERVICES  Per UR Regulation:  Reviewed for med. necessity/level of care/duration of stay  If discussed at Long Length of Stay Meetings, dates discussed:    Comments:  07/17/14 1500 Arlyss Queen, RN BSN CM Pt discharged home today with [facility] RN (per pts choice). Alroy Bailiff of [facility] is aware and will collect the pts information from the chart. No DME needs noted. Pt and pts nurse aware of discharge arrangements.  07/16/14 1300 Anibal Henderson RN/CM

## 2014-07-16 NOTE — Progress Notes (Signed)
UR chart review completed.  

## 2014-07-16 NOTE — Progress Notes (Signed)
ANTICOAGULATION CONSULT NOTE - follow up  Pharmacy Consult for warfarin Indication: chronic afib, hx of PE  Allergies  Allergen Reactions  . Nitroglycerin Other (See Comments)    CAUSED NUMBNESS ALL OVER  . Doxycycline Itching  . Flexeril [Cyclobenzaprine] Other (See Comments)    Sweating, Lightheaded   . Ibuprofen Other (See Comments)    Increase BP, dizziness, lightheaded  . Tramadol Itching  . Tylenol [Acetaminophen] Other (See Comments)    Pt has Hep C  . Vesicare [Solifenacin] Other (See Comments)    Makes my sweat turn yellow  . Amoxil [Amoxicillin] Nausea Only   Patient Measurements: Height: 5\' 10"  (177.8 cm) Weight: 210 lb (95.255 kg) IBW/kg (Calculated) : 68.5  Vital Signs: Temp: 98 F (36.7 C) (08/10 0600) Temp src: Oral (08/10 0600) BP: 152/82 mmHg (08/10 0600) Pulse Rate: 74 (08/10 0600)  Labs:  Recent Labs  07/15/14 2300 07/16/14 0004 07/16/14 0157 07/16/14 0620  HGB  --   --   --  12.8  HCT  --   --   --  38.9  PLT  --   --   --  192  LABPROT  --  16.6*  --   --   INR  --  1.34  --   --   CREATININE 0.73  --   --  0.73  TROPONINI 0.53*  --  0.42*  --    Medical History: Past Medical History  Diagnosis Date  . COPD (chronic obstructive pulmonary disease)     Home O2 3L  . Coronary atherosclerosis of native coronary artery     Cardiac catheterization 07/2012 - LAD 40%; small D2 with 60 to 70% ostial; OM1 30 to 40%; RCA 20%; PL1 40%   . Hypothyroidism   . Hepatitis C   . Pneumonia   . Cirrhosis   . Type 2 diabetes mellitus   . GERD (gastroesophageal reflux disease)   . Headache(784.0)   . Anxiety   . Atrial fibrillation and flutter   . Chronic pain   . PE (pulmonary embolism)   . Chronic anticoagulation   . History of medication noncompliance   . SSS (sick sinus syndrome)     Biotronik pacemaker  . Cardiomyopathy     LVEF 40-45%   Medications:  Scheduled:  . sodium chloride   Intravenous STAT  . amLODipine  2.5 mg Oral Daily  .  aspirin EC  81 mg Oral Daily  . atorvastatin  20 mg Oral QPC supper  . furosemide  40 mg Intravenous BID  . metoprolol succinate  25 mg Oral Daily  . mometasone-formoterol  2 puff Inhalation BID  . pantoprazole  40 mg Oral Daily  . potassium chloride SA  20 mEq Oral Daily  . QUEtiapine  50 mg Oral BID  . vancomycin  750 mg Intravenous Q8H  . warfarin  5 mg Oral Once  . Warfarin - Pharmacist Dosing Inpatient   Does not apply Q24H   Assessment: 48yo female - not sure how compliant with chronic anticoagulation medications - previously on warfarin 4mg  daily with INR's approx 1.7-2.1; pltc 183-215; H/H 14.2-13.4/41-38.7 at the end of July.   Home dose reportedly 4mg  daily.  INR is subtherapeutic on admission.  Goal of Therapy:  Desire INR of 2-2.5 on warfarin.   Plan:  Coumadin 5mg  po today x 1 to boost INR INR daily  Valrie Hart A 07/16/2014,8:04 AM

## 2014-07-16 NOTE — H&P (Signed)
PCP:   Dorian HeckleGILLIAM, RYAN D, MD   Chief Complaint:  Chest pain  HPI: 48 year old female who  has a past medical history of COPD (chronic obstructive pulmonary disease); Coronary atherosclerosis of native coronary artery; Hypothyroidism; Hepatitis C; Pneumonia; Cirrhosis; Type 2 diabetes mellitus; GERD (gastroesophageal reflux disease); Headache(784.0); Anxiety; Atrial fibrillation and flutter; Chronic pain; PE (pulmonary embolism); Chronic anticoagulation; History of medication noncompliance; SSS (sick sinus syndrome); and Cardiomyopathy. Today presents to the ED with chest pain which started 3 days ago, she also complained of palpitations. Patient was discharged from the hospital on 07/01/2014 of the patient was diagnosed with noncardiac chest pain, she had mild elevation of troponin. She was seen by cardiology, she has history of CAD predominantly nonobstructive, a recent DEXA scan showed mild area of ischemia versus soft tissue attenuation. Patient has pacemaker in place, chronic atrial flutter on EKG. She is currently on anticoagulation with warfarin. Patient has history of COPD and is on home oxygen. Patient says that the pain is substernal, sharp, reproducible on palpation. The pain does not radiate, she has shortness of breath. Also has been coughing up phlegm. She denies fever. Admits to nausea, vomiting and diarrhea. In the ED patient found to have elevated troponin 0.53, with EKG again showing atrial flutter.  Allergies:   Allergies  Allergen Reactions  . Nitroglycerin Other (See Comments)    CAUSED NUMBNESS ALL OVER  . Doxycycline Itching  . Flexeril [Cyclobenzaprine] Other (See Comments)    Sweating, Lightheaded   . Ibuprofen Other (See Comments)    Increase BP, dizziness, lightheaded  . Tramadol Itching  . Tylenol [Acetaminophen] Other (See Comments)    Pt has Hep C  . Vesicare [Solifenacin] Other (See Comments)    Makes my sweat turn yellow  . Amoxil [Amoxicillin] Nausea Only       Past Medical History  Diagnosis Date  . COPD (chronic obstructive pulmonary disease)     Home O2 3L  . Coronary atherosclerosis of native coronary artery     Cardiac catheterization 07/2012 - LAD 40%; small D2 with 60 to 70% ostial; OM1 30 to 40%; RCA 20%; PL1 40%   . Hypothyroidism   . Hepatitis C   . Pneumonia   . Cirrhosis   . Type 2 diabetes mellitus   . GERD (gastroesophageal reflux disease)   . Headache(784.0)   . Anxiety   . Atrial fibrillation and flutter   . Chronic pain   . PE (pulmonary embolism)   . Chronic anticoagulation   . History of medication noncompliance   . SSS (sick sinus syndrome)     Biotronik pacemaker  . Cardiomyopathy     LVEF 40-45%    Past Surgical History  Procedure Laterality Date  . Cholecystectomy    . Pacemaker insertion  02/2012    Biotronik PPM   . Cardiac electrophysiology study and ablation    . Esophagogastroduodenoscopy  Jan 2010    Dr. Arlyce DiceKaplan: multiple gastric erosions, benign path  . Esophagogastroduodenoscopy N/A 01/10/2014    WUJ:[id number]:Patulous EG junction. Query short segment Barrett's esophagus-status post pinch biopsy as described above. Hiatal hernia; otherwise negative EGD    Prior to Admission medications   Medication Sig Start Date End Date Taking? Authorizing Provider  amLODipine (NORVASC) 2.5 MG tablet Take 1 tablet (2.5 mg total) by mouth daily. 07/01/14  Yes Standley Brookinganiel P Goodrich, MD  aspirin EC 81 MG tablet Take 1 tablet (81 mg total) by mouth daily. 07/01/14  Yes Standley Brookinganiel P Goodrich, MD  atorvastatin (LIPITOR) 20 MG tablet Take 20 mg by mouth daily.   Yes Historical Provider, MD  budesonide-formoterol (SYMBICORT) 80-4.5 MCG/ACT inhaler Inhale 2 puffs into the lungs 2 (two) times daily.   Yes Historical Provider, MD  furosemide (LASIX) 80 MG tablet Take 1 tablet (80 mg total) by mouth 2 (two) times daily. 06/14/14  Yes Standley Brooking, MD  metoprolol succinate (TOPROL-XL) 25 MG 24 hr tablet Take 1 tablet (25 mg total) by  mouth daily. 06/14/14  Yes Lesle Chris Black, NP  pantoprazole (PROTONIX) 40 MG tablet Take 40 mg by mouth daily.   Yes Historical Provider, MD  potassium chloride SA (K-DUR,KLOR-CON) 20 MEQ tablet Take 1 tablet (20 mEq total) by mouth daily. 06/07/14  Yes Lyanne Co, MD  QUEtiapine (SEROQUEL) 25 MG tablet Take 50 mg by mouth 2 (two) times daily. 12/08/13  Yes Historical Provider, MD  tiotropium (SPIRIVA) 18 MCG inhalation capsule Place 18 mcg into inhaler and inhale daily.   Yes Historical Provider, MD  warfarin (COUMADIN) 4 MG tablet Take 4 mg by mouth daily.   Yes Historical Provider, MD  albuterol (PROVENTIL HFA;VENTOLIN HFA) 108 (90 BASE) MCG/ACT inhaler Inhale 2 puffs into the lungs every 6 (six) hours as needed for wheezing or shortness of breath.     Historical Provider, MD    Social History:  reports that she has been smoking Cigarettes.  She has a 15 pack-year smoking history. Her smokeless tobacco use includes Snuff. She reports that she does not drink alcohol or use illicit drugs.  Family History  Problem Relation Age of Onset  . Coronary artery disease Father 37  . Coronary artery disease Mother 20  . Coronary artery disease Brother   . Colon cancer Neg Hx      All the positives are listed in BOLD  Review of Systems:  HEENT: Headache, blurred vision, runny nose, sore throat Neck: Hypothyroidism, hyperthyroidism,,lymphadenopathy Chest : Shortness of breath, history of COPD, Asthma Heart : Chest pain, history of coronary arterey disease GI:  Nausea, vomiting, diarrhea, constipation, GERD GU: Dysuria, urgency, frequency of urination, hematuria Neuro: Stroke, seizures, syncope Psych: Depression, anxiety, hallucinations   Physical Exam: Blood pressure 151/100, pulse 79, temperature 98.1 F (36.7 C), temperature source Oral, resp. rate 23, height 5\' 10"  (1.778 m), weight 95.255 kg (210 lb), last menstrual period 02/08/2007, SpO2 97.00%. Constitutional:   Patient is a morbidly  obese female in no acute distress and cooperative with exam. Head: Normocephalic and atraumatic Mouth: Mucus membranes moist Eyes: PERRL, EOMI, conjunctivae normal Neck: Supple, No Thyromegaly Cardiovascular: Bilateral wheezing Pulmonary/Chest: CTAB, no wheezes, rales, or rhonchi Abdominal: Soft. Non-tender, non-distended, bowel sounds are normal, no masses, organomegaly, or guarding present.  Neurological: A&O x3, Strenght is normal and symmetric bilaterally, cranial nerve II-XII are grossly intact, no focal motor deficit, sensory intact to light touch bilaterally.  Extremities : No Cyanosis, Clubbing or Edema  Labs on Admission:  Basic Metabolic Panel:  Recent Labs Lab 07/15/14 2300  NA 140  K 3.6*  CL 100  CO2 30  GLUCOSE 106*  BUN 7  CREATININE 0.73  CALCIUM 8.6   Liver Function Tests:  Recent Labs Lab 07/15/14 2300  AST 32  ALT 14  ALKPHOS 80  BILITOT 0.8  PROT 7.2  ALBUMIN 3.4*    Recent Labs Lab 07/15/14 2300  LIPASE 30   No results found for this basename: AMMONIA,  in the last 168 hours CBC: No results found for this basename: WBC,  NEUTROABS, HGB, HCT, MCV, PLT,  in the last 168 hours Cardiac Enzymes:  Recent Labs Lab 07/15/14 2300  TROPONINI 0.53*    BNP (last 3 results)  Recent Labs  06/12/14 2137 06/28/14 2325 07/15/14 2300  PROBNP 1342.0* 1712.0* 2008.0*   CBG: No results found for this basename: GLUCAP,  in the last 168 hours  Radiological Exams on Admission: Dg Chest 2 View  07/15/2014   CLINICAL DATA:  Shortness of breath, cough and weakness.  EXAM: CHEST  2 VIEW  COMPARISON:  Chest radiograph performed 06/28/2014  FINDINGS: There is a mildly loculated small right pleural effusion, with right basilar airspace opacification. This likely reflects pneumonia. The left lung appears relatively clear. No pneumothorax is seen.  The heart is enlarged. A pacemaker is seen at the left chest wall, with leads ending at the right atrium and right  ventricle. No acute osseous abnormalities are identified.  IMPRESSION: 1. Mildly loculated small right pleural effusion, with right basilar airspace opacification. This likely reflects pneumonia. Would complete treatment for pneumonia, then perform follow-up chest radiograph to ensure resolution of airspace opacities 2. Cardiomegaly.   Electronically Signed   By: Roanna Raider M.D.   On: 07/15/2014 22:31    EKG: Independently reviewed. Atrial flutter   Assessment/Plan Principal Problem:   Chest pain Active Problems:   COPD (chronic obstructive pulmonary disease)   HCAP (healthcare-associated pneumonia)   Diarrhea   Elevated troponin I level   Chronic anticoagulation   Acute on chronic diastolic heart failure   healthcare associated pneumonia  Chest pain Patient had similar presentation 2 weeks ago, when she had mild elevation of troponin and was seen by cardiology. At that time it was deemed that patient had noncardiac chest pain. She again has mild elevation of troponin 0.53 today, with atypical chest pain which is reproducible on palpation in the sternal area. Physician called cardiology fellow at [facility] reviewed patient's records and recommended to manage the patient at [facility] conservatively. Will obtain serial cardiac enzymes, Dilaudid when necessary for pain., Aspirin, statin. Patient is on anticoagulation with Coumadin, INR is subtherapeutic, no heparin recommended by cardiology.  Healthcare associated pneumonia Patient has cough with yellow phlegm, chest x-ray shows pneumonia. We'll treat with vancomycin and Azactam as healthcare associated pneumonia. Blood cultures have been obtained.  Diarrhea Patient complains of nausea vomiting and diarrhea, will obtain GI pathogen panel.  Acute on chronic diastolic CHF Echocardiogram had shown septal hypertrophy, review of records from [facility] reveals patient has chronic diastolic CHF. Today BNP is elevated to 2008.  She takes Lasix  80 mg twice a day at home. Will start Lasix 40 mg IV every 12 hours from tomorrow morning. She did receive one dose of Lasix 80 mg IV in the ED  Atrial flutter Continue Coumadin, metoprolol. Heart rate is well controlled at this time  History of pulmonary embolism Patient history of PE, as per the records continue Coumadin  COPD Continue oxygen therapy, will start DuoNeb nebulizers every 6 hours when necessary  Code status: Patient is full code  Family discussion: No family at bedside   Time Spent on Admission: 65 minutes  LAMA,GAGAN S [name] Pager: [phone] 07/16/2014, 1:19 AM  If 7PM-7AM, please contact night-coverage  [email or web address]  Password [email or web address]

## 2014-07-16 NOTE — Progress Notes (Addendum)
   Follow Up Note  Pt admitted earlier this morning.  Seen after arrived to floor.    Exam: CV: Regular rate and rhythm, S1-S2, 2/6 systolic ejection murmur Lungs: Minimal end expiratory wheeze, decreased breath sounds throughout Abd: Soft, obese, nontender, positive bowel sounds Ext: Clubbing or cyanosis, trace edema from the knees down. Noted multiple scabs  Present on Admission:  . Chest pain . HCAP (healthcare-associated pneumonia): On Azactam and vancomycin  . Diarrhea: No further episodes. Continue to monitor.  Marland Kitchen COPD (chronic obstructive pulmonary disease): Mild. Continue nebulizers  . Elevated troponin I level: Likely more related to her diastolic heart failure and COPD. Continue to monitor enzymes. This is not from a non-ST elevated MI. Patient on chronic home oxygen 2 L  . Acute on chronic diastolic heart failure: On IV diuretics. We'll check ambulatory pulse ox.  . Obesity: Patient quit. BMI greater than 30  . Tobacco abuse counseled.  . Atrial fibrillation: Status post pacemaker. On chronic anticoagulation.  Additional time spent in exam in medical decision-making: 35 minutes

## 2014-07-16 NOTE — Progress Notes (Signed)
ANTICOAGULATION CONSULT NOTE - Initial Consult  Pharmacy Consult for warfarin Indication: chronic afib, hx of PE  Allergies  Allergen Reactions  . Nitroglycerin Other (See Comments)    CAUSED NUMBNESS ALL OVER  . Doxycycline Itching  . Flexeril [Cyclobenzaprine] Other (See Comments)    Sweating, Lightheaded   . Ibuprofen Other (See Comments)    Increase BP, dizziness, lightheaded  . Tramadol Itching  . Tylenol [Acetaminophen] Other (See Comments)    Pt has Hep C  . Vesicare [Solifenacin] Other (See Comments)    Makes my sweat turn yellow  . Amoxil [Amoxicillin] Nausea Only    Patient Measurements: Height: 5\' 10"  (177.8 cm) Weight: 210 lb (95.255 kg) IBW/kg (Calculated) : 68.5   Vital Signs: Temp: 97.7 F (36.5 C) (08/10 0139) Temp src: Oral (08/10 0139) BP: 154/81 mmHg (08/10 0139) Pulse Rate: 74 (08/10 0139)  Labs:  Recent Labs  07/15/14 2300 07/16/14 0004  LABPROT  --  16.6*  INR  --  1.34  CREATININE 0.73  --   TROPONINI 0.53*  --     Estimated Creatinine Clearance: 106.4 ml/min (by C-G formula based on Cr of 0.73).   Medical History: Past Medical History  Diagnosis Date  . COPD (chronic obstructive pulmonary disease)     Home O2 3L  . Coronary atherosclerosis of native coronary artery     Cardiac catheterization 07/2012 - LAD 40%; small D2 with 60 to 70% ostial; OM1 30 to 40%; RCA 20%; PL1 40%   . Hypothyroidism   . Hepatitis C   . Pneumonia   . Cirrhosis   . Type 2 diabetes mellitus   . GERD (gastroesophageal reflux disease)   . Headache(784.0)   . Anxiety   . Atrial fibrillation and flutter   . Chronic pain   . PE (pulmonary embolism)   . Chronic anticoagulation   . History of medication noncompliance   . SSS (sick sinus syndrome)     Biotronik pacemaker  . Cardiomyopathy     LVEF 40-45%    Medications:  Scheduled:  . sodium chloride   Intravenous STAT  . amLODipine  2.5 mg Oral Daily  . aspirin EC  81 mg Oral Daily  . atorvastatin   20 mg Oral QPC supper  . furosemide  40 mg Intravenous BID  . metoprolol succinate  25 mg Oral Daily  . mometasone-formoterol  2 puff Inhalation BID  . pantoprazole  40 mg Oral Daily  . potassium chloride SA  20 mEq Oral Daily  . QUEtiapine  50 mg Oral BID  . vancomycin  1,000 mg Intravenous Once  . vancomycin  750 mg Intravenous Q8H  . warfarin  4 mg Oral q1800  . Warfarin - Pharmacist Dosing Inpatient   Does not apply q1800   Infusions:   PRN: HYDROmorphone (DILAUDID) injection, ipratropium-albuterol, ondansetron (ZOFRAN) IV, ondansetron  Assessment: 1yr female - not sure how compliant with chronic anticoagulation medications - previously on warfarin 4mg  daily with INR's approx 1.7-2.1; pltc 183-215; H/H 14.2-13.4/41-38.7 at the end of July.    Goal of Therapy:  Desire INR of 2-2.5 on warfarin.   Plan:  1. Restart warfarin 4mg  daily 2. Monitor daily INR's  Meril Dray, Shon Baton 07/16/2014,1:51 AM

## 2014-07-17 DIAGNOSIS — I4891 Unspecified atrial fibrillation: Secondary | ICD-10-CM

## 2014-07-17 LAB — PROTIME-INR
INR: 1.82 — AB (ref 0.00–1.49)
Prothrombin Time: 21.1 seconds — ABNORMAL HIGH (ref 11.6–15.2)

## 2014-07-17 LAB — BASIC METABOLIC PANEL
Anion gap: 14 (ref 5–15)
BUN: 6 mg/dL (ref 6–23)
CALCIUM: 8.3 mg/dL — AB (ref 8.4–10.5)
CO2: 33 meq/L — AB (ref 19–32)
CREATININE: 0.75 mg/dL (ref 0.50–1.10)
Chloride: 96 mEq/L (ref 96–112)
GFR calc Af Amer: >90 mL/min (ref >90)
GFR calc non Af Amer: >90 mL/min (ref >90)
GLUCOSE: 106 mg/dL — AB (ref 70–99)
Potassium: 3 mEq/L — ABNORMAL LOW (ref 3.7–5.3)
Sodium: 143 mEq/L (ref 137–147)

## 2014-07-17 LAB — PRO B NATRIURETIC PEPTIDE: Pro B Natriuretic peptide (BNP): 1105 pg/mL — ABNORMAL HIGH (ref 0–125)

## 2014-07-17 LAB — VANCOMYCIN, TROUGH: Vancomycin Tr: 16 ug/mL (ref 10.0–20.0)

## 2014-07-17 LAB — TROPONIN I: TROPONIN I: 0.43 ng/mL — AB (ref <0.30)

## 2014-07-17 MED ORDER — FUROSEMIDE 10 MG/ML IJ SOLN
40.0000 mg | Freq: Once | INTRAMUSCULAR | Status: AC
  Administered 2014-07-17: 40 mg via INTRAVENOUS
  Filled 2014-07-17: qty 4

## 2014-07-17 MED ORDER — LEVOFLOXACIN 500 MG PO TABS: 500.0000 mg | ORAL_TABLET | Freq: Every day | ORAL | Status: DC

## 2014-07-17 MED ORDER — POTASSIUM CHLORIDE CRYS ER 20 MEQ PO TBCR
40.0000 meq | EXTENDED_RELEASE_TABLET | ORAL | Status: DC
  Administered 2014-07-17: 40 meq via ORAL
  Filled 2014-07-17: qty 2

## 2014-07-17 MED ORDER — WARFARIN SODIUM 2 MG PO TABS: 4.0000 mg | ORAL_TABLET | Freq: Once | ORAL | Status: DC

## 2014-07-17 NOTE — Discharge Instructions (Signed)

## 2014-07-17 NOTE — Progress Notes (Signed)
ANTICOAGULATION CONSULT NOTE - follow up  Pharmacy Consult for warfarin Indication: chronic afib, hx of PE  Allergies  Allergen Reactions  . Nitroglycerin Other (See Comments)    CAUSED NUMBNESS ALL OVER  . Doxycycline Itching  . Flexeril [Cyclobenzaprine] Other (See Comments)    Sweating, Lightheaded   . Ibuprofen Other (See Comments)    Increase BP, dizziness, lightheaded  . Tramadol Itching  . Tylenol [Acetaminophen] Other (See Comments)    Pt has Hep C  . Vesicare [Solifenacin] Other (See Comments)    Makes my sweat turn yellow  . Amoxil [Amoxicillin] Nausea Only   Patient Measurements: Height: 5\' 10"  (177.8 cm) Weight: 210 lb (95.255 kg) IBW/kg (Calculated) : 68.5  Vital Signs: Temp: 98 F (36.7 C) (08/11 0548) Temp src: Oral (08/11 0548) BP: 110/63 mmHg (08/11 0548) Pulse Rate: 75 (08/11 0548)  Labs:  Recent Labs  07/15/14 2300 07/16/14 0004 07/16/14 0157 07/16/14 0620 07/16/14 0804 07/16/14 1345 07/17/14 0457  HGB  --   --   --  12.8  --   --   --   HCT  --   --   --  38.9  --   --   --   PLT  --   --   --  192  --   --   --   LABPROT  --  16.6*  --   --   --   --  21.1*  INR  --  1.34  --   --   --   --  1.82*  CREATININE 0.73  --   --  0.73  --   --   --   TROPONINI 0.53*  --  0.42*  --  0.55* 0.47*  --    Medical History: Past Medical History  Diagnosis Date  . COPD (chronic obstructive pulmonary disease)     Home O2 3L  . Coronary atherosclerosis of native coronary artery     Cardiac catheterization 07/2012 - LAD 40%; small D2 with 60 to 70% ostial; OM1 30 to 40%; RCA 20%; PL1 40%   . Hypothyroidism   . Hepatitis C   . Pneumonia   . Cirrhosis   . Type 2 diabetes mellitus   . GERD (gastroesophageal reflux disease)   . Headache(784.0)   . Anxiety   . Atrial fibrillation and flutter   . Chronic pain   . PE (pulmonary embolism)   . Chronic anticoagulation   . History of medication noncompliance   . SSS (sick sinus syndrome)     Biotronik  pacemaker  . Cardiomyopathy     LVEF 40-45%   Medications:  Scheduled:  . amLODipine  2.5 mg Oral Daily  . aspirin EC  81 mg Oral Daily  . atorvastatin  20 mg Oral QPC supper  . aztreonam  1 g Intravenous 3 times per day  . Chlorhexidine Gluconate Cloth  6 each Topical Q0600  . furosemide  40 mg Intravenous BID  . ipratropium-albuterol  3 mL Nebulization TID  . metoprolol succinate  25 mg Oral Daily  . mometasone-formoterol  2 puff Inhalation BID  . mupirocin ointment  1 application Nasal BID  . pantoprazole  40 mg Oral Daily  . potassium chloride SA  20 mEq Oral Daily  . QUEtiapine  50 mg Oral BID  . vancomycin  750 mg Intravenous Q8H  . Warfarin - Pharmacist Dosing Inpatient   Does not apply Q24H   Assessment: 48yo female - not sure  how compliant with chronic anticoagulation medications - previously on warfarin 4mg  daily with INR's approx 1.7-2.1; pltc 183-215; H/H 14.2-13.4/41-38.7 at the end of July.   Home dose reportedly 4mg  daily.  INR is subtherapeutic on admission but is now trending up.  Goal of Therapy:  Desire INR of 2-2.5 on warfarin.  Plan:  Coumadin 4mg  po today (home dose) INR daily  Margo Aye, [place]tingly Fountaine A 07/17/2014,10:37 AM

## 2014-07-17 NOTE — Discharge Summary (Signed)
Discharge Summary  Kim [name] PZW:[id number] DOB: [birth year]  PCP: Graylon Good, MD  Admit date: 07/15/2014 Discharge date: 07/17/2014  Time spent: 25 minutes  Recommendations for Outpatient Follow-up:  1. Patient will follow up with her PCP in the next one week 2. She will get home health physical therapy 3. She will get home health nursing to check PT/INR and help with management of her CHF 4. New medication: Levaquin 500x5 days   Discharge Diagnoses:  Active Hospital Problems   Diagnosis Date Noted  . Chest pain 07/20/2012  . Obesity 07/16/2014  . Acute on chronic diastolic heart failure 42/35/3614  . Chronic anticoagulation 05/16/2014  . Elevated troponin I level 05/16/2014  . Chronic respiratory failure 05/16/2014  . Hepatitis C 01/09/2014  . Diarrhea 07/16/2013  . Tobacco abuse 07/16/2013  . HCAP (healthcare-associated pneumonia) 02/07/2013  . COPD (chronic obstructive pulmonary disease) 07/20/2012  . Atrial fibrillation 07/20/2012    Resolved Hospital Problems   Diagnosis Date Noted Date Resolved  No resolved problems to display.    Discharge Condition: Improved, being discharged home  Diet recommendation: Heart healthy  Filed Weights   07/15/14 2115  Weight: 95.255 kg (210 lb)    History of present illness:  Patient is a 48 year old white female past medical history of COPD, atrial fibrillation on chronic anticoagulation and CHF with frequent admissions who came to the emergency room on 8/9 complaining of shortness of breath. She was found to be in CHF as well as have an infiltrate on chest x-ray. Because of her recent admission. She was treated as healthcare associated pneumonia and started on IV antibiotics plus Lasix. She is also complaining of atypical chest pain.  Hospital Course:  Principal Problem:   Chest pain enzymes x3 were mildly elevated, but no persistent peak elevation. Given her atrial fibrillation, CHF and pneumonia, this is felt to the  cause for elevated troponin  Active Problems:   COPD (chronic obstructive pulmonary disease): Stable medical issue during his hospitalization. Continue her breathing treatments and home oxygen at 2 L    Atrial fibrillation: Stable. INR noted sub-therapeutic. Pharmacy manage her Coumadin and with control doses of her home dose, her INR trended up to 1.8 by the following day    HCAP (healthcare-associated pneumonia) on IV antibiotics. By day of discharge change her to by mouth Levaquin.    Tobacco abuse: Stable. On nicotine patch.   Hepatitis C   Elevated troponin I level: See above.   Chronic anticoagulation: See above.   Acute on Chronic respiratory failure: Chronic COPD with a new pneumonia and CHF. Back to her baseline. By day of discharge is invalid in the hallway on 2 L.   Acute on chronic diastolic heart failure: Patient aggressively diuresed. She received several doses of IV Lasix and by day of discharge is am living well on her home oxygen   Obesity: Patient met criteria of BMI greater than 30   Procedures:  None  Consultations:  None  Discharge Exam: BP 110/63  Pulse 75  Temp(Src) 98 F (36.7 C) (Oral)  Resp 20  Ht _0  (1.778 m)  Wt 95.255 kg (210 lb)  BMI 30.13 kg/m2  SpO2 94%  LMP 02/08/2007  General: Alert and oriented x3, no acute distress Cardiovascular: Irregular rhythm, rate controlled Respiratory: Decreased breath sounds throughout  Discharge Instructions You were cared for by a hospitalist during your hospital stay. If you have any questions about your discharge medications or the care you received while  you were in the hospital after you are discharged, you can call the unit and asked to speak with the hospitalist on call if the hospitalist that took care of you is not available. Once you are discharged, your primary care physician will handle any further medical issues. Please note that NO REFILLS for any discharge medications will be authorized once  you are discharged, as it is imperative that you return to your primary care physician (or establish a relationship with a primary care physician if you do not have one) for your aftercare needs so that they can reassess your need for medications and monitor your lab values.  Discharge Instructions   Diet - low sodium heart healthy    Complete by:  As directed      Increase activity slowly    Complete by:  As directed             Medication List         albuterol 108 (90 BASE) MCG/ACT inhaler  Commonly known as:  PROVENTIL HFA;VENTOLIN HFA  Inhale 2 puffs into the lungs every 6 (six) hours as needed for wheezing or shortness of breath.     amLODipine 2.5 MG tablet  Commonly known as:  NORVASC  Take 1 tablet (2.5 mg total) by mouth daily.     aspirin EC 81 MG tablet  Take 1 tablet (81 mg total) by mouth daily.     atorvastatin 20 MG tablet  Commonly known as:  LIPITOR  Take 20 mg by mouth daily.     budesonide-formoterol 80-4.5 MCG/ACT inhaler  Commonly known as:  SYMBICORT  Inhale 2 puffs into the lungs 2 (two) times daily.     furosemide 80 MG tablet  Commonly known as:  LASIX  Take 1 tablet (80 mg total) by mouth 2 (two) times daily.     levofloxacin 500 MG tablet  Commonly known as:  LEVAQUIN  Take 1 tablet (500 mg total) by mouth daily.     metoprolol succinate 25 MG 24 hr tablet  Commonly known as:  TOPROL-XL  Take 1 tablet (25 mg total) by mouth daily.     pantoprazole 40 MG tablet  Commonly known as:  PROTONIX  Take 40 mg by mouth daily.     potassium chloride SA 20 MEQ tablet  Commonly known as:  K-DUR,KLOR-CON  Take 1 tablet (20 mEq total) by mouth daily.     QUEtiapine 25 MG tablet  Commonly known as:  SEROQUEL  Take 50 mg by mouth 2 (two) times daily.     tiotropium 18 MCG inhalation capsule  Commonly known as:  SPIRIVA  Place 18 mcg into inhaler and inhale daily.     warfarin 4 MG tablet  Commonly known as:  COUMADIN  Take 4 mg by mouth  daily.       Allergies  Allergen Reactions  . Nitroglycerin Other (See Comments)    CAUSED NUMBNESS ALL OVER  . Doxycycline Itching  . Flexeril [Cyclobenzaprine] Other (See Comments)    Sweating, Lightheaded   . Ibuprofen Other (See Comments)    Increase BP, dizziness, lightheaded  . Tramadol Itching  . Tylenol [Acetaminophen] Other (See Comments)    Pt has Hep C  . Vesicare [Solifenacin] Other (See Comments)    Makes my sweat turn yellow  . Amoxil [Amoxicillin] Nausea Only       Follow-up Information   Follow up with Graylon Good, MD In 1 week.   Specialty:  Family Medicine   Contact information:   [address] [place] [address] [phone]       Follow up with [facility].   Contact information:   [address] [place] [address] [phone]        The results of significant diagnostics from this hospitalization (including imaging, microbiology, ancillary and laboratory) are listed below for reference.    Significant Diagnostic Studies: Dg Chest 2 View  07/15/2014    IMPRESSION: 1. Mildly loculated small right pleural effusion, with right basilar airspace opacification. This likely reflects pneumonia. Would complete treatment for pneumonia, then perform follow-up chest radiograph to ensure resolution of airspace opacities 2. Cardiomegaly.   Electronically Signed   By: Garald Balding M.D.   On: 07/15/2014 22:31    Microbiology: Recent Results (from the past 240 hour(s))  CULTURE, BLOOD (ROUTINE X 2)     Status: None   Collection Time    07/16/14  1:57 AM      Result Value Ref Range Status   Specimen Description BLOOD LEFT ANTECUBITAL   Final   Special Requests BOTTLES DRAWN AEROBIC AND ANAEROBIC [place]   Final   Culture NO GROWTH 1 DAY   Final   Report Status PENDING   Incomplete  CULTURE, BLOOD (ROUTINE X 2)     Status: None   Collection Time    07/16/14  1:59 AM      Result Value Ref Range Status   Specimen Description  BLOOD LEFT HAND   Final   Special Requests BOTTLES DRAWN AEROBIC AND ANAEROBIC [place]   Final   Culture NO GROWTH 1 DAY   Final   Report Status PENDING   Incomplete  MRSA PCR SCREENING     Status: Abnormal   Collection Time    07/16/14 12:45 PM      Result Value Ref Range Status   MRSA by PCR POSITIVE (*) NEGATIVE Final   Comment:            The GeneXpert MRSA Assay (FDA     approved for NASAL specimens     only), is one component of a     comprehensive MRSA colonization     surveillance program. It is not     intended to diagnose MRSA     infection nor to guide or     monitor treatment for     MRSA infections.     RESULT CALLED TO, READ BACK BY AND VERIFIED WITH:     MCDANIEL,M. AT 1833 ON 07/16/2014 BY BAUGHAM,M.     Labs: Basic Metabolic Panel:  Recent Labs Lab 07/15/14 2300 07/16/14 0620 07/17/14 1126  NA 140 141 143  K 3.6* 3.1* 3.0*  CL 100 100 96  CO2 30 29 33*  GLUCOSE 106* 159* 106*  BUN _0 CREATININE 0.73 0.73 0.75  CALCIUM 8.6 8.0* 8.3*   Liver Function Tests:  Recent Labs Lab 07/15/14 2300 07/16/14 0620  AST 32 29  ALT 14 14  ALKPHOS 80 82  BILITOT 0.8 0.7  PROT 7.2 6.9  ALBUMIN 3.4* 3.3*    Recent Labs Lab 07/15/14 2300  LIPASE 30   No results found for this basename: AMMONIA,  in the last 168 hours CBC:  Recent Labs Lab 07/16/14 0620  WBC 7.4  HGB 12.8  HCT 38.9  MCV 105.4*  PLT 192   Cardiac Enzymes:  Recent Labs Lab 07/15/14 2300 07/16/14 0157 07/16/14 0804 07/16/14 1345 07/17/14  1126  TROPONINI 0.53* 0.42* 0.55* 0.47* 0.43*   BNP: BNP (last 3 results)  Recent Labs  06/28/14 2325 07/15/14 2300 07/17/14 1126  PROBNP 1712.0* 2008.0* 1105.0*   CBG: No results found for this basename: GLUCAP,  in the last 168 hours     Signed:  Annita Brod  [name] 07/17/2014, 4:34 PM

## 2014-07-17 NOTE — Clinical Documentation Improvement (Addendum)
"  Patient has history of COPD and is on home oxygen" and "COPD" per H&P  Home Medications include: Symbicort;  Spiriva;  Proventil prn  Current Hospital Medications include:  Oxygen;  Duonebs;  Dulera  Possible Clinical  Conditions:   - Chronic Respiratory Failure   - Other Condition   - Unable to Clinically Determine  Thank You, Jerral Ralph ,RN BSN CCDS Certified Clinical Documentation Specialist:  [phone] [facility]- Health Information Management

## 2014-07-17 NOTE — Progress Notes (Signed)
ANTIBIOTIC CONSULT NOTE - follow up  Pharmacy Consult for vancomycin Indication: HCAP (?)  Allergies  Allergen Reactions  . Nitroglycerin Other (See Comments)    CAUSED NUMBNESS ALL OVER  . Doxycycline Itching  . Flexeril [Cyclobenzaprine] Other (See Comments)    Sweating, Lightheaded   . Ibuprofen Other (See Comments)    Increase BP, dizziness, lightheaded  . Tramadol Itching  . Tylenol [Acetaminophen] Other (See Comments)    Pt has Hep C  . Vesicare [Solifenacin] Other (See Comments)    Makes my sweat turn yellow  . Amoxil [Amoxicillin] Nausea Only   Patient Measurements: Height: 5\' 10"  (177.8 cm) Weight: 210 lb (95.255 kg) IBW/kg (Calculated) : 68.5  Vital Signs: Temp: 98 F (36.7 C) (08/11 0548) Temp src: Oral (08/11 0548) BP: 110/63 mmHg (08/11 0548) Pulse Rate: 75 (08/11 0548) Intake/Output from previous day: 08/10 0701 - 08/11 0700 In: 200 [IV Piggyback:200] Out: -  Intake/Output from this shift:    Labs:  Recent Labs  07/15/14 2300 07/16/14 0620  WBC  --  7.4  HGB  --  12.8  PLT  --  192  CREATININE 0.73 0.73   Estimated Creatinine Clearance: 106.4 ml/min (by C-G formula based on Cr of 0.73).  Recent Labs  07/17/14 0907  VANCOTROUGH 16.0    Microbiology: Recent Results (from the past 720 hour(s))  URINE CULTURE     Status: None   Collection Time    06/24/14  4:23 PM      Result Value Ref Range Status   Specimen Description URINE, CLEAN CATCH   Final   Special Requests NONE   Final   Culture  Setup Time     Final   Value: 06/25/2014 14:50     Performed at [facility] Count     Final   Value: NO GROWTH     Performed at [facility]   Culture     Final   Value: NO GROWTH     Performed at [facility]   Report Status 06/26/2014 FINAL   Final  CULTURE, BLOOD (ROUTINE X 2)     Status: None   Collection Time    07/16/14  1:57 AM      Result Value Ref Range Status   Specimen Description Blood LEFT  ANTECUBITAL   Final   Special Requests BOTTLES DRAWN AEROBIC AND ANAEROBIC 7CC   Final   Culture NO GROWTH <24 HRS   Final   Report Status PENDING   Incomplete  CULTURE, BLOOD (ROUTINE X 2)     Status: None   Collection Time    07/16/14  1:59 AM      Result Value Ref Range Status   Specimen Description Blood BLOOD LEFT HAND   Final   Special Requests BOTTLES DRAWN AEROBIC AND ANAEROBIC 7CC   Final   Culture NO GROWTH <24 HRS   Final   Report Status PENDING   Incomplete  MRSA PCR SCREENING     Status: Abnormal   Collection Time    07/16/14 12:45 PM      Result Value Ref Range Status   MRSA by PCR POSITIVE (*) NEGATIVE Final   Comment:            The GeneXpert MRSA Assay (FDA     approved for NASAL specimens     only), is one component of a     comprehensive MRSA colonization     surveillance program. It  is not     intended to diagnose MRSA     infection nor to guide or     monitor treatment for     MRSA infections.     RESULT CALLED TO, READ BACK BY AND VERIFIED WITH:     MCDANIEL,M. AT 1833 ON 07/16/2014 BY Ginette PitmanBAUGHAM,M.   Medical History: Past Medical History  Diagnosis Date  . COPD (chronic obstructive pulmonary disease)     Home O2 3L  . Coronary atherosclerosis of native coronary artery     Cardiac catheterization 07/2012 - LAD 40%; small D2 with 60 to 70% ostial; OM1 30 to 40%; RCA 20%; PL1 40%   . Hypothyroidism   . Hepatitis C   . Pneumonia   . Cirrhosis   . Type 2 diabetes mellitus   . GERD (gastroesophageal reflux disease)   . Headache(784.0)   . Anxiety   . Atrial fibrillation and flutter   . Chronic pain   . PE (pulmonary embolism)   . Chronic anticoagulation   . History of medication noncompliance   . SSS (sick sinus syndrome)     Biotronik pacemaker  . Cardiomyopathy     LVEF 40-45%   Medications:  Scheduled:  . amLODipine  2.5 mg Oral Daily  . aspirin EC  81 mg Oral Daily  . atorvastatin  20 mg Oral QPC supper  . aztreonam  1 g Intravenous 3 times  per day  . Chlorhexidine Gluconate Cloth  6 each Topical Q0600  . furosemide  40 mg Intravenous BID  . ipratropium-albuterol  3 mL Nebulization TID  . metoprolol succinate  25 mg Oral Daily  . mometasone-formoterol  2 puff Inhalation BID  . mupirocin ointment  1 application Nasal BID  . pantoprazole  40 mg Oral Daily  . potassium chloride SA  20 mEq Oral Daily  . QUEtiapine  50 mg Oral BID  . vancomycin  750 mg Intravenous Q8H  . Warfarin - Pharmacist Dosing Inpatient   Does not apply Q24H   Vancomycin 8/10>> Aztreonam 8/10>>  Assessment: 89yr female with complicated medical hx (COPD, hx of PE cardioyopathy, implanted pacemaker, cirrhosis, Hep C, DM, hypothyroidism) - now with clinical sx's of pneumonia.  Patient started on Azactam & Vancomycin.  Trough level is on target.  Goal of Therapy:  Desire vancomycin serum trough to be 15-3120mcg/ml  Plan:  1.  Continue Vancomycin 750mg  IV q8h 2.  Continue Aztreonam per MD 3.  Monitor labs, renal fxn, and cultures 4.  Get Vancomycin trough level weekly or sooner if indicated 5.  Duration of therapy per MD / Deescalate abx when appropriate  Valrie HartHall, Ayodele Sangalang A 07/17/2014,10:32 AM

## 2014-07-17 NOTE — Progress Notes (Signed)
Patient with orders to be discharged home. Discharge instructions given, patient verbalized understanding. Prescriptions given. Patient stable. Patient left in private vehicle with family.   

## 2014-07-21 ENCOUNTER — Emergency Department (HOSPITAL_COMMUNITY): Payer: Medicaid Other

## 2014-07-21 ENCOUNTER — Encounter (HOSPITAL_COMMUNITY): Payer: Self-pay | Admitting: Emergency Medicine

## 2014-07-21 ENCOUNTER — Inpatient Hospital Stay (HOSPITAL_COMMUNITY)
Admission: EM | Admit: 2014-07-21 | Discharge: 2014-07-24 | DRG: 190 | Disposition: A | Discharge status: Home Health | Payer: Medicaid Other | Attending: Internal Medicine | Admitting: Internal Medicine

## 2014-07-21 DIAGNOSIS — K219 Gastro-esophageal reflux disease without esophagitis: Secondary | ICD-10-CM | POA: Diagnosis present

## 2014-07-21 DIAGNOSIS — I4891 Unspecified atrial fibrillation: Secondary | ICD-10-CM | POA: Diagnosis present

## 2014-07-21 DIAGNOSIS — Z7901 Long term (current) use of anticoagulants: Secondary | ICD-10-CM

## 2014-07-21 DIAGNOSIS — J90 Pleural effusion, not elsewhere classified: Secondary | ICD-10-CM | POA: Diagnosis present

## 2014-07-21 DIAGNOSIS — E119 Type 2 diabetes mellitus without complications: Secondary | ICD-10-CM | POA: Diagnosis present

## 2014-07-21 DIAGNOSIS — Z8249 Family history of ischemic heart disease and other diseases of the circulatory system: Secondary | ICD-10-CM

## 2014-07-21 DIAGNOSIS — R778 Other specified abnormalities of plasma proteins: Secondary | ICD-10-CM

## 2014-07-21 DIAGNOSIS — I509 Heart failure, unspecified: Secondary | ICD-10-CM | POA: Diagnosis present

## 2014-07-21 DIAGNOSIS — E039 Hypothyroidism, unspecified: Secondary | ICD-10-CM | POA: Diagnosis present

## 2014-07-21 DIAGNOSIS — R079 Chest pain, unspecified: Secondary | ICD-10-CM

## 2014-07-21 DIAGNOSIS — J9611 Chronic respiratory failure with hypoxia: Secondary | ICD-10-CM

## 2014-07-21 DIAGNOSIS — I5033 Acute on chronic diastolic (congestive) heart failure: Secondary | ICD-10-CM

## 2014-07-21 DIAGNOSIS — B372 Candidiasis of skin and nail: Secondary | ICD-10-CM | POA: Diagnosis present

## 2014-07-21 DIAGNOSIS — K7460 Unspecified cirrhosis of liver: Secondary | ICD-10-CM | POA: Diagnosis present

## 2014-07-21 DIAGNOSIS — Z886 Allergy status to analgesic agent status: Secondary | ICD-10-CM

## 2014-07-21 DIAGNOSIS — M549 Dorsalgia, unspecified: Secondary | ICD-10-CM | POA: Diagnosis present

## 2014-07-21 DIAGNOSIS — J9610 Chronic respiratory failure, unspecified whether with hypoxia or hypercapnia: Secondary | ICD-10-CM | POA: Diagnosis present

## 2014-07-21 DIAGNOSIS — Z9981 Dependence on supplemental oxygen: Secondary | ICD-10-CM

## 2014-07-21 DIAGNOSIS — J441 Chronic obstructive pulmonary disease with (acute) exacerbation: Secondary | ICD-10-CM | POA: Diagnosis present

## 2014-07-21 DIAGNOSIS — I2510 Atherosclerotic heart disease of native coronary artery without angina pectoris: Secondary | ICD-10-CM | POA: Diagnosis present

## 2014-07-21 DIAGNOSIS — G8929 Other chronic pain: Secondary | ICD-10-CM | POA: Diagnosis present

## 2014-07-21 DIAGNOSIS — R7989 Other specified abnormal findings of blood chemistry: Secondary | ICD-10-CM

## 2014-07-21 DIAGNOSIS — I428 Other cardiomyopathies: Secondary | ICD-10-CM | POA: Diagnosis present

## 2014-07-21 DIAGNOSIS — Z91199 Patient's noncompliance with other medical treatment and regimen due to unspecified reason: Secondary | ICD-10-CM

## 2014-07-21 DIAGNOSIS — I5023 Acute on chronic systolic (congestive) heart failure: Secondary | ICD-10-CM | POA: Diagnosis present

## 2014-07-21 DIAGNOSIS — Z9119 Patient's noncompliance with other medical treatment and regimen: Secondary | ICD-10-CM

## 2014-07-21 DIAGNOSIS — Z86711 Personal history of pulmonary embolism: Secondary | ICD-10-CM

## 2014-07-21 DIAGNOSIS — Z950 Presence of cardiac pacemaker: Secondary | ICD-10-CM

## 2014-07-21 DIAGNOSIS — F17200 Nicotine dependence, unspecified, uncomplicated: Secondary | ICD-10-CM | POA: Diagnosis present

## 2014-07-21 LAB — TROPONIN I: Troponin I: 0.44 ng/mL (ref <0.30)

## 2014-07-21 LAB — CBC
HEMATOCRIT: 39.9 % (ref 36.0–46.0)
HEMOGLOBIN: 13 g/dL (ref 12.0–15.0)
MCH: 34.4 pg — AB (ref 26.0–34.0)
MCHC: 32.6 g/dL (ref 30.0–36.0)
MCV: 105.6 fL — ABNORMAL HIGH (ref 78.0–100.0)
Platelets: 214 10*3/uL (ref 150–400)
RBC: 3.78 MIL/uL — ABNORMAL LOW (ref 3.87–5.11)
RDW: 14.9 % (ref 11.5–15.5)
WBC: 8.1 10*3/uL (ref 4.0–10.5)

## 2014-07-21 LAB — COMPREHENSIVE METABOLIC PANEL
ALK PHOS: 73 U/L (ref 39–117)
ALT: 16 U/L (ref 0–35)
AST: 42 U/L — ABNORMAL HIGH (ref 0–37)
Albumin: 3.6 g/dL (ref 3.5–5.2)
Anion gap: 11 (ref 5–15)
BUN: 7 mg/dL (ref 6–23)
CO2: 31 meq/L (ref 19–32)
Calcium: 9 mg/dL (ref 8.4–10.5)
Chloride: 95 mEq/L — ABNORMAL LOW (ref 96–112)
Creatinine, Ser: 0.67 mg/dL (ref 0.50–1.10)
GLUCOSE: 88 mg/dL (ref 70–99)
POTASSIUM: 4.4 meq/L (ref 3.7–5.3)
Sodium: 137 mEq/L (ref 137–147)
TOTAL PROTEIN: 7.3 g/dL (ref 6.0–8.3)
Total Bilirubin: 1.4 mg/dL — ABNORMAL HIGH (ref 0.3–1.2)

## 2014-07-21 LAB — CULTURE, BLOOD (ROUTINE X 2)
Culture: NO GROWTH
Culture: NO GROWTH

## 2014-07-21 LAB — PROTIME-INR
INR: 1.19 (ref 0.00–1.49)
PROTHROMBIN TIME: 15.1 s (ref 11.6–15.2)

## 2014-07-21 LAB — MAGNESIUM: Magnesium: 1.7 mg/dL (ref 1.5–2.5)

## 2014-07-21 LAB — PRO B NATRIURETIC PEPTIDE: Pro B Natriuretic peptide (BNP): 2436 pg/mL — ABNORMAL HIGH (ref 0–125)

## 2014-07-21 MED ORDER — METHYLPREDNISOLONE SODIUM SUCC 125 MG IJ SOLR
125.0000 mg | Freq: Once | INTRAMUSCULAR | Status: AC
  Administered 2014-07-21: 125 mg via INTRAVENOUS
  Filled 2014-07-21: qty 2

## 2014-07-21 MED ORDER — IPRATROPIUM BROMIDE 0.02 % IN SOLN
0.5000 mg | Freq: Once | RESPIRATORY_TRACT | Status: AC
  Administered 2014-07-21: 0.5 mg via RESPIRATORY_TRACT
  Filled 2014-07-21: qty 2.5

## 2014-07-21 MED ORDER — ALBUTEROL SULFATE (2.5 MG/3ML) 0.083% IN NEBU
5.0000 mg | INHALATION_SOLUTION | RESPIRATORY_TRACT | Status: DC | PRN
  Administered 2014-07-21 (×2): 5 mg via RESPIRATORY_TRACT
  Filled 2014-07-21 (×2): qty 6

## 2014-07-21 MED ORDER — MORPHINE SULFATE 4 MG/ML IJ SOLN
4.0000 mg | Freq: Once | INTRAMUSCULAR | Status: AC
  Administered 2014-07-21: 4 mg via INTRAVENOUS
  Filled 2014-07-21: qty 1

## 2014-07-21 MED ORDER — ONDANSETRON HCL 4 MG/2ML IJ SOLN
4.0000 mg | Freq: Once | INTRAMUSCULAR | Status: AC
  Administered 2014-07-21: 4 mg via INTRAVENOUS
  Filled 2014-07-21: qty 2

## 2014-07-21 MED ORDER — ASPIRIN 81 MG PO CHEW: 324.0000 mg | CHEWABLE_TABLET | Freq: Once | ORAL | Status: DC

## 2014-07-21 NOTE — ED Notes (Signed)
CRITICAL VALUE ALERT  Critical value received: trop 0.44 Date of notification: 07/21/14 Time of notification:  2152 Critical value read back:yes  Nurse who received alert: smoore rn MD notified (1st page): dr Lynelle Doctor Time of first page: 2152 MD notified (2nd page):  Time of second page:  Responding MD: dr Linwood Dibbles Time MD respond: [id number]

## 2014-07-21 NOTE — ED Notes (Signed)
Complain of pain in her chest with movement and when coughing. Pt was recently discharged from the hospital for pneumonia

## 2014-07-21 NOTE — ED Provider Notes (Signed)
CSN: [id number]     Arrival date & time 07/21/14  1829 History   This chart was scribed for Linwood Dibbles, MD by Modena Jansky, ED Scribe. This patient was seen in room APA16A/APA16A and the patient's care was started at 6:47 PM.   Chief Complaint  Patient presents with  . Shortness of Breath   The history is provided by the patient. No language interpreter was used.   HPI Comments: Kim [name] is a 48 y.o. female with a hx of COPD and CHF who presents to the Emergency Department complaining of constant moderate substernal chest pain that started last night. Pt reports that she got out of the shower last night and noticed that her breathing was abnormal and was having SOB and chest pain. She also reports associated back pain. She states that the pain is worse when coughs. She describes the pain as a sharp sensation. She reports that she uses a breathing treatment at home. She states that she has a hx of smoking, but hasn't smoked recently.   Past Medical History  Diagnosis Date  . COPD (chronic obstructive pulmonary disease)     Home O2 3L  . Coronary atherosclerosis of native coronary artery     Cardiac catheterization 07/2012 - LAD 40%; small D2 with 60 to 70% ostial; OM1 30 to 40%; RCA 20%; PL1 40%   . Hypothyroidism   . Hepatitis C   . Pneumonia   . Cirrhosis   . Type 2 diabetes mellitus   . GERD (gastroesophageal reflux disease)   . Headache(784.0)   . Anxiety   . Atrial fibrillation and flutter   . Chronic pain   . PE (pulmonary embolism)   . Chronic anticoagulation   . History of medication noncompliance   . SSS (sick sinus syndrome)     Biotronik pacemaker  . Cardiomyopathy     LVEF 40-45%   Past Surgical History  Procedure Laterality Date  . Cholecystectomy    . Pacemaker insertion  02/2012    Biotronik PPM   . Cardiac electrophysiology study and ablation    . Esophagogastroduodenoscopy  Jan 2010    Dr. Arlyce Dice: multiple gastric erosions, benign path  .  Esophagogastroduodenoscopy N/A 01/10/2014    HER:[id number] EG junction. Query short segment Barrett's esophagus-status post pinch biopsy as described above. Hiatal hernia; otherwise negative EGD   Family History  Problem Relation Age of Onset  . Coronary artery disease Father 58  . Coronary artery disease Mother 24  . Coronary artery disease Brother   . Colon cancer Neg Hx    History  Substance Use Topics  . Smoking status: Current Every Day Smoker -- 0.50 packs/day for 30 years    Types: Cigarettes  . Smokeless tobacco: Current User    Types: Snuff  . Alcohol Use: No   OB History   Grav Para Term Preterm Abortions TAB SAB Ect Mult Living                 Review of Systems  Respiratory: Positive for cough and shortness of breath.   Cardiovascular: Positive for chest pain.  All other systems reviewed and are negative.   Allergies  Nitroglycerin; Doxycycline; Flexeril; Ibuprofen; Tramadol; Tylenol; Vesicare; and Amoxil  Home Medications   Prior to Admission medications   Medication Sig Start Date End Date Taking? Authorizing Provider  albuterol (PROVENTIL HFA;VENTOLIN HFA) 108 (90 BASE) MCG/ACT inhaler Inhale 2 puffs into the lungs every 6 (six) hours as needed for wheezing  or shortness of breath.    Yes Historical Provider, MD  amLODipine (NORVASC) 2.5 MG tablet Take 1 tablet (2.5 mg total) by mouth daily. 07/01/14  Yes Standley Brookinganiel P Goodrich, MD  aspirin EC 81 MG tablet Take 1 tablet (81 mg total) by mouth daily. 07/01/14  Yes Standley Brookinganiel P Goodrich, MD  atorvastatin (LIPITOR) 20 MG tablet Take 20 mg by mouth daily.   Yes Historical Provider, MD  budesonide-formoterol (SYMBICORT) 80-4.5 MCG/ACT inhaler Inhale 2 puffs into the lungs 2 (two) times daily.   Yes Historical Provider, MD  furosemide (LASIX) 80 MG tablet Take 1 tablet (80 mg total) by mouth 2 (two) times daily. 06/14/14  Yes Standley Brookinganiel P Goodrich, MD  levofloxacin (LEVAQUIN) 500 MG tablet Take 1 tablet (500 mg total) by mouth daily.  07/17/14 07/22/14 Yes Hollice EspySendil K Krishnan, MD  metoprolol succinate (TOPROL-XL) 25 MG 24 hr tablet Take 1 tablet (25 mg total) by mouth daily. 06/14/14  Yes Lesle ChrisKaren M Black, NP  pantoprazole (PROTONIX) 40 MG tablet Take 40 mg by mouth daily.   Yes Historical Provider, MD  potassium chloride SA (K-DUR,KLOR-CON) 20 MEQ tablet Take 1 tablet (20 mEq total) by mouth daily. 06/07/14  Yes Lyanne CoKevin M Campos, MD  QUEtiapine (SEROQUEL) 25 MG tablet Take 50 mg by mouth 2 (two) times daily. 12/08/13  Yes Historical Provider, MD  tiotropium (SPIRIVA) 18 MCG inhalation capsule Place 18 mcg into inhaler and inhale daily.   Yes Historical Provider, MD  warfarin (COUMADIN) 4 MG tablet Take 4 mg by mouth daily.   Yes Historical Provider, MD   BP 166/97  Pulse 80  Temp(Src) 97.9 F (36.6 C) (Oral)  Resp 24  Ht 5\' 9"  (1.753 m)  Wt 210 lb (95.255 kg)  BMI 31.00 kg/m2  SpO2 100%  LMP 02/08/2007 Physical Exam  Nursing note and vitals reviewed. Constitutional: No distress.  Obese.   HENT:  Head: Normocephalic and atraumatic.  Right Ear: External ear normal.  Left Ear: External ear normal.  Eyes: Conjunctivae are normal. Right eye exhibits no discharge. Left eye exhibits no discharge. No scleral icterus.  Neck: Neck supple. No tracheal deviation present.  Cardiovascular: Normal rate, regular rhythm and intact distal pulses.   Pulmonary/Chest: No stridor. Tachypnea noted. No respiratory distress. She has wheezes in the right upper field, the right middle field, the right lower field, the left upper field, the left middle field and the left lower field. She has no rales.  Abdominal: Soft. Bowel sounds are normal. She exhibits no distension. There is no tenderness. There is no rebound and no guarding.  Musculoskeletal: She exhibits no edema and no tenderness.  Neurological: She is alert. She has normal strength. No cranial nerve deficit (no facial droop, extraocular movements intact, no slurred speech) or sensory deficit. She  exhibits normal muscle tone. She displays no seizure activity. Coordination normal.  Skin: Skin is warm and dry. No rash noted.  Psychiatric: She has a normal mood and affect.    ED Course  Procedures (including critical care time) DIAGNOSTIC STUDIES: Oxygen Saturation is 100% on RA, normal by my interpretation.    COORDINATION OF CARE: 6:51 PM- Pt advised of plan for treatment which includes medication, radiology, and labs and pt agrees.  Labs Review Labs Reviewed  CBC - Abnormal; Notable for the following:    RBC 3.78 (*)    MCV 105.6 (*)    MCH 34.4 (*)    All other components within normal limits  COMPREHENSIVE METABOLIC PANEL -  Abnormal; Notable for the following:    Chloride 95 (*)    AST 42 (*)    Total Bilirubin 1.4 (*)    All other components within normal limits  PRO B NATRIURETIC PEPTIDE - Abnormal; Notable for the following:    Pro B Natriuretic peptide (BNP) 2436.0 (*)    All other components within normal limits  TROPONIN I - Abnormal; Notable for the following:    Troponin I 0.44 (*)    All other components within normal limits  MAGNESIUM  PROTIME-INR    Imaging Review Dg Chest 2 View  07/21/2014   CLINICAL DATA:  Shortness of breath and hemoptysis.  EXAM: CHEST  2 VIEW  COMPARISON:  PA and lateral chest 07/15/2014.  CT chest 03/10/2014.  FINDINGS: Right effusion and airspace disease persist without marked change. The left lung is clear. Heart size is upper normal. No pneumothorax. Pacing device is in place.  IMPRESSION: No marked change in a right pleural effusion and airspace disease since most recent examination.   Electronically Signed   By: Drusilla Kanner M.D.   On: 07/21/2014 21:02   Medications  albuterol (PROVENTIL) (2.5 MG/3ML) 0.083% nebulizer solution 5 mg (5 mg Nebulization Given 07/21/14 2156)  aspirin chewable tablet 324 mg (324 mg Oral Not Given 07/21/14 2159)  ipratropium (ATROVENT) nebulizer solution 0.5 mg (0.5 mg Nebulization Given 07/21/14  1923)  methylPREDNISolone sodium succinate (SOLU-MEDROL) 125 mg/2 mL injection 125 mg (125 mg Intravenous Given 07/21/14 2025)     EKG Electronically paced rhythm, rate 79 No prior EKG for comparison MDM   Final diagnoses:  COPD exacerbation  Chest pain, unspecified chest pain type  Elevated troponin   Pt has history of COPD, continues cigarette smoking and history of cardiac disease.  She has had numerous visits to the hospital for similar symptoms.  Pt continues to wheeze in the ED despite treatments.  Troponin is elevated at 0.44 but reviewing her old labs, this is chronically elevated.  Pt states she is allergic to asa and ntg.  Refuses those medications.  Doubt cardiac ischemia at this time.  Will consult with medical service regarding admission for COPD exacerbation.   I personally performed the services described in this documentation, which was scribed in my presence.  The recorded information has been reviewed and is accurate.     Linwood Dibbles, MD 07/21/14 [phone]

## 2014-07-22 ENCOUNTER — Encounter (HOSPITAL_COMMUNITY): Payer: Self-pay | Admitting: Emergency Medicine

## 2014-07-22 DIAGNOSIS — I428 Other cardiomyopathies: Secondary | ICD-10-CM | POA: Diagnosis present

## 2014-07-22 DIAGNOSIS — B372 Candidiasis of skin and nail: Secondary | ICD-10-CM | POA: Diagnosis present

## 2014-07-22 DIAGNOSIS — R0602 Shortness of breath: Secondary | ICD-10-CM | POA: Diagnosis present

## 2014-07-22 DIAGNOSIS — Z8249 Family history of ischemic heart disease and other diseases of the circulatory system: Secondary | ICD-10-CM | POA: Diagnosis not present

## 2014-07-22 DIAGNOSIS — I4891 Unspecified atrial fibrillation: Secondary | ICD-10-CM

## 2014-07-22 DIAGNOSIS — Z91199 Patient's noncompliance with other medical treatment and regimen due to unspecified reason: Secondary | ICD-10-CM | POA: Diagnosis not present

## 2014-07-22 DIAGNOSIS — I509 Heart failure, unspecified: Secondary | ICD-10-CM

## 2014-07-22 DIAGNOSIS — Z7901 Long term (current) use of anticoagulants: Secondary | ICD-10-CM | POA: Diagnosis not present

## 2014-07-22 DIAGNOSIS — K219 Gastro-esophageal reflux disease without esophagitis: Secondary | ICD-10-CM | POA: Diagnosis present

## 2014-07-22 DIAGNOSIS — Z9981 Dependence on supplemental oxygen: Secondary | ICD-10-CM | POA: Diagnosis not present

## 2014-07-22 DIAGNOSIS — I5023 Acute on chronic systolic (congestive) heart failure: Secondary | ICD-10-CM | POA: Diagnosis present

## 2014-07-22 DIAGNOSIS — J9610 Chronic respiratory failure, unspecified whether with hypoxia or hypercapnia: Secondary | ICD-10-CM | POA: Diagnosis present

## 2014-07-22 DIAGNOSIS — Z950 Presence of cardiac pacemaker: Secondary | ICD-10-CM | POA: Diagnosis not present

## 2014-07-22 DIAGNOSIS — F17200 Nicotine dependence, unspecified, uncomplicated: Secondary | ICD-10-CM | POA: Diagnosis present

## 2014-07-22 DIAGNOSIS — G8929 Other chronic pain: Secondary | ICD-10-CM | POA: Diagnosis present

## 2014-07-22 DIAGNOSIS — Z86711 Personal history of pulmonary embolism: Secondary | ICD-10-CM | POA: Diagnosis not present

## 2014-07-22 DIAGNOSIS — Z886 Allergy status to analgesic agent status: Secondary | ICD-10-CM | POA: Diagnosis not present

## 2014-07-22 DIAGNOSIS — J441 Chronic obstructive pulmonary disease with (acute) exacerbation: Principal | ICD-10-CM

## 2014-07-22 DIAGNOSIS — J90 Pleural effusion, not elsewhere classified: Secondary | ICD-10-CM | POA: Diagnosis present

## 2014-07-22 DIAGNOSIS — I5033 Acute on chronic diastolic (congestive) heart failure: Secondary | ICD-10-CM

## 2014-07-22 DIAGNOSIS — R7989 Other specified abnormal findings of blood chemistry: Secondary | ICD-10-CM

## 2014-07-22 DIAGNOSIS — I2510 Atherosclerotic heart disease of native coronary artery without angina pectoris: Secondary | ICD-10-CM | POA: Diagnosis present

## 2014-07-22 DIAGNOSIS — E039 Hypothyroidism, unspecified: Secondary | ICD-10-CM | POA: Diagnosis present

## 2014-07-22 DIAGNOSIS — K7460 Unspecified cirrhosis of liver: Secondary | ICD-10-CM | POA: Diagnosis present

## 2014-07-22 DIAGNOSIS — E119 Type 2 diabetes mellitus without complications: Secondary | ICD-10-CM | POA: Diagnosis present

## 2014-07-22 DIAGNOSIS — M549 Dorsalgia, unspecified: Secondary | ICD-10-CM | POA: Diagnosis present

## 2014-07-22 LAB — CBC
HCT: 38.3 % (ref 36.0–46.0)
Hemoglobin: 12.7 g/dL (ref 12.0–15.0)
MCH: 34.6 pg — ABNORMAL HIGH (ref 26.0–34.0)
MCHC: 33.2 g/dL (ref 30.0–36.0)
MCV: 104.4 fL — ABNORMAL HIGH (ref 78.0–100.0)
Platelets: 179 10*3/uL (ref 150–400)
RBC: 3.67 MIL/uL — AB (ref 3.87–5.11)
RDW: 14.6 % (ref 11.5–15.5)
WBC: 6.1 10*3/uL (ref 4.0–10.5)

## 2014-07-22 LAB — COMPREHENSIVE METABOLIC PANEL
ALK PHOS: 69 U/L (ref 39–117)
ALT: 16 U/L (ref 0–35)
AST: 44 U/L — ABNORMAL HIGH (ref 0–37)
Albumin: 3.4 g/dL — ABNORMAL LOW (ref 3.5–5.2)
Anion gap: 15 (ref 5–15)
BUN: 8 mg/dL (ref 6–23)
CALCIUM: 8.8 mg/dL (ref 8.4–10.5)
CO2: 25 meq/L (ref 19–32)
Chloride: 95 mEq/L — ABNORMAL LOW (ref 96–112)
Creatinine, Ser: 0.59 mg/dL (ref 0.50–1.10)
GLUCOSE: 204 mg/dL — AB (ref 70–99)
Potassium: 4.3 mEq/L (ref 3.7–5.3)
Sodium: 135 mEq/L — ABNORMAL LOW (ref 137–147)
Total Bilirubin: 1.1 mg/dL (ref 0.3–1.2)
Total Protein: 7.2 g/dL (ref 6.0–8.3)

## 2014-07-22 LAB — URINE MICROSCOPIC-ADD ON

## 2014-07-22 LAB — URINALYSIS, ROUTINE W REFLEX MICROSCOPIC
Bilirubin Urine: NEGATIVE
GLUCOSE, UA: NEGATIVE mg/dL
Ketones, ur: 15 mg/dL — AB
Leukocytes, UA: NEGATIVE
Nitrite: NEGATIVE
PH: 7 (ref 5.0–8.0)
Protein, ur: NEGATIVE mg/dL
Specific Gravity, Urine: 1.01 (ref 1.005–1.030)
Urobilinogen, UA: 1 mg/dL (ref 0.0–1.0)

## 2014-07-22 LAB — GLUCOSE, CAPILLARY: GLUCOSE-CAPILLARY: 250 mg/dL — AB (ref 70–99)

## 2014-07-22 LAB — TROPONIN I: TROPONIN I: 0.38 ng/mL — AB (ref <0.30)

## 2014-07-22 MED ORDER — ENOXAPARIN SODIUM 40 MG/0.4ML ~~LOC~~ SOLN
40.0000 mg | SUBCUTANEOUS | Status: DC
  Filled 2014-07-22: qty 0.4

## 2014-07-22 MED ORDER — FUROSEMIDE 10 MG/ML IJ SOLN
40.0000 mg | Freq: Two times a day (BID) | INTRAMUSCULAR | Status: DC
  Administered 2014-07-22 – 2014-07-23 (×3): 40 mg via INTRAVENOUS
  Filled 2014-07-22 (×3): qty 4

## 2014-07-22 MED ORDER — BUDESONIDE-FORMOTEROL FUMARATE 80-4.5 MCG/ACT IN AERO
2.0000 | INHALATION_SPRAY | Freq: Two times a day (BID) | RESPIRATORY_TRACT | Status: DC
  Administered 2014-07-22 – 2014-07-24 (×4): 2 via RESPIRATORY_TRACT
  Filled 2014-07-22 (×2): qty 6.9

## 2014-07-22 MED ORDER — DEXTROSE 5 % IV SOLN
500.0000 mg | INTRAVENOUS | Status: DC
  Administered 2014-07-22 – 2014-07-23 (×2): 500 mg via INTRAVENOUS
  Filled 2014-07-22 (×2): qty 500

## 2014-07-22 MED ORDER — ALBUTEROL SULFATE (2.5 MG/3ML) 0.083% IN NEBU
2.5000 mg | INHALATION_SOLUTION | RESPIRATORY_TRACT | Status: DC | PRN
Start: 2014-07-22 — End: 2014-07-24

## 2014-07-22 MED ORDER — ALBUTEROL SULFATE (2.5 MG/3ML) 0.083% IN NEBU
2.5000 mg | INHALATION_SOLUTION | Freq: Four times a day (QID) | RESPIRATORY_TRACT | Status: DC
  Administered 2014-07-22: 2.5 mg via RESPIRATORY_TRACT
  Filled 2014-07-22: qty 3

## 2014-07-22 MED ORDER — WARFARIN - PHYSICIAN DOSING INPATIENT
Freq: Every day | Status: DC
  Administered 2014-07-22 – 2014-07-23 (×2)

## 2014-07-22 MED ORDER — IPRATROPIUM-ALBUTEROL 0.5-2.5 (3) MG/3ML IN SOLN
3.0000 mL | Freq: Four times a day (QID) | RESPIRATORY_TRACT | Status: DC
  Administered 2014-07-22 – 2014-07-24 (×9): 3 mL via RESPIRATORY_TRACT
  Filled 2014-07-22 (×9): qty 3

## 2014-07-22 MED ORDER — INSULIN ASPART 100 UNIT/ML ~~LOC~~ SOLN
0.0000 [IU] | Freq: Every day | SUBCUTANEOUS | Status: DC
  Administered 2014-07-22: 2 [IU] via SUBCUTANEOUS

## 2014-07-22 MED ORDER — TRAZODONE HCL 50 MG PO TABS
25.0000 mg | ORAL_TABLET | Freq: Every evening | ORAL | Status: DC | PRN
  Administered 2014-07-22 – 2014-07-23 (×3): 25 mg via ORAL
  Filled 2014-07-22 (×3): qty 1

## 2014-07-22 MED ORDER — ALUM & MAG HYDROXIDE-SIMETH 200-200-20 MG/5ML PO SUSP: 30.0000 mL | Freq: Four times a day (QID) | ORAL | Status: DC | PRN

## 2014-07-22 MED ORDER — ATORVASTATIN CALCIUM 20 MG PO TABS
20.0000 mg | ORAL_TABLET | Freq: Every day | ORAL | Status: DC
  Administered 2014-07-22 – 2014-07-24 (×3): 20 mg via ORAL
  Filled 2014-07-22 (×3): qty 1

## 2014-07-22 MED ORDER — DOCUSATE SODIUM 100 MG PO CAPS: 100.0000 mg | ORAL_CAPSULE | Freq: Every day | ORAL | Status: DC | PRN

## 2014-07-22 MED ORDER — IPRATROPIUM BROMIDE 0.02 % IN SOLN
0.5000 mg | Freq: Four times a day (QID) | RESPIRATORY_TRACT | Status: DC
  Administered 2014-07-22: 0.5 mg via RESPIRATORY_TRACT
  Filled 2014-07-22: qty 2.5

## 2014-07-22 MED ORDER — FUROSEMIDE 80 MG PO TABS: 80.0000 mg | ORAL_TABLET | Freq: Two times a day (BID) | ORAL | Status: DC

## 2014-07-22 MED ORDER — OXYCODONE-ACETAMINOPHEN 5-325 MG PO TABS
1.0000 | ORAL_TABLET | Freq: Four times a day (QID) | ORAL | Status: DC | PRN
  Administered 2014-07-22 (×2): 2 via ORAL
  Filled 2014-07-22 (×2): qty 2

## 2014-07-22 MED ORDER — ALBUTEROL SULFATE (2.5 MG/3ML) 0.083% IN NEBU: 2.5000 mg | INHALATION_SOLUTION | RESPIRATORY_TRACT | Status: DC | PRN

## 2014-07-22 MED ORDER — NYSTATIN 100000 UNIT/GM EX POWD
Freq: Three times a day (TID) | CUTANEOUS | Status: DC
  Administered 2014-07-22 – 2014-07-24 (×7): via TOPICAL
  Filled 2014-07-22: qty 15

## 2014-07-22 MED ORDER — METHYLPREDNISOLONE SODIUM SUCC 125 MG IJ SOLR
60.0000 mg | Freq: Four times a day (QID) | INTRAMUSCULAR | Status: DC
  Administered 2014-07-22 – 2014-07-23 (×4): 60 mg via INTRAVENOUS
  Filled 2014-07-22 (×4): qty 2

## 2014-07-22 MED ORDER — OXYCODONE-ACETAMINOPHEN 5-325 MG PO TABS
1.0000 | ORAL_TABLET | Freq: Four times a day (QID) | ORAL | Status: DC | PRN
  Administered 2014-07-23 – 2014-07-24 (×5): 1 via ORAL
  Filled 2014-07-22 (×5): qty 1

## 2014-07-22 MED ORDER — POTASSIUM CHLORIDE CRYS ER 20 MEQ PO TBCR
20.0000 meq | EXTENDED_RELEASE_TABLET | Freq: Every day | ORAL | Status: DC
  Administered 2014-07-22 – 2014-07-24 (×3): 20 meq via ORAL
  Filled 2014-07-22 (×3): qty 1

## 2014-07-22 MED ORDER — QUETIAPINE FUMARATE 25 MG PO TABS
50.0000 mg | ORAL_TABLET | Freq: Two times a day (BID) | ORAL | Status: DC
  Administered 2014-07-22 – 2014-07-24 (×5): 50 mg via ORAL
  Filled 2014-07-22: qty 2
  Filled 2014-07-22 (×2): qty 1
  Filled 2014-07-22 (×2): qty 2
  Filled 2014-07-22: qty 1
  Filled 2014-07-22: qty 2
  Filled 2014-07-22: qty 1
  Filled 2014-07-22: qty 2

## 2014-07-22 MED ORDER — GUAIFENESIN-DM 100-10 MG/5ML PO SYRP: 5.0000 mL | ORAL_SOLUTION | ORAL | Status: DC | PRN

## 2014-07-22 MED ORDER — INSULIN ASPART 100 UNIT/ML ~~LOC~~ SOLN
0.0000 [IU] | Freq: Three times a day (TID) | SUBCUTANEOUS | Status: DC
  Administered 2014-07-23: 3 [IU] via SUBCUTANEOUS
  Administered 2014-07-23: 5 [IU] via SUBCUTANEOUS
  Administered 2014-07-23: 9 [IU] via SUBCUTANEOUS
  Administered 2014-07-24: 2 [IU] via SUBCUTANEOUS

## 2014-07-22 MED ORDER — WARFARIN SODIUM 2 MG PO TABS
4.0000 mg | ORAL_TABLET | Freq: Every day | ORAL | Status: DC
  Administered 2014-07-22: 4 mg via ORAL
  Filled 2014-07-22: qty 2

## 2014-07-22 MED ORDER — AMLODIPINE BESYLATE 5 MG PO TABS
2.5000 mg | ORAL_TABLET | Freq: Every day | ORAL | Status: DC
  Administered 2014-07-22 – 2014-07-24 (×3): 2.5 mg via ORAL
  Filled 2014-07-22 (×3): qty 1

## 2014-07-22 MED ORDER — PANTOPRAZOLE SODIUM 40 MG PO TBEC
40.0000 mg | DELAYED_RELEASE_TABLET | Freq: Every day | ORAL | Status: DC
  Administered 2014-07-22 – 2014-07-24 (×3): 40 mg via ORAL
  Filled 2014-07-22 (×3): qty 1

## 2014-07-22 MED ORDER — METOPROLOL SUCCINATE ER 25 MG PO TB24
25.0000 mg | ORAL_TABLET | Freq: Every day | ORAL | Status: DC
  Administered 2014-07-22 – 2014-07-24 (×3): 25 mg via ORAL
  Filled 2014-07-22 (×3): qty 1

## 2014-07-22 NOTE — ED Notes (Signed)
Patient calling stating that she is still nauseated at this time and she needs something to drink.

## 2014-07-22 NOTE — Progress Notes (Signed)
Patient was admitted to the hospital earlier this morning by Dr. Adrian Blackwater.  Patient seen and examined.  She was admitted with complaints of shortness of breath. She was found to be wheezing bilaterally. She does have a history of oxygen-dependent COPD. She was also found to have mild acute on chronic congestive heart failure and was started on intravenous Lasix. Troponin was noted to be mildly elevated, but this appears to be a chronic issue. She's been started on nebulizer treatments and steroids. Her breathing appears to be improving. The patient has chronic pain. On my visit today, patient was having difficulty staying awake during conversation, but insisted that she had back pain requiring morphine. I explained to her that with her ongoing somnolence, she would not be prescribed any further narcotics. I have asked the nursing staff to ambulate the patient. If she continues to improve, anticipate discharge in the next 24-48 hours.  Sharea Guinther

## 2014-07-22 NOTE — H&P (Addendum)
History and Physical  Kim [name]:[id number] DOB: 1966/02/10 DOA: 07/21/2014  Referring physician: emergency Department PCP: Dorian Heckle, MD   Chief Complaint: shortness of breath  HPI: Kim [name] is a 48 y.o. female with a history of COPD and, chronic respiratory failure, obesity, atrial fibrillation on anticoagulation, hepatitis C, tobacco abuse, chronic elevated troponin level, a recent hospitalization with pneumonia from 07/16/2014 to 07/17/2014. Patient was discharged on Levaquin.  Her breathing became worse 2 days ago with dyspnea on exertion and shortness of breath. She continues to have some mild discomfort with coughing. Nebulizer treatments have not been helpful.  She denies fevers, chills, nausea, vomiting. She has been taking her antibiotics.  Review of Systems:   Pt complains of leg pain, anxiousness, cough, wheeze.  Pt denies any fevers, chills, nausea, vomiting, abdominal pain, diarrhea, constipation.  Review of systems are otherwise negative  Past Medical History  Diagnosis Date  . COPD (chronic obstructive pulmonary disease)     Home O2 3L  . Coronary atherosclerosis of native coronary artery     Cardiac catheterization 07/2012 - LAD 40%; small D2 with 60 to 70% ostial; OM1 30 to 40%; RCA 20%; PL1 40%   . Hypothyroidism   . Hepatitis C   . Pneumonia   . Cirrhosis   . Type 2 diabetes mellitus   . GERD (gastroesophageal reflux disease)   . Headache(784.0)   . Anxiety   . Atrial fibrillation and flutter   . Chronic pain   . PE (pulmonary embolism)   . Chronic anticoagulation   . History of medication noncompliance   . SSS (sick sinus syndrome)     Biotronik pacemaker  . Cardiomyopathy     LVEF 40-45%   Past Surgical History  Procedure Laterality Date  . Cholecystectomy    . Pacemaker insertion  02/2012    Biotronik PPM   . Cardiac electrophysiology study and ablation    . Esophagogastroduodenoscopy  Jan 2010    Dr. Arlyce Dice: multiple gastric  erosions, benign path  . Esophagogastroduodenoscopy N/A 01/10/2014    DJS:[id number] EG junction. Query short segment Barrett's esophagus-status post pinch biopsy as described above. Hiatal hernia; otherwise negative EGD   Social History:  reports that she has been smoking Cigarettes.  She has a 15 pack-year smoking history. Her smokeless tobacco use includes Snuff. She reports that she does not drink alcohol or use illicit drugs. Patient lives at home & is able to participate in activities of daily living without assistance  Allergies  Allergen Reactions  . Nitroglycerin Other (See Comments)    CAUSED NUMBNESS ALL OVER  . Doxycycline Itching  . Flexeril [Cyclobenzaprine] Other (See Comments)    Sweating, Lightheaded   . Ibuprofen Other (See Comments)    Increase BP, dizziness, lightheaded  . Tramadol Itching  . Tylenol [Acetaminophen] Other (See Comments)    Pt has Hep C  . Vesicare [Solifenacin] Other (See Comments)    Makes my sweat turn yellow  . Amoxil [Amoxicillin] Nausea Only    Family History  Problem Relation Age of Onset  . Coronary artery disease Father 73  . Coronary artery disease Mother 73  . Coronary artery disease Brother   . Colon cancer Neg Hx       Prior to Admission medications   Medication Sig Start Date End Date Taking? Authorizing Provider  albuterol (PROVENTIL HFA;VENTOLIN HFA) 108 (90 BASE) MCG/ACT inhaler Inhale 2 puffs into the lungs every 6 (six) hours as needed for wheezing  or shortness of breath.    Yes Historical Provider, MD  amLODipine (NORVASC) 2.5 MG tablet Take 1 tablet (2.5 mg total) by mouth daily. 07/01/14  Yes Standley Brookinganiel P Goodrich, MD  atorvastatin (LIPITOR) 20 MG tablet Take 20 mg by mouth daily.   Yes Historical Provider, MD  budesonide-formoterol (SYMBICORT) 80-4.5 MCG/ACT inhaler Inhale 2 puffs into the lungs 2 (two) times daily.   Yes Historical Provider, MD  furosemide (LASIX) 80 MG tablet Take 1 tablet (80 mg total) by mouth 2 (two) times  daily. 06/14/14  Yes Standley Brookinganiel P Goodrich, MD  levofloxacin (LEVAQUIN) 500 MG tablet Take 1 tablet (500 mg total) by mouth daily. 07/17/14 07/22/14 Yes Hollice EspySendil K Krishnan, MD  metoprolol succinate (TOPROL-XL) 25 MG 24 hr tablet Take 1 tablet (25 mg total) by mouth daily. 06/14/14  Yes Lesle ChrisKaren M Black, NP  pantoprazole (PROTONIX) 40 MG tablet Take 40 mg by mouth daily.   Yes Historical Provider, MD  potassium chloride SA (K-DUR,KLOR-CON) 20 MEQ tablet Take 1 tablet (20 mEq total) by mouth daily. 06/07/14  Yes Lyanne CoKevin M Campos, MD  QUEtiapine (SEROQUEL) 25 MG tablet Take 50 mg by mouth 2 (two) times daily. 12/08/13  Yes Historical Provider, MD  tiotropium (SPIRIVA) 18 MCG inhalation capsule Place 18 mcg into inhaler and inhale daily.   Yes Historical Provider, MD  warfarin (COUMADIN) 4 MG tablet Take 4 mg by mouth daily.   Yes Historical Provider, MD    Physical Exam: BP 116/61  Pulse 73  Temp(Src) 97.9 F (36.6 C) (Oral)  Resp 34  Ht 5\' 9"  (1.753 m)  Wt 95.255 kg (210 lb)  BMI 31.00 kg/m2  SpO2 91%  LMP 02/08/2007  General:  This is a middle-aged Caucasian female with wheezing on exam, although no acute pulmonary distress. Eyes: pupils equal and round. There is a right cataract which makes slight response to initial outside. No icterus ENT: no mucosal lesions.  Neck: neck is supple without lymphadenopathy. Cardiovascular: irregular rate, no murmurs rubs gallops. Mild JVD. Respiratory: wheezing throughout. Mild rales bibasilar Abdomen: soft, nontender, nondistended. Positive bowel sounds. No rebound tenderness or masses palpated. Skin: mild intertrigo of her pannus on the right side with an area measuring approximately 7 cm Musculoskeletal: a major joints are nonerythematous and non-swollen Psychiatric: appropriate Neurologic: no focal neurological deficits observed          Labs on Admission:  Basic Metabolic Panel:  Recent Labs Lab 07/15/14 2300 07/16/14 0620 07/17/14 1126 07/21/14 2100    NA 140 141 143 137  K 3.6* 3.1* 3.0* 4.4  CL 100 100 96 95*  CO2 30 29 33* 31  GLUCOSE 106* 159* 106* 88  BUN 7 7 6 7   CREATININE 0.73 0.73 0.75 0.67  CALCIUM 8.6 8.0* 8.3* 9.0  MG  --   --   --  1.7   Liver Function Tests:  Recent Labs Lab 07/15/14 2300 07/16/14 0620 07/21/14 2100  AST 32 29 42*  ALT 14 14 16   ALKPHOS 80 82 73  BILITOT 0.8 0.7 1.4*  PROT 7.2 6.9 7.3  ALBUMIN 3.4* 3.3* 3.6    Recent Labs Lab 07/15/14 2300  LIPASE 30   No results found for this basename: AMMONIA,  in the last 168 hours CBC:  Recent Labs Lab 07/16/14 0620 07/21/14 2010  WBC 7.4 8.1  HGB 12.8 13.0  HCT 38.9 39.9  MCV 105.4* 105.6*  PLT 192 214   Cardiac Enzymes:  Recent Labs Lab 07/16/14 0157 07/16/14  1610 07/16/14 1345 07/17/14 1126 07/21/14 2100  TROPONINI 0.42* 0.55* 0.47* 0.43* 0.44*    BNP (last 3 results)  Recent Labs  07/15/14 2300 07/17/14 1126 07/21/14 2100  PROBNP 2008.0* 1105.0* 2436.0*   CBG: No results found for this basename: GLUCAP,  in the last 168 hours  Radiological Exams on Admission: Dg Chest 2 View  07/21/2014   CLINICAL DATA:  Shortness of breath and hemoptysis.  EXAM: CHEST  2 VIEW  COMPARISON:  PA and lateral chest 07/15/2014.  CT chest 03/10/2014.  FINDINGS: Right effusion and airspace disease persist without marked change. The left lung is clear. Heart size is upper normal. No pneumothorax. Pacing device is in place.  IMPRESSION: No marked change in a right pleural effusion and airspace disease since most recent examination.   Electronically Signed   By: Drusilla Kanner M.D.   On: 07/21/2014 21:02    Assessment/Plan Present on Admission:  . Acute on chronic systolic heart failure . Chronic respiratory failure . COPD with acute exacerbation . COPD exacerbation . Candidiasis, intertrigo  #1 COPD with exacerbation Admit Azithromycin Nebulizer treatments Steroids  #69mild acute on chronic systolic heart failure Continue  Lasix  #3 chronic respiratory failure Continue O2 as needed  #4 candidiasis intertrigo Nystatin powder  #5 chronic A. Fib Coumadin  #6 elevated troponins We'll repeat in 6 hours from the first draw  Consultants: none  Code Status: full code  Family Communication: none   Disposition Plan: return home after being stabilized  Time spent: 70 minutes  Candelaria Celeste, [place] [name] Pager [phone]  **Disclaimer: This note may have been dictated with voice recognition software. Similar sounding words can inadvertently be transcribed and this note may contain transcription errors which may not have been corrected upon publication of note.**

## 2014-07-23 ENCOUNTER — Inpatient Hospital Stay (HOSPITAL_COMMUNITY): Payer: Medicaid Other

## 2014-07-23 DIAGNOSIS — J90 Pleural effusion, not elsewhere classified: Secondary | ICD-10-CM

## 2014-07-23 DIAGNOSIS — I5023 Acute on chronic systolic (congestive) heart failure: Secondary | ICD-10-CM

## 2014-07-23 LAB — BASIC METABOLIC PANEL
ANION GAP: 9 (ref 5–15)
BUN: 12 mg/dL (ref 6–23)
CALCIUM: 8.8 mg/dL (ref 8.4–10.5)
CO2: 33 mEq/L — ABNORMAL HIGH (ref 19–32)
CREATININE: 0.66 mg/dL (ref 0.50–1.10)
Chloride: 97 mEq/L (ref 96–112)
GFR calc non Af Amer: >90 mL/min (ref >90)
Glucose, Bld: 210 mg/dL — ABNORMAL HIGH (ref 70–99)
Potassium: 4.6 mEq/L (ref 3.7–5.3)
Sodium: 139 mEq/L (ref 137–147)

## 2014-07-23 LAB — GLUCOSE, CAPILLARY
GLUCOSE-CAPILLARY: 356 mg/dL — AB (ref 70–99)
Glucose-Capillary: 249 mg/dL — ABNORMAL HIGH (ref 70–99)
Glucose-Capillary: 258 mg/dL — ABNORMAL HIGH (ref 70–99)
Glucose-Capillary: 172 mg/dL — ABNORMAL HIGH (ref 70–99)

## 2014-07-23 LAB — CBC
HCT: 38 % (ref 36.0–46.0)
Hemoglobin: 12.3 g/dL (ref 12.0–15.0)
MCH: 34.1 pg — ABNORMAL HIGH (ref 26.0–34.0)
MCHC: 32.4 g/dL (ref 30.0–36.0)
MCV: 105.3 fL — ABNORMAL HIGH (ref 78.0–100.0)
Platelets: 208 K/uL (ref 150–400)
RBC: 3.61 MIL/uL — ABNORMAL LOW (ref 3.87–5.11)
RDW: 14.9 % (ref 11.5–15.5)
WBC: 13.8 K/uL — ABNORMAL HIGH (ref 4.0–10.5)

## 2014-07-23 LAB — COMPREHENSIVE METABOLIC PANEL
ALK PHOS: 67 U/L (ref 39–117)
ALT: 14 U/L (ref 0–35)
AST: 23 U/L (ref 0–37)
Albumin: 3.3 g/dL — ABNORMAL LOW (ref 3.5–5.2)
Anion gap: 14 (ref 5–15)
BILIRUBIN TOTAL: 0.5 mg/dL (ref 0.3–1.2)
BUN: 16 mg/dL (ref 6–23)
CHLORIDE: 97 meq/L (ref 96–112)
CO2: 29 mEq/L (ref 19–32)
Calcium: 8.8 mg/dL (ref 8.4–10.5)
Creatinine, Ser: 0.79 mg/dL (ref 0.50–1.10)
GFR calc non Af Amer: >90 mL/min (ref >90)
GLUCOSE: 228 mg/dL — AB (ref 70–99)
POTASSIUM: 4.5 meq/L (ref 3.7–5.3)
Sodium: 140 mEq/L (ref 137–147)
TOTAL PROTEIN: 7.1 g/dL (ref 6.0–8.3)

## 2014-07-23 LAB — PROTIME-INR
INR: 1.13 (ref 0.00–1.49)
Prothrombin Time: 14.5 s (ref 11.6–15.2)

## 2014-07-23 LAB — PRO B NATRIURETIC PEPTIDE: Pro B Natriuretic peptide (BNP): 1639 pg/mL — ABNORMAL HIGH (ref 0–125)

## 2014-07-23 MED ORDER — DEXTROSE 5 % IV SOLN
INTRAVENOUS | Status: AC
  Filled 2014-07-23: qty 500

## 2014-07-23 MED ORDER — AZITHROMYCIN 250 MG PO TABS
500.0000 mg | ORAL_TABLET | Freq: Every day | ORAL | Status: DC
  Administered 2014-07-23: 500 mg via ORAL
  Filled 2014-07-23: qty 2

## 2014-07-23 MED ORDER — FUROSEMIDE 10 MG/ML IJ SOLN
80.0000 mg | Freq: Two times a day (BID) | INTRAMUSCULAR | Status: DC
  Administered 2014-07-23 – 2014-07-24 (×2): 80 mg via INTRAVENOUS
  Filled 2014-07-23 (×2): qty 8

## 2014-07-23 MED ORDER — PREDNISONE 20 MG PO TABS
40.0000 mg | ORAL_TABLET | Freq: Every day | ORAL | Status: DC
  Administered 2014-07-24: 40 mg via ORAL
  Filled 2014-07-23: qty 2

## 2014-07-23 NOTE — Progress Notes (Signed)
UR review complete.  
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
## 2014-07-23 NOTE — Progress Notes (Signed)
[name] PROGRESS NOTE  Kim DavenportSandra F [name] NWG:[id number] DOB: 1966/11/05 DOA: 07/21/2014 PCP: Dorian HeckleGILLIAM, RYAN D, MD  Assessment/Plan: 1. COPD exacerbation. Patient was noted to be wheezing on admission. She received steroids, antibiotics and nebulizer treatments. Her wheezing appears to have resolved. Will transition her to oral prednisone. 2. Chronic respiratory failure. Patient is chronically on 2-3 L of oxygen at home. We'll continue the same. 3. Acute on chronic systolic congestive heart failure. Improved with intravenous Lasix. 4. Right-sided pleural effusion. Discussed with radiology and also that this may be enough fluid for thoracentesis. Aspirating this fluid may improve the patient symptomatically. Will attempt thoracentesis on 8/18. 5. Chronic atrial fibrillation. Patient is currently on Coumadin the INR subtherapeutic. We'll likely transition to Xarelto once thoracentesis has been attempted  Code Status: Full code Family Communication: Discussed with patient Disposition Plan: Discharge home once improved   Consultants:    Procedures:    Antibiotics:  Azithromycin 8/16  HPI/Subjective: Reports that her urine is "dark". Does not feel well. Still feel short of breath. Repeatedly asks to stay in the hospital for one more day  Objective: Filed Vitals:   07/23/14 0620  BP: 108/56  Pulse: 80  Temp: 97.1 F (36.2 C)  Resp: 20    Intake/Output Summary (Last 24 hours) at 07/23/14 1823 Last data filed at 07/23/14 1329  Gross per 24 hour  Intake    860 ml  Output    600 ml  Net    260 ml   Filed Weights   07/21/14 1831 07/22/14 0641  Weight: 95.255 kg (210 lb) 108.274 kg (238 lb 11.2 oz)    Exam:   General:  NAD  Cardiovascular: S1, S2 RRR  Respiratory: crackles at bases  Abdomen: soft, nt, nd, bs+  Musculoskeletal: trace edema b/l   Data Reviewed: Basic Metabolic Panel:  Recent Labs Lab 07/17/14 1126 07/21/14 2100 07/22/14 0351  07/23/14 0532 07/23/14 1509  NA 143 137 135* 139 140  K 3.0* 4.4 4.3 4.6 4.5  CL 96 95* 95* 97 97  CO2 33* 31 25 33* 29  GLUCOSE 106* 88 204* 210* 228*  BUN 6 7 8 12 16   CREATININE 0.75 0.67 0.59 0.66 0.79  CALCIUM 8.3* 9.0 8.8 8.8 8.8  MG  --  1.7  --   --   --    Liver Function Tests:  Recent Labs Lab 07/21/14 2100 07/22/14 0351 07/23/14 1509  AST 42* 44* 23  ALT 16 16 14   ALKPHOS 73 69 67  BILITOT 1.4* 1.1 0.5  PROT 7.3 7.2 7.1  ALBUMIN 3.6 3.4* 3.3*   No results found for this basename: LIPASE, AMYLASE,  in the last 168 hours No results found for this basename: AMMONIA,  in the last 168 hours CBC:  Recent Labs Lab 07/21/14 2010 07/22/14 0351 07/23/14 0532  WBC 8.1 6.1 13.8*  HGB 13.0 12.7 12.3  HCT 39.9 38.3 38.0  MCV 105.6* 104.4* 105.3*  PLT 214 179 208   Cardiac Enzymes:  Recent Labs Lab 07/17/14 1126 07/21/14 2100 07/22/14 0351  TROPONINI 0.43* 0.44* 0.38*   BNP (last 3 results)  Recent Labs  07/17/14 1126 07/21/14 2100 07/23/14 1509  PROBNP 1105.0* 2436.0* 1639.0*   CBG:  Recent Labs Lab 07/22/14 2000 07/23/14 0731 07/23/14 1155 07/23/14 1625  GLUCAP 250* 249* 258* 356*    Recent Results (from the past 240 hour(s))  CULTURE, BLOOD (ROUTINE X 2)     Status: None   Collection Time  07/16/14  1:57 AM      Result Value Ref Range Status   Specimen Description BLOOD LEFT ANTECUBITAL   Final   Special Requests BOTTLES DRAWN AEROBIC AND ANAEROBIC 7CC   Final   Culture NO GROWTH 5 DAYS   Final   Report Status 07/21/2014 FINAL   Final  CULTURE, BLOOD (ROUTINE X 2)     Status: None   Collection Time    07/16/14  1:59 AM      Result Value Ref Range Status   Specimen Description BLOOD LEFT HAND   Final   Special Requests BOTTLES DRAWN AEROBIC AND ANAEROBIC 7CC   Final   Culture NO GROWTH 5 DAYS   Final   Report Status 07/21/2014 FINAL   Final  MRSA PCR SCREENING     Status: Abnormal   Collection Time    07/16/14 12:45 PM       Result Value Ref Range Status   MRSA by PCR POSITIVE (*) NEGATIVE Final   Comment:            The GeneXpert MRSA Assay (FDA     approved for NASAL specimens     only), is one component of a     comprehensive MRSA colonization     surveillance program. It is not     intended to diagnose MRSA     infection nor to guide or     monitor treatment for     MRSA infections.     RESULT CALLED TO, READ BACK BY AND VERIFIED WITH:     MCDANIEL,M. AT 1833 ON 07/16/2014 BY BAUGHAM,M.     Studies: Dg Chest 2 View  07/21/2014   CLINICAL DATA:  Shortness of breath and hemoptysis.  EXAM: CHEST  2 VIEW  COMPARISON:  PA and lateral chest 07/15/2014.  CT chest 03/10/2014.  FINDINGS: Right effusion and airspace disease persist without marked change. The left lung is clear. Heart size is upper normal. No pneumothorax. Pacing device is in place.  IMPRESSION: No marked change in a right pleural effusion and airspace disease since most recent examination.   Electronically Signed   By: Drusilla Kanner M.D.   On: 07/21/2014 21:02    Scheduled Meds: . amLODipine  2.5 mg Oral Daily  . aspirin  324 mg Oral Once  . atorvastatin  20 mg Oral Daily  . azithromycin  500 mg Oral q1800  . budesonide-formoterol  2 puff Inhalation BID  . furosemide  80 mg Intravenous BID  . insulin aspart  0-5 Units Subcutaneous QHS  . insulin aspart  0-9 Units Subcutaneous TID WC  . ipratropium-albuterol  3 mL Nebulization Q6H  . metoprolol succinate  25 mg Oral Daily  . nystatin   Topical TID  . pantoprazole  40 mg Oral Daily  . potassium chloride SA  20 mEq Oral Daily  . [START ON 07/24/2014] predniSONE  40 mg Oral Q breakfast  . QUEtiapine  50 mg Oral BID  . Warfarin - Physician Dosing Inpatient   Does not apply q1800   Continuous Infusions:   Active Problems:   Acute on chronic systolic heart failure   Chronic respiratory failure   COPD with acute exacerbation   COPD exacerbation   Candidiasis, intertrigo   Pleural  effusion    Time spent:    MEMON,JEHANZEB  [name] Pager [phone]. If 7PM-7AM, please contact night-coverage at [email or web address], password [facility] 07/23/2014, 6:23 PM  LOS: 2 days

## 2014-07-23 NOTE — Progress Notes (Signed)
Inpatient Diabetes Program Recommendations  AACE/ADA: New Consensus Statement on Inpatient Glycemic Control (2013)  Target Ranges:  Prepandial:   less than 140 mg/dL      Peak postprandial:   less than 180 mg/dL (1-2 hours)      Critically ill patients:  140 - 180 mg/dL   Results for Kim [name], Kim [name] (MRN [id number]) as of 07/23/2014 08:59  Ref. Range 12/15/2013 02:40  Hemoglobin A1C Latest Range: <5.7 % 6.4 (H)   Results for Kim [name], Kim [name] (MRN [id number]) as of 07/23/2014 08:59  Ref. Range 07/22/2014 20:00 07/23/2014 07:31  Glucose-Capillary Latest Range: 70-99 mg/dL 324 (H) 401 (H)   Diabetes history: DM2 Outpatient Diabetes medications: None Current orders for Inpatient glycemic control: Novolog 0-9 units AC, Novolog 0-5 units HS  Inpatient Diabetes Program Recommendations Insulin - Basal: Please consider ordering low dose basal insulin if steroids are not decreased. Recommend starting with Levemir 10 units daily. Correction (SSI): Please consider increasing Novolog correction scale. HgbA1C: Please consider ordering an A1C to evaluate glycemic control over the past 2-3 months. The last A1C in the chart is 6.4% on 12-15-13. Diet: Added Carb Modified to current Heart Healthy diet.  Thanks, Orlando Penner, RN, MSN, CCRN Diabetes Coordinator Inpatient Diabetes Program [phone] (Team Pager) [phone] ([place] office) [phone] [facility] office)

## 2014-07-23 NOTE — Progress Notes (Signed)
PHARMACIST - PHYSICIAN COMMUNICATION DR:   Memon CONCERNING: Antibiotic IV to Oral Route Change Policy  RECOMMENDATION: This patient is receiving Zithromax by the intravenous route.  Based on criteria approved by the Pharmacy and Therapeutics Committee, the antibiotic(s) is/are being converted to the equivalent oral dose form(s).  DESCRIPTION: These criteria include:  Patient being treated for a respiratory tract infection, urinary tract infection, cellulitis or clostridium difficile associated diarrhea if on metronidazole  The patient is not neutropenic and does not exhibit a GI malabsorption state  The patient is eating (either orally or via tube) and/or has been taking other orally administered medications for a least 24 hours  The patient is improving clinically and has a Tmax < 100.5  If you have questions about this conversion, please contact the Pharmacy Department  [x]  ( [phone] )  [place] []  ( [phone] )  [place]  []  ( [phone] )  [facility] []  ( [phone] )  [facility]   S. Mahika Vanvoorhis, PharmD  

## 2014-07-24 ENCOUNTER — Encounter (HOSPITAL_COMMUNITY): Payer: Self-pay

## 2014-07-24 ENCOUNTER — Inpatient Hospital Stay (HOSPITAL_COMMUNITY): Payer: Medicaid Other

## 2014-07-24 DIAGNOSIS — Z7901 Long term (current) use of anticoagulants: Secondary | ICD-10-CM

## 2014-07-24 DIAGNOSIS — J9610 Chronic respiratory failure, unspecified whether with hypoxia or hypercapnia: Secondary | ICD-10-CM

## 2014-07-24 DIAGNOSIS — R0902 Hypoxemia: Secondary | ICD-10-CM

## 2014-07-24 LAB — BODY FLUID CELL COUNT WITH DIFFERENTIAL
Eos, Fluid: 0 %
LYMPHS FL: 43 %
Monocyte-Macrophage-Serous Fluid: 54 % (ref 50–90)
NEUTROPHIL FLUID: 3 % (ref 0–25)
Total Nucleated Cell Count, Fluid: 1116 cu mm — ABNORMAL HIGH (ref 0–1000)

## 2014-07-24 LAB — CBC
HCT: 40.7 % (ref 36.0–46.0)
HEMOGLOBIN: 13.1 g/dL (ref 12.0–15.0)
MCH: 33.9 pg (ref 26.0–34.0)
MCHC: 32.2 g/dL (ref 30.0–36.0)
MCV: 105.4 fL — ABNORMAL HIGH (ref 78.0–100.0)
Platelets: 234 10*3/uL (ref 150–400)
RBC: 3.86 MIL/uL — ABNORMAL LOW (ref 3.87–5.11)
RDW: 15 % (ref 11.5–15.5)
WBC: 15.9 10*3/uL — ABNORMAL HIGH (ref 4.0–10.5)

## 2014-07-24 LAB — GLUCOSE, CAPILLARY
GLUCOSE-CAPILLARY: 113 mg/dL — AB (ref 70–99)
Glucose-Capillary: 193 mg/dL — ABNORMAL HIGH (ref 70–99)

## 2014-07-24 LAB — PROTIME-INR
INR: 1.14 (ref 0.00–1.49)
Prothrombin Time: 14.6 seconds (ref 11.6–15.2)

## 2014-07-24 LAB — PROTEIN, BODY FLUID: Total protein, fluid: 3.2 g/dL

## 2014-07-24 LAB — LACTATE DEHYDROGENASE, PLEURAL OR PERITONEAL FLUID: LD FL: 112 U/L — AB (ref 3–23)

## 2014-07-24 MED ORDER — APIXABAN 5 MG PO TABS: 5.0000 mg | ORAL_TABLET | Freq: Two times a day (BID) | ORAL | Status: DC

## 2014-07-24 MED ORDER — PREDNISONE 10 MG PO TABS: ORAL_TABLET | ORAL | Status: DC

## 2014-07-24 MED ORDER — HYDROXYZINE HCL 25 MG PO TABS: 25.0000 mg | ORAL_TABLET | Freq: Three times a day (TID) | ORAL | Status: DC | PRN

## 2014-07-24 MED ORDER — LEVOFLOXACIN 500 MG PO TABS: 500.0000 mg | ORAL_TABLET | Freq: Every day | ORAL | Status: AC

## 2014-07-24 NOTE — Discharge Summary (Signed)
Physician Discharge Summary  Kim [name] OVF:[id number] DOB: 1966-01-09 DOA: 07/21/2014  PCP: Dorian Heckle, MD  Admit date: 07/21/2014 Discharge date: 07/24/2014  Time spent: 40 minutes  Recommendations for Outpatient Follow-up:  1. Patient has been set up at the [facility] clinic for primary care.  Discharge Diagnoses:  Active Problems:   Acute on chronic systolic heart failure   Chronic respiratory failure   COPD with acute exacerbation   COPD exacerbation   Candidiasis, intertrigo   Pleural effusion   Discharge Condition: improved  Diet recommendation: low salt  Filed Weights   07/21/14 1831 07/22/14 0641 07/24/14 0510  Weight: 95.255 kg (210 lb) 108.274 kg (238 lb 11.2 oz) 109.9 kg (242 lb 4.6 oz)    History of present illness and hospital course:  This patient was admitted to the hospital with shortness of breath. She has chronic respiratory failure on 2-3 L of oxygen. She was noted to be significantly wheezing. It was felt that she may have an element of COPD exacerbation as well as congestive heart failure. She was started on intravenous Lasix as well as steroids, nebulizer treatments and antibiotics. Treatment she did improve, but still complained of shortness of breath. Review of her x-ray indicated right-sided pleural effusion. This was discussed with radiology and she underwent thoracentesis. Results of pleural fluid indicating a possible exudative effusion, possibly parapneumonic. The patient was treated with a course of Levaquin and will be discharged home on the same. She does have a history of atrial fibrillation and appears to be noncompliant with her Coumadin. Her INR was subtherapeutic. She has been transitioned over to Eliquis for hopefully better compliance and she does not need to check any blood tests. She's been set up with home health services. She'll be on a prednisone taper, continue on bronchodilators and antibiotics. She is also back on her home dose of  diuretics. The patient does have chronically elevated troponin was not felt to have an acute ACS during her hospital stay. She feels significantly improved since admission and is otherwise stable for discharge  Procedures:  Right-sided thoracentesis with removal of 1080 mL fluid  Consultations:    Discharge Exam: Filed Vitals:   07/24/14 1049  BP: 127/73  Pulse: 78  Temp:   Resp: 20    General: NAD Cardiovascular: S1, s2 RRR Respiratory: CTA B  Discharge Instructions You were cared for by a hospitalist during your hospital stay. If you have any questions about your discharge medications or the care you received while you were in the hospital after you are discharged, you can call the unit and asked to speak with the hospitalist on call if the hospitalist that took care of you is not available. Once you are discharged, your primary care physician will handle any further medical issues. Please note that NO REFILLS for any discharge medications will be authorized once you are discharged, as it is imperative that you return to your primary care physician (or establish a relationship with a primary care physician if you do not have one) for your aftercare needs so that they can reassess your need for medications and monitor your lab values.  Discharge Instructions   Call MD for:  difficulty breathing, headache or visual disturbances    Complete by:  As directed      Call MD for:  temperature >100.4    Complete by:  As directed      Diet - low sodium heart healthy    Complete by:  As directed      Increase activity slowly    Complete by:  As directed             Medication List    STOP taking these medications       aspirin EC 81 MG tablet     warfarin 4 MG tablet  Commonly known as:  COUMADIN      TAKE these medications       albuterol 108 (90 BASE) MCG/ACT inhaler  Commonly known as:  PROVENTIL HFA;VENTOLIN HFA  Inhale 2 puffs into the lungs every 6 (six) hours as  needed for wheezing or shortness of breath.     amLODipine 2.5 MG tablet  Commonly known as:  NORVASC  Take 1 tablet (2.5 mg total) by mouth daily.     apixaban 5 MG Tabs tablet  Commonly known as:  ELIQUIS  Take 1 tablet (5 mg total) by mouth 2 (two) times daily.     atorvastatin 20 MG tablet  Commonly known as:  LIPITOR  Take 20 mg by mouth daily.     budesonide-formoterol 80-4.5 MCG/ACT inhaler  Commonly known as:  SYMBICORT  Inhale 2 puffs into the lungs 2 (two) times daily.     furosemide 80 MG tablet  Commonly known as:  LASIX  Take 1 tablet (80 mg total) by mouth 2 (two) times daily.     hydrOXYzine 25 MG tablet  Commonly known as:  ATARAX/VISTARIL  Take 1 tablet (25 mg total) by mouth 3 (three) times daily as needed.     levofloxacin 500 MG tablet  Commonly known as:  LEVAQUIN  Take 1 tablet (500 mg total) by mouth daily.     metoprolol succinate 25 MG 24 hr tablet  Commonly known as:  TOPROL-XL  Take 1 tablet (25 mg total) by mouth daily.     pantoprazole 40 MG tablet  Commonly known as:  PROTONIX  Take 40 mg by mouth daily.     potassium chloride SA 20 MEQ tablet  Commonly known as:  K-DUR,KLOR-CON  Take 1 tablet (20 mEq total) by mouth daily.     predniSONE 10 MG tablet  Commonly known as:  DELTASONE  Take 40mg  po daily for 2 days, then 30mg  po daily for 2 days then 20mg  po daily for 2 days then 10mg  po daily for 2 days then stop     QUEtiapine 25 MG tablet  Commonly known as:  SEROQUEL  Take 50 mg by mouth 2 (two) times daily.     tiotropium 18 MCG inhalation capsule  Commonly known as:  SPIRIVA  Place 18 mcg into inhaler and inhale daily.       Allergies  Allergen Reactions  . Nitroglycerin Other (See Comments)    CAUSED NUMBNESS ALL OVER  . Doxycycline Itching  . Flexeril [Cyclobenzaprine] Other (See Comments)    Sweating, Lightheaded   . Ibuprofen Other (See Comments)    Increase BP, dizziness, lightheaded  . Tramadol Itching  . Tylenol  [Acetaminophen] Other (See Comments)    Pt has Hep C  . Vesicare [Solifenacin] Other (See Comments)    Makes my sweat turn yellow  . Amoxil [Amoxicillin] Nausea Only       Follow-up Information   Follow up with Rondel Baton On 08/01/2014. (9am)    Contact information:   [address] [place] [address] [phone]        The results of significant diagnostics from this hospitalization (  including imaging, microbiology, ancillary and laboratory) are listed below for reference.    Significant Diagnostic Studies: Dg Chest 1 View  07/24/2014   CLINICAL DATA:  RIGHT pleural effusion post thoracentesis  EXAM: CHEST - 1 VIEW EXPIRATORY  COMPARISON:  07/21/2014  FINDINGS: LEFT subclavian transvenous pacemaker leads project over RIGHT atrium and RIGHT ventricle, unchanged.  Enlargement of cardiac silhouette with pulmonary vascular congestion.  Significant decrease in RIGHT pleural effusion and basilar atelectasis post thoracentesis.  No pneumothorax.  Minimal LEFT basilar atelectasis.  Upper lungs clear.  IMPRESSION: Significant decrease in RIGHT pleural effusion and basilar atelectasis post thoracentesis without pneumothorax.  Enlargement of cardiac silhouette with pulmonary vascular congestion.   Electronically Signed   By: Ulyses Southward M.D.   On: 07/24/2014 11:34   Dg Chest 2 View  07/21/2014   CLINICAL DATA:  Shortness of breath and hemoptysis.  EXAM: CHEST  2 VIEW  COMPARISON:  PA and lateral chest 07/15/2014.  CT chest 03/10/2014.  FINDINGS: Right effusion and airspace disease persist without marked change. The left lung is clear. Heart size is upper normal. No pneumothorax. Pacing device is in place.  IMPRESSION: No marked change in a right pleural effusion and airspace disease since most recent examination.   Electronically Signed   By: Drusilla Kanner M.D.   On: 07/21/2014 21:02   Dg Chest 2 View  07/15/2014   CLINICAL DATA:  Shortness of breath, cough and weakness.  EXAM:  CHEST  2 VIEW  COMPARISON:  Chest radiograph performed 06/28/2014  FINDINGS: There is a mildly loculated small right pleural effusion, with right basilar airspace opacification. This likely reflects pneumonia. The left lung appears relatively clear. No pneumothorax is seen.  The heart is enlarged. A pacemaker is seen at the left chest wall, with leads ending at the right atrium and right ventricle. No acute osseous abnormalities are identified.  IMPRESSION: 1. Mildly loculated small right pleural effusion, with right basilar airspace opacification. This likely reflects pneumonia. Would complete treatment for pneumonia, then perform follow-up chest radiograph to ensure resolution of airspace opacities 2. Cardiomegaly.   Electronically Signed   By: Roanna Raider M.D.   On: 07/15/2014 22:31   Dg Chest 2 View  06/28/2014   CLINICAL DATA:  Chest pain and shortness of breath. History of smoking.  EXAM: CHEST  2 VIEW  COMPARISON:  Chest radiograph performed 06/12/2014  FINDINGS: The lungs are well-aerated and clear. There is no evidence of focal opacification, pleural effusion or pneumothorax.  The heart is borderline normal in size; the mediastinal contour is within normal limits. A pacemaker is noted at the left chest wall, with leads ending at the right atrium and right ventricle. No acute osseous abnormalities are seen.  IMPRESSION: No acute cardiopulmonary process seen.   Electronically Signed   By: Roanna Raider M.D.   On: 06/28/2014 23:22   Ct Head Wo Contrast  06/29/2014   CLINICAL DATA:  Right-sided visual loss. Cloudiness at the right pupil.  EXAM: CT HEAD WITHOUT CONTRAST  TECHNIQUE: Contiguous axial images were obtained from the base of the skull through the vertex without intravenous contrast.  COMPARISON:  CT of the head performed 07/21/2013  FINDINGS: There is no evidence of acute infarction, mass lesion, or intra- or extra-axial hemorrhage on CT.  Mild subcortical white matter change likely reflects  small vessel ischemic microangiopathy.  The posterior fossa, including the cerebellum, brainstem and fourth ventricle, is within normal limits. The third and lateral ventricles, and basal ganglia  are unremarkable in appearance. The cerebral hemispheres demonstrate grossly normal gray-white differentiation. No mass effect or midline shift is seen.  There is no evidence of fracture; visualized osseous structures are unremarkable in appearance. The visualized portions of the orbits are within normal limits. The paranasal sinuses and mastoid air cells are well-aerated. No significant soft tissue abnormalities are seen.  IMPRESSION: 1. No acute intracranial pathology seen on CT. 2. Mild small vessel ischemic microangiopathy.   Electronically Signed   By: Roanna RaiderJeffery  Chang M.D.   On: 06/29/2014 00:25   [place] Thoracentesis Asp Pleural Space W/img Guide  07/24/2014   CLINICAL DATA:  RIGHT pleural effusion  EXAM: US THORACENTESIS ASP PLEURAL SPACE W/IMG GUIDE  TECHNIQUE: Procedure, benefits, and risks of procedure were discussed with patient.  Written informed consent for procedure was obtained.  Time out protocol followed.  Pleural effusion localized by ultrasound at the posterior RIGHT hemi thorax.  Skin prepped and draped in usual sterile fashion.  Skin and soft tissues anesthetized with 9 mL of 1% lidocaine.  8 French thoracentesis catheter placed into the RIGHT pleural space.  1080 mL of yellow fluid aspirated by syringe pump.  Procedure tolerated well by patient without immediate complication.  Fluid sent to laboratory for requested analysis.  COMPARISON:  Chest radiograph 07/21/2014  FINDINGS: As above  IMPRESSION: Ultrasound guided RIGHT thoracentesis as above.   Electronically Signed   By: Ulyses SouthwardMark  Boles M.D.   On: 07/24/2014 11:03    Microbiology: Recent Results (from the past 240 hour(s))  CULTURE, BLOOD (ROUTINE X 2)     Status: None   Collection Time    07/16/14  1:57 AM      Result Value Ref Range Status    Specimen Description BLOOD LEFT ANTECUBITAL   Final   Special Requests BOTTLES DRAWN AEROBIC AND ANAEROBIC 7CC   Final   Culture NO GROWTH 5 DAYS   Final   Report Status 07/21/2014 FINAL   Final  CULTURE, BLOOD (ROUTINE X 2)     Status: None   Collection Time    07/16/14  1:59 AM      Result Value Ref Range Status   Specimen Description BLOOD LEFT HAND   Final   Special Requests BOTTLES DRAWN AEROBIC AND ANAEROBIC 7CC   Final   Culture NO GROWTH 5 DAYS   Final   Report Status 07/21/2014 FINAL   Final  MRSA PCR SCREENING     Status: Abnormal   Collection Time    07/16/14 12:45 PM      Result Value Ref Range Status   MRSA by PCR POSITIVE (*) NEGATIVE Final   Comment:            The GeneXpert MRSA Assay (FDA     approved for NASAL specimens     only), is one component of a     comprehensive MRSA colonization     surveillance program. It is not     intended to diagnose MRSA     infection nor to guide or     monitor treatment for     MRSA infections.     RESULT CALLED TO, READ BACK BY AND VERIFIED WITH:     MCDANIEL,M. AT 1833 ON 07/16/2014 BY BAUGHAM,M.     Labs: Basic Metabolic Panel:  Recent Labs Lab 07/21/14 2100 07/22/14 0351 07/23/14 0532 07/23/14 1509  NA 137 135* 139 140  K 4.4 4.3 4.6 4.5  CL 95* 95* 97 97  CO2 31 25  33* 29  GLUCOSE 88 204* 210* 228*  BUN 7 8 12 16   CREATININE 0.67 0.59 0.66 0.79  CALCIUM 9.0 8.8 8.8 8.8  MG 1.7  --   --   --    Liver Function Tests:  Recent Labs Lab 07/21/14 2100 07/22/14 0351 07/23/14 1509  AST 42* 44* 23  ALT 16 16 14   ALKPHOS 73 69 67  BILITOT 1.4* 1.1 0.5  PROT 7.3 7.2 7.1  ALBUMIN 3.6 3.4* 3.3*   No results found for this basename: LIPASE, AMYLASE,  in the last 168 hours No results found for this basename: AMMONIA,  in the last 168 hours CBC:  Recent Labs Lab 07/21/14 2010 07/22/14 0351 07/23/14 0532 07/24/14 0507  WBC 8.1 6.1 13.8* 15.9*  HGB 13.0 12.7 12.3 13.1  HCT 39.9 38.3 38.0 40.7  MCV  105.6* 104.4* 105.3* 105.4*  PLT 214 179 208 234   Cardiac Enzymes:  Recent Labs Lab 07/21/14 2100 07/22/14 0351  TROPONINI 0.44* 0.38*   BNP: BNP (last 3 results)  Recent Labs  07/17/14 1126 07/21/14 2100 07/23/14 1509  PROBNP 1105.0* 2436.0* 1639.0*   CBG:  Recent Labs Lab 07/23/14 1155 07/23/14 1625 07/23/14 2141 07/24/14 0750 07/24/14 1200  GLUCAP 258* 356* 172* 113* 193*       Signed:  Sakai Heinle  [name] 07/24/2014, 8:49 PM

## 2014-07-24 NOTE — Progress Notes (Signed)
Inpatient Diabetes Program Recommendations  AACE/ADA: New Consensus Statement on Inpatient Glycemic Control (2013)  Target Ranges:  Prepandial:   less than 140 mg/dL      Peak postprandial:   less than 180 mg/dL (1-2 hours)      Critically ill patients:  140 - 180 mg/dL   Results for Kim [name], Kim [name] (MRN [id number]) as of 07/24/2014 08:38  Ref. Range 07/23/2014 07:31 07/23/2014 11:55 07/23/2014 16:25 07/23/2014 21:41 07/24/2014 07:50  Glucose-Capillary Latest Range: 70-99 mg/dL 158 (H) 309 (H) 407 (H) 172 (H) 113 (H)   Diabetes history: DM2  Outpatient Diabetes medications: None  Current orders for Inpatient glycemic control: Novolog 0-9 units AC, Novolog 0-5 units HS  Inpatient Diabetes Program Recommendations Correction (SSI): Please consider increasing Novolog correction scale. Insulin - Meal Coverage: While inpatient and ordered steroids, please consider ordering Novolog 4 units TID with meals. HgbA1C: Please consider ordering an A1C to evaluate glycemic control over the past 2-3 months. The last A1C in the chart is 6.4% on 12-15-13.  Thanks, Orlando Penner, RN, MSN, CCRN Diabetes Coordinator Inpatient Diabetes Program [phone] (Team Pager) [phone] ([place] office) [phone] [facility] office)

## 2014-07-24 NOTE — Progress Notes (Signed)
07/24/14 for discharge at 1630, went over discharge instructions with patient. Removed her IV and she was transported via wheelchair in NAD.

## 2014-07-24 NOTE — Progress Notes (Signed)
Thoracentesis complete no signs of distress. amber colored pleural fluid removed

## 2014-07-24 NOTE — Procedures (Signed)
PreOperative Dx: RIGHT pleural effusion Postoperative Dx: RIGHT pleural effusion Procedure:   US guided RIGHT thoracentesis Radiologist:  Tyron Russell Anesthesia:  9 ml of 1% lidocaine Specimen:  1080 ml of yellow colored fluid EBL:   < 1 ml Complications: None

## 2014-07-25 LAB — PATHOLOGIST SMEAR REVIEW

## 2014-07-27 ENCOUNTER — Encounter (HOSPITAL_COMMUNITY): Payer: Self-pay | Admitting: Emergency Medicine

## 2014-07-27 ENCOUNTER — Emergency Department (HOSPITAL_COMMUNITY)
Admission: EM | Admit: 2014-07-27 | Discharge: 2014-07-27 | Disposition: A | Discharge status: Home / Self Care | Payer: Medicaid Other | Attending: Emergency Medicine | Admitting: Emergency Medicine

## 2014-07-27 DIAGNOSIS — G8929 Other chronic pain: Secondary | ICD-10-CM | POA: Insufficient documentation

## 2014-07-27 DIAGNOSIS — Z8701 Personal history of pneumonia (recurrent): Secondary | ICD-10-CM | POA: Insufficient documentation

## 2014-07-27 DIAGNOSIS — M25562 Pain in left knee: Secondary | ICD-10-CM

## 2014-07-27 DIAGNOSIS — Z7901 Long term (current) use of anticoagulants: Secondary | ICD-10-CM | POA: Diagnosis not present

## 2014-07-27 DIAGNOSIS — R296 Repeated falls: Secondary | ICD-10-CM | POA: Insufficient documentation

## 2014-07-27 DIAGNOSIS — F17200 Nicotine dependence, unspecified, uncomplicated: Secondary | ICD-10-CM | POA: Diagnosis not present

## 2014-07-27 DIAGNOSIS — J4489 Other specified chronic obstructive pulmonary disease: Secondary | ICD-10-CM | POA: Insufficient documentation

## 2014-07-27 DIAGNOSIS — B369 Superficial mycosis, unspecified: Secondary | ICD-10-CM | POA: Diagnosis not present

## 2014-07-27 DIAGNOSIS — Z7902 Long term (current) use of antithrombotics/antiplatelets: Secondary | ICD-10-CM | POA: Diagnosis not present

## 2014-07-27 DIAGNOSIS — E119 Type 2 diabetes mellitus without complications: Secondary | ICD-10-CM | POA: Diagnosis not present

## 2014-07-27 DIAGNOSIS — Z8619 Personal history of other infectious and parasitic diseases: Secondary | ICD-10-CM | POA: Diagnosis not present

## 2014-07-27 DIAGNOSIS — R002 Palpitations: Secondary | ICD-10-CM | POA: Diagnosis not present

## 2014-07-27 DIAGNOSIS — I4891 Unspecified atrial fibrillation: Secondary | ICD-10-CM | POA: Insufficient documentation

## 2014-07-27 DIAGNOSIS — S99919A Unspecified injury of unspecified ankle, initial encounter: Secondary | ICD-10-CM

## 2014-07-27 DIAGNOSIS — K219 Gastro-esophageal reflux disease without esophagitis: Secondary | ICD-10-CM | POA: Insufficient documentation

## 2014-07-27 DIAGNOSIS — L02219 Cutaneous abscess of trunk, unspecified: Secondary | ICD-10-CM | POA: Insufficient documentation

## 2014-07-27 DIAGNOSIS — F411 Generalized anxiety disorder: Secondary | ICD-10-CM | POA: Insufficient documentation

## 2014-07-27 DIAGNOSIS — S8000XA Contusion of unspecified knee, initial encounter: Secondary | ICD-10-CM | POA: Insufficient documentation

## 2014-07-27 DIAGNOSIS — Z79899 Other long term (current) drug therapy: Secondary | ICD-10-CM | POA: Diagnosis not present

## 2014-07-27 DIAGNOSIS — Z86711 Personal history of pulmonary embolism: Secondary | ICD-10-CM | POA: Diagnosis not present

## 2014-07-27 DIAGNOSIS — Y939 Activity, unspecified: Secondary | ICD-10-CM | POA: Diagnosis not present

## 2014-07-27 DIAGNOSIS — I2510 Atherosclerotic heart disease of native coronary artery without angina pectoris: Secondary | ICD-10-CM | POA: Diagnosis not present

## 2014-07-27 DIAGNOSIS — R51 Headache: Secondary | ICD-10-CM | POA: Diagnosis not present

## 2014-07-27 DIAGNOSIS — Z950 Presence of cardiac pacemaker: Secondary | ICD-10-CM | POA: Insufficient documentation

## 2014-07-27 DIAGNOSIS — J449 Chronic obstructive pulmonary disease, unspecified: Secondary | ICD-10-CM | POA: Diagnosis not present

## 2014-07-27 DIAGNOSIS — L03319 Cellulitis of trunk, unspecified: Secondary | ICD-10-CM

## 2014-07-27 DIAGNOSIS — S8990XA Unspecified injury of unspecified lower leg, initial encounter: Secondary | ICD-10-CM | POA: Diagnosis not present

## 2014-07-27 DIAGNOSIS — Y929 Unspecified place or not applicable: Secondary | ICD-10-CM | POA: Diagnosis not present

## 2014-07-27 DIAGNOSIS — S99929A Unspecified injury of unspecified foot, initial encounter: Secondary | ICD-10-CM

## 2014-07-27 MED ORDER — HYDROCODONE-ACETAMINOPHEN 5-325 MG PO TABS
1.0000 | ORAL_TABLET | Freq: Once | ORAL | Status: AC
  Administered 2014-07-27: 1 via ORAL
  Filled 2014-07-27: qty 1

## 2014-07-27 MED ORDER — FLUCONAZOLE 100 MG PO TABS
200.0000 mg | ORAL_TABLET | Freq: Every day | ORAL | Status: DC
  Administered 2014-07-27: 200 mg via ORAL
  Filled 2014-07-27: qty 2

## 2014-07-27 MED ORDER — HYDROCODONE-ACETAMINOPHEN 5-325 MG PO TABS: 1.0000 | ORAL_TABLET | ORAL | Status: DC | PRN

## 2014-07-27 NOTE — ED Notes (Signed)
Patient with no complaints at this time. Respirations even and unlabored. Skin warm/dry. Discharge instructions reviewed with patient at this time. Patient given opportunity to voice concerns/ask questions. Patient discharged at this time and left Emergency Department with steady gait.   

## 2014-07-27 NOTE — Discharge Instructions (Signed)
Please as the area under your stomach with soap and water daily. Please apply antifungal powder (Tinactin or similar) 3 times daily. Please use the knee immobilizer for the next 5-7 days. Please see your doctor for additional evaluation if not improving in 5-7 days.

## 2014-07-27 NOTE — ED Provider Notes (Signed)
Medical screening examination/treatment/procedure(s) were performed by non-physician practitioner and as supervising physician I was immediately available for consultation/collaboration.     Geoffery Lyons, MD 07/27/14 2237

## 2014-07-27 NOTE — ED Notes (Signed)
Abscess to right lower abd x 1 wk with drainage.

## 2014-07-27 NOTE — ED Provider Notes (Signed)
CSN: [id number]     Arrival date & time 07/27/14  1711 History   First MD Initiated Contact with Patient 07/27/14 1832     Chief Complaint  Patient presents with  . Abscess     (Consider location/radiation/quality/duration/timing/severity/associated sxs/prior Treatment) HPI Comments: Patient is a 48 year old female who presents to the emergency department with complaint of abscess of the right lower abdomen. The patient states that for over a week now she has had some clear drainage sometimes with an older coming from the area under her right abdomen. She's not had any fever or chills. She's noticed some increased redness, but is unsure of any red streaking. She states she has pain, especially when she walks or changes positions in that area.  The patient's guess also reports that the patient fell and injured her left knee, and he requested that be evaluated as well.  Patient is a 48 y.o. female presenting with abscess. The history is provided by the patient.  Abscess Associated symptoms: headaches     Past Medical History  Diagnosis Date  . COPD (chronic obstructive pulmonary disease)     Home O2 3L  . Coronary atherosclerosis of native coronary artery     Cardiac catheterization 07/2012 - LAD 40%; small D2 with 60 to 70% ostial; OM1 30 to 40%; RCA 20%; PL1 40%   . Hypothyroidism   . Hepatitis C   . Pneumonia   . Cirrhosis   . Type 2 diabetes mellitus   . GERD (gastroesophageal reflux disease)   . Headache(784.0)   . Anxiety   . Atrial fibrillation and flutter   . Chronic pain   . PE (pulmonary embolism)   . Chronic anticoagulation   . History of medication noncompliance   . SSS (sick sinus syndrome)     Biotronik pacemaker  . Cardiomyopathy     LVEF 40-45%   Past Surgical History  Procedure Laterality Date  . Cholecystectomy    . Pacemaker insertion  02/2012    Biotronik PPM   . Cardiac electrophysiology study and ablation    . Esophagogastroduodenoscopy  Jan 2010    Dr. Arlyce DiceKaplan: multiple gastric erosions, benign path  . Esophagogastroduodenoscopy N/A 01/10/2014    WUJ:WJXBJYNWRMR:Patulous EG junction. Query short segment Barrett's esophagus-status post pinch biopsy as described above. Hiatal hernia; otherwise negative EGD   Family History  Problem Relation Age of Onset  . Coronary artery disease Father 263  . Coronary artery disease Mother [id number]  . Coronary artery disease Brother   . Colon cancer Neg Hx    History  Substance Use Topics  . Smoking status: Current Every Day Smoker -- 0.50 packs/day for 30 years    Types: Cigarettes  . Smokeless tobacco: Current User    Types: Snuff  . Alcohol Use: No   OB History   Grav Para Term Preterm Abortions TAB SAB Ect Mult Living                 Review of Systems  Constitutional: Negative for activity change.       All ROS Neg except as noted in HPI  HENT: Negative for nosebleeds.   Eyes: Negative for photophobia and discharge.  Respiratory: Positive for cough. Negative for shortness of breath and wheezing.   Cardiovascular: Positive for palpitations. Negative for chest pain.  Gastrointestinal: Positive for abdominal pain. Negative for blood in stool.  Genitourinary: Negative for dysuria, frequency and hematuria.  Musculoskeletal: Negative for arthralgias, back pain and neck pain.  Skin:  Negative.   Neurological: Positive for headaches. Negative for dizziness, seizures and speech difficulty.  Psychiatric/Behavioral: Negative for hallucinations and confusion.      Allergies  Nitroglycerin; Doxycycline; Flexeril; Ibuprofen; Tramadol; Tylenol; Vesicare; Hydralazine; and Amoxil  Home Medications   Prior to Admission medications   Medication Sig Start Date End Date Taking? Authorizing Provider  albuterol (PROVENTIL HFA;VENTOLIN HFA) 108 (90 BASE) MCG/ACT inhaler Inhale 2 puffs into the lungs every 6 (six) hours as needed for wheezing or shortness of breath.    Yes Historical Provider, MD  amLODipine (NORVASC) 2.5  MG tablet Take 1 tablet (2.5 mg total) by mouth daily. 07/01/14  Yes Standley Brooking, MD  apixaban (ELIQUIS) 5 MG TABS tablet Take 1 tablet (5 mg total) by mouth 2 (two) times daily. 07/24/14  Yes Erick Blinks, MD  atorvastatin (LIPITOR) 20 MG tablet Take 20 mg by mouth daily.   Yes Historical Provider, MD  budesonide-formoterol (SYMBICORT) 80-4.5 MCG/ACT inhaler Inhale 2 puffs into the lungs 2 (two) times daily.   Yes Historical Provider, MD  furosemide (LASIX) 80 MG tablet Take 1 tablet (80 mg total) by mouth 2 (two) times daily. 06/14/14  Yes Standley Brooking, MD  levofloxacin (LEVAQUIN) 500 MG tablet Take 1 tablet (500 mg total) by mouth daily. 07/24/14 07/29/14 Yes Erick Blinks, MD  metoprolol succinate (TOPROL-XL) 25 MG 24 hr tablet Take 1 tablet (25 mg total) by mouth daily. 06/14/14  Yes Lesle Chris Black, NP  pantoprazole (PROTONIX) 40 MG tablet Take 40 mg by mouth daily.   Yes Historical Provider, MD  potassium chloride SA (K-DUR,KLOR-CON) 20 MEQ tablet Take 1 tablet (20 mEq total) by mouth daily. 06/07/14  Yes Lyanne Co, MD  predniSONE (DELTASONE) 10 MG tablet Take 40mg  po daily for 2 days, then 30mg  po daily for 2 days then 20mg  po daily for 2 days then 10mg  po daily for 2 days then stop 07/24/14  Yes Erick Blinks, MD  QUEtiapine (SEROQUEL) 25 MG tablet Take 50 mg by mouth 2 (two) times daily. 12/08/13  Yes Historical Provider, MD  tiotropium (SPIRIVA) 18 MCG inhalation capsule Place 18 mcg into inhaler and inhale daily.   Yes Historical Provider, MD  HYDROcodone-acetaminophen (NORCO/VICODIN) 5-325 MG per tablet Take 1 tablet by mouth every 4 (four) hours as needed. 07/27/14   Kathie Dike, PA-C   BP 152/88  Pulse 86  Temp(Src) 97.9 F (36.6 C) (Oral)  Resp 17  Ht 6' (1.829 m)  Wt 250 lb (113.399 kg)  BMI 33.90 kg/m2  SpO2 99%  LMP 02/08/2007 Physical Exam  Nursing note and vitals reviewed. Constitutional: She is oriented to person, place, and time. She appears well-developed and  well-nourished.  Non-toxic appearance.  HENT:  Head: Normocephalic.  Right Ear: Tympanic membrane and external ear normal.  Left Ear: Tympanic membrane and external ear normal.  Eyes: EOM and lids are normal. Pupils are equal, round, and reactive to light.  Neck: Normal range of motion. Neck supple. Carotid bruit is not present.  Cardiovascular: Normal rate, regular rhythm, normal heart sounds, intact distal pulses and normal pulses.   Pulmonary/Chest: Breath sounds normal. No respiratory distress.  Abdominal: Soft. Bowel sounds are normal. There is no tenderness. There is no guarding.  There is redness with some raw areas skin fold of the right lower abdomen. There is no red streaking. There is no abscess. There is no evidence of a secondary infection. There is some soreness and tenderness to palpation particularly over the  raw areas. The abdomen is not hot to touch.  Musculoskeletal: Normal range of motion.  There is no deformity of the quadricep area or the anterior tibial tuberosity on the left. There is soreness with range of motion of the left knee. There is no evidence for dislocation. There is no posterior mass. The patella is midline. There is no effusion appreciated. There's no deformity of the tibial area. There is full range of motion of the left ankle and toes. The dorsalis pedis pulses 2+. The compartments are soft.  Lymphadenopathy:       Head (right side): No submandibular adenopathy present.       Head (left side): No submandibular adenopathy present.    She has no cervical adenopathy.  Neurological: She is alert and oriented to person, place, and time. She has normal strength. No cranial nerve deficit or sensory deficit.  Skin: Skin is warm and dry.  Psychiatric: She has a normal mood and affect. Her speech is normal.    ED Course  Procedures (including critical care time) Labs Review Labs Reviewed - No data to display  Imaging Review No results found.   EKG  Interpretation None      MDM Examination is consistent with a fungal skin infection under the skin folds. Patient cautioned to be observant for advancement of any infection. The patient will be treated with antifungal powder and Diflucan.  Patient's knee pain is consistent with contusion to the knee. The patient be fitted with a knee immobilizer. Prescription for Norco given to the patient. The patient states that she can take Norco because it only has 325 mg of the Tylenol in it.    Final diagnoses:  Fungal skin infection  Knee pain, acute, left    **I have reviewed nursing notes, vital signs, and all appropriate lab and imaging results for this patient.Kathie Dike, PA-C 07/27/14 1902

## 2014-07-28 LAB — BODY FLUID CULTURE: CULTURE: NO GROWTH

## 2014-08-20 ENCOUNTER — Emergency Department (HOSPITAL_COMMUNITY): Payer: Medicaid Other

## 2014-08-20 ENCOUNTER — Encounter (HOSPITAL_COMMUNITY): Payer: Self-pay | Admitting: Emergency Medicine

## 2014-08-20 ENCOUNTER — Inpatient Hospital Stay (HOSPITAL_COMMUNITY)
Admission: EM | Admit: 2014-08-20 | Discharge: 2014-08-24 | DRG: 190 | Disposition: A | Discharge status: Home Health | Payer: Medicaid Other | Attending: Internal Medicine | Admitting: Internal Medicine

## 2014-08-20 DIAGNOSIS — K089 Disorder of teeth and supporting structures, unspecified: Secondary | ICD-10-CM

## 2014-08-20 DIAGNOSIS — K219 Gastro-esophageal reflux disease without esophagitis: Secondary | ICD-10-CM | POA: Diagnosis present

## 2014-08-20 DIAGNOSIS — Z9114 Patient's other noncompliance with medication regimen: Secondary | ICD-10-CM

## 2014-08-20 DIAGNOSIS — E119 Type 2 diabetes mellitus without complications: Secondary | ICD-10-CM | POA: Diagnosis present

## 2014-08-20 DIAGNOSIS — K7460 Unspecified cirrhosis of liver: Secondary | ICD-10-CM | POA: Diagnosis present

## 2014-08-20 DIAGNOSIS — R778 Other specified abnormalities of plasma proteins: Secondary | ICD-10-CM | POA: Diagnosis present

## 2014-08-20 DIAGNOSIS — Z7901 Long term (current) use of anticoagulants: Secondary | ICD-10-CM | POA: Diagnosis not present

## 2014-08-20 DIAGNOSIS — E876 Hypokalemia: Secondary | ICD-10-CM

## 2014-08-20 DIAGNOSIS — I509 Heart failure, unspecified: Secondary | ICD-10-CM | POA: Diagnosis present

## 2014-08-20 DIAGNOSIS — B372 Candidiasis of skin and nail: Secondary | ICD-10-CM

## 2014-08-20 DIAGNOSIS — I4891 Unspecified atrial fibrillation: Secondary | ICD-10-CM

## 2014-08-20 DIAGNOSIS — L989 Disorder of the skin and subcutaneous tissue, unspecified: Secondary | ICD-10-CM

## 2014-08-20 DIAGNOSIS — Z950 Presence of cardiac pacemaker: Secondary | ICD-10-CM | POA: Diagnosis not present

## 2014-08-20 DIAGNOSIS — Z9119 Patient's noncompliance with other medical treatment and regimen: Secondary | ICD-10-CM

## 2014-08-20 DIAGNOSIS — I428 Other cardiomyopathies: Secondary | ICD-10-CM | POA: Diagnosis present

## 2014-08-20 DIAGNOSIS — R0789 Other chest pain: Secondary | ICD-10-CM

## 2014-08-20 DIAGNOSIS — I25118 Atherosclerotic heart disease of native coronary artery with other forms of angina pectoris: Secondary | ICD-10-CM

## 2014-08-20 DIAGNOSIS — R197 Diarrhea, unspecified: Secondary | ICD-10-CM

## 2014-08-20 DIAGNOSIS — I2510 Atherosclerotic heart disease of native coronary artery without angina pectoris: Secondary | ICD-10-CM | POA: Diagnosis present

## 2014-08-20 DIAGNOSIS — F17200 Nicotine dependence, unspecified, uncomplicated: Secondary | ICD-10-CM | POA: Diagnosis present

## 2014-08-20 DIAGNOSIS — G8929 Other chronic pain: Secondary | ICD-10-CM | POA: Diagnosis present

## 2014-08-20 DIAGNOSIS — J9620 Acute and chronic respiratory failure, unspecified whether with hypoxia or hypercapnia: Secondary | ICD-10-CM | POA: Diagnosis present

## 2014-08-20 DIAGNOSIS — I5023 Acute on chronic systolic (congestive) heart failure: Secondary | ICD-10-CM

## 2014-08-20 DIAGNOSIS — R0600 Dyspnea, unspecified: Secondary | ICD-10-CM

## 2014-08-20 DIAGNOSIS — I10 Essential (primary) hypertension: Secondary | ICD-10-CM | POA: Diagnosis present

## 2014-08-20 DIAGNOSIS — Z91199 Patient's noncompliance with other medical treatment and regimen due to unspecified reason: Secondary | ICD-10-CM

## 2014-08-20 DIAGNOSIS — Z6841 Body Mass Index (BMI) 40.0 and over, adult: Secondary | ICD-10-CM

## 2014-08-20 DIAGNOSIS — Z8249 Family history of ischemic heart disease and other diseases of the circulatory system: Secondary | ICD-10-CM

## 2014-08-20 DIAGNOSIS — R0602 Shortness of breath: Secondary | ICD-10-CM

## 2014-08-20 DIAGNOSIS — J189 Pneumonia, unspecified organism: Secondary | ICD-10-CM

## 2014-08-20 DIAGNOSIS — F418 Other specified anxiety disorders: Secondary | ICD-10-CM

## 2014-08-20 DIAGNOSIS — I5043 Acute on chronic combined systolic (congestive) and diastolic (congestive) heart failure: Secondary | ICD-10-CM | POA: Diagnosis present

## 2014-08-20 DIAGNOSIS — T380X5A Adverse effect of glucocorticoids and synthetic analogues, initial encounter: Secondary | ICD-10-CM | POA: Diagnosis present

## 2014-08-20 DIAGNOSIS — E669 Obesity, unspecified: Secondary | ICD-10-CM | POA: Diagnosis present

## 2014-08-20 DIAGNOSIS — F341 Dysthymic disorder: Secondary | ICD-10-CM | POA: Diagnosis present

## 2014-08-20 DIAGNOSIS — Z9981 Dependence on supplemental oxygen: Secondary | ICD-10-CM | POA: Diagnosis not present

## 2014-08-20 DIAGNOSIS — Z720 Tobacco use: Secondary | ICD-10-CM

## 2014-08-20 DIAGNOSIS — Z86711 Personal history of pulmonary embolism: Secondary | ICD-10-CM

## 2014-08-20 DIAGNOSIS — J9610 Chronic respiratory failure, unspecified whether with hypoxia or hypercapnia: Secondary | ICD-10-CM

## 2014-08-20 DIAGNOSIS — E039 Hypothyroidism, unspecified: Secondary | ICD-10-CM | POA: Diagnosis present

## 2014-08-20 DIAGNOSIS — R739 Hyperglycemia, unspecified: Secondary | ICD-10-CM

## 2014-08-20 DIAGNOSIS — B182 Chronic viral hepatitis C: Secondary | ICD-10-CM

## 2014-08-20 DIAGNOSIS — R7989 Other specified abnormal findings of blood chemistry: Secondary | ICD-10-CM

## 2014-08-20 DIAGNOSIS — I5032 Chronic diastolic (congestive) heart failure: Secondary | ICD-10-CM

## 2014-08-20 DIAGNOSIS — I5033 Acute on chronic diastolic (congestive) heart failure: Secondary | ICD-10-CM

## 2014-08-20 DIAGNOSIS — J411 Mucopurulent chronic bronchitis: Secondary | ICD-10-CM

## 2014-08-20 DIAGNOSIS — J90 Pleural effusion, not elsewhere classified: Secondary | ICD-10-CM

## 2014-08-20 DIAGNOSIS — R1310 Dysphagia, unspecified: Secondary | ICD-10-CM

## 2014-08-20 DIAGNOSIS — I482 Chronic atrial fibrillation, unspecified: Secondary | ICD-10-CM

## 2014-08-20 DIAGNOSIS — J441 Chronic obstructive pulmonary disease with (acute) exacerbation: Principal | ICD-10-CM

## 2014-08-20 DIAGNOSIS — J9612 Chronic respiratory failure with hypercapnia: Secondary | ICD-10-CM

## 2014-08-20 DIAGNOSIS — R079 Chest pain, unspecified: Secondary | ICD-10-CM

## 2014-08-20 DIAGNOSIS — Z91148 Patient's other noncompliance with medication regimen for other reason: Secondary | ICD-10-CM

## 2014-08-20 LAB — CBC WITH DIFFERENTIAL/PLATELET
BASOS PCT: 0 % (ref 0–1)
Basophils Absolute: 0 10*3/uL (ref 0.0–0.1)
EOS ABS: 0.2 10*3/uL (ref 0.0–0.7)
Eosinophils Relative: 3 % (ref 0–5)
HCT: 40.8 % (ref 36.0–46.0)
Hemoglobin: 13.2 g/dL (ref 12.0–15.0)
Lymphocytes Relative: 23 % (ref 12–46)
Lymphs Abs: 1.7 10*3/uL (ref 0.7–4.0)
MCH: 33.5 pg (ref 26.0–34.0)
MCHC: 32.4 g/dL (ref 30.0–36.0)
MCV: 103.6 fL — ABNORMAL HIGH (ref 78.0–100.0)
MONO ABS: 0.8 10*3/uL (ref 0.1–1.0)
Monocytes Relative: 10 % (ref 3–12)
NEUTROS PCT: 64 % (ref 43–77)
Neutro Abs: 4.9 10*3/uL (ref 1.7–7.7)
Platelets: 196 10*3/uL (ref 150–400)
RBC: 3.94 MIL/uL (ref 3.87–5.11)
RDW: 14.5 % (ref 11.5–15.5)
WBC: 7.6 10*3/uL (ref 4.0–10.5)

## 2014-08-20 LAB — BASIC METABOLIC PANEL
Anion gap: 10 (ref 5–15)
BUN: 8 mg/dL (ref 6–23)
CHLORIDE: 101 meq/L (ref 96–112)
CO2: 30 mEq/L (ref 19–32)
CREATININE: 0.59 mg/dL (ref 0.50–1.10)
Calcium: 8.9 mg/dL (ref 8.4–10.5)
GFR calc non Af Amer: >90 mL/min (ref >90)
Glucose, Bld: 97 mg/dL (ref 70–99)
Potassium: 4.4 mEq/L (ref 3.7–5.3)
Sodium: 141 mEq/L (ref 137–147)

## 2014-08-20 LAB — PROTIME-INR
INR: 1.08 (ref 0.00–1.49)
Prothrombin Time: 14 seconds (ref 11.6–15.2)

## 2014-08-20 LAB — TROPONIN I
Troponin I: 0.57 ng/mL (ref <0.30)
Troponin I: 0.31 ng/mL (ref <0.30)

## 2014-08-20 LAB — PRO B NATRIURETIC PEPTIDE: Pro B Natriuretic peptide (BNP): 1380 pg/mL — ABNORMAL HIGH (ref 0–125)

## 2014-08-20 MED ORDER — BUDESONIDE-FORMOTEROL FUMARATE 80-4.5 MCG/ACT IN AERO
2.0000 | INHALATION_SPRAY | Freq: Two times a day (BID) | RESPIRATORY_TRACT | Status: DC
  Administered 2014-08-20 – 2014-08-24 (×8): 2 via RESPIRATORY_TRACT
  Filled 2014-08-20: qty 6.9

## 2014-08-20 MED ORDER — SODIUM CHLORIDE 0.9 % IJ SOLN
3.0000 mL | Freq: Two times a day (BID) | INTRAMUSCULAR | Status: DC
  Administered 2014-08-20 – 2014-08-23 (×5): 3 mL via INTRAVENOUS

## 2014-08-20 MED ORDER — IPRATROPIUM-ALBUTEROL 0.5-2.5 (3) MG/3ML IN SOLN
3.0000 mL | Freq: Once | RESPIRATORY_TRACT | Status: AC
  Administered 2014-08-20: 3 mL via RESPIRATORY_TRACT
  Filled 2014-08-20 (×2): qty 3

## 2014-08-20 MED ORDER — QUETIAPINE FUMARATE 25 MG PO TABS
ORAL_TABLET | ORAL | Status: AC
Start: 2014-08-20 — End: 2014-08-20
  Filled 2014-08-20: qty 2

## 2014-08-20 MED ORDER — INFLUENZA VAC SPLIT QUAD 0.5 ML IM SUSY
0.5000 mL | PREFILLED_SYRINGE | INTRAMUSCULAR | Status: AC
  Administered 2014-08-21: 0.5 mL via INTRAMUSCULAR
  Filled 2014-08-20: qty 0.5

## 2014-08-20 MED ORDER — FUROSEMIDE 80 MG PO TABS
80.0000 mg | ORAL_TABLET | Freq: Two times a day (BID) | ORAL | Status: DC
  Administered 2014-08-21 – 2014-08-23 (×5): 80 mg via ORAL
  Filled 2014-08-20 (×5): qty 1

## 2014-08-20 MED ORDER — BUDESONIDE-FORMOTEROL FUMARATE 80-4.5 MCG/ACT IN AERO
INHALATION_SPRAY | RESPIRATORY_TRACT | Status: AC
  Filled 2014-08-20: qty 6.9

## 2014-08-20 MED ORDER — ONDANSETRON HCL 4 MG/2ML IJ SOLN
4.0000 mg | Freq: Once | INTRAMUSCULAR | Status: AC
  Administered 2014-08-20: 4 mg via INTRAVENOUS
  Filled 2014-08-20: qty 2

## 2014-08-20 MED ORDER — HEPARIN SODIUM (PORCINE) 5000 UNIT/ML IJ SOLN: 5000.0000 [IU] | Freq: Three times a day (TID) | INTRAMUSCULAR | Status: DC

## 2014-08-20 MED ORDER — ASPIRIN 81 MG PO CHEW
324.0000 mg | CHEWABLE_TABLET | Freq: Once | ORAL | Status: AC
  Administered 2014-08-20: 324 mg via ORAL
  Filled 2014-08-20: qty 4

## 2014-08-20 MED ORDER — ONDANSETRON HCL 4 MG/2ML IJ SOLN
4.0000 mg | Freq: Four times a day (QID) | INTRAMUSCULAR | Status: DC | PRN
  Administered 2014-08-20 – 2014-08-24 (×4): 4 mg via INTRAVENOUS
  Filled 2014-08-20 (×4): qty 2

## 2014-08-20 MED ORDER — QUETIAPINE FUMARATE 25 MG PO TABS
ORAL_TABLET | ORAL | Status: AC
  Filled 2014-08-20: qty 2

## 2014-08-20 MED ORDER — METOPROLOL SUCCINATE ER 25 MG PO TB24
25.0000 mg | ORAL_TABLET | Freq: Every day | ORAL | Status: DC
  Administered 2014-08-21 – 2014-08-24 (×4): 25 mg via ORAL
  Filled 2014-08-20 (×4): qty 1

## 2014-08-20 MED ORDER — PANTOPRAZOLE SODIUM 40 MG PO TBEC
40.0000 mg | DELAYED_RELEASE_TABLET | Freq: Every day | ORAL | Status: DC
  Administered 2014-08-21 – 2014-08-24 (×4): 40 mg via ORAL
  Filled 2014-08-20 (×4): qty 1

## 2014-08-20 MED ORDER — HYDROMORPHONE HCL PF 1 MG/ML IJ SOLN
1.0000 mg | Freq: Once | INTRAMUSCULAR | Status: AC
  Administered 2014-08-20: 1 mg via INTRAVENOUS
  Filled 2014-08-20: qty 1

## 2014-08-20 MED ORDER — MORPHINE SULFATE 2 MG/ML IJ SOLN
2.0000 mg | INTRAMUSCULAR | Status: DC | PRN
  Administered 2014-08-20 – 2014-08-21 (×3): 2 mg via INTRAVENOUS
  Filled 2014-08-20 (×3): qty 1

## 2014-08-20 MED ORDER — ONDANSETRON HCL 4 MG PO TABS
4.0000 mg | ORAL_TABLET | Freq: Four times a day (QID) | ORAL | Status: DC | PRN
  Filled 2014-08-20: qty 1

## 2014-08-20 MED ORDER — APIXABAN 5 MG PO TABS
5.0000 mg | ORAL_TABLET | Freq: Two times a day (BID) | ORAL | Status: DC
  Filled 2014-08-20: qty 1

## 2014-08-20 MED ORDER — QUETIAPINE FUMARATE 25 MG PO TABS
50.0000 mg | ORAL_TABLET | Freq: Two times a day (BID) | ORAL | Status: DC
  Administered 2014-08-20 – 2014-08-21 (×2): 50 mg via ORAL
  Filled 2014-08-20 (×2): qty 1
  Filled 2014-08-20: qty 2
  Filled 2014-08-20 (×2): qty 1

## 2014-08-20 MED ORDER — SODIUM CHLORIDE 0.9 % IV SOLN: INTRAVENOUS | Status: DC

## 2014-08-20 MED ORDER — SODIUM CHLORIDE 0.9 % IV SOLN
INTRAVENOUS | Status: DC
  Administered 2014-08-20 – 2014-08-21 (×3): via INTRAVENOUS

## 2014-08-20 MED ORDER — METHYLPREDNISOLONE SODIUM SUCC 125 MG IJ SOLR
80.0000 mg | Freq: Four times a day (QID) | INTRAMUSCULAR | Status: DC
Start: 2014-08-20 — End: 2014-08-21
  Administered 2014-08-21 (×2): 80 mg via INTRAVENOUS
  Filled 2014-08-20 (×3): qty 2

## 2014-08-20 MED ORDER — AMLODIPINE BESYLATE 5 MG PO TABS
2.5000 mg | ORAL_TABLET | Freq: Every day | ORAL | Status: DC
  Administered 2014-08-21: 2.5 mg via ORAL
  Filled 2014-08-20: qty 1

## 2014-08-20 NOTE — ED Notes (Signed)
Report given to [facility] on 3A for room 313.

## 2014-08-20 NOTE — ED Provider Notes (Signed)
CSN: [id number]     Arrival date & time 08/20/14  1152 History  This chart was scribed for Vanetta Mulders, MD by Milly Jakob, ED Scribe. The patient was seen in room APA14/APA14. Patient's care was started at 2:42 PM.   Chief Complaint  Patient presents with  . Shortness of Breath   Patient is a 48 y.o. female presenting with shortness of breath. The history is provided by the patient. No language interpreter was used.  Shortness of Breath Associated symptoms: cough, fever and headaches   Associated symptoms: no abdominal pain, no chest pain, no neck pain, no rash, no sore throat and no vomiting    HPI Comments: Kim [name] is a 48 y.o. female who presents to the Emergency Department complaining of SOB and cough onset two days ago. She reports her cough is productive of black phlegm like coffee grounds. She reports associated chest pain onset 1 week ago. She reports a history of a blood clot followed by a prescription for Eliquis.She reports having albuterol at home, but she states that she does not take it often. She denies taking steroid medication. She reports a history of PE last time she was seen here.   Cardiologist: Dr. Mayme Genta  Past Medical History  Diagnosis Date  . COPD (chronic obstructive pulmonary disease)     Home O2 3L  . Coronary atherosclerosis of native coronary artery     Cardiac catheterization 07/2012 - LAD 40%; small D2 with 60 to 70% ostial; OM1 30 to 40%; RCA 20%; PL1 40%   . Hypothyroidism   . Hepatitis C   . Pneumonia   . Cirrhosis   . Type 2 diabetes mellitus   . GERD (gastroesophageal reflux disease)   . Headache(784.0)   . Anxiety   . Atrial fibrillation and flutter   . Chronic pain   . PE (pulmonary embolism)   . Chronic anticoagulation   . History of medication noncompliance   . SSS (sick sinus syndrome)     Biotronik pacemaker  . Cardiomyopathy     LVEF 40-45%   Past Surgical History  Procedure Laterality Date  . Cholecystectomy    .  Pacemaker insertion  02/2012    Biotronik PPM   . Cardiac electrophysiology study and ablation    . Esophagogastroduodenoscopy  Jan 2010    Dr. Arlyce Dice: multiple gastric erosions, benign path  . Esophagogastroduodenoscopy N/A 01/10/2014    WUJ:WJXBJYNW EG junction. Query short segment Barrett's esophagus-status post pinch biopsy as described above. Hiatal hernia; otherwise negative EGD   Family History  Problem Relation Age of Onset  . Coronary artery disease Father 49  . Coronary artery disease Mother 3  . Coronary artery disease Brother   . Colon cancer Neg Hx    History  Substance Use Topics  . Smoking status: Current Every Day Smoker -- 0.50 packs/day for 30 years    Types: Cigarettes  . Smokeless tobacco: Current User    Types: Snuff  . Alcohol Use: No   OB History   Grav Para Term Preterm Abortions TAB SAB Ect Mult Living                 Review of Systems  Constitutional: Positive for fever and chills.  HENT: Positive for congestion and rhinorrhea. Negative for sore throat.   Eyes: Negative for visual disturbance.  Respiratory: Positive for cough and shortness of breath.   Cardiovascular: Negative for chest pain and leg swelling.  Gastrointestinal: Negative for nausea,  vomiting, abdominal pain and diarrhea.  Genitourinary: Positive for decreased urine volume. Negative for dysuria and difficulty urinating.  Musculoskeletal: Positive for back pain. Negative for neck pain.  Skin: Negative for rash.  Neurological: Positive for headaches. Negative for dizziness and light-headedness.  Hematological: Does not bruise/bleed easily.  Psychiatric/Behavioral: Negative for confusion.   Allergies  Nitroglycerin; Doxycycline; Flexeril; Ibuprofen; Tramadol; Tylenol; Vesicare; Hydralazine; and Amoxil  Home Medications   Prior to Admission medications   Medication Sig Start Date End Date Taking? Authorizing Provider  albuterol (PROVENTIL HFA;VENTOLIN HFA) 108 (90 BASE) MCG/ACT  inhaler Inhale 2 puffs into the lungs every 6 (six) hours as needed for wheezing or shortness of breath.     Historical Provider, MD  amLODipine (NORVASC) 2.5 MG tablet Take 1 tablet (2.5 mg total) by mouth daily. 07/01/14   Standley Brooking, MD  apixaban (ELIQUIS) 5 MG TABS tablet Take 1 tablet (5 mg total) by mouth 2 (two) times daily. 07/24/14   Erick Blinks, MD  atorvastatin (LIPITOR) 20 MG tablet Take 20 mg by mouth daily.    Historical Provider, MD  budesonide-formoterol (SYMBICORT) 80-4.5 MCG/ACT inhaler Inhale 2 puffs into the lungs 2 (two) times daily.    Historical Provider, MD  furosemide (LASIX) 80 MG tablet Take 1 tablet (80 mg total) by mouth 2 (two) times daily. 06/14/14   Standley Brooking, MD  HYDROcodone-acetaminophen (NORCO/VICODIN) 5-325 MG per tablet Take 1 tablet by mouth every 4 (four) hours as needed. 07/27/14   Kathie Dike, PA-C  metoprolol succinate (TOPROL-XL) 25 MG 24 hr tablet Take 1 tablet (25 mg total) by mouth daily. 06/14/14   Gwenyth Bender, NP  pantoprazole (PROTONIX) 40 MG tablet Take 40 mg by mouth daily.    Historical Provider, MD  potassium chloride SA (K-DUR,KLOR-CON) 20 MEQ tablet Take 1 tablet (20 mEq total) by mouth daily. 06/07/14   Lyanne Co, MD  predniSONE (DELTASONE) 10 MG tablet Take  po daily for 2 days, then  po daily for 2 days then  po daily for 2 days then  po daily for 2 days then stop 07/24/14   Erick Blinks, MD  QUEtiapine (SEROQUEL) 25 MG tablet Take 50 mg by mouth 2 (two) times daily. 12/08/13   Historical Provider, MD  tiotropium (SPIRIVA) 18 MCG inhalation capsule Place 18 mcg into inhaler and inhale daily.    Historical Provider, MD   Triage Vitals: BP 147/88  Pulse 82  Temp(Src) 98.9 F (37.2 C) (Oral)  Resp 26  Ht  (1.6 m)  Wt 250 lb (113.399 kg)  BMI 44.30 kg/m2  SpO2 95%  LMP 02/08/2007 Physical Exam  Nursing note and vitals reviewed. Constitutional: She is oriented to person, place, and time. She appears  well-developed and well-nourished. No distress.  HENT:  Head: Normocephalic and atraumatic.  Eyes: Conjunctivae and EOM are normal.  Neck: Neck supple. No tracheal deviation present.  Cardiovascular: Normal rate, regular rhythm and normal heart sounds.   No murmur heard. Pulmonary/Chest: Effort normal. No respiratory distress. She has wheezes. She has rales (bilateral).  Musculoskeletal: Normal range of motion.  Neurological: She is alert and oriented to person, place, and time.  Skin: Skin is warm and dry.  Psychiatric: She has a normal mood and affect. Her behavior is normal.    ED Course  Procedures (including critical care time) DIAGNOSTIC STUDIES: Oxygen Saturation is 98% on O2 2L, normal by my interpretation.    COORDINATION OF CARE: 2:50 PM-Discussed treatment plan  with pt at bedside and pt agreed to plan.   Labs Review Labs Reviewed  TROPONIN I - Abnormal; Notable for the following:    Troponin I 0.57 (*)    All other components within normal limits  CBC WITH DIFFERENTIAL - Abnormal; Notable for the following:    MCV 103.6 (*)    All other components within normal limits  PRO B NATRIURETIC PEPTIDE - Abnormal; Notable for the following:    Pro B Natriuretic peptide (BNP) 1380.0 (*)    All other components within normal limits  BASIC METABOLIC PANEL  PROTIME-INR   Results for orders placed during the hospital encounter of 08/20/14  TROPONIN I      Result Value Ref Range   Troponin I 0.57 (*) <0.30 ng/mL  BASIC METABOLIC PANEL      Result Value Ref Range   Sodium 141  137 - 147 mEq/L   Potassium 4.4  3.7 - 5.3 mEq/L   Chloride 101  96 - 112 mEq/L   CO2 30  19 - 32 mEq/L   Glucose, Bld 97  70 - 99 mg/dL   BUN 8  6 - 23 mg/dL   Creatinine, Ser 2.06  0.50 - 1.10 mg/dL   Calcium 8.9  8.4 - 01.5 mg/dL   GFR calc non Af Amer >90  >90 mL/min   GFR calc Af Amer >90  >90 mL/min   Anion gap 10  5 - 15  CBC WITH DIFFERENTIAL      Result Value Ref Range   WBC 7.6  4.0  - 10.5 K/uL   RBC 3.94  3.87 - 5.11 MIL/uL   Hemoglobin 13.2  12.0 - 15.0 g/dL   HCT 61.5  37.9 - 43.2 %   MCV 103.6 (*) 78.0 - 100.0 fL   MCH 33.5  26.0 - 34.0 pg   MCHC 32.4  30.0 - 36.0 g/dL   RDW 76.1  47.0 - 92.9 %   Platelets 196  150 - 400 K/uL   Neutrophils Relative % 64  43 - 77 %   Neutro Abs 4.9  1.7 - 7.7 K/uL   Lymphocytes Relative 23  12 - 46 %   Lymphs Abs 1.7  0.7 - 4.0 K/uL   Monocytes Relative 10  3 - 12 %   Monocytes Absolute 0.8  0.1 - 1.0 K/uL   Eosinophils Relative 3  0 - 5 %   Eosinophils Absolute 0.2  0.0 - 0.7 K/uL   Basophils Relative 0  0 - 1 %   Basophils Absolute 0.0  0.0 - 0.1 K/uL  PROTIME-INR      Result Value Ref Range   Prothrombin Time 14.0  11.6 - 15.2 seconds   INR 1.08  0.00 - 1.49  PRO B NATRIURETIC PEPTIDE      Result Value Ref Range   Pro B Natriuretic peptide (BNP) 1380.0 (*) 0 - 125 pg/mL    Imaging Review Dg Chest 2 View  08/20/2014   CLINICAL DATA:  Shortness of breath, chest pain.  EXAM: CHEST  2 VIEW  COMPARISON:  July 24, 2014.  FINDINGS: The heart size and mediastinal contours are within normal limits. Both lungs are clear. Left-sided pacemaker is unchanged in position. No pneumothorax or pleural effusion is noted. The visualized skeletal structures are unremarkable.  IMPRESSION: No acute cardiopulmonary abnormality seen.   Electronically Signed   By: Roque Lias M.D.   On: 08/20/2014 16:31     EKG Interpretation  Date/Time:  Monday August 20 2014 17:03:06 EDT Ventricular Rate:  80 PR Interval:  69 QRS Duration: 173 QT Interval:  484 QTC Calculation: 558 R Axis:   -87 Text Interpretation:  Sinus rhythm Ventricular premature complex Short PR  interval Right bundle branch block LVH with IVCD and secondary repol abnrm  Prolonged QT interval Baseline wander in lead(s) V3 Ventricular-paced  rhythm Confirmed by Frederich Montilla  MD, Ladene Allocca ([id number]) on 08/20/2014 5:07:12 PM      MDM   Final diagnoses:  COPD with acute  exacerbation  Chest pain, unspecified chest pain type    Patient presented for 2 day history of increased shortness of breath and cough patient's known to have COPD. Patient with some wheezing upon presentation but also sounded as if she could have rales. Chest x-ray negative for any acute cardiac findings. Patient also with the complaint of chest pain on and off for the past week. Troponin is elevated. Review of past admissions in presentations patient's troponins always been elevated. Patient does have a pacemaker patient has no specific nodes in by cardiology. Her current cardiologist is in [place] area.  Discussed with the hospitalist. The patient given aspirin do not think this is necessary an unstable angina picture of not starting heparin at this point in time. But we are keeping that open as an option. Patient's wheezing improved with nebulizer treatment.     I personally performed the services described in this documentation, which was scribed in my presence. The recorded information has been reviewed and is accurate.     Vanetta Mulders, MD 08/21/14 [phone]

## 2014-08-20 NOTE — ED Notes (Signed)
MD at bedside. 

## 2014-08-20 NOTE — Progress Notes (Signed)
Patient states she doesn't take eliquis anymore due to nausea and vomiting. Patient states before eliquis she was on coumadin. Patient states "I put myself back on coumadin." States she takes 4mg  once a day at home. She does not have a PCP. Will give patient info on MDs in surrounding community.

## 2014-08-20 NOTE — ED Notes (Signed)
Kim [name] on 3A stated she couldn't get report at this time due to in a room and would call back.

## 2014-08-20 NOTE — H&P (Addendum)
[name] History and Physical  Kim [name] NWG:[id number] DOB: 1966/07/04 DOA: 08/20/2014  Referring physician: ER PCP: No PCP Per Patient   Chief Complaint: Dyspnea, and chest pain.  HPI: Kim [name] is a 48 y.o. female  This is a 48 year old lady who comes in complaining of dyspnea and chest pain which has been going on for about 2 days now. She does have a history of chest pain previously with elevated troponin levels. She does have a low risk Cardiolite stress test previously. She has not had cardiac catheterization. She also has a cough which is productive of blackish sputum, according to her history. She is on home oxygen apparently. She is now being admitted for further management.   Review of Systems:  Constitutional:  No weight loss, night sweats, Fevers, chills, fatigue.  HEENT:  No headaches, Difficulty swallowing,Tooth/dental problems,Sore throat,  No sneezing, itching, ear ache, nasal congestion, post nasal drip,   GI:  No heartburn, indigestion, abdominal pain, nausea, vomiting, diarrhea, change in bowel habits, loss of appetite    Skin:  no rash or lesions.  GU:  no dysuria, change in color of urine, no urgency or frequency. No flank pain.  Musculoskeletal:  No joint pain or swelling. No decreased range of motion. No back pain.  Psych:  No change in mood or affect. No depression or anxiety. No memory loss.   Past Medical History  Diagnosis Date  . COPD (chronic obstructive pulmonary disease)     Home O2 3L  . Coronary atherosclerosis of native coronary artery     Cardiac catheterization 07/2012 - LAD 40%; small D2 with 60 to 70% ostial; OM1 30 to 40%; RCA 20%; PL1 40%   . Hypothyroidism   . Hepatitis C   . Pneumonia   . Cirrhosis   . Type 2 diabetes mellitus   . GERD (gastroesophageal reflux disease)   . Headache(784.0)   . Anxiety   . Atrial fibrillation and flutter   . Chronic pain   . PE (pulmonary embolism)   . Chronic anticoagulation    . History of medication noncompliance   . SSS (sick sinus syndrome)     Biotronik pacemaker  . Cardiomyopathy     LVEF 40-45%   Past Surgical History  Procedure Laterality Date  . Cholecystectomy    . Pacemaker insertion  02/2012    Biotronik PPM   . Cardiac electrophysiology study and ablation    . Esophagogastroduodenoscopy  Jan 2010    Dr. Arlyce Dice: multiple gastric erosions, benign path  . Esophagogastroduodenoscopy N/A 01/10/2014    VHQ:IONGEXBM EG junction. Query short segment Barrett's esophagus-status post pinch biopsy as described above. Hiatal hernia; otherwise negative EGD   Social History:  reports that she has been smoking Cigarettes.  She has a 15 pack-year smoking history. Her smokeless tobacco use includes Snuff. She reports that she does not drink alcohol or use illicit drugs.  Allergies  Allergen Reactions  . Nitroglycerin Other (See Comments)    CAUSED NUMBNESS ALL OVER  . Doxycycline Itching  . Flexeril [Cyclobenzaprine] Other (See Comments)    Sweating, Lightheaded   . Ibuprofen Other (See Comments)    Increase BP, dizziness, lightheaded  . Tramadol Itching  . Tylenol [Acetaminophen] Other (See Comments)    Pt has Hep C  . Vesicare [Solifenacin] Other (See Comments)    Makes my sweat turn yellow  . Hydralazine     Altered mental status, hallucinations, angression  . Amoxil [Amoxicillin] Nausea Only  Family History  Problem Relation Age of Onset  . Coronary artery disease Father 37  . Coronary artery disease Mother 24  . Coronary artery disease Brother   . Colon cancer Neg Hx      Prior to Admission medications   Medication Sig Start Date End Date Taking? Authorizing Provider  albuterol (PROVENTIL HFA;VENTOLIN HFA) 108 (90 BASE) MCG/ACT inhaler Inhale 2 puffs into the lungs every 6 (six) hours as needed for wheezing or shortness of breath.    Yes Historical Provider, MD  amLODipine (NORVASC) 2.5 MG tablet Take 1 tablet (2.5 mg total) by mouth  daily. 07/01/14  Yes Standley Brooking, MD  apixaban (ELIQUIS) 5 MG TABS tablet Take 1 tablet (5 mg total) by mouth 2 (two) times daily. 07/24/14  Yes Erick Blinks, MD  budesonide-formoterol (SYMBICORT) 80-4.5 MCG/ACT inhaler Inhale 2 puffs into the lungs 2 (two) times daily.   Yes Historical Provider, MD  furosemide (LASIX) 80 MG tablet Take 1 tablet (80 mg total) by mouth 2 (two) times daily. 06/14/14  Yes Standley Brooking, MD  metoprolol succinate (TOPROL-XL) 25 MG 24 hr tablet Take 1 tablet (25 mg total) by mouth daily. 06/14/14  Yes Lesle Chris Black, NP  pantoprazole (PROTONIX) 40 MG tablet Take 40 mg by mouth daily.   Yes Historical Provider, MD  QUEtiapine (SEROQUEL) 25 MG tablet Take 50 mg by mouth 2 (two) times daily. 12/08/13  Yes Historical Provider, MD  tiotropium (SPIRIVA) 18 MCG inhalation capsule Place 18 mcg into inhaler and inhale daily.   Yes Historical Provider, MD  warfarin (COUMADIN) 4 MG tablet Take 4 mg by mouth daily.   Yes Historical Provider, MD   Physical Exam: Filed Vitals:   08/20/14 1630 08/20/14 1716 08/20/14 1730 08/20/14 1800  BP: 132/74 141/78 144/98 138/86  Pulse: 80 83 85 85  Temp:      TempSrc:      Resp: 18 18 11 16   Height:      Weight:      SpO2: 96% 98% 93% 96%    Wt Readings from Last 3 Encounters:  08/20/14 113.399 kg (250 lb)  07/27/14 113.399 kg (250 lb)  07/24/14 109.9 kg (242 lb 4.6 oz)    General:  Appears calm and comfortable Eyes: PERRL, normal lids, irises & conjunctiva ENT: grossly normal hearing, lips & tongue Neck: no LAD, masses or thyromegaly Cardiovascular: RRR, no m/r/g. No LE edema. Telemetry: SR, no arrhythmias  Respiratory: Bilateral wheezing, not tight. Abdomen: soft, ntnd Skin: no rash or induration seen on limited exam Musculoskeletal: grossly normal tone BUE/BLE Psychiatric: grossly normal mood and affect, speech fluent and appropriate Neurologic: grossly non-focal.          Labs on Admission:  Basic Metabolic  Panel:  Recent Labs Lab 08/20/14 1540  NA 141  K 4.4  CL 101  CO2 30  GLUCOSE 97  BUN 8  CREATININE 0.59  CALCIUM 8.9       CBC:  Recent Labs Lab 08/20/14 1540  WBC 7.6  NEUTROABS 4.9  HGB 13.2  HCT 40.8  MCV 103.6*  PLT 196   Cardiac Enzymes:  Recent Labs Lab 08/20/14 1540  TROPONINI 0.57*    BNP (last 3 results)  Recent Labs  07/21/14 2100 07/23/14 1509 08/20/14 1540  PROBNP 2436.0* 1639.0* 1380.0*   CBG: No results found for this basename: GLUCAP,  in the last 168 hours  Radiological Exams on Admission: Dg Chest 2 View  08/20/2014   CLINICAL  DATA:  Shortness of breath, chest pain.  EXAM: CHEST  2 VIEW  COMPARISON:  July 24, 2014.  FINDINGS: The heart size and mediastinal contours are within normal limits. Both lungs are clear. Left-sided pacemaker is unchanged in position. No pneumothorax or pleural effusion is noted. The visualized skeletal structures are unremarkable.  IMPRESSION: No acute cardiopulmonary abnormality seen.   Electronically Signed   By: Roque Lias M.D.   On: 08/20/2014 16:31    EKG: Independently reviewed. Appears to be in sinus rhythm with right bundle branch block pattern. No acute ST-T wave changes.  Assessment/Plan   1. COPD exacerbation. 2. Chest pain with elevated troponin level.  Plan: 1. Admit to telemetry. 2. Serial cardiac enzymes. 3. Cardiology consultation. 4. Intravenous steroids.  Further recommendations will depend on patient's hospital progress.   Code Status: Full code.    Family Communication: I discussed the plan with patient at the bedside.   Disposition Plan: Home when medically stable.  Time spent: 60 minutes.  Wilson Singer [name] Pager [phone].  **Disclaimer: This note may have been dictated with voice recognition software. Similar sounding words can inadvertently be transcribed and this note may contain transcription errors which may not have been corrected upon  publication of note.**

## 2014-08-20 NOTE — ED Notes (Signed)
Sob , with cough  For 2 days, On home 02,  "feels like I have a lot of fld built up"

## 2014-08-20 NOTE — ED Notes (Signed)
Pt in NAD in waiting. Speaking in complete sentences with family.

## 2014-08-20 NOTE — ED Notes (Signed)
CRITICAL VALUE ALERT  Critical value received:  Troponin 0.57  Date of notification:  08/20/14  Time of notification:  1636  Critical value read back:Yes.    Nurse who received alert:  RMinterRN  MD notified (1st page):  Dr. Deretha Emory  Time of first page:  1636  MD notified (2nd page):  Time of second page:  Responding MD:  Er md  Time MD responded:  [phone]

## 2014-08-20 NOTE — ED Notes (Signed)
SpO2 97% on RA in Triage.

## 2014-08-21 DIAGNOSIS — J9610 Chronic respiratory failure, unspecified whether with hypoxia or hypercapnia: Secondary | ICD-10-CM

## 2014-08-21 DIAGNOSIS — Z7901 Long term (current) use of anticoagulants: Secondary | ICD-10-CM

## 2014-08-21 DIAGNOSIS — R0989 Other specified symptoms and signs involving the circulatory and respiratory systems: Secondary | ICD-10-CM

## 2014-08-21 DIAGNOSIS — K089 Disorder of teeth and supporting structures, unspecified: Secondary | ICD-10-CM

## 2014-08-21 DIAGNOSIS — E669 Obesity, unspecified: Secondary | ICD-10-CM

## 2014-08-21 DIAGNOSIS — R739 Hyperglycemia, unspecified: Secondary | ICD-10-CM | POA: Diagnosis present

## 2014-08-21 DIAGNOSIS — I2510 Atherosclerotic heart disease of native coronary artery without angina pectoris: Secondary | ICD-10-CM

## 2014-08-21 DIAGNOSIS — I209 Angina pectoris, unspecified: Secondary | ICD-10-CM

## 2014-08-21 DIAGNOSIS — F341 Dysthymic disorder: Secondary | ICD-10-CM

## 2014-08-21 DIAGNOSIS — F17200 Nicotine dependence, unspecified, uncomplicated: Secondary | ICD-10-CM

## 2014-08-21 DIAGNOSIS — Z950 Presence of cardiac pacemaker: Secondary | ICD-10-CM

## 2014-08-21 DIAGNOSIS — R0609 Other forms of dyspnea: Secondary | ICD-10-CM

## 2014-08-21 LAB — COMPREHENSIVE METABOLIC PANEL
ALK PHOS: 80 U/L (ref 39–117)
ALT: 30 U/L (ref 0–35)
AST: 43 U/L — ABNORMAL HIGH (ref 0–37)
Albumin: 3.3 g/dL — ABNORMAL LOW (ref 3.5–5.2)
Anion gap: 9 (ref 5–15)
BUN: 8 mg/dL (ref 6–23)
CO2: 30 meq/L (ref 19–32)
Calcium: 8.5 mg/dL (ref 8.4–10.5)
Chloride: 101 mEq/L (ref 96–112)
Creatinine, Ser: 0.58 mg/dL (ref 0.50–1.10)
GFR calc Af Amer: >90 mL/min (ref >90)
Glucose, Bld: 190 mg/dL — ABNORMAL HIGH (ref 70–99)
POTASSIUM: 4.4 meq/L (ref 3.7–5.3)
SODIUM: 140 meq/L (ref 137–147)
TOTAL PROTEIN: 7.2 g/dL (ref 6.0–8.3)
Total Bilirubin: 0.7 mg/dL (ref 0.3–1.2)

## 2014-08-21 LAB — CBC
HCT: 39.5 % (ref 36.0–46.0)
Hemoglobin: 12.8 g/dL (ref 12.0–15.0)
MCH: 33.8 pg (ref 26.0–34.0)
MCHC: 32.4 g/dL (ref 30.0–36.0)
MCV: 104.2 fL — AB (ref 78.0–100.0)
Platelets: 162 10*3/uL (ref 150–400)
RBC: 3.79 MIL/uL — AB (ref 3.87–5.11)
RDW: 14.5 % (ref 11.5–15.5)
WBC: 4.5 10*3/uL (ref 4.0–10.5)

## 2014-08-21 LAB — BLOOD GAS, ARTERIAL
Acid-Base Excess: 5 mmol/L — ABNORMAL HIGH (ref 0.0–2.0)
BICARBONATE: 30.6 meq/L — AB (ref 20.0–24.0)
Drawn by: 234301
O2 Content: 2 L/min
O2 Saturation: 96.3 %
PCO2 ART: 59.4 mmHg — AB (ref 35.0–45.0)
PH ART: 7.332 — AB (ref 7.350–7.450)
Patient temperature: 37
TCO2: 27.6 mmol/L (ref 0–100)
pO2, Arterial: 93.6 mmHg (ref 80.0–100.0)

## 2014-08-21 LAB — GLUCOSE, CAPILLARY
GLUCOSE-CAPILLARY: 298 mg/dL — AB (ref 70–99)
GLUCOSE-CAPILLARY: 325 mg/dL — AB (ref 70–99)
Glucose-Capillary: 403 mg/dL — ABNORMAL HIGH (ref 70–99)
Glucose-Capillary: 249 mg/dL — ABNORMAL HIGH (ref 70–99)

## 2014-08-21 LAB — MRSA PCR SCREENING: MRSA by PCR: POSITIVE — AB

## 2014-08-21 LAB — TROPONIN I
Troponin I: 0.45 ng/mL (ref <0.30)
Troponin I: 0.41 ng/mL (ref <0.30)

## 2014-08-21 LAB — HEMOGLOBIN A1C
Hgb A1c MFr Bld: 5.8 % — ABNORMAL HIGH (ref <5.7)
Mean Plasma Glucose: 120 mg/dL — ABNORMAL HIGH (ref <117)

## 2014-08-21 LAB — TSH: TSH: 1.55 u[IU]/mL (ref 0.350–4.500)

## 2014-08-21 MED ORDER — AMLODIPINE BESYLATE 5 MG PO TABS
2.5000 mg | ORAL_TABLET | Freq: Once | ORAL | Status: AC
  Administered 2014-08-21: 2.5 mg via ORAL
  Filled 2014-08-21: qty 1

## 2014-08-21 MED ORDER — INSULIN ASPART 100 UNIT/ML ~~LOC~~ SOLN
0.0000 [IU] | Freq: Three times a day (TID) | SUBCUTANEOUS | Status: DC
  Administered 2014-08-22: 15 [IU] via SUBCUTANEOUS
  Administered 2014-08-22: 11 [IU] via SUBCUTANEOUS
  Administered 2014-08-22: 4 [IU] via SUBCUTANEOUS
  Administered 2014-08-23 (×2): 11 [IU] via SUBCUTANEOUS
  Administered 2014-08-23: 7 [IU] via SUBCUTANEOUS
  Administered 2014-08-24: 20 [IU] via SUBCUTANEOUS
  Administered 2014-08-24: 4 [IU] via SUBCUTANEOUS

## 2014-08-21 MED ORDER — WARFARIN - PHARMACIST DOSING INPATIENT
Status: DC
  Administered 2014-08-21: 1

## 2014-08-21 MED ORDER — LEVOFLOXACIN IN D5W 750 MG/150ML IV SOLN
750.0000 mg | INTRAVENOUS | Status: DC
  Administered 2014-08-21: 750 mg via INTRAVENOUS
  Filled 2014-08-21: qty 150

## 2014-08-21 MED ORDER — MUPIROCIN 2 % EX OINT
1.0000 "application " | TOPICAL_OINTMENT | Freq: Two times a day (BID) | CUTANEOUS | Status: DC
  Administered 2014-08-21 – 2014-08-24 (×7): 1 via NASAL
  Filled 2014-08-21 (×3): qty 22

## 2014-08-21 MED ORDER — INSULIN ASPART 100 UNIT/ML ~~LOC~~ SOLN
10.0000 [IU] | Freq: Once | SUBCUTANEOUS | Status: AC
  Administered 2014-08-21: 10 [IU] via SUBCUTANEOUS

## 2014-08-21 MED ORDER — INSULIN ASPART 100 UNIT/ML ~~LOC~~ SOLN
0.0000 [IU] | Freq: Every day | SUBCUTANEOUS | Status: DC
Start: 2014-08-21 — End: 2014-08-24
  Administered 2014-08-21: 2 [IU] via SUBCUTANEOUS
  Administered 2014-08-22: 5 [IU] via SUBCUTANEOUS
  Administered 2014-08-23: 4 [IU] via SUBCUTANEOUS

## 2014-08-21 MED ORDER — INSULIN ASPART 100 UNIT/ML ~~LOC~~ SOLN: 0.0000 [IU] | Freq: Three times a day (TID) | SUBCUTANEOUS | Status: DC

## 2014-08-21 MED ORDER — INSULIN ASPART 100 UNIT/ML ~~LOC~~ SOLN: 0.0000 [IU] | Freq: Every day | SUBCUTANEOUS | Status: DC

## 2014-08-21 MED ORDER — CETYLPYRIDINIUM CHLORIDE 0.05 % MT LIQD
7.0000 mL | Freq: Two times a day (BID) | OROMUCOSAL | Status: DC
  Administered 2014-08-22 – 2014-08-24 (×5): 7 mL via OROMUCOSAL

## 2014-08-21 MED ORDER — ALBUTEROL SULFATE (2.5 MG/3ML) 0.083% IN NEBU: 2.5000 mg | INHALATION_SOLUTION | RESPIRATORY_TRACT | Status: DC | PRN

## 2014-08-21 MED ORDER — DIPHENHYDRAMINE HCL 25 MG PO CAPS
25.0000 mg | ORAL_CAPSULE | Freq: Three times a day (TID) | ORAL | Status: DC | PRN
  Filled 2014-08-21: qty 1

## 2014-08-21 MED ORDER — HYDROCODONE-ACETAMINOPHEN 5-325 MG PO TABS: 1.0000 | ORAL_TABLET | Freq: Four times a day (QID) | ORAL | Status: DC | PRN

## 2014-08-21 MED ORDER — QUETIAPINE FUMARATE 25 MG PO TABS: 50.0000 mg | ORAL_TABLET | Freq: Every day | ORAL | Status: DC

## 2014-08-21 MED ORDER — AMLODIPINE BESYLATE 5 MG PO TABS
5.0000 mg | ORAL_TABLET | Freq: Every day | ORAL | Status: DC
  Administered 2014-08-22 – 2014-08-24 (×3): 5 mg via ORAL
  Filled 2014-08-21 (×3): qty 1

## 2014-08-21 MED ORDER — RIVAROXABAN 20 MG PO TABS
20.0000 mg | ORAL_TABLET | Freq: Every day | ORAL | Status: DC
  Administered 2014-08-21: 20 mg via ORAL
  Filled 2014-08-21 (×2): qty 1

## 2014-08-21 MED ORDER — INSULIN ASPART 100 UNIT/ML ~~LOC~~ SOLN
25.0000 [IU] | Freq: Once | SUBCUTANEOUS | Status: AC
  Administered 2014-08-21: 25 [IU] via SUBCUTANEOUS

## 2014-08-21 MED ORDER — WARFARIN SODIUM 5 MG PO TABS
7.5000 mg | ORAL_TABLET | Freq: Once | ORAL | Status: AC
  Administered 2014-08-21: 7.5 mg via ORAL
  Filled 2014-08-21: qty 2

## 2014-08-21 MED ORDER — NALOXONE HCL 0.4 MG/ML IJ SOLN: 0.4000 mg | INTRAMUSCULAR | Status: DC | PRN

## 2014-08-21 MED ORDER — LEVOFLOXACIN 500 MG PO TABS
500.0000 mg | ORAL_TABLET | Freq: Every day | ORAL | Status: DC
  Administered 2014-08-22 – 2014-08-24 (×3): 500 mg via ORAL
  Filled 2014-08-21 (×3): qty 1

## 2014-08-21 MED ORDER — CHLORHEXIDINE GLUCONATE CLOTH 2 % EX PADS
6.0000 | MEDICATED_PAD | Freq: Every day | CUTANEOUS | Status: DC
  Administered 2014-08-21 – 2014-08-24 (×4): 6 via TOPICAL

## 2014-08-21 MED ORDER — METHYLPREDNISOLONE SODIUM SUCC 125 MG IJ SOLR
60.0000 mg | Freq: Three times a day (TID) | INTRAMUSCULAR | Status: DC
  Administered 2014-08-21: 60 mg via INTRAVENOUS

## 2014-08-21 NOTE — Progress Notes (Signed)
Dr. Kerry Hough aware of patients elevated blood sugar

## 2014-08-21 NOTE — Progress Notes (Signed)
Discussed with Toya Smothers NP patients refusal of Eliquist and that she has been taking her old rx of coumadin at home.

## 2014-08-21 NOTE — Clinical Social Work Psychosocial (Signed)
Clinical Social Work Department BRIEF PSYCHOSOCIAL ASSESSMENT 08/21/2014  Patient:  Kim [name], Kim [name]     Account Number:  [id number]     Admit date:  08/20/2014  Clinical Social Worker:  Edwyna Shell, [place]  Date/Time:  08/21/2014 04:30 PM  Referred by:  Physician  Date Referred:  08/21/2014 Referred for  Behavioral Health Issues   Other Referral:   Interview type:  Patient Other interview type:    PSYCHOSOCIAL DATA Living Status:  FAMILY Admitted from facility:   Level of care:   Primary support name:  Talmadge Coventry Primary support relationship to patient:  CHILD, ADULT Degree of support available:   Patient does not feel supported by son in home, has multiple stressors including low income, disability, lack of transportation, lack of family support    CURRENT CONCERNS Current Concerns  [place]   Other Concerns:    [place] / PLAN CSW met w patient at bedside, patient disheveled and somewhat difficult to understand, guarded in responses. Patient receives SSI disability income for "medical" and "mental health" reasons.  Patient endorses anxiety and states that she has been on Xanax in past.   Says she has been depressed but ADPS "dont help at all."    Patient pays half rent and utilities on home - s-on who also receives disability also lives there.  Son has allowed girlfriend, her parents and children to live there "temporari-ly"; however, patient is upset this has lasted beyond the 3 month time period agreed upon.    Patient says she is unable to get medications as outpatient, says she does not drive.  Gives sn money to get her meds, but son does not get them. Patient says "he lies" - does not tell her where he is going and may be misusing money patient has given him for other purposes.  Patient is unwilling to confront son, needs his help w driving and getting to appointments but cannot set limits w him.    Patient has no PCP  in [place], last MD was in [place] [facility]).  Patient has been unable to establish in care locally.    CSW spoke w RN CM, patient has been referred several times to [facility] clinic but has not kept appts.  Patient has Medicaid.  CSW suggested referral to [facility] for additional support for coordinating outpatient medical needs.  Patient declined referral to [facility] for outpatient mental health care.   Assessment/plan status:  Psychosocial Support/Ongoing Assessment of Needs Other assessment/ plan:   Information/referral to community resources:   [facility]    PATIENT'S/FAMILY'S RESPONSE TO PLAN OF CARE: Patient is most concerned about referral to cardiologist, does not want to keep returning to hospital, but has not been able to get her medications as outpatient        Edwyna Shell, [place] Worker [phone])

## 2014-08-21 NOTE — Progress Notes (Signed)
CRITICAL VALUE ALERT  Critical value received:  ABG results   Date of notification:  09/15/215  Time of notification:  1740  Critical value read back: yes  Nurse who received alert:  Onnie Boer   MD notified (1st page):  Physician on floor and notified- Dr Kerry Hough  Time of first page:  1740  MD notified (2nd page):  Time of second page:  Responding MD:  Dr. Kerry Hough  Time MD responded: [phone]

## 2014-08-21 NOTE — Progress Notes (Signed)
Patient showed 14 beat run of Vtach on monitor nurse in room and had just woke patient up from nap. Patient asymptomatic. Vital signs stable. Toya Smothers FNP notified.

## 2014-08-21 NOTE — Progress Notes (Signed)
ANTIBIOTIC CONSULT NOTE - INITIAL  Pharmacy Consult for Levaquin Indication: COPD exacerbation  Allergies  Allergen Reactions  . Nitroglycerin Other (See Comments)    CAUSED NUMBNESS ALL OVER  . Doxycycline Itching  . Flexeril [Cyclobenzaprine] Other (See Comments)    Sweating, Lightheaded   . Ibuprofen Other (See Comments)    Increase BP, dizziness, lightheaded  . Tramadol Itching  . Tylenol [Acetaminophen] Other (See Comments)    Pt has Hep C  . Vesicare [Solifenacin] Other (See Comments)    Makes my sweat turn yellow  . Hydralazine     Altered mental status, hallucinations, angression  . Amoxil [Amoxicillin] Nausea Only   Patient Measurements: Height: 5\' 3"  (160 cm) Weight: 250 lb (113.399 kg) IBW/kg (Calculated) : 52.4  Vital Signs: Temp: 97.8 F (36.6 C) (09/15 0506) Temp src: Oral (09/15 0506) BP: 114/61 mmHg (09/15 1045) Pulse Rate: 71 (09/15 1045) Intake/Output from previous day: 09/14 0701 - 09/15 0700 In: 1153.3 [P.O.:480; I.V.:673.3] Out: -  Intake/Output from this shift: Total I/O In: 240 [P.O.:240] Out: -   Labs:  Recent Labs  08/20/14 1540 08/21/14 0620  WBC 7.6 4.5  HGB 13.2 12.8  PLT 196 162  CREATININE 0.59 0.58   Estimated Creatinine Clearance: 103.1 ml/min (by C-G formula based on Cr of 0.58). No results found for this basename: VANCOTROUGH, Leodis Binet, VANCORANDOM, GENTTROUGH, GENTPEAK, GENTRANDOM, TOBRATROUGH, TOBRAPEAK, TOBRARND, AMIKACINPEAK, AMIKACINTROU, AMIKACIN,  in the last 72 hours   Microbiology: Recent Results (from the past 720 hour(s))  BODY FLUID CULTURE     Status: None   Collection Time    07/24/14 10:40 AM      Result Value Ref Range Status   Specimen Description FLUID RIGHT LUNG   Final   Special Requests NONE   Final   Gram Stain     Final   Value: RARE WBC PRESENT, PREDOMINANTLY MONONUCLEAR     NO ORGANISMS SEEN     Performed at [facility]   Culture     Final   Value: NO GROWTH 3 DAYS     Performed  at [facility]   Report Status 07/28/2014 FINAL   Final  MRSA PCR SCREENING     Status: Abnormal   Collection Time    08/21/14 12:30 AM      Result Value Ref Range Status   MRSA by PCR POSITIVE (*) NEGATIVE Final   Comment:            The GeneXpert MRSA Assay (FDA     approved for NASAL specimens     only), is one component of a     comprehensive MRSA colonization     surveillance program. It is not     intended to diagnose MRSA     infection nor to guide or     monitor treatment for     MRSA infections.     RESULT CALLED TO, READ BACK BY AND VERIFIED WITH:     [place] Sizer [id number] BY FORSYTH K   Medical History: Past Medical History  Diagnosis Date  . COPD (chronic obstructive pulmonary disease)     Home O2 3L  . Coronary atherosclerosis of native coronary artery     Cardiac catheterization 07/2012 - LAD 40%; small D2 with 60 to 70% ostial; OM1 30 to 40%; RCA 20%; PL1 40%   . Hypothyroidism   . Hepatitis C   . Pneumonia   . Cirrhosis   . Type 2 diabetes  mellitus   . GERD (gastroesophageal reflux disease)   . Headache(784.0)   . Anxiety   . Atrial fibrillation and flutter   . Chronic pain   . PE (pulmonary embolism)   . Chronic anticoagulation   . History of medication noncompliance   . SSS (sick sinus syndrome)     Biotronik pacemaker  . Cardiomyopathy     LVEF 40-45%   Anti-infectives   Start     Dose/Rate Route Frequency Ordered Stop   08/21/14 1300  levofloxacin (LEVAQUIN) IVPB 750 mg     750 mg 100 mL/hr over 90 Minutes Intravenous Every 24 hours 08/21/14 1230       Assessment: 48yo obese female with COPD EXACERBATION.  Asked to initiate Levaquin.  Renal fxn is at baseline.  Estimated Creatinine Clearance: 103.1 ml/min (by C-G formula based on Cr of 0.58).  Goal of Therapy:  Eradicate infection.  Plan:  Levaquin  IV q24hrs Switch to PO when improved / appropriate Monitor labs, progress, and cultures  Valrie Hart A 08/21/2014,12:35  PM

## 2014-08-21 NOTE — Progress Notes (Signed)
[name] PROGRESS NOTE  Kim [name] ZSM:[id number] DOB: 10-17-1966 DOA: 08/20/2014 PCP: No PCP Per Patient  Assessment/Plan: 1. COPD exacerbation: improved this am. Improved air flow. Will continue with nebs and solumedrol as well as symbicort inhaler. Provide levaquin as well.  Will wean solumedrol somewhat. ssats 92% on 2L which is her baseline. Mobilize 2. Chest pain with elevated troponin level: ACS ruled out.  troponin chronically elevated. Evaluated by cards who opine troponin trending down in patient with low risk stress test in July, non-obstructive CAD per cath 2013. Cards recommended continuing BB and increasing amlodipine. Continue lasix 3. Afib: rate controlled. Was on Eliquis but stopped herself as she could no "tolerate it". She resumed her coumadin and requested coumadin be used. INR 1.08 on admission. Will dc eliquis and request coumadin per pharmacy 4. Hx of PE. Ct 4/15 negative for PE. On anticoagulation with some concern for compliance.  5. Depression/anxiety: appears to be at baseline. Continue seroquel 6. Hyperglycemia: likely related to steroids. Will check A1c and use SSI for optimal control   Code Status: full Family Communication: none present Disposition Plan: home when ready   Consultants:  cardiology  Procedures:  none  Antibiotics:  levaquin 08/21/14>>  HPI/Subjective: reports breathing "a little better". Chest pain "almost gone" Objective: Filed Vitals:   08/21/14 1045  BP: 114/61  Pulse: 71  Temp:   Resp:     Intake/Output Summary (Last 24 hours) at 08/21/14 1214 Last data filed at 08/21/14 0900  Gross per 24 hour  Intake 1393.33 ml  Output      0 ml  Net 1393.33 ml   Filed Weights   08/20/14 1221  Weight: 113.399 kg (250 lb)    Exam:   General:  Obese appears comfortable  Cardiovascular: RRR No MGR No LE edema  Respiratory: mild increased work of breathing with conversation. BS with expiratory wheeze throughout. No  crackles. Rhonchi as well  Abdomen: obese soft +BS non-tender to palpation  Musculoskeletal: no clubbing or cyanosis   Data Reviewed: Basic Metabolic Panel:  Recent Labs Lab 08/20/14 1540 08/21/14 0620  NA 141 140  K 4.4 4.4  CL 101 101  CO2 30 30  GLUCOSE 97 190*  BUN 8 8  CREATININE 0.59 0.58  CALCIUM 8.9 8.5   Liver Function Tests:  Recent Labs Lab 08/21/14 0620  AST 43*  ALT 30  ALKPHOS 80  BILITOT 0.7  PROT 7.2  ALBUMIN 3.3*   No results found for this basename: LIPASE, AMYLASE,  in the last 168 hours No results found for this basename: AMMONIA,  in the last 168 hours CBC:  Recent Labs Lab 08/20/14 1540 08/21/14 0620  WBC 7.6 4.5  NEUTROABS 4.9  --   HGB 13.2 12.8  HCT 40.8 39.5  MCV 103.6* 104.2*  PLT 196 162   Cardiac Enzymes:  Recent Labs Lab 08/20/14 1540 08/20/14 1925 08/20/14 2311 08/21/14 0620  TROPONINI 0.57* 0.31* 0.45* 0.41*   BNP (last 3 results)  Recent Labs  07/21/14 2100 07/23/14 1509 08/20/14 1540  PROBNP 2436.0* 1639.0* 1380.0*   CBG: No results found for this basename: GLUCAP,  in the last 168 hours  Recent Results (from the past 240 hour(s))  MRSA PCR SCREENING     Status: Abnormal   Collection Time    08/21/14 12:30 AM      Result Value Ref Range Status   MRSA by PCR POSITIVE (*) NEGATIVE Final   Comment:  The GeneXpert MRSA Assay (FDA     approved for NASAL specimens     only), is one component of a     comprehensive MRSA colonization     surveillance program. It is not     intended to diagnose MRSA     infection nor to guide or     monitor treatment for     MRSA infections.     RESULT CALLED TO, READ BACK BY AND VERIFIED WITH:     [place] Sizer [id number] ON [id number] BY FORSYTH K     Studies: Dg Chest 2 View  08/20/2014   CLINICAL DATA:  Shortness of breath, chest pain.  EXAM: CHEST  2 VIEW  COMPARISON:  July 24, 2014.  FINDINGS: The heart size and mediastinal contours are within normal limits. Both  lungs are clear. Left-sided pacemaker is unchanged in position. No pneumothorax or pleural effusion is noted. The visualized skeletal structures are unremarkable.  IMPRESSION: No acute cardiopulmonary abnormality seen.   Electronically Signed   By: Roque Lias M.D.   On: 08/20/2014 16:31    Scheduled Meds: . [START ON 08/22/2014] amLODipine  5 mg Oral Daily  . [START ON 08/22/2014] antiseptic oral rinse  7 mL Mouth Rinse BID  . budesonide-formoterol  2 puff Inhalation BID  . Chlorhexidine Gluconate Cloth  6 each Topical Q0600  . furosemide  80 mg Oral BID  . insulin aspart  0-15 Units Subcutaneous TID WC  . insulin aspart  0-5 Units Subcutaneous QHS  . methylPREDNISolone (SOLU-MEDROL) injection  60 mg Intravenous 3 times per day  . metoprolol succinate  25 mg Oral Daily  . mupirocin ointment  1 application Nasal BID  . pantoprazole  40 mg Oral Daily  . QUEtiapine  50 mg Oral BID  . sodium chloride  3 mL Intravenous Q12H  . warfarin  7.5 mg Oral Once  . Warfarin - Pharmacist Dosing Inpatient   Does not apply Q24H   Continuous Infusions: . sodium chloride 50 mL/hr at 08/20/14 2040    Principal Problem:   COPD exacerbation Active Problems:   Chest pain   Atrial fibrillation   Depression with anxiety   Elevated troponin I level   Hyperglycemia    Time spent: 35 minutes    [facility] M  [name] Pager [phone]. If 7PM-7AM, please contact night-coverage at [email or web address], password [facility] 08/21/2014, 12:14 PM  LOS: 1 day

## 2014-08-21 NOTE — Progress Notes (Signed)
Patient seen and examined. Note reviewed  She has been admitted to the hospital with shortness of breath.  She has COPD, CHF and is chronically on oxygen.  She did have mildly elevated troponins which appear to be a chronic finding. She was seen by cardiology and no further work up was recommended. She is currently on steroids, abx and bronchodilators for copd exacerbation.  On my evaluation, patient will answer questions, but often falls asleep during visit. Despite repeatedly falling asleep, she continues to ask for more pain/anxiety medications. Would discontinue all narcotics/benzos, discontinue AM seroquel, check abg.  Blood sugars are elevated due to steroids.  Will change SSI to resistant. She reports not having nausea and vomiting with Eliquis.  Will change to Xarelto.  If she does not tolerate Xarelto, may need to go back to coumadin, although with her poor follow up, she would not be a good candidate for coumadin. I anticipate that she should be ready to discharge home in the next 24 hours.  MEMON,JEHANZEB

## 2014-08-21 NOTE — Progress Notes (Signed)
ANTICOAGULATION CONSULT NOTE - Initial Consult  Pharmacy Consult for Xarelto Indication: atrial fibrillation  Allergies  Allergen Reactions  . Nitroglycerin Other (See Comments)    CAUSED NUMBNESS ALL OVER  . Doxycycline Itching  . Flexeril [Cyclobenzaprine] Other (See Comments)    Sweating, Lightheaded   . Ibuprofen Other (See Comments)    Increase BP, dizziness, lightheaded  . Tramadol Itching  . Tylenol [Acetaminophen] Other (See Comments)    Pt has Hep C  . Vesicare [Solifenacin] Other (See Comments)    Makes my sweat turn yellow  . Hydralazine     Altered mental status, hallucinations, angression  . Amoxil [Amoxicillin] Nausea Only    Patient Measurements: Height: 5\' 3"  (160 cm) Weight: 250 lb (113.399 kg) IBW/kg (Calculated) : 52.4  Vital Signs: Temp: 98.4 F (36.9 C) (09/16 0508) Temp src: Oral (09/16 0508) BP: 109/58 mmHg (09/16 0508) Pulse Rate: 76 (09/16 0508)  Labs:  Recent Labs  08/20/14 1540 08/20/14 1925 08/20/14 2311 08/21/14 0620  HGB 13.2  --   --  12.8  HCT 40.8  --   --  39.5  PLT 196  --   --  162  LABPROT 14.0  --   --   --   INR 1.08  --   --   --   CREATININE 0.59  --   --  0.58  TROPONINI 0.57* 0.31* 0.45* 0.41*    Estimated Creatinine Clearance: 103.1 ml/min (by C-G formula based on Cr of 0.58).   Medical History: Past Medical History  Diagnosis Date  . COPD (chronic obstructive pulmonary disease)     Home O2 3L  . Coronary atherosclerosis of native coronary artery     Cardiac catheterization 07/2012 - LAD 40%; small D2 with 60 to 70% ostial; OM1 30 to 40%; RCA 20%; PL1 40%   . Hypothyroidism   . Hepatitis C   . Pneumonia   . Cirrhosis   . Type 2 diabetes mellitus   . GERD (gastroesophageal reflux disease)   . Headache(784.0)   . Anxiety   . Atrial fibrillation and flutter   . Chronic pain   . PE (pulmonary embolism)   . Chronic anticoagulation   . History of medication noncompliance   . SSS (sick sinus syndrome)    Biotronik pacemaker  . Cardiomyopathy     LVEF 40-45%    Medications:  Scheduled:  . amLODipine  5 mg Oral Daily  . antiseptic oral rinse  7 mL Mouth Rinse BID  . budesonide-formoterol  2 puff Inhalation BID  . Chlorhexidine Gluconate Cloth  6 each Topical Q0600  . furosemide  80 mg Oral BID  . insulin aspart  0-20 Units Subcutaneous TID WC  . insulin aspart  0-5 Units Subcutaneous QHS  . levofloxacin  500 mg Oral Daily  . metoprolol succinate  25 mg Oral Daily  . mupirocin ointment  1 application Nasal BID  . pantoprazole  40 mg Oral Daily  . rivaroxaban  20 mg Oral Q supper  . sodium chloride  3 mL Intravenous Q12H    Assessment: 47 yoF on chronic anticoagulation for Afib.  She was recently started on Eliquis which she stopped taking due to nausea/vomiting side effects.  She was then switched back to Coumadin , however MD does not feel this is best option due to poor compliance with healthcare follow-up.   Asked to dose trial of Xarelto.    Goal of Therapy:  Monitor platelets by anticoagulation protocol: Yes  Plan:  Xarelto  po daily Monitor for s/sx of bleeding Provide patient education  Elson Clan 08/22/2014,7:53 AM

## 2014-08-21 NOTE — Progress Notes (Signed)
Inpatient Diabetes Program Recommendations  AACE/ADA: New Consensus Statement on Inpatient Glycemic Control (2013)  Target Ranges:  Prepandial:   less than 140 mg/dL      Peak postprandial:   less than 180 mg/dL (1-2 hours)      Critically ill patients:  140 - 180 mg/dL   Results for CHAVIVA, DEFENBAUGH (MRN [id number]) as of 08/21/2014 09:49  Ref. Range 08/20/2014 15:40 08/21/2014 06:20  Glucose Latest Range: 70-99 mg/dL 97 280 (H)   Results for LELIANA, SEHNERT (MRN [id number]) as of 08/21/2014 09:49  Ref. Range 12/15/2013 02:40  Hemoglobin A1C Latest Range: <5.7 % 6.4 (H)   Diabetes history: DM2 Outpatient Diabetes medications: None Current orders for Inpatient glycemic control: None  Inpatient Diabetes Program Recommendations Correction (SSI): While inpatient and ordered steroids, please consider ordering CBGs with Novolog correction ACHS. HgbA1C: Please consider ordering an A1C to evaluate glycemic control over the past 2-3 months.  Thanks, Orlando Penner, RN, MSN, CCRN Diabetes Coordinator Inpatient Diabetes Program [phone] (Team Pager) [phone] (AP office) [phone] [facility] office)

## 2014-08-21 NOTE — Progress Notes (Signed)
Dr. Kerry Hough notified that patient blood sugar continues to be 298. Will give 10 units of novolog at this time as ordered by physicial.

## 2014-08-21 NOTE — Progress Notes (Signed)
ANTICOAGULATION CONSULT NOTE - Initial Consult  Pharmacy Consult for Coumadin Indication: atrial fibrillation  Allergies  Allergen Reactions  . Nitroglycerin Other (See Comments)    CAUSED NUMBNESS ALL OVER  . Doxycycline Itching  . Flexeril [Cyclobenzaprine] Other (See Comments)    Sweating, Lightheaded   . Ibuprofen Other (See Comments)    Increase BP, dizziness, lightheaded  . Tramadol Itching  . Tylenol [Acetaminophen] Other (See Comments)    Pt has Hep C  . Vesicare [Solifenacin] Other (See Comments)    Makes my sweat turn yellow  . Hydralazine     Altered mental status, hallucinations, angression  . Amoxil [Amoxicillin] Nausea Only   Patient Measurements: Height: 5\' 3"  (160 cm) Weight: 250 lb (113.399 kg) IBW/kg (Calculated) : 52.4  Vital Signs: Temp: 97.8 F (36.6 C) (09/15 0506) Temp src: Oral (09/15 0506) BP: 114/61 mmHg (09/15 1045) Pulse Rate: 71 (09/15 1045)  Labs:  Recent Labs  08/20/14 1540 08/20/14 1925 08/20/14 2311 08/21/14 0620  HGB 13.2  --   --  12.8  HCT 40.8  --   --  39.5  PLT 196  --   --  162  LABPROT 14.0  --   --   --   INR 1.08  --   --   --   CREATININE 0.59  --   --  0.58  TROPONINI 0.57* 0.31* 0.45* 0.41*   Estimated Creatinine Clearance: 103.1 ml/min (by C-G formula based on Cr of 0.58).  Medical History: Past Medical History  Diagnosis Date  . COPD (chronic obstructive pulmonary disease)     Home O2 3L  . Coronary atherosclerosis of native coronary artery     Cardiac catheterization 07/2012 - LAD 40%; small D2 with 60 to 70% ostial; OM1 30 to 40%; RCA 20%; PL1 40%   . Hypothyroidism   . Hepatitis C   . Pneumonia   . Cirrhosis   . Type 2 diabetes mellitus   . GERD (gastroesophageal reflux disease)   . Headache(784.0)   . Anxiety   . Atrial fibrillation and flutter   . Chronic pain   . PE (pulmonary embolism)   . Chronic anticoagulation   . History of medication noncompliance   . SSS (sick sinus syndrome)    Biotronik pacemaker  . Cardiomyopathy     LVEF 40-45%   Assessment: 48yo female with h/o afib.  Pt had been transitioned to Eliquis recently but is now refusing Eliquis stating that it causes N/V. Pt is now being placed back on Warfarin.  INR is at baseline.  Goal of Therapy:  INR 2-3 Monitor platelets by anticoagulation protocol: Yes   Plan:   Coumadin 7.5mg  po today x 1 dose  INR daily  Further doses based on INR results  Valrie Hart A 08/21/2014,11:09 AM

## 2014-08-21 NOTE — Consult Note (Signed)
The patient was seen and examined, and I agree with the assessment and plan as documented above, with modifications as noted below. 48 yr old woman with multiple hospitalizations for chest pain and shortness of breath admitted with COPD exacerbation. Has mildly elevated troponins which peaked at 0.57, now trending down to 0.41. ECG shows ventricular pacing. Echo from 05/17/14 demonstrated normal LV systolic function, EF 55-60%, with reported grade 2 diastolic dysfunction in body of report and grade I in summary. Has atrial fibrillation and takes warfarin (refuses Eliquis), pacemaker, medication non-compliance, chronic diffuse pain, and COPD. Cath in 2013 showed mild non-obstructive disease (LM normal, LAD 40% mid, D2 60-70% noted to be small vessels, LCX OM1 30-40%, RCA PL1 40%). CXR with no acute process.  Nuclear MPI study from 06/14/14 deemed low risk and showed a mildly reversible defect in the apical anterior and mid anterior walls, which may represent a mild degree of ischemia vs variable  soft tissue attenuation artifact. At the present time, pt says chest pain is not as bothersome and has improved. Very groggy but also says SOB has improved. Physical exam notable for expiratory wheezes and prolonged expiratory phase. No pedal edema to suggest volume overload (BNP 1380, 1639 on 8/17). Continue metoprolol. Will increase amlodipine to 5 mg for antianginal and to amerliorate potential vasospasm. BP mildly elevated as well. Would recommend continuing to treat for COPD exacerbation. Given noncompliance, consider Xarelto for anticoagulation if pt is agreeable. No further recommendations. No plans for cardiac testing (invasive or noninvasive). Will sign off.

## 2014-08-21 NOTE — Consult Note (Signed)
CARDIOLOGY CONSULT NOTE   Patient ID: Kim [name] MRN: [id number] DOB/AGE: [birth year] 48 y.o.  Admit Date: 08/20/2014 Referring Physician: [facility] Primary Physician: No PCP Per Patient Consulting Cardiologist: Prentice Docker MD Primary Cardiologist [name] MD/Taylor, Sharlot Gowda.  Reason for Consultation: Chest Pain  Clinical Summary Kim [name] is a 48 y.o.female with frequent admissions and ER evaluations for non-cardiac chest pain, and shortness of breath; with known history of atrial fibrillation, PE on chronic anticoagulation, SSS, s/p Pacemaker, non-obstructive CAD with recent cath in 2013, low risk stress myoview in July of 2015, chronically elevated troponins, COPD, anxiety and depression, medical non-compliance and drug seeking behavior.   She was admitted with recurrent dyspnea, chest pain, and COPD exacerbation. She is sedated on morphine and speaking incoherently, falling asleep during interview. Hx is obtained from current and PMH.   ER records and H&P state that she has been having worsening shortness of breath for 2 days. Some coughing.  BP in ER 134/68, HR 80, O2 sat 97%. Troponin 0.57.  Pro-BNP 1380.0. CXR no acute cardiopulmonary abnormality.EKG:  Paced rhythm.  She is being treated with neb tx and morphine for pain control. According to ER notes, she took herself off of Eliquis due to nausea and is now taking coumadin 4 mg daily. INR 1.14. However, home medications have her on Eliquis 2.5 mg BID.  Allergies  Allergen Reactions  . Nitroglycerin Other (See Comments)    CAUSED NUMBNESS ALL OVER  . Doxycycline Itching  . Flexeril [Cyclobenzaprine] Other (See Comments)    Sweating, Lightheaded   . Ibuprofen Other (See Comments)    Increase BP, dizziness, lightheaded  . Tramadol Itching  . Tylenol [Acetaminophen] Other (See Comments)    Pt has Hep C  . Vesicare [Solifenacin] Other (See Comments)    Makes my sweat turn yellow  . Hydralazine     Altered mental  status, hallucinations, angression  . Amoxil [Amoxicillin] Nausea Only    Medications Scheduled Medications: . amLODipine  2.5 mg Oral Daily  . [START ON 08/22/2014] antiseptic oral rinse  7 mL Mouth Rinse BID  . apixaban  5 mg Oral BID  . budesonide-formoterol  2 puff Inhalation BID  . Chlorhexidine Gluconate Cloth  6 each Topical Q0600  . furosemide  80 mg Oral BID  . Influenza vac split quadrivalent PF  0.5 mL Intramuscular Tomorrow-1000  . methylPREDNISolone (SOLU-MEDROL) injection  80 mg Intravenous Q6H  . metoprolol succinate  25 mg Oral Daily  . mupirocin ointment  1 application Nasal BID  . pantoprazole  40 mg Oral Daily  . QUEtiapine  50 mg Oral BID  . sodium chloride  3 mL Intravenous Q12H    Infusions: . sodium chloride 50 mL/hr at 08/20/14 2040    PRN Medications: morphine injection, ondansetron (ZOFRAN) IV, ondansetron   Past Medical History  Diagnosis Date  . COPD (chronic obstructive pulmonary disease)     Home O2 3L  . Coronary atherosclerosis of native coronary artery     Cardiac catheterization 07/2012 - LAD 40%; small D2 with 60 to 70% ostial; OM1 30 to 40%; RCA 20%; PL1 40%   . Hypothyroidism   . Hepatitis C   . Pneumonia   . Cirrhosis   . Type 2 diabetes mellitus   . GERD (gastroesophageal reflux disease)   . Headache(784.0)   . Anxiety   . Atrial fibrillation and flutter   . Chronic pain   . PE (pulmonary embolism)   . Chronic anticoagulation   .  History of medication noncompliance   . SSS (sick sinus syndrome)     Biotronik pacemaker  . Cardiomyopathy     LVEF 40-45%    Past Surgical History  Procedure Laterality Date  . Cholecystectomy    . Pacemaker insertion  02/2012    Biotronik PPM   . Cardiac electrophysiology study and ablation    . Esophagogastroduodenoscopy  Jan 2010    Dr. Arlyce Dice: multiple gastric erosions, benign path  . Esophagogastroduodenoscopy N/A 01/10/2014    [id number] EG junction. Query short segment Barrett's  esophagus-status post pinch biopsy as described above. Hiatal hernia; otherwise negative EGD    Family History  Problem Relation Age of Onset  . Coronary artery disease Father 2  . Coronary artery disease Mother 69  . Coronary artery disease Brother   . Colon cancer Neg Hx     Social History Kim [name] reports that she has been smoking Cigarettes.  She has a 15 pack-year smoking history. Her smokeless tobacco use includes Snuff. Kim [name] reports that she does not drink alcohol.  Review of Systems Otherwise reviewed and negative except as outlined.  Physical Examination Blood pressure 129/69, pulse 72, temperature 97.8 F (36.6 C), temperature source Oral, resp. rate 18, height  (1.6 m), weight 250 lb (113.399 kg), last menstrual period 02/08/2007, SpO2 93.00%.  Intake/Output Summary (Last 24 hours) at 08/21/14 0746 Last data filed at 08/21/14 0600  Gross per 24 hour  Intake 1153.33 ml  Output      0 ml  Net 1153.33 ml    Telemetry:Paced rhythm  GEN: Sedated, sleepy HEENT: Conjunctiva and lids normal, oropharynx clear with moist mucosa. Neck: Supple, no elevated JVP or carotid bruits, no thyromegaly. Lungs: Inspiratory and expiratory wheezes. No coughing. Poor inspiratory effort.  Cardiac: Regular rate and rhythm, no S3 or significant systolic murmur, no pericardial rub. Abdomen: Soft, nontender, no hepatomegaly, bowel sounds present, no guarding or rebound. Extremities: No pitting edema, distal pulses 2+. Skin: Warm and dry. Musculoskeletal: No kyphosis. Neuropsychiatric: Sedated, falls asleep often during exam and questioning.  Prior Cardiac Testing/Procedures 1. Cardiac Cath 07/22/2012  Left main: Normal  LAD: Large vessel. There is a curly-Q in the mid-vessel which has associated calcification and about 40% stenosis. Mild plaque distally. 2 small diagonals In ostium of small D2 about 60-70% ostial stenosis.  LCX: Made up primarily of large OM-1 with 30-40% mid  stenosis  RCA: Very large dominant vessel. 20% mid stenosis. PL1 with 40% proximal stenosis  LV-gram done in the RAO projection: Ejection fraction = Not well opacified, EF ~40% with apical dyskinesis  Assessment:  1. Mild non-obstructive CAD  2. Apparent mild-moderate LV dysfunction with apical dyskinesis   2. Echocardiogram 05/17/2014  Left ventricle: The cavity size was normal. There was mild focal basal hypertrophy of the septum. Systolic function was normal. The estimated ejection fraction was in the range of 55% to 60%. Features are consistent with a pseudonormal left ventricular filling pattern, with concomitant abnormal relaxation and increased filling pressure (grade 2 diastolic dysfunction). Doppler parameters are consistent with elevated mean left atrial filling pressure. - Ventricular septum: Septal motion showed abnormal function and dyssynergy. - Mitral valve: Calcified annulus. There was mild regurgitation. - Left atrium: The atrium was moderately dilated. - Right ventricle: Pacer wire or catheter noted in right ventricle. Systolic function was mildly reduced. - Right atrium: Central venous pressure (est): 8 mm Hg. - Tricuspid valve: There was mild regurgitation. - Pulmonary arteries: Systolic pressure could not be  accurately estimated. - Pericardium, extracardiac: A prominent pericardial fat pad was present. A trivial pericardial effusion was identified.  3. Pacemaker Interrogation 05/18/2014 [employer]  Pacemaker check in patient @ [employer] by industry. Normal device function. Thresholds, sensing, impedances consistent with previous measurements. Device programmed to maximize longevity. 11 high ventricular rates noted. Device programmed at appropriate safety margins. Histogram distribution appropriate for patient activity level. Device programmed to optimize intrinsic conduction. Estimated longevity 66 months. Patient education completed.  4.Stres Test 06/14/2014 1. Low  risk Lexiscan Cardiolite stress test.  2. Mildly reversible defect in the apical anterior and mid anterior  walls, which may represent a mild degree of ischemia vs variable  soft tissue attenuation artifact.  3. Normal left ventricular systolic function, calculated LVEF 52%.  4. No chest pain was reported.  Lab Results  Basic Metabolic Panel:  Recent Labs Lab 08/20/14 1540 08/21/14 0620  NA 141 140  K 4.4 4.4  CL 101 101  CO2 30 30  GLUCOSE 97 190*  BUN 8 8  CREATININE 0.59 0.58  CALCIUM 8.9 8.5    Liver Function Tests:  Recent Labs Lab 08/21/14 0620  AST 43*  ALT 30  ALKPHOS 80  BILITOT 0.7  PROT 7.2  ALBUMIN 3.3*    CBC:  Recent Labs Lab 08/20/14 1540 08/21/14 0620  WBC 7.6 4.5  NEUTROABS 4.9  --   HGB 13.2 12.8  HCT 40.8 39.5  MCV 103.6* 104.2*  PLT 196 162    Cardiac Enzymes:  Recent Labs Lab 08/20/14 1540 08/20/14 1925 08/20/14 2311 08/21/14 0620  TROPONINI 0.57* 0.31* 0.45* 0.41*    BNP: 1,380   Radiology: Dg Chest 2 View  08/20/2014   CLINICAL DATA:  Shortness of breath, chest pain.  EXAM: CHEST  2 VIEW  COMPARISON:  July 24, 2014.  FINDINGS: The heart size and mediastinal contours are within normal limits. Both lungs are clear. Left-sided pacemaker is unchanged in position. No pneumothorax or pleural effusion is noted. The visualized skeletal structures are unremarkable.  IMPRESSION: No acute cardiopulmonary abnormality seen.   Electronically Signed   By: Roque Lias M.D.   On: 08/20/2014 16:31     ECG: Paced    Impression and Recommendations  1. Non-Cardiac Chest Pain: She has chronically elevated troponins, with low risk stress test in July of 2015, non-obstructive CAD per cath in 2013. She has had frequent admissions to ER and to hospital for recurrent chest pain and dyspnea and has been ruled out for ACS. Would not cycle serial enzymes as they would not provide new information. No inpt cardiac testing is planned. Continue BB,  ASA. D/C morphine.   2.Atrial fib: Currently rate controlled. Confusion as to which anticoagulation medication she is taking. ER note states that she stopped Eliquis and is now on coumadin. INR is sub-therapeutic. Medical non-compliance is an issue concerning coumadin as INR checks will be necessary. Recommend restarting Eliquis BID or Xarelto daily dosing to assist with compliance. She is too sedated to get accurate information. She is not anemic on review of labs.  3. Hx of PE: Last CT in April was negative for PE. Repeat if clinical suspicion for recurrence with uncertainty of anticoagulation compliance.   4. COPD: Treatment per PTH with steroids and breathing treatments.   5. S/P Bi[employer]ronic Pacemaker: Last interrogated in July of 2015.   Signed: Bettey Mare. Teasia Zapf NP  08/21/2014, 7:46 AM Co-Sign MD

## 2014-08-22 DIAGNOSIS — J9620 Acute and chronic respiratory failure, unspecified whether with hypoxia or hypercapnia: Secondary | ICD-10-CM | POA: Diagnosis present

## 2014-08-22 DIAGNOSIS — I5033 Acute on chronic diastolic (congestive) heart failure: Secondary | ICD-10-CM

## 2014-08-22 LAB — GLUCOSE, CAPILLARY
GLUCOSE-CAPILLARY: 175 mg/dL — AB (ref 70–99)
GLUCOSE-CAPILLARY: 399 mg/dL — AB (ref 70–99)
Glucose-Capillary: 325 mg/dL — ABNORMAL HIGH (ref 70–99)
Glucose-Capillary: 260 mg/dL — ABNORMAL HIGH (ref 70–99)

## 2014-08-22 MED ORDER — QUETIAPINE FUMARATE 25 MG PO TABS
50.0000 mg | ORAL_TABLET | Freq: Every day | ORAL | Status: DC
  Administered 2014-08-22: 50 mg via ORAL
  Filled 2014-08-22: qty 2

## 2014-08-22 MED ORDER — PREDNISONE 10 MG PO TABS
50.0000 mg | ORAL_TABLET | Freq: Every day | ORAL | Status: DC
  Administered 2014-08-22: 50 mg via ORAL
  Filled 2014-08-22: qty 2
  Filled 2014-08-22: qty 1

## 2014-08-22 MED ORDER — ALBUTEROL SULFATE (2.5 MG/3ML) 0.083% IN NEBU
INHALATION_SOLUTION | RESPIRATORY_TRACT | Status: AC
  Administered 2014-08-22: 2.5 mg
  Filled 2014-08-22: qty 3

## 2014-08-22 MED ORDER — IPRATROPIUM-ALBUTEROL 0.5-2.5 (3) MG/3ML IN SOLN
3.0000 mL | RESPIRATORY_TRACT | Status: DC
  Administered 2014-08-22 – 2014-08-24 (×9): 3 mL via RESPIRATORY_TRACT
  Filled 2014-08-22 (×9): qty 3

## 2014-08-22 MED ORDER — IPRATROPIUM BROMIDE 0.02 % IN SOLN
RESPIRATORY_TRACT | Status: AC
  Administered 2014-08-22: 0.5 mg
  Filled 2014-08-22: qty 2.5

## 2014-08-22 MED ORDER — ALBUTEROL SULFATE (2.5 MG/3ML) 0.083% IN NEBU: 2.5000 mg | INHALATION_SOLUTION | RESPIRATORY_TRACT | Status: DC

## 2014-08-22 MED ORDER — PREDNISONE 20 MG PO TABS
50.0000 mg | ORAL_TABLET | Freq: Two times a day (BID) | ORAL | Status: DC
  Administered 2014-08-22 – 2014-08-24 (×4): 50 mg via ORAL
  Filled 2014-08-22 (×3): qty 1
  Filled 2014-08-22: qty 2
  Filled 2014-08-22: qty 1
  Filled 2014-08-22 (×3): qty 2

## 2014-08-22 NOTE — Care Management Note (Addendum)
    Page 1 of 2   08/24/2014     12:27:16 PM CARE MANAGEMENT NOTE 08/24/2014  Patient:  Kim [name], Kim [name]   Account Number:  [id number]  Date Initiated:  08/22/2014  Documentation initiated by:  Sharrie Rothman  Subjective/Objective Assessment:   Pt admitted from home with COPD exacerbation. Pt lives with her son and will return home at discharge. Pt has home O2 in place from [facility]. Pt requires min assistance with ADL's.     Action/Plan:   Followup appt made with [facility] and documented on AVS. CM called [facility] and made referral. Pt will also be given number to [facility] for transportation. CM explained importance of compliance with medical treatment. Pt taught by teachbac.   Anticipated DC Date:  08/23/2014   Anticipated DC Plan:  HOME/SELF CARE      DC Planning Services  CM consult      PAC Choice  DURABLE MEDICAL EQUIPMENT  HOME HEALTH   Choice offered to / List presented to:  C-1 Patient   DME arranged  NEBULIZER MACHINE      DME agency  [facility]     [facility] arranged  HH-1 RN      [facility] agency  [facility]   Status of service:  Completed, signed off Medicare Important Message given?   (If response is "NO", the following Medicare IM given date fields will be blank) Date Medicare IM given:   Medicare IM given by:   Date Additional Medicare IM given:   Additional Medicare IM given by:    Discharge Disposition:  HOME W HOME HEALTH SERVICES  Per UR Regulation:    If discussed at Long Length of Stay Meetings, dates discussed:    Comments:  08/24/14 1145 Arlyss Queen, RN BSN CM Pt discharged home today. HH RN arranged with [facility] and orders sent to [facility]. HH services to start on Monday 08/27/14. HH to draw PT/INR on Monday. FOllowup appt made with [facility] and documented on AVS and pt is aware. Pt given phone numbers of [facility] and [facility] for assistance with MD appt and getting medications. Pt also given number to Blima Singer  at [facility] for medication assistance with copays. Pt neb machine ordered from [facility] and will be delivered to pts home after discharge. Pt also arranged community follow up with [facility]. CM stressed importance of compliance with medications and followup appts. Pt verbalized understanding. Pt and pts nurse aware of discharge arrangements.  08/22/14 1440 Arlyss Queen, RN BSN CM

## 2014-08-22 NOTE — Progress Notes (Signed)
[name] PROGRESS NOTE  Kim [name] HYQ:[id number] DOB: 11-Jun-1966 DOA: 08/20/2014 PCP: No PCP Per Patient  Assessment/Plan: 1. COPD exacerbation: continues to improve. Appears close to baseline.  Will continue with nebs and transition solumedrol to prednisone. Continue with symbicort inhaler and levaquin day #3. Oxygen saturation level 97% on 2L which is her baseline. Mobilize 2. Chest pain with elevated troponin level: continues to complain intermittent chest pain.  ACS ruled out. Troponin chronically elevated. Evaluated by cards who opine troponin trending down in patient with low risk stress test in July, non-obstructive CAD per cath 2013. Cards recommended continuing BB and increasing amlodipine. Continue lasix at home dose as well. 3. Afib: rate controlled. Was on Eliquis but stopped herself as she could no "tolerate it" due to hypertension. She resumed her coumadin. History of non-compliance so coumadin not best choice. Will provide xarelto and voucher for same. 4. Hx of PE. Ct 4/15 negative for PE. On anticoagulation with some concern for compliance. See #3.  5. Depression/anxiety: appears to be at baseline. Continue seroquel 6. Hyperglycemia: likely related to steroids. A1c 5.8. CBG range 249 -325. Continue  SSI for optimal control 7. Acute on chronic respiratory failure: related COPD exacerbation, acute on chronic systolic CHF. Evaluated by cardiology. See #2. Continue home lasix. Volume status -1.6L. Monitor daily weights and intake and output 8. Acute on chronic systolic CHF. See above. Cardiology evaluated. Echo from 6/15 yields normal LV function with EF 55% and grade 2 diastolic dysfunction   Code Status: full Family Communication: none presenet  Disposition Plan: hopefully tomorrow. [facility] follow [facility], information about arranging transportation.    Consultants:  cardiology  Procedures:  none  Antibiotics:  levaquin  08/20/14>>  HPI/Subjective: Sitting up in bed eating breakfast. Reports not sleeping at all last night. States "i never sleep at night". Denies discomfort  Objective: Filed Vitals:   08/22/14 0508  BP: 109/58  Pulse: 76  Temp: 98.4 F (36.9 C)  Resp: 20    Intake/Output Summary (Last 24 hours) at 08/22/14 1357 Last data filed at 08/22/14 0815  Gross per 24 hour  Intake    600 ml  Output   3000 ml  Net  -2400 ml   Filed Weights   08/20/14 1221  Weight: 113.399 kg (250 lb)    Exam:   General:  Obese appears comfortable  Cardiovascular:  RRR no m/g/r/ no LE edema PPP  Respiratory: normal effort with conversation. Good air flow with diffuse rhonchi and faint expiratory wheeze no crackles  Abdomen: obese soft +BS non-distended no guarding    Musculoskeletal: no clubbing now cyanosis   Data Reviewed: Basic Metabolic Panel:  Recent Labs Lab 08/20/14 1540 08/21/14 0620  NA 141 140  K 4.4 4.4  CL 101 101  CO2 30 30  GLUCOSE 97 190*  BUN 8 8  CREATININE 0.59 0.58  CALCIUM 8.9 8.5   Liver Function Tests:  Recent Labs Lab 08/21/14 0620  AST 43*  ALT 30  ALKPHOS 80  BILITOT 0.7  PROT 7.2  ALBUMIN 3.3*   No results found for this basename: LIPASE, AMYLASE,  in the last 168 hours No results found for this basename: AMMONIA,  in the last 168 hours CBC:  Recent Labs Lab 08/20/14 1540 08/21/14 0620  WBC 7.6 4.5  NEUTROABS 4.9  --   HGB 13.2 12.8  HCT 40.8 39.5  MCV 103.6* 104.2*  PLT 196 162   Cardiac Enzymes:  Recent Labs Lab  08/20/14 1540 08/20/14 1925 08/20/14 2311 08/21/14 0620  TROPONINI 0.57* 0.31* 0.45* 0.41*   BNP (last 3 results)  Recent Labs  07/21/14 2100 07/23/14 1509 08/20/14 1540  PROBNP 2436.0* 1639.0* 1380.0*   CBG:  Recent Labs Lab 08/21/14 1833 08/21/14 1958 08/21/14 2310 08/22/14 0730 08/22/14 1129  GLUCAP 298* 325* 249* 325* 260*    Recent Results (from the past 240 hour(s))  MRSA PCR SCREENING      Status: Abnormal   Collection Time    08/21/14 12:30 AM      Result Value Ref Range Status   MRSA by PCR POSITIVE (*) NEGATIVE Final   Comment:            The GeneXpert MRSA Assay (FDA     approved for NASAL specimens     only), is one component of a     comprehensive MRSA colonization     surveillance program. It is not     intended to diagnose MRSA     infection nor to guide or     monitor treatment for     MRSA infections.     RESULT CALLED TO, READ BACK BY AND VERIFIED WITH:     Iran Sizer [id number] ON [id number] BY FORSYTH K     Studies: Dg Chest 2 View  08/20/2014   CLINICAL DATA:  Shortness of breath, chest pain.  EXAM: CHEST  2 VIEW  COMPARISON:  July 24, 2014.  FINDINGS: The heart size and mediastinal contours are within normal limits. Both lungs are clear. Left-sided pacemaker is unchanged in position. No pneumothorax or pleural effusion is noted. The visualized skeletal structures are unremarkable.  IMPRESSION: No acute cardiopulmonary abnormality seen.   Electronically Signed   By: Roque Lias M.D.   On: 08/20/2014 16:31    Scheduled Meds: . amLODipine  5 mg Oral Daily  . antiseptic oral rinse  7 mL Mouth Rinse BID  . budesonide-formoterol  2 puff Inhalation BID  . Chlorhexidine Gluconate Cloth  6 each Topical Q0600  . furosemide  80 mg Oral BID  . insulin aspart  0-20 Units Subcutaneous TID WC  . insulin aspart  0-5 Units Subcutaneous QHS  . levofloxacin  500 mg Oral Daily  . metoprolol succinate  25 mg Oral Daily  . mupirocin ointment  1 application Nasal BID  . pantoprazole  40 mg Oral Daily  . rivaroxaban  20 mg Oral Q supper  . sodium chloride  3 mL Intravenous Q12H   Continuous Infusions:   Principal Problem:   COPD exacerbation Active Problems:   Chest pain   Atrial fibrillation   Hepatic cirrhosis due to chronic hepatitis C infection   Depression with anxiety   Elevated troponin I level   Chronic anticoagulation   Chronic respiratory failure    Hyperglycemia   Acute-on-chronic respiratory failure    Time spent: 35 mintues    [facility] M  [name] Pager [phone]. If 7PM-7AM, please contact night-coverage at [email or web address], password [facility] 08/22/2014, 1:57 PM  LOS: 2 days

## 2014-08-22 NOTE — Progress Notes (Signed)
The patient was seen and examined. She was discussed with nurse practitioner, Ms. Vedia Coffer. Agree with her assessment and plan, but with additions below. On exam, the patient has diffuse bronchospasms bilaterally. She complains of atypical chest pain and is asking for something for pain, but she reports that she was told that she could not take Tylenol. She has an intolerance to tramadol and ibuprofen. Opiates were discontinued because of her sedation and history of drug-seeking behavior. She has been hospitalized every month for the past few months because of chest pain or COPD exacerbation or congestive heart failure exacerbation and it appears that compliance is an issue. She reports many intolerances to medications including Eliquis and Xarelto and specifically to any pain medication that is  not and opiate. Treating her is problematic. Given her recent ejection fraction of 55%, it appears that she no longer has systolic dysfunction, but rather grade 2 diastolic dysfunction.   -Will restart scheduled nebulizer treatments with DuoNeb every 4 hours and albuterol every 2 hours as needed. -Will increase prednisone from 50 mg daily to 50 mg twice a day. -Will restart Seroquel each bedtime, but at a lower dose. -Avoid opiates if at all possible.

## 2014-08-22 NOTE — Plan of Care (Signed)
Problem: COPD GOLD Progrssion Goal: ABLE TO WEAN TO ROOM AIR Outcome: Not Applicable Date Met:  58/85/02 Pt on chronic O2

## 2014-08-22 NOTE — Progress Notes (Signed)
UR chart review completed.  

## 2014-08-22 NOTE — Clinical Documentation Improvement (Signed)
Possible Clinical Conditions?  Also please specify type & acuity of CHF: Acute Respiratory Failure Acute on Chronic Respiratory Failure Chronic Respiratory Failure Acute Respiratory Insufficiency Acute Respiratory Insufficiency following surgery or trauma Other Condition Cannot Clinically Determine   Supporting Information: (As per notes) "She has COPD, CHF and is chronically on oxygen.  Thank You, Nevin Bloodgood, RN, BSN, CCDS,Clinical Documentation Specialist:  [phone]  [phone]=[employer]- Health Information Management

## 2014-08-22 NOTE — Progress Notes (Signed)
Patient refused xarelto this evening - states again it "makes me sick, only coumadin works for me."

## 2014-08-23 DIAGNOSIS — Z9119 Patient's noncompliance with other medical treatment and regimen: Secondary | ICD-10-CM

## 2014-08-23 DIAGNOSIS — I5032 Chronic diastolic (congestive) heart failure: Secondary | ICD-10-CM

## 2014-08-23 DIAGNOSIS — Z91199 Patient's noncompliance with other medical treatment and regimen due to unspecified reason: Secondary | ICD-10-CM

## 2014-08-23 LAB — CBC
HCT: 40.3 % (ref 36.0–46.0)
Hemoglobin: 13.4 g/dL (ref 12.0–15.0)
MCH: 33.9 pg (ref 26.0–34.0)
MCHC: 33.3 g/dL (ref 30.0–36.0)
MCV: 102 fL — AB (ref 78.0–100.0)
Platelets: 222 10*3/uL (ref 150–400)
RBC: 3.95 MIL/uL (ref 3.87–5.11)
RDW: 14.5 % (ref 11.5–15.5)
WBC: 14.3 10*3/uL — AB (ref 4.0–10.5)

## 2014-08-23 LAB — BASIC METABOLIC PANEL
ANION GAP: 10 (ref 5–15)
BUN: 22 mg/dL (ref 6–23)
CHLORIDE: 92 meq/L — AB (ref 96–112)
CO2: 39 meq/L — AB (ref 19–32)
Calcium: 8.3 mg/dL — ABNORMAL LOW (ref 8.4–10.5)
Creatinine, Ser: 0.81 mg/dL (ref 0.50–1.10)
GFR calc Af Amer: >90 mL/min (ref >90)
GFR calc non Af Amer: 84 mL/min — ABNORMAL LOW (ref >90)
Glucose, Bld: 294 mg/dL — ABNORMAL HIGH (ref 70–99)
Potassium: 3.7 mEq/L (ref 3.7–5.3)
SODIUM: 141 meq/L (ref 137–147)

## 2014-08-23 LAB — GLUCOSE, CAPILLARY
Glucose-Capillary: 240 mg/dL — ABNORMAL HIGH (ref 70–99)
Glucose-Capillary: 370 mg/dL — ABNORMAL HIGH (ref 70–99)
Glucose-Capillary: 295 mg/dL — ABNORMAL HIGH (ref 70–99)
Glucose-Capillary: 335 mg/dL — ABNORMAL HIGH (ref 70–99)

## 2014-08-23 LAB — VITAMIN B12: VITAMIN B 12: 448 pg/mL (ref 211–911)

## 2014-08-23 MED ORDER — INSULIN DETEMIR 100 UNIT/ML ~~LOC~~ SOLN
15.0000 [IU] | Freq: Two times a day (BID) | SUBCUTANEOUS | Status: DC
  Filled 2014-08-23 (×2): qty 0.15

## 2014-08-23 MED ORDER — HYDROCODONE-ACETAMINOPHEN 5-325 MG PO TABS
1.0000 | ORAL_TABLET | Freq: Once | ORAL | Status: AC
  Administered 2014-08-23: 1 via ORAL
  Filled 2014-08-23: qty 1

## 2014-08-23 MED ORDER — INSULIN ASPART 100 UNIT/ML ~~LOC~~ SOLN
3.0000 [IU] | Freq: Three times a day (TID) | SUBCUTANEOUS | Status: DC
  Administered 2014-08-23 – 2014-08-24 (×4): 3 [IU] via SUBCUTANEOUS

## 2014-08-23 MED ORDER — INSULIN DETEMIR 100 UNIT/ML ~~LOC~~ SOLN
15.0000 [IU] | Freq: Two times a day (BID) | SUBCUTANEOUS | Status: DC
  Administered 2014-08-23 – 2014-08-24 (×3): 15 [IU] via SUBCUTANEOUS
  Filled 2014-08-23 (×9): qty 0.15

## 2014-08-23 MED ORDER — GI COCKTAIL ~~LOC~~
30.0000 mL | Freq: Once | ORAL | Status: DC
  Filled 2014-08-23: qty 30

## 2014-08-23 MED ORDER — SODIUM CHLORIDE 0.9 % IV SOLN
INTRAVENOUS | Status: DC
  Administered 2014-08-23: 17:00:00 via INTRAVENOUS

## 2014-08-23 MED ORDER — WARFARIN - PHARMACIST DOSING INPATIENT: Status: DC

## 2014-08-23 MED ORDER — WARFARIN SODIUM 5 MG PO TABS
7.5000 mg | ORAL_TABLET | Freq: Once | ORAL | Status: AC
  Administered 2014-08-23: 7.5 mg via ORAL
  Filled 2014-08-23: qty 2

## 2014-08-23 MED ORDER — FUROSEMIDE 80 MG PO TABS
80.0000 mg | ORAL_TABLET | Freq: Every day | ORAL | Status: DC
  Administered 2014-08-24: 80 mg via ORAL
  Filled 2014-08-23: qty 1

## 2014-08-23 NOTE — Progress Notes (Signed)
ANTICOAGULATION CONSULT NOTE  Pharmacy Consult for Coumadin Indication: atrial fibrillation  Allergies  Allergen Reactions  . Nitroglycerin Other (See Comments)    CAUSED NUMBNESS ALL OVER  . Doxycycline Itching  . Flexeril [Cyclobenzaprine] Other (See Comments)    Sweating, Lightheaded   . Ibuprofen Other (See Comments)    Increase BP, dizziness, lightheaded  . Tramadol Itching  . Tylenol [Acetaminophen] Other (See Comments)    Pt has Hep C  . Vesicare [Solifenacin] Other (See Comments)    Makes my sweat turn yellow  . Hydralazine     Altered mental status, hallucinations, angression  . Amoxil [Amoxicillin] Nausea Only   Patient Measurements: Height: 5\' 3"  (160 cm) Weight: 232 lb 9.6 oz (105.507 kg) IBW/kg (Calculated) : 52.4  Vital Signs: Temp: 98 F (36.7 C) (09/17 0633) Temp src: Oral (09/17 0947) BP: 117/80 mmHg (09/17 0800) Pulse Rate: 81 (09/17 0800)  Labs:  Recent Labs  08/20/14 1540 08/20/14 1925 08/20/14 2311 08/21/14 0620 08/23/14 0619  HGB 13.2  --   --  12.8 13.4  HCT 40.8  --   --  39.5 40.3  PLT 196  --   --  162 222  LABPROT 14.0  --   --   --   --   INR 1.08  --   --   --   --   CREATININE 0.59  --   --  0.58 0.81  TROPONINI 0.57* 0.31* 0.45* 0.41*  --    Estimated Creatinine Clearance: 97.6 ml/min (by C-G formula based on Cr of 0.81).  Medical History: Past Medical History  Diagnosis Date  . COPD (chronic obstructive pulmonary disease)     Home O2 3L  . Coronary atherosclerosis of native coronary artery     Cardiac catheterization 07/2012 - LAD 40%; small D2 with 60 to 70% ostial; OM1 30 to 40%; RCA 20%; PL1 40%   . Hypothyroidism   . Hepatitis C   . Pneumonia   . Cirrhosis   . Type 2 diabetes mellitus   . GERD (gastroesophageal reflux disease)   . Headache(784.0)   . Anxiety   . Atrial fibrillation and flutter   . Chronic pain   . PE (pulmonary embolism)   . Chronic anticoagulation   . History of medication noncompliance   .  SSS (sick sinus syndrome)     Biotronik pacemaker  . Cardiomyopathy     LVEF 40-45%   Assessment: 48yo female with h/o afib.  Pt had been transitioned to Eliquis recently but is now refusing Eliquis stating that it causes N/V.  Also refused attempt to transition to Xarelto. Pt is now being placed back on Warfarin.  INR 1.08 of 9/15.  Goal of Therapy:  INR 2-3   Plan:   Repeat Coumadin 7.5mg  po today x 1 dose  INR daily  Further doses based on INR results  Mady Gemma 08/23/2014,10:13 AM

## 2014-08-23 NOTE — Progress Notes (Signed)
Inpatient Diabetes Program Recommendations  AACE/ADA: New Consensus Statement on Inpatient Glycemic Control (2013)  Target Ranges:  Prepandial:   less than 140 mg/dL      Peak postprandial:   less than 180 mg/dL (1-2 hours)      Critically ill patients:  140 - 180 mg/dL   Results for ELMER, ALESSI (MRN [id number]) as of 08/23/2014 11:58  Ref. Range 08/22/2014 07:30 08/22/2014 11:29 08/22/2014 17:28 08/22/2014 21:03 08/23/2014 07:26  Glucose-Capillary Latest Range: 70-99 mg/dL 409 (H) 811 (H) 914 (H) 399 (H) 240 (H)   Diabetes history: DM2 Outpatient Diabetes medications: None Current orders for Inpatient glycemic control: Novolog 0-20 units AC, Novolog 0-5 units HS, Novolog 3 units TID with meals for meal coverage  Inpatient Diabetes Program Recommendations Insulin - Basal: If steroids will be continued, please consider ordering low dose basal insulin. Recommend starting with Levemir 10 units daily starting now.  Insulin - Meal Coverage: Noted Novolog 3 units TID with meals was ordered today for meal coverage.  Thanks, Orlando Penner, RN, MSN, CCRN Diabetes Coordinator Inpatient Diabetes Program [phone] (Team Pager) [phone] ([place] office) [phone] [facility] office)

## 2014-08-23 NOTE — Progress Notes (Signed)
[name] PROGRESS NOTE  KRISZTINA [employer]LEMAN [employer]:[id number] DOB: 1966-08-28 DOA: 08/20/2014 PCP: No PCP Per Patient  Assessment/Plan: 1. COPD exacerbation: Much improved with scheduled nebulizer treatments and prednisone BID.  Will continue with scheduled nebs and prednisone. Continue with symbicort inhaler and levaquin day #4. Oxygen saturation level 92% on room air and 99% on 2L. Of note, serum chloride trending down and serum CO2 trending up this am. Will monitor closely.  She may benefit from home nebulizer treatments. Will request neb machine for home use.  2. Chest pain with elevated troponin level: less chest pain. ACS ruled out. Troponin chronically elevated. Evaluated by cards who opine troponin trending down in patient with low risk stress test in July, non-obstructive CAD per cath 2013. Cards recommended continuing BB and increasing amlodipine. Continue lasix at home dose as well. 3. Afib: rate controlled. Refusing xarelto and requesting coumadin. Discussed the importance of compliance with coumadin as well as frequent checks. She verbalized understanding. Marland Kitchen History of non-compliance so coumadin not first choice but patient refusing other xarelto and Eliquis. 4. Hx of PE. Ct 4/15 negative for PE. On anticoagulation with some concern for compliance. See #3.  5. Depression/anxiety: remains at baseline. Continue seroquel at lower dose 6. Hyperglycemia: likely related to steroids. A1c 5.8. CBG range 240-399. Continue SSI for optimal control and will add meal coverage as appetite very good. monitor 7. Acute on chronic respiratory failure: related COPD exacerbation, and diastolic dysfunction. Evaluated by cardiology. See #2. Continue home lasix. Volume status -896cc. Weight 105.5kg down from 106.1kg yesterday.  Monitor daily weights and intake and output 8. Diastolic dysfunction. See above. Cardiology evaluated. Echo from 6/15 yields normal LV function with EF 55% and grade 2 diastolic  dysfunction   Code Status: full Family Communication: none present Disposition Plan: home hopefully tomorrow   Consultants:  cardiology  Procedures:  none  Antibiotics:  levaquin 08/20/14>>  HPI/Subjective: Awake alert. Reports breathing much better and getting good rest last night  Objective: Filed Vitals:   08/23/14 0800  BP: 117/80  Pulse: 81  Temp:   Resp:     Intake/Output Summary (Last 24 hours) at 08/23/14 0954 Last data filed at 08/22/14 1800  Gross per 24 hour  Intake    480 ml  Output   1000 ml  Net   -520 ml   Filed Weights   08/20/14 1221 08/22/14 1525 08/23/14 0500  Weight: 113.399 kg (250 lb) 106.142 kg (234 lb) 105.507 kg (232 lb 9.6 oz)    Exam:   General:  Obese appears comfortable  Cardiovascular: S1 and S2. i hear no M/G/R. No LE edema  Respiratory: normal effort somewhat shallow but much improved air flow. Rhonchi and wheezing most pronounced RLL otherwise BS much cleared. No crackles  Abdomen: obese soft +BS non-tender to palpation  Musculoskeletal: no clubbing or cyanosis   Data Reviewed: Basic Metabolic Panel:  Recent Labs Lab 08/20/14 1540 08/21/14 0620 08/23/14 0619  NA 141 140 141  K 4.4 4.4 3.7  CL 101 101 92*  CO2 30 30 39*  GLUCOSE 97 190* 294*  BUN 8 8 22   CREATININE 0.59 0.58 0.81  CALCIUM 8.9 8.5 8.3*   Liver Function Tests:  Recent Labs Lab 08/21/14 0620  AST 43*  ALT 30  ALKPHOS 80  BILITOT 0.7  PROT 7.2  ALBUMIN 3.3*   No results found for this basename: LIPASE, AMYLASE,  in the last 168 hours No results found for this basename: AMMONIA,  in  the last 168 hours CBC:  Recent Labs Lab 08/20/14 1540 08/21/14 0620 08/23/14 0619  WBC 7.6 4.5 14.3*  NEUTROABS 4.9  --   --   HGB 13.2 12.8 13.4  HCT 40.8 39.5 40.3  MCV 103.6* 104.2* 102.0*  PLT 196 162 222   Cardiac Enzymes:  Recent Labs Lab 08/20/14 1540 08/20/14 1925 08/20/14 2311 08/21/14 0620  TROPONINI 0.57* 0.31* 0.45* 0.41*    BNP (last 3 results)  Recent Labs  07/21/14 2100 07/23/14 1509 08/20/14 1540  PROBNP 2436.0* 1639.0* 1380.0*   CBG:  Recent Labs Lab 08/22/14 0730 08/22/14 1129 08/22/14 1728 08/22/14 2103 08/23/14 0726  GLUCAP 325* 260* 175* 399* 240*    Recent Results (from the past 240 hour(s))  MRSA PCR SCREENING     Status: Abnormal   Collection Time    08/21/14 12:30 AM      Result Value Ref Range Status   MRSA by PCR POSITIVE (*) NEGATIVE Final   Comment:            The GeneXpert MRSA Assay (FDA     approved for NASAL specimens     only), is one component of a     comprehensive MRSA colonization     surveillance program. It is not     intended to diagnose MRSA     infection nor to guide or     monitor treatment for     MRSA infections.     RESULT CALLED TO, READ BACK BY AND VERIFIED WITH:     Iran Sizer [id number] ON [id number] BY FORSYTH K     Studies: No results found.  Scheduled Meds: . amLODipine  5 mg Oral Daily  . antiseptic oral rinse  7 mL Mouth Rinse BID  . budesonide-formoterol  2 puff Inhalation BID  . Chlorhexidine Gluconate Cloth  6 each Topical Q0600  . furosemide  80 mg Oral BID  . insulin aspart  0-20 Units Subcutaneous TID WC  . insulin aspart  0-5 Units Subcutaneous QHS  . ipratropium-albuterol  3 mL Nebulization Q4H  . levofloxacin  500 mg Oral Daily  . metoprolol succinate  25 mg Oral Daily  . mupirocin ointment  1 application Nasal BID  . pantoprazole  40 mg Oral Daily  . predniSONE  50 mg Oral BID WC  . sodium chloride  3 mL Intravenous Q12H   Continuous Infusions:   Principal Problem:   COPD exacerbation Active Problems:   Chest pain   Atrial fibrillation   Hepatic cirrhosis due to chronic hepatitis C infection   Depression with anxiety   Elevated troponin I level   Chronic anticoagulation   Chronic respiratory failure   Hyperglycemia   Acute-on-chronic respiratory failure    Time spent: 35 minutes    [facility] M  [facility] Pager [phone]. If 7PM-7AM, please contact night-coverage at [email or web address], password [facility] 08/23/2014, 9:54 AM  LOS: 3 days

## 2014-08-23 NOTE — Progress Notes (Signed)
The patient was seen and examined. Her chart, laboratory studies, and vital signs were reviewed. She was discussed with nurse practitioner, Ms. Vedia Coffer. Agree with her findings with additions below.  On exam, the patient has significantly fewer wheezes and crackles compared to yesterday. We'll continue scheduled albuterol/Atrovent nebulizers and prednisone. Will taper prednisone accordingly. NovoLog insulin has been added with meals. We'll add Levemir twice a day for better glucose control. Although her hemoglobin A1c was 5.8, this certainly makes her at risk of developing diabetes, type II or steroid-induced from recurrent therapy for COPD exacerbations. We'll continue to monitor. Her CO2 (bicarbonate) is elevated which could be secondary to contraction alkalosis or compensatory from respiratory acidosis. I favor the former because she is breathing much better. For this reason, we will change Lasix to 80 mg daily and start gentle IV fluids today and overnight. If her CO2 improves, then the alkalosis was likely contraction instead of compensatory. If her CO2 worsens, we'll order an ABG.  As stated by Ms. Black, the patient is refusing all anticoagulants with exception of Coumadin. This is problematic, given her history of noncompliance. Coumadin will be restarted. Case management has scheduled her for outpatient followup with [facility].  On cardiac exam, I auscultate irregular rhythm and rate.

## 2014-08-24 DIAGNOSIS — J9620 Acute and chronic respiratory failure, unspecified whether with hypoxia or hypercapnia: Secondary | ICD-10-CM

## 2014-08-24 LAB — PROTIME-INR
INR: 1.26 (ref 0.00–1.49)
PROTHROMBIN TIME: 15.8 s — AB (ref 11.6–15.2)

## 2014-08-24 LAB — CBC
HCT: 40.2 % (ref 36.0–46.0)
HEMOGLOBIN: 13.4 g/dL (ref 12.0–15.0)
MCH: 33.7 pg (ref 26.0–34.0)
MCHC: 33.3 g/dL (ref 30.0–36.0)
MCV: 101 fL — ABNORMAL HIGH (ref 78.0–100.0)
PLATELETS: 221 10*3/uL (ref 150–400)
RBC: 3.98 MIL/uL (ref 3.87–5.11)
RDW: 14.6 % (ref 11.5–15.5)
WBC: 15.6 10*3/uL — AB (ref 4.0–10.5)

## 2014-08-24 LAB — BASIC METABOLIC PANEL
ANION GAP: 11 (ref 5–15)
BUN: 21 mg/dL (ref 6–23)
CALCIUM: 7.9 mg/dL — AB (ref 8.4–10.5)
CHLORIDE: 92 meq/L — AB (ref 96–112)
CO2: 35 mEq/L — ABNORMAL HIGH (ref 19–32)
Creatinine, Ser: 0.66 mg/dL (ref 0.50–1.10)
GFR calc Af Amer: >90 mL/min (ref >90)
GFR calc non Af Amer: >90 mL/min (ref >90)
GLUCOSE: 242 mg/dL — AB (ref 70–99)
POTASSIUM: 3.7 meq/L (ref 3.7–5.3)
Sodium: 138 mEq/L (ref 137–147)

## 2014-08-24 LAB — GLUCOSE, CAPILLARY
GLUCOSE-CAPILLARY: 406 mg/dL — AB (ref 70–99)
Glucose-Capillary: 167 mg/dL — ABNORMAL HIGH (ref 70–99)
Glucose-Capillary: 397 mg/dL — ABNORMAL HIGH (ref 70–99)
Glucose-Capillary: 283 mg/dL — ABNORMAL HIGH (ref 70–99)

## 2014-08-24 MED ORDER — METFORMIN HCL 500 MG PO TABS: 500.0000 mg | ORAL_TABLET | Freq: Every day | ORAL | Status: AC

## 2014-08-24 MED ORDER — METFORMIN HCL 500 MG PO TABS
500.0000 mg | ORAL_TABLET | Freq: Every day | ORAL | Status: DC
  Administered 2014-08-24: 500 mg via ORAL
  Filled 2014-08-24: qty 1

## 2014-08-24 MED ORDER — FUROSEMIDE 80 MG PO TABS: 80.0000 mg | ORAL_TABLET | Freq: Every day | ORAL | Status: DC

## 2014-08-24 MED ORDER — SODIUM CHLORIDE 0.9 % IV BOLUS (SEPSIS)
500.0000 mL | Freq: Once | INTRAVENOUS | Status: AC
  Administered 2014-08-24: 500 mL via INTRAVENOUS

## 2014-08-24 MED ORDER — WARFARIN SODIUM 5 MG PO TABS: 7.5000 mg | ORAL_TABLET | Freq: Once | ORAL | Status: DC

## 2014-08-24 MED ORDER — IPRATROPIUM-ALBUTEROL 0.5-2.5 (3) MG/3ML IN SOLN: 3.0000 mL | Freq: Three times a day (TID) | RESPIRATORY_TRACT | Status: DC

## 2014-08-24 MED ORDER — LEVOFLOXACIN 500 MG PO TABS: 500.0000 mg | ORAL_TABLET | Freq: Every day | ORAL | Status: DC

## 2014-08-24 MED ORDER — INSULIN NPH (HUMAN) (ISOPHANE) 100 UNIT/ML ~~LOC~~ SUSP
10.0000 [IU] | SUBCUTANEOUS | Status: AC
  Administered 2014-08-24: 10 [IU] via SUBCUTANEOUS
  Filled 2014-08-24: qty 10

## 2014-08-24 MED ORDER — WARFARIN SODIUM 4 MG PO TABS: 4.0000 mg | ORAL_TABLET | Freq: Every day | ORAL | Status: DC

## 2014-08-24 MED ORDER — FUROSEMIDE 80 MG PO TABS
80.0000 mg | ORAL_TABLET | Freq: Every day | ORAL | Status: AC
Start: 2014-08-24 — End: ?

## 2014-08-24 MED ORDER — METFORMIN HCL 500 MG PO TABS: 500.0000 mg | ORAL_TABLET | Freq: Every day | ORAL | Status: DC

## 2014-08-24 MED ORDER — PREDNISONE 10 MG PO TABS: ORAL_TABLET | ORAL | Status: DC

## 2014-08-24 MED ORDER — PREDNISONE 20 MG PO TABS: 50.0000 mg | ORAL_TABLET | Freq: Every day | ORAL | Status: DC

## 2014-08-24 MED ORDER — QUETIAPINE FUMARATE 25 MG PO TABS
25.0000 mg | ORAL_TABLET | Freq: Every day | ORAL | Status: AC
Start: 2014-08-24 — End: ?

## 2014-08-24 MED ORDER — POTASSIUM CHLORIDE ER 20 MEQ PO TBCR: 20.0000 meq | EXTENDED_RELEASE_TABLET | Freq: Every day | ORAL | Status: AC

## 2014-08-24 NOTE — Progress Notes (Signed)
ANTIBIOTIC CONSULT NOTE  Pharmacy Consult for Levaquin Indication: COPD exacerbation  Allergies  Allergen Reactions  . Nitroglycerin Other (See Comments)    CAUSED NUMBNESS ALL OVER  . Doxycycline Itching  . Flexeril [Cyclobenzaprine] Other (See Comments)    Sweating, Lightheaded   . Ibuprofen Other (See Comments)    Increase BP, dizziness, lightheaded  . Tramadol Itching  . Tylenol [Acetaminophen] Other (See Comments)    Pt has Hep C  . Vesicare [Solifenacin] Other (See Comments)    Makes my sweat turn yellow  . Hydralazine     Altered mental status, hallucinations, angression  . Amoxil [Amoxicillin] Nausea Only   Patient Measurements: Height:  (160 cm) Weight: 234 lb 12.8 oz (106.505 kg) IBW/kg (Calculated) : 52.4  Vital Signs: Temp: 97.7 F (36.5 C) (09/18 0440) Temp src: Oral (09/18 0440) BP: 139/83 mmHg (09/18 0440) Pulse Rate: 82 (09/18 0440) Intake/Output from previous day: 09/17 0701 - 09/18 0700 In: 720 [P.O.:720] Out: -  Intake/Output from this shift:    Labs:  Recent Labs  08/23/14 0619 08/24/14 0625  WBC 14.3* 15.6*  HGB 13.4 13.4  PLT 222 221  CREATININE 0.81 0.66   Estimated Creatinine Clearance: 99.4 ml/min (by C-G formula based on Cr of 0.66). No results found for this basename: VANCOTROUGH, Leodis Binet, VANCORANDOM, GENTTROUGH, GENTPEAK, GENTRANDOM, TOBRATROUGH, TOBRAPEAK, TOBRARND, AMIKACINPEAK, AMIKACINTROU, AMIKACIN,  in the last 72 hours   Microbiology: Recent Results (from the past 720 hour(s))  MRSA PCR SCREENING     Status: Abnormal   Collection Time    08/21/14 12:30 AM      Result Value Ref Range Status   MRSA by PCR POSITIVE (*) NEGATIVE Final   Comment:            The GeneXpert MRSA Assay (FDA     approved for NASAL specimens     only), is one component of a     comprehensive MRSA colonization     surveillance program. It is not     intended to diagnose MRSA     infection nor to guide or     monitor treatment for    MRSA infections.     RESULT CALLED TO, READ BACK BY AND VERIFIED WITH:     Iran Sizer [id number] ON [id number] BY FORSYTH K   Medical History: Past Medical History  Diagnosis Date  . COPD (chronic obstructive pulmonary disease)     Home O2 3L  . Coronary atherosclerosis of native coronary artery     Cardiac catheterization 07/2012 - LAD 40%; small D2 with 60 to 70% ostial; OM1 30 to 40%; RCA 20%; PL1 40%   . Hypothyroidism   . Hepatitis C   . Pneumonia   . Cirrhosis   . Type 2 diabetes mellitus   . GERD (gastroesophageal reflux disease)   . Headache(784.0)   . Anxiety   . Atrial fibrillation and flutter   . Chronic pain   . PE (pulmonary embolism)   . Chronic anticoagulation   . History of medication noncompliance   . SSS (sick sinus syndrome)     Biotronik pacemaker  . Cardiomyopathy     LVEF 40-45%   Anti-infectives   Start     Dose/Rate Route Frequency Ordered Stop   08/22/14 1000  levofloxacin (LEVAQUIN) tablet 500 mg     500 mg Oral Daily 08/21/14 1732     08/21/14 1300  levofloxacin (LEVAQUIN) IVPB 750 mg  Status:  Discontinued  750 mg 100 mL/hr over 90 Minutes Intravenous Every 24 hours 08/21/14 1230 08/21/14 1732     Assessment: 48yo obese female with COPD EXACERBATION currently on day#4 Levaquin. Patient is clinically improving.   Renal function has been stable.  Normalized CrCl>165ml/min.   Levaquin 9/15>>  Goal of Therapy:  Eradicate infection.  Plan:  Continue Levaquin 500mg  po q24hrs. Duration of therapy per MD- recommend 5-7 days. Pharmacy to sign off.  Please re-consult as needed.   Elson Clan 08/24/2014,8:39 AM

## 2014-08-24 NOTE — Progress Notes (Signed)
Inpatient Diabetes Program Recommendations  AACE/ADA: New Consensus Statement on Inpatient Glycemic Control (2013)  Target Ranges:  Prepandial:   less than 140 mg/dL      Peak postprandial:   less than 180 mg/dL (1-2 hours)      Critically ill patients:  140 - 180 mg/dL   Results for Kim [name], Kim [name] (MRN [id number]) as of 08/24/2014 09:56  Ref. Range 08/23/2014 07:26 08/23/2014 11:53 08/23/2014 16:58 08/23/2014 20:59 08/24/2014 08:20  Glucose-Capillary Latest Range: 70-99 mg/dL 983 (H) 382 (H) 505 (H) 335 (H) 167 (H)   Diabetes history: DM2 Outpatient Diabetes medications:None Current orders for Inpatient glycemic control: Levemir 15 units BID, Novolog 0-20 units AC, Novolog 0-5 units HS, Novolog 3 units TID with meals  Inpatient Diabetes Program Recommendations Insulin - Meal Coverage: If steroids will be continued, please consider increasing meal coverage to Novolog 6 units TID with meals.  Thanks, Orlando Penner, RN, MSN, CCRN Diabetes Coordinator Inpatient Diabetes Program [phone] (Team Pager) [phone] ([place] office) [phone] [facility] office)

## 2014-08-24 NOTE — Progress Notes (Signed)
ANTICOAGULATION CONSULT NOTE  Pharmacy Consult for Coumadin Indication: atrial fibrillation  Allergies  Allergen Reactions  . Nitroglycerin Other (See Comments)    CAUSED NUMBNESS ALL OVER  . Doxycycline Itching  . Flexeril [Cyclobenzaprine] Other (See Comments)    Sweating, Lightheaded   . Ibuprofen Other (See Comments)    Increase BP, dizziness, lightheaded  . Tramadol Itching  . Tylenol [Acetaminophen] Other (See Comments)    Pt has Hep C  . Vesicare [Solifenacin] Other (See Comments)    Makes my sweat turn yellow  . Hydralazine     Altered mental status, hallucinations, angression  . Amoxil [Amoxicillin] Nausea Only   Patient Measurements: Height: 5\' 3"  (160 cm) Weight: 234 lb 12.8 oz (106.505 kg) IBW/kg (Calculated) : 52.4  Vital Signs: Temp: 97.7 F (36.5 C) (09/18 0440) Temp src: Oral (09/18 0440) BP: 139/83 mmHg (09/18 0440) Pulse Rate: 82 (09/18 0440)  Labs:  Recent Labs  08/23/14 0619 08/24/14 0625  HGB 13.4 13.4  HCT 40.3 40.2  PLT 222 221  LABPROT  --  15.8*  INR  --  1.26  CREATININE 0.81 0.66   Estimated Creatinine Clearance: 99.4 ml/min (by C-G formula based on Cr of 0.66).  Medical History: Past Medical History  Diagnosis Date  . COPD (chronic obstructive pulmonary disease)     Home O2 3L  . Coronary atherosclerosis of native coronary artery     Cardiac catheterization 07/2012 - LAD 40%; small D2 with 60 to 70% ostial; OM1 30 to 40%; RCA 20%; PL1 40%   . Hypothyroidism   . Hepatitis C   . Pneumonia   . Cirrhosis   . Type 2 diabetes mellitus   . GERD (gastroesophageal reflux disease)   . Headache(784.0)   . Anxiety   . Atrial fibrillation and flutter   . Chronic pain   . PE (pulmonary embolism)   . Chronic anticoagulation   . History of medication noncompliance   . SSS (sick sinus syndrome)     Biotronik pacemaker  . Cardiomyopathy     LVEF 40-45%   Assessment: 48yo female with h/o afib.  Pt had been transitioned to Eliquis  recently but is now refusing Eliquis stating that it causes N/V.  Also refused attempt to transition to Xarelto. Pt is being placed back on Warfarin.  INR rising to goal.  No bleeding noted.   Goal of Therapy:  INR 2-3   Plan:   Coumadin 7.5mg  po today x 1 dose  INR daily  Elson Clan 08/24/2014,8:37 AM

## 2014-08-24 NOTE — Discharge Summary (Signed)
Physician Discharge Summary  JALON BLACKWELDER ZOX:[id number] DOB: [birth year] DOA: 08/20/2014  PCP: No PCP Per Patient  Admit date: 08/20/2014 Discharge date: 08/24/2014  Time spent:  minutes  Recommendations for Outpatient Follow-up:  1. Has appointment with at [facility] on 08/31/14 for evaluation of respiratory status and serum glucose. INR will be checked and coumadin dosed by coumadin clinic at [facility]. 2. HH RN for skilled observation/assessment of condition, medication compliance, follow up compliance. HH on 9/21 scheduled to draw labs to check INR .  Discharge Diagnoses:  Principal Problem:   COPD exacerbation Active Problems:   Chest pain   Atrial fibrillation   Hepatic cirrhosis due to chronic hepatitis C infection   Depression with anxiety   Elevated troponin I level   Chronic anticoagulation   Chronic respiratory failure   Acute on chronic diastolic congestive heart failure   Hyperglycemia   Acute-on-chronic respiratory failure   Discharge Condition: stable  Diet recommendation: carb modified heart health  Filed Weights   08/22/14 1525 08/23/14 0500 08/24/14 0440  Weight: 106.142 kg (234 lb) 105.507 kg (232 lb 9.6 oz) 106.505 kg (234 lb 12.8 oz)    History of present illness:  This is a 48 year old lady who came to ED on 08/20/14 complaining of dyspnea and chest pain which had been going on for about 2 days. She has a history of chest pain previously with elevated troponin levels. She does have a low risk Cardiolite stress test previously. She had not had cardiac catheterization. She also had a cough which was productive of blackish sputum, according to her history. She is on home oxygen.  Hospital Course:  1. COPD exacerbation: admitted to floor and provided with steroids, antibiotics and prn nebs. She failed to improve so nebs changed to scheduled and steroids increased. She quickly improved at this point. BS with improved air flow fewer wheeze and less rhonchi.  with  scheduled nebs and prednisone.  Oxygen saturation level 95% on room air and 99% on 2L at discharge.  Of note, her CO2 was elevated most likely related to contraction alkalosis vs compensatory from respiratory acidosis as CO2 improved with gently IV hydration, and a decrease in lasix at discharge. Will discharge with lower dose of lasix than home dose. She will be discharged with nebulizer ordered TID, prednisone taper and 4 more days of Levequin to complete a 7 day course. She will have [facility] RN visits. She is scheduled for follow up 9/25 at [facility] 2. Chest pain with elevated troponin level: less chest pain. ACS ruled out. Troponin chronically elevated. Evaluated by cardiology who opine troponin trending down in patient with low risk stress test in July, non-obstructive CAD per cath 2013. Cardiology recommended continuing BB and increasing amlodipine.  3. Afib: rate controlled. Refusing xarelto and requesting coumadin. Discussed the importance of compliance with coumadin as well as frequent checks. She verbalized understanding.Marland Kitchen History of non-compliance so coumadin not first choice but patient refusing other xarelto and Eliquis. INR at discharge 1.26. She received 7.5mg  on day of discharge. Recommend resuming  home dose starting 08/25/14. INR to be drawn 08/27/14 by [facility].  4. Hx of PE. Ct 4/15 negative for PE. On anticoagulation with some concern for compliance. See #3.  5. Depression/anxiety: remained stable during this hospitalization. Continue seroquel at lower dose 6. Hyperglycemia: likely related to steroids. She was provided with SSIa nd Levemir during hospitalization for optimal control. Her  A1c 5.8. Anticipate better control with tapering of steroids. However, HgA1c  does indicate prediabetes either type II or steroid induced from recurrent therapy for COPD exacerbations. Will discharge on metformin.  Recommend close OP follow up.  7. Acute on chronic respiratory failure: related COPD exacerbation,  and diastolic dysfunction. Evaluated by cardiology. See #2. Home lasix at lower dose. Weight 106.5kg up slightly from 106.1kg on admission. Instructed to weigh daily and call MD if 2lb or more weight gain in 24 hours. She has follow up appointment cardiology NP 09/12/14.  8. Diastolic dysfunction. See above. Cardiology evaluated. Echo from 6/15 yields normal LV function with EF 55% and grade 2 diastolic dysfunction     Procedures:  none  Consultations:  cardiology  Discharge Exam: Filed Vitals:   08/24/14 1501  BP: 139/80  Pulse: 89  Temp: 97.7 F (36.5 C)  Resp: 20    General: obese sitting up in bed appears comfortable Cardiovascular: S1 and S2 no MGR No LE edema Respiratory: normal effort BS with improved air flow. Continues with some rhonchi bilateral bases and faint wheeze.   Discharge Instructions You were cared for by a hospitalist during your hospital stay. If you have any questions about your discharge medications or the care you received while you were in the hospital after you are discharged, you can call the unit and asked to speak with the hospitalist on call if the hospitalist that took care of you is not available. Once you are discharged, your primary care physician will handle any further medical issues. Please note that NO REFILLS for any discharge medications will be authorized once you are discharged, as it is imperative that you return to your primary care physician (or establish a relationship with a primary care physician if you do not have one) for your aftercare needs so that they can reassess your need for medications and monitor your lab values.   Current Discharge Medication List    START taking these medications   Details  ipratropium-albuterol (DUONEB) 0.5-2.5 (3) MG/3ML SOLN Take 3 mLs by nebulization 3 (three) times daily. Take neb in morning afternoon and at night Qty: 360 mL, Refills: 1    levofloxacin (LEVAQUIN) 500 MG tablet Take 1 tablet (500  mg total) by mouth daily. Qty: 4 tablet, Refills: 0    metFORMIN (GLUCOPHAGE) 500 MG tablet Take 1 tablet (500 mg total) by mouth at bedtime. Qty: 30 tablet, Refills: 1    potassium chloride 20 MEQ TBCR Take 20 mEq by mouth daily. Take with furosemide (Lasix) Qty: 30 tablet, Refills: 2    predniSONE (DELTASONE) 10 MG tablet Take 6 tabs for 2 days starting 08/25/14 then take 5 tabs for 2 days then take 4 tabs for 2 days then take 3 tabs for 2 days then take 2 tabs for 2 days then take 1 tab for 2 days then stop. Qty: 42 tablet, Refills: 0      CONTINUE these medications which have CHANGED   Details  furosemide (LASIX) 80 MG tablet Take 1 tablet (80 mg total) by mouth daily. Qty: 30 tablet, Refills: 2    QUEtiapine (SEROQUEL) 25 MG tablet Take 1 tablet (25 mg total) by mouth at bedtime. Qty: 30 tablet, Refills: 1    warfarin (COUMADIN) 4 MG tablet Take 1 tablet (4 mg total) by mouth daily. Qty: 30 tablet, Refills: 1      CONTINUE these medications which have NOT CHANGED   Details  albuterol (PROVENTIL HFA;VENTOLIN HFA) 108 (90 BASE) MCG/ACT inhaler Inhale 2 puffs into the lungs every 6 (six)  hours as needed for wheezing or shortness of breath.     amLODipine (NORVASC) 2.5 MG tablet Take 1 tablet (2.5 mg total) by mouth daily. Qty: 30 tablet, Refills: 0    budesonide-formoterol (SYMBICORT) 80-4.5 MCG/ACT inhaler Inhale 2 puffs into the lungs 2 (two) times daily.    hydrOXYzine (VISTARIL) 25 MG capsule Take 25 mg by mouth 3 (three) times daily as needed for anxiety or itching.    metoprolol succinate (TOPROL-XL) 25 MG 24 hr tablet Take 1 tablet (25 mg total) by mouth daily. Qty: 25 tablet, Refills: 0    pantoprazole (PROTONIX) 40 MG tablet Take 40 mg by mouth daily.      STOP taking these medications     tiotropium (SPIRIVA) 18 MCG inhalation capsule        Allergies  Allergen Reactions  . Nitroglycerin Other (See Comments)    CAUSED NUMBNESS ALL OVER  . Doxycycline  Itching  . Flexeril [Cyclobenzaprine] Other (See Comments)    Sweating, Lightheaded   . Ibuprofen Other (See Comments)    Increase BP, dizziness, lightheaded  . Tramadol Itching  . Tylenol [Acetaminophen] Other (See Comments)    Pt has Hep C  . Vesicare [Solifenacin] Other (See Comments)    Makes my sweat turn yellow  . Hydralazine     Altered mental status, hallucinations, angression  . Amoxil [Amoxicillin] Nausea Only   Follow-up Information   Follow up On 08/31/2014. (at 9:00)    Contact information:   [facility] [address] [place], [place] [address] [phone]      Follow up On 08/27/2014. (at 1:10)    Contact information:   [facility]       The results of significant diagnostics from this hospitalization (including imaging, microbiology, ancillary and laboratory) are listed below for reference.    Significant Diagnostic Studies: Dg Chest 2 View  08/20/2014   CLINICAL DATA:  Shortness of breath, chest pain.  EXAM: CHEST  2 VIEW  COMPARISON:  July 24, 2014.  FINDINGS: The heart size and mediastinal contours are within normal limits. Both lungs are clear. Left-sided pacemaker is unchanged in position. No pneumothorax or pleural effusion is noted. The visualized skeletal structures are unremarkable.  IMPRESSION: No acute cardiopulmonary abnormality seen.   Electronically Signed   By: Roque Lias M.D.   On: 08/20/2014 16:31    Microbiology: Recent Results (from the past 240 hour(s))  MRSA PCR SCREENING     Status: Abnormal   Collection Time    08/21/14 12:30 AM      Result Value Ref Range Status   MRSA by PCR POSITIVE (*) NEGATIVE Final   Comment:            The GeneXpert MRSA Assay (FDA     approved for NASAL specimens     only), is one component of a     comprehensive MRSA colonization     surveillance program. It is not     intended to diagnose MRSA     infection nor to guide or     monitor treatment for     MRSA  infections.     RESULT CALLED TO, READ BACK BY AND VERIFIED WITH:     Frutoso Chase ON [id number] BY FORSYTH K     Labs: Basic Metabolic Panel:  Recent Labs Lab 08/20/14 1540 08/21/14 0620 08/23/14 0619 08/24/14 0625  NA 141 140 141 138  K 4.4 4.4 3.7 3.7  CL 101 101 92* 92*  CO2 30 30 39* 35*  GLUCOSE 97 190* 294* 242*  BUN CREATININE 0.59 0.58 0.81 0.66  CALCIUM 8.9 8.5 8.3* 7.9*   Liver Function Tests:  Recent Labs Lab 08/21/14 0620  AST 43*  ALT 30  ALKPHOS 80  BILITOT 0.7  PROT 7.2  ALBUMIN 3.3*   No results found for this basename: LIPASE, AMYLASE,  in the last 168 hours No results found for this basename: AMMONIA,  in the last 168 hours CBC:  Recent Labs Lab 08/20/14 1540 08/21/14 0620 08/23/14 0619 08/24/14 0625  WBC 7.6 4.5 14.3* 15.6*  NEUTROABS 4.9  --   --   --   HGB 13.2 12.8 13.4 13.4  HCT 40.8 39.5 40.3 40.2  MCV 103.6* 104.2* 102.0* 101.0*  PLT 196 162 222 221   Cardiac Enzymes:  Recent Labs Lab 08/20/14 1540 08/20/14 1925 08/20/14 2311 08/21/14 0620  TROPONINI 0.57* 0.31* 0.45* 0.41*   BNP: BNP (last 3 results)  Recent Labs  07/21/14 2100 07/23/14 1509 08/20/14 1540  PROBNP 2436.0* 1639.0* 1380.0*   CBG:  Recent Labs Lab 08/23/14 1658 08/23/14 2059 08/24/14 0820 08/24/14 1114 08/24/14 1452  GLUCAP 295* 335* 167* 406* 397*       Signed:  BLACK,KAREN M  [name] 08/24/2014, 3:20 PM

## 2014-08-24 NOTE — Discharge Summary (Signed)
The patient was seen and examined. She was discussed with nurse practitioner, Ms. Vedia Coffer. Agree with her assessment and plan. The patient is medically improved and stable for discharge.  1. COPD with exacerbation. Discharged on dual nebs, prednisone taper, Levaquin, and oxygen. 2. Acute on chronic respiratory failure with hypoxia. The patient has oxygen-dependent COPD. 3. Chest pain with chronically elevated troponin I. Per cardiology, the patient had a low risk stress test in July 2015 and nonobstructive coronary artery disease per cardiac catheterization in 2013. Recommend continued medical treatment. 4. Chronic atrial fibrillation. The patient refused both Xarelto and Eliquis the cause of reported/subjective intolerances. She was discharged on warfarin although she has not been compliant with warfarin in the past. 5. Chronic diastolic dysfunction with grade 2 diastolic dysfunction and an EF of 55% per echocardiogram in June 2015. No evidence of decompensated heart failure. She was discharged on a lower dose of Lasix at 80 mg daily because of contraction alkalosis on twice a day dosing. This will need to BE reassessed in the outpatient setting. 6. History of PE. CT angiogram of the chest in April 2015 was negative for PE. 7. Hyperglycemia, likely steroid induced, but with a hemoglobin A1c of 5.8, she likely has prediabetes. Metformin prescribed at the time of discharge. 8. Depression/anxiety. Seroquel dosing was decreased due to to sedation in the hospital. 9. Noncompliance with medical therapy and followup. The patient was strongly admonished to take her medications as prescribed and to followup with all of the specialists physicians and health care providers at the clinic. Case manager was instrumental in obtaining followup appointments and assistance with medications.

## 2014-08-24 NOTE — Progress Notes (Signed)
CRITICAL VALUE ALERT  Critical value received:  CBG 406  Date of notification:  08/24/14  Time of notification:  1115  Critical value read back:Yes.    Nurse who received alert:  Sammuel Bailiff, RN   MD notified (1st page):  Dr. Sherrie Mustache  Time of first page:  1117  MD notified (2nd page):  Time of second page:  Responding MD:  Dr. Sherrie Mustache  Time MD responded:  1145  MD ordered RN to administer highest sliding scale insulin and new NPH orders added to glucose coverage.

## 2014-08-29 ENCOUNTER — Ambulatory Visit (INDEPENDENT_AMBULATORY_CARE_PROVIDER_SITE_OTHER): Payer: Medicaid Other | Admitting: *Deleted

## 2014-08-29 DIAGNOSIS — I4891 Unspecified atrial fibrillation: Secondary | ICD-10-CM

## 2014-08-29 DIAGNOSIS — Z5181 Encounter for therapeutic drug level monitoring: Secondary | ICD-10-CM

## 2014-08-29 LAB — POCT INR: INR: 1

## 2014-09-03 ENCOUNTER — Encounter (HOSPITAL_COMMUNITY): Payer: Self-pay | Admitting: Emergency Medicine

## 2014-09-03 DIAGNOSIS — K219 Gastro-esophageal reflux disease without esophagitis: Secondary | ICD-10-CM | POA: Diagnosis not present

## 2014-09-03 DIAGNOSIS — G8929 Other chronic pain: Secondary | ICD-10-CM | POA: Insufficient documentation

## 2014-09-03 DIAGNOSIS — R1013 Epigastric pain: Secondary | ICD-10-CM | POA: Diagnosis not present

## 2014-09-03 DIAGNOSIS — F17200 Nicotine dependence, unspecified, uncomplicated: Secondary | ICD-10-CM | POA: Insufficient documentation

## 2014-09-03 DIAGNOSIS — Z7901 Long term (current) use of anticoagulants: Secondary | ICD-10-CM | POA: Insufficient documentation

## 2014-09-03 DIAGNOSIS — J4489 Other specified chronic obstructive pulmonary disease: Secondary | ICD-10-CM | POA: Insufficient documentation

## 2014-09-03 DIAGNOSIS — E119 Type 2 diabetes mellitus without complications: Secondary | ICD-10-CM | POA: Diagnosis not present

## 2014-09-03 DIAGNOSIS — Z8669 Personal history of other diseases of the nervous system and sense organs: Secondary | ICD-10-CM | POA: Insufficient documentation

## 2014-09-03 DIAGNOSIS — Z9089 Acquired absence of other organs: Secondary | ICD-10-CM | POA: Insufficient documentation

## 2014-09-03 DIAGNOSIS — I4891 Unspecified atrial fibrillation: Secondary | ICD-10-CM | POA: Insufficient documentation

## 2014-09-03 DIAGNOSIS — J449 Chronic obstructive pulmonary disease, unspecified: Secondary | ICD-10-CM | POA: Diagnosis not present

## 2014-09-03 DIAGNOSIS — F411 Generalized anxiety disorder: Secondary | ICD-10-CM | POA: Insufficient documentation

## 2014-09-03 DIAGNOSIS — Z86711 Personal history of pulmonary embolism: Secondary | ICD-10-CM | POA: Insufficient documentation

## 2014-09-03 DIAGNOSIS — Z79899 Other long term (current) drug therapy: Secondary | ICD-10-CM | POA: Diagnosis not present

## 2014-09-03 DIAGNOSIS — Z8619 Personal history of other infectious and parasitic diseases: Secondary | ICD-10-CM | POA: Diagnosis not present

## 2014-09-03 DIAGNOSIS — R109 Unspecified abdominal pain: Secondary | ICD-10-CM | POA: Insufficient documentation

## 2014-09-03 DIAGNOSIS — Z9889 Other specified postprocedural states: Secondary | ICD-10-CM | POA: Diagnosis not present

## 2014-09-03 DIAGNOSIS — Z9861 Coronary angioplasty status: Secondary | ICD-10-CM | POA: Insufficient documentation

## 2014-09-03 DIAGNOSIS — Z86718 Personal history of other venous thrombosis and embolism: Secondary | ICD-10-CM | POA: Diagnosis not present

## 2014-09-03 DIAGNOSIS — Z8701 Personal history of pneumonia (recurrent): Secondary | ICD-10-CM | POA: Diagnosis not present

## 2014-09-03 DIAGNOSIS — Z950 Presence of cardiac pacemaker: Secondary | ICD-10-CM | POA: Insufficient documentation

## 2014-09-03 DIAGNOSIS — I2510 Atherosclerotic heart disease of native coronary artery without angina pectoris: Secondary | ICD-10-CM | POA: Insufficient documentation

## 2014-09-03 DIAGNOSIS — Z792 Long term (current) use of antibiotics: Secondary | ICD-10-CM | POA: Insufficient documentation

## 2014-09-03 NOTE — ED Notes (Signed)
Pt c/o abdominal pain and cramping all day and states she has been vomiting up blood

## 2014-09-04 ENCOUNTER — Emergency Department (HOSPITAL_COMMUNITY)
Admission: EM | Admit: 2014-09-04 | Discharge: 2014-09-04 | Disposition: A | Discharge status: Home / Self Care | Payer: Medicaid Other | Attending: Emergency Medicine | Admitting: Emergency Medicine

## 2014-09-04 DIAGNOSIS — R1013 Epigastric pain: Secondary | ICD-10-CM

## 2014-09-04 LAB — COMPREHENSIVE METABOLIC PANEL
ALT: 19 U/L (ref 0–35)
AST: 31 U/L (ref 0–37)
Albumin: 4 g/dL (ref 3.5–5.2)
Alkaline Phosphatase: 76 U/L (ref 39–117)
Anion gap: 14 (ref 5–15)
BUN: 8 mg/dL (ref 6–23)
CALCIUM: 9.2 mg/dL (ref 8.4–10.5)
CO2: 32 meq/L (ref 19–32)
Chloride: 93 mEq/L — ABNORMAL LOW (ref 96–112)
Creatinine, Ser: 0.97 mg/dL (ref 0.50–1.10)
GFR calc Af Amer: 78 mL/min — ABNORMAL LOW (ref >90)
GFR, EST NON AFRICAN AMERICAN: 67 mL/min — AB (ref >90)
Glucose, Bld: 90 mg/dL (ref 70–99)
Potassium: 4 mEq/L (ref 3.7–5.3)
SODIUM: 139 meq/L (ref 137–147)
Total Bilirubin: 1.1 mg/dL (ref 0.3–1.2)
Total Protein: 9.1 g/dL — ABNORMAL HIGH (ref 6.0–8.3)

## 2014-09-04 LAB — LIPASE, BLOOD: Lipase: 28 U/L (ref 11–59)

## 2014-09-04 LAB — CBC WITH DIFFERENTIAL/PLATELET
Basophils Absolute: 0 10*3/uL (ref 0.0–0.1)
Basophils Relative: 0 % (ref 0–1)
EOS PCT: 1 % (ref 0–5)
Eosinophils Absolute: 0.1 10*3/uL (ref 0.0–0.7)
HEMATOCRIT: 43.5 % (ref 36.0–46.0)
Hemoglobin: 14.6 g/dL (ref 12.0–15.0)
LYMPHS PCT: 19 % (ref 12–46)
Lymphs Abs: 2.2 10*3/uL (ref 0.7–4.0)
MCH: 33.3 pg (ref 26.0–34.0)
MCHC: 33.6 g/dL (ref 30.0–36.0)
MCV: 99.3 fL (ref 78.0–100.0)
MONO ABS: 1.1 10*3/uL — AB (ref 0.1–1.0)
Monocytes Relative: 9 % (ref 3–12)
Neutro Abs: 8.3 10*3/uL — ABNORMAL HIGH (ref 1.7–7.7)
Neutrophils Relative %: 71 % (ref 43–77)
Platelets: 230 10*3/uL (ref 150–400)
RBC: 4.38 MIL/uL (ref 3.87–5.11)
RDW: 14.6 % (ref 11.5–15.5)
WBC: 11.7 10*3/uL — AB (ref 4.0–10.5)

## 2014-09-04 LAB — URINALYSIS, ROUTINE W REFLEX MICROSCOPIC
Bilirubin Urine: NEGATIVE
Glucose, UA: NEGATIVE mg/dL
HGB URINE DIPSTICK: NEGATIVE
Ketones, ur: NEGATIVE mg/dL
LEUKOCYTES UA: NEGATIVE
Nitrite: NEGATIVE
PROTEIN: NEGATIVE mg/dL
Specific Gravity, Urine: 1.01 (ref 1.005–1.030)
Urobilinogen, UA: 2 mg/dL — ABNORMAL HIGH (ref 0.0–1.0)
pH: 6 (ref 5.0–8.0)

## 2014-09-04 LAB — PROTIME-INR
INR: 1.05 (ref 0.00–1.49)
Prothrombin Time: 13.7 seconds (ref 11.6–15.2)

## 2014-09-04 MED ORDER — OMEPRAZOLE 40 MG PO CPDR: 40.0000 mg | DELAYED_RELEASE_CAPSULE | Freq: Every day | ORAL | Status: AC

## 2014-09-04 MED ORDER — SUCRALFATE 1 G PO TABS
1.0000 g | ORAL_TABLET | Freq: Three times a day (TID) | ORAL | Status: DC
  Filled 2014-09-04 (×6): qty 1

## 2014-09-04 MED ORDER — FENTANYL CITRATE 0.05 MG/ML IJ SOLN
100.0000 ug | Freq: Once | INTRAMUSCULAR | Status: AC
  Administered 2014-09-04: 100 ug via INTRAVENOUS
  Filled 2014-09-04: qty 2

## 2014-09-04 MED ORDER — PANTOPRAZOLE SODIUM 40 MG IV SOLR
40.0000 mg | Freq: Once | INTRAVENOUS | Status: AC
  Administered 2014-09-04: 40 mg via INTRAVENOUS
  Filled 2014-09-04: qty 40

## 2014-09-04 MED ORDER — ONDANSETRON HCL 4 MG/2ML IJ SOLN
4.0000 mg | Freq: Once | INTRAMUSCULAR | Status: AC
  Administered 2014-09-04: 4 mg via INTRAVENOUS
  Filled 2014-09-04: qty 2

## 2014-09-04 NOTE — ED Provider Notes (Addendum)
CSN: [id number]     Arrival date & time 09/03/14  2220 History   First MD Initiated Contact with Patient 09/04/14 0228     Chief Complaint  Patient presents with  . Abdominal Pain     (Consider location/radiation/quality/duration/timing/severity/associated sxs/prior Treatment) HPI This is a 48 year old female with multiple medical problems. She is here with "sharp pain over my entire body" it is primarily located in the epigastrium. She states it is moderate to severe. She is not sure when it started but thinks it was around noon yesterday. She states it hurts to move. She has been nauseated and vomited one time. She denies diarrhea. She has had episodes where she has felt hot and cold but is not aware of having a fever.  Past Medical History  Diagnosis Date  . COPD (chronic obstructive pulmonary disease)     Home O2 3L  . Coronary atherosclerosis of native coronary artery     Cardiac catheterization 07/2012 - LAD 40%; small D2 with 60 to 70% ostial; OM1 30 to 40%; RCA 20%; PL1 40%   . Hypothyroidism   . Hepatitis C   . Pneumonia   . Cirrhosis   . Type 2 diabetes mellitus   . GERD (gastroesophageal reflux disease)   . Headache(784.0)   . Anxiety   . Atrial fibrillation and flutter   . Chronic pain   . PE (pulmonary embolism)   . Chronic anticoagulation   . History of medication noncompliance   . SSS (sick sinus syndrome)     Biotronik pacemaker  . Cardiomyopathy     LVEF 40-45%   Past Surgical History  Procedure Laterality Date  . Cholecystectomy    . Pacemaker insertion  02/2012    Biotronik PPM   . Cardiac electrophysiology study and ablation    . Esophagogastroduodenoscopy  Jan 2010    Dr. Arlyce Dice: multiple gastric erosions, benign path  . Esophagogastroduodenoscopy N/A 01/10/2014    FTD:DUKGURKY EG junction. Query short segment Barrett's esophagus-status post pinch biopsy as described above. Hiatal hernia; otherwise negative EGD   Family History  Problem Relation Age  of Onset  . Coronary artery disease Father 30  . Coronary artery disease Mother 61  . Coronary artery disease Brother   . Colon cancer Neg Hx    History  Substance Use Topics  . Smoking status: Current Every Day Smoker -- 0.50 packs/day for 30 years    Types: Cigarettes  . Smokeless tobacco: Current User    Types: Snuff  . Alcohol Use: No   OB History   Grav Para Term Preterm Abortions TAB SAB Ect Mult Living                 Review of Systems  All other systems reviewed and are negative.   Allergies  Nitroglycerin; Doxycycline; Flexeril; Ibuprofen; Tramadol; Tylenol; Vesicare; Hydralazine; and Amoxil  Home Medications   Prior to Admission medications   Medication Sig Start Date End Date Taking? Authorizing Provider  albuterol (PROVENTIL HFA;VENTOLIN HFA) 108 (90 BASE) MCG/ACT inhaler Inhale 2 puffs into the lungs every 6 (six) hours as needed for wheezing or shortness of breath.     Historical Provider, MD  amLODipine (NORVASC) 2.5 MG tablet Take 1 tablet (2.5 mg total) by mouth daily. 07/01/14   Standley Brooking, MD  budesonide-formoterol [facility]) 80-4.5 MCG/ACT inhaler Inhale 2 puffs into the lungs 2 (two) times daily.    Historical Provider, MD  furosemide (LASIX) 80 MG tablet Take 1 tablet (80  mg total) by mouth daily. 08/24/14   Gwenyth Bender, NP  hydrOXYzine (VISTARIL) 25 MG capsule Take 25 mg by mouth 3 (three) times daily as needed for anxiety or itching.    Historical Provider, MD  ipratropium-albuterol (DUONEB) 0.5-2.5 (3) MG/3ML SOLN Take 3 mLs by nebulization 3 (three) times daily. Take neb in morning afternoon and at night 08/24/14   Gwenyth Bender, NP  levofloxacin (LEVAQUIN) 500 MG tablet Take 1 tablet (500 mg total) by mouth daily. 08/24/14   Gwenyth Bender, NP  metFORMIN (GLUCOPHAGE) 500 MG tablet Take 1 tablet (500 mg total) by mouth at bedtime. 08/24/14   Gwenyth Bender, NP  metoprolol succinate (TOPROL-XL) 25 MG 24 hr tablet Take 1 tablet (25 mg total) by mouth  daily. 06/14/14   Gwenyth Bender, NP  pantoprazole (PROTONIX) 40 MG tablet Take 40 mg by mouth daily.    Historical Provider, MD  potassium chloride 20 MEQ TBCR Take 20 mEq by mouth daily. Take with furosemide (Lasix) 08/24/14   Elliot Cousin, MD  predniSONE (DELTASONE) 10 MG tablet Take 6 tabs for 2 days starting 08/25/14 then take 5 tabs for 2 days then take 4 tabs for 2 days then take 3 tabs for 2 days then take 2 tabs for 2 days then take 1 tab for 2 days then stop. 08/24/14   Gwenyth Bender, NP  QUEtiapine (SEROQUEL) 25 MG tablet Take 1 tablet (25 mg total) by mouth at bedtime. 08/24/14   Gwenyth Bender, NP  warfarin (COUMADIN) 4 MG tablet Take 1 tablet (4 mg total) by mouth daily. 08/24/14   Gwenyth Bender, NP   BP 151/90  Pulse 74  Temp(Src) 97.6 F (36.4 C) (Oral)  Resp 20  Ht 5\' 9"  (1.753 m)  Wt 234 lb (106.142 kg)  BMI 34.54 kg/m2  SpO2 95%  LMP 02/08/2007  Physical Exam General: Well-developed, well-nourished female in no acute distress; appears older than age of record HENT: normocephalic; atraumatic; multiple dental caries Eyes: Pupils equal and round; left reactive to light, right sluggish with cataract; extraocular muscles intact Neck: supple Heart: regular rate and rhythm; distant sounds Lungs: clear to auscultation bilaterally Abdomen: soft; nondistended; epigastric and left upper quadrant tenderness; no masses or hepatosplenomegaly; bowel sounds present Extremities: No deformity; full range of motion; pulses normal Neurologic: Awake, alert and oriented; motor function intact in all extremities and symmetric; no facial droop Skin: Warm and dry Psychiatric: Flat affect    ED Course  Procedures (including critical care time)  MDM   Nursing notes and vitals signs, including pulse oximetry, reviewed.  Summary of this visit's results, reviewed by myself:  Labs:  Results for orders placed during the hospital encounter of 09/04/14 (from the past 24 hour(s))  CBC WITH  DIFFERENTIAL     Status: Abnormal   Collection Time    09/04/14  2:00 AM      Result Value Ref Range   WBC 11.7 (*) 4.0 - 10.5 K/uL   RBC 4.38  3.87 - 5.11 MIL/uL   Hemoglobin 14.6  12.0 - 15.0 g/dL   HCT 95.2  84.1 - 32.4 %   MCV 99.3  78.0 - 100.0 fL   MCH 33.3  26.0 - 34.0 pg   MCHC 33.6  30.0 - 36.0 g/dL   RDW 40.1  02.7 - 25.3 %   Platelets 230  150 - 400 K/uL   Neutrophils Relative % 71  43 - 77 %  Neutro Abs 8.3 (*) 1.7 - 7.7 K/uL   Lymphocytes Relative 19  12 - 46 %   Lymphs Abs 2.2  0.7 - 4.0 K/uL   Monocytes Relative 9  3 - 12 %   Monocytes Absolute 1.1 (*) 0.1 - 1.0 K/uL   Eosinophils Relative 1  0 - 5 %   Eosinophils Absolute 0.1  0.0 - 0.7 K/uL   Basophils Relative 0  0 - 1 %   Basophils Absolute 0.0  0.0 - 0.1 K/uL  COMPREHENSIVE METABOLIC PANEL     Status: Abnormal   Collection Time    09/04/14  2:00 AM      Result Value Ref Range   Sodium 139  137 - 147 mEq/L   Potassium 4.0  3.7 - 5.3 mEq/L   Chloride 93 (*) 96 - 112 mEq/L   CO2 32  19 - 32 mEq/L   Glucose, Bld 90  70 - 99 mg/dL   BUN 8  6 - 23 mg/dL   Creatinine, Ser 1.61  0.50 - 1.10 mg/dL   Calcium 9.2  8.4 - 09.6 mg/dL   Total Protein 9.1 (*) 6.0 - 8.3 g/dL   Albumin 4.0  3.5 - 5.2 g/dL   AST 31  0 - 37 U/L   ALT 19  0 - 35 U/L   Alkaline Phosphatase 76  39 - 117 U/L   Total Bilirubin 1.1  0.3 - 1.2 mg/dL   GFR calc non Af Amer 67 (*) >90 mL/min   GFR calc Af Amer 78 (*) >90 mL/min   Anion gap 14  5 - 15  LIPASE, BLOOD     Status: None   Collection Time    09/04/14  2:00 AM      Result Value Ref Range   Lipase 28  11 - 59 U/L  PROTIME-INR     Status: None   Collection Time    09/04/14  2:00 AM      Result Value Ref Range   Prothrombin Time 13.7  11.6 - 15.2 seconds   INR 1.05  0.00 - 1.49  URINALYSIS, ROUTINE W REFLEX MICROSCOPIC     Status: Abnormal   Collection Time    09/04/14  2:23 AM      Result Value Ref Range   Color, Urine YELLOW  YELLOW   APPearance CLEAR  CLEAR   Specific  Gravity, Urine 1.010  1.005 - 1.030   pH 6.0  5.0 - 8.0   Glucose, UA NEGATIVE  NEGATIVE mg/dL   Hgb urine dipstick NEGATIVE  NEGATIVE   Bilirubin Urine NEGATIVE  NEGATIVE   Ketones, ur NEGATIVE  NEGATIVE mg/dL   Protein, ur NEGATIVE  NEGATIVE mg/dL   Urobilinogen, UA 2.0 (*) 0.0 - 1.0 mg/dL   Nitrite NEGATIVE  NEGATIVE   Leukocytes, UA NEGATIVE  NEGATIVE   4:23 AM Nurse reports patient has been alert, watching TV, talking on the phone and discussing football with her without difficulty. Her demeanor appears to change when I entered the room. She continues to complain of epigastric pain, worse with movement. She has been on omeprazole but admits that she is almost out of this and she has a history of noncompliance with medication. She is amenable to going home at this time and we will restart her omeprazole. There is no laboratory evidence of acute abdominal pathology and the location of her pain is consistent with pain of gastric origin.  She was advised of her subtherapeutic INR  and need for her to contact her primary care physician regarding followup for this.     Hanley SeamenJohn L Leonela Kivi, MD 09/04/14 0424  Hanley SeamenJohn L Cici Rodriges, MD 09/04/14 [phone]

## 2014-09-04 NOTE — ED Notes (Signed)
Discharge instructions given,educated pt on importance of follow up care and need for blood draws and check ups in regard to coumadin therapy. Also discussed importance of maintanance care with PCP.  Informed pt of symptoms for return to ED. pt demonstrated teach back and verbal understanding.

## 2014-09-12 ENCOUNTER — Encounter: Payer: Self-pay | Admitting: Physician Assistant

## 2014-09-23 ENCOUNTER — Emergency Department (HOSPITAL_COMMUNITY): Payer: Medicaid Other

## 2014-09-23 ENCOUNTER — Inpatient Hospital Stay (HOSPITAL_COMMUNITY)
Admission: EM | Admit: 2014-09-23 | Discharge: 2014-09-26 | DRG: 194 | Disposition: A | Discharge status: Home / Self Care | Payer: Medicaid Other | Attending: Internal Medicine | Admitting: Internal Medicine

## 2014-09-23 ENCOUNTER — Encounter (HOSPITAL_COMMUNITY): Payer: Self-pay | Admitting: Emergency Medicine

## 2014-09-23 DIAGNOSIS — I429 Cardiomyopathy, unspecified: Secondary | ICD-10-CM | POA: Diagnosis present

## 2014-09-23 DIAGNOSIS — Z9114 Patient's other noncompliance with medication regimen: Secondary | ICD-10-CM | POA: Diagnosis present

## 2014-09-23 DIAGNOSIS — J189 Pneumonia, unspecified organism: Principal | ICD-10-CM | POA: Diagnosis present

## 2014-09-23 DIAGNOSIS — J440 Chronic obstructive pulmonary disease with acute lower respiratory infection: Secondary | ICD-10-CM | POA: Diagnosis present

## 2014-09-23 DIAGNOSIS — F17210 Nicotine dependence, cigarettes, uncomplicated: Secondary | ICD-10-CM | POA: Diagnosis present

## 2014-09-23 DIAGNOSIS — R748 Abnormal levels of other serum enzymes: Secondary | ICD-10-CM | POA: Diagnosis present

## 2014-09-23 DIAGNOSIS — K089 Disorder of teeth and supporting structures, unspecified: Secondary | ICD-10-CM

## 2014-09-23 DIAGNOSIS — Z9119 Patient's noncompliance with other medical treatment and regimen: Secondary | ICD-10-CM | POA: Diagnosis present

## 2014-09-23 DIAGNOSIS — N179 Acute kidney failure, unspecified: Secondary | ICD-10-CM | POA: Diagnosis not present

## 2014-09-23 DIAGNOSIS — Z950 Presence of cardiac pacemaker: Secondary | ICD-10-CM

## 2014-09-23 DIAGNOSIS — E669 Obesity, unspecified: Secondary | ICD-10-CM | POA: Diagnosis present

## 2014-09-23 DIAGNOSIS — J9610 Chronic respiratory failure, unspecified whether with hypoxia or hypercapnia: Secondary | ICD-10-CM | POA: Diagnosis present

## 2014-09-23 DIAGNOSIS — B182 Chronic viral hepatitis C: Secondary | ICD-10-CM

## 2014-09-23 DIAGNOSIS — I5032 Chronic diastolic (congestive) heart failure: Secondary | ICD-10-CM | POA: Diagnosis present

## 2014-09-23 DIAGNOSIS — B372 Candidiasis of skin and nail: Secondary | ICD-10-CM

## 2014-09-23 DIAGNOSIS — R7989 Other specified abnormal findings of blood chemistry: Secondary | ICD-10-CM | POA: Diagnosis present

## 2014-09-23 DIAGNOSIS — I5023 Acute on chronic systolic (congestive) heart failure: Secondary | ICD-10-CM

## 2014-09-23 DIAGNOSIS — I2510 Atherosclerotic heart disease of native coronary artery without angina pectoris: Secondary | ICD-10-CM | POA: Diagnosis present

## 2014-09-23 DIAGNOSIS — F418 Other specified anxiety disorders: Secondary | ICD-10-CM | POA: Diagnosis present

## 2014-09-23 DIAGNOSIS — K7460 Unspecified cirrhosis of liver: Secondary | ICD-10-CM | POA: Diagnosis present

## 2014-09-23 DIAGNOSIS — L989 Disorder of the skin and subcutaneous tissue, unspecified: Secondary | ICD-10-CM

## 2014-09-23 DIAGNOSIS — E039 Hypothyroidism, unspecified: Secondary | ICD-10-CM | POA: Diagnosis present

## 2014-09-23 DIAGNOSIS — Z6834 Body mass index (BMI) 34.0-34.9, adult: Secondary | ICD-10-CM

## 2014-09-23 DIAGNOSIS — Z8249 Family history of ischemic heart disease and other diseases of the circulatory system: Secondary | ICD-10-CM

## 2014-09-23 DIAGNOSIS — I4891 Unspecified atrial fibrillation: Secondary | ICD-10-CM | POA: Diagnosis present

## 2014-09-23 DIAGNOSIS — E1165 Type 2 diabetes mellitus with hyperglycemia: Secondary | ICD-10-CM | POA: Diagnosis present

## 2014-09-23 DIAGNOSIS — J449 Chronic obstructive pulmonary disease, unspecified: Secondary | ICD-10-CM | POA: Diagnosis present

## 2014-09-23 DIAGNOSIS — E876 Hypokalemia: Secondary | ICD-10-CM | POA: Diagnosis present

## 2014-09-23 DIAGNOSIS — R0600 Dyspnea, unspecified: Secondary | ICD-10-CM

## 2014-09-23 DIAGNOSIS — R739 Hyperglycemia, unspecified: Secondary | ICD-10-CM

## 2014-09-23 DIAGNOSIS — I5033 Acute on chronic diastolic (congestive) heart failure: Secondary | ICD-10-CM

## 2014-09-23 DIAGNOSIS — R197 Diarrhea, unspecified: Secondary | ICD-10-CM

## 2014-09-23 DIAGNOSIS — Z7901 Long term (current) use of anticoagulants: Secondary | ICD-10-CM

## 2014-09-23 DIAGNOSIS — I509 Heart failure, unspecified: Secondary | ICD-10-CM

## 2014-09-23 DIAGNOSIS — R079 Chest pain, unspecified: Secondary | ICD-10-CM | POA: Diagnosis present

## 2014-09-23 DIAGNOSIS — Z9981 Dependence on supplemental oxygen: Secondary | ICD-10-CM

## 2014-09-23 DIAGNOSIS — Z5181 Encounter for therapeutic drug level monitoring: Secondary | ICD-10-CM

## 2014-09-23 DIAGNOSIS — Y95 Nosocomial condition: Secondary | ICD-10-CM | POA: Diagnosis present

## 2014-09-23 DIAGNOSIS — K219 Gastro-esophageal reflux disease without esophagitis: Secondary | ICD-10-CM | POA: Diagnosis present

## 2014-09-23 DIAGNOSIS — I482 Chronic atrial fibrillation, unspecified: Secondary | ICD-10-CM

## 2014-09-23 DIAGNOSIS — J90 Pleural effusion, not elsewhere classified: Secondary | ICD-10-CM

## 2014-09-23 DIAGNOSIS — R0602 Shortness of breath: Secondary | ICD-10-CM | POA: Diagnosis not present

## 2014-09-23 DIAGNOSIS — J441 Chronic obstructive pulmonary disease with (acute) exacerbation: Secondary | ICD-10-CM

## 2014-09-23 DIAGNOSIS — Z86711 Personal history of pulmonary embolism: Secondary | ICD-10-CM

## 2014-09-23 DIAGNOSIS — R778 Other specified abnormalities of plasma proteins: Secondary | ICD-10-CM | POA: Diagnosis present

## 2014-09-23 DIAGNOSIS — I5043 Acute on chronic combined systolic (congestive) and diastolic (congestive) heart failure: Secondary | ICD-10-CM

## 2014-09-23 DIAGNOSIS — R1310 Dysphagia, unspecified: Secondary | ICD-10-CM

## 2014-09-23 DIAGNOSIS — Z720 Tobacco use: Secondary | ICD-10-CM

## 2014-09-23 LAB — CBC WITH DIFFERENTIAL/PLATELET
BASOS ABS: 0 10*3/uL (ref 0.0–0.1)
BASOS PCT: 0 % (ref 0–1)
EOS ABS: 0.1 10*3/uL (ref 0.0–0.7)
Eosinophils Relative: 1 % (ref 0–5)
HCT: 40.4 % (ref 36.0–46.0)
Hemoglobin: 13.1 g/dL (ref 12.0–15.0)
Lymphocytes Relative: 31 % (ref 12–46)
Lymphs Abs: 1.9 10*3/uL (ref 0.7–4.0)
MCH: 33.4 pg (ref 26.0–34.0)
MCHC: 32.4 g/dL (ref 30.0–36.0)
MCV: 103.1 fL — ABNORMAL HIGH (ref 78.0–100.0)
Monocytes Absolute: 0.6 10*3/uL (ref 0.1–1.0)
Monocytes Relative: 10 % (ref 3–12)
NEUTROS PCT: 58 % (ref 43–77)
Neutro Abs: 3.6 10*3/uL (ref 1.7–7.7)
Platelets: 181 10*3/uL (ref 150–400)
RBC: 3.92 MIL/uL (ref 3.87–5.11)
RDW: 15.2 % (ref 11.5–15.5)
WBC: 6.2 10*3/uL (ref 4.0–10.5)

## 2014-09-23 LAB — COMPREHENSIVE METABOLIC PANEL
ALK PHOS: 73 U/L (ref 39–117)
ALT: 25 U/L (ref 0–35)
ANION GAP: 6 (ref 5–15)
AST: 36 U/L (ref 0–37)
Albumin: 3.1 g/dL — ABNORMAL LOW (ref 3.5–5.2)
BILIRUBIN TOTAL: 0.9 mg/dL (ref 0.3–1.2)
BUN: 7 mg/dL (ref 6–23)
CHLORIDE: 101 meq/L (ref 96–112)
CO2: 35 meq/L — AB (ref 19–32)
Calcium: 8.8 mg/dL (ref 8.4–10.5)
Creatinine, Ser: 0.77 mg/dL (ref 0.50–1.10)
Glucose, Bld: 117 mg/dL — ABNORMAL HIGH (ref 70–99)
POTASSIUM: 3.5 meq/L — AB (ref 3.7–5.3)
Sodium: 142 mEq/L (ref 137–147)
Total Protein: 7 g/dL (ref 6.0–8.3)

## 2014-09-23 LAB — URINALYSIS, ROUTINE W REFLEX MICROSCOPIC
Bilirubin Urine: NEGATIVE
Glucose, UA: NEGATIVE mg/dL
KETONES UR: NEGATIVE mg/dL
Nitrite: NEGATIVE
PROTEIN: NEGATIVE mg/dL
Specific Gravity, Urine: 1.015 (ref 1.005–1.030)
UROBILINOGEN UA: 1 mg/dL (ref 0.0–1.0)
pH: 6 (ref 5.0–8.0)

## 2014-09-23 LAB — URINE MICROSCOPIC-ADD ON

## 2014-09-23 LAB — TROPONIN I
Troponin I: 0.53 ng/mL (ref <0.30)
Troponin I: 0.51 ng/mL (ref <0.30)

## 2014-09-23 MED ORDER — VANCOMYCIN HCL 10 G IV SOLR
1500.0000 mg | Freq: Once | INTRAVENOUS | Status: AC
  Administered 2014-09-24: 1500 mg via INTRAVENOUS
  Filled 2014-09-23: qty 1500

## 2014-09-23 MED ORDER — ALBUTEROL SULFATE (2.5 MG/3ML) 0.083% IN NEBU
2.5000 mg | INHALATION_SOLUTION | Freq: Once | RESPIRATORY_TRACT | Status: AC
  Administered 2014-09-23: 2.5 mg via RESPIRATORY_TRACT
  Filled 2014-09-23: qty 3

## 2014-09-23 MED ORDER — FUROSEMIDE 80 MG PO TABS
80.0000 mg | ORAL_TABLET | Freq: Every day | ORAL | Status: DC
  Administered 2014-09-24: 80 mg via ORAL
  Filled 2014-09-23: qty 1

## 2014-09-23 MED ORDER — ALBUTEROL SULFATE (2.5 MG/3ML) 0.083% IN NEBU: 3.0000 mL | INHALATION_SOLUTION | Freq: Four times a day (QID) | RESPIRATORY_TRACT | Status: DC | PRN

## 2014-09-23 MED ORDER — PANTOPRAZOLE SODIUM 40 MG PO TBEC
80.0000 mg | DELAYED_RELEASE_TABLET | Freq: Every day | ORAL | Status: DC
  Administered 2014-09-24 – 2014-09-26 (×3): 80 mg via ORAL
  Filled 2014-09-23 (×3): qty 2

## 2014-09-23 MED ORDER — PANTOPRAZOLE SODIUM 40 MG PO TBEC: 40.0000 mg | DELAYED_RELEASE_TABLET | Freq: Every day | ORAL | Status: DC

## 2014-09-23 MED ORDER — IPRATROPIUM-ALBUTEROL 0.5-2.5 (3) MG/3ML IN SOLN
3.0000 mL | Freq: Once | RESPIRATORY_TRACT | Status: AC
  Administered 2014-09-23: 3 mL via RESPIRATORY_TRACT
  Filled 2014-09-23: qty 3

## 2014-09-23 MED ORDER — HYDROXYZINE HCL 25 MG PO TABS: 25.0000 mg | ORAL_TABLET | Freq: Three times a day (TID) | ORAL | Status: DC | PRN

## 2014-09-23 MED ORDER — POTASSIUM CHLORIDE CRYS ER 10 MEQ PO TBCR
20.0000 meq | EXTENDED_RELEASE_TABLET | Freq: Every day | ORAL | Status: DC
  Administered 2014-09-24 – 2014-09-26 (×3): 20 meq via ORAL
  Filled 2014-09-23 (×5): qty 2

## 2014-09-23 MED ORDER — DEXTROSE 5 % IV SOLN
1.0000 g | Freq: Three times a day (TID) | INTRAVENOUS | Status: DC
  Administered 2014-09-24 – 2014-09-26 (×7): 1 g via INTRAVENOUS
  Filled 2014-09-23 (×11): qty 1

## 2014-09-23 MED ORDER — BUDESONIDE-FORMOTEROL FUMARATE 80-4.5 MCG/ACT IN AERO
2.0000 | INHALATION_SPRAY | Freq: Two times a day (BID) | RESPIRATORY_TRACT | Status: DC
  Administered 2014-09-24 – 2014-09-26 (×4): 2 via RESPIRATORY_TRACT
  Filled 2014-09-23: qty 6.9

## 2014-09-23 MED ORDER — AMLODIPINE BESYLATE 5 MG PO TABS
2.5000 mg | ORAL_TABLET | Freq: Every day | ORAL | Status: DC
  Administered 2014-09-24 – 2014-09-26 (×3): 2.5 mg via ORAL
  Filled 2014-09-23 (×3): qty 1

## 2014-09-23 MED ORDER — CEFEPIME HCL 1 G IJ SOLR
1.0000 g | Freq: Once | INTRAMUSCULAR | Status: AC
  Administered 2014-09-23: 1 g via INTRAVENOUS
  Filled 2014-09-23: qty 1

## 2014-09-23 MED ORDER — FUROSEMIDE 10 MG/ML IJ SOLN
40.0000 mg | Freq: Once | INTRAMUSCULAR | Status: AC
  Administered 2014-09-23: 40 mg via INTRAVENOUS
  Filled 2014-09-23: qty 4

## 2014-09-23 MED ORDER — ONDANSETRON HCL 4 MG/2ML IJ SOLN
4.0000 mg | Freq: Once | INTRAMUSCULAR | Status: AC
  Administered 2014-09-23: 4 mg via INTRAVENOUS
  Filled 2014-09-23: qty 2

## 2014-09-23 MED ORDER — METFORMIN HCL 500 MG PO TABS
500.0000 mg | ORAL_TABLET | Freq: Every day | ORAL | Status: DC
  Administered 2014-09-24 – 2014-09-25 (×3): 500 mg via ORAL
  Filled 2014-09-23 (×3): qty 1

## 2014-09-23 MED ORDER — HEPARIN (PORCINE) IN NACL 100-0.45 UNIT/ML-% IJ SOLN: 1200.0000 [IU]/h | INTRAMUSCULAR | Status: DC

## 2014-09-23 MED ORDER — WARFARIN SODIUM 4 MG PO TABS: 4.0000 mg | ORAL_TABLET | Freq: Every day | ORAL | Status: DC

## 2014-09-23 MED ORDER — ALBUTEROL SULFATE HFA 108 (90 BASE) MCG/ACT IN AERS
2.0000 | INHALATION_SPRAY | Freq: Four times a day (QID) | RESPIRATORY_TRACT | Status: DC | PRN
  Filled 2014-09-23: qty 6.7

## 2014-09-23 MED ORDER — MORPHINE SULFATE 4 MG/ML IJ SOLN
4.0000 mg | Freq: Once | INTRAMUSCULAR | Status: AC
  Administered 2014-09-23: 4 mg via INTRAVENOUS
  Filled 2014-09-23: qty 1

## 2014-09-23 MED ORDER — QUETIAPINE FUMARATE 25 MG PO TABS
25.0000 mg | ORAL_TABLET | Freq: Every day | ORAL | Status: DC
  Administered 2014-09-24 – 2014-09-25 (×3): 25 mg via ORAL
  Filled 2014-09-23 (×5): qty 1

## 2014-09-23 MED ORDER — METOPROLOL SUCCINATE ER 25 MG PO TB24
25.0000 mg | ORAL_TABLET | Freq: Every day | ORAL | Status: DC
  Administered 2014-09-24 – 2014-09-26 (×3): 25 mg via ORAL
  Filled 2014-09-23 (×5): qty 1

## 2014-09-23 MED ORDER — VANCOMYCIN HCL 1000 MG IV SOLR: 15.0000 mg/kg | Freq: Once | INTRAVENOUS | Status: DC

## 2014-09-23 NOTE — ED Notes (Signed)
Pt assisted to BSC and back to bed 

## 2014-09-23 NOTE — ED Notes (Signed)
Pt in by ems with sob and chest, back and abd pain, states it started a couple of days ago, now with worsening cough.

## 2014-09-23 NOTE — Progress Notes (Addendum)
ANTICOAGULATION CONSULT NOTE - Initial Consult  Pharmacy Consult for Heparin --> Warfarin Indication: atrial fibrillation  Allergies  Allergen Reactions  . Nitroglycerin Other (See Comments)    CAUSED NUMBNESS ALL OVER  . Doxycycline Itching  . Flexeril [Cyclobenzaprine] Other (See Comments)    Sweating, Lightheaded   . Ibuprofen Other (See Comments)    Increase BP, dizziness, lightheaded  . Tramadol Itching  . Tylenol [Acetaminophen] Other (See Comments)    Pt has Hep C  . Vesicare [Solifenacin] Other (See Comments)    Makes my sweat turn yellow  . Hydralazine     Altered mental status, hallucinations, angression  . Amoxil [Amoxicillin] Nausea Only    Patient Measurements:   Heparin Dosing Weight: 90 kg  Vital Signs: Temp: 97.7 F (36.5 C) (10/18 2032) Temp Source: Oral (10/18 2032) BP: 134/93 mmHg (10/18 2230) Pulse Rate: 86 (10/18 2245)  Labs:  Recent Labs  09/23/14 2043  HGB 13.1  HCT 40.4  PLT 181  CREATININE 0.77  TROPONINI 0.53*    The CrCl is unknown because both a height and weight (above a minimum accepted value) are required for this calculation.   Medical History: Past Medical History  Diagnosis Date  . COPD (chronic obstructive pulmonary disease)     Home O2 3L  . Coronary atherosclerosis of native coronary artery     Cardiac catheterization 07/2012 - LAD 40%; small D2 with 60 to 70% ostial; OM1 30 to 40%; RCA 20%; PL1 40%   . Hypothyroidism   . Hepatitis C   . Pneumonia   . Cirrhosis   . Type 2 diabetes mellitus   . GERD (gastroesophageal reflux disease)   . Headache(784.0)   . Anxiety   . Atrial fibrillation and flutter   . Chronic pain   . PE (pulmonary embolism)   . Chronic anticoagulation   . History of medication noncompliance   . SSS (sick sinus syndrome)     Biotronik pacemaker  . Cardiomyopathy     LVEF 40-45%    Medications:  Scheduled:  . [START ON 09/24/2014] amLODipine  2.5 mg Oral Daily  . budesonide-formoterol   2 puff Inhalation BID  . [START ON 09/24/2014] furosemide  80 mg Oral Daily  . metFORMIN  500 mg Oral QHS  . [START ON 09/24/2014] metoprolol succinate  25 mg Oral Daily  . [START ON 09/24/2014] pantoprazole  40 mg Oral Daily  . [START ON 09/24/2014] potassium chloride  20 mEq Oral Daily  . QUEtiapine  25 mg Oral QHS    Assessment: Okay for Protocol, Hx PE / Afib / Non-compliance.  Baseline PT/INR pending.  Goal of Therapy:  INR 2-3 Heparin level 0.3-0.7 units/ml Monitor platelets by anticoagulation protocol: Yes   Plan:  Begin Heparin @ 1200 units/hr Heparin Level in 6-8 hours. Daily Labs Warfarin dosing in AM based upon baseline labs.  Lamonte Richer R 09/23/2014,11:04 PM  ID: Vancomycin 1500mg  IV x 1 tonight. F/U AM for complete w/u and finalized regimen when complete admission information available.  Mady Gemma, [facility] 09/23/2014 11:11 PM   Anticoagulation: Heparin (never initiated) order changed to Lovenox due to difficulty with IV access in this patient. Lovenox 1mg /kg (110mg ) sq q12h started on this pt. Will follow up in am as stated above by [facility] Pharmacist.

## 2014-09-23 NOTE — ED Provider Notes (Signed)
CSN: [id number]     Arrival date & time 09/23/14  2028 History   First MD Initiated Contact with Patient 09/23/14 2029     Chief Complaint  Patient presents with  . Shortness of Breath     (Consider location/radiation/quality/duration/timing/severity/associated sxs/prior Treatment) HPI... shortness of breath for 3 days with associated chest pain. Patient thinks she is in congestive heart failure.  She has tried her home nebulizer machine with minimal relief. She has not been taking her Coumadin lately. She also complains of urinary incontinence. No peripheral edema. No fever sweats or chills. Patient arrived by EMS. Symptoms are moderate  Past Medical History  Diagnosis Date  . COPD (chronic obstructive pulmonary disease)     Home O2 3L  . Coronary atherosclerosis of native coronary artery     Cardiac catheterization 07/2012 - LAD 40%; small D2 with 60 to 70% ostial; OM1 30 to 40%; RCA 20%; PL1 40%   . Hypothyroidism   . Hepatitis C   . Pneumonia   . Cirrhosis   . Type 2 diabetes mellitus   . GERD (gastroesophageal reflux disease)   . Headache(784.0)   . Anxiety   . Atrial fibrillation and flutter   . Chronic pain   . PE (pulmonary embolism)   . Chronic anticoagulation   . History of medication noncompliance   . SSS (sick sinus syndrome)     Biotronik pacemaker  . Cardiomyopathy     LVEF 40-45%   Past Surgical History  Procedure Laterality Date  . Cholecystectomy    . Pacemaker insertion  02/2012    Biotronik PPM   . Cardiac electrophysiology study and ablation    . Esophagogastroduodenoscopy  Jan 2010    Dr. Arlyce DiceKaplan: multiple gastric erosions, benign path  . Esophagogastroduodenoscopy N/A 01/10/2014    XLK:GMWNUUVORMR:Patulous EG junction. Query short segment Barrett's esophagus-status post pinch biopsy as described above. Hiatal hernia; otherwise negative EGD   Family History  Problem Relation Age of Onset  . Coronary artery disease Father [id number]  . Coronary artery disease Mother [id number]   . Coronary artery disease Brother   . Colon cancer Neg Hx    History  Substance Use Topics  . Smoking status: Current Every Day Smoker -- 0.50 packs/day for 30 years    Types: Cigarettes  . Smokeless tobacco: Current User    Types: Snuff  . Alcohol Use: No   OB History   Grav Para Term Preterm Abortions TAB SAB Ect Mult Living                 Review of Systems  All other systems reviewed and are negative.     Allergies  Nitroglycerin; Doxycycline; Flexeril; Ibuprofen; Tramadol; Tylenol; Vesicare; Hydralazine; and Amoxil  Home Medications   Prior to Admission medications   Medication Sig Start Date End Date Taking? Authorizing Provider  albuterol (PROVENTIL HFA;VENTOLIN HFA) 108 (90 BASE) MCG/ACT inhaler Inhale 2 puffs into the lungs every 6 (six) hours as needed for wheezing or shortness of breath.    Yes Historical Provider, MD  budesonide-formoterol (SYMBICORT) 80-4.5 MCG/ACT inhaler Inhale 2 puffs into the lungs 2 (two) times daily.   Yes Historical Provider, MD  metFORMIN (GLUCOPHAGE) 500 MG tablet Take 1 tablet (500 mg total) by mouth at bedtime. 08/24/14  Yes Lesle ChrisKaren M Black, NP  omeprazole (PRILOSEC) 40 MG capsule Take 1 capsule (40 mg total) by mouth daily. 09/04/14  Yes John L Molpus, MD  amLODipine (NORVASC) 2.5 MG tablet Take 1 tablet (  2.5 mg total) by mouth daily. 07/01/14   Standley Brooking, MD  furosemide (LASIX) 80 MG tablet Take 1 tablet (80 mg total) by mouth daily. 08/24/14   Gwenyth Bender, NP  hydrOXYzine (VISTARIL) 25 MG capsule Take 25 mg by mouth 3 (three) times daily as needed for anxiety or itching.    Historical Provider, MD  metoprolol succinate (TOPROL-XL) 25 MG 24 hr tablet Take 1 tablet (25 mg total) by mouth daily. 06/14/14   Gwenyth Bender, NP  pantoprazole (PROTONIX) 40 MG tablet Take 40 mg by mouth daily.    Historical Provider, MD  potassium chloride 20 MEQ TBCR Take 20 mEq by mouth daily. Take with furosemide (Lasix) 08/24/14   Elliot Cousin, MD   QUEtiapine (SEROQUEL) 25 MG tablet Take 1 tablet (25 mg total) by mouth at bedtime. 08/24/14   Gwenyth Bender, NP  warfarin (COUMADIN) 4 MG tablet Take 1 tablet (4 mg total) by mouth daily. 08/24/14   Gwenyth Bender, NP   BP 137/94  Pulse 86  Temp(Src) 97.7 F (36.5 C) (Oral)  Resp 23  SpO2 93%  LMP 02/08/2007 Physical Exam  Nursing note and vitals reviewed. Constitutional: She is oriented to person, place, and time.  Appears older than stated age  HENT:  Head: Normocephalic and atraumatic.  Eyes: Conjunctivae and EOM are normal. Pupils are equal, round, and reactive to light.  Neck: Normal range of motion. Neck supple.  Cardiovascular: Normal rate, regular rhythm and normal heart sounds.   Pulmonary/Chest: Effort normal.  Rales versus wheezing bilateral  Abdominal: Soft. Bowel sounds are normal.  Musculoskeletal: Normal range of motion.  Neurological: She is alert and oriented to person, place, and time.  Skin: Skin is warm and dry.  No peripheral edema.  Psychiatric: She has a normal mood and affect. Her behavior is normal.    ED Course  Procedures (including critical care time) Labs Review Labs Reviewed  COMPREHENSIVE METABOLIC PANEL  CBC WITH DIFFERENTIAL  TROPONIN I    Imaging Review No results found.   EKG Interpretation   Date/Time:  Sunday September 23 2014 20:33:24 EDT Ventricular Rate:  83 PR Interval:  165 QRS Duration: 175 QT Interval:  464 QTC Calculation: 545 R Axis:   -87 Text Interpretation:  Sinus rhythm Ventricular preexcitation(WPW)  Confirmed by Adriana Simas  MD, Betsi Crespi ([id number]) on 09/23/2014 8:57:22 PM      MDM   Final diagnoses:  None    Diagnostic studies pending. Discussed with Dr. Enis Slipper, MD 09/23/14 2103

## 2014-09-23 NOTE — ED Notes (Signed)
CRITICAL VALUE ALERT  Critical value received:Troponin 0.53  Date of notification:  09/23/2014  Time of notification:  2146  Critical value read back:Yes.    Nurse who received alert:  Neldon Mc RN  MD notified (1st page):  Dr. Jodi Mourning  Time of first [phone]

## 2014-09-23 NOTE — ED Notes (Signed)
Pt back from x-ray.

## 2014-09-23 NOTE — H&P (Signed)
[name] History and Physical  Kim [name] DOB: [birth year] DOA: 09/23/2014  Referring physician: EDP PCP: No PCP Per Patient   Chief Complaint: SOB, CP   HPI: Kim [name] is a 49 y.o. female known to our service with frequent admissions and ER evaluations for non-cardiac chest pain, and shortness of breath; with known history of atrial fibrillation, PE on chronic anticoagulation, SSS, s/p Pacemaker, non-obstructive CAD with recent cath in 2013, low risk stress myoview in July of 2015, chronically elevated troponins, COPD, anxiety and depression, medical non-compliance and concern for drug seeking behavior.   She presents to the ED at [facility] today with 3 day history of SOB and associated CP.  She reports non-productive cough.  Occasional fevers and chills (subjective, objective hasnt had any yet).  Home nebs provide minimal relief.  Has not been taking coumadin recently.  Review of Systems: Systems reviewed.  As above, otherwise negative  Past Medical History  Diagnosis Date  . COPD (chronic obstructive pulmonary disease)     Home O2 3L  . Coronary atherosclerosis of native coronary artery     Cardiac catheterization 07/2012 - LAD 40%; small D2 with 60 to 70% ostial; OM1 30 to 40%; RCA 20%; PL1 40%   . Hypothyroidism   . Hepatitis C   . Pneumonia   . Cirrhosis   . Type 2 diabetes mellitus   . GERD (gastroesophageal reflux disease)   . Headache(784.0)   . Anxiety   . Atrial fibrillation and flutter   . Chronic pain   . PE (pulmonary embolism)   . Chronic anticoagulation   . History of medication noncompliance   . SSS (sick sinus syndrome)     Biotronik pacemaker  . Cardiomyopathy     LVEF 40-45%   Past Surgical History  Procedure Laterality Date  . Cholecystectomy    . Pacemaker insertion  02/2012    Biotronik PPM   . Cardiac electrophysiology study and ablation    . Esophagogastroduodenoscopy  Jan 2010    Dr. Arlyce Dice: multiple gastric erosions,  benign path  . Esophagogastroduodenoscopy N/A 01/10/2014    EPP:IRJJOACZ EG junction. Query short segment Barrett's esophagus-status post pinch biopsy as described above. Hiatal hernia; otherwise negative EGD   Social History:  reports that she has been smoking Cigarettes.  She has a 15 pack-year smoking history. Her smokeless tobacco use includes Snuff. She reports that she does not drink alcohol or use illicit drugs.  Allergies  Allergen Reactions  . Nitroglycerin Other (See Comments)    CAUSED NUMBNESS ALL OVER  . Doxycycline Itching  . Flexeril [Cyclobenzaprine] Other (See Comments)    Sweating, Lightheaded   . Ibuprofen Other (See Comments)    Increase BP, dizziness, lightheaded  . Tramadol Itching  . Tylenol [Acetaminophen] Other (See Comments)    Pt has Hep C  . Vesicare [Solifenacin] Other (See Comments)    Makes my sweat turn yellow  . Hydralazine     Altered mental status, hallucinations, angression  . Amoxil [Amoxicillin] Nausea Only    Family History  Problem Relation Age of Onset  . Coronary artery disease Father 21  . Coronary artery disease Mother 76  . Coronary artery disease Brother   . Colon cancer Neg Hx      Prior to Admission medications   Medication Sig Start Date End Date Taking? Authorizing Provider  albuterol (PROVENTIL HFA;VENTOLIN HFA) 108 (90 BASE) MCG/ACT inhaler Inhale 2 puffs into the lungs every 6 (six) hours as  needed for wheezing or shortness of breath.    Yes Historical Provider, MD  budesonide-formoterol (SYMBICORT) 80-4.5 MCG/ACT inhaler Inhale 2 puffs into the lungs 2 (two) times daily.   Yes Historical Provider, MD  metFORMIN (GLUCOPHAGE) 500 MG tablet Take 1 tablet (500 mg total) by mouth at bedtime. 08/24/14  Yes Lesle ChrisKaren M Black, NP  omeprazole (PRILOSEC) 40 MG capsule Take 1 capsule (40 mg total) by mouth daily. 09/04/14  Yes John L Molpus, MD  amLODipine (NORVASC) 2.5 MG tablet Take 1 tablet (2.5 mg total) by mouth daily. 07/01/14   Standley Brookinganiel P  Goodrich, MD  furosemide (LASIX) 80 MG tablet Take 1 tablet (80 mg total) by mouth daily. 08/24/14   Gwenyth BenderKaren M Black, NP  hydrOXYzine (VISTARIL) 25 MG capsule Take 25 mg by mouth 3 (three) times daily as needed for anxiety or itching.    Historical Provider, MD  metoprolol succinate (TOPROL-XL) 25 MG 24 hr tablet Take 1 tablet (25 mg total) by mouth daily. 06/14/14   Gwenyth BenderKaren M Black, NP  pantoprazole (PROTONIX) 40 MG tablet Take 40 mg by mouth daily.    Historical Provider, MD  potassium chloride 20 MEQ TBCR Take 20 mEq by mouth daily. Take with furosemide (Lasix) 08/24/14   Elliot Cousinenise Fisher, MD  QUEtiapine (SEROQUEL) 25 MG tablet Take 1 tablet (25 mg total) by mouth at bedtime. 08/24/14   Gwenyth BenderKaren M Black, NP  warfarin (COUMADIN) 4 MG tablet Take 1 tablet (4 mg total) by mouth daily. 08/24/14   Gwenyth BenderKaren M Black, NP   Physical Exam: Filed Vitals:   09/23/14 2245  BP:   Pulse: 86  Temp:   Resp: 33    BP 134/93  Pulse 86  Temp(Src) 97.7 F (36.5 C) (Oral)  Resp 33  SpO2 98%  LMP 02/08/2007  General Appearance:    Alert, oriented, no distress, appears stated age  Head:    Normocephalic, atraumatic  Eyes:    PERRL, EOMI, sclera non-icteric        Nose:   Nares without drainage or epistaxis. Mucosa, turbinates normal  Throat:   Moist mucous membranes. Oropharynx without erythema or exudate.  Neck:   Supple. No carotid bruits.  No thyromegaly.  No lymphadenopathy.   Back:     No CVA tenderness, no spinal tenderness  Lungs:     Clear to auscultation bilaterally, without wheezes, rhonchi or rales  Chest wall:    No tenderness to palpitation  Heart:    Regular rate and rhythm without murmurs, gallops, rubs  Abdomen:     Soft, non-tender, nondistended, normal bowel sounds, no organomegaly  Genitalia:    deferred  Rectal:    deferred  Extremities:   No clubbing, cyanosis or edema.  Pulses:   2+ and symmetric all extremities  Skin:   Skin color, texture, turgor normal, no rashes or lesions  Lymph nodes:    Cervical, supraclavicular, and axillary nodes normal  Neurologic:   CNII-XII intact. Normal strength, sensation and reflexes      throughout    Labs on Admission:  Basic Metabolic Panel:  Recent Labs Lab 09/23/14 2043  NA 142  K 3.5*  CL 101  CO2 35*  GLUCOSE 117*  BUN 7  CREATININE 0.77  CALCIUM 8.8   Liver Function Tests:  Recent Labs Lab 09/23/14 2043  AST 36  ALT 25  ALKPHOS 73  BILITOT 0.9  PROT 7.0  ALBUMIN 3.1*   No results found for this basename: LIPASE, AMYLASE,  in  the last 168 hours No results found for this basename: AMMONIA,  in the last 168 hours CBC:  Recent Labs Lab 09/23/14 2043  WBC 6.2  NEUTROABS 3.6  HGB 13.1  HCT 40.4  MCV 103.1*  PLT 181   Cardiac Enzymes:  Recent Labs Lab 09/23/14 2043  TROPONINI 0.53*    BNP (last 3 results)  Recent Labs  07/21/14 2100 07/23/14 1509 08/20/14 1540  PROBNP 2436.0* 1639.0* 1380.0*   CBG: No results found for this basename: GLUCAP,  in the last 168 hours  Radiological Exams on Admission: Dg Chest 2 View  09/23/2014   CLINICAL DATA:  Nausea, shortness of breath, weakness, cough, and congestion for 2 days.  EXAM: CHEST  2 VIEW  COMPARISON:  08/20/2014  FINDINGS: Interval development of airspace infiltration in the right lung base posteriorly consistent with right lower lobe pneumonia. Small right pleural effusion. Left lung is clear. Heart size and pulmonary vascularity are normal. Cardiac pacemaker unchanged.  IMPRESSION: New right lower lobe infiltration and effusion consistent with pneumonia.   Electronically Signed   By: Burman Nieves M.D.   On: 09/23/2014 21:56    EKG: Independently reviewed.  Assessment/Plan Principal Problem:   HCAP (healthcare-associated pneumonia) Active Problems:   Chest pain   Elevated troponin I level   1. HCAP - PNA finding on CXR 1. Cefepime and vanc, last admit was just last month so this qualifies as HCAP. 2. Cultures pending 2. Chronic CHF -  continue home meds 3. Chest pain and elevated troponin - patient with chronically elevated troponin, will trend her troponins to see where she goes with this but unless something changes, it seems unlikely that cardiology will plan any further testing (see their most recent note on 08/21/14 in chart as well as numerous previous notes). 1. Low risk stress test July 2015, non-obstructive "mild" disease on cath in 2013. 2. Plan to minimize narcotics during admit at this time due to ongoing concern of drug seeking behavior documented by other providers previously in chart: (Cards consult on 08/21/14, Cards consult on 06/29/14, Dr. Rene Kocher progress notes on 7/24-26/15, Cards consult on 06/13/14, among numerous others). 4. H/O PE - not currently taking coumadin at home, compliance with blood thinners has been an ongoing issue, will have pharm bridge heparin to coumadin.   Code Status: Full Code  Family Communication: No family in room Disposition Plan: Admit to inpatient   Time spent: 70 min  GARDNER, JARED M. [name] Pager [phone]  If 7AM-7PM, please contact the day team taking care of the patient [email or web address] Password [email or web address] 09/23/2014, 10:58 PM

## 2014-09-24 DIAGNOSIS — B182 Chronic viral hepatitis C: Secondary | ICD-10-CM

## 2014-09-24 DIAGNOSIS — I482 Chronic atrial fibrillation: Secondary | ICD-10-CM

## 2014-09-24 DIAGNOSIS — E876 Hypokalemia: Secondary | ICD-10-CM

## 2014-09-24 LAB — PROTIME-INR
INR: 1.18 (ref 0.00–1.49)
Prothrombin Time: 15.1 seconds (ref 11.6–15.2)

## 2014-09-24 LAB — BASIC METABOLIC PANEL
Anion gap: 8 (ref 5–15)
BUN: 9 mg/dL (ref 6–23)
CALCIUM: 8.3 mg/dL — AB (ref 8.4–10.5)
CO2: 35 meq/L — AB (ref 19–32)
CREATININE: 0.78 mg/dL (ref 0.50–1.10)
Chloride: 100 mEq/L (ref 96–112)
GFR calc Af Amer: >90 mL/min (ref >90)
GLUCOSE: 118 mg/dL — AB (ref 70–99)
Potassium: 3.4 mEq/L — ABNORMAL LOW (ref 3.7–5.3)
SODIUM: 143 meq/L (ref 137–147)

## 2014-09-24 LAB — CBC
HEMATOCRIT: 37.4 % (ref 36.0–46.0)
Hemoglobin: 12.2 g/dL (ref 12.0–15.0)
MCH: 33.2 pg (ref 26.0–34.0)
MCHC: 32.6 g/dL (ref 30.0–36.0)
MCV: 101.9 fL — AB (ref 78.0–100.0)
Platelets: 187 10*3/uL (ref 150–400)
RBC: 3.67 MIL/uL — AB (ref 3.87–5.11)
RDW: 15.1 % (ref 11.5–15.5)
WBC: 6.6 10*3/uL (ref 4.0–10.5)

## 2014-09-24 LAB — GLUCOSE, CAPILLARY
GLUCOSE-CAPILLARY: 84 mg/dL (ref 70–99)
Glucose-Capillary: 103 mg/dL — ABNORMAL HIGH (ref 70–99)
Glucose-Capillary: 105 mg/dL — ABNORMAL HIGH (ref 70–99)
Glucose-Capillary: 98 mg/dL (ref 70–99)

## 2014-09-24 LAB — TROPONIN I
TROPONIN I: 0.49 ng/mL — AB (ref <0.30)
Troponin I: 0.49 ng/mL (ref <0.30)

## 2014-09-24 LAB — HIV ANTIBODY (ROUTINE TESTING W REFLEX): HIV 1&2 Ab, 4th Generation: NONREACTIVE

## 2014-09-24 LAB — MRSA PCR SCREENING: MRSA BY PCR: POSITIVE — AB

## 2014-09-24 MED ORDER — ENOXAPARIN SODIUM 120 MG/0.8ML ~~LOC~~ SOLN
110.0000 mg | Freq: Two times a day (BID) | SUBCUTANEOUS | Status: DC
Start: 2014-09-24 — End: 2014-09-24
  Administered 2014-09-24: 110 mg via SUBCUTANEOUS
  Filled 2014-09-24 (×2): qty 0.8

## 2014-09-24 MED ORDER — ASPIRIN 325 MG PO TABS
325.0000 mg | ORAL_TABLET | Freq: Every day | ORAL | Status: DC
  Administered 2014-09-24 – 2014-09-26 (×3): 325 mg via ORAL
  Filled 2014-09-24 (×3): qty 1

## 2014-09-24 MED ORDER — INSULIN ASPART 100 UNIT/ML ~~LOC~~ SOLN
0.0000 [IU] | Freq: Three times a day (TID) | SUBCUTANEOUS | Status: DC
  Administered 2014-09-26: 1 [IU] via SUBCUTANEOUS

## 2014-09-24 MED ORDER — ZOLPIDEM TARTRATE 5 MG PO TABS
5.0000 mg | ORAL_TABLET | Freq: Every evening | ORAL | Status: DC | PRN
  Administered 2014-09-24: 5 mg via ORAL
  Filled 2014-09-24: qty 1

## 2014-09-24 MED ORDER — NAPROXEN 250 MG PO TABS
375.0000 mg | ORAL_TABLET | Freq: Two times a day (BID) | ORAL | Status: DC | PRN
  Administered 2014-09-24 – 2014-09-25 (×3): 375 mg via ORAL
  Filled 2014-09-24 (×3): qty 2

## 2014-09-24 MED ORDER — VANCOMYCIN HCL IN DEXTROSE 1-5 GM/200ML-% IV SOLN
1000.0000 mg | Freq: Two times a day (BID) | INTRAVENOUS | Status: DC
  Administered 2014-09-24 – 2014-09-25 (×2): 1000 mg via INTRAVENOUS
  Filled 2014-09-24 (×5): qty 200

## 2014-09-24 MED ORDER — MUPIROCIN 2 % EX OINT
1.0000 "application " | TOPICAL_OINTMENT | Freq: Two times a day (BID) | CUTANEOUS | Status: DC
  Administered 2014-09-24 – 2014-09-26 (×4): 1 via NASAL
  Filled 2014-09-24 (×2): qty 22

## 2014-09-24 MED ORDER — ALBUTEROL SULFATE (2.5 MG/3ML) 0.083% IN NEBU: 3.0000 mL | INHALATION_SOLUTION | RESPIRATORY_TRACT | Status: DC | PRN

## 2014-09-24 MED ORDER — INSULIN ASPART 100 UNIT/ML ~~LOC~~ SOLN: 0.0000 [IU] | Freq: Every day | SUBCUTANEOUS | Status: DC

## 2014-09-24 MED ORDER — DEXTROSE 5 % IV SOLN
INTRAVENOUS | Status: AC
  Filled 2014-09-24: qty 1

## 2014-09-24 MED ORDER — IPRATROPIUM-ALBUTEROL 0.5-2.5 (3) MG/3ML IN SOLN
3.0000 mL | Freq: Four times a day (QID) | RESPIRATORY_TRACT | Status: DC
  Administered 2014-09-24 – 2014-09-25 (×5): 3 mL via RESPIRATORY_TRACT
  Filled 2014-09-24 (×5): qty 3

## 2014-09-24 MED ORDER — ENOXAPARIN SODIUM 30 MG/0.3ML ~~LOC~~ SOLN
30.0000 mg | SUBCUTANEOUS | Status: DC
  Administered 2014-09-24: 30 mg via SUBCUTANEOUS
  Filled 2014-09-24: qty 0.3

## 2014-09-24 MED ORDER — POTASSIUM CHLORIDE CRYS ER 20 MEQ PO TBCR
30.0000 meq | EXTENDED_RELEASE_TABLET | Freq: Two times a day (BID) | ORAL | Status: AC
  Administered 2014-09-24 (×2): 30 meq via ORAL
  Filled 2014-09-24 (×4): qty 1

## 2014-09-24 MED ORDER — CHLORHEXIDINE GLUCONATE CLOTH 2 % EX PADS
6.0000 | MEDICATED_PAD | Freq: Every day | CUTANEOUS | Status: DC
  Administered 2014-09-25 – 2014-09-26 (×2): 6 via TOPICAL

## 2014-09-24 MED ORDER — ENOXAPARIN SODIUM 120 MG/0.8ML ~~LOC~~ SOLN
SUBCUTANEOUS | Status: AC
  Filled 2014-09-24: qty 0.8

## 2014-09-24 NOTE — Progress Notes (Signed)
ANTIBIOTIC CONSULT NOTE - FOLLOW UP  Pharmacy Consult for Vancomcyin Indication: pneumonia  Allergies  Allergen Reactions  . Nitroglycerin Other (See Comments)    CAUSED NUMBNESS ALL OVER  . Doxycycline Itching  . Flexeril [Cyclobenzaprine] Other (See Comments)    Sweating, Lightheaded   . Ibuprofen Other (See Comments)    Increase BP, dizziness, lightheaded  . Tramadol Itching  . Tylenol [Acetaminophen] Other (See Comments)    Pt has Hep C  . Vesicare [Solifenacin] Other (See Comments)    Makes my sweat turn yellow  . Hydralazine     Altered mental status, hallucinations, angression  . Amoxil [Amoxicillin] Nausea Only    Patient Measurements: Height: 5\' 9"  (175.3 cm) Weight: 234 lb (106.142 kg) IBW/kg (Calculated) : 66.2  Vital Signs: Temp: 98.6 F (37 C) (10/19 0536) Temp Source: Oral (10/19 0536) BP: 116/61 mmHg (10/19 0536) Pulse Rate: 74 (10/19 0536) Intake/Output from previous day:   Intake/Output from this shift:    Labs:  Recent Labs  09/23/14 2043 09/24/14 0511  WBC 6.2 6.6  HGB 13.1 12.2  PLT 181 187  CREATININE 0.77 0.78   Estimated Creatinine Clearance: 110.4 ml/min (by C-G formula based on Cr of 0.78). No results found for this basename: VANCOTROUGH, Leodis BinetVANCOPEAK, VANCORANDOM, GENTTROUGH, GENTPEAK, GENTRANDOM, TOBRATROUGH, TOBRAPEAK, TOBRARND, AMIKACINPEAK, AMIKACINTROU, AMIKACIN,  in the last 72 hours   Microbiology: Recent Results (from the past 720 hour(s))  CULTURE, BLOOD (ROUTINE X 2)     Status: None   Collection Time    09/23/14 10:39 PM      Result Value Ref Range Status   Specimen Description Blood BLOOD RIGHT ARM   Final   Special Requests BOTTLES DRAWN AEROBIC AND ANAEROBIC 8CC   Final   Culture PENDING   Incomplete   Report Status PENDING   Incomplete  CULTURE, BLOOD (ROUTINE X 2)     Status: None   Collection Time    09/23/14 10:44 PM      Result Value Ref Range Status   Specimen Description Blood RIGHT ANTECUBITAL   Final   Special Requests BOTTLES DRAWN AEROBIC AND ANAEROBIC 9CC   Final   Culture PENDING   Incomplete   Report Status PENDING   Incomplete    Anti-infectives   Start     Dose/Rate Route Frequency Ordered Stop   09/24/14 0600  ceFEPIme (MAXIPIME) 1 g in dextrose 5 % 50 mL IVPB     1 g 100 mL/hr over 30 Minutes Intravenous 3 times per day 09/23/14 2258 10/02/14 0559   09/23/14 2315  vancomycin (VANCOCIN) 1,500 mg in sodium chloride 0.9 % 500 mL IVPB     1,500 mg 250 mL/hr over 120 Minutes Intravenous  Once 09/23/14 2302 09/24/14 0244   09/23/14 2230  vancomycin (VANCOCIN) 15 mg/kg in sodium chloride 0.9 % 100 mL IVPB  Status:  Discontinued     15 mg/kg 100 mL/hr over 60 Minutes Intravenous  Once 09/23/14 2221 09/23/14 2300   09/23/14 2230  ceFEPIme (MAXIPIME) 1 g in dextrose 5 % 50 mL IVPB     1 g 100 mL/hr over 30 Minutes Intravenous  Once 09/23/14 2221 09/23/14 2326     Assessment: Okay for Protocol, HCAP - PNA finding on CXR.  Obesity/Normalized CrCl dosing protocol will be initiate with an estimated normalized CrCl = 98 ml/min.  Patient received initial doses last evening.  Goal of Therapy:  Vancomycin trough level 15-20 mcg/ml  Plan:  Vancomycin 1gm IV every 12 hours.  Measure antibiotic drug levels at steady state Follow up culture results  Kim [name] 09/24/2014,9:10 AM

## 2014-09-24 NOTE — Care Management Utilization Note (Signed)
UR completed 

## 2014-09-24 NOTE — Progress Notes (Signed)
Earlier this morning the pt displayed some mild anxiety, to which emotional support was given. Pt has been more calm throughout the day and is currently resting comfortably in her bed, taking an occasional nap and watching tv.

## 2014-09-24 NOTE — Progress Notes (Signed)
The patient was seen and examined. Her chart and vital signs/laboratory studies were reviewed. She was discussed with nurse practitioner, Ms. black. Agree with her assessment and plan.  This is a 48 year old woman with a history of atrial fibrillation, chronic noncardiac chest pain, nonobstructive coronary artery disease, chronically elevated troponin I, oxygen dependent COPD and frequent hospitalizations secondary to noncompliance. She presented to the hospital on 09/23/14 with shortness of breath. Her chest x-ray revealed an infiltrate consistent with healthcare acquired pneumonia. We will continue supportive treatment antibiotics as outlined by Ms. Black. Because of her known history and documentation of noncompliance, it was felt that ongoing anticoagulation treatment would be inappropriate in this patient. Therefore, aspirin was started and Coumadin discontinued for treatment of atrial fibrillation and history of PE.

## 2014-09-24 NOTE — Progress Notes (Signed)
PATIENT IS A NEW ADMIT THAT ARRIVED ON UNIT 09/23/14. PATIENT IS ALERT AND ORIENTED X 4. LUNGS SOUNDS SLIGHTLY WHEEZING. ABD SOFT NONTENDER. ACTIVE RANGE OF MOTION IN ALL UPPER AND LOWER EXTS. PATIENT AMBULATED TO BATHROOM WITHOUT DIFFICULTY. SHE IS CONTINENT OF BOWEL AND BLADDER. SHE COMPLAIN OF BACK PAIN AND UNABLE TO SLEEP. DR. Julian Reil NOTIFIED AND ORDERED AMBIEM 5 MG PRN AND NAXPROXEN 500 MG FOR PAIN. PATIENT STATED SHE TAKES THAT AT HOME.

## 2014-09-24 NOTE — Progress Notes (Signed)
[name] PROGRESS NOTE  Kim DavenportSandra F Bostwick XLK:[id number] DOB: November 24, 1966 DOA: 09/23/2014 PCP: No PCP Per Patient  Assessment/Plan: 1. HCAP - PNA finding on CXR  1. Cefepime and vanc day #2. Last admit was just last month so this qualifies as HCAP. She remains afebrile and non-toxic appearing.  2.  Blood cultures pending 2. Chronic CHF -  Diastolic dysfunction. Echo 6/15 yields normal LV function with EF 55% and grade 2 diastolic dysfunction. Currently appears compensated. She received 40mg  IV lasix in ED. Will continue home dose. Monitor intake and output. Daily weights. Chest pain and elevated troponin - patient with chronically elevated troponin. Troponin levels at baseline range.  History of thorough cardiac work up. Per cardiology note dated 08/21/14.  1. Low risk stress test July 2015, non-obstructive "mild" disease on cath in 2013. 2. Plan to minimize narcotics during admit at this time due to ongoing concern of drug seeking behavior documented by other providers previously in chart: (Cards consult on 08/21/14, Cards consult on 06/29/14, Dr. Rene KocherGoodrich's progress notes on 7/24-26/15, Cards consult on 06/13/14, among numerous others). 3. No complaints chest pain this am. Chart review indicates troponin level at baseline range. Considered Imdur but chart indicates allergy/reaction.  3. H/O PE - not currently taking coumadin at home, compliance with blood thinners has been an ongoing issue. Chart review indicates patient repeatedly non-compliant with coumadin. Last admission other agents explored in effort to increased compliance but patient refused. INR on admission 0.49. Given ongoing hx of non-compliance will discontinue coumadin and full dose lovenox. Will provide aspirin 325mg .  4. Afib: rate controlled. See above discussion regarding anti-coagulation.  5. COPD: appears stable at baseline. Serum CO2 slightly above her baseline. Will provide nebs and continue home symbicort. Currently oxygen  saturation level >90% on room air. Reports wearing oxygen only at night at home.  6. Chronic respiratory failure. Related to #2 and #6. Wears oxygen at home at night. Appears stable at baseline. Will mobilize and monitor oxygen saturation level 7. Non-compliance with medical therapy and follow up. Chart review indicates patient has been counseled many times by many provider regarding importance of compliance with meds and follow up as well as possible consequences of non-compliance. Case management arranged [facility] and patient reports not going as "they have not called me yet". 8. Depression/anxiety. Appears stable at baseline.  9. Macrocytosis: MCV trending down today. Chart review indicates this somewhat chronic. Monitor 10. Hypokalemia: mild. Likely related to IV lasix given in ED. Will replete and recheck in am. 11. Hyperglycemia: hx of same likely related to frequent steroid use. HgA1c 5.8 recently. Continue home metformin. monitor     Code Status: full Family Communication: none present Disposition Plan: home hopefully 24-48 hours   Consultants:  none  Procedures:  none  Antibiotics:  Vancomycin 10/18/5>>>  maxipime 09/23/14>>  HPI/Subjective: Lying in bed somewhat drowsy. Resisting awakening for interview/exam. Denies CP.   Objective: Filed Vitals:   09/24/14 0536  BP: 116/61  Pulse: 74  Temp: 98.6 F (37 C)  Resp: 21   No intake or output data in the 24 hours ending 09/24/14 0953 Filed Weights   09/24/14 0019  Weight: 106.142 kg (234 lb)    Exam:   General:  Obese appears very comfortable  Cardiovascular: S1 and S2. No MGR No LE edema  Respiratory: normal effort BS somewhat diminished and course throughout. Faint end expiratory wheeze. No crackles  Abdomen: obese soft +BS non-tender to palpation. No  guarding   Musculoskeletal: joints without swelling/erythema     Data Reviewed: Basic Metabolic Panel:  Recent  Labs Lab 09/23/14 2043 09/24/14 0511  NA 142 143  K 3.5* 3.4*  CL 101 100  CO2 35* 35*  GLUCOSE 117* 118*  BUN 7 9  CREATININE 0.77 0.78  CALCIUM 8.8 8.3*   Liver Function Tests:  Recent Labs Lab 09/23/14 2043  AST 36  ALT 25  ALKPHOS 73  BILITOT 0.9  PROT 7.0  ALBUMIN 3.1*   No results found for this basename: LIPASE, AMYLASE,  in the last 168 hours No results found for this basename: AMMONIA,  in the last 168 hours CBC:  Recent Labs Lab 09/23/14 2043 09/24/14 0511  WBC 6.2 6.6  NEUTROABS 3.6  --   HGB 13.1 12.2  HCT 40.4 37.4  MCV 103.1* 101.9*  PLT 181 187   Cardiac Enzymes:  Recent Labs Lab 09/23/14 2043 09/23/14 2259 09/24/14 0511  TROPONINI 0.53* 0.51* 0.49*   BNP (last 3 results)  Recent Labs  07/21/14 2100 07/23/14 1509 08/20/14 1540  PROBNP 2436.0* 1639.0* 1380.0*   CBG:  Recent Labs Lab 09/24/14 0912  GLUCAP 84    Recent Results (from the past 240 hour(s))  CULTURE, BLOOD (ROUTINE X 2)     Status: None   Collection Time    09/23/14 10:39 PM      Result Value Ref Range Status   Specimen Description Blood BLOOD RIGHT ARM   Final   Special Requests BOTTLES DRAWN AEROBIC AND ANAEROBIC 8CC   Final   Culture PENDING   Incomplete   Report Status PENDING   Incomplete  CULTURE, BLOOD (ROUTINE X 2)     Status: None   Collection Time    09/23/14 10:44 PM      Result Value Ref Range Status   Specimen Description Blood RIGHT ANTECUBITAL   Final   Special Requests BOTTLES DRAWN AEROBIC AND ANAEROBIC 9CC   Final   Culture PENDING   Incomplete   Report Status PENDING   Incomplete     Studies: Dg Chest 2 View  09/23/2014   CLINICAL DATA:  Nausea, shortness of breath, weakness, cough, and congestion for 2 days.  EXAM: CHEST  2 VIEW  COMPARISON:  08/20/2014  FINDINGS: Interval development of airspace infiltration in the right lung base posteriorly consistent with right lower lobe pneumonia. Small right pleural effusion. Left lung is  clear. Heart size and pulmonary vascularity are normal. Cardiac pacemaker unchanged.  IMPRESSION: New right lower lobe infiltration and effusion consistent with pneumonia.   Electronically Signed   By: Burman Nieves M.D.   On: 09/23/2014 21:56    Scheduled Meds: . amLODipine  2.5 mg Oral Daily  . aspirin  325 mg Oral Daily  . budesonide-formoterol  2 puff Inhalation BID  . ceFEPime (MAXIPIME) IV  1 g Intravenous 3 times per day  . enoxaparin (LOVENOX) injection  30 mg Subcutaneous Q24H  . furosemide  80 mg Oral Daily  . insulin aspart  0-5 Units Subcutaneous QHS  . insulin aspart  0-9 Units Subcutaneous TID WC  . ipratropium-albuterol  3 mL Nebulization Q6H  . metFORMIN  500 mg Oral QHS  . metoprolol succinate  25 mg Oral Daily  . pantoprazole  80 mg Oral Daily  . potassium chloride  20 mEq Oral Daily  . potassium chloride  30 mEq Oral BID  . QUEtiapine  25 mg Oral QHS  . vancomycin  1,000 mg Intravenous  Q12H   Continuous Infusions:   Principal Problem:   HCAP (healthcare-associated pneumonia) Active Problems:   COPD (chronic obstructive pulmonary disease)   Chest pain   Atrial fibrillation   Coronary atherosclerosis of native coronary artery   Depression with anxiety   Elevated troponin I level   Chronic respiratory failure   Obesity    Time spent: 40 minutes    [facility]  [name] Pager [phone]. If 7PM-7AM, please contact night-coverage at [email or web address], password [facility] 09/24/2014, 9:53 AM  LOS: 1 day

## 2014-09-25 DIAGNOSIS — N179 Acute kidney failure, unspecified: Secondary | ICD-10-CM

## 2014-09-25 LAB — MAGNESIUM: Magnesium: 1.4 mg/dL — ABNORMAL LOW (ref 1.5–2.5)

## 2014-09-25 LAB — GLUCOSE, CAPILLARY
GLUCOSE-CAPILLARY: 111 mg/dL — AB (ref 70–99)
Glucose-Capillary: 117 mg/dL — ABNORMAL HIGH (ref 70–99)
Glucose-Capillary: 103 mg/dL — ABNORMAL HIGH (ref 70–99)
Glucose-Capillary: 120 mg/dL — ABNORMAL HIGH (ref 70–99)
Glucose-Capillary: 164 mg/dL — ABNORMAL HIGH (ref 70–99)

## 2014-09-25 LAB — BASIC METABOLIC PANEL
Anion gap: 11 (ref 5–15)
BUN: 19 mg/dL (ref 6–23)
CALCIUM: 8.5 mg/dL (ref 8.4–10.5)
CO2: 34 mEq/L — ABNORMAL HIGH (ref 19–32)
Chloride: 95 mEq/L — ABNORMAL LOW (ref 96–112)
Creatinine, Ser: 1.32 mg/dL — ABNORMAL HIGH (ref 0.50–1.10)
GFR calc Af Amer: 54 mL/min — ABNORMAL LOW (ref >90)
GFR calc non Af Amer: 46 mL/min — ABNORMAL LOW (ref >90)
GLUCOSE: 105 mg/dL — AB (ref 70–99)
POTASSIUM: 3.8 meq/L (ref 3.7–5.3)
Sodium: 140 mEq/L (ref 137–147)

## 2014-09-25 LAB — CBC
HCT: 38.4 % (ref 36.0–46.0)
HEMOGLOBIN: 12.6 g/dL (ref 12.0–15.0)
MCH: 32.6 pg (ref 26.0–34.0)
MCHC: 32.8 g/dL (ref 30.0–36.0)
MCV: 99.5 fL (ref 78.0–100.0)
Platelets: 185 10*3/uL (ref 150–400)
RBC: 3.86 MIL/uL — ABNORMAL LOW (ref 3.87–5.11)
RDW: 14.9 % (ref 11.5–15.5)
WBC: 5.8 10*3/uL (ref 4.0–10.5)

## 2014-09-25 LAB — VANCOMYCIN, TROUGH: Vancomycin Tr: 22 ug/mL — ABNORMAL HIGH (ref 10.0–20.0)

## 2014-09-25 LAB — PROTIME-INR
INR: 1.08 (ref 0.00–1.49)
Prothrombin Time: 14.1 seconds (ref 11.6–15.2)

## 2014-09-25 MED ORDER — PREDNISONE 20 MG PO TABS
20.0000 mg | ORAL_TABLET | Freq: Two times a day (BID) | ORAL | Status: DC
  Administered 2014-09-25 – 2014-09-26 (×2): 20 mg via ORAL
  Filled 2014-09-25 (×2): qty 1

## 2014-09-25 MED ORDER — VANCOMYCIN HCL 10 G IV SOLR
1500.0000 mg | INTRAVENOUS | Status: DC
  Filled 2014-09-25: qty 1500

## 2014-09-25 MED ORDER — IPRATROPIUM-ALBUTEROL 0.5-2.5 (3) MG/3ML IN SOLN
3.0000 mL | RESPIRATORY_TRACT | Status: DC
  Administered 2014-09-25 – 2014-09-26 (×6): 3 mL via RESPIRATORY_TRACT
  Filled 2014-09-25 (×6): qty 3

## 2014-09-25 MED ORDER — ENOXAPARIN SODIUM 60 MG/0.6ML ~~LOC~~ SOLN
50.0000 mg | SUBCUTANEOUS | Status: DC
  Administered 2014-09-25 – 2014-09-26 (×2): 50 mg via SUBCUTANEOUS
  Filled 2014-09-25 (×2): qty 0.6

## 2014-09-25 MED ORDER — MAGNESIUM OXIDE 400 (241.3 MG) MG PO TABS
400.0000 mg | ORAL_TABLET | Freq: Every day | ORAL | Status: DC
  Administered 2014-09-25 – 2014-09-26 (×2): 400 mg via ORAL
  Filled 2014-09-25 (×2): qty 1

## 2014-09-25 NOTE — Care Management Note (Addendum)
    Page 1 of 2   09/27/2014     9:38:19 AM CARE MANAGEMENT NOTE 09/27/2014  Patient:  Kim [name], Kim [name]   Account Number:  [id number]  Date Initiated:  09/25/2014  Documentation initiated by:  Anibal Henderson  Subjective/Objective Assessment:   Admitted with PNA, Hx COPD, PE. She is on coumadin, but is noncompliant with this. Pt is from home with son, and his girlfriend, and plans to return home     Action/Plan:   Pt has had 2-3 appointments with the [facility] for her PCP, but has a reason each time why she did not go. She has been given the phone # for [facility] and [facility], so has transportation   Anticipated DC Date:  09/27/2014   Anticipated DC Plan:  HOME/SELF CARE      DC Planning Services  CM consult  Follow-up appt scheduled      Choice offered to / List presented to:             Status of service:  Completed, signed off Medicare Important Message given?   (If response is "NO", the following Medicare IM given date fields will be blank) Date Medicare IM given:   Medicare IM given by:   Date Additional Medicare IM given:   Additional Medicare IM given by:    Discharge Disposition:  HOME/SELF CARE  Per UR Regulation:  Reviewed for med. necessity/level of care/duration of stay  If discussed at Long Length of Stay Meetings, dates discussed:    Comments:  09/26/14 1620 Arlyss Queen, RN BSN CM Pt discharged home today. Pt aware that medication assistance is not available due to pt having Medicaid. Pt given contact information for [facility] and [facility] (as was done on previous admission). Pt very upset that help cannot be given with medication and hospital would not provide transportation. Pt with long history of noncompliance. Pt discharged home today.  09/25/14 1645 Bryauna Byrum RN/CM  late entry-09/27/14 0930  on 09/25/14 Pt was given the phone numbers for [facility] and [facility], and the 211 handout with all [facility] information. Appointment made with the [facility], [facility], for  10/05/14 at 2:30pm, and she agrees she will go. Pt was told no further appointments will be made, because this is the 3rd or 4th appointment we have made for her and we have given her the number for free transportation each time and she has not gone to the appt. Also discussed vhy she does not take coumadin, but pt just talks in a circle about this, never really giving an answer. Explained she really needs to take whatever she is D/C on this time, to prevent clotting, for her own health. pt still talks in circles about what she will do. Very difficult to talk with.

## 2014-09-25 NOTE — Progress Notes (Signed)
The patient was seen and examined. Her chart and vital signs/laboratory studies were reviewed. She was discussed with nurse practitioner, Ms. black. Agree with her assessment and plan.  This is a 47-year-old woman with a history of atrial fibrillation, chronic noncardiac chest pain, nonobstructive coronary artery disease, chronically elevated troponin I, oxygen dependent COPD and frequent hospitalizations secondary to noncompliance. She presented to the hospital on 09/23/14 with shortness of breath. Her chest x-ray revealed an infiltrate consistent with healthcare acquired pneumonia. We will continue supportive treatment antibiotics as outlined by Ms. Black. Because of her known history and documentation of noncompliance, it was felt that ongoing anticoagulation treatment would be inappropriate in this patient. Therefore, aspirin was started and Coumadin discontinued for treatment of atrial fibrillation and history of PE. 

## 2014-09-25 NOTE — Progress Notes (Signed)
The patient was seen and examined. She was discussed with nurse practitioner, Ms. Vedia Coffer. Agree with her assessment and plan. In addition, we'll add low dosing of prednisone for increased  bronchospasms. The patient has a history of chronic wheezing. Overall, she appears to be improving clinically. As stated yesterday, I do not believe she is a candidate for anticoagulation secondary to her long history of noncompliance. We'll continue antiplatelet therapy for history of PE and atrial fibrillation. Will supplement magnesium for borderline hypomagnesemia. We'll watch her renal function closely as there has been an increase in her creatinine overnight. As stated by Ms. Black, we'll hold home dose of Lasix and monitor. Consider gentle IV fluids tomorrow if her renal function worsens.

## 2014-09-25 NOTE — Plan of Care (Signed)
Problem: Phase II Progression Outcomes Goal: Progress activity as tolerated unless otherwise ordered Outcome: Completed/Met Date Met:  09/25/14 Patient ambulated in hallway today times one assist.

## 2014-09-25 NOTE — Progress Notes (Signed)
Patient placed on contact precautions on 10/19 at 1015 by Estevan Oaks, RN as noted by Clinical research associate.

## 2014-09-25 NOTE — Progress Notes (Signed)
ANTIBIOTIC CONSULT NOTE - FOLLOW UP  Pharmacy Consult for Vancomcyin Indication: pneumonia  Allergies  Allergen Reactions  . Nitroglycerin Other (See Comments)    CAUSED NUMBNESS ALL OVER  . Doxycycline Itching  . Flexeril [Cyclobenzaprine] Other (See Comments)    Sweating, Lightheaded   . Ibuprofen Other (See Comments)    Increase BP, dizziness, lightheaded  . Tramadol Itching  . Tylenol [Acetaminophen] Other (See Comments)    Pt has Hep C  . Vesicare [Solifenacin] Other (See Comments)    Makes my sweat turn yellow  . Hydralazine     Altered mental status, hallucinations, angression  . Amoxil [Amoxicillin] Nausea Only    Patient Measurements: Height: 5\' 9"  (175.3 cm) Weight: 235 lb 11.2 oz (106.913 kg) IBW/kg (Calculated) : 66.2  Vital Signs: Temp: 97.7 F (36.5 C) (10/20 0621) Temp Source: Oral (10/20 0621) BP: 120/69 mmHg (10/20 0621) Pulse Rate: 71 (10/20 0621) Intake/Output from previous day: 10/19 0701 - 10/20 0700 In: 1280 [P.O.:1080; IV Piggyback:200] Out: 1200 [Urine:1200] Intake/Output from this shift: Total I/O In: 480 [P.O.:480] Out: -   Labs:  Recent Labs  09/23/14 2043 09/24/14 0511 09/25/14 0459  WBC 6.2 6.6 5.8  HGB 13.1 12.2 12.6  PLT 181 187 185  CREATININE 0.77 0.78 1.32*   Estimated Creatinine Clearance: 67.1 ml/min (by C-G formula based on Cr of 1.32).  Recent Labs  09/25/14 1023  VANCOTROUGH 22.0*     Microbiology: Recent Results (from the past 720 hour(s))  CULTURE, BLOOD (ROUTINE X 2)     Status: None   Collection Time    09/23/14 10:39 PM      Result Value Ref Range Status   Specimen Description BLOOD RIGHT ARM   Final   Special Requests BOTTLES DRAWN AEROBIC AND ANAEROBIC 8CC   Final   Culture NO GROWTH 2 DAYS   Final   Report Status PENDING   Incomplete  CULTURE, BLOOD (ROUTINE X 2)     Status: None   Collection Time    09/23/14 10:44 PM      Result Value Ref Range Status   Specimen Description BLOOD RIGHT  ANTECUBITAL   Final   Special Requests BOTTLES DRAWN AEROBIC AND ANAEROBIC 9CC   Final   Culture NO GROWTH 2 DAYS   Final   Report Status PENDING   Incomplete  MRSA PCR SCREENING     Status: Abnormal   Collection Time    09/24/14  1:15 PM      Result Value Ref Range Status   MRSA by PCR POSITIVE (*) NEGATIVE Final   Comment:            The GeneXpert MRSA Assay (FDA     approved for NASAL specimens     only), is one component of a     comprehensive MRSA colonization     surveillance program. It is not     intended to diagnose MRSA     infection nor to guide or     monitor treatment for     MRSA infections.     RESULT CALLED TO, READ BACK BY AND VERIFIED WITH:     SEIGLAR,J. AT 1748 ON 09/24/2014 BY BAUGHAM,M.    Anti-infectives   Start     Dose/Rate Route Frequency Ordered Stop   09/24/14 1100  vancomycin (VANCOCIN) IVPB 1000 mg/200 mL premix     1,000 mg 200 mL/hr over 60 Minutes Intravenous Every 12 hours 09/24/14 0916     09/24/14 0600  ceFEPIme (MAXIPIME) 1 g in dextrose 5 % 50 mL IVPB     1 g 100 mL/hr over 30 Minutes Intravenous 3 times per day 09/23/14 2258 10/02/14 0559   09/23/14 2315  vancomycin (VANCOCIN) 1,500 mg in sodium chloride 0.9 % 500 mL IVPB     1,500 mg 250 mL/hr over 120 Minutes Intravenous  Once 09/23/14 2302 09/24/14 0244   09/23/14 2230  vancomycin (VANCOCIN) 15 mg/kg in sodium chloride 0.9 % 100 mL IVPB  Status:  Discontinued     15 mg/kg 100 mL/hr over 60 Minutes Intravenous  Once 09/23/14 2221 09/23/14 2300   09/23/14 2230  ceFEPIme (MAXIPIME) 1 g in dextrose 5 % 50 mL IVPB     1 g 100 mL/hr over 30 Minutes Intravenous  Once 09/23/14 2221 09/23/14 2326     Assessment: Okay for Protocol, HCAP - PNA.  Worsening renal function.  Obesity/Normalized CrCl dosing protocol will be initiate with an estimated normalized CrCl = 58 ml/min.  Trough above goal.  Micro pending.  Goal of Therapy:  Vancomycin trough level 15-20 mcg/ml  Plan:  Reduce  Vancomycin 1500mg  IV every 24 hours. Measure antibiotic drug levels at steady state Follow up culture results  Mady GemmaHayes, Melaney Tellefsen R 09/25/2014,12:34 PM

## 2014-09-25 NOTE — Progress Notes (Signed)
[name] PROGRESS NOTE  Kim DavenportSandra F [name] ONG:[id number]:[id number] DOB: [birth year] DOA: 09/23/2014 PCP: No PCP Per Patient  Assessment/Plan: 1. HCAP - PNA finding on CXR  1. Cefepime and vanc day #3. She remains afebrile and non-toxic appearing.  2. Blood cultures with no growth to date. 2. Chronic CHF - Diastolic dysfunction. Echo 6/15 yields normal LV function with EF 55% and grade 2 diastolic dysfunction. Currently appears compensated. She received 40mg  IV lasix in ED.  Volume status +80cc. Weight stable at 106kg.  Monitor intake and output. Daily weights. Will hold po lasix for 1 day due to acute kidney injury.  Chest pain and elevated troponin - patient with chronically elevated troponin. Troponin levels at baseline range. History of thorough cardiac work up. Per cardiology note dated 08/21/14.  1. Low risk stress test July 2015, non-obstructive "mild" disease on cath in 2013. 2. Plan to minimize narcotics during admit at this time due to ongoing concern of drug seeking behavior documented by other providers previously in chart: (Cards consult on 08/21/14, Cards consult on 06/29/14, Dr. Rene KocherGoodrich's progress notes on 7/24-26/15, Cards consult on 06/13/14, among numerous others). 3. No complaints chest pain this am. Chart review indicates troponin level at baseline range. Considered Imdur but chart indicates allergy/reaction.  2. H/O PE - not currently taking coumadin at home, compliance with blood thinners has been an ongoing issue. Chart review indicates patient repeatedly non-compliant with coumadin. Last admission other agents explored in effort to increased compliance but patient refused. INR on admission 0.49. Given ongoing hx of non-compliance will discontinue coumadin and full dose lovenox. Will provide aspirin 325mg .  3. Afib: rate controlled. See above discussion regarding anti-coagulation.  4. COPD: slight increased wheezing this am.  Serum CO2 at her baseline. Will continue nebs but increase  frequency and continue home symbicort. Currently oxygen saturation level >90% on room air. Reports wearing oxygen only at night at home.  5. Chronic respiratory failure. Related to #2 and #6. Wears oxygen at home at night. remains stable at baseline. Will mobilize and monitor oxygen saturation level 6. Non-compliance with medical therapy and follow up. Chart review indicates patient has been counseled many times by many provider regarding importance of compliance with meds and follow up as well as possible consequences of non-compliance. Case management arranged for [facility] and patient reports not going as "they have not called me yet". 7. Depression/anxiety. Appears stable at baseline.  8. Macrocytosis: MCV within limits of normal today. Chart review indicates this somewhat chronic. Monitor 9. Hypokalemia: resolved 10. Hyperglycemia: CBG range 98-117. hx of same likely related to frequent steroid use. HgA1c 5.8 recently. Continue home metformin. Monitor 11. Acute kidney injury: mild. Likely related to IV lasix given in ED in setting of vancomycin use. Will hold home lasix for 1 dose. Encourage po fluids judiciously given chronic HR. Pharmacy to dose vancomycin. Consider transitioning antibiotics. Recheck in am.   Code Status: full Family Communication: none present Disposition Plan: home hopefully tomorrow   Consultants:  none  Procedures:  none  Antibiotics: Vancomycin 10/18/5>>>  maxipime 09/23/14>>   HPI/Subjective: Sitting up in bed eating. Reports breathing "much better" today. Complains non-productive coughing  Objective: Filed Vitals:   09/25/14 0621  BP: 120/69  Pulse: 71  Temp: 97.7 F (36.5 C)  Resp: 20    Intake/Output Summary (Last 24 hours) at 09/25/14 0939 Last data filed at 09/25/14 0852  Gross per 24 hour  Intake   1760 ml  Output  1200 ml  Net    560 ml   Filed Weights   09/24/14 0019 09/24/14 0916 09/25/14 0621  Weight:  106.142 kg (234 lb) 106.142 kg (234 lb) 106.913 kg (235 lb 11.2 oz)    Exam:   General:  Obese appears comfortable   Cardiovascular: S1 and S2. No m/g/r no LE edema.   Respiratory: normal effort with conversation. Improved air flow with diffuse rhonchi and faint expiratory wheeze no crackles  Abdomen: obese soft +Bs non-tender to palpation  Musculoskeletal: no clubbing no cyanosis   Data Reviewed: Basic Metabolic Panel:  Recent Labs Lab 09/23/14 2043 09/24/14 0511 09/25/14 0459 09/25/14 0837  NA 142 143 140  --   K 3.5* 3.4* 3.8  --   CL 101 100 95*  --   CO2 35* 35* 34*  --   GLUCOSE 117* 118* 105*  --   BUN 7 9 19   --   CREATININE 0.77 0.78 1.32*  --   CALCIUM 8.8 8.3* 8.5  --   MG  --   --   --  1.4*   Liver Function Tests:  Recent Labs Lab 09/23/14 2043  AST 36  ALT 25  ALKPHOS 73  BILITOT 0.9  PROT 7.0  ALBUMIN 3.1*   No results found for this basename: LIPASE, AMYLASE,  in the last 168 hours No results found for this basename: AMMONIA,  in the last 168 hours CBC:  Recent Labs Lab 09/23/14 2043 09/24/14 0511 09/25/14 0459  WBC 6.2 6.6 5.8  NEUTROABS 3.6  --   --   HGB 13.1 12.2 12.6  HCT 40.4 37.4 38.4  MCV 103.1* 101.9* 99.5  PLT 181 187 185   Cardiac Enzymes:  Recent Labs Lab 09/23/14 2043 09/23/14 2259 09/24/14 0511 09/24/14 1049  TROPONINI 0.53* 0.51* 0.49* 0.49*   BNP (last 3 results)  Recent Labs  07/21/14 2100 07/23/14 1509 08/20/14 1540  PROBNP 2436.0* 1639.0* 1380.0*   CBG:  Recent Labs Lab 09/24/14 0912 09/24/14 1153 09/24/14 1616 09/24/14 2118 09/25/14 0713  GLUCAP 84 103* 105* 98 117*    Recent Results (from the past 240 hour(s))  CULTURE, BLOOD (ROUTINE X 2)     Status: None   Collection Time    09/23/14 10:39 PM      Result Value Ref Range Status   Specimen Description Blood BLOOD RIGHT ARM   Final   Special Requests BOTTLES DRAWN AEROBIC AND ANAEROBIC 8CC   Final   Culture NO GROWTH 1 DAY   Final    Report Status PENDING   Incomplete  CULTURE, BLOOD (ROUTINE X 2)     Status: None   Collection Time    09/23/14 10:44 PM      Result Value Ref Range Status   Specimen Description Blood RIGHT ANTECUBITAL   Final   Special Requests BOTTLES DRAWN AEROBIC AND ANAEROBIC 9CC   Final   Culture NO GROWTH 1 DAY   Final   Report Status PENDING   Incomplete  MRSA PCR SCREENING     Status: Abnormal   Collection Time    09/24/14  1:15 PM      Result Value Ref Range Status   MRSA by PCR POSITIVE (*) NEGATIVE Final   Comment:            The GeneXpert MRSA Assay (FDA     approved for NASAL specimens     only), is one component of a     comprehensive MRSA colonization  surveillance program. It is not     intended to diagnose MRSA     infection nor to guide or     monitor treatment for     MRSA infections.     RESULT CALLED TO, READ BACK BY AND VERIFIED WITH:     SEIGLAR,J. AT 1748 ON 09/24/2014 BY BAUGHAM,M.     Studies: Dg Chest 2 View  09/23/2014   CLINICAL DATA:  Nausea, shortness of breath, weakness, cough, and congestion for 2 days.  EXAM: CHEST  2 VIEW  COMPARISON:  08/20/2014  FINDINGS: Interval development of airspace infiltration in the right lung base posteriorly consistent with right lower lobe pneumonia. Small right pleural effusion. Left lung is clear. Heart size and pulmonary vascularity are normal. Cardiac pacemaker unchanged.  IMPRESSION: New right lower lobe infiltration and effusion consistent with pneumonia.   Electronically Signed   By: Burman Nieves M.D.   On: 09/23/2014 21:56    Scheduled Meds: . amLODipine  2.5 mg Oral Daily  . aspirin  325 mg Oral Daily  . budesonide-formoterol  2 puff Inhalation BID  . ceFEPime (MAXIPIME) IV  1 g Intravenous 3 times per day  . Chlorhexidine Gluconate Cloth  6 each Topical Q0600  . enoxaparin (LOVENOX) injection  50 mg Subcutaneous Q24H  . insulin aspart  0-5 Units Subcutaneous QHS  . insulin aspart  0-9 Units Subcutaneous  TID WC  . ipratropium-albuterol  3 mL Nebulization Q4H  . metFORMIN  500 mg Oral QHS  . metoprolol succinate  25 mg Oral Daily  . mupirocin ointment  1 application Nasal BID  . pantoprazole  80 mg Oral Daily  . potassium chloride  20 mEq Oral Daily  . QUEtiapine  25 mg Oral QHS  . vancomycin  1,000 mg Intravenous Q12H   Continuous Infusions:   Principal Problem:   HCAP (healthcare-associated pneumonia) Active Problems:   COPD (chronic obstructive pulmonary disease)   Chest pain   Atrial fibrillation   Coronary atherosclerosis of native coronary artery   Depression with anxiety   Elevated troponin I level   Chronic respiratory failure   Obesity   Acute kidney injury    Time spent: 35 mintues    [facility] M  [name] Pager [phone]. If 7PM-7AM, please contact night-coverage at [email or web address], password [facility] 09/25/2014, 9:39 AM  LOS: 2 days

## 2014-09-26 LAB — GLUCOSE, CAPILLARY
Glucose-Capillary: 142 mg/dL — ABNORMAL HIGH (ref 70–99)
Glucose-Capillary: 114 mg/dL — ABNORMAL HIGH (ref 70–99)

## 2014-09-26 LAB — BASIC METABOLIC PANEL
ANION GAP: 11 (ref 5–15)
BUN: 21 mg/dL (ref 6–23)
CO2: 30 meq/L (ref 19–32)
CREATININE: 1.24 mg/dL — AB (ref 0.50–1.10)
Calcium: 8.7 mg/dL (ref 8.4–10.5)
Chloride: 98 mEq/L (ref 96–112)
GFR calc Af Amer: 58 mL/min — ABNORMAL LOW (ref >90)
GFR calc non Af Amer: 50 mL/min — ABNORMAL LOW (ref >90)
Glucose, Bld: 156 mg/dL — ABNORMAL HIGH (ref 70–99)
Potassium: 4.8 mEq/L (ref 3.7–5.3)
Sodium: 139 mEq/L (ref 137–147)

## 2014-09-26 LAB — CBC
HEMATOCRIT: 39.1 % (ref 36.0–46.0)
HEMOGLOBIN: 12.9 g/dL (ref 12.0–15.0)
MCH: 32.8 pg (ref 26.0–34.0)
MCHC: 33 g/dL (ref 30.0–36.0)
MCV: 99.5 fL (ref 78.0–100.0)
PLATELETS: 188 10*3/uL (ref 150–400)
RBC: 3.93 MIL/uL (ref 3.87–5.11)
RDW: 14.9 % (ref 11.5–15.5)
WBC: 8 10*3/uL (ref 4.0–10.5)

## 2014-09-26 MED ORDER — ASPIRIN 325 MG PO TABS: 325.0000 mg | ORAL_TABLET | Freq: Every day | ORAL | Status: AC

## 2014-09-26 MED ORDER — NAPROXEN 375 MG PO TABS: 375.0000 mg | ORAL_TABLET | Freq: Two times a day (BID) | ORAL | Status: AC | PRN

## 2014-09-26 MED ORDER — MAGNESIUM OXIDE 400 (241.3 MG) MG PO TABS: 400.0000 mg | ORAL_TABLET | Freq: Every day | ORAL | Status: AC

## 2014-09-26 MED ORDER — PREDNISONE 20 MG PO TABS: 20.0000 mg | ORAL_TABLET | Freq: Two times a day (BID) | ORAL | Status: DC

## 2014-09-26 MED ORDER — LEVOFLOXACIN 250 MG PO TABS
250.0000 mg | ORAL_TABLET | Freq: Every day | ORAL | Status: DC
  Administered 2014-09-26: 250 mg via ORAL
  Filled 2014-09-26: qty 1

## 2014-09-26 MED ORDER — LEVOFLOXACIN 250 MG PO TABS: 250.0000 mg | ORAL_TABLET | Freq: Every day | ORAL | Status: DC

## 2014-09-26 MED ORDER — FUROSEMIDE 80 MG PO TABS
80.0000 mg | ORAL_TABLET | Freq: Every day | ORAL | Status: DC
  Administered 2014-09-26: 80 mg via ORAL
  Filled 2014-09-26: qty 1

## 2014-09-26 MED ORDER — IPRATROPIUM-ALBUTEROL 0.5-2.5 (3) MG/3ML IN SOLN: 3.0000 mL | Freq: Two times a day (BID) | RESPIRATORY_TRACT | Status: DC

## 2014-09-26 NOTE — Progress Notes (Signed)
Kim [name] discharged home per MD order.  Discharge instructions reviewed and discussed with the patient, all questions and concerns answered. Copy of instructions and scripts given to patient.    Medication List    STOP taking these medications       warfarin 4 MG tablet  Commonly known as:  COUMADIN      TAKE these medications       albuterol 108 (90 BASE) MCG/ACT inhaler  Commonly known as:  PROVENTIL HFA;VENTOLIN HFA  Inhale 2 puffs into the lungs every 6 (six) hours as needed for wheezing or shortness of breath.     amLODipine 2.5 MG tablet  Commonly known as:  NORVASC  Take 1 tablet (2.5 mg total) by mouth daily.     aspirin 325 MG tablet  Take 1 tablet (325 mg total) by mouth daily.     budesonide-formoterol 80-4.5 MCG/ACT inhaler  Commonly known as:  SYMBICORT  Inhale 2 puffs into the lungs 2 (two) times daily.     furosemide 80 MG tablet  Commonly known as:  LASIX  Take 1 tablet (80 mg total) by mouth daily.     ipratropium-albuterol 0.5-2.5 (3) MG/3ML Soln  Commonly known as:  DUONEB  Take 3 mLs by nebulization 3 (three) times daily.     levofloxacin 250 MG tablet  Commonly known as:  LEVAQUIN  Take 1 tablet (250 mg total) by mouth daily.     magnesium oxide 400 (241.3 MG) MG tablet  Commonly known as:  MAG-OX  Take 1 tablet (400 mg total) by mouth daily.     metFORMIN 500 MG tablet  Commonly known as:  GLUCOPHAGE  Take 1 tablet (500 mg total) by mouth at bedtime.     metoprolol succinate 25 MG 24 hr tablet  Commonly known as:  TOPROL-XL  Take 1 tablet (25 mg total) by mouth daily.     naproxen 375 MG tablet  Commonly known as:  NAPROSYN  Take 1 tablet (375 mg total) by mouth 2 (two) times daily as needed for mild pain or moderate pain.     omeprazole 40 MG capsule  Commonly known as:  PRILOSEC  Take 1 capsule (40 mg total) by mouth daily.     Potassium Chloride ER 20 MEQ Tbcr  Take 20 mEq by mouth daily. Take with furosemide (Lasix)     predniSONE 20 MG tablet  Commonly known as:  DELTASONE  Take 1 tablet (20 mg total) by mouth 2 (two) times daily with a meal.     QUEtiapine 25 MG tablet  Commonly known as:  SEROQUEL  Take 1 tablet (25 mg total) by mouth at bedtime.        Patients skin is clean, dry and intact, no evidence of skin break down. IV site discontinued and catheter remains intact. Site without signs and symptoms of complications. Dressing and pressure applied.  Patient escorted to car by [place], NT in a wheelchair,  no distress noted upon discharge.  [name] 09/26/2014 4:14 PM

## 2014-09-26 NOTE — Discharge Summary (Signed)
Physician Discharge Summary  Kim DavenportSandra F Akram ZHY:[id number] DOB: 1966-06-13 DOA: 09/23/2014  PCP: No PCP Per Patient  Admit date: 09/23/2014 Discharge date: 09/26/2014  Time spent: 40 minutes  Recommendations for Outpatient Follow-up:  1. [facility] 1 week for evaluation of HCAP. Also recommend BMET to track kidney function.    Discharge Diagnoses:  Principal Problem:   HCAP (healthcare-associated pneumonia) Active Problems:   COPD (chronic obstructive pulmonary disease)   Chest pain   Atrial fibrillation   Coronary atherosclerosis of native coronary artery   Depression with anxiety   Elevated troponin I level   Chronic respiratory failure   Obesity   Acute kidney injury   Discharge Condition: stable  Diet recommendation: heart healthy carb modified  Filed Weights   09/24/14 0916 09/25/14 0621 09/26/14 0500  Weight: 106.142 kg (234 lb) 106.913 kg (235 lb 11.2 oz) 106.641 kg (235 lb 1.6 oz)    History of present illness:  Kim [name] is a 48 y.o. female known to [facility] service with frequent admissions and ER evaluations for non-cardiac chest pain, and shortness of breath; with known history of atrial fibrillation, PE on chronic anticoagulation, SSS, s/p Pacemaker, non-obstructive CAD with recent cath in 2013, low risk stress myoview in July of 2015, chronically elevated troponins, COPD, anxiety and depression, medical non-compliance and concern for drug seeking behavior.  She presented to the ED at [facility] on 09/23/14 with 3 day history of SOB and associated CP. She reported non-productive cough. Occasional fevers and chills (subjective, objective hasnt had any yet). Home nebs provided minimal relief. Had not been taking coumadin recently.   Hospital Course:  1. HCAP - PNA finding on CXR  1. Cefepime and vanc provided for 4 days. She remained afebrile and non-toxic appearing. Blood cultures with no growth to date. Strep pneumoniae antigen and  legionella antigen in process.  Oxygen saturation levels >93% on room air with ambulation. Will discharge with Levaquin to complete 7 day course 2. Chronic CHF - Diastolic dysfunction. Echo 6/15 yields normal LV function with EF 55% and grade 2 diastolic dysfunction. Remained compensated. She received 40mg  IV lasix in ED. Volume status +1L at discharge. Weight stable at 106kg.   Chest pain and elevated troponin - patient with chronically elevated troponin. Troponin levels remained at baseline range. History of thorough cardiac work up. Per cardiology note dated 08/21/14 as follows.  1. Low risk stress test July 2015, non-obstructive "mild" disease on cath in 2013. 2. Plan to minimize narcotics during admit at this time due to ongoing concern of drug seeking behavior documented by other providers previously in chart: (Cards consult on 08/21/14, Cards consult on 06/29/14, Dr. Rene KocherGoodrich's progress notes on 7/24-26/15, Cards consult on 06/13/14, among numerous others). 3. No complaints chest pain this am. Chart review indicates troponin level at baseline range. Considered Imdur but chart indicates allergy/reaction.  2. H/O PE - not currently taking coumadin at home, compliance with blood thinners has been an ongoing issue. Chart review indicates patient repeatedly non-compliant with coumadin. Last admission other agents explored in effort to increased compliance but patient refused. INR on admission 0.49. Given ongoing hx of non-compliance will discontinue coumadin and full dose lovenox. Will provide aspirin 325mg . Recommend close OP follow up with PCP  3. Afib: rate controlled. See above discussion regarding anti-coagulation.  4. COPD:  Serum CO2 at her baseline. Some mild wheezes. Provided with low dose prednisone. Will continue for 5 more days at discharge. She  has home nebs and inhalers. At discharge  oxygen saturation level >93% with ambulation on room air. Reported wearing oxygen only at night at home.   5. Chronic respiratory failure. Related to #2 and #6. Wears oxygen at home at night. See above 6. Non-compliance with medical therapy and follow up. Chart review indicates patient has been counseled many times by many provider regarding importance of compliance with meds and follow up as well as possible consequences of non-compliance. Case management arranged for follow up [facility]. 7. Depression/anxiety. Remained stable at baseline.  8. Macrocytosis: MCV within limits of normal at discharge. Chart review indicates this somewhat chronic.  9. Hypokalemia: resolved 10. Hyperglycemia: CBG range 114-142. hx of same likely related to frequent steroid use. HgA1c 5.8 recently. Continue home metformin.  11. Acute kidney injury: mild. Likely related to IV lasix given in ED in setting of vancomycin use. Lasix held for 1 dose. Creatinine trending to baseline at discharge. Recommend BMET in 1-2 weeks for tracking of creatinine.     Procedures:  none  Consultations:  none  Discharge Exam: Filed Vitals:   09/26/14 0524  BP: 119/76  Pulse: 72  Temp: 98 F (36.7 C)  Resp: 18    General: obese appears very comfortable Cardiovascular: S1 and S2. i hear no murmur, gallup or rub. No LE edema Respiratory: normal effort. BS with improved air flow. Some coarseness throughout, minimal wheeze. No crackles.  Discharge Instructions You were cared for by a hospitalist during your hospital stay. If you have any questions about your discharge medications or the care you received while you were in the hospital after you are discharged, you can call the unit and asked to speak with the hospitalist on call if the hospitalist that took care of you is not available. Once you are discharged, your primary care physician will handle any further medical issues. Please note that NO REFILLS for any discharge medications will be authorized once you are discharged, as it is imperative that you return to your  primary care physician (or establish a relationship with a primary care physician if you do not have one) for your aftercare needs so that they can reassess your need for medications and monitor your lab values.   Current Discharge Medication List    START taking these medications   Details  aspirin 325 MG tablet Take 1 tablet (325 mg total) by mouth daily.    levofloxacin (LEVAQUIN) 250 MG tablet Take 1 tablet (250 mg total) by mouth daily. Qty: 3 tablet, Refills: 0    magnesium oxide (MAG-OX) 400 (241.3 MG) MG tablet Take 1 tablet (400 mg total) by mouth daily. Qty: 30 tablet, Refills: 0    naproxen (NAPROSYN) 375 MG tablet Take 1 tablet (375 mg total) by mouth 2 (two) times daily as needed for mild pain or moderate pain. Qty: 10 tablet, Refills: 0      CONTINUE these medications which have NOT CHANGED   Details  albuterol (PROVENTIL HFA;VENTOLIN HFA) 108 (90 BASE) MCG/ACT inhaler Inhale 2 puffs into the lungs every 6 (six) hours as needed for wheezing or shortness of breath.     amLODipine (NORVASC) 2.5 MG tablet Take 1 tablet (2.5 mg total) by mouth daily. Qty: 30 tablet, Refills: 0    budesonide-formoterol (SYMBICORT) 80-4.5 MCG/ACT inhaler Inhale 2 puffs into the lungs 2 (two) times daily.    furosemide (LASIX) 80 MG tablet Take 1 tablet (80 mg total) by mouth daily. Qty: 30 tablet,  Refills: 2    ipratropium-albuterol (DUONEB) 0.5-2.5 (3) MG/3ML SOLN Take 3 mLs by nebulization 3 (three) times daily.    metFORMIN (GLUCOPHAGE) 500 MG tablet Take 1 tablet (500 mg total) by mouth at bedtime. Qty: 30 tablet, Refills: 1    metoprolol succinate (TOPROL-XL) 25 MG 24 hr tablet Take 1 tablet (25 mg total) by mouth daily. Qty: 25 tablet, Refills: 0    omeprazole (PRILOSEC) 40 MG capsule Take 1 capsule (40 mg total) by mouth daily. Qty: 30 capsule, Refills: 0    potassium chloride 20 MEQ TBCR Take 20 mEq by mouth daily. Take with furosemide (Lasix) Qty: 30 tablet, Refills: 2     QUEtiapine (SEROQUEL) 25 MG tablet Take 1 tablet (25 mg total) by mouth at bedtime. Qty: 30 tablet, Refills: 1      STOP taking these medications     warfarin (COUMADIN) 4 MG tablet      hydrOXYzine (VISTARIL) 25 MG capsule        Allergies  Allergen Reactions  . Nitroglycerin Other (See Comments)    CAUSED NUMBNESS ALL OVER  . Doxycycline Itching  . Flexeril [Cyclobenzaprine] Other (See Comments)    Sweating, Lightheaded   . Ibuprofen Other (See Comments)    Increase BP, dizziness, lightheaded  . Tramadol Itching  . Tylenol [Acetaminophen] Other (See Comments)    Pt has Hep C  . Vesicare [Solifenacin] Other (See Comments)    Makes my sweat turn yellow  . Hydralazine     Altered mental status, hallucinations, angression  . Amoxil [Amoxicillin] Nausea Only   Follow-up Information   Follow up with [facility]. Schedule an appointment as soon as possible for a visit in 1 week. (follow up to respiratory status)    Contact information:   [address] [place] [address] [phone]        The results of significant diagnostics from this hospitalization (including imaging, microbiology, ancillary and laboratory) are listed below for reference.    Significant Diagnostic Studies: Dg Chest 2 View  09/23/2014   CLINICAL DATA:  Nausea, shortness of breath, weakness, cough, and congestion for 2 days.  EXAM: CHEST  2 VIEW  COMPARISON:  08/20/2014  FINDINGS: Interval development of airspace infiltration in the right lung base posteriorly consistent with right lower lobe pneumonia. Small right pleural effusion. Left lung is clear. Heart size and pulmonary vascularity are normal. Cardiac pacemaker unchanged.  IMPRESSION: New right lower lobe infiltration and effusion consistent with pneumonia.   Electronically Signed   By: Burman Nieves M.D.   On: 09/23/2014 21:56    Microbiology: Recent Results (from the past 240 hour(s))  CULTURE, BLOOD (ROUTINE X 2)      Status: None   Collection Time    09/23/14 10:39 PM      Result Value Ref Range Status   Specimen Description BLOOD RIGHT ARM   Final   Special Requests BOTTLES DRAWN AEROBIC AND ANAEROBIC 8CC   Final   Culture NO GROWTH 3 DAYS   Final   Report Status PENDING   Incomplete  CULTURE, BLOOD (ROUTINE X 2)     Status: None   Collection Time    09/23/14 10:44 PM      Result Value Ref Range Status   Specimen Description BLOOD RIGHT ANTECUBITAL   Final   Special Requests BOTTLES DRAWN AEROBIC AND ANAEROBIC 9CC   Final   Culture NO GROWTH 3 DAYS   Final   Report Status PENDING  Incomplete  MRSA PCR SCREENING     Status: Abnormal   Collection Time    09/24/14  1:15 PM      Result Value Ref Range Status   MRSA by PCR POSITIVE (*) NEGATIVE Final   Comment:            The GeneXpert MRSA Assay (FDA     approved for NASAL specimens     only), is one component of a     comprehensive MRSA colonization     surveillance program. It is not     intended to diagnose MRSA     infection nor to guide or     monitor treatment for     MRSA infections.     RESULT CALLED TO, READ BACK BY AND VERIFIED WITH:     SEIGLAR,J. AT 1748 ON 09/24/2014 BY BAUGHAM,M.     Labs: Basic Metabolic Panel:  Recent Labs Lab 09/23/14 2043 09/24/14 0511 09/25/14 0459 09/25/14 0837 09/26/14 0525  NA 142 143 140  --  139  K 3.5* 3.4* 3.8  --  4.8  CL 101 100 95*  --  98  CO2 35* 35* 34*  --  30  GLUCOSE 117* 118* 105*  --  156*  BUN 7 9 19   --  21  CREATININE 0.77 0.78 1.32*  --  1.24*  CALCIUM 8.8 8.3* 8.5  --  8.7  MG  --   --   --  1.4*  --    Liver Function Tests:  Recent Labs Lab 09/23/14 2043  AST 36  ALT 25  ALKPHOS 73  BILITOT 0.9  PROT 7.0  ALBUMIN 3.1*   No results found for this basename: LIPASE, AMYLASE,  in the last 168 hours No results found for this basename: AMMONIA,  in the last 168 hours CBC:  Recent Labs Lab 09/23/14 2043 09/24/14 0511 09/25/14 0459 09/26/14 0525  WBC 6.2  6.6 5.8 8.0  NEUTROABS 3.6  --   --   --   HGB 13.1 12.2 12.6 12.9  HCT 40.4 37.4 38.4 39.1  MCV 103.1* 101.9* 99.5 99.5  PLT 181 187 185 188   Cardiac Enzymes:  Recent Labs Lab 09/23/14 2043 09/23/14 2259 09/24/14 0511 09/24/14 1049  TROPONINI 0.53* 0.51* 0.49* 0.49*   BNP: BNP (last 3 results)  Recent Labs  07/21/14 2100 07/23/14 1509 08/20/14 1540  PROBNP 2436.0* 1639.0* 1380.0*   CBG:  Recent Labs Lab 09/25/14 1651 09/25/14 1951 09/25/14 2223 09/26/14 0750 09/26/14 1126  GLUCAP 103* 120* 164* 142* 114*       Signed:  Anvith Mauriello M  [name] 09/26/2014, 12:33 PM

## 2014-09-26 NOTE — Discharge Summary (Signed)
Patient seen and examined. Agree with the above note by NP Toya Smothers. Patient will be discharged on Levaquin to complete treatment for her pneumonia. She has been very noncompliant with her treatment of chronic diseases and medications. Decision has been made this hospitalization to take her completely off anticoagulation and place her on aspirin only given lack of compliance. Safe for discharge home today.  Peggye Pitt, MD [name] Pager: [phone]

## 2014-09-27 LAB — LEGIONELLA ANTIGEN, URINE

## 2014-09-27 LAB — STREP PNEUMONIAE URINARY ANTIGEN: STREP PNEUMO URINARY ANTIGEN: NEGATIVE

## 2014-09-29 LAB — CULTURE, BLOOD (ROUTINE X 2)
Culture: NO GROWTH
Culture: NO GROWTH

## 2014-10-07 ENCOUNTER — Emergency Department (HOSPITAL_COMMUNITY): Payer: Medicaid Other

## 2014-10-07 ENCOUNTER — Encounter (HOSPITAL_COMMUNITY): Payer: Self-pay | Admitting: Cardiology

## 2014-10-07 ENCOUNTER — Emergency Department (HOSPITAL_COMMUNITY)
Admission: EM | Admit: 2014-10-07 | Discharge: 2014-10-08 | Disposition: A | Discharge status: Home / Self Care | Payer: Medicaid Other | Attending: Emergency Medicine | Admitting: Emergency Medicine

## 2014-10-07 DIAGNOSIS — Z720 Tobacco use: Secondary | ICD-10-CM | POA: Insufficient documentation

## 2014-10-07 DIAGNOSIS — Z7952 Long term (current) use of systemic steroids: Secondary | ICD-10-CM | POA: Diagnosis not present

## 2014-10-07 DIAGNOSIS — K219 Gastro-esophageal reflux disease without esophagitis: Secondary | ICD-10-CM | POA: Diagnosis not present

## 2014-10-07 DIAGNOSIS — Z880 Allergy status to penicillin: Secondary | ICD-10-CM | POA: Diagnosis not present

## 2014-10-07 DIAGNOSIS — Z792 Long term (current) use of antibiotics: Secondary | ICD-10-CM | POA: Diagnosis not present

## 2014-10-07 DIAGNOSIS — I5020 Unspecified systolic (congestive) heart failure: Secondary | ICD-10-CM | POA: Diagnosis not present

## 2014-10-07 DIAGNOSIS — Z8701 Personal history of pneumonia (recurrent): Secondary | ICD-10-CM | POA: Insufficient documentation

## 2014-10-07 DIAGNOSIS — Z86711 Personal history of pulmonary embolism: Secondary | ICD-10-CM | POA: Diagnosis not present

## 2014-10-07 DIAGNOSIS — I4891 Unspecified atrial fibrillation: Secondary | ICD-10-CM | POA: Insufficient documentation

## 2014-10-07 DIAGNOSIS — J441 Chronic obstructive pulmonary disease with (acute) exacerbation: Secondary | ICD-10-CM | POA: Diagnosis not present

## 2014-10-07 DIAGNOSIS — Z8619 Personal history of other infectious and parasitic diseases: Secondary | ICD-10-CM | POA: Diagnosis not present

## 2014-10-07 DIAGNOSIS — Z9981 Dependence on supplemental oxygen: Secondary | ICD-10-CM | POA: Diagnosis not present

## 2014-10-07 DIAGNOSIS — Z7982 Long term (current) use of aspirin: Secondary | ICD-10-CM | POA: Insufficient documentation

## 2014-10-07 DIAGNOSIS — E119 Type 2 diabetes mellitus without complications: Secondary | ICD-10-CM | POA: Insufficient documentation

## 2014-10-07 DIAGNOSIS — Z9119 Patient's noncompliance with other medical treatment and regimen: Secondary | ICD-10-CM | POA: Diagnosis not present

## 2014-10-07 DIAGNOSIS — Z79899 Other long term (current) drug therapy: Secondary | ICD-10-CM | POA: Diagnosis not present

## 2014-10-07 DIAGNOSIS — Z8679 Personal history of other diseases of the circulatory system: Secondary | ICD-10-CM | POA: Diagnosis not present

## 2014-10-07 DIAGNOSIS — Z7951 Long term (current) use of inhaled steroids: Secondary | ICD-10-CM | POA: Diagnosis not present

## 2014-10-07 DIAGNOSIS — Z9889 Other specified postprocedural states: Secondary | ICD-10-CM | POA: Diagnosis not present

## 2014-10-07 DIAGNOSIS — Z8611 Personal history of tuberculosis: Secondary | ICD-10-CM | POA: Diagnosis not present

## 2014-10-07 DIAGNOSIS — I2510 Atherosclerotic heart disease of native coronary artery without angina pectoris: Secondary | ICD-10-CM | POA: Insufficient documentation

## 2014-10-07 DIAGNOSIS — F419 Anxiety disorder, unspecified: Secondary | ICD-10-CM | POA: Insufficient documentation

## 2014-10-07 DIAGNOSIS — R0602 Shortness of breath: Secondary | ICD-10-CM | POA: Diagnosis present

## 2014-10-07 DIAGNOSIS — Z7901 Long term (current) use of anticoagulants: Secondary | ICD-10-CM | POA: Diagnosis not present

## 2014-10-07 DIAGNOSIS — G8929 Other chronic pain: Secondary | ICD-10-CM | POA: Diagnosis not present

## 2014-10-07 DIAGNOSIS — I4892 Unspecified atrial flutter: Secondary | ICD-10-CM | POA: Insufficient documentation

## 2014-10-07 DIAGNOSIS — Z950 Presence of cardiac pacemaker: Secondary | ICD-10-CM | POA: Diagnosis not present

## 2014-10-07 DIAGNOSIS — Z8719 Personal history of other diseases of the digestive system: Secondary | ICD-10-CM | POA: Diagnosis not present

## 2014-10-07 LAB — CBC WITH DIFFERENTIAL/PLATELET
BASOS ABS: 0 10*3/uL (ref 0.0–0.1)
Basophils Relative: 0 % (ref 0–1)
EOS ABS: 0.1 10*3/uL (ref 0.0–0.7)
EOS PCT: 1 % (ref 0–5)
HEMATOCRIT: 39.2 % (ref 36.0–46.0)
Hemoglobin: 12.8 g/dL (ref 12.0–15.0)
Lymphocytes Relative: 22 % (ref 12–46)
Lymphs Abs: 2 10*3/uL (ref 0.7–4.0)
MCH: 32.5 pg (ref 26.0–34.0)
MCHC: 32.7 g/dL (ref 30.0–36.0)
MCV: 99.5 fL (ref 78.0–100.0)
Monocytes Absolute: 0.8 10*3/uL (ref 0.1–1.0)
Monocytes Relative: 9 % (ref 3–12)
NEUTROS ABS: 6.2 10*3/uL (ref 1.7–7.7)
Neutrophils Relative %: 68 % (ref 43–77)
Platelets: 259 10*3/uL (ref 150–400)
RBC: 3.94 MIL/uL (ref 3.87–5.11)
RDW: 14.8 % (ref 11.5–15.5)
WBC: 9.1 10*3/uL (ref 4.0–10.5)

## 2014-10-07 LAB — BASIC METABOLIC PANEL
ANION GAP: 11 (ref 5–15)
BUN: 9 mg/dL (ref 6–23)
CALCIUM: 8.9 mg/dL (ref 8.4–10.5)
CO2: 37 mEq/L — ABNORMAL HIGH (ref 19–32)
CREATININE: 1.17 mg/dL — AB (ref 0.50–1.10)
Chloride: 94 mEq/L — ABNORMAL LOW (ref 96–112)
GFR, EST AFRICAN AMERICAN: 62 mL/min — AB (ref >90)
GFR, EST NON AFRICAN AMERICAN: 54 mL/min — AB (ref >90)
Glucose, Bld: 105 mg/dL — ABNORMAL HIGH (ref 70–99)
Potassium: 3.4 mEq/L — ABNORMAL LOW (ref 3.7–5.3)
Sodium: 142 mEq/L (ref 137–147)

## 2014-10-07 LAB — PRO B NATRIURETIC PEPTIDE: PRO B NATRI PEPTIDE: 2314 pg/mL — AB (ref 0–125)

## 2014-10-07 MED ORDER — WARFARIN SODIUM 4 MG PO TABS: ORAL_TABLET | ORAL | Status: AC

## 2014-10-07 MED ORDER — ALBUTEROL SULFATE (2.5 MG/3ML) 0.083% IN NEBU
2.5000 mg | INHALATION_SOLUTION | Freq: Once | RESPIRATORY_TRACT | Status: AC
  Administered 2014-10-07: 2.5 mg via RESPIRATORY_TRACT
  Filled 2014-10-07: qty 3

## 2014-10-07 MED ORDER — MORPHINE SULFATE 2 MG/ML IJ SOLN
2.0000 mg | Freq: Once | INTRAMUSCULAR | Status: AC
Start: 2014-10-07 — End: 2014-10-07
  Administered 2014-10-07: 2 mg via INTRAVENOUS
  Filled 2014-10-07: qty 1

## 2014-10-07 MED ORDER — IPRATROPIUM-ALBUTEROL 0.5-2.5 (3) MG/3ML IN SOLN
3.0000 mL | Freq: Once | RESPIRATORY_TRACT | Status: AC
  Administered 2014-10-07: 3 mL via RESPIRATORY_TRACT
  Filled 2014-10-07: qty 3

## 2014-10-07 MED ORDER — METHYLPREDNISOLONE SODIUM SUCC 125 MG IJ SOLR
125.0000 mg | Freq: Once | INTRAMUSCULAR | Status: AC
  Administered 2014-10-07: 125 mg via INTRAVENOUS
  Filled 2014-10-07: qty 2

## 2014-10-07 MED ORDER — HYDROCODONE-ACETAMINOPHEN 5-325 MG PO TABS
1.0000 | ORAL_TABLET | Freq: Once | ORAL | Status: AC
  Administered 2014-10-08: 1 via ORAL
  Filled 2014-10-07: qty 1

## 2014-10-07 MED ORDER — ONDANSETRON HCL 4 MG/2ML IJ SOLN
4.0000 mg | Freq: Once | INTRAMUSCULAR | Status: AC
  Administered 2014-10-07: 4 mg via INTRAVENOUS
  Filled 2014-10-07: qty 2

## 2014-10-07 NOTE — ED Notes (Signed)
Care handoff charted in error, pt still in room ER6

## 2014-10-07 NOTE — Discharge Instructions (Signed)
Follow up with Kim [name] this week.

## 2014-10-07 NOTE — ED Provider Notes (Signed)
CSN: [id number]     Arrival date & time 10/07/14  1816 History  This chart was scribed for Benny Lennert, MD by Haywood Pao, ED Scribe. The [facility] and the patient's care was started at 7:07 PM.  Chief Complaint  Patient presents with  . Shortness of Breath   Patient is a 48 y.o. female presenting with shortness of breath. The history is provided by the patient. No language interpreter was used.  Shortness of Breath Severity:  Mild Onset quality:  Sudden Timing:  Constant Chronicity:  New Relieved by:  Nothing Ineffective treatments:  None tried Associated symptoms: chest pain   Associated symptoms: no abdominal pain, no cough, no headaches and no rash     HPI Comments: ARBELL LEITH is a 48 y.o. female with a history of A-Fib who presents to the Emergency Department complaining of SOB. She has nausea and CP as associated symptoms. She was here for a similar complaint two weeks ago. Pt does not have a PCP. Pt has a pacemaker and is supposed to make an appointment with Dr. Adline Potter after she was discharged but did not.   Past Medical History  Diagnosis Date  . COPD (chronic obstructive pulmonary disease)     Home O2 3L  . Coronary atherosclerosis of native coronary artery     Cardiac catheterization 07/2012 - LAD 40%; small D2 with 60 to 70% ostial; OM1 30 to 40%; RCA 20%; PL1 40%   . Hypothyroidism   . Hepatitis C   . Pneumonia   . Cirrhosis   . Type 2 diabetes mellitus   . GERD (gastroesophageal reflux disease)   . Headache(784.0)   . Anxiety   . Atrial fibrillation and flutter   . Chronic pain   . PE (pulmonary embolism)   . Chronic anticoagulation   . History of medication noncompliance   . SSS (sick sinus syndrome)     Biotronik pacemaker  . Cardiomyopathy     LVEF 40-45%   Past Surgical History  Procedure Laterality Date  . Cholecystectomy    . Pacemaker insertion  02/2012    Biotronik PPM   . Cardiac electrophysiology study and  ablation    . Esophagogastroduodenoscopy  Jan 2010    Dr. Arlyce Dice: multiple gastric erosions, benign path  . Esophagogastroduodenoscopy N/A 01/10/2014    WUJ:[id number] EG junction. Query short segment Barrett's esophagus-status post pinch biopsy as described above. Hiatal hernia; otherwise negative EGD  . Insert / replace / remove pacemaker     Family History  Problem Relation Age of Onset  . Coronary artery disease Father 26  . Coronary artery disease Mother 45  . Coronary artery disease Brother   . Colon cancer Neg Hx    History  Substance Use Topics  . Smoking status: Current Every Day Smoker -- 0.50 packs/day for 30 years    Types: Cigarettes  . Smokeless tobacco: Current User  . Alcohol Use: No   OB History    No data available     Review of Systems  Constitutional: Negative for appetite change and fatigue.  HENT: Negative for congestion, ear discharge and sinus pressure.   Eyes: Negative for discharge.  Respiratory: Positive for shortness of breath. Negative for cough.   Cardiovascular: Positive for chest pain.  Gastrointestinal: Positive for nausea. Negative for abdominal pain and diarrhea.  Genitourinary: Negative for frequency and hematuria.  Musculoskeletal: Negative for back pain.  Skin: Negative for rash.  Neurological: Negative for  seizures and headaches.  Psychiatric/Behavioral: Negative for hallucinations.      Allergies  Nitroglycerin; Doxycycline; Flexeril; Ibuprofen; Tramadol; Tylenol; Vesicare; Hydralazine; and Amoxil  Home Medications   Prior to Admission medications   Medication Sig Start Date End Date Taking? Authorizing Provider  albuterol (PROVENTIL HFA;VENTOLIN HFA) 108 (90 BASE) MCG/ACT inhaler Inhale 2 puffs into the lungs every 6 (six) hours as needed for wheezing or shortness of breath.     Historical Provider, MD  amLODipine (NORVASC) 2.5 MG tablet Take 1 tablet (2.5 mg total) by mouth daily. 07/01/14   Standley Brooking, MD  aspirin 325 MG  tablet Take 1 tablet (325 mg total) by mouth daily. 09/26/14   Gwenyth Bender, NP  budesonide-formoterol (SYMBICORT) 80-4.5 MCG/ACT inhaler Inhale 2 puffs into the lungs 2 (two) times daily.    Historical Provider, MD  furosemide (LASIX) 80 MG tablet Take 1 tablet (80 mg total) by mouth daily. 08/24/14   Gwenyth Bender, NP  ipratropium-albuterol (DUONEB) 0.5-2.5 (3) MG/3ML SOLN Take 3 mLs by nebulization 3 (three) times daily.    Historical Provider, MD  levofloxacin (LEVAQUIN) 250 MG tablet Take 1 tablet (250 mg total) by mouth daily. 09/26/14   Gwenyth Bender, NP  magnesium oxide (MAG-OX) 400 (241.3 MG) MG tablet Take 1 tablet (400 mg total) by mouth daily. 09/26/14   Gwenyth Bender, NP  metFORMIN (GLUCOPHAGE) 500 MG tablet Take 1 tablet (500 mg total) by mouth at bedtime. 08/24/14   Gwenyth Bender, NP  metoprolol succinate (TOPROL-XL) 25 MG 24 hr tablet Take 1 tablet (25 mg total) by mouth daily. 06/14/14   Gwenyth Bender, NP  naproxen (NAPROSYN) 375 MG tablet Take 1 tablet (375 mg total) by mouth 2 (two) times daily as needed for mild pain or moderate pain. 09/26/14   Gwenyth Bender, NP  omeprazole (PRILOSEC) 40 MG capsule Take 1 capsule (40 mg total) by mouth daily. 09/04/14   Carlisle Beers Molpus, MD  potassium chloride 20 MEQ TBCR Take 20 mEq by mouth daily. Take with furosemide (Lasix) 08/24/14   Elliot Cousin, MD  predniSONE (DELTASONE) 20 MG tablet Take 1 tablet (20 mg total) by mouth 2 (two) times daily with a meal. 09/26/14   Gwenyth Bender, NP  QUEtiapine (SEROQUEL) 25 MG tablet Take 1 tablet (25 mg total) by mouth at bedtime. 08/24/14   Gwenyth Bender, NP   BP 156/84 mmHg  Pulse 88  Temp(Src) 98.3 F (36.8 C) (Oral)  Resp 20  Ht 5\' 9"  (1.753 m)  Wt 235 lb (106.595 kg)  BMI 34.69 kg/m2  SpO2 97%  LMP 02/08/2007 Physical Exam  Constitutional: She is oriented to person, place, and time. She appears well-developed.  HENT:  Head: Normocephalic.  Eyes: Conjunctivae and EOM are normal. No scleral  icterus.  Neck: Neck supple. No thyromegaly present.  Cardiovascular: Normal rate and regular rhythm.  Exam reveals no gallop and no friction rub.   No murmur heard. Pulmonary/Chest: No stridor. She has no wheezes. She has no rales. She exhibits no tenderness.  Moderate wheezing bilaterally.   Abdominal: She exhibits no distension. There is no tenderness. There is no rebound.  Musculoskeletal: Normal range of motion. She exhibits no edema.  Lymphadenopathy:    She has no cervical adenopathy.  Neurological: She is oriented to person, place, and time. She exhibits normal muscle tone. Coordination normal.  Skin: No rash noted. No erythema.  Psychiatric: She has a normal mood and affect. Her  behavior is normal.    ED Course  Procedures  DIAGNOSTIC STUDIES: Oxygen Saturation is 97% on room air, normal by my interpretation.    COORDINATION OF CARE: 7:10 PM Discussed treatment plan with pt at bedside and pt agreed to plan.  Labs Review Labs Reviewed - No data to display  Imaging Review No results found.   EKG Interpretation None      MDM   Final diagnoses:  None  pt stable with chf.  Pt to start back coumadin and follow up this week.   The chart was scribed for me under my direct supervision.  I personally performed the history, physical, and medical decision making and all procedures in the evaluation of this patient.Benny Lennert.     Grae Cannata L Mandela Bello, MD 10/07/14 [phone]

## 2014-10-07 NOTE — ED Notes (Signed)
Just discharged about a week ago  from the hospital with pneumonia.   C/o worsening sob last 4-5 days.

## 2014-10-07 NOTE — ED Notes (Signed)
Report given to Londra RN on 300 

## 2014-10-19 ENCOUNTER — Encounter (HOSPITAL_COMMUNITY): Payer: Self-pay | Admitting: Emergency Medicine

## 2014-10-19 ENCOUNTER — Emergency Department (HOSPITAL_COMMUNITY)
Admission: EM | Admit: 2014-10-19 | Discharge: 2014-10-19 | Disposition: A | Discharge status: Home / Self Care | Payer: Medicaid Other | Attending: Emergency Medicine | Admitting: Emergency Medicine

## 2014-10-19 ENCOUNTER — Emergency Department (HOSPITAL_COMMUNITY): Payer: Medicaid Other

## 2014-10-19 DIAGNOSIS — Z86711 Personal history of pulmonary embolism: Secondary | ICD-10-CM | POA: Insufficient documentation

## 2014-10-19 DIAGNOSIS — R0600 Dyspnea, unspecified: Secondary | ICD-10-CM | POA: Diagnosis not present

## 2014-10-19 DIAGNOSIS — J441 Chronic obstructive pulmonary disease with (acute) exacerbation: Secondary | ICD-10-CM | POA: Insufficient documentation

## 2014-10-19 DIAGNOSIS — Z792 Long term (current) use of antibiotics: Secondary | ICD-10-CM | POA: Diagnosis not present

## 2014-10-19 DIAGNOSIS — G8929 Other chronic pain: Secondary | ICD-10-CM | POA: Insufficient documentation

## 2014-10-19 DIAGNOSIS — Z7952 Long term (current) use of systemic steroids: Secondary | ICD-10-CM | POA: Insufficient documentation

## 2014-10-19 DIAGNOSIS — Z8701 Personal history of pneumonia (recurrent): Secondary | ICD-10-CM | POA: Diagnosis not present

## 2014-10-19 DIAGNOSIS — Z720 Tobacco use: Secondary | ICD-10-CM | POA: Diagnosis not present

## 2014-10-19 DIAGNOSIS — Z79899 Other long term (current) drug therapy: Secondary | ICD-10-CM | POA: Insufficient documentation

## 2014-10-19 DIAGNOSIS — R0602 Shortness of breath: Secondary | ICD-10-CM | POA: Diagnosis present

## 2014-10-19 DIAGNOSIS — I2510 Atherosclerotic heart disease of native coronary artery without angina pectoris: Secondary | ICD-10-CM | POA: Insufficient documentation

## 2014-10-19 DIAGNOSIS — Z7901 Long term (current) use of anticoagulants: Secondary | ICD-10-CM | POA: Insufficient documentation

## 2014-10-19 DIAGNOSIS — Z8719 Personal history of other diseases of the digestive system: Secondary | ICD-10-CM | POA: Insufficient documentation

## 2014-10-19 LAB — CBC WITH DIFFERENTIAL/PLATELET
Basophils Absolute: 0 10*3/uL (ref 0.0–0.1)
Basophils Relative: 0 % (ref 0–1)
Eosinophils Absolute: 0.2 10*3/uL (ref 0.0–0.7)
Eosinophils Relative: 3 % (ref 0–5)
HCT: 37.7 % (ref 36.0–46.0)
Hemoglobin: 12.1 g/dL (ref 12.0–15.0)
Lymphocytes Relative: 27 % (ref 12–46)
Lymphs Abs: 1.9 10*3/uL (ref 0.7–4.0)
MCH: 32.7 pg (ref 26.0–34.0)
MCHC: 32.1 g/dL (ref 30.0–36.0)
MCV: 101.9 fL — ABNORMAL HIGH (ref 78.0–100.0)
Monocytes Absolute: 0.6 10*3/uL (ref 0.1–1.0)
Monocytes Relative: 9 % (ref 3–12)
Neutro Abs: 4.4 10*3/uL (ref 1.7–7.7)
Neutrophils Relative %: 61 % (ref 43–77)
Platelets: 223 10*3/uL (ref 150–400)
RBC: 3.7 MIL/uL — ABNORMAL LOW (ref 3.87–5.11)
RDW: 15.4 % (ref 11.5–15.5)
WBC: 7.2 10*3/uL (ref 4.0–10.5)

## 2014-10-19 LAB — BASIC METABOLIC PANEL WITH GFR
Anion gap: 10 (ref 5–15)
BUN: 7 mg/dL (ref 6–23)
CO2: 33 meq/L — ABNORMAL HIGH (ref 19–32)
Calcium: 8.8 mg/dL (ref 8.4–10.5)
Chloride: 100 meq/L (ref 96–112)
Creatinine, Ser: 0.85 mg/dL (ref 0.50–1.10)
GFR calc Af Amer: >90 mL/min
GFR calc non Af Amer: 79 mL/min — ABNORMAL LOW
Glucose, Bld: 105 mg/dL — ABNORMAL HIGH (ref 70–99)
Potassium: 3.4 meq/L — ABNORMAL LOW (ref 3.7–5.3)
Sodium: 143 meq/L (ref 137–147)

## 2014-10-19 LAB — TROPONIN I: Troponin I: 0.53 ng/mL

## 2014-10-19 MED ORDER — IPRATROPIUM-ALBUTEROL 0.5-2.5 (3) MG/3ML IN SOLN
3.0000 mL | Freq: Once | RESPIRATORY_TRACT | Status: AC
  Administered 2014-10-19: 3 mL via RESPIRATORY_TRACT
  Filled 2014-10-19: qty 3

## 2014-10-19 MED ORDER — LORAZEPAM 1 MG PO TABS: 0.5000 mg | ORAL_TABLET | Freq: Three times a day (TID) | ORAL | Status: DC | PRN

## 2014-10-19 MED ORDER — PREDNISONE 50 MG PO TABS
60.0000 mg | ORAL_TABLET | Freq: Once | ORAL | Status: AC
  Administered 2014-10-19: 60 mg via ORAL
  Filled 2014-10-19 (×2): qty 1

## 2014-10-19 MED ORDER — POTASSIUM CHLORIDE CRYS ER 20 MEQ PO TBCR
60.0000 meq | EXTENDED_RELEASE_TABLET | Freq: Once | ORAL | Status: AC
  Administered 2014-10-19: 60 meq via ORAL
  Filled 2014-10-19: qty 3

## 2014-10-19 MED ORDER — FUROSEMIDE 10 MG/ML IJ SOLN
60.0000 mg | Freq: Once | INTRAMUSCULAR | Status: AC
Start: 2014-10-19 — End: 2014-10-19
  Administered 2014-10-19: 60 mg via INTRAVENOUS
  Filled 2014-10-19: qty 6

## 2014-10-19 MED ORDER — HYDROMORPHONE HCL 1 MG/ML IJ SOLN
1.0000 mg | Freq: Once | INTRAMUSCULAR | Status: AC
  Administered 2014-10-19: 1 mg via INTRAVENOUS
  Filled 2014-10-19: qty 1

## 2014-10-19 MED ORDER — LORAZEPAM 2 MG/ML IJ SOLN
1.0000 mg | Freq: Once | INTRAMUSCULAR | Status: AC
  Administered 2014-10-19: 1 mg via INTRAVENOUS
  Filled 2014-10-19: qty 1

## 2014-10-19 NOTE — ED Notes (Signed)
Pt has c/o of sob, chest pain and anxiety for last 4-5 days, recently lost mother.

## 2014-10-19 NOTE — ED Notes (Signed)
CRITICAL VALUE ALERT  Critical value received:  Troponin 0.53  Date of notification:  10/19/14  Time of notification:  2302  Critical value read back:Yes.    Nurse who received alert:  Y. Daleen Squibb, RN  MD notified (1st page):  2103  Time of first page:  2103  Responding MD:  Dr. Wenda Low  Time MD responded:  2103

## 2014-10-19 NOTE — ED Provider Notes (Signed)
CSN: [id number]     Arrival date & time 10/19/14  2008 History   First MD Initiated Contact with Patient 10/19/14 2014     Chief Complaint  Patient presents with  . Shortness of Breath     (Consider location/radiation/quality/duration/timing/severity/associated sxs/prior Treatment) HPI   48 year old female with numerous complaints, primarily dyspnea. Patient reports that her shortness of breath is beginning worse over the past 4-5 days. Increase cough. Occasionally productive for whitish sputum. Dull constant chest pain substernally over the same time. No appreciable exacerbating relieving factors. No unusual leg pain or swelling. Patient has past history of COPD chronically on 2-3 L of oxygen, sick sinus syndrome status post pacemaker, diastolic heart failure, pulmonary embolism, non-obstructive CAD. Historically noncompliant.  Pt is telling me she is taking her coumadin although decision was made to take her off of it and only ASA on discharge from most recent hospitalization. Reports being very upset over recent death of mother.   Past Medical History  Diagnosis Date  . COPD (chronic obstructive pulmonary disease)     Home O2 3L  . Coronary atherosclerosis of native coronary artery     Cardiac catheterization 07/2012 - LAD 40%; small D2 with 60 to 70% ostial; OM1 30 to 40%; RCA 20%; PL1 40%   . Hypothyroidism   . Hepatitis C   . Pneumonia   . Cirrhosis   . Type 2 diabetes mellitus   . GERD (gastroesophageal reflux disease)   . Headache(784.0)   . Anxiety   . Atrial fibrillation and flutter   . Chronic pain   . PE (pulmonary embolism)   . Chronic anticoagulation   . History of medication noncompliance   . SSS (sick sinus syndrome)     Biotronik pacemaker  . Cardiomyopathy     LVEF 40-45%   Past Surgical History  Procedure Laterality Date  . Cholecystectomy    . Pacemaker insertion  02/2012    Biotronik PPM   . Cardiac electrophysiology study and ablation    .  Esophagogastroduodenoscopy  Jan 2010    Dr. Arlyce Dice: multiple gastric erosions, benign path  . Esophagogastroduodenoscopy N/A 01/10/2014    [id number] EG junction. Query short segment Barrett's esophagus-status post pinch biopsy as described above. Hiatal hernia; otherwise negative EGD  . Insert / replace / remove pacemaker     Family History  Problem Relation Age of Onset  . Coronary artery disease Father 57  . Coronary artery disease Mother 75  . Coronary artery disease Brother   . Colon cancer Neg Hx    History  Substance Use Topics  . Smoking status: Current Every Day Smoker -- 0.50 packs/day for 30 years    Types: Cigarettes  . Smokeless tobacco: Current User  . Alcohol Use: No   OB History    No data available     Review of Systems  All systems reviewed and negative, other than as noted in HPI.   Allergies  Nitroglycerin; Doxycycline; Flexeril; Ibuprofen; Tramadol; Tylenol; Vesicare; Aspirin; Hydralazine; and Amoxil  Home Medications   Prior to Admission medications   Medication Sig Start Date End Date Taking? Authorizing Provider  albuterol (PROVENTIL HFA;VENTOLIN HFA) 108 (90 BASE) MCG/ACT inhaler Inhale 2 puffs into the lungs every 6 (six) hours as needed for wheezing or shortness of breath.     Historical Provider, MD  amLODipine (NORVASC) 2.5 MG tablet Take 1 tablet (2.5 mg total) by mouth daily. 07/01/14   Standley Brooking, MD  aspirin 325 MG tablet  Take 1 tablet (325 mg total) by mouth daily. 09/26/14   Gwenyth BenderKaren M Black, NP  budesonide-formoterol (SYMBICORT) 80-4.5 MCG/ACT inhaler Inhale 2 puffs into the lungs 2 (two) times daily.    Historical Provider, MD  furosemide (LASIX) 80 MG tablet Take 1 tablet (80 mg total) by mouth daily. 08/24/14   Gwenyth BenderKaren M Black, NP  ipratropium-albuterol (DUONEB) 0.5-2.5 (3) MG/3ML SOLN Take 3 mLs by nebulization 3 (three) times daily.    Historical Provider, MD  levofloxacin (LEVAQUIN) 250 MG tablet Take 1 tablet (250 mg total) by mouth  daily. 09/26/14   Gwenyth BenderKaren M Black, NP  magnesium oxide (MAG-OX) 400 (241.3 MG) MG tablet Take 1 tablet (400 mg total) by mouth daily. 09/26/14   Gwenyth BenderKaren M Black, NP  metFORMIN (GLUCOPHAGE) 500 MG tablet Take 1 tablet (500 mg total) by mouth at bedtime. 08/24/14   Gwenyth BenderKaren M Black, NP  metoprolol succinate (TOPROL-XL) 25 MG 24 hr tablet Take 1 tablet (25 mg total) by mouth daily. 06/14/14   Gwenyth BenderKaren M Black, NP  naproxen (NAPROSYN) 375 MG tablet Take 1 tablet (375 mg total) by mouth 2 (two) times daily as needed for mild pain or moderate pain. 09/26/14   Gwenyth BenderKaren M Black, NP  omeprazole (PRILOSEC) 40 MG capsule Take 1 capsule (40 mg total) by mouth daily. 09/04/14   Carlisle BeersJohn L Molpus, MD  potassium chloride 20 MEQ TBCR Take 20 mEq by mouth daily. Take with furosemide (Lasix) 08/24/14   Elliot Cousinenise Fisher, MD  predniSONE (DELTASONE) 20 MG tablet Take 1 tablet (20 mg total) by mouth 2 (two) times daily with a meal. 09/26/14   Gwenyth BenderKaren M Black, NP  Pseudoeph-Doxylamine-DM-APAP (NYQUIL PO) Take 2 capsules by mouth once as needed (for congestion).    Historical Provider, MD  QUEtiapine (SEROQUEL) 25 MG tablet Take 1 tablet (25 mg total) by mouth at bedtime. 08/24/14   Gwenyth BenderKaren M Black, NP  warfarin (COUMADIN) 4 MG tablet Take one tablet a day 10/07/14   Benny LennertJoseph L Zammit, MD   BP 147/96 mmHg  Pulse 83  Temp(Src) 97.5 F (36.4 C) (Oral)  Resp 21  Ht 5\' 9"  (1.753 m)  Wt 235 lb (106.595 kg)  BMI 34.69 kg/m2  SpO2 96%  LMP 02/08/2007 Physical Exam  Constitutional: She appears well-developed and well-nourished. No distress.  HENT:  Head: Normocephalic and atraumatic.  Eyes: Conjunctivae are normal. Right eye exhibits no discharge. Left eye exhibits no discharge.  Neck: Neck supple.  Cardiovascular: Normal rate, regular rhythm and normal heart sounds.  Exam reveals no gallop and no friction rub.   No murmur heard. Pulmonary/Chest: Effort normal and breath sounds normal. No respiratory distress.  Abdominal: Soft. She exhibits no  distension. There is no tenderness.  Musculoskeletal: She exhibits no edema or tenderness.  Neurological: She is alert.  Skin: Skin is warm and dry.  Psychiatric: She has a normal mood and affect. Her behavior is normal. Thought content normal.  Nursing note and vitals reviewed.   ED Course  Procedures (including critical care time) Labs Review Labs Reviewed  CBC WITH DIFFERENTIAL - Abnormal; Notable for the following:    RBC 3.70 (*)    MCV 101.9 (*)    All other components within normal limits  BASIC METABOLIC PANEL  TROPONIN I    Imaging Review Dg Chest 2 View  10/19/2014   CLINICAL DATA:  Shortness of breath, chest pain and anxiety for 4-5 days. Intermittent productive cough. History of cirrhosis, atrial fibrillation and cardiomyopathy.  EXAM: CHEST  2 VIEW  COMPARISON:  Chest radiograph October 07, 2014  FINDINGS: Cardiac silhouette appears mild to moderately enlarged. Mild central pulmonary vascular congestion. Mediastinal silhouette is nonsuspicious. Strandy densities RIGHT lung base, improved aeration with resolution of RIGHT pleural effusion present on prior imaging. Dual lead LEFT cardiac pacemaker in situ. No pneumothorax. Soft tissue planes and included osseous structures are nonsuspicious.  IMPRESSION: Mild to moderate cardiomegaly and central pulmonary vascular congestion.  RIGHT lung base strandy densities may reflect atelectasis or scarring without pleural effusion on today's examination.   Electronically Signed   By: Awilda Metro   On: 10/19/2014 22:04     EKG Interpretation   Date/Time:  Friday October 19 2014 20:18:20 EST Ventricular Rate:  83 PR Interval:    QRS Duration: 184 QT Interval:  477 QTC Calculation: 561 R Axis:   -83 Text Interpretation:  Atrial flutter Ventricular preexcitation(WPW) ED  PHYSICIAN INTERPRETATION AVAILABLE IN [facility] Confirmed by TEST,  Record ([id number]) on 10/21/2014 12:41:40 PM      MDM   Final diagnoses:   Dyspnea    Pt has chronically elevated troponin per review of records. Partial summary from review of prior notes as follows:  Chest pain and elevated troponin - patient with chronically elevated troponin. Troponin levels remained at baseline range. History of thorough cardiac work up. Per cardiology note dated 08/21/14 as follows.  1. Low risk stress test July 2015, non-obstructive "mild" disease on cath in 2013. 2. Plan to minimize narcotics during admit at this time due to ongoing concern of drug seeking behavior documented by other providers previously in chart: (Cards consult on 08/21/14, Cards consult on 06/29/14, Dr. Rene Kocher progress notes on 7/24-26/15, Cards consult on 06/13/14, among numerous others). 3. No complaints chest pain this am. Chart review indicates troponin level at baseline range. Considered Imdur but chart indicates allergy/reaction.      Raeford Razor, MD 10/25/14 [phone]

## 2014-10-19 NOTE — Discharge Instructions (Signed)
Shortness of Breath °Shortness of breath means you have trouble breathing. Shortness of breath needs medical care right away. °HOME CARE  °· Do not smoke. °· Avoid being around chemicals or things (paint fumes, dust) that may bother your breathing. °· Rest as needed. Slowly begin your normal activities. °· Only take medicines as told by your doctor. °· Keep all doctor visits as told. °GET HELP RIGHT AWAY IF:  °· Your shortness of breath gets worse. °· You feel lightheaded, pass out (faint), or have a cough that is not helped by medicine. °· You cough up blood. °· You have pain with breathing. °· You have pain in your chest, arms, shoulders, or belly (abdomen). °· You have a fever. °· You cannot walk up stairs or exercise the way you normally do. °· You do not get better in the time expected. °· You have a hard time doing normal activities even with rest. °· You have problems with your medicines. °· You have any new symptoms. °MAKE SURE YOU: °· Understand these instructions. °· Will watch your condition. °· Will get help right away if you are not doing well or get worse. °Document Released: 05/11/2008 Document Revised: 11/28/2013 Document Reviewed: 02/08/2012 °[employer]® Patient Information ©2015 [employer]. This information is not intended to replace advice given to you by your health care provider. Make sure you discuss any questions you have with your health care provider. ° °

## 2014-10-21 ENCOUNTER — Encounter (HOSPITAL_COMMUNITY): Payer: Self-pay

## 2014-10-21 ENCOUNTER — Emergency Department (HOSPITAL_COMMUNITY): Payer: Medicaid Other

## 2014-10-21 ENCOUNTER — Inpatient Hospital Stay (HOSPITAL_COMMUNITY)
Admission: EM | Admit: 2014-10-21 | Discharge: 2014-10-25 | DRG: 194 | Disposition: A | Discharge status: Home / Self Care | Payer: Medicaid Other | Attending: Internal Medicine | Admitting: Internal Medicine

## 2014-10-21 DIAGNOSIS — B1920 Unspecified viral hepatitis C without hepatic coma: Secondary | ICD-10-CM | POA: Diagnosis present

## 2014-10-21 DIAGNOSIS — J9610 Chronic respiratory failure, unspecified whether with hypoxia or hypercapnia: Secondary | ICD-10-CM | POA: Diagnosis present

## 2014-10-21 DIAGNOSIS — K219 Gastro-esophageal reflux disease without esophagitis: Secondary | ICD-10-CM | POA: Diagnosis present

## 2014-10-21 DIAGNOSIS — J189 Pneumonia, unspecified organism: Secondary | ICD-10-CM | POA: Diagnosis present

## 2014-10-21 DIAGNOSIS — Z86711 Personal history of pulmonary embolism: Secondary | ICD-10-CM

## 2014-10-21 DIAGNOSIS — Z7289 Other problems related to lifestyle: Secondary | ICD-10-CM

## 2014-10-21 DIAGNOSIS — I2510 Atherosclerotic heart disease of native coronary artery without angina pectoris: Secondary | ICD-10-CM | POA: Diagnosis present

## 2014-10-21 DIAGNOSIS — Z9981 Dependence on supplemental oxygen: Secondary | ICD-10-CM

## 2014-10-21 DIAGNOSIS — K7460 Unspecified cirrhosis of liver: Secondary | ICD-10-CM | POA: Diagnosis present

## 2014-10-21 DIAGNOSIS — I10 Essential (primary) hypertension: Secondary | ICD-10-CM | POA: Diagnosis present

## 2014-10-21 DIAGNOSIS — R079 Chest pain, unspecified: Secondary | ICD-10-CM | POA: Diagnosis present

## 2014-10-21 DIAGNOSIS — Z765 Malingerer [conscious simulation]: Secondary | ICD-10-CM | POA: Diagnosis present

## 2014-10-21 DIAGNOSIS — E876 Hypokalemia: Secondary | ICD-10-CM | POA: Diagnosis present

## 2014-10-21 DIAGNOSIS — Z950 Presence of cardiac pacemaker: Secondary | ICD-10-CM

## 2014-10-21 DIAGNOSIS — R05 Cough: Secondary | ICD-10-CM

## 2014-10-21 DIAGNOSIS — J441 Chronic obstructive pulmonary disease with (acute) exacerbation: Secondary | ICD-10-CM | POA: Diagnosis present

## 2014-10-21 DIAGNOSIS — R778 Other specified abnormalities of plasma proteins: Secondary | ICD-10-CM | POA: Diagnosis present

## 2014-10-21 DIAGNOSIS — J449 Chronic obstructive pulmonary disease, unspecified: Secondary | ICD-10-CM | POA: Diagnosis present

## 2014-10-21 DIAGNOSIS — I255 Ischemic cardiomyopathy: Secondary | ICD-10-CM | POA: Diagnosis present

## 2014-10-21 DIAGNOSIS — Z792 Long term (current) use of antibiotics: Secondary | ICD-10-CM

## 2014-10-21 DIAGNOSIS — F17210 Nicotine dependence, cigarettes, uncomplicated: Secondary | ICD-10-CM | POA: Diagnosis present

## 2014-10-21 DIAGNOSIS — I509 Heart failure, unspecified: Secondary | ICD-10-CM

## 2014-10-21 DIAGNOSIS — I5032 Chronic diastolic (congestive) heart failure: Secondary | ICD-10-CM | POA: Diagnosis present

## 2014-10-21 DIAGNOSIS — J440 Chronic obstructive pulmonary disease with acute lower respiratory infection: Secondary | ICD-10-CM | POA: Diagnosis present

## 2014-10-21 DIAGNOSIS — Z7901 Long term (current) use of anticoagulants: Secondary | ICD-10-CM

## 2014-10-21 DIAGNOSIS — R042 Hemoptysis: Secondary | ICD-10-CM | POA: Insufficient documentation

## 2014-10-21 DIAGNOSIS — R059 Cough, unspecified: Secondary | ICD-10-CM

## 2014-10-21 DIAGNOSIS — F418 Other specified anxiety disorders: Secondary | ICD-10-CM | POA: Diagnosis present

## 2014-10-21 DIAGNOSIS — Z9114 Patient's other noncompliance with medication regimen: Secondary | ICD-10-CM | POA: Diagnosis present

## 2014-10-21 DIAGNOSIS — Z7982 Long term (current) use of aspirin: Secondary | ICD-10-CM

## 2014-10-21 DIAGNOSIS — R7989 Other specified abnormal findings of blood chemistry: Secondary | ICD-10-CM | POA: Diagnosis present

## 2014-10-21 DIAGNOSIS — E119 Type 2 diabetes mellitus without complications: Secondary | ICD-10-CM | POA: Diagnosis present

## 2014-10-21 DIAGNOSIS — Z7952 Long term (current) use of systemic steroids: Secondary | ICD-10-CM

## 2014-10-21 DIAGNOSIS — I4891 Unspecified atrial fibrillation: Secondary | ICD-10-CM | POA: Diagnosis present

## 2014-10-21 DIAGNOSIS — G894 Chronic pain syndrome: Secondary | ICD-10-CM | POA: Diagnosis present

## 2014-10-21 DIAGNOSIS — Z8249 Family history of ischemic heart disease and other diseases of the circulatory system: Secondary | ICD-10-CM

## 2014-10-21 DIAGNOSIS — E039 Hypothyroidism, unspecified: Secondary | ICD-10-CM | POA: Diagnosis present

## 2014-10-21 DIAGNOSIS — Y95 Nosocomial condition: Secondary | ICD-10-CM | POA: Diagnosis present

## 2014-10-21 LAB — TROPONIN I: Troponin I: 0.54 ng/mL (ref <0.30)

## 2014-10-21 LAB — BASIC METABOLIC PANEL
ANION GAP: 10 (ref 5–15)
BUN: 12 mg/dL (ref 6–23)
CHLORIDE: 102 meq/L (ref 96–112)
CO2: 31 meq/L (ref 19–32)
CREATININE: 0.83 mg/dL (ref 0.50–1.10)
Calcium: 8.7 mg/dL (ref 8.4–10.5)
GFR calc Af Amer: >90 mL/min (ref >90)
GFR calc non Af Amer: 81 mL/min — ABNORMAL LOW (ref >90)
Glucose, Bld: 88 mg/dL (ref 70–99)
Potassium: 4.2 mEq/L (ref 3.7–5.3)
Sodium: 143 mEq/L (ref 137–147)

## 2014-10-21 LAB — PROTIME-INR
INR: 2.73 — ABNORMAL HIGH (ref 0.00–1.49)
PROTHROMBIN TIME: 29.2 s — AB (ref 11.6–15.2)

## 2014-10-21 LAB — CBC WITH DIFFERENTIAL/PLATELET
BASOS PCT: 0 % (ref 0–1)
Basophils Absolute: 0 10*3/uL (ref 0.0–0.1)
Eosinophils Absolute: 0.1 10*3/uL (ref 0.0–0.7)
Eosinophils Relative: 1 % (ref 0–5)
HEMATOCRIT: 37.1 % (ref 36.0–46.0)
HEMOGLOBIN: 12 g/dL (ref 12.0–15.0)
LYMPHS ABS: 2.5 10*3/uL (ref 0.7–4.0)
Lymphocytes Relative: 26 % (ref 12–46)
MCH: 32.9 pg (ref 26.0–34.0)
MCHC: 32.3 g/dL (ref 30.0–36.0)
MCV: 101.6 fL — ABNORMAL HIGH (ref 78.0–100.0)
MONO ABS: 0.7 10*3/uL (ref 0.1–1.0)
MONOS PCT: 8 % (ref 3–12)
NEUTROS ABS: 6.4 10*3/uL (ref 1.7–7.7)
Neutrophils Relative %: 65 % (ref 43–77)
Platelets: 229 10*3/uL (ref 150–400)
RBC: 3.65 MIL/uL — ABNORMAL LOW (ref 3.87–5.11)
RDW: 15.5 % (ref 11.5–15.5)
WBC: 9.7 10*3/uL (ref 4.0–10.5)

## 2014-10-21 MED ORDER — LORAZEPAM 2 MG/ML IJ SOLN
1.0000 mg | Freq: Once | INTRAMUSCULAR | Status: AC
  Administered 2014-10-21: 1 mg via INTRAVENOUS
  Filled 2014-10-21: qty 1

## 2014-10-21 MED ORDER — CEFTRIAXONE SODIUM 1 G IJ SOLR: 1.0000 g | Freq: Once | INTRAMUSCULAR | Status: DC

## 2014-10-21 MED ORDER — SODIUM CHLORIDE 0.9 % IV BOLUS (SEPSIS)
500.0000 mL | Freq: Once | INTRAVENOUS | Status: AC
  Administered 2014-10-21: 500 mL via INTRAVENOUS

## 2014-10-21 MED ORDER — DEXTROSE 5 % IV SOLN: 500.0000 mg | Freq: Once | INTRAVENOUS | Status: DC

## 2014-10-21 MED ORDER — IOHEXOL 350 MG/ML SOLN
100.0000 mL | Freq: Once | INTRAVENOUS | Status: AC | PRN
  Administered 2014-10-21: 100 mL via INTRAVENOUS

## 2014-10-21 MED ORDER — DEXTROSE 5 % IV SOLN
1.0000 g | Freq: Three times a day (TID) | INTRAVENOUS | Status: DC
  Administered 2014-10-22 – 2014-10-25 (×10): 1 g via INTRAVENOUS
  Filled 2014-10-21 (×13): qty 1

## 2014-10-21 MED ORDER — VANCOMYCIN HCL IN DEXTROSE 1-5 GM/200ML-% IV SOLN
1000.0000 mg | Freq: Two times a day (BID) | INTRAVENOUS | Status: DC
  Administered 2014-10-22 – 2014-10-25 (×8): 1000 mg via INTRAVENOUS
  Filled 2014-10-21 (×10): qty 200

## 2014-10-21 MED ORDER — ONDANSETRON HCL 4 MG/2ML IJ SOLN
4.0000 mg | Freq: Once | INTRAMUSCULAR | Status: AC
  Administered 2014-10-21: 4 mg via INTRAVENOUS
  Filled 2014-10-21: qty 2

## 2014-10-21 MED ORDER — ALBUTEROL SULFATE (2.5 MG/3ML) 0.083% IN NEBU
2.5000 mg | INHALATION_SOLUTION | Freq: Once | RESPIRATORY_TRACT | Status: AC
  Administered 2014-10-21: 2.5 mg via RESPIRATORY_TRACT
  Filled 2014-10-21: qty 3

## 2014-10-21 MED ORDER — IPRATROPIUM-ALBUTEROL 0.5-2.5 (3) MG/3ML IN SOLN
3.0000 mL | Freq: Once | RESPIRATORY_TRACT | Status: AC
  Administered 2014-10-21: 3 mL via RESPIRATORY_TRACT
  Filled 2014-10-21: qty 3

## 2014-10-21 MED ORDER — SODIUM CHLORIDE 0.9 % IJ SOLN
INTRAMUSCULAR | Status: AC
  Administered 2014-10-23: 3 mL via INTRAVENOUS
  Filled 2014-10-21: qty 250

## 2014-10-21 MED ORDER — DEXTROSE 5 % IV SOLN
2.0000 g | Freq: Once | INTRAVENOUS | Status: AC
  Administered 2014-10-21: 2 g via INTRAVENOUS
  Filled 2014-10-21: qty 2

## 2014-10-21 MED ORDER — METHYLPREDNISOLONE SODIUM SUCC 125 MG IJ SOLR
125.0000 mg | Freq: Once | INTRAMUSCULAR | Status: AC
  Administered 2014-10-21: 125 mg via INTRAVENOUS
  Filled 2014-10-21: qty 2

## 2014-10-21 MED ORDER — MORPHINE SULFATE 4 MG/ML IJ SOLN
4.0000 mg | Freq: Once | INTRAMUSCULAR | Status: AC
  Administered 2014-10-21: 4 mg via INTRAVENOUS
  Filled 2014-10-21: qty 1

## 2014-10-21 MED ORDER — ONDANSETRON HCL 4 MG/2ML IJ SOLN
4.0000 mg | Freq: Once | INTRAMUSCULAR | Status: AC
  Administered 2014-10-21: 4 mg via INTRAMUSCULAR
  Filled 2014-10-21: qty 2

## 2014-10-21 NOTE — ED Notes (Signed)
Pt reports today blew her nose and saw dried dark red blood and coughed up some blood.  Also reports chest pain and back pain.

## 2014-10-21 NOTE — ED Provider Notes (Addendum)
CSN: [id number]     Arrival date & time 10/21/14  1739 History  This chart was scribed for Donnetta Hutching, MD by Evon Slack, ED Scribe. This patient was seen in room APA01/APA01 and the patient's care was started at 5:45 PM.    Chief Complaint  Patient presents with  . Cough   The history is provided by the patient. No language interpreter was used.   HPI Comments: Kim [name] is a 48 y.o. female with PMHx of COPD, PE, pneumonia, diabetes, A-fib and flutter, and cardiomyopathy who presents to the Emergency Department complaining of cough onset this morning c associated hemoptysis x2 today, approximately a teaspoon in volume each episode. She states she is also having some associated mild chest pain, SOB and wheezing. She  is on 2L of oxygen at home. She is no longer smoking, but recently smoked last night due to stressors in her life. She has a Hx of PE 3-4 years ago.     Past Medical History  Diagnosis Date  . COPD (chronic obstructive pulmonary disease)     Home O2 3L  . Coronary atherosclerosis of native coronary artery     Cardiac catheterization 07/2012 - LAD 40%; small D2 with 60 to 70% ostial; OM1 30 to 40%; RCA 20%; PL1 40%   . Hypothyroidism   . Hepatitis C   . Pneumonia   . Cirrhosis   . Type 2 diabetes mellitus   . GERD (gastroesophageal reflux disease)   . Headache(784.0)   . Anxiety   . Atrial fibrillation and flutter   . Chronic pain   . PE (pulmonary embolism)   . Chronic anticoagulation   . History of medication noncompliance   . SSS (sick sinus syndrome)     Biotronik pacemaker  . Cardiomyopathy     LVEF 40-45%   Past Surgical History  Procedure Laterality Date  . Cholecystectomy    . Pacemaker insertion  02/2012    Biotronik PPM   . Cardiac electrophysiology study and ablation    . Esophagogastroduodenoscopy  Jan 2010    Dr. Arlyce Dice: multiple gastric erosions, benign path  . Esophagogastroduodenoscopy N/A 01/10/2014    [id number] EG junction. Query  short segment Barrett's esophagus-status post pinch biopsy as described above. Hiatal hernia; otherwise negative EGD  . Insert / replace / remove pacemaker     Family History  Problem Relation Age of Onset  . Coronary artery disease Father 20  . Coronary artery disease Mother 59  . Coronary artery disease Brother   . Colon cancer Neg Hx    History  Substance Use Topics  . Smoking status: Current Every Day Smoker -- 0.50 packs/day for 30 years    Types: Cigarettes  . Smokeless tobacco: Current User  . Alcohol Use: No   OB History    No data available     Review of Systems A complete 10 system review of systems was obtained and all systems are negative except as noted in the HPI and PMH.    Allergies  Nitroglycerin; Doxycycline; Flexeril; Ibuprofen; Tramadol; Tylenol; Vesicare; Aspirin; Hydralazine; and Amoxil  Home Medications   Prior to Admission medications   Medication Sig Start Date End Date Taking? Authorizing Provider  albuterol (PROVENTIL HFA;VENTOLIN HFA) 108 (90 BASE) MCG/ACT inhaler Inhale 2 puffs into the lungs every 6 (six) hours as needed for wheezing or shortness of breath.     Historical Provider, MD  amLODipine (NORVASC) 2.5 MG tablet Take 1 tablet (2.5 mg total)  by mouth daily. 07/01/14   Standley Brooking, MD  aspirin 325 MG tablet Take 1 tablet (325 mg total) by mouth daily. 09/26/14   Gwenyth Bender, NP  budesonide-formoterol (SYMBICORT) 80-4.5 MCG/ACT inhaler Inhale 2 puffs into the lungs 2 (two) times daily.    Historical Provider, MD  furosemide (LASIX) 80 MG tablet Take 1 tablet (80 mg total) by mouth daily. 08/24/14   Gwenyth Bender, NP  ipratropium-albuterol (DUONEB) 0.5-2.5 (3) MG/3ML SOLN Take 3 mLs by nebulization 3 (three) times daily.    Historical Provider, MD  levofloxacin (LEVAQUIN) 250 MG tablet Take 1 tablet (250 mg total) by mouth daily. 09/26/14   Gwenyth Bender, NP  LORazepam (ATIVAN) 1 MG tablet Take 0.5 tablets (0.5 mg total) by mouth 3  (three) times daily as needed for anxiety. 10/19/14   Raeford Razor, MD  magnesium oxide (MAG-OX) 400 (241.3 MG) MG tablet Take 1 tablet (400 mg total) by mouth daily. 09/26/14   Gwenyth Bender, NP  metFORMIN (GLUCOPHAGE) 500 MG tablet Take 1 tablet (500 mg total) by mouth at bedtime. 08/24/14   Gwenyth Bender, NP  metoprolol succinate (TOPROL-XL) 25 MG 24 hr tablet Take 1 tablet (25 mg total) by mouth daily. 06/14/14   Gwenyth Bender, NP  naproxen (NAPROSYN) 375 MG tablet Take 1 tablet (375 mg total) by mouth 2 (two) times daily as needed for mild pain or moderate pain. 09/26/14   Gwenyth Bender, NP  omeprazole (PRILOSEC) 40 MG capsule Take 1 capsule (40 mg total) by mouth daily. 09/04/14   Carlisle Beers Molpus, MD  potassium chloride 20 MEQ TBCR Take 20 mEq by mouth daily. Take with furosemide (Lasix) 08/24/14   Elliot Cousin, MD  predniSONE (DELTASONE) 20 MG tablet Take 1 tablet (20 mg total) by mouth 2 (two) times daily with a meal. 09/26/14   Gwenyth Bender, NP  Pseudoeph-Doxylamine-DM-APAP (NYQUIL PO) Take 2 capsules by mouth once as needed (for congestion).    Historical Provider, MD  QUEtiapine (SEROQUEL) 25 MG tablet Take 1 tablet (25 mg total) by mouth at bedtime. 08/24/14   Gwenyth Bender, NP  warfarin (COUMADIN) 4 MG tablet Take one tablet a day 10/07/14   Benny Lennert, MD   Triage Vitals: BP 151/83 mmHg  Pulse 80  Temp(Src) 97.4 F (36.3 C) (Oral)  Resp 19  Ht 5\' 9"  (1.753 m)  Wt 235 lb (106.595 kg)  BMI 34.69 kg/m2  SpO2 95%  LMP 02/08/2007  Physical Exam  Constitutional: She is oriented to person, place, and time. She appears well-developed and well-nourished.  Obese, does not appear to in acute distress   HENT:  Head: Normocephalic and atraumatic.  Eyes: Conjunctivae and EOM are normal. Pupils are equal, round, and reactive to light.  Neck: Normal range of motion. Neck supple.  Cardiovascular: Normal rate, regular rhythm and normal heart sounds.   Pulmonary/Chest: Effort normal. She has  wheezes.  Minimal wheezing.   Abdominal: Soft. Bowel sounds are normal.  Musculoskeletal: Normal range of motion.  Neurological: She is alert and oriented to person, place, and time.  Skin: Skin is warm and dry.  Psychiatric: She has a normal mood and affect. Her behavior is normal.  Nursing note and vitals reviewed.   ED Course  Procedures (including critical care time) DIAGNOSTIC STUDIES: Oxygen Saturation is 95% on [place], adequate by my interpretation.    COORDINATION OF CARE: 6:02 PM-Discussed treatment plan which includes breathing treatment, pain medication and  basic lab work with pt at bedside and pt agreed to plan.     Labs Review Labs Reviewed  BASIC METABOLIC PANEL - Abnormal; Notable for the following:    GFR calc non Af Amer 81 (*)    All other components within normal limits  CBC WITH DIFFERENTIAL - Abnormal; Notable for the following:    RBC 3.65 (*)    MCV 101.6 (*)    All other components within normal limits  TROPONIN I - Abnormal; Notable for the following:    Troponin I 0.54 (*)    All other components within normal limits    Imaging Review Dg Chest 2 View  10/21/2014   CLINICAL DATA:  Cough since this morning. Initial encounter. Short of breath and wheezing. Hemoptysis.  EXAM: CHEST  2 VIEW  COMPARISON:  10/19/2014.  FINDINGS: The cardiopericardial silhouette remains enlarged. Dual lead LEFT subclavian cardiac pacemaker. Bilateral basilar atelectasis. LEFT atrial enlargement noted on the lateral view. No focal airspace consolidation. There is pulmonary vascular congestion which is similar to the prior examination.  IMPRESSION: Cardiomegaly and pulmonary vascular congestion. Basilar atelectasis. No change from prior.   Electronically Signed   By: Andreas NewportGeoffrey  Lamke M.D.   On: 10/21/2014 18:41   Ct Angio Chest Pe W/cm &/or Wo Cm  10/21/2014   CLINICAL DATA:  Hemoptysis. Chest pain and pressure. Weakness and shortness of breath. ICD10: R 0 4.2. History of pacer.  COPD. Hepatitis-C.  EXAM: CT ANGIOGRAPHY CHEST WITH CONTRAST  TECHNIQUE: Multidetector CT imaging of the chest was performed using the standard protocol during bolus administration of intravenous contrast. Multiplanar CT image reconstructions and MIPs were obtained to evaluate the vascular anatomy.  CONTRAST:  100mL OMNIPAQUE IOHEXOL 350 MG/ML SOLN  COMPARISON:  Plain films 10/21/2014.  Chest CT 03/10/2014  FINDINGS: Lungs/Pleura: Probable secretions within the non dependent trachea. Mild degradation secondary to patient size and motion. Right middle lobe 5 mm nodule on image this 72 of series 5 is likely similar to the prior exam. Patchy right lower lobe airspace disease with minimal right lower lobe nodularity. Mild left base subsegmental atelectasis. Scattered tiny pulmonary nodules are again identified.  Small right pleural effusion which is increased since the prior CT.  Heart/Mediastinum: The quality of this examination for evaluation of pulmonary embolism is good. The bolus is well timed. Minor limitations detailed above. No evidence of pulmonary embolism.  Increased number of small low jugular nodes, suboptimally evaluated. Moderate cardiomegaly with prior mitral valve repair. Pacer. No pericardial effusion. Pulmonary artery enlargement, with the outflow tract measuring 3.9 cm.  Increased number of small mediastinal nodes. A pretracheal node measures 1.2 cm on image 35 and is unchanged. No hilar adenopathy.  Upper Abdomen: Mild reflux of contrast into the hepatic veins and IVC. Prominent caudate lobe with irregular hepatic capsule. Normal imaged portions of the spleen, stomach, pancreas, adrenal glands, kidneys. Prominent porta hepatis nodes, including a gastrohepatic ligament node of 1.4 cm on image 103. This is also similar.  Bones/Musculoskeletal:  No acute osseous abnormality.  Review of the MIP images confirms the above findings.  IMPRESSION: 1.  No evidence of pulmonary embolism. 2. Increased small  right pleural effusion with patchy right lower lobe airspace disease. Although this could represent atelectasis, infection is favored. 3. Scattered pulmonary nodules, including a 5 mm right middle lobe nodule. Given history of COPD, followup and April of 2016 is indicated to confirm stability. This recommendation follows the consensus statement: Guidelines for Management of Small Pulmonary Nodules  Detected on CT Scans: A Statement from the Fleischner Society as published in Radiology 2005; 237:395-400. 4. Mild thoracic and upper abdominal adenopathy. Recommend attention on follow-up. 5. Suspicion of cirrhosis. Upper abdominal adenopathy is likely secondary and reactive. 6. Pulmonary artery enlargement suggests pulmonary arterial hypertension. Elevated right heart pressures as evidenced by reflux of contrast into the IVC and hepatic veins.   Electronically Signed   By: Jeronimo Greaves M.D.   On: 10/21/2014 21:46     EKG Interpretation   Date/Time:  Sunday October 21 2014 17:47:58 EST Ventricular Rate:  89 PR Interval:  42 QRS Duration: 181 QT Interval:  459 QTC Calculation: 559 R Axis:   -81 Text Interpretation:  Sinus rhythm Short PR interval Right bundle branch  block LVH with IVCD and secondary repol abnrm Prolonged QT interval  Baseline wander in lead(s) II III aVL aVF V5 Confirmed by Mckynlie Vanderslice  MD, Dmauri Rosenow  [phone]) on 10/21/2014 6:48:08 PM      MDM   Final diagnoses:  Hemoptysis  Community acquired pneumonia   Second visit to the emergency department in 2 days.   Patient now complains of a hemoptysis. CT scan shows no pulmonary embolism. However, scan reveals patchy right lower lobe airspace disease. IV Vancomycin, IV Maxipime, IV Solu-Medrol. Nebulizer treatment with albuterol/Atrovent. Admit.     I personally performed the services described in this documentation, which was scribed in my presence. The recorded information has been reviewed and is accurate.      Donnetta Hutching,  MD 10/21/14 [id number]  Donnetta Hutching, MD 10/21/14 [id number]

## 2014-10-21 NOTE — Progress Notes (Signed)
ANTIBIOTIC CONSULT NOTE - INITIAL  Pharmacy Consult for vancomycin & cefepime Indication:  HCAP  Allergies  Allergen Reactions  . Nitroglycerin Other (See Comments)    CAUSED NUMBNESS ALL OVER  . Doxycycline Itching  . Flexeril [Cyclobenzaprine] Other (See Comments)    Sweating, Lightheaded   . Ibuprofen Other (See Comments)    Increase BP, dizziness, lightheaded  . Tramadol Itching  . Tylenol [Acetaminophen] Other (See Comments)    Pt has Hep C  . Vesicare [Solifenacin] Other (See Comments)    Makes my sweat turn yellow  . Aspirin Other (See Comments)    Cannot take due to liver function  . Hydralazine     Altered mental status, hallucinations, angression  . Amoxil [Amoxicillin] Nausea Only    Patient Measurements: Height: 5\' 9"  (175.3 cm) Weight: 235 lb (106.595 kg) IBW/kg (Calculated) : 66.2 Adjusted Body Weight: 80 kg  Vital Signs: Temp: 97.4 F (36.3 C) (11/15 1750) Temp Source: Oral (11/15 1750) BP: 146/96 mmHg (11/15 2132) Pulse Rate: 82 (11/15 2132) Intake/Output from previous day:   Intake/Output from this shift:    Labs:  Recent Labs  10/19/14 2114 10/21/14 1850  WBC 7.2 9.7  HGB 12.1 12.0  PLT 223 229  CREATININE 0.85 0.83   Estimated Creatinine Clearance: 106.7 mL/min (by C-G formula based on Cr of 0.83).  With chronic medical problems, assume CrCl is actually less at approx 75-8390ml/min   Microbiology: Recent Results (from the past 720 hour(s))  Blood culture (routine x 2)     Status: None   Collection Time: 09/23/14 10:39 PM  Result Value Ref Range Status   Specimen Description BLOOD RIGHT ARM  Final   Special Requests BOTTLES DRAWN AEROBIC AND ANAEROBIC 8CC  Final   Culture NO GROWTH 6 DAYS  Final   Report Status 09/29/2014 FINAL  Final  Blood culture (routine x 2)     Status: None   Collection Time: 09/23/14 10:44 PM  Result Value Ref Range Status   Specimen Description BLOOD RIGHT ANTECUBITAL  Final   Special Requests BOTTLES DRAWN  AEROBIC AND ANAEROBIC 9CC  Final   Culture NO GROWTH 6 DAYS  Final   Report Status 09/29/2014 FINAL  Final  MRSA PCR Screening     Status: Abnormal   Collection Time: 09/24/14  1:15 PM  Result Value Ref Range Status   MRSA by PCR POSITIVE (A) NEGATIVE Final    Comment:        The GeneXpert MRSA Assay (FDA approved for NASAL specimens only), is one component of a comprehensive MRSA colonization surveillance program. It is not intended to diagnose MRSA infection nor to guide or monitor treatment for MRSA infections. RESULT CALLED TO, READ BACK BY AND VERIFIED WITH: SEIGLAR,J. AT 1748 ON 09/24/2014 BY Ginette PitmanBAUGHAM,M.    Medical History: Past Medical History  Diagnosis Date  . COPD (chronic obstructive pulmonary disease)     Home O2 3L  . Coronary atherosclerosis of native coronary artery     Cardiac catheterization 07/2012 - LAD 40%; small D2 with 60 to 70% ostial; OM1 30 to 40%; RCA 20%; PL1 40%   . Hypothyroidism   . Hepatitis C   . Pneumonia   . Cirrhosis   . Type 2 diabetes mellitus   . GERD (gastroesophageal reflux disease)   . Headache(784.0)   . Anxiety   . Atrial fibrillation and flutter   . Chronic pain   . PE (pulmonary embolism)   . Chronic anticoagulation   .  History of medication noncompliance   . SSS (sick sinus syndrome)     Biotronik pacemaker  . Cardiomyopathy     LVEF 40-45%    Medications:  Scheduled:  . sodium chloride       Infusions:  . [START ON 10/22/2014] ceFEPime (MAXIPIME) IV    . ceFEPime (MAXIPIME) IV    . [START ON 10/22/2014] vancomycin     PRN:   Assessment: 62yr female with complicated medical hx.  Presently treating R/O HCAP with cefepime and vancomycin.  Goal of Therapy:  With estimated CrCl 75-44ml/min ( no need to adjust for liver impairment if not 3rd spacing intravascular fluids), will start standard regimen for cefepime and adjust vancomycin for trough serum level 15-68mcg/ml  Plan:  1.  Vancomycin 1gm IV q12h 2.   Cefepime 2gm IV x 1 then 1gm IV q8h 3.  Monitor for indices of infection, for volume of distribution effects from cirrhosis, and renal function 4.  Will check steady state vancomycin trough level as clinically appropriate  Tami Barren, Lloyd Huger E 10/21/2014,10:50 PM

## 2014-10-21 NOTE — ED Notes (Signed)
CRITICAL VALUE ALERT  Critical value received:  Troponin 0.54  Date of notification:  10/21/14  Time of notification:  1919  Critical value read back:Yes.    Nurse who received alert:  Kathlene Cote, RN  MD notified (1st page):  Dr. Adriana Simas  Time of first page:  1920

## 2014-10-22 DIAGNOSIS — Z7952 Long term (current) use of systemic steroids: Secondary | ICD-10-CM | POA: Diagnosis not present

## 2014-10-22 DIAGNOSIS — I4891 Unspecified atrial fibrillation: Secondary | ICD-10-CM | POA: Diagnosis present

## 2014-10-22 DIAGNOSIS — I10 Essential (primary) hypertension: Secondary | ICD-10-CM | POA: Diagnosis present

## 2014-10-22 DIAGNOSIS — E119 Type 2 diabetes mellitus without complications: Secondary | ICD-10-CM | POA: Diagnosis present

## 2014-10-22 DIAGNOSIS — E039 Hypothyroidism, unspecified: Secondary | ICD-10-CM | POA: Diagnosis present

## 2014-10-22 DIAGNOSIS — F17210 Nicotine dependence, cigarettes, uncomplicated: Secondary | ICD-10-CM | POA: Diagnosis present

## 2014-10-22 DIAGNOSIS — I255 Ischemic cardiomyopathy: Secondary | ICD-10-CM | POA: Diagnosis present

## 2014-10-22 DIAGNOSIS — J9610 Chronic respiratory failure, unspecified whether with hypoxia or hypercapnia: Secondary | ICD-10-CM | POA: Diagnosis present

## 2014-10-22 DIAGNOSIS — E876 Hypokalemia: Secondary | ICD-10-CM | POA: Diagnosis present

## 2014-10-22 DIAGNOSIS — Z8249 Family history of ischemic heart disease and other diseases of the circulatory system: Secondary | ICD-10-CM | POA: Diagnosis not present

## 2014-10-22 DIAGNOSIS — J440 Chronic obstructive pulmonary disease with acute lower respiratory infection: Secondary | ICD-10-CM | POA: Diagnosis present

## 2014-10-22 DIAGNOSIS — Z7289 Other problems related to lifestyle: Secondary | ICD-10-CM | POA: Diagnosis not present

## 2014-10-22 DIAGNOSIS — Z950 Presence of cardiac pacemaker: Secondary | ICD-10-CM | POA: Diagnosis not present

## 2014-10-22 DIAGNOSIS — R05 Cough: Secondary | ICD-10-CM | POA: Diagnosis not present

## 2014-10-22 DIAGNOSIS — Z7982 Long term (current) use of aspirin: Secondary | ICD-10-CM | POA: Diagnosis not present

## 2014-10-22 DIAGNOSIS — F418 Other specified anxiety disorders: Secondary | ICD-10-CM

## 2014-10-22 DIAGNOSIS — R042 Hemoptysis: Secondary | ICD-10-CM | POA: Insufficient documentation

## 2014-10-22 DIAGNOSIS — Z792 Long term (current) use of antibiotics: Secondary | ICD-10-CM | POA: Diagnosis not present

## 2014-10-22 DIAGNOSIS — Z9981 Dependence on supplemental oxygen: Secondary | ICD-10-CM | POA: Diagnosis not present

## 2014-10-22 DIAGNOSIS — F1910 Other psychoactive substance abuse, uncomplicated: Secondary | ICD-10-CM

## 2014-10-22 DIAGNOSIS — K219 Gastro-esophageal reflux disease without esophagitis: Secondary | ICD-10-CM | POA: Diagnosis present

## 2014-10-22 DIAGNOSIS — I5032 Chronic diastolic (congestive) heart failure: Secondary | ICD-10-CM | POA: Diagnosis present

## 2014-10-22 DIAGNOSIS — B1920 Unspecified viral hepatitis C without hepatic coma: Secondary | ICD-10-CM | POA: Diagnosis present

## 2014-10-22 DIAGNOSIS — Z86711 Personal history of pulmonary embolism: Secondary | ICD-10-CM | POA: Diagnosis not present

## 2014-10-22 DIAGNOSIS — Z7901 Long term (current) use of anticoagulants: Secondary | ICD-10-CM | POA: Diagnosis not present

## 2014-10-22 DIAGNOSIS — I2510 Atherosclerotic heart disease of native coronary artery without angina pectoris: Secondary | ICD-10-CM | POA: Diagnosis present

## 2014-10-22 DIAGNOSIS — J189 Pneumonia, unspecified organism: Secondary | ICD-10-CM | POA: Diagnosis present

## 2014-10-22 DIAGNOSIS — K7460 Unspecified cirrhosis of liver: Secondary | ICD-10-CM | POA: Diagnosis present

## 2014-10-22 DIAGNOSIS — J9611 Chronic respiratory failure with hypoxia: Secondary | ICD-10-CM

## 2014-10-22 DIAGNOSIS — J441 Chronic obstructive pulmonary disease with (acute) exacerbation: Secondary | ICD-10-CM | POA: Diagnosis present

## 2014-10-22 DIAGNOSIS — Z9114 Patient's other noncompliance with medication regimen: Secondary | ICD-10-CM | POA: Diagnosis present

## 2014-10-22 DIAGNOSIS — R059 Cough, unspecified: Secondary | ICD-10-CM | POA: Insufficient documentation

## 2014-10-22 DIAGNOSIS — Y95 Nosocomial condition: Secondary | ICD-10-CM | POA: Diagnosis present

## 2014-10-22 DIAGNOSIS — G894 Chronic pain syndrome: Secondary | ICD-10-CM | POA: Diagnosis present

## 2014-10-22 LAB — BASIC METABOLIC PANEL
ANION GAP: 8 (ref 5–15)
BUN: 11 mg/dL (ref 6–23)
CALCIUM: 8.5 mg/dL (ref 8.4–10.5)
CO2: 31 mEq/L (ref 19–32)
Chloride: 100 mEq/L (ref 96–112)
Creatinine, Ser: 0.83 mg/dL (ref 0.50–1.10)
GFR, EST NON AFRICAN AMERICAN: 81 mL/min — AB (ref >90)
GLUCOSE: 150 mg/dL — AB (ref 70–99)
Potassium: 4.8 mEq/L (ref 3.7–5.3)
SODIUM: 139 meq/L (ref 137–147)

## 2014-10-22 LAB — CBC WITH DIFFERENTIAL/PLATELET
BASOS ABS: 0 10*3/uL (ref 0.0–0.1)
Basophils Relative: 0 % (ref 0–1)
EOS ABS: 0 10*3/uL (ref 0.0–0.7)
EOS PCT: 0 % (ref 0–5)
HCT: 38.6 % (ref 36.0–46.0)
Hemoglobin: 12.4 g/dL (ref 12.0–15.0)
LYMPHS ABS: 0.7 10*3/uL (ref 0.7–4.0)
Lymphocytes Relative: 8 % — ABNORMAL LOW (ref 12–46)
MCH: 32.9 pg (ref 26.0–34.0)
MCHC: 32.1 g/dL (ref 30.0–36.0)
MCV: 102.4 fL — ABNORMAL HIGH (ref 78.0–100.0)
Monocytes Absolute: 0.4 10*3/uL (ref 0.1–1.0)
Monocytes Relative: 5 % (ref 3–12)
Neutro Abs: 6.8 10*3/uL (ref 1.7–7.7)
Neutrophils Relative %: 87 % — ABNORMAL HIGH (ref 43–77)
PLATELETS: 199 10*3/uL (ref 150–400)
RBC: 3.77 MIL/uL — AB (ref 3.87–5.11)
RDW: 14.9 % (ref 11.5–15.5)
WBC: 7.8 10*3/uL (ref 4.0–10.5)

## 2014-10-22 LAB — TROPONIN I: Troponin I: 0.36 ng/mL (ref <0.30)

## 2014-10-22 LAB — GLUCOSE, CAPILLARY: GLUCOSE-CAPILLARY: 161 mg/dL — AB (ref 70–99)

## 2014-10-22 MED ORDER — FUROSEMIDE 80 MG PO TABS
80.0000 mg | ORAL_TABLET | Freq: Every day | ORAL | Status: DC
  Administered 2014-10-22 – 2014-10-25 (×4): 80 mg via ORAL
  Filled 2014-10-22 (×4): qty 1

## 2014-10-22 MED ORDER — SODIUM CHLORIDE 0.9 % IJ SOLN: 3.0000 mL | INTRAMUSCULAR | Status: DC | PRN

## 2014-10-22 MED ORDER — IPRATROPIUM-ALBUTEROL 0.5-2.5 (3) MG/3ML IN SOLN
3.0000 mL | Freq: Three times a day (TID) | RESPIRATORY_TRACT | Status: DC
  Administered 2014-10-22 – 2014-10-23 (×4): 3 mL via RESPIRATORY_TRACT
  Filled 2014-10-22 (×6): qty 3

## 2014-10-22 MED ORDER — ACETAMINOPHEN 500 MG PO TABS
500.0000 mg | ORAL_TABLET | Freq: Once | ORAL | Status: AC
  Administered 2014-10-22: 500 mg via ORAL
  Filled 2014-10-22: qty 1

## 2014-10-22 MED ORDER — DIPHENHYDRAMINE HCL 12.5 MG/5ML PO ELIX
12.5000 mg | ORAL_SOLUTION | Freq: Once | ORAL | Status: AC
  Administered 2014-10-22: 12.5 mg via ORAL
  Filled 2014-10-22 (×2): qty 5

## 2014-10-22 MED ORDER — VANCOMYCIN HCL IN DEXTROSE 1-5 GM/200ML-% IV SOLN
INTRAVENOUS | Status: AC
  Filled 2014-10-22: qty 200

## 2014-10-22 MED ORDER — AMLODIPINE BESYLATE 5 MG PO TABS
2.5000 mg | ORAL_TABLET | Freq: Every day | ORAL | Status: DC
Start: 2014-10-22 — End: 2014-10-25
  Administered 2014-10-22 – 2014-10-25 (×4): 2.5 mg via ORAL
  Filled 2014-10-22 (×4): qty 1

## 2014-10-22 MED ORDER — SODIUM CHLORIDE 0.9 % IJ SOLN
3.0000 mL | Freq: Two times a day (BID) | INTRAMUSCULAR | Status: DC
  Administered 2014-10-22 – 2014-10-24 (×5): 3 mL via INTRAVENOUS

## 2014-10-22 MED ORDER — CETYLPYRIDINIUM CHLORIDE 0.05 % MT LIQD
7.0000 mL | Freq: Two times a day (BID) | OROMUCOSAL | Status: DC
  Administered 2014-10-22 – 2014-10-25 (×6): 7 mL via OROMUCOSAL

## 2014-10-22 MED ORDER — SODIUM CHLORIDE 0.9 % IV SOLN: 250.0000 mL | INTRAVENOUS | Status: DC | PRN

## 2014-10-22 MED ORDER — MOMETASONE FURO-FORMOTEROL FUM 100-5 MCG/ACT IN AERO
2.0000 | INHALATION_SPRAY | Freq: Two times a day (BID) | RESPIRATORY_TRACT | Status: DC
  Administered 2014-10-22 – 2014-10-25 (×6): 2 via RESPIRATORY_TRACT
  Filled 2014-10-22 (×2): qty 8.8

## 2014-10-22 NOTE — Progress Notes (Signed)
UR completed 

## 2014-10-22 NOTE — Consult Note (Signed)
Consult requested VO:HYWVP hospitalists Consult requested forhemoptysis:  XTG:GYIR is a 48 year old who has multiple medical problems including COPD requiring home oxygen, hepatitis C, cirrhosis, diabetes, atrial flutter and fibrillation, and chronic anticoagulation with cardiomyopathy. She has had multiple hospitalizations for various causes over the last several months. She came to the emergency department with hemoptysis and CT scan shows what looks like pneumonia. She says she's still coughing up blood. There is some sputum mixed with the blood.  Past Medical History  Diagnosis Date  . COPD (chronic obstructive pulmonary disease)     Home O2 3L  . Coronary atherosclerosis of native coronary artery     Cardiac catheterization 07/2012 - LAD 40%; small D2 with 60 to 70% ostial; OM1 30 to 40%; RCA 20%; PL1 40%   . Hypothyroidism   . Hepatitis C   . Pneumonia   . Cirrhosis   . Type 2 diabetes mellitus   . GERD (gastroesophageal reflux disease)   . Headache(784.0)   . Anxiety   . Atrial fibrillation and flutter   . Chronic pain   . PE (pulmonary embolism)   . Chronic anticoagulation   . History of medication noncompliance   . SSS (sick sinus syndrome)     Biotronik pacemaker  . Cardiomyopathy     LVEF 40-45%     Family History  Problem Relation Age of Onset  . Coronary artery disease Father 60  . Coronary artery disease Mother 34  . Coronary artery disease Brother   . Colon cancer Neg Hx      History   Social History  . Marital Status: Single    Spouse Name: N/A    Number of Children: N/A  . Years of Education: N/A   Social History Main Topics  . Smoking status: Current Every Day Smoker -- 0.50 packs/day for 30 years    Types: Cigarettes  . Smokeless tobacco: Current User  . Alcohol Use: No  . Drug Use: No  . Sexual Activity: No   Other Topics Concern  . None   Social History Narrative   Contact information: [phone] (cell), [phone] (landline)      ROS: she's had some chest discomfort with cough. She's not had fever or chills. She is very weak. She denies any abdominal swelling. No headache no nausea or vomiting no blurred vision otherwise per the history and physical    Objective: Vital signs in last 24 hours: Temp:  [97.4 F (36.3 C)-98.2 F (36.8 C)] 97.5 F (36.4 C) (11/16 0553) Pulse Rate:  [72-86] 72 (11/16 0553) Resp:  [14-36] 14 (11/16 0553) BP: (114-151)/(72-96) 134/80 mmHg (11/16 0553) SpO2:  [92 %-100 %] 92 % (11/16 0753) Weight:  [106.595 kg (235 lb)-108.092 kg (238 lb 4.8 oz)] 108.092 kg (238 lb 4.8 oz) (11/16 0016) Weight change:  Last BM Date: 10/21/14  Intake/Output from previous day: 11/15 0701 - 11/16 0700 In: 200 [IV Piggyback:200] Out: -   PHYSICAL EXAM she is awake and alert. She is obese. Her pupils react nose and throat are clear mucous membranes are moist her neck is supple. Her chest shows some rhonchi bilaterally. Her heart now is regular. Her abdomen is soft and feel any masses extremities showed no edema. Central nervous system examination is grossly intact  Lab Results: Basic Metabolic Panel:  Recent Labs  00/93/81 1850 10/22/14 0619  NA 143 139  K 4.2 4.8  CL 102 100  CO2 31 31  GLUCOSE 88 150*  BUN 12 11  CREATININE  0.83 0.83  CALCIUM 8.7 8.5   Liver Function Tests: No results for input(s): AST, ALT, ALKPHOS, BILITOT, PROT, ALBUMIN in the last 72 hours. No results for input(s): LIPASE, AMYLASE in the last 72 hours. No results for input(s): AMMONIA in the last 72 hours. CBC:  Recent Labs  10/21/14 1850 10/22/14 0619  WBC 9.7 7.8  NEUTROABS 6.4 6.8  HGB 12.0 12.4  HCT 37.1 38.6  MCV 101.6* 102.4*  PLT 229 199   Cardiac Enzymes:  Recent Labs  10/19/14 2114 10/21/14 1850  TROPONINI 0.53* 0.54*   BNP: No results for input(s): PROBNP in the last 72 hours. D-Dimer: No results for input(s): DDIMER in the last 72 hours. CBG: No results for input(s): GLUCAP in the  last 72 hours. Hemoglobin A1C: No results for input(s): HGBA1C in the last 72 hours. Fasting Lipid Panel: No results for input(s): CHOL, HDL, LDLCALC, TRIG, CHOLHDL, LDLDIRECT in the last 72 hours. Thyroid Function Tests: No results for input(s): TSH, T4TOTAL, FREET4, T3FREE, THYROIDAB in the last 72 hours. Anemia Panel: No results for input(s): VITAMINB12, FOLATE, FERRITIN, TIBC, IRON, RETICCTPCT in the last 72 hours. Coagulation:  Recent Labs  10/21/14 2327  LABPROT 29.2*  INR 2.73*   Urine Drug Screen: Drugs of Abuse     Component Value Date/Time   LABOPIA POSITIVE* 06/13/2014 2212   COCAINSCRNUR NONE DETECTED 06/13/2014 2212   LABBENZ POSITIVE* 06/13/2014 2212   AMPHETMU NONE DETECTED 06/13/2014 2212   THCU POSITIVE* 06/13/2014 2212   LABBARB NONE DETECTED 06/13/2014 2212    Alcohol Level: No results for input(s): ETH in the last 72 hours. Urinalysis: No results for input(s): COLORURINE, LABSPEC, PHURINE, GLUCOSEU, HGBUR, BILIRUBINUR, KETONESUR, PROTEINUR, UROBILINOGEN, NITRITE, LEUKOCYTESUR in the last 72 hours.  Invalid input(s): APPERANCEUR Misc. Labs:   ABGS: No results for input(s): PHART, PO2ART, TCO2, HCO3 in the last 72 hours.  Invalid input(s): PCO2   MICROBIOLOGY: Recent Results (from the past 240 hour(s))  Culture, blood (routine x 2) Call MD if unable to obtain prior to antibiotics being given     Status: None (Preliminary result)   Collection Time: 10/22/14  1:20 AM  Result Value Ref Range Status   Specimen Description Blood BLOOD LEFT HAND  Final   Special Requests BOTTLES DRAWN AEROBIC AND ANAEROBIC 6CC  Final   Culture PENDING  Incomplete   Report Status PENDING  Incomplete  Culture, blood (routine x 2) Call MD if unable to obtain prior to antibiotics being given     Status: None (Preliminary result)   Collection Time: 10/22/14  1:35 AM  Result Value Ref Range Status   Specimen Description Blood BLOOD RIGHT HAND  Final   Special Requests  BOTTLES DRAWN AEROBIC AND ANAEROBIC [facility]  Final   Culture PENDING  Incomplete   Report Status PENDING  Incomplete    Studies/Results: Dg Chest 2 View  10/21/2014   CLINICAL DATA:  Cough since this morning. Initial encounter. Short of breath and wheezing. Hemoptysis.  EXAM: CHEST  2 VIEW  COMPARISON:  10/19/2014.  FINDINGS: The cardiopericardial silhouette remains enlarged. Dual lead LEFT subclavian cardiac pacemaker. Bilateral basilar atelectasis. LEFT atrial enlargement noted on the lateral view. No focal airspace consolidation. There is pulmonary vascular congestion which is similar to the prior examination.  IMPRESSION: Cardiomegaly and pulmonary vascular congestion. Basilar atelectasis. No change from prior.   Electronically Signed   By: Andreas Newport M.D.   On: 10/21/2014 18:41   Ct Angio Chest Pe W/cm &/or Wo Cm  10/21/2014   CLINICAL DATA:  Hemoptysis. Chest pain and pressure. Weakness and shortness of breath. ICD10: R 0 4.2. History of pacer. COPD. Hepatitis-C.  EXAM: CT ANGIOGRAPHY CHEST WITH CONTRAST  TECHNIQUE: Multidetector CT imaging of the chest was performed using the standard protocol during bolus administration of intravenous contrast. Multiplanar CT image reconstructions and MIPs were obtained to evaluate the vascular anatomy.  CONTRAST:  100mL OMNIPAQUE IOHEXOL 350 MG/ML SOLN  COMPARISON:  Plain films 10/21/2014.  Chest CT 03/10/2014  FINDINGS: Lungs/Pleura: Probable secretions within the non dependent trachea. Mild degradation secondary to patient size and motion. Right middle lobe 5 mm nodule on image this 72 of series 5 is likely similar to the prior exam. Patchy right lower lobe airspace disease with minimal right lower lobe nodularity. Mild left base subsegmental atelectasis. Scattered tiny pulmonary nodules are again identified.  Small right pleural effusion which is increased since the prior CT.  Heart/Mediastinum: The quality of this examination for evaluation of pulmonary  embolism is good. The bolus is well timed. Minor limitations detailed above. No evidence of pulmonary embolism.  Increased number of small low jugular nodes, suboptimally evaluated. Moderate cardiomegaly with prior mitral valve repair. Pacer. No pericardial effusion. Pulmonary artery enlargement, with the outflow tract measuring 3.9 cm.  Increased number of small mediastinal nodes. A pretracheal node measures 1.2 cm on image 35 and is unchanged. No hilar adenopathy.  Upper Abdomen: Mild reflux of contrast into the hepatic veins and IVC. Prominent caudate lobe with irregular hepatic capsule. Normal imaged portions of the spleen, stomach, pancreas, adrenal glands, kidneys. Prominent porta hepatis nodes, including a gastrohepatic ligament node of 1.4 cm on image 103. This is also similar.  Bones/Musculoskeletal:  No acute osseous abnormality.  Review of the MIP images confirms the above findings.  IMPRESSION: 1.  No evidence of pulmonary embolism. 2. Increased small right pleural effusion with patchy right lower lobe airspace disease. Although this could represent atelectasis, infection is favored. 3. Scattered pulmonary nodules, including a 5 mm right middle lobe nodule. Given history of COPD, followup and April of 2016 is indicated to confirm stability. This recommendation follows the consensus statement: Guidelines for Management of Small Pulmonary Nodules Detected on CT Scans: A Statement from the Fleischner Society as published in Radiology 2005; 237:395-400. 4. Mild thoracic and upper abdominal adenopathy. Recommend attention on follow-up. 5. Suspicion of cirrhosis. Upper abdominal adenopathy is likely secondary and reactive. 6. Pulmonary artery enlargement suggests pulmonary arterial hypertension. Elevated right heart pressures as evidenced by reflux of contrast into the IVC and hepatic veins.   Electronically Signed   By: Jeronimo GreavesKyle  Talbot M.D.   On: 10/21/2014 21:46    Medications:  Prior to Admission:   Prescriptions prior to admission  Medication Sig Dispense Refill Last Dose  . albuterol (PROVENTIL HFA;VENTOLIN HFA) 108 (90 BASE) MCG/ACT inhaler Inhale 2 puffs into the lungs every 6 (six) hours as needed for wheezing or shortness of breath.    10/21/2014 at Unknown time  . amLODipine (NORVASC) 2.5 MG tablet Take 1 tablet (2.5 mg total) by mouth daily. 30 tablet 0 10/21/2014 at Unknown time  . budesonide-formoterol (SYMBICORT) 80-4.5 MCG/ACT inhaler Inhale 2 puffs into the lungs 2 (two) times daily.   10/21/2014 at Unknown time  . furosemide (LASIX) 80 MG tablet Take 1 tablet (80 mg total) by mouth daily. 30 tablet 2 10/21/2014 at Unknown time  . ipratropium-albuterol (DUONEB) 0.5-2.5 (3) MG/3ML SOLN Take 3 mLs by nebulization 3 (three) times daily.  10/21/2014 at Unknown time  . magnesium oxide (MAG-OX) 400 (241.3 MG) MG tablet Take 1 tablet (400 mg total) by mouth daily. 30 tablet 0 10/20/2014 at Unknown time  . metFORMIN (GLUCOPHAGE) 500 MG tablet Take 1 tablet (500 mg total) by mouth at bedtime. 30 tablet 1 Past Week at Unknown time  . omeprazole (PRILOSEC) 40 MG capsule Take 1 capsule (40 mg total) by mouth daily. 30 capsule 0 10/20/2014 at Unknown time  . potassium chloride 20 MEQ TBCR Take 20 mEq by mouth daily. Take with furosemide (Lasix) 30 tablet 2 10/21/2014 at Unknown time  . warfarin (COUMADIN) 4 MG tablet Take one tablet a day 20 tablet 0 10/21/2014 at Unknown time  . aspirin 325 MG tablet Take 1 tablet (325 mg total) by mouth daily. (Patient not taking: Reported on 10/21/2014)     . levofloxacin (LEVAQUIN) 250 MG tablet Take 1 tablet (250 mg total) by mouth daily. (Patient not taking: Reported on 10/21/2014) 3 tablet 0 10/07/2014 at Unknown time  . LORazepam (ATIVAN) 1 MG tablet Take 0.5 tablets (0.5 mg total) by mouth 3 (three) times daily as needed for anxiety. 15 tablet 0 HAS NOT FILLED  . metoprolol succinate (TOPROL-XL) 25 MG 24 hr tablet Take 1 tablet (25 mg total) by mouth  daily. (Patient not taking: Reported on 10/21/2014) 25 tablet 0 more than a month  . naproxen (NAPROSYN) 375 MG tablet Take 1 tablet (375 mg total) by mouth 2 (two) times daily as needed for mild pain or moderate pain. (Patient not taking: Reported on 10/21/2014) 10 tablet 0 10/07/2014 at Unknown time  . predniSONE (DELTASONE) 20 MG tablet Take 1 tablet (20 mg total) by mouth 2 (two) times daily with a meal. (Patient not taking: Reported on 10/21/2014) 5 tablet 0 prescription not filled at Unknown time  . Pseudoeph-Doxylamine-DM-APAP (NYQUIL PO) Take 2 capsules by mouth once as needed (for congestion).   Past Week at Unknown time  . QUEtiapine (SEROQUEL) 25 MG tablet Take 1 tablet (25 mg total) by mouth at bedtime. (Patient not taking: Reported on 10/21/2014) 30 tablet 1 more than a  month   Scheduled: . amLODipine  2.5 mg Oral Daily  . antiseptic oral rinse  7 mL Mouth Rinse BID  . ceFEPime (MAXIPIME) IV  1 g Intravenous Q8H  . furosemide  80 mg Oral Daily  . ipratropium-albuterol  3 mL Nebulization TID  . mometasone-formoterol  2 puff Inhalation BID  . sodium chloride  3 mL Intravenous Q12H  . sodium chloride      . vancomycin  1,000 mg Intravenous Q12H   Continuous:  ZOX:WRUEAV chloride, sodium chloride  Assesment:she has healthcare associated pneumonia and hemoptysis from that. She is chronically anticoagulated. She has multiple other medical problems as noted  Principal Problem:   Healthcare-associated pneumonia Active Problems:   COPD (chronic obstructive pulmonary disease)   Chest pain   HCAP (healthcare-associated pneumonia)   Depression with anxiety   CHF (congestive heart failure)   Hepatitis C   Elevated troponin I level   Chronic respiratory failure   Drug-seeking behavior   Cough   Hemoptysis    Plan:continue treatment for healthcare associated pneumonia. As she improves from that her hemoptysis should stop.    LOS: 1 day   Kaida Games L 10/22/2014, 9:03  AM

## 2014-10-22 NOTE — H&P (Signed)
PCP:   No PCP Per Patient   Chief Complaint:  Coughing up blood  HPI: 48 yo female comes in reporting coughed up blood today and she is just "not doing well" b/c her mom died last week.  Denies fevers.  Copd on 2 liters o2 at home.  Always sob.  Only change is some blood in her sputum now.  She has chronic pain esp chest pain with mildly chronic elevated trop.  She is requesting some iv pain meds at this time.  On a lot of abx and mult recent hospitalizations.  Review of Systems:  Positive and negative as per HPI otherwise all other systems are negative  Past Medical History: Past Medical History  Diagnosis Date  . COPD (chronic obstructive pulmonary disease)     Home O2 3L  . Coronary atherosclerosis of native coronary artery     Cardiac catheterization 07/2012 - LAD 40%; small D2 with 60 to 70% ostial; OM1 30 to 40%; RCA 20%; PL1 40%   . Hypothyroidism   . Hepatitis C   . Pneumonia   . Cirrhosis   . Type 2 diabetes mellitus   . GERD (gastroesophageal reflux disease)   . Headache(784.0)   . Anxiety   . Atrial fibrillation and flutter   . Chronic pain   . PE (pulmonary embolism)   . Chronic anticoagulation   . History of medication noncompliance   . SSS (sick sinus syndrome)     Biotronik pacemaker  . Cardiomyopathy     LVEF 40-45%   Past Surgical History  Procedure Laterality Date  . Cholecystectomy    . Pacemaker insertion  02/2012    Biotronik PPM   . Cardiac electrophysiology study and ablation    . Esophagogastroduodenoscopy  Jan 2010    Dr. Arlyce Dice: multiple gastric erosions, benign path  . Esophagogastroduodenoscopy N/A 01/10/2014    [id number] EG junction. Query short segment Barrett's esophagus-status post pinch biopsy as described above. Hiatal hernia; otherwise negative EGD  . Insert / replace / remove pacemaker      Medications: Prior to Admission medications   Medication Sig Start Date End Date Taking? Authorizing Provider  albuterol (PROVENTIL  HFA;VENTOLIN HFA) 108 (90 BASE) MCG/ACT inhaler Inhale 2 puffs into the lungs every 6 (six) hours as needed for wheezing or shortness of breath.    Yes Historical Provider, MD  amLODipine (NORVASC) 2.5 MG tablet Take 1 tablet (2.5 mg total) by mouth daily. 07/01/14  Yes Standley Brooking, MD  budesonide-formoterol [facility]) 80-4.5 MCG/ACT inhaler Inhale 2 puffs into the lungs 2 (two) times daily.   Yes Historical Provider, MD  furosemide (LASIX) 80 MG tablet Take 1 tablet (80 mg total) by mouth daily. 08/24/14  Yes Lesle Chris Black, NP  ipratropium-albuterol (DUONEB) 0.5-2.5 (3) MG/3ML SOLN Take 3 mLs by nebulization 3 (three) times daily.   Yes Historical Provider, MD  magnesium oxide (MAG-OX) 400 (241.3 MG) MG tablet Take 1 tablet (400 mg total) by mouth daily. 09/26/14  Yes Lesle Chris Black, NP  metFORMIN (GLUCOPHAGE) 500 MG tablet Take 1 tablet (500 mg total) by mouth at bedtime. 08/24/14  Yes Lesle Chris Black, NP  omeprazole (PRILOSEC) 40 MG capsule Take 1 capsule (40 mg total) by mouth daily. 09/04/14  Yes John L Molpus, MD  potassium chloride 20 MEQ TBCR Take 20 mEq by mouth daily. Take with furosemide (Lasix) 08/24/14  Yes Elliot Cousin, MD  warfarin (COUMADIN) 4 MG tablet Take one tablet a day 10/07/14  Yes  Benny Lennert, MD  aspirin 325 MG tablet Take 1 tablet (325 mg total) by mouth daily. Patient not taking: Reported on 10/21/2014 09/26/14   Gwenyth Bender, NP  levofloxacin (LEVAQUIN) 250 MG tablet Take 1 tablet (250 mg total) by mouth daily. Patient not taking: Reported on 10/21/2014 09/26/14   Gwenyth Bender, NP  LORazepam (ATIVAN) 1 MG tablet Take 0.5 tablets (0.5 mg total) by mouth 3 (three) times daily as needed for anxiety. 10/19/14   Raeford Razor, MD  metoprolol succinate (TOPROL-XL) 25 MG 24 hr tablet Take 1 tablet (25 mg total) by mouth daily. Patient not taking: Reported on 10/21/2014 06/14/14   Gwenyth Bender, NP  naproxen (NAPROSYN) 375 MG tablet Take 1 tablet (375 mg total) by mouth 2  (two) times daily as needed for mild pain or moderate pain. Patient not taking: Reported on 10/21/2014 09/26/14   Gwenyth Bender, NP  predniSONE (DELTASONE) 20 MG tablet Take 1 tablet (20 mg total) by mouth 2 (two) times daily with a meal. Patient not taking: Reported on 10/21/2014 09/26/14   Gwenyth Bender, NP  Pseudoeph-Doxylamine-DM-APAP (NYQUIL PO) Take 2 capsules by mouth once as needed (for congestion).    Historical Provider, MD  QUEtiapine (SEROQUEL) 25 MG tablet Take 1 tablet (25 mg total) by mouth at bedtime. Patient not taking: Reported on 10/21/2014 08/24/14   Gwenyth Bender, NP    Allergies:   Allergies  Allergen Reactions  . Nitroglycerin Other (See Comments)    CAUSED NUMBNESS ALL OVER  . Doxycycline Itching  . Flexeril [Cyclobenzaprine] Other (See Comments)    Sweating, Lightheaded   . Ibuprofen Other (See Comments)    Increase BP, dizziness, lightheaded  . Tramadol Itching  . Tylenol [Acetaminophen] Other (See Comments)    Pt has Hep C  . Vesicare [Solifenacin] Other (See Comments)    Makes my sweat turn yellow  . Aspirin Other (See Comments)    Cannot take due to liver function  . Hydralazine     Altered mental status, hallucinations, angression  . Amoxil [Amoxicillin] Nausea Only    Social History:  reports that she has been smoking Cigarettes.  She has a 15 pack-year smoking history. She uses smokeless tobacco. She reports that she does not drink alcohol or use illicit drugs.  Family History: Family History  Problem Relation Age of Onset  . Coronary artery disease Father 76  . Coronary artery disease Mother 55  . Coronary artery disease Brother   . Colon cancer Neg Hx     Physical Exam: Filed Vitals:   10/21/14 2100 10/21/14 2132 10/21/14 2300 10/22/14 0016  BP:  146/96 135/74 132/79  Pulse: 81 82 85 76  Temp:    98.2 F (36.8 C)  TempSrc:    Oral  Resp: 26 20 19 20   Height:      Weight:    108.092 kg (238 lb 4.8 oz)  SpO2: 97% 94% 98% 97%    General appearance: alert, cooperative and no distress Head: Normocephalic, without obvious abnormality, atraumatic Eyes: negative Nose: Nares normal. Septum midline. Mucosa normal. No drainage or sinus tenderness. Neck: no JVD and supple, symmetrical, trachea midline Lungs: clear to auscultation bilaterally Heart: regular rate and rhythm, S1, S2 normal, no murmur, click, rub or gallop Abdomen: soft, non-tender; bowel sounds normal; no masses,  no organomegaly Extremities: extremities normal, atraumatic, no cyanosis or edema Pulses: 2+ and symmetric Skin: Skin color, texture, turgor normal. No rashes or lesions Neurologic:  Grossly normal    Labs on Admission:   Recent Labs  10/19/14 2114 10/21/14 1850  NA 143 143  K 3.4* 4.2  CL 100 102  CO2 33* 31  GLUCOSE 105* 88  BUN 7 12  CREATININE 0.85 0.83  CALCIUM 8.8 8.7    Recent Labs  10/19/14 2114 10/21/14 1850  WBC 7.2 9.7  NEUTROABS 4.4 6.4  HGB 12.1 12.0  HCT 37.7 37.1  MCV 101.9* 101.6*  PLT 223 229    Recent Labs  10/19/14 2114 10/21/14 1850  TROPONINI 0.53* 0.54*    Radiological Exams on Admission: Dg Chest 2 View  10/21/2014   CLINICAL DATA:  Cough since this morning. Initial encounter. Short of breath and wheezing. Hemoptysis.  EXAM: CHEST  2 VIEW  COMPARISON:  10/19/2014.  FINDINGS: The cardiopericardial silhouette remains enlarged. Dual lead LEFT subclavian cardiac pacemaker. Bilateral basilar atelectasis. LEFT atrial enlargement noted on the lateral view. No focal airspace consolidation. There is pulmonary vascular congestion which is similar to the prior examination.  IMPRESSION: Cardiomegaly and pulmonary vascular congestion. Basilar atelectasis. No change from prior.   Electronically Signed   By: Andreas NewportGeoffrey  Lamke M.D.   On: 10/21/2014 18:41   Dg Chest 2 View  10/19/2014   CLINICAL DATA:  Shortness of breath, chest pain and anxiety for 4-5 days. Intermittent productive cough. History of cirrhosis,  atrial fibrillation and cardiomyopathy.  EXAM: CHEST  2 VIEW  COMPARISON:  Chest radiograph October 07, 2014  FINDINGS: Cardiac silhouette appears mild to moderately enlarged. Mild central pulmonary vascular congestion. Mediastinal silhouette is nonsuspicious. Strandy densities RIGHT lung base, improved aeration with resolution of RIGHT pleural effusion present on prior imaging. Dual lead LEFT cardiac pacemaker in situ. No pneumothorax. Soft tissue planes and included osseous structures are nonsuspicious.  IMPRESSION: Mild to moderate cardiomegaly and central pulmonary vascular congestion.  RIGHT lung base strandy densities may reflect atelectasis or scarring without pleural effusion on today's examination.   Electronically Signed   By: Awilda Metroourtnay  Bloomer   On: 10/19/2014 22:04   Dg Chest 2 View  09/23/2014   CLINICAL DATA:  Nausea, shortness of breath, weakness, cough, and congestion for 2 days.  EXAM: CHEST  2 VIEW  COMPARISON:  08/20/2014  FINDINGS: Interval development of airspace infiltration in the right lung base posteriorly consistent with right lower lobe pneumonia. Small right pleural effusion. Left lung is clear. Heart size and pulmonary vascularity are normal. Cardiac pacemaker unchanged.  IMPRESSION: New right lower lobe infiltration and effusion consistent with pneumonia.   Electronically Signed   By: Burman NievesWilliam  Stevens M.D.   On: 09/23/2014 21:56   Ct Angio Chest Pe W/cm &/or Wo Cm  10/21/2014   CLINICAL DATA:  Hemoptysis. Chest pain and pressure. Weakness and shortness of breath. ICD10: R 0 4.2. History of pacer. COPD. Hepatitis-C.  EXAM: CT ANGIOGRAPHY CHEST WITH CONTRAST  TECHNIQUE: Multidetector CT imaging of the chest was performed using the standard protocol during bolus administration of intravenous contrast. Multiplanar CT image reconstructions and MIPs were obtained to evaluate the vascular anatomy.  CONTRAST:  100mL OMNIPAQUE IOHEXOL 350 MG/ML SOLN  COMPARISON:  Plain films 10/21/2014.   Chest CT 03/10/2014  FINDINGS: Lungs/Pleura: Probable secretions within the non dependent trachea. Mild degradation secondary to patient size and motion. Right middle lobe 5 mm nodule on image this 72 of series 5 is likely similar to the prior exam. Patchy right lower lobe airspace disease with minimal right lower lobe nodularity. Mild left base subsegmental atelectasis.  Scattered tiny pulmonary nodules are again identified.  Small right pleural effusion which is increased since the prior CT.  Heart/Mediastinum: The quality of this examination for evaluation of pulmonary embolism is good. The bolus is well timed. Minor limitations detailed above. No evidence of pulmonary embolism.  Increased number of small low jugular nodes, suboptimally evaluated. Moderate cardiomegaly with prior mitral valve repair. Pacer. No pericardial effusion. Pulmonary artery enlargement, with the outflow tract measuring 3.9 cm.  Increased number of small mediastinal nodes. A pretracheal node measures 1.2 cm on image 35 and is unchanged. No hilar adenopathy.  Upper Abdomen: Mild reflux of contrast into the hepatic veins and IVC. Prominent caudate lobe with irregular hepatic capsule. Normal imaged portions of the spleen, stomach, pancreas, adrenal glands, kidneys. Prominent porta hepatis nodes, including a gastrohepatic ligament node of 1.4 cm on image 103. This is also similar.  Bones/Musculoskeletal:  No acute osseous abnormality.  Review of the MIP images confirms the above findings.  IMPRESSION: 1.  No evidence of pulmonary embolism. 2. Increased small right pleural effusion with patchy right lower lobe airspace disease. Although this could represent atelectasis, infection is favored. 3. Scattered pulmonary nodules, including a 5 mm right middle lobe nodule. Given history of COPD, followup and April of 2016 is indicated to confirm stability. This recommendation follows the consensus statement: Guidelines for Management of Small Pulmonary  Nodules Detected on CT Scans: A Statement from the Fleischner Society as published in Radiology 2005; 237:395-400. 4. Mild thoracic and upper abdominal adenopathy. Recommend attention on follow-up. 5. Suspicion of cirrhosis. Upper abdominal adenopathy is likely secondary and reactive. 6. Pulmonary artery enlargement suggests pulmonary arterial hypertension. Elevated right heart pressures as evidenced by reflux of contrast into the IVC and hepatic veins.   Electronically Signed   By: Jeronimo Greaves M.D.   On: 10/21/2014 21:46   Dg Chest Portable 1 View  10/07/2014   CLINICAL DATA:  Shortness of breath and chest pain. Chest pain for 2 weeks.  EXAM: PORTABLE CHEST - 1 VIEW  COMPARISON:  09/23/2014  FINDINGS: Single view of the chest demonstrates blunting at the right costophrenic angle. Findings are compatible with a pleural effusion. Airspace disease at the right lung base has decreased or resolved. Again noted is a left cardiac pacemaker. Heart size is upper limits of normal but stable. Lung markings are prominent without frank pulmonary edema.  IMPRESSION: The airspace disease in the right lower lung has resolved or decreased but there is now increased pleural fluid at the right lung base. This pleural fluid could be complex or loculated based on the configuration.   Electronically Signed   By: Richarda Overlie M.D.   On: 10/07/2014 22:54    Assessment/Plan  48 yo female with hemoptysis and hcap  Principal Problem:   Possible Healthcare-associated pneumonia-  Broad spectrum abx.  Due to pt freq hospitalizations and pna will consult pulmonary.  Afvss.  Wbc is nml.    Active Problems:  Stable unless o/w noted   COPD (chronic obstructive pulmonary disease)     Chest pain   HCAP (healthcare-associated pneumonia)   Depression with anxiety   CHF (congestive heart failure) compensated   Hepatitis C   Elevated troponin I level   Chronic respiratory failure   Drug-seeking behavior-  Minimize controlled  substances.  obs on medical.  Full code.  Heman Que A 10/22/2014, 1:16 AM

## 2014-10-22 NOTE — Progress Notes (Signed)
48 year old lady with multiple medical problems, admitted for hemoptysis. She was found to have HCAP. Pulmonary consulted and recommedations given. Continue with treatment for HCAP.    Kathlen Mody, MD [phone]

## 2014-10-22 NOTE — Progress Notes (Signed)
Mrs Rance's son called me and stated that the patient had called and said that the MD had stated that she was very sick and had 4 hours to live.  I told him that the most I could tell him was that the patient was "stable".  Mr. Shea Evans wanted to know the patients diagnosis and I went in to ask the patient if I could given any information about her dx and she stated that she did not want to give permission to discuss her care with her son.  I verbalized this to the patients son and he verbalized understanding.

## 2014-10-22 NOTE — Care Management Note (Signed)
    Page 1 of 1   10/25/2014     4:38:40 PM CARE MANAGEMENT NOTE 10/25/2014  Patient:  ABRIELLE, GOLDAMMER   Account Number:  [id number]  Date Initiated:  10/22/2014  Documentation initiated by:  Sharrie Rothman  Subjective/Objective Assessment:   Pt admitted from home with pneumonia. Pt lives with her son and will return home at discharge. Pt stated that she will be moving to [place] in December. Pt receives home O2 from [facility] and has a neb machine for home use.     Action/Plan:   Will continue to follow for discharge planning needs.   Anticipated DC Date:  10/25/2014   Anticipated DC Plan:  HOME/SELF CARE      DC Planning Services  CM consult      Choice offered to / List presented to:     DME arranged  [facility]      DME agency  [facility]        Status of service:  Completed, signed off Medicare Important Message given?   (If response is "NO", the following Medicare IM given date fields will be blank) Date Medicare IM given:   Medicare IM given by:   Date Additional Medicare IM given:   Additional Medicare IM given by:    Discharge Disposition:  HOME/SELF CARE  Per UR Regulation:    If discussed at Long Length of Stay Meetings, dates discussed:    Comments:  10/25/2014 1600 Kathyrn Sheriff, RN, MSN, PCCN Pt to discharge home with self care today. Pt has home O2 via [facility]. Pt will follow in clinic for INR labs. No further CM needs identified at this time.  10/22/14 1445 Arlyss Queen, RN BSN CM

## 2014-10-22 NOTE — Plan of Care (Signed)
Problem: Consults Goal: Pneumonia Patient Education See Patient Educatio Module for education specifics.  Outcome: Completed/Met Date Met:  10/22/14 Goal: Skin Care Protocol Initiated - if Braden Score 18 or less If consults are not indicated, leave blank or document N/A  Outcome: Not Applicable Date Met:  89/78/47 Goal: Nutrition Consult-if indicated Outcome: Not Applicable Date Met:  84/12/82 Goal: Diabetes Guidelines if Diabetic/Glucose > 140 If diabetic or lab glucose is > 140 mg/dl - Initiate Diabetes/Hyperglycemia Guidelines & Document Interventions  Outcome: Not Applicable Date Met:  07/20/87  Problem: Phase I Progression Outcomes Goal: Dyspnea controlled at rest Outcome: Completed/Met Date Met:  10/22/14 Goal: Pain controlled with appropriate interventions Outcome: Completed/Met Date Met:  10/22/14 Goal: OOB as tolerated unless otherwise ordered Outcome: Completed/Met Date Met:  10/22/14 Goal: First antibiotic given within 6hrs of admit Outcome: Completed/Met Date Met:  10/22/14 Goal: Confirm chest x-ray completed Outcome: Completed/Met Date Met:  10/22/14 Goal: Consider Infectious Disease Consult Outcome: Not Applicable Date Met:  71/95/97 Goal: Consider pulmonary consult Outcome: Completed/Met Date Met:  10/22/14 Goal: Code status addressed with pt/family Outcome: Completed/Met Date Met:  10/22/14

## 2014-10-23 DIAGNOSIS — I5032 Chronic diastolic (congestive) heart failure: Secondary | ICD-10-CM

## 2014-10-23 DIAGNOSIS — J411 Mucopurulent chronic bronchitis: Secondary | ICD-10-CM

## 2014-10-23 LAB — CBC
HCT: 37.8 % (ref 36.0–46.0)
Hemoglobin: 12.2 g/dL (ref 12.0–15.0)
MCH: 32.7 pg (ref 26.0–34.0)
MCHC: 32.3 g/dL (ref 30.0–36.0)
MCV: 101.3 fL — AB (ref 78.0–100.0)
PLATELETS: 201 10*3/uL (ref 150–400)
RBC: 3.73 MIL/uL — ABNORMAL LOW (ref 3.87–5.11)
RDW: 15 % (ref 11.5–15.5)
WBC: 12.4 10*3/uL — ABNORMAL HIGH (ref 4.0–10.5)

## 2014-10-23 LAB — BASIC METABOLIC PANEL
Anion gap: 10 (ref 5–15)
BUN: 14 mg/dL (ref 6–23)
CHLORIDE: 96 meq/L (ref 96–112)
CO2: 34 mEq/L — ABNORMAL HIGH (ref 19–32)
Calcium: 8.4 mg/dL (ref 8.4–10.5)
Creatinine, Ser: 0.96 mg/dL (ref 0.50–1.10)
GFR calc Af Amer: 79 mL/min — ABNORMAL LOW (ref >90)
GFR calc non Af Amer: 68 mL/min — ABNORMAL LOW (ref >90)
GLUCOSE: 126 mg/dL — AB (ref 70–99)
Potassium: 3.8 mEq/L (ref 3.7–5.3)
Sodium: 140 mEq/L (ref 137–147)

## 2014-10-23 LAB — GLUCOSE, CAPILLARY
GLUCOSE-CAPILLARY: 133 mg/dL — AB (ref 70–99)
GLUCOSE-CAPILLARY: 139 mg/dL — AB (ref 70–99)
Glucose-Capillary: 131 mg/dL — ABNORMAL HIGH (ref 70–99)
Glucose-Capillary: 113 mg/dL — ABNORMAL HIGH (ref 70–99)

## 2014-10-23 LAB — PROTIME-INR
INR: 2.36 — ABNORMAL HIGH (ref 0.00–1.49)
PROTHROMBIN TIME: 26 s — AB (ref 11.6–15.2)

## 2014-10-23 MED ORDER — MELOXICAM 15 MG PO TABS
15.0000 mg | ORAL_TABLET | Freq: Once | ORAL | Status: AC
  Administered 2014-10-23: 15 mg via ORAL
  Filled 2014-10-23: qty 1

## 2014-10-23 MED ORDER — DIPHENHYDRAMINE HCL 25 MG PO CAPS
25.0000 mg | ORAL_CAPSULE | Freq: Four times a day (QID) | ORAL | Status: DC | PRN
  Administered 2014-10-23: 25 mg via ORAL
  Filled 2014-10-23: qty 1

## 2014-10-23 MED ORDER — MELOXICAM 7.5 MG PO TABS
ORAL_TABLET | ORAL | Status: AC
  Filled 2014-10-23: qty 2

## 2014-10-23 MED ORDER — TRAMADOL HCL 50 MG PO TABS
50.0000 mg | ORAL_TABLET | Freq: Four times a day (QID) | ORAL | Status: DC | PRN
  Filled 2014-10-23 (×2): qty 1

## 2014-10-23 MED ORDER — PREDNISONE 20 MG PO TABS
60.0000 mg | ORAL_TABLET | Freq: Every day | ORAL | Status: AC
  Administered 2014-10-23 – 2014-10-25 (×3): 60 mg via ORAL
  Filled 2014-10-23 (×2): qty 3

## 2014-10-23 MED ORDER — IPRATROPIUM-ALBUTEROL 0.5-2.5 (3) MG/3ML IN SOLN
3.0000 mL | Freq: Four times a day (QID) | RESPIRATORY_TRACT | Status: DC
  Administered 2014-10-23 – 2014-10-25 (×7): 3 mL via RESPIRATORY_TRACT
  Filled 2014-10-23 (×7): qty 3

## 2014-10-23 NOTE — Plan of Care (Signed)
Problem: Phase I Progression Outcomes Goal: Hemodynamically stable Outcome: Progressing  Problem: Phase II Progression Outcomes Goal: Encourage coughing & deep breathing Outcome: Progressing Goal: Wean O2 if indicated Outcome: Progressing Goal: Pain controlled Outcome: Progressing Goal: Tolerating diet Outcome: Progressing

## 2014-10-23 NOTE — Progress Notes (Addendum)
[name] PROGRESS NOTE  Kim [name] NPY:[id number] DOB: Oct 11, 1966 DOA: 10/21/2014 PCP: No PCP Per Patient Interim summary: 48 yo female with prior h/o COPD on 3 lit [place] oxygen at baseline, CAD, hypothyroidism, type 2 DM, h/o PE on anticoagulation, cardiomyopathy, h/o medication non compliance, cirrhosis, comes in for hemoptysis and sob. She was found to have questionable pneumonia and is admitted to medical service for management for health care associated pneumonia. She has chronic pain syndrome. She was also found to have elevated troponin. Her EKG shows paced rhythm with PVC'S. Pulmonary consulted for evaluation of hemoptysis.   Assessment/Plan:  Health care associated Pneumonia: Started on broad spectrum antibiotics with IV vancomycin and IV cefepime. She was found to be wheezing on 11/16 and was started on IV prednisone and bronchodilators every 6 hours. Urine for strep pneumoniae and legionella are pending. Blood cultures drawn and pending.  Hemoptysis is improving. We are currently holding her coumadin and aspirin for hemoptysis. As such she reported she is not taking any aspirin or metoprolol.    Recommend repeating CXR in 1 to 2 days to make sure the pneumonia is getting better.    Elevated troponins: First troponin on admission was slightly elevated. She does not endorse any chest pain at this time. EKG is paced with PVC'S, . She is non compliant to aspirin and metoprolol at home. Iv anticoagulation not started in view of her hemoptysis. Requested cardiology assistance in the evaluation. Echocardiogram from June 2015 showed diastolic dysfunction with good LVEF.    Hypokalemia: repleted as needed.   Ischemic cardiomyopathy/ chronic diastolic heart failure: She appears to be compensated. Resume lasix.    Pulmonary embolism: on coumadin, INR therapeutic. Currently coumadin being held for hemoptysis.     Hypertension: Better controlled.    COPD:  Mild exacerbation,  wheezing has improved today. Started on duonebs and prednisone. She is non compliant to symbicort, started her on dulera while in the hospital.    DVT prophylaxis.: SCD'S       Code Status: FULL CODE Family Communication: no family available. Called son and left message.  Disposition Plan: pending further management.    Consultants:  Pulmonary for hemoptysis  Cardiology for elevated troponins  Procedures:  none  Antibiotics:  cefepimr 11/16 Vancomycin 11/16 HPI/Subjective: Reports back pain. Requesting some pain medications  Objective: Filed Vitals:   10/23/14 0700  BP: 132/72  Pulse: 72  Temp: 98.2 F (36.8 C)  Resp: 18    Intake/Output Summary (Last 24 hours) at 10/23/14 0914 Last data filed at 10/23/14 0859  Gross per 24 hour  Intake    793 ml  Output      0 ml  Net    793 ml   Filed Weights   10/21/14 1750 10/22/14 0016  Weight: 106.595 kg (235 lb) 108.092 kg (238 lb 4.8 oz)    Exam:   General:  Alert afebrile restless  Cardiovascular: s1s2,   Respiratory: diminished air entry at bases  Abdomen: soft non tender non distended bowel sounds heard.   Musculoskeletal: trace pedal edema.   Data Reviewed: Basic Metabolic Panel:  Recent Labs Lab 10/19/14 2114 10/21/14 1850 10/22/14 0619  NA 143 143 139  K 3.4* 4.2 4.8  CL 100 102 100  CO2 33* 31 31  GLUCOSE 105* 88 150*  BUN 7 12 11   CREATININE 0.85 0.83 0.83  CALCIUM 8.8 8.7 8.5   Liver Function Tests: No results for input(s): AST, ALT, ALKPHOS, BILITOT, PROT, ALBUMIN in  the last 168 hours. No results for input(s): LIPASE, AMYLASE in the last 168 hours. No results for input(s): AMMONIA in the last 168 hours. CBC:  Recent Labs Lab 10/19/14 2114 10/21/14 1850 10/22/14 0619 10/23/14 0606  WBC 7.2 9.7 7.8 12.4*  NEUTROABS 4.4 6.4 6.8  --   HGB 12.1 12.0 12.4 12.2  HCT 37.7 37.1 38.6 37.8  MCV 101.9* 101.6* 102.4* 101.3*  PLT 223 229 199 201   Cardiac Enzymes:  Recent  Labs Lab 10/19/14 2114 10/21/14 1850 10/22/14 1420  TROPONINI 0.53* 0.54* 0.36*   BNP (last 3 results)  Recent Labs  07/23/14 1509 08/20/14 1540 10/07/14 1935  PROBNP 1639.0* 1380.0* 2314.0*   CBG:  Recent Labs Lab 10/22/14 2244 10/23/14 0751  GLUCAP 161* 133*    Recent Results (from the past 240 hour(s))  Culture, blood (routine x 2) Call MD if unable to obtain prior to antibiotics being given     Status: None (Preliminary result)   Collection Time: 10/22/14  1:20 AM  Result Value Ref Range Status   Specimen Description Blood BLOOD LEFT HAND  Final   Special Requests BOTTLES DRAWN AEROBIC AND ANAEROBIC 6CC  Final   Culture NO GROWTH <24 HRS  Final   Report Status PENDING  Incomplete  Culture, blood (routine x 2) Call MD if unable to obtain prior to antibiotics being given     Status: None (Preliminary result)   Collection Time: 10/22/14  1:35 AM  Result Value Ref Range Status   Specimen Description Blood BLOOD RIGHT HAND  Final   Special Requests BOTTLES DRAWN AEROBIC AND ANAEROBIC 7CC  Final   Culture NO GROWTH <24 HRS  Final   Report Status PENDING  Incomplete     Studies: Dg Chest 2 View  10/21/2014   CLINICAL DATA:  Cough since this morning. Initial encounter. Short of breath and wheezing. Hemoptysis.  EXAM: CHEST  2 VIEW  COMPARISON:  10/19/2014.  FINDINGS: The cardiopericardial silhouette remains enlarged. Dual lead LEFT subclavian cardiac pacemaker. Bilateral basilar atelectasis. LEFT atrial enlargement noted on the lateral view. No focal airspace consolidation. There is pulmonary vascular congestion which is similar to the prior examination.  IMPRESSION: Cardiomegaly and pulmonary vascular congestion. Basilar atelectasis. No change from prior.   Electronically Signed   By: Andreas NewportGeoffrey  Lamke M.D.   On: 10/21/2014 18:41   Ct Angio Chest Pe W/cm &/or Wo Cm  10/21/2014   CLINICAL DATA:  Hemoptysis. Chest pain and pressure. Weakness and shortness of breath. ICD10: R  0 4.2. History of pacer. COPD. Hepatitis-C.  EXAM: CT ANGIOGRAPHY CHEST WITH CONTRAST  TECHNIQUE: Multidetector CT imaging of the chest was performed using the standard protocol during bolus administration of intravenous contrast. Multiplanar CT image reconstructions and MIPs were obtained to evaluate the vascular anatomy.  CONTRAST:  100mL OMNIPAQUE IOHEXOL 350 MG/ML SOLN  COMPARISON:  Plain films 10/21/2014.  Chest CT 03/10/2014  FINDINGS: Lungs/Pleura: Probable secretions within the non dependent trachea. Mild degradation secondary to patient size and motion. Right middle lobe 5 mm nodule on image this 72 of series 5 is likely similar to the prior exam. Patchy right lower lobe airspace disease with minimal right lower lobe nodularity. Mild left base subsegmental atelectasis. Scattered tiny pulmonary nodules are again identified.  Small right pleural effusion which is increased since the prior CT.  Heart/Mediastinum: The quality of this examination for evaluation of pulmonary embolism is good. The bolus is well timed. Minor limitations detailed above. No evidence of pulmonary  embolism.  Increased number of small low jugular nodes, suboptimally evaluated. Moderate cardiomegaly with prior mitral valve repair. Pacer. No pericardial effusion. Pulmonary artery enlargement, with the outflow tract measuring 3.9 cm.  Increased number of small mediastinal nodes. A pretracheal node measures 1.2 cm on image 35 and is unchanged. No hilar adenopathy.  Upper Abdomen: Mild reflux of contrast into the hepatic veins and IVC. Prominent caudate lobe with irregular hepatic capsule. Normal imaged portions of the spleen, stomach, pancreas, adrenal glands, kidneys. Prominent porta hepatis nodes, including a gastrohepatic ligament node of 1.4 cm on image 103. This is also similar.  Bones/Musculoskeletal:  No acute osseous abnormality.  Review of the MIP images confirms the above findings.  IMPRESSION: 1.  No evidence of pulmonary  embolism. 2. Increased small right pleural effusion with patchy right lower lobe airspace disease. Although this could represent atelectasis, infection is favored. 3. Scattered pulmonary nodules, including a 5 mm right middle lobe nodule. Given history of COPD, followup and April of 2016 is indicated to confirm stability. This recommendation follows the consensus statement: Guidelines for Management of Small Pulmonary Nodules Detected on CT Scans: A Statement from the Fleischner Society as published in Radiology 2005; 237:395-400. 4. Mild thoracic and upper abdominal adenopathy. Recommend attention on follow-up. 5. Suspicion of cirrhosis. Upper abdominal adenopathy is likely secondary and reactive. 6. Pulmonary artery enlargement suggests pulmonary arterial hypertension. Elevated right heart pressures as evidenced by reflux of contrast into the IVC and hepatic veins.   Electronically Signed   By: Jeronimo Greaves M.D.   On: 10/21/2014 21:46    Scheduled Meds: . amLODipine  2.5 mg Oral Daily  . antiseptic oral rinse  7 mL Mouth Rinse BID  . ceFEPime (MAXIPIME) IV  1 g Intravenous Q8H  . furosemide  80 mg Oral Daily  . ipratropium-albuterol  3 mL Nebulization Q6H  . mometasone-formoterol  2 puff Inhalation BID  . sodium chloride  3 mL Intravenous Q12H  . vancomycin  1,000 mg Intravenous Q12H   Continuous Infusions:   Principal Problem:   Healthcare-associated pneumonia Active Problems:   COPD (chronic obstructive pulmonary disease)   Chest pain   HCAP (healthcare-associated pneumonia)   Depression with anxiety   CHF (congestive heart failure)   Hepatitis C   Elevated troponin I level   Chronic respiratory failure   Drug-seeking behavior   Cough   Hemoptysis    Time spent: 25 minutes    Chancy Claros  [name] Pager [phone] If 7PM-7AM, please contact night-coverage at [email or web address], password [facility] 10/23/2014, 9:14 AM  LOS: 2 days

## 2014-10-23 NOTE — Clinical Social Work Note (Signed)
CSW received referral for homeless issues. She reports that she is not homeless. Pt states that when she leaves hospital she will be returning to live with her son, but she has plans to eventually move to [place]. CSW signing off, but can be reconsulted if needed.  Derenda Fennel, [place] [phone]

## 2014-10-23 NOTE — Progress Notes (Signed)
ANTIBIOTIC CONSULT NOTE - follow up   Pharmacy Consult for vancomycin & cefepime Indication:  HCAP  Allergies  Allergen Reactions  . Nitroglycerin Other (See Comments)    CAUSED NUMBNESS ALL OVER  . Doxycycline Itching  . Flexeril [Cyclobenzaprine] Other (See Comments)    Sweating, Lightheaded   . Ibuprofen Other (See Comments)    Increase BP, dizziness, lightheaded  . Tramadol Itching  . Tylenol [Acetaminophen] Other (See Comments)    Pt has Hep C  . Vesicare [Solifenacin] Other (See Comments)    Makes my sweat turn yellow  . Aspirin Other (See Comments)    Cannot take due to liver function  . Hydralazine     Altered mental status, hallucinations, angression  . Amoxil [Amoxicillin] Nausea Only   Patient Measurements: Height: 5\' 9"  (175.3 cm) Weight: 238 lb 4.8 oz (108.092 kg) IBW/kg (Calculated) : 66.2 Adjusted Body Weight: 80 kg  Vital Signs: Temp: 98.2 F (36.8 C) (11/17 0700) Temp Source: Oral (11/17 0700) BP: 132/72 mmHg (11/17 0700) Pulse Rate: 72 (11/17 0700) Intake/Output from previous day: 11/16 0701 - 11/17 0700 In: 790 [P.O.:240; IV Piggyback:550] Out: -  Intake/Output from this shift: Total I/O In: 243 [P.O.:240; I.V.:3] Out: -   Labs:  Recent Labs  10/21/14 1850 10/22/14 0619 10/23/14 0600 10/23/14 0606  WBC 9.7 7.8  --  12.4*  HGB 12.0 12.4  --  12.2  PLT 229 199  --  201  CREATININE 0.83 0.83 0.96  --    Estimated Creatinine Clearance: 92.9 mL/min (by C-G formula based on Cr of 0.96).   Microbiology: Recent Results (from the past 720 hour(s))  Blood culture (routine x 2)     Status: None   Collection Time: 09/23/14 10:39 PM  Result Value Ref Range Status   Specimen Description BLOOD RIGHT ARM  Final   Special Requests BOTTLES DRAWN AEROBIC AND ANAEROBIC 8CC  Final   Culture NO GROWTH 6 DAYS  Final   Report Status 09/29/2014 FINAL  Final  Blood culture (routine x 2)     Status: None   Collection Time: 09/23/14 10:44 PM  Result  Value Ref Range Status   Specimen Description BLOOD RIGHT ANTECUBITAL  Final   Special Requests BOTTLES DRAWN AEROBIC AND ANAEROBIC 9CC  Final   Culture NO GROWTH 6 DAYS  Final   Report Status 09/29/2014 FINAL  Final  MRSA PCR Screening     Status: Abnormal   Collection Time: 09/24/14  1:15 PM  Result Value Ref Range Status   MRSA by PCR POSITIVE (A) NEGATIVE Final    Comment:        The GeneXpert MRSA Assay (FDA approved for NASAL specimens only), is one component of a comprehensive MRSA colonization surveillance program. It is not intended to diagnose MRSA infection nor to guide or monitor treatment for MRSA infections. RESULT CALLED TO, READ BACK BY AND VERIFIED WITH: SEIGLAR,J. AT 1748 ON 09/24/2014 BY BAUGHAM,M.  Culture, blood (routine x 2) Call MD if unable to obtain prior to antibiotics being given     Status: None (Preliminary result)   Collection Time: 10/22/14  1:20 AM  Result Value Ref Range Status   Specimen Description BLOOD LEFT HAND  Final   Special Requests BOTTLES DRAWN AEROBIC AND ANAEROBIC 6CC  Final   Culture NO GROWTH 1 DAY  Final   Report Status PENDING  Incomplete  Culture, blood (routine x 2) Call MD if unable to obtain prior to antibiotics being given  Status: None (Preliminary result)   Collection Time: 10/22/14  1:35 AM  Result Value Ref Range Status   Specimen Description BLOOD RIGHT HAND  Final   Special Requests BOTTLES DRAWN AEROBIC AND ANAEROBIC 7CC  Final   Culture NO GROWTH 1 DAY  Final   Report Status PENDING  Incomplete   Medical History: Past Medical History  Diagnosis Date  . COPD (chronic obstructive pulmonary disease)     Home O2 3L  . Coronary atherosclerosis of native coronary artery     Cardiac catheterization 07/2012 - LAD 40%; small D2 with 60 to 70% ostial; OM1 30 to 40%; RCA 20%; PL1 40%   . Hypothyroidism   . Hepatitis C   . Pneumonia   . Cirrhosis   . Type 2 diabetes mellitus   . GERD (gastroesophageal reflux disease)    . Headache(784.0)   . Anxiety   . Atrial fibrillation and flutter   . Chronic pain   . PE (pulmonary embolism)   . Chronic anticoagulation   . History of medication noncompliance   . SSS (sick sinus syndrome)     Biotronik pacemaker  . Cardiomyopathy     LVEF 40-45%   Medications:  Scheduled:  . amLODipine  2.5 mg Oral Daily  . antiseptic oral rinse  7 mL Mouth Rinse BID  . ceFEPime (MAXIPIME) IV  1 g Intravenous Q8H  . furosemide  80 mg Oral Daily  . ipratropium-albuterol  3 mL Nebulization Q6H  . mometasone-formoterol  2 puff Inhalation BID  . predniSONE  60 mg Oral QAC breakfast  . sodium chloride  3 mL Intravenous Q12H  . vancomycin  1,000 mg Intravenous Q12H   Assessment: 81yr female with complicated medical hx.  Presently treating R/O HCAP with cefepime and vancomycin.  Pt is on chronic anticoagulation with Coumadin at home and had hemoptysis on admission, Coumadin is currently on hold as a result.  Pt still having some hemoptysis reportedly but it has improved.  INR is therapeutic despite no Coumadin yesterday.  Goal of Therapy:  With estimated CrCl 75-36ml/min ( no need to adjust for liver impairment if not 3rd spacing intravascular fluids), will start standard regimen for cefepime and adjust vancomycin for trough serum level 15-30mcg/ml  Plan:  1.  Vancomycin 1gm IV q12h 2.  Cefepime 2gm IV x 1 then 1gm IV q8h 3.  Monitor for indices of infection, for volume of distribution effects from cirrhosis, and renal function 4.  Will check steady state vancomycin trough level as clinically appropriate  Valrie Hart A 10/23/2014,1:15 PM

## 2014-10-23 NOTE — Progress Notes (Signed)
Subjective: She is sleepy this morning but did wake up to do her inhaler treatment. She has no new complaints.her hemoptysis is less  Objective: Vital signs in last 24 hours: Temp:  [97.8 F (36.6 C)-98.7 F (37.1 C)] 98.2 F (36.8 C) (11/17 0700) Pulse Rate:  [72-76] 72 (11/17 0700) Resp:  [16-18] 18 (11/17 0700) BP: (123-148)/(67-84) 132/72 mmHg (11/17 0700) SpO2:  [95 %-100 %] 95 % (11/17 0756) Weight change:  Last BM Date: 10/21/14  Intake/Output from previous day: 11/16 0701 - 11/17 0700 In: 790 [P.O.:240; IV Piggyback:550] Out: -   PHYSICAL EXAM General appearance: sleepy but arousable Resp: rhonchi bilaterally Cardio: regular rate and rhythm, S1, S2 normal, no murmur, click, rub or gallop GI: soft, non-tender; bowel sounds normal; no masses,  no organomegaly Extremities: extremities normal, atraumatic, no cyanosis or edema  Lab Results:  Results for orders placed or performed during the hospital encounter of 10/21/14 (from the past 48 hour(s))  Basic metabolic panel     Status: Abnormal   Collection Time: 10/21/14  6:50 PM  Result Value Ref Range   Sodium 143 137 - 147 mEq/L   Potassium 4.2 3.7 - 5.3 mEq/L    Comment: DELTA CHECK NOTED   Chloride 102 96 - 112 mEq/L   CO2 31 19 - 32 mEq/L   Glucose, Bld 88 70 - 99 mg/dL   BUN 12 6 - 23 mg/dL   Creatinine, Ser 0.83 0.50 - 1.10 mg/dL   Calcium 8.7 8.4 - 10.5 mg/dL   GFR calc non Af Amer 81 (L) >90 mL/min   GFR calc Af Amer >90 >90 mL/min    Comment: (NOTE) The eGFR has been calculated using the CKD EPI equation. This calculation has not been validated in all clinical situations. eGFR's persistently <90 mL/min signify possible Chronic Kidney Disease.    Anion gap 10 5 - 15  CBC with Differential     Status: Abnormal   Collection Time: 10/21/14  6:50 PM  Result Value Ref Range   WBC 9.7 4.0 - 10.5 K/uL   RBC 3.65 (L) 3.87 - 5.11 MIL/uL   Hemoglobin 12.0 12.0 - 15.0 g/dL   HCT 37.1 36.0 - 46.0 %   MCV 101.6  (H) 78.0 - 100.0 fL   MCH 32.9 26.0 - 34.0 pg   MCHC 32.3 30.0 - 36.0 g/dL   RDW 15.5 11.5 - 15.5 %   Platelets 229 150 - 400 K/uL   Neutrophils Relative % 65 43 - 77 %   Neutro Abs 6.4 1.7 - 7.7 K/uL   Lymphocytes Relative 26 12 - 46 %   Lymphs Abs 2.5 0.7 - 4.0 K/uL   Monocytes Relative 8 3 - 12 %   Monocytes Absolute 0.7 0.1 - 1.0 K/uL   Eosinophils Relative 1 0 - 5 %   Eosinophils Absolute 0.1 0.0 - 0.7 K/uL   Basophils Relative 0 0 - 1 %   Basophils Absolute 0.0 0.0 - 0.1 K/uL  Troponin I     Status: Abnormal   Collection Time: 10/21/14  6:50 PM  Result Value Ref Range   Troponin I 0.54 (HH) <0.30 ng/mL    Comment:        Due to the release kinetics of cTnI, a negative result within the first hours of the onset of symptoms does not rule out myocardial infarction with certainty. If myocardial infarction is still suspected, repeat the test at appropriate intervals. CRITICAL RESULT CALLED TO, READ BACK BY AND  VERIFIED WITH: J.ARMSTRONG AT 1919 ON 10/21/14 BY S.VANHOORNE   Protime-INR     Status: Abnormal   Collection Time: 10/21/14 11:27 PM  Result Value Ref Range   Prothrombin Time 29.2 (H) 11.6 - 15.2 seconds   INR 2.73 (H) 0.00 - 1.49  Culture, blood (routine x 2) Call MD if unable to obtain prior to antibiotics being given     Status: None (Preliminary result)   Collection Time: 10/22/14  1:20 AM  Result Value Ref Range   Specimen Description Blood BLOOD LEFT HAND    Special Requests BOTTLES DRAWN AEROBIC AND ANAEROBIC 6CC    Culture NO GROWTH <24 HRS    Report Status PENDING   Culture, blood (routine x 2) Call MD if unable to obtain prior to antibiotics being given     Status: None (Preliminary result)   Collection Time: 10/22/14  1:35 AM  Result Value Ref Range   Specimen Description Blood BLOOD RIGHT HAND    Special Requests BOTTLES DRAWN AEROBIC AND ANAEROBIC [place]    Culture NO GROWTH <24 HRS    Report Status PENDING   CBC WITH DIFFERENTIAL     Status:  Abnormal   Collection Time: 10/22/14  6:19 AM  Result Value Ref Range   WBC 7.8 4.0 - 10.5 K/uL   RBC 3.77 (L) 3.87 - 5.11 MIL/uL   Hemoglobin 12.4 12.0 - 15.0 g/dL   HCT 38.6 36.0 - 46.0 %   MCV 102.4 (H) 78.0 - 100.0 fL   MCH 32.9 26.0 - 34.0 pg   MCHC 32.1 30.0 - 36.0 g/dL   RDW 14.9 11.5 - 15.5 %   Platelets 199 150 - 400 K/uL   Neutrophils Relative % 87 (H) 43 - 77 %   Neutro Abs 6.8 1.7 - 7.7 K/uL   Lymphocytes Relative 8 (L) 12 - 46 %   Lymphs Abs 0.7 0.7 - 4.0 K/uL   Monocytes Relative 5 3 - 12 %   Monocytes Absolute 0.4 0.1 - 1.0 K/uL   Eosinophils Relative 0 0 - 5 %   Eosinophils Absolute 0.0 0.0 - 0.7 K/uL   Basophils Relative 0 0 - 1 %   Basophils Absolute 0.0 0.0 - 0.1 K/uL  Basic metabolic panel     Status: Abnormal   Collection Time: 10/22/14  6:19 AM  Result Value Ref Range   Sodium 139 137 - 147 mEq/L   Potassium 4.8 3.7 - 5.3 mEq/L   Chloride 100 96 - 112 mEq/L   CO2 31 19 - 32 mEq/L   Glucose, Bld 150 (H) 70 - 99 mg/dL   BUN 11 6 - 23 mg/dL   Creatinine, Ser 0.83 0.50 - 1.10 mg/dL   Calcium 8.5 8.4 - 10.5 mg/dL   GFR calc non Af Amer 81 (L) >90 mL/min   GFR calc Af Amer >90 >90 mL/min    Comment: (NOTE) The eGFR has been calculated using the CKD EPI equation. This calculation has not been validated in all clinical situations. eGFR's persistently <90 mL/min signify possible Chronic Kidney Disease.    Anion gap 8 5 - 15  Troponin I     Status: Abnormal   Collection Time: 10/22/14  2:20 PM  Result Value Ref Range   Troponin I 0.36 (HH) <0.30 ng/mL    Comment:        Due to the release kinetics of cTnI, a negative result within the first hours of the onset of symptoms does not rule out  myocardial infarction with certainty. If myocardial infarction is still suspected, repeat the test at appropriate intervals. CRITICAL VALUE NOTED.  VALUE IS CONSISTENT WITH PREVIOUSLY REPORTED AND CALLED VALUE.   Glucose, capillary     Status: Abnormal   Collection  Time: 10/22/14 10:44 PM  Result Value Ref Range   Glucose-Capillary 161 (H) 70 - 99 mg/dL   Comment 1 Notify RN    Comment 2 Documented in Chart   Protime-INR     Status: Abnormal   Collection Time: 10/23/14  6:06 AM  Result Value Ref Range   Prothrombin Time 26.0 (H) 11.6 - 15.2 seconds   INR 2.36 (H) 0.00 - 1.49  CBC     Status: Abnormal   Collection Time: 10/23/14  6:06 AM  Result Value Ref Range   WBC 12.4 (H) 4.0 - 10.5 K/uL   RBC 3.73 (L) 3.87 - 5.11 MIL/uL   Hemoglobin 12.2 12.0 - 15.0 g/dL   HCT 37.8 36.0 - 46.0 %   MCV 101.3 (H) 78.0 - 100.0 fL   MCH 32.7 26.0 - 34.0 pg   MCHC 32.3 30.0 - 36.0 g/dL   RDW 15.0 11.5 - 15.5 %   Platelets 201 150 - 400 K/uL  Glucose, capillary     Status: Abnormal   Collection Time: 10/23/14  7:51 AM  Result Value Ref Range   Glucose-Capillary 133 (H) 70 - 99 mg/dL   Comment 1 Notify RN     ABGS No results for input(s): PHART, PO2ART, TCO2, HCO3 in the last 72 hours.  Invalid input(s): PCO2 CULTURES Recent Results (from the past 240 hour(s))  Culture, blood (routine x 2) Call MD if unable to obtain prior to antibiotics being given     Status: None (Preliminary result)   Collection Time: 10/22/14  1:20 AM  Result Value Ref Range Status   Specimen Description Blood BLOOD LEFT HAND  Final   Special Requests BOTTLES DRAWN AEROBIC AND ANAEROBIC 6CC  Final   Culture NO GROWTH <24 HRS  Final   Report Status PENDING  Incomplete  Culture, blood (routine x 2) Call MD if unable to obtain prior to antibiotics being given     Status: None (Preliminary result)   Collection Time: 10/22/14  1:35 AM  Result Value Ref Range Status   Specimen Description Blood BLOOD RIGHT HAND  Final   Special Requests BOTTLES DRAWN AEROBIC AND ANAEROBIC [place]  Final   Culture NO GROWTH <24 HRS  Final   Report Status PENDING  Incomplete   Studies/Results: Dg Chest 2 View  10/21/2014   CLINICAL DATA:  Cough since this morning. Initial encounter. Short of breath  and wheezing. Hemoptysis.  EXAM: CHEST  2 VIEW  COMPARISON:  10/19/2014.  FINDINGS: The cardiopericardial silhouette remains enlarged. Dual lead LEFT subclavian cardiac pacemaker. Bilateral basilar atelectasis. LEFT atrial enlargement noted on the lateral view. No focal airspace consolidation. There is pulmonary vascular congestion which is similar to the prior examination.  IMPRESSION: Cardiomegaly and pulmonary vascular congestion. Basilar atelectasis. No change from prior.   Electronically Signed   By: Dereck Ligas M.D.   On: 10/21/2014 18:41   Ct Angio Chest Pe W/cm &/or Wo Cm  10/21/2014   CLINICAL DATA:  Hemoptysis. Chest pain and pressure. Weakness and shortness of breath. ICD10: R 0 4.2. History of pacer. COPD. Hepatitis-C.  EXAM: CT ANGIOGRAPHY CHEST WITH CONTRAST  TECHNIQUE: Multidetector CT imaging of the chest was performed using the standard protocol during bolus administration of intravenous  contrast. Multiplanar CT image reconstructions and MIPs were obtained to evaluate the vascular anatomy.  CONTRAST:  139m OMNIPAQUE IOHEXOL 350 MG/ML SOLN  COMPARISON:  Plain films 10/21/2014.  Chest CT 03/10/2014  FINDINGS: Lungs/Pleura: Probable secretions within the non dependent trachea. Mild degradation secondary to patient size and motion. Right middle lobe 5 mm nodule on image this 72 of series 5 is likely similar to the prior exam. Patchy right lower lobe airspace disease with minimal right lower lobe nodularity. Mild left base subsegmental atelectasis. Scattered tiny pulmonary nodules are again identified.  Small right pleural effusion which is increased since the prior CT.  Heart/Mediastinum: The quality of this examination for evaluation of pulmonary embolism is good. The bolus is well timed. Minor limitations detailed above. No evidence of pulmonary embolism.  Increased number of small low jugular nodes, suboptimally evaluated. Moderate cardiomegaly with prior mitral valve repair. Pacer. No  pericardial effusion. Pulmonary artery enlargement, with the outflow tract measuring 3.9 cm.  Increased number of small mediastinal nodes. A pretracheal node measures 1.2 cm on image 35 and is unchanged. No hilar adenopathy.  Upper Abdomen: Mild reflux of contrast into the hepatic veins and IVC. Prominent caudate lobe with irregular hepatic capsule. Normal imaged portions of the spleen, stomach, pancreas, adrenal glands, kidneys. Prominent porta hepatis nodes, including a gastrohepatic ligament node of 1.4 cm on image 103. This is also similar.  Bones/Musculoskeletal:  No acute osseous abnormality.  Review of the MIP images confirms the above findings.  IMPRESSION: 1.  No evidence of pulmonary embolism. 2. Increased small right pleural effusion with patchy right lower lobe airspace disease. Although this could represent atelectasis, infection is favored. 3. Scattered pulmonary nodules, including a 5 mm right middle lobe nodule. Given history of COPD, followup and April of 2016 is indicated to confirm stability. This recommendation follows the consensus statement: Guidelines for Management of Small Pulmonary Nodules Detected on CT Scans: A Statement from the [employer] published in Radiology 2005; 237:395-400. 4. Mild thoracic and upper abdominal adenopathy. Recommend attention on follow-up. 5. Suspicion of cirrhosis. Upper abdominal adenopathy is likely secondary and reactive. 6. Pulmonary artery enlargement suggests pulmonary arterial hypertension. Elevated right heart pressures as evidenced by reflux of contrast into the IVC and hepatic veins.   Electronically Signed   By: KAbigail MiyamotoM.D.   On: 10/21/2014 21:46    Medications:  Prior to Admission:  Prescriptions prior to admission  Medication Sig Dispense Refill Last Dose  . albuterol (PROVENTIL HFA;VENTOLIN HFA) 108 (90 BASE) MCG/ACT inhaler Inhale 2 puffs into the lungs every 6 (six) hours as needed for wheezing or shortness of breath.     10/21/2014 at Unknown time  . amLODipine (NORVASC) 2.5 MG tablet Take 1 tablet (2.5 mg total) by mouth daily. 30 tablet 0 10/21/2014 at Unknown time  . budesonide-formoterol (SYMBICORT) 80-4.5 MCG/ACT inhaler Inhale 2 puffs into the lungs 2 (two) times daily.   10/21/2014 at Unknown time  . furosemide (LASIX) 80 MG tablet Take 1 tablet (80 mg total) by mouth daily. 30 tablet 2 10/21/2014 at Unknown time  . ipratropium-albuterol (DUONEB) 0.5-2.5 (3) MG/3ML SOLN Take 3 mLs by nebulization 3 (three) times daily.   10/21/2014 at Unknown time  . magnesium oxide (MAG-OX) 400 (241.3 MG) MG tablet Take 1 tablet (400 mg total) by mouth daily. 30 tablet 0 10/20/2014 at Unknown time  . metFORMIN (GLUCOPHAGE) 500 MG tablet Take 1 tablet (500 mg total) by mouth at bedtime. 30 tablet 1 Past  Week at Unknown time  . omeprazole (PRILOSEC) 40 MG capsule Take 1 capsule (40 mg total) by mouth daily. 30 capsule 0 10/20/2014 at Unknown time  . potassium chloride 20 MEQ TBCR Take 20 mEq by mouth daily. Take with furosemide (Lasix) 30 tablet 2 10/21/2014 at Unknown time  . warfarin (COUMADIN) 4 MG tablet Take one tablet a day 20 tablet 0 10/21/2014 at Unknown time  . aspirin 325 MG tablet Take 1 tablet (325 mg total) by mouth daily. (Patient not taking: Reported on 10/21/2014)     . levofloxacin (LEVAQUIN) 250 MG tablet Take 1 tablet (250 mg total) by mouth daily. (Patient not taking: Reported on 10/21/2014) 3 tablet 0 10/07/2014 at Unknown time  . LORazepam (ATIVAN) 1 MG tablet Take 0.5 tablets (0.5 mg total) by mouth 3 (three) times daily as needed for anxiety. 15 tablet 0 HAS NOT FILLED  . metoprolol succinate (TOPROL-XL) 25 MG 24 hr tablet Take 1 tablet (25 mg total) by mouth daily. (Patient not taking: Reported on 10/21/2014) 25 tablet 0 more than a month  . naproxen (NAPROSYN) 375 MG tablet Take 1 tablet (375 mg total) by mouth 2 (two) times daily as needed for mild pain or moderate pain. (Patient not taking: Reported on  10/21/2014) 10 tablet 0 10/07/2014 at Unknown time  . predniSONE (DELTASONE) 20 MG tablet Take 1 tablet (20 mg total) by mouth 2 (two) times daily with a meal. (Patient not taking: Reported on 10/21/2014) 5 tablet 0 prescription not filled at Unknown time  . Pseudoeph-Doxylamine-DM-APAP (NYQUIL PO) Take 2 capsules by mouth once as needed (for congestion).   Past Week at Unknown time  . QUEtiapine (SEROQUEL) 25 MG tablet Take 1 tablet (25 mg total) by mouth at bedtime. (Patient not taking: Reported on 10/21/2014) 30 tablet 1 more than a  month   Scheduled: . amLODipine  2.5 mg Oral Daily  . antiseptic oral rinse  7 mL Mouth Rinse BID  . ceFEPime (MAXIPIME) IV  1 g Intravenous Q8H  . furosemide  80 mg Oral Daily  . ipratropium-albuterol  3 mL Nebulization TID  . mometasone-formoterol  2 puff Inhalation BID  . sodium chloride  3 mL Intravenous Q12H  . vancomycin  1,000 mg Intravenous Q12H   Continuous:  FUX:NATFTD chloride, sodium chloride  Assesment:she is admitted with healthcare associated pneumonia and hemoptysis from that. She is chronically anticoagulated. She has COPD at baseline and CHF at baseline but her breathing seems to be better.  Principal Problem:   Healthcare-associated pneumonia Active Problems:   COPD (chronic obstructive pulmonary disease)   Chest pain   HCAP (healthcare-associated pneumonia)   Depression with anxiety   CHF (congestive heart failure)   Hepatitis C   Elevated troponin I level   Chronic respiratory failure   Drug-seeking behavior   Cough   Hemoptysis    Plan:no change in treatments    LOS: 2 days   Darrelle Barrell L 10/23/2014, 8:15 AM

## 2014-10-24 DIAGNOSIS — J440 Chronic obstructive pulmonary disease with acute lower respiratory infection: Secondary | ICD-10-CM

## 2014-10-24 LAB — GLUCOSE, CAPILLARY
Glucose-Capillary: 90 mg/dL (ref 70–99)
Glucose-Capillary: 120 mg/dL — ABNORMAL HIGH (ref 70–99)
Glucose-Capillary: 204 mg/dL — ABNORMAL HIGH (ref 70–99)
Glucose-Capillary: 164 mg/dL — ABNORMAL HIGH (ref 70–99)

## 2014-10-24 LAB — BASIC METABOLIC PANEL
Anion gap: 12 (ref 5–15)
BUN: 16 mg/dL (ref 6–23)
CHLORIDE: 95 meq/L — AB (ref 96–112)
CO2: 35 mEq/L — ABNORMAL HIGH (ref 19–32)
Calcium: 8.1 mg/dL — ABNORMAL LOW (ref 8.4–10.5)
Creatinine, Ser: 0.89 mg/dL (ref 0.50–1.10)
GFR, EST AFRICAN AMERICAN: 87 mL/min — AB (ref >90)
GFR, EST NON AFRICAN AMERICAN: 75 mL/min — AB (ref >90)
Glucose, Bld: 99 mg/dL (ref 70–99)
POTASSIUM: 3.7 meq/L (ref 3.7–5.3)
Sodium: 142 mEq/L (ref 137–147)

## 2014-10-24 LAB — PROTIME-INR
INR: 1.5 — AB (ref 0.00–1.49)
Prothrombin Time: 18.2 seconds — ABNORMAL HIGH (ref 11.6–15.2)

## 2014-10-24 LAB — CBC
HEMATOCRIT: 37.8 % (ref 36.0–46.0)
Hemoglobin: 12.3 g/dL (ref 12.0–15.0)
MCH: 32.5 pg (ref 26.0–34.0)
MCHC: 32.5 g/dL (ref 30.0–36.0)
MCV: 100 fL (ref 78.0–100.0)
PLATELETS: 213 10*3/uL (ref 150–400)
RBC: 3.78 MIL/uL — ABNORMAL LOW (ref 3.87–5.11)
RDW: 14.8 % (ref 11.5–15.5)
WBC: 9.1 10*3/uL (ref 4.0–10.5)

## 2014-10-24 LAB — MAGNESIUM: MAGNESIUM: 1.7 mg/dL (ref 1.5–2.5)

## 2014-10-24 LAB — VANCOMYCIN, TROUGH: Vancomycin Tr: 19.4 ug/mL (ref 10.0–20.0)

## 2014-10-24 LAB — TROPONIN I: TROPONIN I: 0.42 ng/mL — AB (ref <0.30)

## 2014-10-24 MED ORDER — WARFARIN - PHARMACIST DOSING INPATIENT: Status: DC

## 2014-10-24 MED ORDER — WARFARIN SODIUM 2 MG PO TABS
4.0000 mg | ORAL_TABLET | Freq: Once | ORAL | Status: DC
Start: 2014-10-24 — End: 2014-10-24

## 2014-10-24 MED ORDER — OXYCODONE HCL 5 MG PO TABS
5.0000 mg | ORAL_TABLET | Freq: Four times a day (QID) | ORAL | Status: DC | PRN
Start: 2014-10-24 — End: 2014-10-25
  Administered 2014-10-24 – 2014-10-25 (×4): 5 mg via ORAL
  Filled 2014-10-24 (×4): qty 1

## 2014-10-24 MED ORDER — LORAZEPAM 0.5 MG PO TABS
0.5000 mg | ORAL_TABLET | Freq: Three times a day (TID) | ORAL | Status: DC | PRN
Start: 2014-10-24 — End: 2014-10-25
  Administered 2014-10-24 – 2014-10-25 (×3): 0.5 mg via ORAL
  Filled 2014-10-24 (×3): qty 1

## 2014-10-24 MED ORDER — ACETAMINOPHEN 325 MG PO TABS
650.0000 mg | ORAL_TABLET | Freq: Four times a day (QID) | ORAL | Status: DC | PRN
  Filled 2014-10-24: qty 2

## 2014-10-24 MED ORDER — ONDANSETRON HCL 4 MG/2ML IJ SOLN
4.0000 mg | Freq: Three times a day (TID) | INTRAMUSCULAR | Status: DC | PRN
  Administered 2014-10-24: 4 mg via INTRAVENOUS
  Filled 2014-10-24: qty 2

## 2014-10-24 NOTE — Progress Notes (Signed)
ANTIBIOTIC CONSULT NOTE - follow up   Pharmacy Consult for vancomycin & cefepime Indication:  HCAP  Allergies  Allergen Reactions  . Nitroglycerin Other (See Comments)    CAUSED NUMBNESS ALL OVER  . Doxycycline Itching  . Flexeril [Cyclobenzaprine] Other (See Comments)    Sweating, Lightheaded   . Ibuprofen Other (See Comments)    Increase BP, dizziness, lightheaded  . Tramadol Itching  . Tylenol [Acetaminophen] Other (See Comments)    Pt has Hep C  . Vesicare [Solifenacin] Other (See Comments)    Makes my sweat turn yellow  . Aspirin Other (See Comments)    Cannot take due to liver function  . Hydralazine     Altered mental status, hallucinations, angression  . Amoxil [Amoxicillin] Nausea Only   Patient Measurements: Height: 5\' 9"  (175.3 cm) Weight: 238 lb 4.8 oz (108.092 kg) IBW/kg (Calculated) : 66.2 Adjusted Body Weight: 80 kg  Vital Signs: Temp: 98.4 F (36.9 C) (11/18 0727) Temp Source: Oral (11/18 0727) BP: 131/82 mmHg (11/18 0727) Pulse Rate: 76 (11/18 0727) Intake/Output from previous day: 11/17 0701 - 11/18 0700 In: 726 [P.O.:720; I.V.:6] Out: 4 [Urine:4] Intake/Output from this shift:    Labs:  Recent Labs  10/22/14 0619 10/23/14 0600 10/23/14 0606 10/24/14 0609  WBC 7.8  --  12.4* 9.1  HGB 12.4  --  12.2 12.3  PLT 199  --  201 213  CREATININE 0.83 0.96  --  0.89   Estimated Creatinine Clearance: 100.2 mL/min (by C-G formula based on Cr of 0.89).   Microbiology: Recent Results (from the past 720 hour(s))  MRSA PCR Screening     Status: Abnormal   Collection Time: 09/24/14  1:15 PM  Result Value Ref Range Status   MRSA by PCR POSITIVE (A) NEGATIVE Final    Comment:        The GeneXpert MRSA Assay (FDA approved for NASAL specimens only), is one component of a comprehensive MRSA colonization surveillance program. It is not intended to diagnose MRSA infection nor to guide or monitor treatment for MRSA infections. RESULT CALLED TO, READ  BACK BY AND VERIFIED WITH: SEIGLAR,J. AT 1748 ON 09/24/2014 BY BAUGHAM,M.  Culture, blood (routine x 2) Call MD if unable to obtain prior to antibiotics being given     Status: None (Preliminary result)   Collection Time: 10/22/14  1:20 AM  Result Value Ref Range Status   Specimen Description BLOOD LEFT HAND  Final   Special Requests BOTTLES DRAWN AEROBIC AND ANAEROBIC 6CC  Final   Culture NO GROWTH 2 DAYS  Final   Report Status PENDING  Incomplete  Culture, blood (routine x 2) Call MD if unable to obtain prior to antibiotics being given     Status: None (Preliminary result)   Collection Time: 10/22/14  1:35 AM  Result Value Ref Range Status   Specimen Description BLOOD RIGHT HAND  Final   Special Requests BOTTLES DRAWN AEROBIC AND ANAEROBIC 7CC  Final   Culture NO GROWTH 2 DAYS  Final   Report Status PENDING  Incomplete   Medical History: Past Medical History  Diagnosis Date  . COPD (chronic obstructive pulmonary disease)     Home O2 3L  . Coronary atherosclerosis of native coronary artery     Cardiac catheterization 07/2012 - LAD 40%; small D2 with 60 to 70% ostial; OM1 30 to 40%; RCA 20%; PL1 40%   . Hypothyroidism   . Hepatitis C   . Pneumonia   . Cirrhosis   .  Type 2 diabetes mellitus   . GERD (gastroesophageal reflux disease)   . Headache(784.0)   . Anxiety   . Atrial fibrillation and flutter   . Chronic pain   . PE (pulmonary embolism)   . Chronic anticoagulation   . History of medication noncompliance   . SSS (sick sinus syndrome)     Biotronik pacemaker  . Cardiomyopathy     LVEF 40-45%   Medications:  Scheduled:  . amLODipine  2.5 mg Oral Daily  . antiseptic oral rinse  7 mL Mouth Rinse BID  . ceFEPime (MAXIPIME) IV  1 g Intravenous Q8H  . furosemide  80 mg Oral Daily  . ipratropium-albuterol  3 mL Nebulization Q6H  . mometasone-formoterol  2 puff Inhalation BID  . predniSONE  60 mg Oral QAC breakfast  . sodium chloride  3 mL Intravenous Q12H  . vancomycin   1,000 mg Intravenous Q12H  . Warfarin - Pharmacist Dosing Inpatient   Does not apply Q24H   Assessment: [age]yr female with complicated medical hx.  Presently treating R/O HCAP with cefepime and vancomycin.  Some improvement noted.   Pt is on chronic anticoagulation with Coumadin at home and had hemoptysis on admission, Coumadin has been on hold this admission as a result.  Pt still having some scant hemoptysis reportedly but it has improved.  INR has trended down to subtherapeutic level.  D/W Dr Kerry HoughMemon, awaiting MD approval to resume warfarin.    Goal of Therapy:  With estimated CrCl 75-7090ml/min ( no need to adjust for liver impairment if not 3rd spacing intravascular fluids), will start standard regimen for cefepime and adjust vancomycin for trough serum level 15-5120mcg/ml  Plan:  1.  Vancomycin 1gm IV q12h 2.  Cefepime 2gm IV x 1 then 1gm IV q8h 3.  F/U cultures and progress 4.  Will check trough level tonight before dose  Valrie HartHall, Javonna Balli A 10/24/2014,11:29 AM

## 2014-10-24 NOTE — Progress Notes (Signed)
Present with patient for emotional and spiritual support. She was tearful and expressive about her mother's illness and death. She also shared about her broken relationship with a son. I brought her grief support materials and will follow up with her tomorrow.

## 2014-10-24 NOTE — Progress Notes (Signed)
Pt stated she had stomach cramps and could she take some medicine she had in her bag for it.  Nurse asked the pt to give meds to be stored at the pharmacy, all meds were counted with Charna Busman, RN verified and taken to pharmacy after pt signed the form.  Pt stated she takes potassium pills whenever she has stomach cramps.  Pt asked if I could notify Toya Smothers, FNP regarding stomach cramps.  Toya Smothers notified.

## 2014-10-24 NOTE — Progress Notes (Signed)
[name] PROGRESS NOTE  Kim [name] RFX:[id number] DOB: 1966-05-16 DOA: 10/21/2014 PCP: No PCP Per Patient  Assessment/Plan: Health care associated Pneumonia: She is afebrile and non-toxic appearing. Will continue IV vancomycin and IV cefepime day #2. She was found to be wheezing on 11/16 and was started on IV prednisone and bronchodilators every 6 hours that was changed to po prednisone on 11/17 for short burst.  Urine for strep pneumoniae and legionella are pending. Blood cultures no growth to date. Evaluated by pulmonology who recommends continued therapy   Hemoptysis: Related to #1. Improving this am. Holding her coumadin and aspirin for hemoptysis.  Consider repeat CXR in am.  Elevated troponins: First troponin on admission was slightly elevated. Hx of chronic elevated troponin.  She does not endorse any chest pain at this time. EKG is paced with PVC'S, .Hx non compliance to aspirin and metoprolol at home. Iv anticoagulation not started in view of her hemoptysis. Echocardiogram from June 2015 showed diastolic dysfunction with good LVEF.   Hypokalemia: repleted as needed. Check mag level.   Ischemic cardiomyopathy/ chronic diastolic heart failure:  Remains compensated. Volume status +1.7L. Obtain daily weights. Continue lasix   Pulmonary embolism: on coumadin, INR therapeutic. Currently coumadin being held for hemoptysis.   Hypertension: Controlled. Home medications include norvasc, lasix, topol XL.  BB on hold.    COPD:  Mild exacerbation, wheezing continues to improve. Continued on  duonebs and prednisone. She is non compliant to symbicort, started her on dulera while in the hospital.   Code Status: full Family Communication: none present Disposition Plan: home hopefully tomorrow   Consultants:  Pulmonary for hemoptysis  Cardiology for elevated troponins   Procedures:  none  Antibiotics:  cefepimr 11/16 Vancomycin 11/16  HPI/Subjective: Sitting up  in bed eating cheeseburger and french fries  Objective: Filed Vitals:   10/24/14 1415  BP: 129/69  Pulse: 81  Temp: 98 F (36.7 C)  Resp: 20    Intake/Output Summary (Last 24 hours) at 10/24/14 1422 Last data filed at 10/24/14 1353  Gross per 24 hour  Intake    813 ml  Output      1 ml  Net    812 ml   Filed Weights   10/21/14 1750 10/22/14 0016  Weight: 106.595 kg (235 lb) 108.092 kg (238 lb 4.8 oz)    Exam:   General:  Obese appears comfortable  Cardiovascular: RRR No MGR trace LE edema  Respiratory: normal effort BS distant in bases. Faint wheeze  Abdomen: obese +BS non-tender to palpation  Musculoskeletal: no clubbing or cyanosis   Data Reviewed: Basic Metabolic Panel:  Recent Labs Lab 10/19/14 2114 10/21/14 1850 10/22/14 0619 10/23/14 0600 10/24/14 0609  NA 143 143 139 140 142  K 3.4* 4.2 4.8 3.8 3.7  CL 100 102 100 96 95*  CO2 33* 31 31 34* 35*  GLUCOSE 105* 88 150* 126* 99  BUN 7 12 11 14 16   CREATININE 0.85 0.83 0.83 0.96 0.89  CALCIUM 8.8 8.7 8.5 8.4 8.1*   Liver Function Tests: No results for input(s): AST, ALT, ALKPHOS, BILITOT, PROT, ALBUMIN in the last 168 hours. No results for input(s): LIPASE, AMYLASE in the last 168 hours. No results for input(s): AMMONIA in the last 168 hours. CBC:  Recent Labs Lab 10/19/14 2114 10/21/14 1850 10/22/14 0619 10/23/14 0606 10/24/14 0609  WBC 7.2 9.7 7.8 12.4* 9.1  NEUTROABS 4.4 6.4 6.8  --   --   HGB 12.1 12.0 12.4 12.2 12.3  HCT 37.7 37.1 38.6 37.8 37.8  MCV 101.9* 101.6* 102.4* 101.3* 100.0  PLT 223 229 199 201 213   Cardiac Enzymes:  Recent Labs Lab 10/19/14 2114 10/21/14 1850 10/22/14 1420 10/24/14 0609  TROPONINI 0.53* 0.54* 0.36* 0.42*   BNP (last 3 results)  Recent Labs  07/23/14 1509 08/20/14 1540 10/07/14 1935  PROBNP 1639.0* 1380.0* 2314.0*   CBG:  Recent Labs Lab 10/23/14 1122 10/23/14 1620 10/23/14 2009 10/24/14 0756 10/24/14 1125  GLUCAP 131* 139* 113*  90 120*    Recent Results (from the past 240 hour(s))  Culture, blood (routine x 2) Call MD if unable to obtain prior to antibiotics being given     Status: None (Preliminary result)   Collection Time: 10/22/14  1:20 AM  Result Value Ref Range Status   Specimen Description BLOOD LEFT HAND  Final   Special Requests BOTTLES DRAWN AEROBIC AND ANAEROBIC 6CC  Final   Culture NO GROWTH 2 DAYS  Final   Report Status PENDING  Incomplete  Culture, blood (routine x 2) Call MD if unable to obtain prior to antibiotics being given     Status: None (Preliminary result)   Collection Time: 10/22/14  1:35 AM  Result Value Ref Range Status   Specimen Description BLOOD RIGHT HAND  Final   Special Requests BOTTLES DRAWN AEROBIC AND ANAEROBIC 7CC  Final   Culture NO GROWTH 2 DAYS  Final   Report Status PENDING  Incomplete     Studies: No results found.  Scheduled Meds: . amLODipine  2.5 mg Oral Daily  . antiseptic oral rinse  7 mL Mouth Rinse BID  . ceFEPime (MAXIPIME) IV  1 g Intravenous Q8H  . furosemide  80 mg Oral Daily  . ipratropium-albuterol  3 mL Nebulization Q6H  . mometasone-formoterol  2 puff Inhalation BID  . predniSONE  60 mg Oral QAC breakfast  . sodium chloride  3 mL Intravenous Q12H  . vancomycin  1,000 mg Intravenous Q12H   Continuous Infusions:   Principal Problem:   Healthcare-associated pneumonia Active Problems:   COPD (chronic obstructive pulmonary disease)   Chest pain   HCAP (healthcare-associated pneumonia)   Depression with anxiety   CHF (congestive heart failure)   Hepatitis C   Elevated troponin I level   Chronic respiratory failure   Drug-seeking behavior   Cough   Hemoptysis    Time spent: 35 minutes    [facility] CenterBLACK,KAREN M  [name] Pager [phone]. If 7PM-7AM, please contact night-coverage at [email or web address], password [email or web address] 10/24/2014, 2:22 PM  LOS: 3 days

## 2014-10-24 NOTE — Plan of Care (Signed)
Problem: Phase I Progression Outcomes Goal: Voiding-avoid urinary catheter unless indicated Outcome: Completed/Met Date Met:  10/24/14 Goal: Hemodynamically stable Outcome: Progressing     

## 2014-10-24 NOTE — Progress Notes (Signed)
PT Cancellation Note  Patient Details Name: Kim [name] MRN: [id number] DOB: January 15, 1966   Cancelled Treatment:    Reason Eval/Treat Not Completed: Patient declined, no reason specified  Received order and chart reviewed.  Attempted PT evaluation, however when PT arrived pt was sleeping.  Pt easily awoken, though refused PT at this time secondary to complaints of pain; pt reports she told MD and RN made aware.  Will re-attempt evaluation as able.     Donnalee Cellucci 10/24/2014, 9:15 AM

## 2014-10-24 NOTE — Progress Notes (Signed)
CRITICAL VALUE ALERT  Critical value received:  Troponin 0.42  Date of notification:  10/24/14  Time of notification:  0730am  Critical value read backyes  Nurse who received alert:  Rayetta Pigg, RN  MD notified (1st page):  Memon  Time of first page:  0733am  MD notified (2nd page):  Time of second page:  Responding MD:  Dr Kerry Hough aware per history of eleveated trponins.  Time MD responded:

## 2014-10-24 NOTE — Progress Notes (Signed)
Paged  Dr Kerry Hough pt had critical troponin of 0.42.

## 2014-10-24 NOTE — Evaluation (Signed)
L sided CP under the breast Has been present since admission  No change in morphology ? Drug seeking behavior Noted mildly elevated trop previously  EKG and trop x1 Tylenol (pt states allergy to tramadol) and zofran

## 2014-10-24 NOTE — Progress Notes (Signed)
Subjective: She says she feels better. She only coughed up blood once yesterday. She has no other new complaints.  Objective: Vital signs in last 24 hours: Temp:  [97.8 F (36.6 C)-98.4 F (36.9 C)] 98.4 F (36.9 C) (11/18 0727) Pulse Rate:  [73-76] 76 (11/18 0727) Resp:  [18-20] 20 (11/18 0727) BP: (122-131)/(70-82) 131/82 mmHg (11/18 0727) SpO2:  [95 %-100 %] 95 % (11/18 0752) Weight change:  Last BM Date: 10/21/14  Intake/Output from previous day: 11/17 0701 - 11/18 0700 In: 726 [P.O.:720; I.V.:6] Out: 4 [Urine:4]  PHYSICAL EXAM General appearance: sleepy but more awake than yesterday. In no acute distress Resp: rhonchi bilaterally Cardio: her heart is regular to exam now. She has been in atrial fibrillation so this may be a paced rhythm GI: soft, non-tender; bowel sounds normal; no masses,  no organomegaly Extremities: extremities normal, atraumatic, no cyanosis or edema  Lab Results:  Results for orders placed or performed during the hospital encounter of 10/21/14 (from the past 48 hour(s))  Troponin I     Status: Abnormal   Collection Time: 10/22/14  2:20 PM  Result Value Ref Range   Troponin I 0.36 (HH) <0.30 ng/mL    Comment:        Due to the release kinetics of cTnI, a negative result within the first hours of the onset of symptoms does not rule out myocardial infarction with certainty. If myocardial infarction is still suspected, repeat the test at appropriate intervals. CRITICAL VALUE NOTED.  VALUE IS CONSISTENT WITH PREVIOUSLY REPORTED AND CALLED VALUE.   Glucose, capillary     Status: Abnormal   Collection Time: 10/22/14 10:44 PM  Result Value Ref Range   Glucose-Capillary 161 (H) 70 - 99 mg/dL   Comment 1 Notify RN    Comment 2 Documented in Chart   Basic metabolic panel     Status: Abnormal   Collection Time: 10/23/14  6:00 AM  Result Value Ref Range   Sodium 140 137 - 147 mEq/L   Potassium 3.8 3.7 - 5.3 mEq/L    Comment: DELTA CHECK NOTED    Chloride 96 96 - 112 mEq/L   CO2 34 (H) 19 - 32 mEq/L   Glucose, Bld 126 (H) 70 - 99 mg/dL   BUN 14 6 - 23 mg/dL   Creatinine, Ser 0.96 0.50 - 1.10 mg/dL   Calcium 8.4 8.4 - 10.5 mg/dL   GFR calc non Af Amer 68 (L) >90 mL/min   GFR calc Af Amer 79 (L) >90 mL/min    Comment: (NOTE) The eGFR has been calculated using the CKD EPI equation. This calculation has not been validated in all clinical situations. eGFR's persistently <90 mL/min signify possible Chronic Kidney Disease.    Anion gap 10 5 - 15  Protime-INR     Status: Abnormal   Collection Time: 10/23/14  6:06 AM  Result Value Ref Range   Prothrombin Time 26.0 (H) 11.6 - 15.2 seconds   INR 2.36 (H) 0.00 - 1.49  CBC     Status: Abnormal   Collection Time: 10/23/14  6:06 AM  Result Value Ref Range   WBC 12.4 (H) 4.0 - 10.5 K/uL   RBC 3.73 (L) 3.87 - 5.11 MIL/uL   Hemoglobin 12.2 12.0 - 15.0 g/dL   HCT 37.8 36.0 - 46.0 %   MCV 101.3 (H) 78.0 - 100.0 fL   MCH 32.7 26.0 - 34.0 pg   MCHC 32.3 30.0 - 36.0 g/dL   RDW 15.0 11.5 -  15.5 %   Platelets 201 150 - 400 K/uL  Glucose, capillary     Status: Abnormal   Collection Time: 10/23/14  7:51 AM  Result Value Ref Range   Glucose-Capillary 133 (H) 70 - 99 mg/dL   Comment 1 Notify RN   Glucose, capillary     Status: Abnormal   Collection Time: 10/23/14 11:22 AM  Result Value Ref Range   Glucose-Capillary 131 (H) 70 - 99 mg/dL   Comment 1 Notify RN   Glucose, capillary     Status: Abnormal   Collection Time: 10/23/14  4:20 PM  Result Value Ref Range   Glucose-Capillary 139 (H) 70 - 99 mg/dL   Comment 1 Notify RN   Glucose, capillary     Status: Abnormal   Collection Time: 10/23/14  8:09 PM  Result Value Ref Range   Glucose-Capillary 113 (H) 70 - 99 mg/dL  Protime-INR     Status: Abnormal   Collection Time: 10/24/14  6:09 AM  Result Value Ref Range   Prothrombin Time 18.2 (H) 11.6 - 15.2 seconds   INR 1.50 (H) 0.00 - 1.49  CBC     Status: Abnormal   Collection Time:  10/24/14  6:09 AM  Result Value Ref Range   WBC 9.1 4.0 - 10.5 K/uL   RBC 3.78 (L) 3.87 - 5.11 MIL/uL   Hemoglobin 12.3 12.0 - 15.0 g/dL   HCT 37.8 36.0 - 46.0 %   MCV 100.0 78.0 - 100.0 fL   MCH 32.5 26.0 - 34.0 pg   MCHC 32.5 30.0 - 36.0 g/dL   RDW 14.8 11.5 - 15.5 %   Platelets 213 150 - 400 K/uL  Basic metabolic panel     Status: Abnormal   Collection Time: 10/24/14  6:09 AM  Result Value Ref Range   Sodium 142 137 - 147 mEq/L   Potassium 3.7 3.7 - 5.3 mEq/L   Chloride 95 (L) 96 - 112 mEq/L   CO2 35 (H) 19 - 32 mEq/L   Glucose, Bld 99 70 - 99 mg/dL   BUN 16 6 - 23 mg/dL   Creatinine, Ser 0.89 0.50 - 1.10 mg/dL   Calcium 8.1 (L) 8.4 - 10.5 mg/dL   GFR calc non Af Amer 75 (L) >90 mL/min   GFR calc Af Amer 87 (L) >90 mL/min    Comment: (NOTE) The eGFR has been calculated using the CKD EPI equation. This calculation has not been validated in all clinical situations. eGFR's persistently <90 mL/min signify possible Chronic Kidney Disease.    Anion gap 12 5 - 15  Troponin I (q 6hr x 3)     Status: Abnormal   Collection Time: 10/24/14  6:09 AM  Result Value Ref Range   Troponin I 0.42 (HH) <0.30 ng/mL    Comment: CRITICAL RESULT CALLED TO, READ BACK BY AND VERIFIED WITH: PARRISH,N AT 7:30AM ON 10/24/14 BY FESTERMAN,C        Due to the release kinetics of cTnI, a negative result within the first hours of the onset of symptoms does not rule out myocardial infarction with certainty. If myocardial infarction is still suspected, repeat the test at appropriate intervals.   Glucose, capillary     Status: None   Collection Time: 10/24/14  7:56 AM  Result Value Ref Range   Glucose-Capillary 90 70 - 99 mg/dL    ABGS No results for input(s): PHART, PO2ART, TCO2, HCO3 in the last 72 hours.  Invalid input(s): PCO2 CULTURES Recent Results (  from the past 240 hour(s))  Culture, blood (routine x 2) Call MD if unable to obtain prior to antibiotics being given     Status: None  (Preliminary result)   Collection Time: 10/22/14  1:20 AM  Result Value Ref Range Status   Specimen Description BLOOD LEFT HAND  Final   Special Requests BOTTLES DRAWN AEROBIC AND ANAEROBIC 6CC  Final   Culture NO GROWTH 1 DAY  Final   Report Status PENDING  Incomplete  Culture, blood (routine x 2) Call MD if unable to obtain prior to antibiotics being given     Status: None (Preliminary result)   Collection Time: 10/22/14  1:35 AM  Result Value Ref Range Status   Specimen Description BLOOD RIGHT HAND  Final   Special Requests BOTTLES DRAWN AEROBIC AND ANAEROBIC [place]  Final   Culture NO GROWTH 1 DAY  Final   Report Status PENDING  Incomplete   Studies/Results: No results found.  Medications:  Prior to Admission:  Prescriptions prior to admission  Medication Sig Dispense Refill Last Dose  . albuterol (PROVENTIL HFA;VENTOLIN HFA) 108 (90 BASE) MCG/ACT inhaler Inhale 2 puffs into the lungs every 6 (six) hours as needed for wheezing or shortness of breath.    10/21/2014 at Unknown time  . amLODipine (NORVASC) 2.5 MG tablet Take 1 tablet (2.5 mg total) by mouth daily. 30 tablet 0 10/21/2014 at Unknown time  . budesonide-formoterol (SYMBICORT) 80-4.5 MCG/ACT inhaler Inhale 2 puffs into the lungs 2 (two) times daily.   10/21/2014 at Unknown time  . furosemide (LASIX) 80 MG tablet Take 1 tablet (80 mg total) by mouth daily. 30 tablet 2 10/21/2014 at Unknown time  . ipratropium-albuterol (DUONEB) 0.5-2.5 (3) MG/3ML SOLN Take 3 mLs by nebulization 3 (three) times daily.   10/21/2014 at Unknown time  . magnesium oxide (MAG-OX) 400 (241.3 MG) MG tablet Take 1 tablet (400 mg total) by mouth daily. 30 tablet 0 10/20/2014 at Unknown time  . metFORMIN (GLUCOPHAGE) 500 MG tablet Take 1 tablet (500 mg total) by mouth at bedtime. 30 tablet 1 Past Week at Unknown time  . omeprazole (PRILOSEC) 40 MG capsule Take 1 capsule (40 mg total) by mouth daily. 30 capsule 0 10/20/2014 at Unknown time  . potassium  chloride 20 MEQ TBCR Take 20 mEq by mouth daily. Take with furosemide (Lasix) 30 tablet 2 10/21/2014 at Unknown time  . warfarin (COUMADIN) 4 MG tablet Take one tablet a day 20 tablet 0 10/21/2014 at Unknown time  . aspirin 325 MG tablet Take 1 tablet (325 mg total) by mouth daily. (Patient not taking: Reported on 10/21/2014)     . levofloxacin (LEVAQUIN) 250 MG tablet Take 1 tablet (250 mg total) by mouth daily. (Patient not taking: Reported on 10/21/2014) 3 tablet 0 10/07/2014 at Unknown time  . LORazepam (ATIVAN) 1 MG tablet Take 0.5 tablets (0.5 mg total) by mouth 3 (three) times daily as needed for anxiety. 15 tablet 0 HAS NOT FILLED  . metoprolol succinate (TOPROL-XL) 25 MG 24 hr tablet Take 1 tablet (25 mg total) by mouth daily. (Patient not taking: Reported on 10/21/2014) 25 tablet 0 more than a month  . naproxen (NAPROSYN) 375 MG tablet Take 1 tablet (375 mg total) by mouth 2 (two) times daily as needed for mild pain or moderate pain. (Patient not taking: Reported on 10/21/2014) 10 tablet 0 10/07/2014 at Unknown time  . predniSONE (DELTASONE) 20 MG tablet Take 1 tablet (20 mg total) by mouth 2 (two) times  daily with a meal. (Patient not taking: Reported on 10/21/2014) 5 tablet 0 prescription not filled at Unknown time  . Pseudoeph-Doxylamine-DM-APAP (NYQUIL PO) Take 2 capsules by mouth once as needed (for congestion).   Past Week at Unknown time  . QUEtiapine (SEROQUEL) 25 MG tablet Take 1 tablet (25 mg total) by mouth at bedtime. (Patient not taking: Reported on 10/21/2014) 30 tablet 1 more than a  month   Scheduled: . amLODipine  2.5 mg Oral Daily  . antiseptic oral rinse  7 mL Mouth Rinse BID  . ceFEPime (MAXIPIME) IV  1 g Intravenous Q8H  . furosemide  80 mg Oral Daily  . ipratropium-albuterol  3 mL Nebulization Q6H  . mometasone-formoterol  2 puff Inhalation BID  . predniSONE  60 mg Oral QAC breakfast  . sodium chloride  3 mL Intravenous Q12H  . vancomycin  1,000 mg Intravenous Q12H    Continuous:  SLP:NPYYFR chloride, acetaminophen, diphenhydrAMINE, ondansetron (ZOFRAN) IV, sodium chloride, traMADol  Assesment:she is admitted with healthcare associated pneumonia. She had hemoptysis from that. She is chronically anticoagulated which of course is cause more trouble with the hemoptysis. Her hemoptysis seems to be clearing.  Principal Problem:   Healthcare-associated pneumonia Active Problems:   COPD (chronic obstructive pulmonary disease)   Chest pain   HCAP (healthcare-associated pneumonia)   Depression with anxiety   CHF (congestive heart failure)   Hepatitis C   Elevated troponin I level   Chronic respiratory failure   Drug-seeking behavior   Cough   Hemoptysis    Plan:continue with current treatments    LOS: 3 days   Jatasia Gundrum L 10/24/2014, 8:42 AM

## 2014-10-25 DIAGNOSIS — J449 Chronic obstructive pulmonary disease, unspecified: Secondary | ICD-10-CM

## 2014-10-25 DIAGNOSIS — R7989 Other specified abnormal findings of blood chemistry: Secondary | ICD-10-CM

## 2014-10-25 LAB — LEGIONELLA ANTIGEN, URINE

## 2014-10-25 LAB — PROTIME-INR
INR: 1.22 (ref 0.00–1.49)
PROTHROMBIN TIME: 15.5 s — AB (ref 11.6–15.2)

## 2014-10-25 LAB — STREP PNEUMONIAE URINARY ANTIGEN: Strep Pneumo Urinary Antigen: NEGATIVE

## 2014-10-25 MED ORDER — LEVOFLOXACIN 500 MG PO TABS: 500.0000 mg | ORAL_TABLET | Freq: Every day | ORAL | Status: DC

## 2014-10-25 MED ORDER — LEVOFLOXACIN 500 MG PO TABS
500.0000 mg | ORAL_TABLET | Freq: Every day | ORAL | Status: AC
Start: 2014-10-25 — End: ?

## 2014-10-25 MED ORDER — WARFARIN SODIUM 5 MG PO TABS
6.0000 mg | ORAL_TABLET | Freq: Once | ORAL | Status: AC
  Administered 2014-10-25: 6 mg via ORAL
  Filled 2014-10-25 (×2): qty 1

## 2014-10-25 MED ORDER — OXYCODONE HCL 5 MG PO TABS: 5.0000 mg | ORAL_TABLET | Freq: Four times a day (QID) | ORAL | Status: AC | PRN

## 2014-10-25 MED ORDER — LORAZEPAM 1 MG PO TABS: 0.5000 mg | ORAL_TABLET | Freq: Three times a day (TID) | ORAL | Status: AC | PRN

## 2014-10-25 MED ORDER — WARFARIN - PHARMACIST DOSING INPATIENT: Status: DC

## 2014-10-25 NOTE — Progress Notes (Signed)
Pt wouldn't wake up for direction on the flutter, RT will try again this evening

## 2014-10-25 NOTE — Progress Notes (Signed)
Pt again refuses PT.  She states that she is getting up and walking to the bathroom independently with no problem.  She reports being very sad over her mother's death and she clearly wanted to be left alone.

## 2014-10-25 NOTE — Progress Notes (Signed)
Subjective: She feels well. She has no complaints except she still somewhat short of breath. She is not coughing up any sputum  Objective: Vital signs in last 24 hours: Temp:  [97.8 F (36.6 C)-98 F (36.7 C)] 97.8 F (36.6 C) (11/19 0558) Pulse Rate:  [75-81] 75 (11/19 0558) Resp:  [20] 20 (11/19 0558) BP: (129-132)/(69-80) 132/80 mmHg (11/19 0558) SpO2:  [92 %-100 %] 97 % (11/19 0723) Weight:  [108.181 kg (238 lb 7.9 oz)-108.183 kg (238 lb 8 oz)] 108.181 kg (238 lb 7.9 oz) (11/19 0558) Weight change:  Last BM Date: 10/21/14  Intake/Output from previous day: 11/18 0701 - 11/19 0700 In: 1230 [P.O.:680; IV Piggyback:550] Out: 700 [Urine:700]  PHYSICAL EXAM General appearance: alert, cooperative and moderate distress Resp: rhonchi bilaterally Cardio: regular rate and rhythm, S1, S2 normal, no murmur, click, rub or gallop GI: soft, non-tender; bowel sounds normal; no masses,  no organomegaly Extremities: extremities normal, atraumatic, no cyanosis or edema  Lab Results:  Results for orders placed or performed during the hospital encounter of 10/21/14 (from the past 48 hour(s))  Glucose, capillary     Status: Abnormal   Collection Time: 10/23/14 11:22 AM  Result Value Ref Range   Glucose-Capillary 131 (H) 70 - 99 mg/dL   Comment 1 Notify RN   Glucose, capillary     Status: Abnormal   Collection Time: 10/23/14  4:20 PM  Result Value Ref Range   Glucose-Capillary 139 (H) 70 - 99 mg/dL   Comment 1 Notify RN   Glucose, capillary     Status: Abnormal   Collection Time: 10/23/14  8:09 PM  Result Value Ref Range   Glucose-Capillary 113 (H) 70 - 99 mg/dL  Magnesium     Status: None   Collection Time: 10/24/14  6:01 AM  Result Value Ref Range   Magnesium 1.7 1.5 - 2.5 mg/dL  Protime-INR     Status: Abnormal   Collection Time: 10/24/14  6:09 AM  Result Value Ref Range   Prothrombin Time 18.2 (H) 11.6 - 15.2 seconds   INR 1.50 (H) 0.00 - 1.49  CBC     Status: Abnormal   Collection Time: 10/24/14  6:09 AM  Result Value Ref Range   WBC 9.1 4.0 - 10.5 K/uL   RBC 3.78 (L) 3.87 - 5.11 MIL/uL   Hemoglobin 12.3 12.0 - 15.0 g/dL   HCT 37.8 36.0 - 46.0 %   MCV 100.0 78.0 - 100.0 fL   MCH 32.5 26.0 - 34.0 pg   MCHC 32.5 30.0 - 36.0 g/dL   RDW 14.8 11.5 - 15.5 %   Platelets 213 150 - 400 K/uL  Basic metabolic panel     Status: Abnormal   Collection Time: 10/24/14  6:09 AM  Result Value Ref Range   Sodium 142 137 - 147 mEq/L   Potassium 3.7 3.7 - 5.3 mEq/L   Chloride 95 (L) 96 - 112 mEq/L   CO2 35 (H) 19 - 32 mEq/L   Glucose, Bld 99 70 - 99 mg/dL   BUN 16 6 - 23 mg/dL   Creatinine, Ser 0.89 0.50 - 1.10 mg/dL   Calcium 8.1 (L) 8.4 - 10.5 mg/dL   GFR calc non Af Amer 75 (L) >90 mL/min   GFR calc Af Amer 87 (L) >90 mL/min    Comment: (NOTE) The eGFR has been calculated using the CKD EPI equation. This calculation has not been validated in all clinical situations. eGFR's persistently <90 mL/min signify possible Chronic Kidney  Disease.    Anion gap 12 5 - 15  Troponin I (q 6hr x 3)     Status: Abnormal   Collection Time: 10/24/14  6:09 AM  Result Value Ref Range   Troponin I 0.42 (HH) <0.30 ng/mL    Comment: CRITICAL RESULT CALLED TO, READ BACK BY AND VERIFIED WITH: PARRISH,N AT 7:30AM ON 10/24/14 BY FESTERMAN,C        Due to the release kinetics of cTnI, a negative result within the first hours of the onset of symptoms does not rule out myocardial infarction with certainty. If myocardial infarction is still suspected, repeat the test at appropriate intervals.   Glucose, capillary     Status: None   Collection Time: 10/24/14  7:56 AM  Result Value Ref Range   Glucose-Capillary 90 70 - 99 mg/dL  Glucose, capillary     Status: Abnormal   Collection Time: 10/24/14 11:25 AM  Result Value Ref Range   Glucose-Capillary 120 (H) 70 - 99 mg/dL  Strep pneumoniae urinary antigen     Status: None   Collection Time: 10/24/14  2:26 PM  Result Value Ref Range    Strep Pneumo Urinary Antigen NEGATIVE NEGATIVE    Comment: Performed at [facility]        Infection due to S. pneumoniae cannot be absolutely ruled out since the antigen present may be below the detection limit of the test. Performed at [facility]   Glucose, capillary     Status: Abnormal   Collection Time: 10/24/14  4:34 PM  Result Value Ref Range   Glucose-Capillary 204 (H) 70 - 99 mg/dL  Glucose, capillary     Status: Abnormal   Collection Time: 10/24/14 10:13 PM  Result Value Ref Range   Glucose-Capillary 164 (H) 70 - 99 mg/dL   Comment 1 Notify RN   Vancomycin, trough     Status: None   Collection Time: 10/24/14 10:58 PM  Result Value Ref Range   Vancomycin Tr 19.4 10.0 - 20.0 ug/mL  Protime-INR     Status: Abnormal   Collection Time: 10/25/14  5:53 AM  Result Value Ref Range   Prothrombin Time 15.5 (H) 11.6 - 15.2 seconds   INR 1.22 0.00 - 1.49    ABGS No results for input(s): PHART, PO2ART, TCO2, HCO3 in the last 72 hours.  Invalid input(s): PCO2 CULTURES Recent Results (from the past 240 hour(s))  Culture, blood (routine x 2) Call MD if unable to obtain prior to antibiotics being given     Status: None (Preliminary result)   Collection Time: 10/22/14  1:20 AM  Result Value Ref Range Status   Specimen Description BLOOD LEFT HAND  Final   Special Requests BOTTLES DRAWN AEROBIC AND ANAEROBIC 6CC  Final   Culture NO GROWTH 2 DAYS  Final   Report Status PENDING  Incomplete  Culture, blood (routine x 2) Call MD if unable to obtain prior to antibiotics being given     Status: None (Preliminary result)   Collection Time: 10/22/14  1:35 AM  Result Value Ref Range Status   Specimen Description BLOOD RIGHT HAND  Final   Special Requests BOTTLES DRAWN AEROBIC AND ANAEROBIC [place]  Final   Culture NO GROWTH 2 DAYS  Final   Report Status PENDING  Incomplete   Studies/Results: No results found.  Medications:  Prior to Admission:  Prescriptions prior  to admission  Medication Sig Dispense Refill Last Dose  . albuterol (PROVENTIL HFA;VENTOLIN HFA) 108 (90  BASE) MCG/ACT inhaler Inhale 2 puffs into the lungs every 6 (six) hours as needed for wheezing or shortness of breath.    10/21/2014 at Unknown time  . amLODipine (NORVASC) 2.5 MG tablet Take 1 tablet (2.5 mg total) by mouth daily. 30 tablet 0 10/21/2014 at Unknown time  . budesonide-formoterol (SYMBICORT) 80-4.5 MCG/ACT inhaler Inhale 2 puffs into the lungs 2 (two) times daily.   10/21/2014 at Unknown time  . furosemide (LASIX) 80 MG tablet Take 1 tablet (80 mg total) by mouth daily. 30 tablet 2 10/21/2014 at Unknown time  . ipratropium-albuterol (DUONEB) 0.5-2.5 (3) MG/3ML SOLN Take 3 mLs by nebulization 3 (three) times daily.   10/21/2014 at Unknown time  . magnesium oxide (MAG-OX) 400 (241.3 MG) MG tablet Take 1 tablet (400 mg total) by mouth daily. 30 tablet 0 10/20/2014 at Unknown time  . metFORMIN (GLUCOPHAGE) 500 MG tablet Take 1 tablet (500 mg total) by mouth at bedtime. 30 tablet 1 Past Week at Unknown time  . omeprazole (PRILOSEC) 40 MG capsule Take 1 capsule (40 mg total) by mouth daily. 30 capsule 0 10/20/2014 at Unknown time  . potassium chloride 20 MEQ TBCR Take 20 mEq by mouth daily. Take with furosemide (Lasix) 30 tablet 2 10/21/2014 at Unknown time  . warfarin (COUMADIN) 4 MG tablet Take one tablet a day 20 tablet 0 10/21/2014 at Unknown time  . aspirin 325 MG tablet Take 1 tablet (325 mg total) by mouth daily. (Patient not taking: Reported on 10/21/2014)     . levofloxacin (LEVAQUIN) 250 MG tablet Take 1 tablet (250 mg total) by mouth daily. (Patient not taking: Reported on 10/21/2014) 3 tablet 0 10/07/2014 at Unknown time  . LORazepam (ATIVAN) 1 MG tablet Take 0.5 tablets (0.5 mg total) by mouth 3 (three) times daily as needed for anxiety. 15 tablet 0 HAS NOT FILLED  . metoprolol succinate (TOPROL-XL) 25 MG 24 hr tablet Take 1 tablet (25 mg total) by mouth daily. (Patient not  taking: Reported on 10/21/2014) 25 tablet 0 more than a month  . naproxen (NAPROSYN) 375 MG tablet Take 1 tablet (375 mg total) by mouth 2 (two) times daily as needed for mild pain or moderate pain. (Patient not taking: Reported on 10/21/2014) 10 tablet 0 10/07/2014 at Unknown time  . predniSONE (DELTASONE) 20 MG tablet Take 1 tablet (20 mg total) by mouth 2 (two) times daily with a meal. (Patient not taking: Reported on 10/21/2014) 5 tablet 0 prescription not filled at Unknown time  . Pseudoeph-Doxylamine-DM-APAP (NYQUIL PO) Take 2 capsules by mouth once as needed (for congestion).   Past Week at Unknown time  . QUEtiapine (SEROQUEL) 25 MG tablet Take 1 tablet (25 mg total) by mouth at bedtime. (Patient not taking: Reported on 10/21/2014) 30 tablet 1 more than a  month   Scheduled: . amLODipine  2.5 mg Oral Daily  . antiseptic oral rinse  7 mL Mouth Rinse BID  . ceFEPime (MAXIPIME) IV  1 g Intravenous Q8H  . furosemide  80 mg Oral Daily  . ipratropium-albuterol  3 mL Nebulization Q6H  . mometasone-formoterol  2 puff Inhalation BID  . predniSONE  60 mg Oral QAC breakfast  . sodium chloride  3 mL Intravenous Q12H  . vancomycin  1,000 mg Intravenous Q12H   Continuous:  ALP:FXTKWI chloride, acetaminophen, diphenhydrAMINE, LORazepam, ondansetron (ZOFRAN) IV, oxyCODONE, sodium chloride, traMADol  Assesment: She was admitted with healthcare associated pneumonia and hemoptysis associated with that. She has pretty severe COPD at baseline.  Overall she is much improved. She complains that she can't cough up any sputum. Principal Problem:   Healthcare-associated pneumonia Active Problems:   COPD (chronic obstructive pulmonary disease)   Chest pain   HCAP (healthcare-associated pneumonia)   Depression with anxiety   CHF (congestive heart failure)   Hepatitis C   Elevated troponin I level   Chronic respiratory failure   Drug-seeking behavior   Cough   Hemoptysis    Plan: I think plans are for  her to go home today but I'm going to get her a flutter valve to take home with her this should help with breaking up the sputum.    LOS: 4 days   Rhyker Silversmith L 10/25/2014, 8:37 AM

## 2014-10-25 NOTE — Discharge Summary (Signed)
Physician Discharge Summary  Kim [name] OMV:[id number] DOB: 30-May-1966 DOA: 10/21/2014  PCP: No PCP Per Patient  Admit date: 10/21/2014 Discharge date: 10/25/2014  Time spent: 40 minutes  Recommendations for Outpatient Follow-up:  1. Follow up with Dr Juanetta Gosling pulmonology 2 weeks for evaluation of respiratory statu 2. Call Dr Margo Aye dermatology in 1 week for evaluation of lesion on left side of face 3. INR check 10/31/14.  Discharge Diagnoses:  Principal Problem:   Healthcare-associated pneumonia Active Problems:   COPD (chronic obstructive pulmonary disease)   Chest pain   HCAP (healthcare-associated pneumonia)   Depression with anxiety   CHF (congestive heart failure)   Hepatitis C   Elevated troponin I level   Chronic respiratory failure   Drug-seeking behavior   Cough   Hemoptysis   Discharge Condition: stable  Diet recommendation: heart healthy  Filed Weights   10/22/14 0016 10/24/14 1417 10/25/14 0558  Weight: 108.092 kg (238 lb 4.8 oz) 108.183 kg (238 lb 8 oz) 108.181 kg (238 lb 7.9 oz)    History of present illness:  48 yo female presents to ED 10/22/14 reporting coughing up blood  and  "not doing well" b/c her mom died last week. Denied fevers. Copd on 2 liters o2 at home. Always sob. Only change is some blood in her sputum. She had chronic pain esp chest pain with mildly chronic elevated trop. She requested some iv pain meds. On a lot of abx and mult recent hospitalizations.  Hospital Course:  Health care associated Pneumonia: She remained afebrile and non-toxic appearing. Provided with IV vancomycin and IV cefepime day. She was found to be wheezing on 11/16 and provided short burst prednisone. Urine for strep pneumonia negative. Blood cultures no growth to date. Evaluated by pulmonology who recommended flutter valve. At discharge normal respiratory effort oxygen saturation at baseline. No wheeze.  Will discharge with Levaquin and flutter valve. Follow up  Dr Juanetta Gosling 2 weeks. May want to repeat xray to ensure resolution of pneumonia  Hemoptysis: Related to #1. Resolved at discharge  Elevated troponins: First troponin on admission was slightly elevated. Hx of chronic elevated troponin. No episodes of  chest pain. EKG is paced with PVC'S, .Hx non compliance to aspirin and metoprolol at home. Echocardiogram from June 2015 showed diastolic dysfunction with good LVEF.   Hypokalemia: resolved at discharge.   Ischemic cardiomyopathy/ chronic diastolic heart failure:  Remained compensated.   Pulmonary embolism: resume coumadin.   Hypertension: Controlled.    COPD:  Mild exacerbation. Resolved at discharge. See #1   Procedures:  none  Consultations:  Dr Juanetta Gosling  Discharge Exam: Filed Vitals:   10/25/14 0558  BP: 132/80  Pulse: 75  Temp: 97.8 F (36.6 C)  Resp: 20    General: appears well  Cardiovascular: RRR No MGR  Respiratory: Normal effort BS distant in base but no wheeze or crackles  Discharge Instructions You were cared for by a hospitalist during your hospital stay. If you have any questions about your discharge medications or the care you received while you were in the hospital after you are discharged, you can call the unit and asked to speak with the hospitalist on call if the hospitalist that took care of you is not available. Once you are discharged, your primary care physician will handle any further medical issues. Please note that NO REFILLS for any discharge medications will be authorized once you are discharged, as it is imperative that you return to your primary care physician (or establish a  relationship with a primary care physician if you do not have one) for your aftercare needs so that they can reassess your need for medications and monitor your lab values.  Discharge Instructions    Diet - low sodium heart healthy    Complete by:  As directed      Discharge instructions    Complete by:  As directed    Take medications as directed.  Follow up with PCP as instructed     Increase activity slowly    Complete by:  As directed           Current Discharge Medication List    START taking these medications   Details  oxyCODONE (OXY IR/ROXICODONE) 5 MG immediate release tablet Take 1 tablet (5 mg total) by mouth every 6 (six) hours as needed for severe pain. Qty: 15 tablet, Refills: 0      CONTINUE these medications which have CHANGED   Details  levofloxacin (LEVAQUIN) 500 MG tablet Take 1 tablet (500 mg total) by mouth daily. Qty: 5 tablet, Refills: 0    LORazepam (ATIVAN) 1 MG tablet Take 0.5 tablets (0.5 mg total) by mouth 3 (three) times daily as needed for anxiety. Qty: 15 tablet, Refills: 0      CONTINUE these medications which have NOT CHANGED   Details  albuterol (PROVENTIL HFA;VENTOLIN HFA) 108 (90 BASE) MCG/ACT inhaler Inhale 2 puffs into the lungs every 6 (six) hours as needed for wheezing or shortness of breath.     amLODipine (NORVASC) 2.5 MG tablet Take 1 tablet (2.5 mg total) by mouth daily. Qty: 30 tablet, Refills: 0    budesonide-formoterol (SYMBICORT) 80-4.5 MCG/ACT inhaler Inhale 2 puffs into the lungs 2 (two) times daily.    furosemide (LASIX) 80 MG tablet Take 1 tablet (80 mg total) by mouth daily. Qty: 30 tablet, Refills: 2    ipratropium-albuterol (DUONEB) 0.5-2.5 (3) MG/3ML SOLN Take 3 mLs by nebulization 3 (three) times daily.    magnesium oxide (MAG-OX) 400 (241.3 MG) MG tablet Take 1 tablet (400 mg total) by mouth daily. Qty: 30 tablet, Refills: 0    metFORMIN (GLUCOPHAGE) 500 MG tablet Take 1 tablet (500 mg total) by mouth at bedtime. Qty: 30 tablet, Refills: 1    omeprazole (PRILOSEC) 40 MG capsule Take 1 capsule (40 mg total) by mouth daily. Qty: 30 capsule, Refills: 0    potassium chloride 20 MEQ TBCR Take 20 mEq by mouth daily. Take with furosemide (Lasix) Qty: 30 tablet, Refills: 2    warfarin (COUMADIN) 4 MG tablet Take one tablet a  day Qty: 20 tablet, Refills: 0    aspirin 325 MG tablet Take 1 tablet (325 mg total) by mouth daily.    metoprolol succinate (TOPROL-XL) 25 MG 24 hr tablet Take 1 tablet (25 mg total) by mouth daily. Qty: 25 tablet, Refills: 0    naproxen (NAPROSYN) 375 MG tablet Take 1 tablet (375 mg total) by mouth 2 (two) times daily as needed for mild pain or moderate pain. Qty: 10 tablet, Refills: 0    Pseudoeph-Doxylamine-DM-APAP (NYQUIL PO) Take 2 capsules by mouth once as needed (for congestion).    QUEtiapine (SEROQUEL) 25 MG tablet Take 1 tablet (25 mg total) by mouth at bedtime. Qty: 30 tablet, Refills: 1      STOP taking these medications     predniSONE (DELTASONE) 20 MG tablet        Allergies  Allergen Reactions  . Nitroglycerin Other (See Comments)    CAUSED  NUMBNESS ALL OVER  . Doxycycline Itching  . Flexeril [Cyclobenzaprine] Other (See Comments)    Sweating, Lightheaded   . Ibuprofen Other (See Comments)    Increase BP, dizziness, lightheaded  . Tramadol Itching  . Tylenol [Acetaminophen] Other (See Comments)    Pt has Hep C  . Vesicare [Solifenacin] Other (See Comments)    Makes my sweat turn yellow  . Aspirin Other (See Comments)    Cannot take due to liver function  . Hydralazine     Altered mental status, hallucinations, angression  . Amoxil [Amoxicillin] Nausea Only   Follow-up Information    Follow up with [facility] On 10/31/2014.   Why:  at 2:00 pm   Contact information:   [address] [place] [address]       Follow up with Cassie Freer, Benjaman Lobe, MD. Schedule an appointment as soon as possible for a visit in 1 week.   Specialty:  Dermatology   Why:  for evaluation skin lesion    Contact information:   [address] [facility] [place] [address] [phone]       Follow up with HAWKINS,EDWARD L, MD In 2 weeks.   Specialty:  Pulmonary Disease   Why:  follow up on respiratory status   Contact  information:   [address] [place] [address] [phone]        The results of significant diagnostics from this hospitalization (including imaging, microbiology, ancillary and laboratory) are listed below for reference.    Significant Diagnostic Studies: Dg Chest 2 View  10/21/2014   CLINICAL DATA:  Cough since this morning. Initial encounter. Short of breath and wheezing. Hemoptysis.  EXAM: CHEST  2 VIEW  COMPARISON:  10/19/2014.  FINDINGS: The cardiopericardial silhouette remains enlarged. Dual lead LEFT subclavian cardiac pacemaker. Bilateral basilar atelectasis. LEFT atrial enlargement noted on the lateral view. No focal airspace consolidation. There is pulmonary vascular congestion which is similar to the prior examination.  IMPRESSION: Cardiomegaly and pulmonary vascular congestion. Basilar atelectasis. No change from prior.   Electronically Signed   By: Andreas Newport M.D.   On: 10/21/2014 18:41   Dg Chest 2 View  10/19/2014   CLINICAL DATA:  Shortness of breath, chest pain and anxiety for 4-5 days. Intermittent productive cough. History of cirrhosis, atrial fibrillation and cardiomyopathy.  EXAM: CHEST  2 VIEW  COMPARISON:  Chest radiograph October 07, 2014  FINDINGS: Cardiac silhouette appears mild to moderately enlarged. Mild central pulmonary vascular congestion. Mediastinal silhouette is nonsuspicious. Strandy densities RIGHT lung base, improved aeration with resolution of RIGHT pleural effusion present on prior imaging. Dual lead LEFT cardiac pacemaker in situ. No pneumothorax. Soft tissue planes and included osseous structures are nonsuspicious.  IMPRESSION: Mild to moderate cardiomegaly and central pulmonary vascular congestion.  RIGHT lung base strandy densities may reflect atelectasis or scarring without pleural effusion on today's examination.   Electronically Signed   By: Awilda Metro   On: 10/19/2014 22:04   Ct Angio Chest Pe W/cm &/or Wo  Cm  10/21/2014   CLINICAL DATA:  Hemoptysis. Chest pain and pressure. Weakness and shortness of breath. ICD10: R 0 4.2. History of pacer. COPD. Hepatitis-C.  EXAM: CT ANGIOGRAPHY CHEST WITH CONTRAST  TECHNIQUE: Multidetector CT imaging of the chest was performed using the standard protocol during bolus administration of intravenous contrast. Multiplanar CT image reconstructions and MIPs were obtained to evaluate the vascular anatomy.  CONTRAST:  OMNIPAQUE IOHEXOL 350  MG/ML SOLN  COMPARISON:  Plain films 10/21/2014.  Chest CT 03/10/2014  FINDINGS: Lungs/Pleura: Probable secretions within the non dependent trachea. Mild degradation secondary to patient size and motion. Right middle lobe 5 mm nodule on image this 72 of series 5 is likely similar to the prior exam. Patchy right lower lobe airspace disease with minimal right lower lobe nodularity. Mild left base subsegmental atelectasis. Scattered tiny pulmonary nodules are again identified.  Small right pleural effusion which is increased since the prior CT.  Heart/Mediastinum: The quality of this examination for evaluation of pulmonary embolism is good. The bolus is well timed. Minor limitations detailed above. No evidence of pulmonary embolism.  Increased number of small low jugular nodes, suboptimally evaluated. Moderate cardiomegaly with prior mitral valve repair. Pacer. No pericardial effusion. Pulmonary artery enlargement, with the outflow tract measuring 3.9 cm.  Increased number of small mediastinal nodes. A pretracheal node measures 1.2 cm on image 35 and is unchanged. No hilar adenopathy.  Upper Abdomen: Mild reflux of contrast into the hepatic veins and IVC. Prominent caudate lobe with irregular hepatic capsule. Normal imaged portions of the spleen, stomach, pancreas, adrenal glands, kidneys. Prominent porta hepatis nodes, including a gastrohepatic ligament node of 1.4 cm on image 103. This is also similar.  Bones/Musculoskeletal:  No acute osseous  abnormality.  Review of the MIP images confirms the above findings.  IMPRESSION: 1.  No evidence of pulmonary embolism. 2. Increased small right pleural effusion with patchy right lower lobe airspace disease. Although this could represent atelectasis, infection is favored. 3. Scattered pulmonary nodules, including a 5 mm right middle lobe nodule. Given history of COPD, followup and April of 2016 is indicated to confirm stability. This recommendation follows the consensus statement: Guidelines for Management of Small Pulmonary Nodules Detected on CT Scans: A Statement from the Fleischner Society as published in Radiology 2005; 237:395-400. 4. Mild thoracic and upper abdominal adenopathy. Recommend attention on follow-up. 5. Suspicion of cirrhosis. Upper abdominal adenopathy is likely secondary and reactive. 6. Pulmonary artery enlargement suggests pulmonary arterial hypertension. Elevated right heart pressures as evidenced by reflux of contrast into the IVC and hepatic veins.   Electronically Signed   By: Jeronimo Greaves M.D.   On: 10/21/2014 21:46   Dg Chest Portable 1 View  10/07/2014   CLINICAL DATA:  Shortness of breath and chest pain. Chest pain for 2 weeks.  EXAM: PORTABLE CHEST - 1 VIEW  COMPARISON:  09/23/2014  FINDINGS: Single view of the chest demonstrates blunting at the right costophrenic angle. Findings are compatible with a pleural effusion. Airspace disease at the right lung base has decreased or resolved. Again noted is a left cardiac pacemaker. Heart size is upper limits of normal but stable. Lung markings are prominent without frank pulmonary edema.  IMPRESSION: The airspace disease in the right lower lung has resolved or decreased but there is now increased pleural fluid at the right lung base. This pleural fluid could be complex or loculated based on the configuration.   Electronically Signed   By: Richarda Overlie M.D.   On: 10/07/2014 22:54    Microbiology: Recent Results (from the past 240  hour(s))  Culture, blood (routine x 2) Call MD if unable to obtain prior to antibiotics being given     Status: None (Preliminary result)   Collection Time: 10/22/14  1:20 AM  Result Value Ref Range Status   Specimen Description BLOOD LEFT HAND  Final   Special Requests BOTTLES DRAWN AEROBIC AND ANAEROBIC 6CC  Final   Culture NO GROWTH 3 DAYS  Final   Report Status PENDING  Incomplete  Culture, blood (routine x 2) Call MD if unable to obtain prior to antibiotics being given     Status: None (Preliminary result)   Collection Time: 10/22/14  1:35 AM  Result Value Ref Range Status   Specimen Description BLOOD RIGHT HAND  Final   Special Requests BOTTLES DRAWN AEROBIC AND ANAEROBIC [facility]7CC  Final   Culture NO GROWTH 3 DAYS  Final   Report Status PENDING  Incomplete     Labs: Basic Metabolic Panel:  Recent Labs Lab 10/19/14 2114 10/21/14 1850 10/22/14 0619 10/23/14 0600 10/24/14 0601 10/24/14 0609  NA 143 143 139 140  --  142  K 3.4* 4.2 4.8 3.8  --  3.7  CL 100 102 100 96  --  95*  CO2 33* 31 31 34*  --  35*  GLUCOSE 105* 88 150* 126*  --  99  BUN 7 12 11 14   --  16  CREATININE 0.85 0.83 0.83 0.96  --  0.89  CALCIUM 8.8 8.7 8.5 8.4  --  8.1*  MG  --   --   --   --  1.7  --    Liver Function Tests: No results for input(s): AST, ALT, ALKPHOS, BILITOT, PROT, ALBUMIN in the last 168 hours. No results for input(s): LIPASE, AMYLASE in the last 168 hours. No results for input(s): AMMONIA in the last 168 hours. CBC:  Recent Labs Lab 10/19/14 2114 10/21/14 1850 10/22/14 0619 10/23/14 0606 10/24/14 0609  WBC 7.2 9.7 7.8 12.4* 9.1  NEUTROABS 4.4 6.4 6.8  --   --   HGB 12.1 12.0 12.4 12.2 12.3  HCT 37.7 37.1 38.6 37.8 37.8  MCV 101.9* 101.6* 102.4* 101.3* 100.0  PLT 223 229 199 201 213   Cardiac Enzymes:  Recent Labs Lab 10/19/14 2114 10/21/14 1850 10/22/14 1420 10/24/14 0609  TROPONINI 0.53* 0.54* 0.36* 0.42*   BNP: BNP (last 3 results)  Recent Labs   07/23/14 1509 08/20/14 1540 10/07/14 1935  PROBNP 1639.0* 1380.0* 2314.0*   CBG:  Recent Labs Lab 10/23/14 2009 10/24/14 0756 10/24/14 1125 10/24/14 1634 10/24/14 2213  GLUCAP 113* 90 120* 204* 164*       Signed:  Quiera Diffee M  [name] 10/25/2014, 3:47 PM

## 2014-10-25 NOTE — Progress Notes (Signed)
ANTIBIOTIC CONSULT NOTE  Pharmacy Consult for Vancomycin & Cefepime Indication:  HCAP  Allergies  Allergen Reactions  . Nitroglycerin Other (See Comments)    CAUSED NUMBNESS ALL OVER  . Doxycycline Itching  . Flexeril [Cyclobenzaprine] Other (See Comments)    Sweating, Lightheaded   . Ibuprofen Other (See Comments)    Increase BP, dizziness, lightheaded  . Tramadol Itching  . Tylenol [Acetaminophen] Other (See Comments)    Pt has Hep C  . Vesicare [Solifenacin] Other (See Comments)    Makes my sweat turn yellow  . Aspirin Other (See Comments)    Cannot take due to liver function  . Hydralazine     Altered mental status, hallucinations, angression  . Amoxil [Amoxicillin] Nausea Only   Patient Measurements: Height: 5\' 9"  (175.3 cm) Weight: 238 lb 7.9 oz (108.181 kg) IBW/kg (Calculated) : 66.2 Adjusted Body Weight: 80 kg  Vital Signs: Temp: 97.8 F (36.6 C) (11/19 0558) Temp Source: Oral (11/19 0558) BP: 132/80 mmHg (11/19 0558) Pulse Rate: 75 (11/19 0558) Intake/Output from previous day: 11/18 0701 - 11/19 0700 In: 1230 [P.O.:680; IV Piggyback:550] Out: 700 [Urine:700] Intake/Output from this shift: Total I/O In: -  Out: 200 [Urine:200]  Labs:  Recent Labs  10/23/14 0600 10/23/14 0606 10/24/14 0609  WBC  --  12.4* 9.1  HGB  --  12.2 12.3  PLT  --  201 213  CREATININE 0.96  --  0.89   Estimated Creatinine Clearance: 100.2 mL/min (by C-G formula based on Cr of 0.89).   Microbiology: Recent Results (from the past 720 hour(s))  Culture, blood (routine x 2) Call MD if unable to obtain prior to antibiotics being given     Status: None (Preliminary result)   Collection Time: 10/22/14  1:20 AM  Result Value Ref Range Status   Specimen Description BLOOD LEFT HAND  Final   Special Requests BOTTLES DRAWN AEROBIC AND ANAEROBIC 6CC  Final   Culture NO GROWTH 2 DAYS  Final   Report Status PENDING  Incomplete  Culture, blood (routine x 2) Call MD if unable to  obtain prior to antibiotics being given     Status: None (Preliminary result)   Collection Time: 10/22/14  1:35 AM  Result Value Ref Range Status   Specimen Description BLOOD RIGHT HAND  Final   Special Requests BOTTLES DRAWN AEROBIC AND ANAEROBIC 7CC  Final   Culture NO GROWTH 2 DAYS  Final   Report Status PENDING  Incomplete   Medical History: Past Medical History  Diagnosis Date  . COPD (chronic obstructive pulmonary disease)     Home O2 3L  . Coronary atherosclerosis of native coronary artery     Cardiac catheterization 07/2012 - LAD 40%; small D2 with 60 to 70% ostial; OM1 30 to 40%; RCA 20%; PL1 40%   . Hypothyroidism   . Hepatitis C   . Pneumonia   . Cirrhosis   . Type 2 diabetes mellitus   . GERD (gastroesophageal reflux disease)   . Headache(784.0)   . Anxiety   . Atrial fibrillation and flutter   . Chronic pain   . PE (pulmonary embolism)   . Chronic anticoagulation   . History of medication noncompliance   . SSS (sick sinus syndrome)     Biotronik pacemaker  . Cardiomyopathy     LVEF 40-45%   Medications:  Scheduled:  . amLODipine  2.5 mg Oral Daily  . antiseptic oral rinse  7 mL Mouth Rinse BID  . ceFEPime (MAXIPIME)  IV  1 g Intravenous Q8H  . furosemide  80 mg Oral Daily  . ipratropium-albuterol  3 mL Nebulization Q6H  . mometasone-formoterol  2 puff Inhalation BID  . predniSONE  60 mg Oral QAC breakfast  . sodium chloride  3 mL Intravenous Q12H  . vancomycin  1,000 mg Intravenous Q12H   Assessment: 48yo female with hx COPD & frequent hospitalizations admitted with hemoptysis & possible HCAP.  She is currently on day#4 empiric broad-spectrum antibiotics with Cefepime and Vancomycin.   Patient is afebrile with normal WBC.  Some clinical improvement noted.  CXR + congestion. Renal function is stable at patient's baseline.  Vancomycin trough is at goal.  Vancomycin 11/16>> Cefepime 11/16>>  Goal of Therapy:  Vancomycin trough 15-88mcg/ml  Plan:  1.   Continue Vancomycin 1gm IV q12h 2.  Continue Cefepime 1gm IV q8h 3.  Weekly Vancomycin trough level 4.  Monitor renal function and cx data  5.  Duration of therapy per MD- de-escalate as appropriate  Tiwanna Tuch, [facility] 10/25/2014,8:23 AM

## 2014-10-25 NOTE — Progress Notes (Signed)
ANTICOAGULATION CONSULT NOTE - Initial Consult  Pharmacy Consult for Coumadin Indication: atrial fibrillation, hx PE  Allergies  Allergen Reactions  . Nitroglycerin Other (See Comments)    CAUSED NUMBNESS ALL OVER  . Doxycycline Itching  . Flexeril [Cyclobenzaprine] Other (See Comments)    Sweating, Lightheaded   . Ibuprofen Other (See Comments)    Increase BP, dizziness, lightheaded  . Tramadol Itching  . Tylenol [Acetaminophen] Other (See Comments)    Pt has Hep C  . Vesicare [Solifenacin] Other (See Comments)    Makes my sweat turn yellow  . Aspirin Other (See Comments)    Cannot take due to liver function  . Hydralazine     Altered mental status, hallucinations, angression  . Amoxil [Amoxicillin] Nausea Only    Patient Measurements: Height: 5\' 9"  (175.3 cm) Weight: 238 lb 7.9 oz (108.181 kg) IBW/kg (Calculated) : 66.2  Vital Signs: Temp: 97.8 F (36.6 C) (11/19 0558) Temp Source: Oral (11/19 0558) BP: 132/80 mmHg (11/19 0558) Pulse Rate: 75 (11/19 0558)  Labs:  Recent Labs  10/22/14 1420 10/23/14 0600 10/23/14 0606 10/24/14 0609 10/25/14 0553  HGB  --   --  12.2 12.3  --   HCT  --   --  37.8 37.8  --   PLT  --   --  201 213  --   LABPROT  --   --  26.0* 18.2* 15.5*  INR  --   --  2.36* 1.50* 1.22  CREATININE  --  0.96  --  0.89  --   TROPONINI 0.36*  --   --  0.42*  --     Estimated Creatinine Clearance: 100.2 mL/min (by C-G formula based on Cr of 0.89).   Medical History: Past Medical History  Diagnosis Date  . COPD (chronic obstructive pulmonary disease)     Home O2 3L  . Coronary atherosclerosis of native coronary artery     Cardiac catheterization 07/2012 - LAD 40%; small D2 with 60 to 70% ostial; OM1 30 to 40%; RCA 20%; PL1 40%   . Hypothyroidism   . Hepatitis C   . Pneumonia   . Cirrhosis   . Type 2 diabetes mellitus   . GERD (gastroesophageal reflux disease)   . Headache(784.0)   . Anxiety   . Atrial fibrillation and flutter   .  Chronic pain   . PE (pulmonary embolism)   . Chronic anticoagulation   . History of medication noncompliance   . SSS (sick sinus syndrome)     Biotronik pacemaker  . Cardiomyopathy     LVEF 40-45%    Medications:  Prescriptions prior to admission  Medication Sig Dispense Refill Last Dose  . albuterol (PROVENTIL HFA;VENTOLIN HFA) 108 (90 BASE) MCG/ACT inhaler Inhale 2 puffs into the lungs every 6 (six) hours as needed for wheezing or shortness of breath.    10/21/2014 at Unknown time  . amLODipine (NORVASC) 2.5 MG tablet Take 1 tablet (2.5 mg total) by mouth daily. 30 tablet 0 10/21/2014 at Unknown time  . budesonide-formoterol (SYMBICORT) 80-4.5 MCG/ACT inhaler Inhale 2 puffs into the lungs 2 (two) times daily.   10/21/2014 at Unknown time  . furosemide (LASIX) 80 MG tablet Take 1 tablet (80 mg total) by mouth daily. 30 tablet 2 10/21/2014 at Unknown time  . ipratropium-albuterol (DUONEB) 0.5-2.5 (3) MG/3ML SOLN Take 3 mLs by nebulization 3 (three) times daily.   10/21/2014 at Unknown time  . magnesium oxide (MAG-OX) 400 (241.3 MG) MG tablet Take 1  tablet (400 mg total) by mouth daily. 30 tablet 0 10/20/2014 at Unknown time  . metFORMIN (GLUCOPHAGE) 500 MG tablet Take 1 tablet (500 mg total) by mouth at bedtime. 30 tablet 1 Past Week at Unknown time  . omeprazole (PRILOSEC) 40 MG capsule Take 1 capsule (40 mg total) by mouth daily. 30 capsule 0 10/20/2014 at Unknown time  . potassium chloride 20 MEQ TBCR Take 20 mEq by mouth daily. Take with furosemide (Lasix) 30 tablet 2 10/21/2014 at Unknown time  . warfarin (COUMADIN) 4 MG tablet Take one tablet a day 20 tablet 0 10/21/2014 at Unknown time  . aspirin 325 MG tablet Take 1 tablet (325 mg total) by mouth daily. (Patient not taking: Reported on 10/21/2014)     . levofloxacin (LEVAQUIN) 250 MG tablet Take 1 tablet (250 mg total) by mouth daily. (Patient not taking: Reported on 10/21/2014) 3 tablet 0 10/07/2014 at Unknown time  . LORazepam  (ATIVAN) 1 MG tablet Take 0.5 tablets (0.5 mg total) by mouth 3 (three) times daily as needed for anxiety. 15 tablet 0 HAS NOT FILLED  . metoprolol succinate (TOPROL-XL) 25 MG 24 hr tablet Take 1 tablet (25 mg total) by mouth daily. (Patient not taking: Reported on 10/21/2014) 25 tablet 0 more than a month  . naproxen (NAPROSYN) 375 MG tablet Take 1 tablet (375 mg total) by mouth 2 (two) times daily as needed for mild pain or moderate pain. (Patient not taking: Reported on 10/21/2014) 10 tablet 0 10/07/2014 at Unknown time  . predniSONE (DELTASONE) 20 MG tablet Take 1 tablet (20 mg total) by mouth 2 (two) times daily with a meal. (Patient not taking: Reported on 10/21/2014) 5 tablet 0 prescription not filled at Unknown time  . Pseudoeph-Doxylamine-DM-APAP (NYQUIL PO) Take 2 capsules by mouth once as needed (for congestion).   Past Week at Unknown time  . QUEtiapine (SEROQUEL) 25 MG tablet Take 1 tablet (25 mg total) by mouth at bedtime. (Patient not taking: Reported on 10/21/2014) 30 tablet 1 more than a  month    Assessment: 47 yoF on chronic Coumadin.  Home dose listed above.  INR therapeutic on admission.  She was admitted with hemoptysis and Coumadin has been held since admission.  INR at baseline today.  Hemoptysis resolved.  Asked to resume Coumadin.   Goal of Therapy:  INR 2-3   Plan:  Coumadin 6mg  po x1 now Daily INR while hospitalized. Recommend resume previous Coumadin dose (4mg  daily) at discharge. INR monitoring as outpatient.  Elson Clan 10/25/2014,9:34 AM

## 2014-10-25 NOTE — Progress Notes (Signed)
Discharge teaching done.  Pt did not want to wait for O2 to be delivered and stated she would be ok without it.  Pt stable at time of discharge.  Pt meds returned to pt from pharmacy.  Pt taken via wheelchair to ED entrance to take taxi home.

## 2014-10-27 LAB — CULTURE, BLOOD (ROUTINE X 2)
CULTURE: NO GROWTH
Culture: NO GROWTH

## 2014-10-31 ENCOUNTER — Encounter: Payer: Self-pay | Admitting: *Deleted

## 2014-11-15 ENCOUNTER — Encounter (HOSPITAL_COMMUNITY): Payer: Self-pay | Admitting: Internal Medicine

## 2014-12-04 ENCOUNTER — Encounter: Payer: Self-pay | Admitting: *Deleted

## 2014-12-07 DEATH — deceased

## 2015-01-29 ENCOUNTER — Encounter: Payer: Self-pay | Admitting: *Deleted

## 2015-10-03 ENCOUNTER — Ambulatory Visit: Payer: Self-pay | Admitting: *Deleted

## 2015-10-03 DIAGNOSIS — I4891 Unspecified atrial fibrillation: Secondary | ICD-10-CM

## 2015-10-03 DIAGNOSIS — Z5181 Encounter for therapeutic drug level monitoring: Secondary | ICD-10-CM

## 2015-11-19 ENCOUNTER — Encounter: Payer: Self-pay | Admitting: *Deleted

## 2016-04-28 IMAGING — CR DG CHEST 2V
2 series · 2 of 2 positions shown · non-contrast
Comparison: 06/07/2014

CLINICAL DATA: Shortness of breath.  Chest pain.

EXAM:
CHEST  2 VIEW

[view not recorded (1 of 2)]
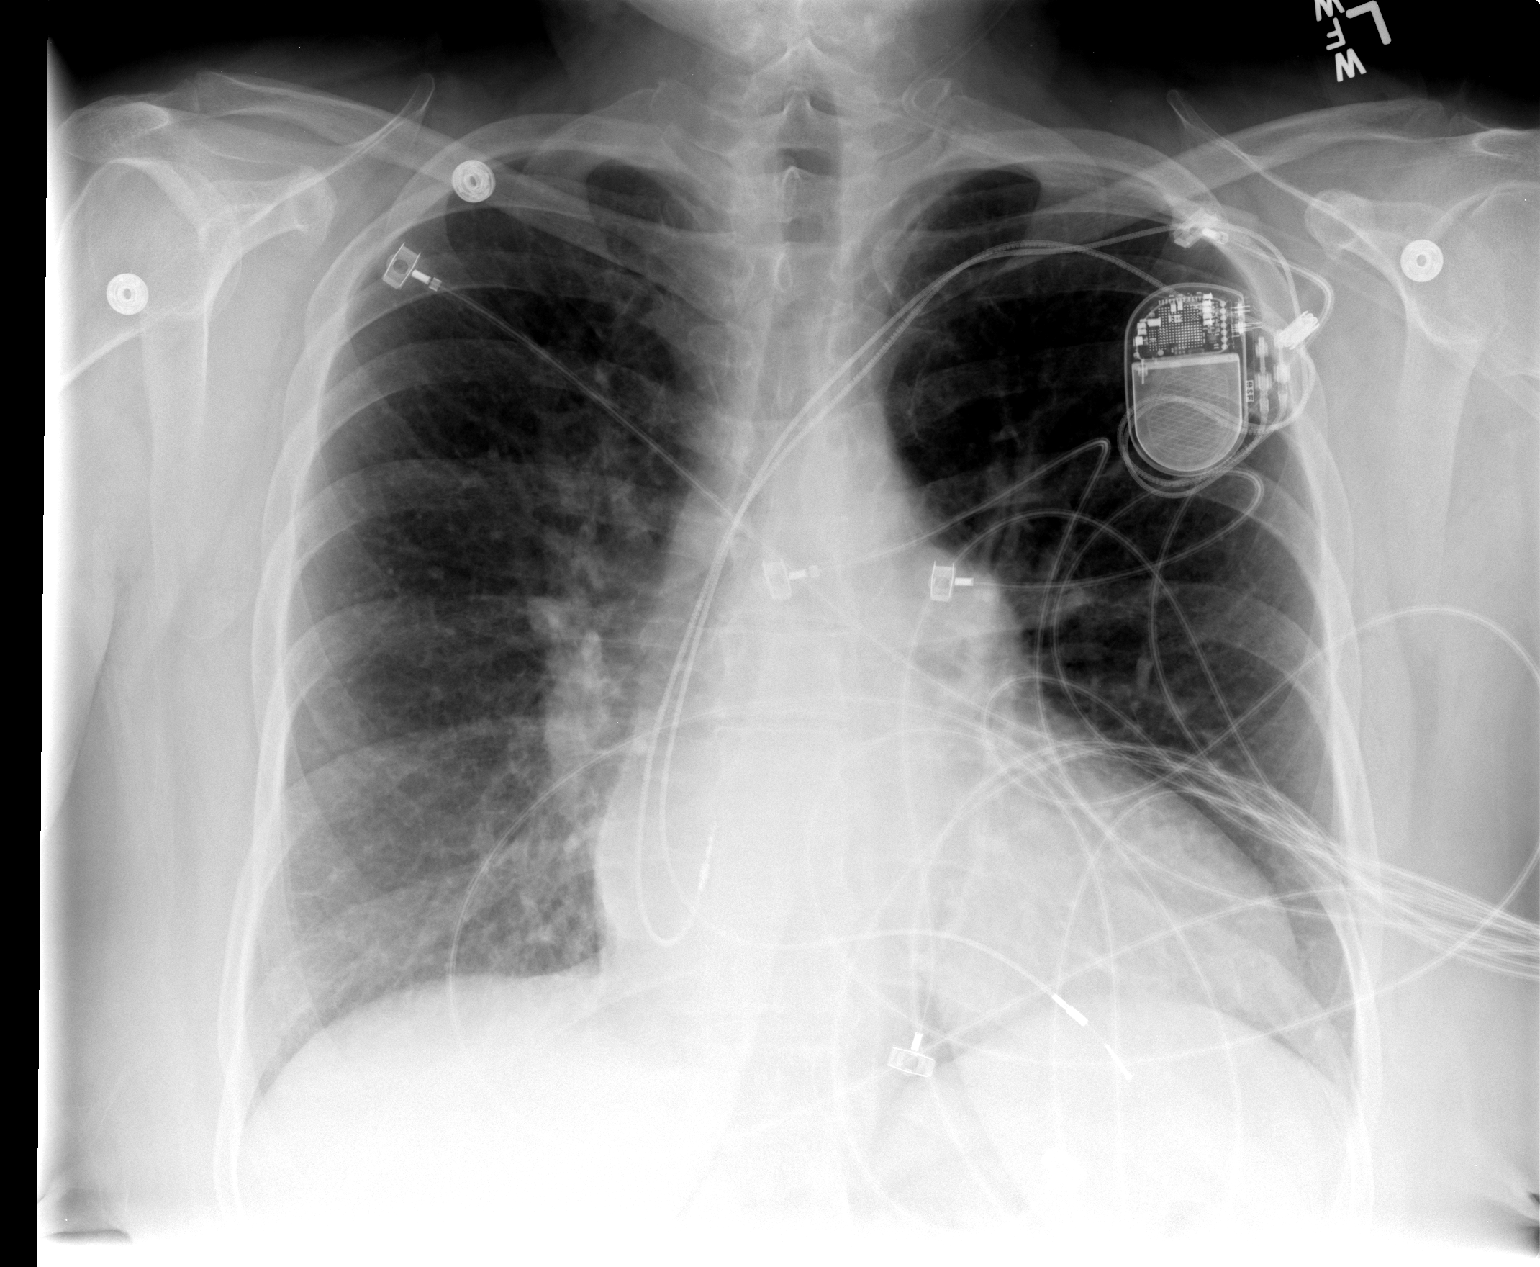

[view not recorded (2 of 2)]
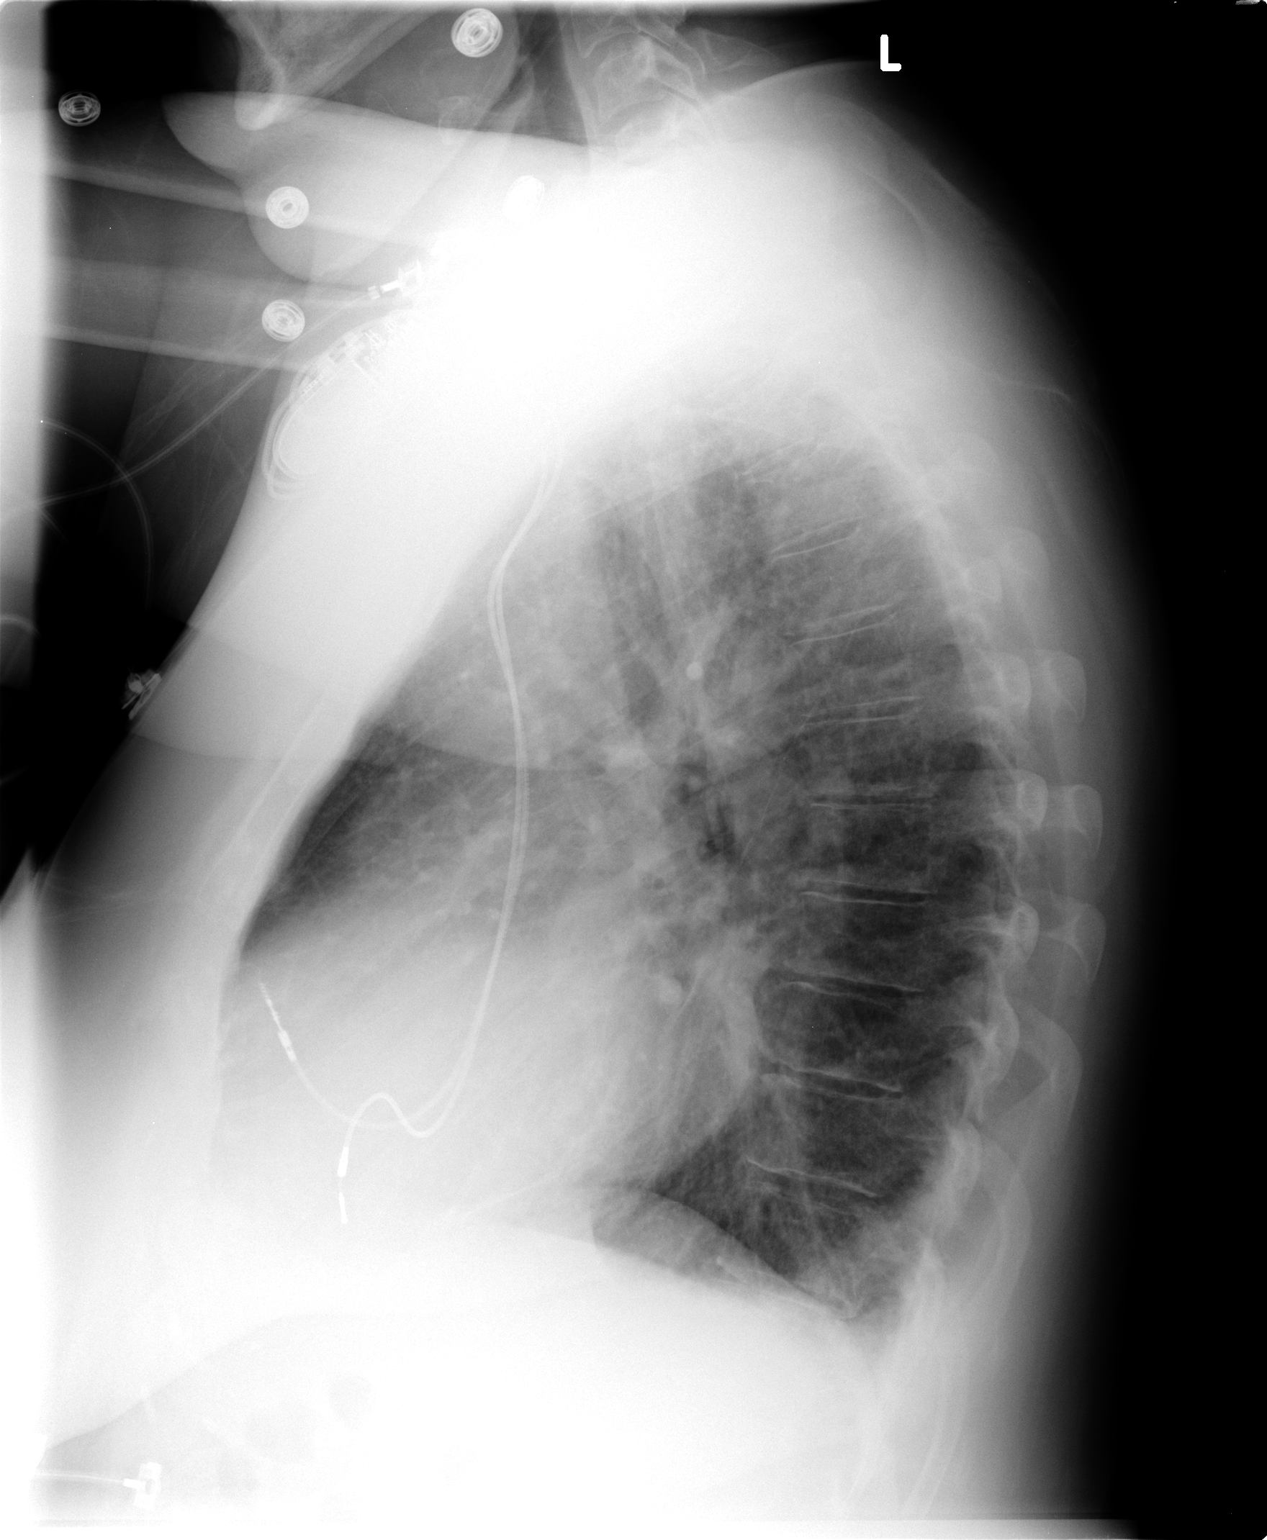

[2 of 2 positions shown; findings below may reference images not displayed]

FINDINGS: Cardiac pacemaker. Mild cardiac enlargement without vascular
congestion. Mild emphysematous changes in the lungs. No focal
airspace disease or consolidation. No blunting of costophrenic
angles. No pneumothorax. No significant change since prior study.
IMPRESSION: No active cardiopulmonary disease.

## 2016-04-30 IMAGING — NM NM MYOCAR SINGLE W/SPECT W/WALL MOTION & EF
2 series · 12 of 12 positions shown · non-contrast
Comparison: None.

CLINICAL DATA: 47-year-old woman with a history of coronary artery
disease and chest pain and repeated episodes of heart failure,
referred for an ischemic evaluation.

EXAM:
MYOCARDIAL IMAGING WITH SPECT (REST AND PHARMACOLOGIC-STRESS)
GATED LEFT VENTRICULAR WALL MOTION STUDY
LEFT VENTRICULAR EJECTION FRACTION
TECHNIQUE: Standard myocardial SPECT imaging was performed after resting
intravenous injection of 10 mCi Zc-99m sestamibi. Subsequently,
intravenous infusion of Lexiscan was performed under the supervision
of the Cardiology staff. At peak effect of the drug, 30 mCi Zc-99m
sestamibi was injected intravenously and standard myocardial SPECT
imaging was performed. Quantitative gated imaging was also performed
to evaluate left ventricular wall motion, and estimate left
ventricular ejection fraction.

[Series 1: rest · 8.28mm/px · 6 of 64 frames shown]
[frame 6/64]
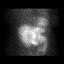
[frame 16/64]
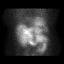
[frame 27/64]
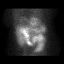
[frame 38/64]
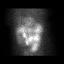
[frame 48/64]
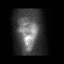
[frame 59/64]
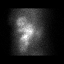

[Series 2: stress gated · 8.28mm/px · 6 of 64 frames shown]
[frame 6/64]
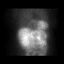
[frame 16/64]
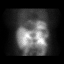
[frame 27/64]
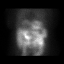
[frame 38/64]
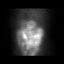
[frame 48/64]
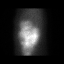
[frame 59/64]
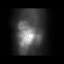

[12 of 12 positions shown; findings below may reference images not displayed]

FINDINGS: The patient was stressed according to the Lexiscan protocol. The
heart rate ranged from 73 up to 85 beats per min. The blood pressure
range from 120-125/76-85. No chest pain was reported.

Baseline tracings show a ventricular paced rhythm with no
significant changes with stress. There appears to be underlying
atrial flutter.

Analysis of the raw data shows no significant extracardiac
radiotracer uptake. Analysis of the perfusion images demonstrates a
small area of mildly decreased uptake in the apical anteroseptal and
mid anterior walls. There appears to be complete reversibility when
compared to the resting images. Regional wall motion is essentially
normal with the exception of abnormal septal motion which is due to
ventricular pacing. Therefore, the aforementioned defect may
represent a mild degree of apical and anterior wall ischemia versus
variable soft tissue attenuation artifact. Left ventricular systolic
function is normal, calculated LVEF 52%.
IMPRESSION: 1.  Low risk Lexiscan Cardiolite stress test.

2. Mildly reversible defect in the apical anterior and mid anterior
walls, which may represent a mild degree of ischemia vs variable
soft tissue attenuation artifact.

3.  Normal left ventricular systolic function, calculated LVEF 52%.

4.  No chest pain was reported.
